# Patient Record
Sex: Male | Born: 1945 | Race: Black or African American | Hispanic: No | Smoking: Former smoker
Health system: Southern US, Community
[De-identification: ages and names within clinical notes are randomized; demographics above are authoritative.]

## PROBLEM LIST (undated history)

## (undated) DIAGNOSIS — N529 Male erectile dysfunction, unspecified: Secondary | ICD-10-CM

## (undated) DIAGNOSIS — T07XXXA Unspecified multiple injuries, initial encounter: Secondary | ICD-10-CM

## (undated) DIAGNOSIS — I428 Other cardiomyopathies: Secondary | ICD-10-CM

## (undated) DIAGNOSIS — I509 Heart failure, unspecified: Secondary | ICD-10-CM

## (undated) DIAGNOSIS — M1710 Unilateral primary osteoarthritis, unspecified knee: Secondary | ICD-10-CM

## (undated) DIAGNOSIS — M25559 Pain in unspecified hip: Secondary | ICD-10-CM

## (undated) DIAGNOSIS — E785 Hyperlipidemia, unspecified: Secondary | ICD-10-CM

## (undated) DIAGNOSIS — E119 Type 2 diabetes mellitus without complications: Secondary | ICD-10-CM

## (undated) DIAGNOSIS — I10 Essential (primary) hypertension: Secondary | ICD-10-CM

## (undated) DIAGNOSIS — I739 Peripheral vascular disease, unspecified: Secondary | ICD-10-CM

## (undated) DIAGNOSIS — F411 Generalized anxiety disorder: Secondary | ICD-10-CM

## (undated) DIAGNOSIS — I5022 Chronic systolic (congestive) heart failure: Secondary | ICD-10-CM

## (undated) HISTORY — DX: Pain in unspecified hip: M25.559

## (undated) HISTORY — DX: Hyperlipidemia, unspecified: E78.5

## (undated) HISTORY — DX: Type 2 diabetes mellitus without complications: E11.9

## (undated) HISTORY — DX: Unilateral primary osteoarthritis, unspecified knee: M17.10

## (undated) HISTORY — DX: Peripheral vascular disease, unspecified: I73.9

## (undated) HISTORY — PX: OTHER SURGICAL HISTORY: SHX169

## (undated) HISTORY — DX: Essential (primary) hypertension: I10

## (undated) HISTORY — DX: Generalized anxiety disorder: F41.1

## (undated) HISTORY — DX: Chronic systolic (congestive) heart failure: I50.22

## (undated) HISTORY — DX: Male erectile dysfunction, unspecified: N52.9

## (undated) HISTORY — DX: Other cardiomyopathies: I42.8

---

## 2003-09-03 ENCOUNTER — Emergency Department (HOSPITAL_COMMUNITY): Admission: EM | Admit: 2003-09-03 | Discharge: 2003-09-03 | Payer: Self-pay | Admitting: Emergency Medicine

## 2007-04-09 ENCOUNTER — Emergency Department (HOSPITAL_COMMUNITY): Admission: EM | Admit: 2007-04-09 | Discharge: 2007-04-09 | Payer: Self-pay | Admitting: Emergency Medicine

## 2008-10-08 ENCOUNTER — Ambulatory Visit: Payer: Self-pay | Admitting: Internal Medicine

## 2008-10-08 DIAGNOSIS — M1710 Unilateral primary osteoarthritis, unspecified knee: Secondary | ICD-10-CM

## 2008-10-08 DIAGNOSIS — I10 Essential (primary) hypertension: Secondary | ICD-10-CM

## 2008-10-08 DIAGNOSIS — R5383 Other fatigue: Secondary | ICD-10-CM

## 2008-10-08 DIAGNOSIS — E739 Lactose intolerance, unspecified: Secondary | ICD-10-CM

## 2008-10-08 DIAGNOSIS — IMO0002 Reserved for concepts with insufficient information to code with codable children: Secondary | ICD-10-CM

## 2008-10-08 DIAGNOSIS — R5381 Other malaise: Secondary | ICD-10-CM

## 2008-10-08 HISTORY — DX: Essential (primary) hypertension: I10

## 2008-10-08 HISTORY — DX: Reserved for concepts with insufficient information to code with codable children: IMO0002

## 2008-11-07 ENCOUNTER — Ambulatory Visit: Payer: Self-pay | Admitting: Internal Medicine

## 2008-11-07 DIAGNOSIS — E119 Type 2 diabetes mellitus without complications: Secondary | ICD-10-CM

## 2008-11-07 DIAGNOSIS — E785 Hyperlipidemia, unspecified: Secondary | ICD-10-CM

## 2008-11-07 DIAGNOSIS — N529 Male erectile dysfunction, unspecified: Secondary | ICD-10-CM | POA: Insufficient documentation

## 2008-11-07 HISTORY — DX: Hyperlipidemia, unspecified: E78.5

## 2008-11-07 HISTORY — DX: Male erectile dysfunction, unspecified: N52.9

## 2008-11-07 HISTORY — DX: Type 2 diabetes mellitus without complications: E11.9

## 2008-11-12 ENCOUNTER — Ambulatory Visit: Payer: Self-pay | Admitting: Gastroenterology

## 2008-11-23 ENCOUNTER — Ambulatory Visit: Payer: Self-pay | Admitting: Gastroenterology

## 2008-11-23 ENCOUNTER — Encounter: Payer: Self-pay | Admitting: Internal Medicine

## 2008-11-23 ENCOUNTER — Encounter: Payer: Self-pay | Admitting: Gastroenterology

## 2008-11-23 LAB — HM COLONOSCOPY

## 2008-11-27 ENCOUNTER — Encounter: Payer: Self-pay | Admitting: Gastroenterology

## 2009-02-04 ENCOUNTER — Ambulatory Visit: Payer: Self-pay | Admitting: Internal Medicine

## 2009-02-04 DIAGNOSIS — F411 Generalized anxiety disorder: Secondary | ICD-10-CM | POA: Insufficient documentation

## 2009-02-04 DIAGNOSIS — M79609 Pain in unspecified limb: Secondary | ICD-10-CM

## 2009-02-04 HISTORY — DX: Generalized anxiety disorder: F41.1

## 2009-05-08 ENCOUNTER — Ambulatory Visit: Payer: Self-pay | Admitting: Internal Medicine

## 2009-05-08 LAB — CONVERTED CEMR LAB
BUN: 17 mg/dL (ref 6–23)
Cholesterol: 211 mg/dL — ABNORMAL HIGH (ref 0–200)
GFR calc non Af Amer: 108.69 mL/min (ref >60)
Glucose, Bld: 114 mg/dL — ABNORMAL HIGH (ref 70–99)
HDL: 35.5 mg/dL — ABNORMAL LOW (ref >39.00)
Hgb A1c MFr Bld: 5.9 % (ref 4.6–6.5)
Potassium: 4.8 meq/L (ref 3.5–5.1)
Sodium: 139 meq/L (ref 135–145)
Total CHOL/HDL Ratio: 6
VLDL: 28.2 mg/dL (ref 0.0–40.0)

## 2009-11-06 ENCOUNTER — Ambulatory Visit: Payer: Self-pay | Admitting: Internal Medicine

## 2009-11-06 DIAGNOSIS — M25559 Pain in unspecified hip: Secondary | ICD-10-CM

## 2009-11-06 HISTORY — DX: Pain in unspecified hip: M25.559

## 2010-05-14 ENCOUNTER — Ambulatory Visit: Admit: 2010-05-14 | Payer: Self-pay | Admitting: Internal Medicine

## 2010-05-18 LAB — CONVERTED CEMR LAB
ALT: 50 units/L (ref 0–53)
Albumin: 3.9 g/dL (ref 3.5–5.2)
Alkaline Phosphatase: 81 units/L (ref 39–117)
Basophils Absolute: 0 10*3/uL (ref 0.0–0.1)
Basophils Relative: 0 % (ref 0.0–3.0)
Bilirubin, Direct: 0.1 mg/dL (ref 0.0–0.3)
CO2: 28 meq/L (ref 19–32)
Chloride: 105 meq/L (ref 96–112)
Cholesterol: 195 mg/dL (ref 0–200)
Creatinine, Ser: 0.9 mg/dL (ref 0.4–1.5)
GFR calc non Af Amer: 108.88 mL/min (ref >60)
HCT: 42.8 % (ref 39.0–52.0)
Hemoglobin, Urine: NEGATIVE
Hemoglobin: 15.1 g/dL (ref 13.0–17.0)
LDL Cholesterol: 115 mg/dL — ABNORMAL HIGH (ref 0–99)
Leukocytes, UA: NEGATIVE
Lymphs Abs: 1.7 10*3/uL (ref 0.7–4.0)
MCHC: 35.3 g/dL (ref 30.0–36.0)
Microalb Creat Ratio: 11.3 mg/g (ref 0.0–30.0)
Microalb, Ur: 1.6 mg/dL (ref 0.0–1.9)
Neutro Abs: 2.9 10*3/uL (ref 1.4–7.7)
Nitrite: NEGATIVE
PSA: 1.96 ng/mL (ref 0.10–4.00)
Platelets: 203 10*3/uL (ref 150.0–400.0)
Potassium: 4.7 meq/L (ref 3.5–5.1)
RDW: 13.2 % (ref 11.5–14.6)
TSH: 1.08 microintl units/mL (ref 0.35–5.50)
Total Bilirubin: 1 mg/dL (ref 0.3–1.2)
Total CHOL/HDL Ratio: 5
Total Protein: 7.1 g/dL (ref 6.0–8.3)
Urine Glucose: NEGATIVE mg/dL
Urobilinogen, UA: 0.2 (ref 0.0–1.0)
VLDL: 39.4 mg/dL (ref 0.0–40.0)
Vitamin B-12: 329 pg/mL (ref 211–911)

## 2010-05-22 NOTE — Assessment & Plan Note (Signed)
Summary: 3 mo rov /nws   Vital Signs:  Patient profile:   65 year old male Height:      71 inches Weight:      224 pounds BMI:     31.35 O2 Sat:      96 % on Room air Temp:     98 degrees F oral Pulse rate:   73 / minute BP sitting:   140 / 90  (left arm) Cuff size:   large  Vitals Entered ByMarland Kitchen Zella Ball Ewing (May 08, 2009 9:51 AM)  O2 Flow:  Room air  Preventive Care Screening     decllned flu shot  CC: 3 Mo ROV/RE   CC:  3 Mo ROV/RE.  History of Present Illness: overall doing well, wt is down from 235 to 224 with better diet and less ETOH - now down to 2  beers per day;  Pt denies CP, sob, doe, wheezing, orthopnea, pnd, worsening LE edema, palps, dizziness or syncope   Pt denies new neuro symptoms such as headache, facial or extremity weakness   Pt denies polydipsia, polyuria, or low sugar symptoms such as shakiness improved with eating.  Overall good compliance with meds, trying to follow low chol, DM diet, wt stable, little excercise however .  He c/o viagra costing too much and would like the rx with the 3 free cialis today to try.   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 1 By Mouth Once Daily 2)  Aspir-Low 81 Mg Tbec (Aspirin) .Marland Kitchen.. 1 By  Mouth Once Daily 3)  Glucosamine-Chondroitin-Msm 500-250-250 Mg Caps (Glucosamine-Chondroitin-Msm) .... 3 By Mouth Once Daily 4)  Viagra 100 Mg Tabs (Sildenafil Citrate) .Marland Kitchen.. 1 By Mouth Every Other Day As Needed 5)  Losartan Potassium 100 Mg Tabs (Losartan Potassium) .Marland Kitchen.. 1 By Mouth Once Daily 6)  Citalopram Hydrobromide 10 Mg Tabs (Citalopram Hydrobromide) .Marland Kitchen.. 1 By Mouth Once Daily  Current Medications (verified): 1)  Amlodipine Besylate 10 Mg Tabs (Amlodipine Besylate) .Marland Kitchen.. 1 By Mouth Once Daily 2)  Aspir-Low 81 Mg Tbec (Aspirin) .Marland Kitchen.. 1 By Mouth Once Daily 3)  Glucosamine-Chondroitin-Msm 500-250-250 Mg Caps (Glucosamine-Chondroitin-Msm) .... 3 By Mouth Once Daily 4)  Cialis 20 Mg Tabs (Tadalafil) .Marland Kitchen.. 1 By Mouth Every Other Day As Needed 5)  Losartan Potassium 100 Mg Tabs (Losartan Potassium) .Marland Kitchen.. 1 By Mouth Once Daily 6)  Citalopram Hydrobromide 10 Mg Tabs (Citalopram Hydrobromide) .Marland Kitchen.. 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 Alcohol use-yes - weekends - up to half case 10th grade education - poor reading and writing ability incarcerated for 6 mo in 2009  Risk Factors: Smoking Status: quit (10/08/2008)  Review of Systems  The patient denies anorexia, fever, weight gain, vision loss, decreased hearing, hoarseness, chest pain, syncope, dyspnea on exertion, peripheral edema, prolonged cough, headaches, hemoptysis, abdominal pain, melena, hematochezia, severe indigestion/heartburn, hematuria,  incontinence, muscle weakness, suspicious skin lesions, transient blindness, difficulty walking, depression, unusual weight change, abnormal bleeding, enlarged lymph nodes, and angioedema.         all otherwise negative per pt - 12 system review done  Physical Exam  General:  alert and overweight-appearing.   Head:  normocephalic and atraumatic.   Eyes:  vision grossly intact, pupils equal, and pupils round.   Ears:  R ear normal and L ear normal.   Nose:  no external deformity and no nasal discharge.   Mouth:  no gingival abnormalities and pharynx pink and moist.   Neck:  supple and no masses.   Lungs:  normal respiratory effort and normal breath sounds.   Heart:  normal rate and regular rhythm.   Abdomen:  soft, non-tender, and normal bowel sounds.   Msk:  no joint tenderness and no joint swelling.   Extremities:  no edema, no erythema  Neurologic:  cranial nerves II-XII intact and strength normal in all extremities.     Impression & Recommendations:  Problem # 1:  Preventive Health Care (ICD-V70.0) Overall doing well, updated the age appropriate counseling and education;  routine health screening/prevention reviewed and done as appropriate unless declined, immunizations up to date or declined, diet counseling done if overweight, urged to quit smoking if smokes , most recent labs reviewed and current ordered if appropriate, ecg reviewed or declined  Problem # 2:  DIABETES MELLITUS, TYPE II (ICD-250.00)  His updated medication list for this problem includes:    Aspir-low 81 Mg Tbec (Aspirin) .Marland Kitchen... 1 by mouth once daily    Losartan Potassium 100 Mg Tabs (Losartan potassium) .Marland Kitchen... 1 by mouth once daily  Orders: TLB-BMP (Basic Metabolic Panel-BMET) (80048-METABOL) TLB-A1C / Hgb A1C (Glycohemoglobin) (83036-A1C) TLB-Lipid Panel (80061-LIPID)  Labs Reviewed: Creat: 0.9 (10/08/2008)    Reviewed HgBA1c results: 6.5 (10/08/2008) stable overall by hx and exam, ok to continue meds/tx  as is, does not require OHA at this time, Pt to cont DM diet, excercise, wt loss efforts; to check labs today   Problem # 3:  HYPERTENSION (ICD-401.9)  His updated medication list for this problem includes:    Amlodipine Besylate 10 Mg Tabs (Amlodipine besylate) .Marland Kitchen... 1 by mouth once daily    Losartan Potassium 100 Mg Tabs (Losartan potassium) .Marland Kitchen... 1 by mouth once daily  BP today: 140/90 Prior BP: 156/86 (02/04/2009)  Labs Reviewed: K+: 4.7 (10/08/2008) Creat: : 0.9 (10/08/2008)   Chol: 195 (10/08/2008)   HDL: 40.40 (10/08/2008)   LDL: 115 (10/08/2008)   TG: 197.0 (10/08/2008) stable overall by hx and exam, ok to continue meds/tx as is   Problem # 4:  HYPERLIPIDEMIA (ICD-272.4) stable overall by hx and exam, ok to continue meds/tx as is ; Pt to continue diet efforts, ; to check labs - goal LDL less than 70  Labs Reviewed: SGOT: 45 (10/08/2008)   SGPT: 50 (10/08/2008)   HDL:40.40 (10/08/2008)  LDL:115 (10/08/2008)  Chol:195 (10/08/2008)  Trig:197.0 (10/08/2008)  Problem # 5:  ERECTILE DYSFUNCTION, ORGANIC (ICD-607.84)  His updated  medication list for this problem includes:    Cialis 20 Mg Tabs (Tadalafil) .Marland Kitchen... 1 by mouth every other day as needed treat as above, f/u any worsening signs or symptoms   Complete Medication List: 1)  Amlodipine Besylate 10 Mg Tabs (Amlodipine besylate) .Marland Kitchen.. 1 by mouth once daily 2)  Aspir-low 81 Mg Tbec (Aspirin) .Marland Kitchen.. 1 by mouth once daily 3)  Glucosamine-chondroitin-msm 500-250-250 Mg Caps (Glucosamine-chondroitin-msm) .... 3 by mouth once daily 4)  Cialis 20 Mg Tabs (Tadalafil) .Marland Kitchen.. 1 by mouth every other day as needed 5)  Losartan Potassium 100 Mg Tabs (Losartan potassium) .Marland Kitchen.. 1 by mouth once daily 6)  Citalopram Hydrobromide 10 Mg Tabs (Citalopram hydrobromide) .Marland Kitchen.. 1 by mouth once daily  Patient Instructions: 1)  Please take all new medications as prescribed 2)  Continue all previous medications as before this visit  3)  Please go to the Lab  in the basement for your blood and/or urine tests today 4)  Please schedule a follow-up appointment in 6 months. Prescriptions: CIALIS 20 MG TABS (TADALAFIL) 1 by mouth every other day as needed  #3 x 11   Entered and Authorized by:   [name] MD   Signed by:   [name] MD on 05/08/2009   Method used:   Print then Give to Patient   RxID:   [id number]

## 2010-05-22 NOTE — Assessment & Plan Note (Signed)
Summary: 6 MO ROV /NWS   Vital Signs:  Patient profile:   65 year old male Height:      73 inches Weight:      229.50 pounds BMI:     30.39 O2 Sat:      97 % on Room air Temp:     98.2 degrees F oral Pulse rate:   72 / minute BP sitting:   138 / 80  (left arm) Cuff size:   large  Vitals Entered By: Zella Ball Ewing CMA (AAMA) (November 06, 2009 1:28 PM)  O2 Flow:  Room air  CC: 6 month ROV/RE   CC:  6 month ROV/RE.  History of Present Illness: here to f/u in general, not taking the simvastatin  - just wasnt sure about it;  does c/o some right hip area stiffness and pain intermittent to sit more than 5 min such as on the commode;  no back pain, or LE pian/weak/numb, bowel or bladder changes, or fall or gait change or injury.  Pt denies CP, sob, doe, wheezing, orthopnea, pnd, worsening LE edema, palps, dizziness or syncope Pt denies new neuro symptoms such as headache, facial or extremity weakness  No fever, wt loss, night sweats, loss of appetite or other constitutional symptoms  gained 5 lbs in the past 6 mo due to less excercise.  Pt denies polydipsia, polyuria, or low sugar symptoms such as shakiness improved with eating.  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 1 By Mouth Once Daily 2)  Aspir-Low 81 Mg Tbec (Aspirin) .Marland Kitchen.. 1 By Mouth Once Daily 3)  Glucosamine-Chondroitin-Msm 500-250-250 Mg  Caps (Glucosamine-Chondroitin-Msm) .... 3 By Mouth Once Daily 4)  Cialis 20 Mg Tabs (Tadalafil) .Marland Kitchen.. 1 By Mouth Every Other Day As Needed 5)  Losartan Potassium 100 Mg Tabs (Losartan Potassium) .Marland Kitchen.. 1 By Mouth Once Daily 6)  Citalopram Hydrobromide 10 Mg Tabs (Citalopram Hydrobromide) .Marland Kitchen.. 1 By Mouth Once Daily 7)  Simvastatin 40 Mg Tabs (Simvastatin) .Marland Kitchen.. 1po Once Daily  Current Medications (verified): 1)  Amlodipine Besylate 10 Mg Tabs (Amlodipine Besylate) .Marland Kitchen.. 1 By Mouth Once Daily 2)  Aspir-Low 81 Mg Tbec (Aspirin) .Marland Kitchen.. 1 By Mouth Once Daily 3)  Glucosamine-Chondroitin-Msm 500-250-250 Mg Caps (Glucosamine-Chondroitin-Msm) .... 3 By Mouth Once Daily 4)  Cialis 20 Mg Tabs (Tadalafil) .Marland Kitchen.. 1 By Mouth Every Other Day As Needed 5)  Losartan Potassium 100 Mg Tabs (Losartan Potassium) .Marland Kitchen.. 1 By Mouth Once Daily 6)  Citalopram Hydrobromide 10 Mg Tabs (Citalopram Hydrobromide) .Marland Kitchen.. 1 By Mouth Once Daily 7)  Simvastatin 40 Mg Tabs (Simvastatin) .Marland Kitchen.. 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   Head:  normocephalic and atraumatic.   Eyes:  vision grossly intact, pupils equal, and pupils round.   Ears:  R ear normal and L ear normal.   Nose:  no external deformity and no nasal  discharge.   Mouth:  no gingival abnormalities and pharynx pink and moist.  , poor dentition Neck:  supple and no masses.   Lungs:  normal respiratory effort and normal breath sounds.   Heart:  normal rate and regular rhythm.   Abdomen:  soft, non-tender, and normal bowel sounds.   Msk:  no tender over greater trochanter or lumbar area on the right, has FROM of right hip Extremities:  no edema, no erythema  Neurologic:  vision poor today, not addressed formally today, gait normal   Impression & Recommendations:  Problem # 1:  HYPERLIPIDEMIA (ICD-272.4)  His updated medication list for this problem includes:    Simvastatin 40 Mg Tabs (Simvastatin) .Marland Kitchen... 1po once daily  Labs Reviewed: SGOT: 45 (10/08/2008)   SGPT: 50 (10/08/2008)   HDL:35.50 (05/08/2009), 40.40 (10/08/2008)  LDL:115 (10/08/2008)  Chol:211 (05/08/2009), 195 (10/08/2008)  Trig:141.0 (05/08/2009), 197.0 (10/08/2008) d/w pt - not taking med, encouraged compliance, pt states will take - for new rx today  Problem # 2:  HIP PAIN, RIGHT (ICD-719.45)  His updated medication list for this problem includes:    Aspir-low 81 Mg Tbec (Aspirin) .Marland Kitchen... 1 by mouth once daily exam benign, pt declines film - Continue all previous medications as before this visit  - tylenol as needed   Problem # 3:  DIABETES MELLITUS, TYPE II (ICD-250.00)  His updated medication list for this problem includes:    Aspir-low 81 Mg Tbec (Aspirin) .Marland Kitchen... 1 by mouth once daily    Losartan Potassium 100 Mg Tabs (Losartan potassium) .Marland Kitchen... 1 by mouth once daily  Labs Reviewed: Creat: 0.9 (05/08/2009)    Reviewed HgBA1c results: 5.9 (05/08/2009)  6.5 (10/08/2008) stable overall by hx and exam, ok to continue meds/tx as is - no med needed at this time  Problem # 4:  HYPERTENSION (ICD-401.9)  His updated medication list for this problem includes:    Amlodipine Besylate 10 Mg Tabs (Amlodipine besylate) .Marland Kitchen... 1 by mouth once daily    Losartan Potassium 100  Mg Tabs (Losartan potassium) .Marland Kitchen... 1 by mouth once daily  BP today: 138/80 Prior BP: 140/90 (05/08/2009)  Labs Reviewed: K+: 4.8 (05/08/2009) Creat: : 0.9 (05/08/2009)   Chol: 211 (05/08/2009)   HDL: 35.50 (05/08/2009)   LDL: 115 (10/08/2008)   TG: 141.0 (05/08/2009) stable overall by hx and exam, ok to continue meds/tx as is   Complete Medication List: 1)  Amlodipine Besylate 10 Mg Tabs (Amlodipine besylate) .Marland Kitchen.. 1 by mouth once daily 2)  Aspir-low 81 Mg Tbec (Aspirin) .Marland Kitchen.. 1 by mouth once daily 3)  Glucosamine-chondroitin-msm 500-250-250 Mg Caps (Glucosamine-chondroitin-msm) .... 3 by mouth once daily 4)  Cialis 20 Mg Tabs (Tadalafil) .Marland Kitchen.. 1 by mouth every other day as needed 5)  Losartan Potassium 100 Mg Tabs (Losartan potassium) .Marland Kitchen.. 1 by mouth once daily 6)  Citalopram Hydrobromide 10  Mg Tabs (Citalopram hydrobromide) .Marland Kitchen.. 1 by mouth once daily 7)  Simvastatin 40 Mg Tabs (Simvastatin) .Marland Kitchen.. 1po once daily  Patient Instructions: 1)  Please take all new medications as prescribed - the new medication for cholesterol 2)  Please make appt with your eye doctor at least once per year, and your dentist every 6 months 3)  Continue all previous medications as before this visit  4)  Please schedule a follow-up appointment in 6 months for your "yearly medicare exam", or sooner if needed Prescriptions: SIMVASTATIN 40 MG TABS (SIMVASTATIN) 1po once daily  #90 x 3   Entered and Authorized by:   [name] MD   Signed by:   [name] MD on 11/06/2009   Method used:   Faxed to ...       [facility] (retail)       [address]       [place], [place]  [address]       Ph: [phone]       Fax: [phone]   RxID:   [id number]

## 2010-06-10 ENCOUNTER — Emergency Department (HOSPITAL_COMMUNITY): Payer: Medicare Other

## 2010-06-10 ENCOUNTER — Emergency Department (HOSPITAL_COMMUNITY)
Admission: EM | Admit: 2010-06-10 | Discharge: 2010-06-10 | Disposition: A | Discharge status: Home / Self Care | Payer: Medicare Other | Attending: Emergency Medicine | Admitting: Emergency Medicine

## 2010-06-10 DIAGNOSIS — M47812 Spondylosis without myelopathy or radiculopathy, cervical region: Secondary | ICD-10-CM | POA: Insufficient documentation

## 2010-06-10 DIAGNOSIS — W1809XA Striking against other object with subsequent fall, initial encounter: Secondary | ICD-10-CM | POA: Insufficient documentation

## 2010-06-10 DIAGNOSIS — R079 Chest pain, unspecified: Secondary | ICD-10-CM | POA: Insufficient documentation

## 2010-06-10 DIAGNOSIS — R51 Headache: Secondary | ICD-10-CM | POA: Insufficient documentation

## 2010-06-10 DIAGNOSIS — M25519 Pain in unspecified shoulder: Secondary | ICD-10-CM | POA: Insufficient documentation

## 2010-06-10 DIAGNOSIS — I4949 Other premature depolarization: Secondary | ICD-10-CM | POA: Insufficient documentation

## 2010-06-10 DIAGNOSIS — Y92009 Unspecified place in unspecified non-institutional (private) residence as the place of occurrence of the external cause: Secondary | ICD-10-CM | POA: Insufficient documentation

## 2010-06-10 DIAGNOSIS — IMO0002 Reserved for concepts with insufficient information to code with codable children: Secondary | ICD-10-CM | POA: Insufficient documentation

## 2010-06-10 LAB — CBC
Hemoglobin: 14.7 g/dL (ref 13.0–17.0)
MCV: 99.6 fL (ref 78.0–100.0)
RBC: 4.5 MIL/uL (ref 4.22–5.81)
WBC: 5.7 10*3/uL (ref 4.0–10.5)

## 2010-06-10 LAB — URINALYSIS, ROUTINE W REFLEX MICROSCOPIC
Protein, ur: NEGATIVE mg/dL
Specific Gravity, Urine: 1.026 (ref 1.005–1.030)
Urobilinogen, UA: 2 mg/dL — ABNORMAL HIGH (ref 0.0–1.0)
pH: 6 (ref 5.0–8.0)

## 2010-06-10 LAB — DIFFERENTIAL
Basophils Absolute: 0 10*3/uL (ref 0.0–0.1)
Eosinophils Absolute: 0.1 10*3/uL (ref 0.0–0.7)
Eosinophils Relative: 1 % (ref 0–5)
Neutro Abs: 2.9 10*3/uL (ref 1.7–7.7)

## 2010-06-10 LAB — COMPREHENSIVE METABOLIC PANEL
ALT: 33 U/L (ref 0–53)
BUN: 16 mg/dL (ref 6–23)
CO2: 27 mEq/L (ref 19–32)
Creatinine, Ser: 1.02 mg/dL (ref 0.4–1.5)
GFR calc Af Amer: >60 mL/min (ref >60)
GFR calc non Af Amer: >60 mL/min (ref >60)
Glucose, Bld: 112 mg/dL — ABNORMAL HIGH (ref 70–99)
Sodium: 138 mEq/L (ref 135–145)
Total Bilirubin: 1.3 mg/dL — ABNORMAL HIGH (ref 0.3–1.2)

## 2010-06-10 LAB — ETHANOL: Alcohol, Ethyl (B): <5 mg/dL (ref 0–10)

## 2010-08-17 ENCOUNTER — Encounter: Payer: Self-pay | Admitting: Internal Medicine

## 2010-08-17 DIAGNOSIS — R7302 Impaired glucose tolerance (oral): Secondary | ICD-10-CM | POA: Insufficient documentation

## 2010-08-17 DIAGNOSIS — Z0000 Encounter for general adult medical examination without abnormal findings: Secondary | ICD-10-CM | POA: Insufficient documentation

## 2010-08-20 ENCOUNTER — Ambulatory Visit (INDEPENDENT_AMBULATORY_CARE_PROVIDER_SITE_OTHER): Payer: Medicare Other | Admitting: Internal Medicine

## 2010-08-20 ENCOUNTER — Encounter: Payer: Self-pay | Admitting: Internal Medicine

## 2010-08-20 ENCOUNTER — Other Ambulatory Visit (INDEPENDENT_AMBULATORY_CARE_PROVIDER_SITE_OTHER): Payer: Medicare Other | Admitting: Internal Medicine

## 2010-08-20 ENCOUNTER — Other Ambulatory Visit (INDEPENDENT_AMBULATORY_CARE_PROVIDER_SITE_OTHER): Payer: Medicare Other

## 2010-08-20 VITALS — BP 134/82 | HR 88 | Temp 98.9°F | Ht 73.0 in | Wt 225.5 lb

## 2010-08-20 DIAGNOSIS — M25559 Pain in unspecified hip: Secondary | ICD-10-CM

## 2010-08-20 DIAGNOSIS — I10 Essential (primary) hypertension: Secondary | ICD-10-CM

## 2010-08-20 DIAGNOSIS — E119 Type 2 diabetes mellitus without complications: Secondary | ICD-10-CM

## 2010-08-20 DIAGNOSIS — E785 Hyperlipidemia, unspecified: Secondary | ICD-10-CM

## 2010-08-20 DIAGNOSIS — N320 Bladder-neck obstruction: Secondary | ICD-10-CM

## 2010-08-20 DIAGNOSIS — R5383 Other fatigue: Secondary | ICD-10-CM

## 2010-08-20 DIAGNOSIS — F411 Generalized anxiety disorder: Secondary | ICD-10-CM

## 2010-08-20 DIAGNOSIS — R5381 Other malaise: Secondary | ICD-10-CM

## 2010-08-20 DIAGNOSIS — Z0000 Encounter for general adult medical examination without abnormal findings: Secondary | ICD-10-CM

## 2010-08-20 LAB — LIPID PANEL
Cholesterol: 205 mg/dL — ABNORMAL HIGH (ref 0–200)
HDL: 35.4 mg/dL — ABNORMAL LOW (ref >39.00)
Total CHOL/HDL Ratio: 6
Triglycerides: 133 mg/dL (ref 0.0–149.0)
VLDL: 26.6 mg/dL (ref 0.0–40.0)

## 2010-08-20 LAB — LDL CHOLESTEROL, DIRECT: Direct LDL: 145.5 mg/dL

## 2010-08-20 LAB — HEMOGLOBIN A1C: Hgb A1c MFr Bld: 6.3 % (ref 4.6–6.5)

## 2010-08-20 LAB — CBC WITH DIFFERENTIAL/PLATELET
Basophils Relative: 0.8 % (ref 0.0–3.0)
Eosinophils Absolute: 0.1 10*3/uL (ref 0.0–0.7)
HCT: 45.3 % (ref 39.0–52.0)
Hemoglobin: 15.7 g/dL (ref 13.0–17.0)
Lymphocytes Relative: 38.4 % (ref 12.0–46.0)
MCHC: 34.6 g/dL (ref 30.0–36.0)
MCV: 97.5 fl (ref 78.0–100.0)
Neutro Abs: 2.4 10*3/uL (ref 1.4–7.7)
RBC: 4.65 Mil/uL (ref 4.22–5.81)

## 2010-08-20 LAB — HEPATIC FUNCTION PANEL
Bilirubin, Direct: 0.2 mg/dL (ref 0.0–0.3)
Total Bilirubin: 0.9 mg/dL (ref 0.3–1.2)
Total Protein: 6.8 g/dL (ref 6.0–8.3)

## 2010-08-20 LAB — BASIC METABOLIC PANEL
Calcium: 9 mg/dL (ref 8.4–10.5)
Creatinine, Ser: 0.9 mg/dL (ref 0.4–1.5)
GFR: 108.26 mL/min (ref >60.00)

## 2010-08-20 LAB — MICROALBUMIN / CREATININE URINE RATIO: Microalb, Ur: 1 mg/dL (ref 0.0–1.9)

## 2010-08-20 MED ORDER — TRAMADOL HCL 50 MG PO TABS: 50.0000 mg | ORAL_TABLET | Freq: Four times a day (QID) | ORAL | Status: AC | PRN

## 2010-08-20 NOTE — Assessment & Plan Note (Signed)
asympt - to check a1c today,  to f/u any worsening symptoms or concerns

## 2010-08-20 NOTE — Assessment & Plan Note (Signed)
asympt - stable overall by hx and exam, most recent lab reviewed with pt, and pt to continue medical treatment as before  Lab Results  Component Value Date   HGBA1C 5.9 05/08/2009    To check a1c today, f/u 6 mo, cont diet, wt loss efforts

## 2010-08-20 NOTE — Progress Notes (Signed)
Subjective:    Patient ID: Jacob Pittman, male    DOB: October 24, 1945, 65 y.o.   MRN: [id number]  HPI  Here to f/u after being seen in the ER, right hip with chronic pain, worse in the am and afternoon, alleve not working, leg went out and just fell x 1 in Feb 2012 (also had had 2 drinks ETOH) and hit the wall on the way down, hip xray ok on the ER, abrasion to the nose now healed and no other injuries.  No fall since then . Pt denies chest pain, increased sob or doe, wheezing, orthopnea, PND, increased LE swelling, palpitations, dizziness or syncope.  Pt denies new neurological symptoms such as new headache, or facial or extremity weakness or numbness   Pt denies polydipsia, polyuria, htough does have nocturia 2-3 times at night.  Pt states overall good compliance with meds, trying to follow lower cholesterol diet, wt overall stable but little exercise however. Overall good compliance with treatment, and good medicine tolerability.  Denies worsening depressive symptoms, suicidal ideation, or panic.  Not taking the cozarr for some resona , but BP in the ER and here ok.  Does have sense of ongoing fatigue, but denies signficant hypersomnolence, only with mowing the grass and walks a lot.   Past Medical History  Diagnosis Date  . DIABETES MELLITUS, TYPE II 11/07/2008  . GLUCOSE INTOLERANCE 10/08/2008  . HYPERLIPIDEMIA 11/07/2008  . ANXIETY 02/04/2009  . HYPERTENSION 10/08/2008  . 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 OSTEOARTHRITIS, KNEE, RIGHT 10/08/2008  . HIP PAIN, RIGHT 11/06/2009  . TOE PAIN 02/04/2009  . FATIGUE 10/08/2008  . Impaired glucose tolerance 08/17/2010   Past Surgical History  Procedure Date  . S/p left broken arm with pin     1980's    reports that he has quit smoking. He does not have any smokeless tobacco history on file. He reports that he drinks alcohol. His drug history not on file. family history includes Diabetes in his mother and Hypertension in his mother. No Known  Allergies Current Outpatient Prescriptions on File Prior to Visit  Medication Sig Dispense Refill  . amLODipine (NORVASC) 10 MG tablet Take 10 mg by mouth daily.        Marland Kitchen aspirin 81 MG EC tablet Take 81 mg by mouth daily.        . citalopram (CELEXA) 10 MG tablet Take 10 mg by mouth daily.        . simvastatin (ZOCOR) 40 MG tablet Take 40 mg by mouth daily.        . Glucosamine-Chondroitin 500-250 MG CAPS Take by mouth 3 (three) times daily.        Marland Kitchen losartan (COZAAR) 100 MG tablet Take 100 mg by mouth daily.        . tadalafil (CIALIS) 20 MG tablet 1 by mouth every other day as needed           Review of Systems Review of Systems  Constitutional: Negative for diaphoresis and unexpected weight change.  HENT: Negative for drooling and tinnitus.   Eyes: Negative for photophobia and visual disturbance.  Respiratory: Negative for choking and stridor.   Gastrointestinal: Negative for vomiting and blood in stool.  Genitourinary: Negative for hematuria and decreased urine volume.  Musculoskeletal: Negative for gait problem.  Skin: Negative for color change and wound.  Neurological: Negative for tremors and numbness.  Psychiatric/Behavioral: Negative for decreased concentration. The patient is not hyperactive.       Objective:  Physical Exam BP 134/82  Pulse 88  Temp(Src) 98.9 F (37.2 C) (Oral)  Ht 6\' 1"  (1.854 m)  Wt 225 lb 8 oz (102.286 kg)  BMI 29.75 kg/m2  SpO2 97% Physical Exam  VS noted Constitutional: Pt appears well-developed and well-nourished.  HENT: Head: Normocephalic.  Right Ear: External ear normal.  Left Ear: External ear normal.  Eyes: Conjunctivae and EOM are normal. Pupils are equal, round, and reactive to light.  Neck: Normal range of motion. Neck supple.  Cardiovascular: Normal rate and regular rhythm.   Pulmonary/Chest: Effort normal and breath sounds normal.  Abd:  Soft, NT, non-distended, + BS Neurological: Pt is alert. No cranial nerve deficit.  Skin:  Skin is warm. No erythema.  Psychiatric: Pt behavior is normal. Thought content normal.  Right lateral hip with mod tender over greater trochanter       Assessment & Plan:

## 2010-08-20 NOTE — Assessment & Plan Note (Signed)
stable overall by hx and exam, most recent lab reviewed with pt, and pt to continue medical treatment as before  Lab Results  Component Value Date   LDLCALC 115* 10/08/2008   Goal ldl < 70

## 2010-08-20 NOTE — Assessment & Plan Note (Signed)
Also due for PSA 

## 2010-08-20 NOTE — Assessment & Plan Note (Signed)
stable overall by hx and exam, most recent lab reviewed with pt, and pt to continue medical treatment as before  BP Readings from Last 3 Encounters:  08/20/10 134/82  11/06/09 138/80  05/08/09 140/90

## 2010-08-20 NOTE — Assessment & Plan Note (Signed)
stable overall by hx and exam, and pt to continue medical treatment as before 

## 2010-08-20 NOTE — Assessment & Plan Note (Signed)
Exam c/w possible bursitis - for ortho referral, tramadol prn

## 2010-08-20 NOTE — Assessment & Plan Note (Signed)
Etiology unclear, Exam otherwise benign, to check labs as documented, follow with expectant management  Lab Results  Component Value Date   WBC 5.7 06/10/2010   HGB 14.7 06/10/2010   HCT 44.8 06/10/2010   PLT 198 06/10/2010   CHOL 211* 05/08/2009   TRIG 141.0 05/08/2009   HDL 35.50* 05/08/2009   LDLDIRECT 154.7 05/08/2009   ALT 33 06/10/2010   AST 31 06/10/2010   NA 138 06/10/2010   K 3.8 06/10/2010   CL 104 06/10/2010   CREATININE 1.02 06/10/2010   BUN 16 06/10/2010   CO2 27 06/10/2010   TSH 1.08 10/08/2008   PSA 1.96 10/08/2008   HGBA1C 5.9 05/08/2009   MICROALBUR 1.6 10/08/2008

## 2010-08-20 NOTE — Patient Instructions (Signed)
Take all new medications as prescribed - the new pain medication Continue all other medications as before Please go to LAB in the Basement for the blood and/or urine tests to be done today Please call the number on the Blue [employer] (the [employer] [facility]) for results of testing in 2-3 days You will be contacted regarding the referral for: orthopedic Please return in 6 months

## 2010-08-22 ENCOUNTER — Other Ambulatory Visit: Payer: Self-pay

## 2010-08-22 DIAGNOSIS — M25559 Pain in unspecified hip: Secondary | ICD-10-CM

## 2010-08-22 MED ORDER — AMLODIPINE BESYLATE 10 MG PO TABS: 10.0000 mg | ORAL_TABLET | Freq: Every day | ORAL | Status: DC

## 2010-08-22 MED ORDER — SIMVASTATIN 40 MG PO TABS: 40.0000 mg | ORAL_TABLET | Freq: Every day | ORAL | Status: DC

## 2010-08-22 MED ORDER — CITALOPRAM HYDROBROMIDE 10 MG PO TABS: 10.0000 mg | ORAL_TABLET | Freq: Every day | ORAL | Status: DC

## 2010-08-22 NOTE — Telephone Encounter (Signed)
Patient requesting refill on Tramadol to be sent to [facility].

## 2010-08-22 NOTE — Telephone Encounter (Signed)
Already done may 2

## 2011-02-23 ENCOUNTER — Ambulatory Visit: Payer: Medicare Other | Admitting: Internal Medicine

## 2011-03-25 ENCOUNTER — Ambulatory Visit (INDEPENDENT_AMBULATORY_CARE_PROVIDER_SITE_OTHER): Payer: Medicare Other | Admitting: Internal Medicine

## 2011-03-25 ENCOUNTER — Encounter: Payer: Self-pay | Admitting: Internal Medicine

## 2011-03-25 VITALS — BP 142/72 | HR 77 | Temp 98.7°F | Ht 73.0 in | Wt 227.0 lb

## 2011-03-25 DIAGNOSIS — E119 Type 2 diabetes mellitus without complications: Secondary | ICD-10-CM

## 2011-03-25 DIAGNOSIS — I10 Essential (primary) hypertension: Secondary | ICD-10-CM

## 2011-03-25 DIAGNOSIS — E785 Hyperlipidemia, unspecified: Secondary | ICD-10-CM

## 2011-03-25 MED ORDER — ATORVASTATIN CALCIUM 20 MG PO TABS: 20.0000 mg | ORAL_TABLET | Freq: Every day | ORAL | Status: DC

## 2011-03-25 NOTE — Assessment & Plan Note (Addendum)
Never started the zocor;  Ok at this point to start lipitor generic, cont DM/low chol diet, and f/u with labs next visit  Last ldl 145  - 6 mo ago, goal ldl < 70

## 2011-03-25 NOTE — Progress Notes (Signed)
  Subjective:    Patient ID: Jacob Pittman, male    DOB: 01-09-1946, 65 y.o.   MRN: [id number]  HPI  Here to f/u; overall doing ok,  Pt denies chest pain, increased sob or doe, wheezing, orthopnea, PND, increased LE swelling, palpitations, dizziness or syncope.  Pt denies new neurological symptoms such as new headache, or facial or extremity weakness or numbness   Pt denies polydipsia, polyuria, or low sugar symptoms such as weakness or confusion improved with po intake.  Pt states overall good compliance with meds, trying to follow lower cholesterol, diabetic diet, wt overall stable but little exercise however.  Never did start the zocor due to some confusion.   Pt denies fever, wt loss, night sweats, loss of appetite, or other constitutional symptoms  No other new compalitns today. Past Medical History  Diagnosis Date  . DIABETES MELLITUS, TYPE II 11/07/2008  . GLUCOSE INTOLERANCE 10/08/2008  . HYPERLIPIDEMIA 11/07/2008  . ANXIETY 02/04/2009  . HYPERTENSION 10/08/2008  . 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 OSTEOARTHRITIS, KNEE, RIGHT 10/08/2008  . HIP PAIN, RIGHT 11/06/2009  . TOE PAIN 02/04/2009  . FATIGUE 10/08/2008  . Impaired glucose tolerance 08/17/2010   Past Surgical History  Procedure Date  . S/p left broken arm with pin     1980's    reports that he has quit smoking. He does not have any smokeless tobacco history on file. He reports that he drinks alcohol. His drug history not on file. family history includes Diabetes in his mother and Hypertension in his mother. No Known Allergies Current Outpatient Prescriptions on File Prior to Visit  Medication Sig Dispense Refill  . amLODipine (NORVASC) 10 MG tablet Take 1 tablet (10 mg total) by mouth daily.  90 tablet  3  . aspirin 81 MG EC tablet Take 81 mg by mouth daily.        . citalopram (CELEXA) 10 MG tablet Take 1 tablet (10 mg total) by mouth daily.  90 tablet  3  . Glucosamine-Chondroitin 500-250 MG CAPS Take by mouth 3  (three) times daily.        . traMADol (ULTRAM) 50 MG tablet Take 1 tablet (50 mg total) by mouth every 6 (six) hours as needed for pain.  60 tablet  1   Review of Systems Review of Systems  Constitutional: Negative for diaphoresis and unexpected weight change.  HENT: Negative for drooling and tinnitus.   Eyes: Negative for photophobia and visual disturbance.  Respiratory: Negative for choking and stridor.   Gastrointestinal: Negative for vomiting and blood in stool.  Genitourinary: Negative for hematuria and decreased urine volume.     Objective:   Physical Exam BP 142/72  Pulse 77  Temp(Src) 98.7 F (37.1 C) (Oral)  Ht 6\' 1"  (1.854 m)  Wt 227 lb (102.967 kg)  BMI 29.95 kg/m2  SpO2 96% Physical Exam  VS noted Constitutional: Pt appears well-developed and well-nourished.  HENT: Head: Normocephalic.  Right Ear: External ear normal.  Left Ear: External ear normal.  Eyes: Conjunctivae and EOM are normal. Pupils are equal, round, and reactive to light.  Neck: Normal range of motion. Neck supple.  Cardiovascular: Normal rate and regular rhythm.   Pulmonary/Chest: Effort normal and breath sounds normal.  Neurological: Pt is alert. No cranial nerve deficit.  Skin: Skin is warm. No erythema.  Psychiatric: Pt behavior is normal. Thought content normal.     Assessment & Plan:

## 2011-03-25 NOTE — Assessment & Plan Note (Signed)
stable overall by hx and exam, most recent data reviewed with pt, and pt to continue medical treatment as before  Lab Results  Component Value Date   HGBA1C 6.3 08/20/2010

## 2011-03-25 NOTE — Assessment & Plan Note (Signed)
BP Readings from Last 3 Encounters:  03/25/11 142/72  08/20/10 134/82  11/06/09 138/80   Overall stable by hx and exam, most recent data reviewed with pt, and pt to continue medical treatment as before, f/u next visit

## 2011-03-25 NOTE — Patient Instructions (Signed)
Take all new medications as prescribed - the lipitor at 20 mg per day Continue all other medications as before You are otherwise up to date with prevention today Please return in 3 months, or sooner if needed

## 2011-06-24 ENCOUNTER — Ambulatory Visit: Payer: Medicare Other | Admitting: Internal Medicine

## 2011-09-09 ENCOUNTER — Ambulatory Visit: Payer: Medicare Other | Admitting: Internal Medicine

## 2011-09-18 ENCOUNTER — Ambulatory Visit: Payer: Medicare Other | Admitting: Internal Medicine

## 2011-09-18 DIAGNOSIS — Z0289 Encounter for other administrative examinations: Secondary | ICD-10-CM

## 2011-10-08 ENCOUNTER — Ambulatory Visit (INDEPENDENT_AMBULATORY_CARE_PROVIDER_SITE_OTHER): Payer: Medicare Other | Admitting: Internal Medicine

## 2011-10-08 ENCOUNTER — Encounter: Payer: Self-pay | Admitting: Internal Medicine

## 2011-10-08 VITALS — BP 142/70 | HR 76 | Temp 97.6°F | Ht 73.0 in | Wt 237.4 lb

## 2011-10-08 DIAGNOSIS — N320 Bladder-neck obstruction: Secondary | ICD-10-CM | POA: Diagnosis not present

## 2011-10-08 DIAGNOSIS — I10 Essential (primary) hypertension: Secondary | ICD-10-CM

## 2011-10-08 DIAGNOSIS — M549 Dorsalgia, unspecified: Secondary | ICD-10-CM

## 2011-10-08 DIAGNOSIS — E785 Hyperlipidemia, unspecified: Secondary | ICD-10-CM

## 2011-10-08 DIAGNOSIS — E119 Type 2 diabetes mellitus without complications: Secondary | ICD-10-CM

## 2011-10-08 MED ORDER — AMLODIPINE BESYLATE 10 MG PO TABS: 10.0000 mg | ORAL_TABLET | Freq: Every day | ORAL | Status: DC

## 2011-10-08 MED ORDER — CITALOPRAM HYDROBROMIDE 10 MG PO TABS: 10.0000 mg | ORAL_TABLET | Freq: Every day | ORAL | Status: DC

## 2011-10-08 MED ORDER — ATORVASTATIN CALCIUM 20 MG PO TABS: 20.0000 mg | ORAL_TABLET | Freq: Every day | ORAL | Status: DC

## 2011-10-08 MED ORDER — CYCLOBENZAPRINE HCL 5 MG PO TABS: 5.0000 mg | ORAL_TABLET | Freq: Three times a day (TID) | ORAL | Status: AC | PRN

## 2011-10-08 NOTE — Patient Instructions (Addendum)
Take all new medications as prescribed Continue all other medications as before All of your refills were sent to the pharmacy today Please go to LAB in the Basement for the blood and/or urine tests to be done at your convenience You will be contacted by phone if any changes need to be made immediately.  Otherwise, you will receive a letter about your results with an explanation. Please return in 6 months, or sooner if needed

## 2011-10-08 NOTE — Assessment & Plan Note (Signed)
C/w msk spasm, for flexeril prn,  to f/u any worsening symptoms or concerns 

## 2011-10-08 NOTE — Progress Notes (Signed)
Subjective:    Patient ID: Jacob Pittman, male    DOB: 1945/07/22, 66 y.o.   MRN: [id number]  HPI  Here for f/u;  Overall doing ok;  Pt denies CP, worsening SOB, DOE, wheezing, orthopnea, PND, worsening LE edema, palpitations, dizziness or syncope.  Pt denies neurological change such as new Headache, facial or extremity weakness.  Pt denies polydipsia, polyuria, or low sugar symptoms. Pt states overall good compliance with treatment and medications, good tolerability, and trying to follow lower cholesterol diet.  Pt denies worsening depressive symptoms, suicidal ideation or panic. No fever, wt loss, night sweats, loss of appetite, or other constitutional symptoms.  Pt states good ability with ADL's, low fall risk, home safety reviewed and adequate, no significant changes in hearing or vision, and occasionally active with exercise.  Has monthly nurse visits with "ok " blood pressures.  Out of amlodipnie for 1 wk.  No other acute complaints except for mild right lower back spasm for 3 days, no bowel or bladder change, fever, wt loss,  worsening LE pain/numbness/weakness, gait change or falls.  Past Medical History  Diagnosis Date  . DIABETES MELLITUS, TYPE II 11/07/2008  . GLUCOSE INTOLERANCE 10/08/2008  . HYPERLIPIDEMIA 11/07/2008  . ANXIETY 02/04/2009  . HYPERTENSION 10/08/2008  . 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 OSTEOARTHRITIS, KNEE, RIGHT 10/08/2008  . HIP PAIN, RIGHT 11/06/2009  . TOE PAIN 02/04/2009  . FATIGUE 10/08/2008  . Impaired glucose tolerance 08/17/2010   Past Surgical History  Procedure Date  . S/p left broken arm with pin     1980's    reports that he has quit smoking. He does not have any smokeless tobacco history on file. He reports that he drinks alcohol. His drug history not on file. family history includes Diabetes in his mother and Hypertension in his mother. No Known Allergies Current Outpatient Prescriptions on File Prior to Visit  Medication Sig Dispense Refill   . aspirin 81 MG EC tablet Take 81 mg by mouth daily.        . Glucosamine-Chondroitin 500-250 MG CAPS Take by mouth 3 (three) times daily.        Marland Kitchen DISCONTD: amLODipine (NORVASC) 10 MG tablet Take 1 tablet (10 mg total) by mouth daily.  90 tablet  3  . DISCONTD: atorvastatin (LIPITOR) 20 MG tablet Take 1 tablet (20 mg total) by mouth daily.  90 tablet  3  . DISCONTD: citalopram (CELEXA) 10 MG tablet Take 1 tablet (10 mg total) by mouth daily.  90 tablet  3   Review of Systems Review of Systems  Constitutional: Negative for diaphoresis and unexpected weight change.   Eyes: Negative for photophobia and visual disturbance.  Respiratory: Negative for choking and stridor.   Gastrointestinal: Negative for vomiting and blood in stool.  Genitourinary: Negative for hematuria and decreased urine volume.  Musculoskeletal: Negative for gait problem.  Skin: Negative for color change and wound.  Neurological: Negative for tremors and numbness.  Psychiatric/Behavioral: Negative for decreased concentration. The patient is not hyperactive.      Objective:   Physical Exam BP 142/70  Pulse 76  Temp 97.6 F (36.4 C) (Oral)  Ht 6\' 1"  (1.854 m)  Wt 237 lb 6 oz (107.673 kg)  BMI 31.32 kg/m2  SpO2 97% Physical Exam  VS noted Constitutional: Pt appears well-developed and well-nourished.  HENT: Head: Normocephalic.  Right Ear: External ear normal.  Left Ear: External ear normal.  Eyes: Conjunctivae and EOM are normal. Pupils are equal, round, and  reactive to light.  Neck: Normal range of motion. Neck supple.  Cardiovascular: Normal rate and regular rhythm.   Pulmonary/Chest: Effort normal and breath sounds normal.  Abd:  Soft, NT, non-distended, + BS Neurological: Pt is alert. No cranial nerve deficit. motor/gait intact Spine; nontender Skin: Skin is warm. No erythema.  Psychiatric: Pt behavior is normal. Thought content normal. 1+ nervous    Assessment & Plan:

## 2011-10-08 NOTE — Assessment & Plan Note (Signed)
stable overall by hx and exam, most recent data reviewed with pt, and pt to continue medical treatment as before BP Readings from Last 3 Encounters:  10/08/11 142/70  03/25/11 142/72  08/20/10 134/82

## 2011-10-08 NOTE — Assessment & Plan Note (Signed)
Also due for psa, UA - will order

## 2011-10-08 NOTE — Assessment & Plan Note (Signed)
stable overall by hx and exam, most recent data reviewed with pt, and pt to continue medical treatment as before Lab Results  Component Value Date   LDLCALC 115* 10/08/2008

## 2011-10-08 NOTE — Assessment & Plan Note (Signed)
stable overall by hx and exam, most recent data reviewed with pt, and pt to continue medical treatment as before  Lab Results  Component Value Date   HGBA1C 6.3 08/20/2010    

## 2011-10-14 ENCOUNTER — Encounter: Payer: Self-pay | Admitting: Internal Medicine

## 2011-10-14 ENCOUNTER — Other Ambulatory Visit (INDEPENDENT_AMBULATORY_CARE_PROVIDER_SITE_OTHER): Payer: Medicare Other

## 2011-10-14 DIAGNOSIS — I10 Essential (primary) hypertension: Secondary | ICD-10-CM

## 2011-10-14 DIAGNOSIS — N320 Bladder-neck obstruction: Secondary | ICD-10-CM | POA: Diagnosis not present

## 2011-10-14 DIAGNOSIS — E119 Type 2 diabetes mellitus without complications: Secondary | ICD-10-CM | POA: Diagnosis not present

## 2011-10-14 DIAGNOSIS — E785 Hyperlipidemia, unspecified: Secondary | ICD-10-CM | POA: Diagnosis not present

## 2011-10-14 LAB — URINALYSIS, ROUTINE W REFLEX MICROSCOPIC
Leukocytes, UA: NEGATIVE
Nitrite: NEGATIVE
Specific Gravity, Urine: >=1.03 (ref 1.000–1.030)
Urobilinogen, UA: 2 (ref 0.0–1.0)
pH: 6 (ref 5.0–8.0)

## 2011-10-14 LAB — PSA: PSA: 2.09 ng/mL (ref 0.10–4.00)

## 2011-10-14 LAB — CBC WITH DIFFERENTIAL/PLATELET
Basophils Absolute: 0 10*3/uL (ref 0.0–0.1)
Basophils Relative: 0.4 % (ref 0.0–3.0)
Eosinophils Absolute: 0.2 10*3/uL (ref 0.0–0.7)
HCT: 44.3 % (ref 39.0–52.0)
Hemoglobin: 14.7 g/dL (ref 13.0–17.0)
Lymphocytes Relative: 33.3 % (ref 12.0–46.0)
Lymphs Abs: 1.4 10*3/uL (ref 0.7–4.0)
MCHC: 33.1 g/dL (ref 30.0–36.0)
MCV: 101.1 fl — ABNORMAL HIGH (ref 78.0–100.0)
Monocytes Absolute: 0.5 10*3/uL (ref 0.1–1.0)
Neutro Abs: 2.2 10*3/uL (ref 1.4–7.7)
RBC: 4.38 Mil/uL (ref 4.22–5.81)
RDW: 13.8 % (ref 11.5–14.6)

## 2011-10-14 LAB — MICROALBUMIN / CREATININE URINE RATIO
Creatinine,U: 220.3 mg/dL
Microalb Creat Ratio: 0.7 mg/g (ref 0.0–30.0)
Microalb, Ur: 1.6 mg/dL (ref 0.0–1.9)

## 2011-10-14 LAB — HEMOGLOBIN A1C: Hgb A1c MFr Bld: 5.8 % (ref 4.6–6.5)

## 2011-10-14 LAB — HEPATIC FUNCTION PANEL
Alkaline Phosphatase: 58 U/L (ref 39–117)
Bilirubin, Direct: 0.3 mg/dL (ref 0.0–0.3)
Total Bilirubin: 1.5 mg/dL — ABNORMAL HIGH (ref 0.3–1.2)
Total Protein: 6 g/dL (ref 6.0–8.3)

## 2011-10-14 LAB — BASIC METABOLIC PANEL
CO2: 27 mEq/L (ref 19–32)
Calcium: 8.8 mg/dL (ref 8.4–10.5)
Creatinine, Ser: 0.8 mg/dL (ref 0.4–1.5)
Sodium: 140 mEq/L (ref 135–145)

## 2011-10-14 LAB — LIPID PANEL
LDL Cholesterol: 111 mg/dL — ABNORMAL HIGH (ref 0–99)
Total CHOL/HDL Ratio: 4
Triglycerides: 83 mg/dL (ref 0.0–149.0)

## 2011-12-15 ENCOUNTER — Telehealth: Payer: Self-pay

## 2011-12-15 DIAGNOSIS — R001 Bradycardia, unspecified: Secondary | ICD-10-CM

## 2011-12-15 NOTE — Telephone Encounter (Signed)
Tammy RN at [employer] called to inform checked the patients BP and P today.  BP 156/99, 155/82 and 135/84.  His pulse was 50, 43 then 37.  The RN is concerned about the pulse as she has worked with this patient for 3 years and the pulse is never this low.  She stated the patient feels fine, no symptoms, he worked third shift last night.  Please advise call back number is [phone].

## 2011-12-17 NOTE — Telephone Encounter (Signed)
[facility] informed MD put referral in at [facility] for ONO.

## 2012-04-08 ENCOUNTER — Ambulatory Visit: Payer: Medicare Other | Admitting: Internal Medicine

## 2012-04-08 DIAGNOSIS — Z0289 Encounter for other administrative examinations: Secondary | ICD-10-CM

## 2012-04-27 ENCOUNTER — Ambulatory Visit (INDEPENDENT_AMBULATORY_CARE_PROVIDER_SITE_OTHER): Payer: Medicare Other | Admitting: Internal Medicine

## 2012-04-27 ENCOUNTER — Encounter: Payer: Self-pay | Admitting: Internal Medicine

## 2012-04-27 VITALS — BP 132/82 | HR 79 | Temp 99.0°F | Ht 72.0 in | Wt 216.0 lb

## 2012-04-27 DIAGNOSIS — Z0000 Encounter for general adult medical examination without abnormal findings: Secondary | ICD-10-CM

## 2012-04-27 DIAGNOSIS — E119 Type 2 diabetes mellitus without complications: Secondary | ICD-10-CM | POA: Diagnosis not present

## 2012-04-27 DIAGNOSIS — I10 Essential (primary) hypertension: Secondary | ICD-10-CM

## 2012-04-27 NOTE — Assessment & Plan Note (Addendum)
ECG reviewed as per emr, stable overall by history and exam, recent data reviewed with pt, and pt to continue medical treatment as before,  to f/u any worsening symptoms or concerns BP Readings from Last 3 Encounters:  04/27/12 132/82  10/08/11 142/70  03/25/11 142/72

## 2012-04-27 NOTE — Patient Instructions (Addendum)
Your EKG today was OK Please return if you change your mind about the flu shot You are given the copy of your June 2013 labs Please go to LAB in the Basement for the blood and/or urine tests to be done at your convenience You will be contacted by phone if any changes need to be made immediately.  Otherwise, you will receive a letter about your results with an explanation, but please check with MyChart first. Please remember to sign up for My Chart at your earliest convenience, as this will be important to you in the future with finding out test results. Please return in 6 months, or sooner if needed

## 2012-04-27 NOTE — Progress Notes (Signed)
Subjective:    Patient ID: Jacob Pittman, male    DOB: [birth year], 67 y.o.   MRN: [id number]  HPI  Here for wellness and f/u;  Overall doing ok;  Pt denies CP, worsening SOB, DOE, wheezing, orthopnea, PND, worsening LE edema, palpitations, dizziness or syncope.  Pt denies neurological change such as new headache, facial or extremity weakness.  Pt denies polydipsia, polyuria, or low sugar symptoms. Pt states overall good compliance with treatment and medications, good tolerability, and has been trying to follow lower cholesterol diet.  Pt denies worsening depressive symptoms, suicidal ideation or panic. No fever, night sweats, wt loss, loss of appetite, or other constitutional symptoms.  Pt states good ability with ADL's, has low fall risk, home safety reviewed and adequate, no other significant changes in hearing or vision, and only occasionally active with exercise.  Has been able to lose wt from 237 to 216 in last 6 mo.  Was drinking more last visit ETOH (with elev MCV) but states much cut back since then Past Medical History  Diagnosis Date  . DIABETES MELLITUS, TYPE II 11/07/2008  . GLUCOSE INTOLERANCE 10/08/2008  . HYPERLIPIDEMIA 11/07/2008  . ANXIETY 02/04/2009  . HYPERTENSION 10/08/2008  . 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 OSTEOARTHRITIS, KNEE, RIGHT 10/08/2008  . HIP PAIN, RIGHT 11/06/2009  . TOE PAIN 02/04/2009  . FATIGUE 10/08/2008  . Impaired glucose tolerance 08/17/2010   Past Surgical History  Procedure Date  . S/p left broken arm with pin     1980's    reports that he has quit smoking. He does not have any smokeless tobacco history on file. He reports that he drinks alcohol. His drug history not on file. family history includes Diabetes in his mother and Hypertension in his mother. No Known Allergies Current Outpatient Prescriptions on File Prior to Visit  Medication Sig Dispense Refill  . amLODipine (NORVASC) 10 MG tablet Take 1 tablet (10 mg total) by mouth daily.  90  tablet  3  . aspirin 81 MG EC tablet Take 81 mg by mouth daily.        Marland Kitchen atorvastatin (LIPITOR) 20 MG tablet Take 1 tablet (20 mg total) by mouth daily.  90 tablet  3  . citalopram (CELEXA) 10 MG tablet Take 1 tablet (10 mg total) by mouth daily.  90 tablet  3  . Glucosamine-Chondroitin 500-250 MG CAPS Take by mouth 3 (three) times daily.         Review of Systems Constitutional: Negative for diaphoresis, activity change, appetite change or unexpected weight change.  HENT: Negative for hearing loss, ear pain, facial swelling, mouth sores and neck stiffness.   Eyes: Negative for pain, redness and visual disturbance.  Respiratory: Negative for shortness of breath and wheezing.   Cardiovascular: Negative for chest pain and palpitations.  Gastrointestinal: Negative for diarrhea, blood in stool, abdominal distention or other pain Genitourinary: Negative for hematuria, flank pain or change in urine volume.  Musculoskeletal: Negative for myalgias and joint swelling.  Skin: Negative for color change and wound.  Neurological: Negative for syncope and numbness. other than noted Hematological: Negative for adenopathy.  Psychiatric/Behavioral: Negative for hallucinations, self-injury, decreased concentration and agitation.      Objective:   Physical Exam BP 132/82  Pulse 79  Temp 99 F (37.2 C) (Oral)  Ht 6' (1.829 m)  Wt 216 lb (97.977 kg)  BMI 29.29 kg/m2  SpO2 97% VS noted,  Constitutional: Pt is oriented to person, place, and time. Appears well-developed and  well-nourished.  Head: Normocephalic and atraumatic.  Right Ear: External ear normal.  Left Ear: External ear normal.  Nose: Nose normal.  Mouth/Throat: Oropharynx is clear and moist.  Eyes: Conjunctivae and EOM are normal. Pupils are equal, round, and reactive to light.  Neck: Normal range of motion. Neck supple. No JVD present. No tracheal deviation present.  Cardiovascular: Normal rate, regular rhythm, normal heart sounds and  intact distal pulses.   Pulmonary/Chest: Effort normal and breath sounds normal.  Abdominal: Soft. Bowel sounds are normal. There is no tenderness. No HSM  Musculoskeletal: Normal range of motion. Exhibits no edema.  Lymphadenopathy:  Has no cervical adenopathy.  Neurological: Pt is alert and oriented to person, place, and time. Pt has normal reflexes. No cranial nerve deficit.  Skin: Skin is warm and dry. No rash noted.  Psychiatric:  Has  normal mood and affect. Behavior is normal.     Assessment & Plan:

## 2012-04-30 ENCOUNTER — Encounter: Payer: Self-pay | Admitting: Internal Medicine

## 2012-04-30 NOTE — Assessment & Plan Note (Signed)

## 2012-04-30 NOTE — Assessment & Plan Note (Signed)
stable overall by history and exam, recent data reviewed with pt, and pt to continue medical treatment as before,  to f/u any worsening symptoms or concerns BP Readings from Last 3 Encounters:  04/27/12 132/82  10/08/11 142/70  03/25/11 142/72

## 2012-09-28 ENCOUNTER — Emergency Department (HOSPITAL_BASED_OUTPATIENT_CLINIC_OR_DEPARTMENT_OTHER)
Admission: EM | Admit: 2012-09-28 | Discharge: 2012-09-28 | Disposition: A | Discharge status: Home / Self Care | Payer: Medicare Other | Attending: Emergency Medicine | Admitting: Emergency Medicine

## 2012-09-28 ENCOUNTER — Telehealth: Payer: Self-pay | Admitting: Internal Medicine

## 2012-09-28 ENCOUNTER — Encounter (HOSPITAL_BASED_OUTPATIENT_CLINIC_OR_DEPARTMENT_OTHER): Payer: Self-pay | Admitting: *Deleted

## 2012-09-28 ENCOUNTER — Emergency Department (HOSPITAL_BASED_OUTPATIENT_CLINIC_OR_DEPARTMENT_OTHER): Payer: Medicare Other

## 2012-09-28 DIAGNOSIS — E785 Hyperlipidemia, unspecified: Secondary | ICD-10-CM | POA: Diagnosis not present

## 2012-09-28 DIAGNOSIS — Z8739 Personal history of other diseases of the musculoskeletal system and connective tissue: Secondary | ICD-10-CM | POA: Insufficient documentation

## 2012-09-28 DIAGNOSIS — Z862 Personal history of diseases of the blood and blood-forming organs and certain disorders involving the immune mechanism: Secondary | ICD-10-CM | POA: Diagnosis not present

## 2012-09-28 DIAGNOSIS — E119 Type 2 diabetes mellitus without complications: Secondary | ICD-10-CM | POA: Insufficient documentation

## 2012-09-28 DIAGNOSIS — M7989 Other specified soft tissue disorders: Secondary | ICD-10-CM | POA: Insufficient documentation

## 2012-09-28 DIAGNOSIS — Z8669 Personal history of other diseases of the nervous system and sense organs: Secondary | ICD-10-CM | POA: Insufficient documentation

## 2012-09-28 DIAGNOSIS — Z79899 Other long term (current) drug therapy: Secondary | ICD-10-CM | POA: Diagnosis not present

## 2012-09-28 DIAGNOSIS — R609 Edema, unspecified: Secondary | ICD-10-CM | POA: Insufficient documentation

## 2012-09-28 DIAGNOSIS — J441 Chronic obstructive pulmonary disease with (acute) exacerbation: Secondary | ICD-10-CM | POA: Insufficient documentation

## 2012-09-28 DIAGNOSIS — Z87891 Personal history of nicotine dependence: Secondary | ICD-10-CM | POA: Diagnosis not present

## 2012-09-28 DIAGNOSIS — I5021 Acute systolic (congestive) heart failure: Secondary | ICD-10-CM | POA: Insufficient documentation

## 2012-09-28 DIAGNOSIS — Z8639 Personal history of other endocrine, nutritional and metabolic disease: Secondary | ICD-10-CM | POA: Insufficient documentation

## 2012-09-28 DIAGNOSIS — M1710 Unilateral primary osteoarthritis, unspecified knee: Secondary | ICD-10-CM | POA: Diagnosis not present

## 2012-09-28 DIAGNOSIS — Z87448 Personal history of other diseases of urinary system: Secondary | ICD-10-CM | POA: Diagnosis not present

## 2012-09-28 DIAGNOSIS — I10 Essential (primary) hypertension: Secondary | ICD-10-CM | POA: Diagnosis not present

## 2012-09-28 DIAGNOSIS — Z7982 Long term (current) use of aspirin: Secondary | ICD-10-CM | POA: Insufficient documentation

## 2012-09-28 DIAGNOSIS — J90 Pleural effusion, not elsewhere classified: Secondary | ICD-10-CM | POA: Diagnosis not present

## 2012-09-28 LAB — COMPREHENSIVE METABOLIC PANEL
ALT: 29 U/L (ref 0–53)
AST: 25 U/L (ref 0–37)
Albumin: 3.9 g/dL (ref 3.5–5.2)
Alkaline Phosphatase: 80 U/L (ref 39–117)
CO2: 27 mEq/L (ref 19–32)
Chloride: 99 mEq/L (ref 96–112)
GFR calc non Af Amer: 85 mL/min — ABNORMAL LOW (ref >90)
Potassium: 4.3 mEq/L (ref 3.5–5.1)
Sodium: 137 mEq/L (ref 135–145)
Total Bilirubin: 1.3 mg/dL — ABNORMAL HIGH (ref 0.3–1.2)

## 2012-09-28 LAB — CBC WITH DIFFERENTIAL/PLATELET
Basophils Absolute: 0 10*3/uL (ref 0.0–0.1)
Basophils Relative: 0 % (ref 0–1)
HCT: 44.9 % (ref 39.0–52.0)
Lymphocytes Relative: 30 % (ref 12–46)
MCHC: 34.3 g/dL (ref 30.0–36.0)
Monocytes Absolute: 0.6 10*3/uL (ref 0.1–1.0)
Neutro Abs: 2.6 10*3/uL (ref 1.7–7.7)
Neutrophils Relative %: 56 % (ref 43–77)
RDW: 12.8 % (ref 11.5–15.5)
WBC: 4.7 10*3/uL (ref 4.0–10.5)

## 2012-09-28 LAB — TROPONIN I: Troponin I: <0.3 ng/mL (ref <0.30)

## 2012-09-28 MED ORDER — AMLODIPINE BESYLATE 10 MG PO TABS: 10.0000 mg | ORAL_TABLET | Freq: Every day | ORAL | Status: DC

## 2012-09-28 MED ORDER — FUROSEMIDE 40 MG PO TABS: 40.0000 mg | ORAL_TABLET | Freq: Every day | ORAL | Status: DC

## 2012-09-28 MED ORDER — ALBUTEROL SULFATE HFA 108 (90 BASE) MCG/ACT IN AERS
2.0000 | INHALATION_SPRAY | Freq: Once | RESPIRATORY_TRACT | Status: AC
  Administered 2012-09-28: 2 via RESPIRATORY_TRACT
  Filled 2012-09-28: qty 6.7

## 2012-09-28 NOTE — ED Notes (Signed)
Assessed patient upon arrival to room after chest xray. He seemed to be very SOB with exertion. SAT 96% on RA. BBS clear but diminished. Patient stated he has been SOB for a month. RT will continue to monitor.

## 2012-09-28 NOTE — ED Provider Notes (Signed)
History     CSN: [id number]  Arrival date & time 09/28/12  1328   First MD Initiated Contact with Patient 09/28/12 1442      Chief Complaint  Patient presents with  . Shortness of Breath    (Consider location/radiation/quality/duration/timing/severity/associated sxs/prior treatment) Patient is a 67 y.o. male presenting with shortness of breath. The history is provided by the patient. No language interpreter was used.  Shortness of Breath Severity:  Moderate Associated symptoms: no abdominal pain, no chest pain, no cough, no fever and no wheezing   Associated symptoms comment:  Progressive shortness of breath for the past one month, markedly worse in the last 3 days. No cough, fever or chest pain. He states the SOB becomes worse with activity and he has also been sleeping in a recliner for the past 2 nights because lying flat worsens his symptoms as well. He denies any history of CHF, asthma, COPD/emphysema. He reports painless lower extremity swelling in the last several days.    Past Medical History  Diagnosis Date  . DIABETES MELLITUS, TYPE II 11/07/2008  . GLUCOSE INTOLERANCE 10/08/2008  . HYPERLIPIDEMIA 11/07/2008  . ANXIETY 02/04/2009  . HYPERTENSION 10/08/2008  . 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 OSTEOARTHRITIS, KNEE, RIGHT 10/08/2008  . HIP PAIN, RIGHT 11/06/2009  . TOE PAIN 02/04/2009  . FATIGUE 10/08/2008  . Impaired glucose tolerance 08/17/2010    Past Surgical History  Procedure Laterality Date  . S/p left broken arm with pin      1980's    Family History  Problem Relation Age of Onset  . Diabetes Mother   . Hypertension Mother     History  Substance Use Topics  . Smoking status: Former Games developer  . Smokeless tobacco: Not on file  . Alcohol Use: No      Review of Systems  Constitutional: Negative for fever and chills.  Respiratory: Positive for shortness of breath. Negative for cough and wheezing.   Cardiovascular: Positive for leg swelling.  Negative for chest pain and palpitations.  Gastrointestinal: Negative.  Negative for nausea and abdominal pain.  Genitourinary: Negative for dysuria.  Musculoskeletal: Negative.  Negative for myalgias.  Skin: Negative.  Negative for color change.  Neurological: Negative.   Psychiatric/Behavioral: Negative for confusion.    Allergies  Review of patient's allergies indicates no known allergies.  Home Medications   Current Outpatient Rx  Name  Route  Sig  Dispense  Refill  . amLODipine (NORVASC) 10 MG tablet   Oral   Take 1 tablet (10 mg total) by mouth daily.   90 tablet   3   . aspirin 81 MG EC tablet   Oral   Take 81 mg by mouth daily.           Marland Kitchen atorvastatin (LIPITOR) 20 MG tablet   Oral   Take 1 tablet (20 mg total) by mouth daily.   90 tablet   3   . Glucosamine-Chondroitin 500-250 MG CAPS   Oral   Take by mouth 3 (three) times daily.             BP 161/100  Pulse 100  Temp(Src) 97.9 F (36.6 C) (Oral)  Resp 20  Ht 6' (1.829 m)  Wt 230 lb (104.327 kg)  BMI 31.19 kg/m2  SpO2 90%  Physical Exam  Constitutional: He is oriented to person, place, and time. He appears well-developed and well-nourished. No distress.  Neck: Normal range of motion.  Cardiovascular: Normal rate and regular rhythm.  No murmur heard. Pulmonary/Chest:  Shallow breath sounds with end expiratory wheezes and bibasilar rales.   Abdominal: Soft. There is no tenderness. There is no rebound and no guarding.  Musculoskeletal: Normal range of motion. He exhibits edema.  Neurological: He is alert and oriented to person, place, and time.  Skin: Skin is warm and dry. No erythema.  Psychiatric: He has a normal mood and affect.    ED Course  Procedures (including critical care time)  Labs Reviewed  CBC WITH DIFFERENTIAL - Abnormal; Notable for the following:    MCV 102.0 (*)    MCH 35.0 (*)    All other components within normal limits  COMPREHENSIVE METABOLIC PANEL - Abnormal;  Notable for the following:    Glucose, Bld 103 (*)    Total Bilirubin 1.3 (*)    GFR calc non Af Amer 85 (*)    All other components within normal limits  PRO B NATRIURETIC PEPTIDE - Abnormal; Notable for the following:    Pro B Natriuretic peptide (BNP) 5335.0 (*)    All other components within normal limits  TROPONIN I   Results for orders placed during the hospital encounter of 09/28/12  TROPONIN I      Result Value Range   Troponin I <0.30  <0.30 ng/mL  CBC WITH DIFFERENTIAL      Result Value Range   WBC 4.7  4.0 - 10.5 K/uL   RBC 4.40  4.22 - 5.81 MIL/uL   Hemoglobin 15.4  13.0 - 17.0 g/dL   HCT 78.2  95.6 - 21.3 %   MCV 102.0 (*) 78.0 - 100.0 fL   MCH 35.0 (*) 26.0 - 34.0 pg   MCHC 34.3  30.0 - 36.0 g/dL   RDW 08.6  57.8 - 46.9 %   Platelets 219  150 - 400 K/uL   Neutrophils Relative % 56  43 - 77 %   Neutro Abs 2.6  1.7 - 7.7 K/uL   Lymphocytes Relative 30  12 - 46 %   Lymphs Abs 1.4  0.7 - 4.0 K/uL   Monocytes Relative 12  3 - 12 %   Monocytes Absolute 0.6  0.1 - 1.0 K/uL   Eosinophils Relative 2  0 - 5 %   Eosinophils Absolute 0.1  0.0 - 0.7 K/uL   Basophils Relative 0  0 - 1 %   Basophils Absolute 0.0  0.0 - 0.1 K/uL  COMPREHENSIVE METABOLIC PANEL      Result Value Range   Sodium 137  135 - 145 mEq/L   Potassium 4.3  3.5 - 5.1 mEq/L   Chloride 99  96 - 112 mEq/L   CO2 27  19 - 32 mEq/L   Glucose, Bld 103 (*) 70 - 99 mg/dL   BUN 17  6 - 23 mg/dL   Creatinine, Ser 6.29  0.50 - 1.35 mg/dL   Calcium 9.6  8.4 - 52.8 mg/dL   Total Protein 7.1  6.0 - 8.3 g/dL   Albumin 3.9  3.5 - 5.2 g/dL   AST 25  0 - 37 U/L   ALT 29  0 - 53 U/L   Alkaline Phosphatase 80  39 - 117 U/L   Total Bilirubin 1.3 (*) 0.3 - 1.2 mg/dL   GFR calc non Af Amer 85 (*) >90 mL/min   GFR calc Af Amer >90  >90 mL/min  PRO B NATRIURETIC PEPTIDE      Result Value Range   Pro B Natriuretic peptide (BNP)  5335.0 (*) 0 - 125 pg/mL    Dg Chest 2 View  09/28/2012   *RADIOLOGY REPORT*  Clinical  Data: Shortness of breath  CHEST - 2 VIEW  Comparison: 06/10/2010  Findings:  Heart size is moderately enlarged.  There is a right pleural effusion.  Right base atelectasis and/or airspace consolidation is noted.  The left lung appears clear.  IMPRESSION:  1.  Moderate cardiac enlargement and right pleural effusion. 2.  Decreased aeration to the right base.   Original Report Authenticated By: Signa Kell, M.D.     No diagnosis found.  1. CHF 2. Hypertension   MDM  He is comfortable at rest and SOB with exertion/ambulation. His O2 saturations stay above 90% (96% at rest). No pain. Labs/x-rays indicate CHF. Discussed with Dr. Jonny Ruiz who advises 40mg  Lasix daily and the office will arrange follow up appointment this week in 2 days. Patient is comfortable with going home.         Arnoldo Hooker, PA-C 09/28/12 1714

## 2012-09-28 NOTE — ED Provider Notes (Signed)
Medical screening examination/treatment/procedure(s) were performed by non-physician practitioner and as supervising physician I was immediately available for consultation/collaboration.   Gwyneth Sprout, MD 09/28/12 [phone]

## 2012-09-28 NOTE — ED Notes (Signed)
Pt c/o SOB at resting and walking x 1 week with increased bil leg swelling

## 2012-09-28 NOTE — Telephone Encounter (Signed)
Called per PA from ER; pt presented with new onset volume overload, prob CHF it seems  OK for lasix 40 qd  Harriett Sine to call pt (he is in ER now) - needs ROV Friday June 13 with me

## 2012-09-28 NOTE — ED Notes (Signed)
Patient walked lap around department. SAT dropped from 96% to 90%. He got very SOB, notable wheezing. MDI instructed and administered. RT will continue to monitor.

## 2012-09-29 ENCOUNTER — Ambulatory Visit: Payer: Medicare Other | Admitting: Internal Medicine

## 2012-09-29 NOTE — Telephone Encounter (Signed)
Appt on Friday June 13.

## 2012-09-30 ENCOUNTER — Encounter: Payer: Self-pay | Admitting: Internal Medicine

## 2012-09-30 ENCOUNTER — Ambulatory Visit (INDEPENDENT_AMBULATORY_CARE_PROVIDER_SITE_OTHER): Payer: Medicare Other | Admitting: Internal Medicine

## 2012-09-30 VITALS — BP 112/78 | HR 90 | Temp 98.4°F | Ht 73.0 in | Wt 230.1 lb

## 2012-09-30 DIAGNOSIS — E8779 Other fluid overload: Secondary | ICD-10-CM | POA: Diagnosis not present

## 2012-09-30 DIAGNOSIS — E119 Type 2 diabetes mellitus without complications: Secondary | ICD-10-CM | POA: Diagnosis not present

## 2012-09-30 DIAGNOSIS — F1021 Alcohol dependence, in remission: Secondary | ICD-10-CM | POA: Diagnosis not present

## 2012-09-30 DIAGNOSIS — I10 Essential (primary) hypertension: Secondary | ICD-10-CM | POA: Diagnosis not present

## 2012-09-30 DIAGNOSIS — Z87898 Personal history of other specified conditions: Secondary | ICD-10-CM

## 2012-09-30 DIAGNOSIS — I5043 Acute on chronic combined systolic (congestive) and diastolic (congestive) heart failure: Secondary | ICD-10-CM | POA: Insufficient documentation

## 2012-09-30 DIAGNOSIS — E8770 Fluid overload, unspecified: Secondary | ICD-10-CM

## 2012-09-30 DIAGNOSIS — E785 Hyperlipidemia, unspecified: Secondary | ICD-10-CM

## 2012-09-30 MED ORDER — LISINOPRIL 2.5 MG PO TABS: 2.5000 mg | ORAL_TABLET | Freq: Every day | ORAL | Status: DC

## 2012-09-30 MED ORDER — ATORVASTATIN CALCIUM 20 MG PO TABS: 20.0000 mg | ORAL_TABLET | Freq: Every day | ORAL | Status: DC

## 2012-09-30 MED ORDER — AMLODIPINE BESYLATE 10 MG PO TABS: 10.0000 mg | ORAL_TABLET | Freq: Every day | ORAL | Status: DC

## 2012-09-30 MED ORDER — FUROSEMIDE 40 MG PO TABS: ORAL_TABLET | ORAL | Status: DC

## 2012-09-30 NOTE — Patient Instructions (Signed)
OK to take the Lasix 40 mg at TWICE per day for 5 days, then once in the  AM only after that Please weigh yourself at home on a daily basis first thing in the AM after using the bathroom in your underwear, with a scale on a flat surface (like a scale on the bathroom tile floor); please bring these weights with you to your next appointment Please take all new medication as prescribed - the low dose of lisinopril 2.5 mg per day Please continue all other medications as before, and all refills have been done to your pharmacy You will be contacted regarding the referral for: echocardiogram, and Cardiology  Please remember to sign up for My Chart if you have not done so, as this will be important to you in the future with finding out test results, communicating by private email, and scheduling acute appointments online when needed.  As it may take " a while" to see cardiology, please plan to follow up here in 2 wks

## 2012-09-30 NOTE — Assessment & Plan Note (Signed)
stable overall by history and exam, recent data reviewed with pt, and pt to continue medical treatment as before,  to f/u any worsening symptoms or concerns Lab Results  Component Value Date   HGBA1C 5.8 10/14/2011

## 2012-09-30 NOTE — Assessment & Plan Note (Signed)
States now quit, may have significant use in the past, unclear signficance of this, somewhat tearful about this today, declines further eval or tx or counseling

## 2012-09-30 NOTE — Progress Notes (Signed)
  Subjective:    Patient ID: Jacob Pittman, male    DOB: 1945-11-28, 67 y.o.   MRN: [id number]  HPI  Here to f/u after seen in ER with volume overload, states some improved swelling and DOE with lasix 40 qd, not checking daily wts at home, Pt denies chest pain, increased sob or doe, wheezing, orthopnea, PND, increased LE swelling, palpitations, dizziness or syncope. Pt denies new neurological symptoms such as new headache, or facial or extremity weakness or numbness  Pt denies polydipsia, polyuria,   Quit ETOH approx 1 mo ago, has apparently been drinking more recently, though vague on amount.   Pt denies fever, wt loss, night sweats, loss of appetite, or other constitutional symptoms No other acute complaints Past Medical History  Diagnosis Date  . DIABETES MELLITUS, TYPE II 11/07/2008  . GLUCOSE INTOLERANCE 10/08/2008  . HYPERLIPIDEMIA 11/07/2008  . ANXIETY 02/04/2009  . HYPERTENSION 10/08/2008  . 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 OSTEOARTHRITIS, KNEE, RIGHT 10/08/2008  . HIP PAIN, RIGHT 11/06/2009  . TOE PAIN 02/04/2009  . FATIGUE 10/08/2008  . Impaired glucose tolerance 08/17/2010   Past Surgical History  Procedure Laterality Date  . S/p left broken arm with pin      1980's    reports that he has quit smoking. He does not have any smokeless tobacco history on file. He reports that he does not drink alcohol or use illicit drugs. family history includes Diabetes in his mother and Hypertension in his mother. No Known Allergies Current Outpatient Prescriptions on File Prior to Visit  Medication Sig Dispense Refill  . aspirin 81 MG EC tablet Take 81 mg by mouth daily.        . Glucosamine-Chondroitin 500-250 MG CAPS Take by mouth 3 (three) times daily.         No current facility-administered medications on file prior to visit.   Review of Systems  Constitutional: Negative for unexpected weight change, or unusual diaphoresis  HENT: Negative for tinnitus.   Eyes: Negative for  photophobia and visual disturbance.  Respiratory: Negative for choking and stridor.   Gastrointestinal: Negative for vomiting and blood in stool.  Genitourinary: Negative for hematuria and decreased urine volume.  Musculoskeletal: Negative for acute joint swelling Skin: Negative for color change and wound.  Neurological: Negative for tremors and numbness other than noted  Psychiatric/Behavioral: Negative for decreased concentration or  hyperactivity.       Objective:   Physical Exam BP 112/78  Pulse 90  Temp(Src) 98.4 F (36.9 C) (Oral)  Ht 6\' 1"  (1.854 m)  Wt 230 lb 2 oz (104.384 kg)  BMI 30.37 kg/m2  SpO2 98% VS noted, not ill or in distress Constitutional: Pt appears well-developed and well-nourished. /severe to morbid obese HENT: Head: NCAT.  Right Ear: External ear normal.  Left Ear: External ear normal.  Eyes: Conjunctivae and EOM are normal. Pupils are equal, round, and reactive to light.  Neck: Normal range of motion. Neck supple.  Cardiovascular: Normal rate and regular rhythm.   Pulmonary/Chest: Effort normal and breath sounds with few bibasilar rales.  Abd:  Soft, NT, non-distended, + BS Neurological: Pt is alert. Not confused . Motor 5/5 Skin: Bilat LE edema 2+ to above knees Psychiatric: Pt behavior is normal. Thought content normal.     Assessment & Plan:

## 2012-09-30 NOTE — Assessment & Plan Note (Addendum)
Suspect CHF, improved but still markedly overloaded at this time, for increased lasix 40 bid for 5 days, then qd after, check daily wts, , for echo, card referral Lab Results  Component Value Date   WBC 4.7 09/28/2012   HGB 15.4 09/28/2012   HCT 44.9 09/28/2012   PLT 219 09/28/2012   GLUCOSE 103* 09/28/2012   CHOL 172 10/14/2011   TRIG 83.0 10/14/2011   HDL 44.80 10/14/2011   LDLDIRECT 145.5 08/20/2010   LDLCALC 111* 10/14/2011   ALT 29 09/28/2012   AST 25 09/28/2012   NA 137 09/28/2012   K 4.3 09/28/2012   CL 99 09/28/2012   CREATININE 0.90 09/28/2012   BUN 17 09/28/2012   CO2 27 09/28/2012   TSH 1.64 10/14/2011   PSA 2.09 10/14/2011   HGBA1C 5.8 10/14/2011   MICROALBUR 1.6 10/14/2011   Note:  Total time for pt hx, exam, review of record with pt in the room, determination of diagnoses and plan for further eval and tx is > 40 min, with over 50% spent in coordination and counseling of patient

## 2012-09-30 NOTE — Assessment & Plan Note (Signed)
Also to start ACE low dose in light of prob chf and hx of DM

## 2012-10-01 NOTE — Assessment & Plan Note (Signed)
stable overall by history and exam, recent data reviewed with pt, and pt to continue medical treatment as before,  to f/u any worsening symptoms or concerns Lab Results  Component Value Date   LDLCALC 111* 10/14/2011

## 2012-10-07 ENCOUNTER — Encounter: Payer: Self-pay | Admitting: Internal Medicine

## 2012-10-07 ENCOUNTER — Ambulatory Visit (HOSPITAL_COMMUNITY): Payer: Medicare Other | Attending: Cardiology | Admitting: Radiology

## 2012-10-07 DIAGNOSIS — I10 Essential (primary) hypertension: Secondary | ICD-10-CM | POA: Diagnosis not present

## 2012-10-07 DIAGNOSIS — Z87898 Personal history of other specified conditions: Secondary | ICD-10-CM

## 2012-10-07 DIAGNOSIS — R0989 Other specified symptoms and signs involving the circulatory and respiratory systems: Secondary | ICD-10-CM | POA: Insufficient documentation

## 2012-10-07 DIAGNOSIS — E785 Hyperlipidemia, unspecified: Secondary | ICD-10-CM | POA: Diagnosis not present

## 2012-10-07 DIAGNOSIS — R0609 Other forms of dyspnea: Secondary | ICD-10-CM | POA: Insufficient documentation

## 2012-10-07 DIAGNOSIS — E669 Obesity, unspecified: Secondary | ICD-10-CM | POA: Insufficient documentation

## 2012-10-07 DIAGNOSIS — E119 Type 2 diabetes mellitus without complications: Secondary | ICD-10-CM | POA: Diagnosis not present

## 2012-10-07 DIAGNOSIS — R0602 Shortness of breath: Secondary | ICD-10-CM

## 2012-10-07 DIAGNOSIS — R609 Edema, unspecified: Secondary | ICD-10-CM | POA: Diagnosis not present

## 2012-10-07 DIAGNOSIS — E8770 Fluid overload, unspecified: Secondary | ICD-10-CM

## 2012-10-07 NOTE — Progress Notes (Signed)
Echocardiogram performed.  

## 2012-10-14 ENCOUNTER — Encounter: Payer: Self-pay | Admitting: Internal Medicine

## 2012-10-14 ENCOUNTER — Ambulatory Visit (INDEPENDENT_AMBULATORY_CARE_PROVIDER_SITE_OTHER): Payer: Medicare Other | Admitting: Internal Medicine

## 2012-10-14 VITALS — BP 130/78 | HR 62 | Temp 97.9°F | Ht 73.0 in | Wt 218.8 lb

## 2012-10-14 DIAGNOSIS — I509 Heart failure, unspecified: Secondary | ICD-10-CM

## 2012-10-14 DIAGNOSIS — I5021 Acute systolic (congestive) heart failure: Secondary | ICD-10-CM | POA: Diagnosis not present

## 2012-10-14 DIAGNOSIS — I10 Essential (primary) hypertension: Secondary | ICD-10-CM

## 2012-10-14 DIAGNOSIS — E119 Type 2 diabetes mellitus without complications: Secondary | ICD-10-CM | POA: Diagnosis not present

## 2012-10-14 MED ORDER — FUROSEMIDE 40 MG PO TABS: ORAL_TABLET | ORAL | Status: DC

## 2012-10-14 MED ORDER — ALBUTEROL SULFATE HFA 108 (90 BASE) MCG/ACT IN AERS: 2.0000 | INHALATION_SPRAY | Freq: Four times a day (QID) | RESPIRATORY_TRACT | Status: DC | PRN

## 2012-10-14 MED ORDER — AMLODIPINE BESYLATE 10 MG PO TABS: 5.0000 mg | ORAL_TABLET | Freq: Every day | ORAL | Status: DC

## 2012-10-14 MED ORDER — LISINOPRIL 20 MG PO TABS: 20.0000 mg | ORAL_TABLET | Freq: Every day | ORAL | Status: DC

## 2012-10-14 MED ORDER — PRAVASTATIN SODIUM 40 MG PO TABS: 40.0000 mg | ORAL_TABLET | Freq: Every day | ORAL | Status: DC

## 2012-10-14 NOTE — Progress Notes (Signed)
  Subjective:    Patient ID: Jacob Pittman, male    DOB: 1945-05-18, 67 y.o.   MRN: [id number]  HPI  Here with daughter to f/u at 2 wks, overall much improved volume overload symptoms with wt decreased from 230 to 218 after added 40 mg lasix, Pt denies chest pain, increased sob or doe, wheezing, orthopnea, PND, increased LE swelling, palpitations, dizziness or syncope, able to ambulate much easier, no longer limited to one to two rooms.  Pt denies new neurological symptoms such as new headache, or facial or extremity weakness or numbness   Pt denies polydipsia, polyuria.   Pt denies fever, wt loss, night sweats, loss of appetite, or other constitutional symptoms  Overall nees less expensive meds.  Daughter asks for trial proventil HFA for him which also seemed to help in the past.   Echo with EF 20-25% Past Medical History  Diagnosis Date  . DIABETES MELLITUS, TYPE II 11/07/2008  . GLUCOSE INTOLERANCE 10/08/2008  . HYPERLIPIDEMIA 11/07/2008  . ANXIETY 02/04/2009  . HYPERTENSION 10/08/2008  . 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 OSTEOARTHRITIS, KNEE, RIGHT 10/08/2008  . HIP PAIN, RIGHT 11/06/2009  . TOE PAIN 02/04/2009  . FATIGUE 10/08/2008  . Impaired glucose tolerance 08/17/2010   Past Surgical History  Procedure Laterality Date  . S/p left broken arm with pin      1980's    reports that he has quit smoking. He does not have any smokeless tobacco history on file. He reports that he does not drink alcohol or use illicit drugs. family history includes Diabetes in his mother and Hypertension in his mother. No Known Allergies Current Outpatient Prescriptions on File Prior to Visit  Medication Sig Dispense Refill  . aspirin 81 MG EC tablet Take 81 mg by mouth daily.        . Glucosamine-Chondroitin 500-250 MG CAPS Take by mouth 3 (three) times daily.         No current facility-administered medications on file prior to visit.     Review of Systems  Constitutional: Negative for  unexpected weight change, or unusual diaphoresis  HENT: Negative for tinnitus.   Eyes: Negative for photophobia and visual disturbance.  Respiratory: Negative for choking and stridor.   Gastrointestinal: Negative for vomiting and blood in stool.  Genitourinary: Negative for hematuria and decreased urine volume.  Musculoskeletal: Negative for acute joint swelling Skin: Negative for color change and wound.  Neurological: Negative for tremors and numbness other than noted  Psychiatric/Behavioral: Negative for decreased concentration or  hyperactivity.       Objective:   Physical Exam BP 130/78  Pulse 62  Temp(Src) 97.9 F (36.6 C) (Oral)  Ht 6\' 1"  (1.854 m)  Wt 218 lb 12 oz (99.224 kg)  BMI 28.87 kg/m2  SpO2 91% VS noted, not ill appearing Constitutional: Pt appears well-developed and well-nourished.  HENT: Head: NCAT.  Right Ear: External ear normal.  Left Ear: External ear normal.  Eyes: Conjunctivae and EOM are normal. Pupils are equal, round, and reactive to light.  Neck: Normal range of motion. Neck supple.  Cardiovascular: Normal rate and regular rhythm.   Pulmonary/Chest: Effort normal and breath sounds   - no rales or wheezing Neurological: Pt is alert. Not confused , motor 5/5 Skin: Skin is warm. No erythema. No LE edema Psychiatric: Pt behavior is normal. Thought content normal.     Assessment & Plan:

## 2012-10-14 NOTE — Patient Instructions (Addendum)
OK to finish and then stop the atovastatin (lipitor) cholesterol pill Then start Pravstatin (pravachol) 40 mg after that OK to stop the lisinopril 20 mg per day (for heart and blood pressure) OK to Decrease the Amlodipine 10 mg to HALF per day (which would be 5 mg) Please continue all other medications as before, including the furosemide (lasix) fluid pill at 40 mg per day Please keep your appointments with your specialists as you have planned  Please remember to sign up for My Chart if you have not done so, as this will be important to you in the future with finding out test results, communicating by private email, and scheduling acute appointments online when needed.  Please return in 1 months, or sooner if needed

## 2012-10-15 NOTE — Assessment & Plan Note (Signed)
To decrease the amlod to 5 mg from 10 to avoid hypotensin with trying to maximize ACE

## 2012-10-15 NOTE — Assessment & Plan Note (Signed)
stable overall by history and exam, recent data reviewed with pt, and pt to continue medical treatment as before,  to f/u any worsening symptoms or concerns Lab Results  Component Value Date   HGBA1C 5.8 10/14/2011    

## 2012-10-15 NOTE — Assessment & Plan Note (Addendum)
?   Etiology, suspect ETOH releated, recently quit after ETOH x 40 yrs, volume improved, no CP, to increase ACE today, echo reviewed with pt and daugther, cont all other meds, f/u next visit, consider add coreg  Note:  Total time for pt hx, exam, review of record with pt in the room, determination of diagnoses and plan for further eval and tx is > 40 min, with over 50% spent in coordination and counseling of patient

## 2012-10-26 ENCOUNTER — Ambulatory Visit: Payer: Medicare Other | Admitting: Internal Medicine

## 2012-11-04 ENCOUNTER — Encounter: Payer: Self-pay | Admitting: Internal Medicine

## 2012-11-04 ENCOUNTER — Ambulatory Visit (INDEPENDENT_AMBULATORY_CARE_PROVIDER_SITE_OTHER): Payer: Medicare Other | Admitting: Internal Medicine

## 2012-11-04 ENCOUNTER — Other Ambulatory Visit (INDEPENDENT_AMBULATORY_CARE_PROVIDER_SITE_OTHER): Payer: Medicare Other

## 2012-11-04 VITALS — BP 110/68 | HR 77 | Temp 97.7°F | Ht 73.0 in | Wt 220.1 lb

## 2012-11-04 DIAGNOSIS — I5020 Unspecified systolic (congestive) heart failure: Secondary | ICD-10-CM

## 2012-11-04 DIAGNOSIS — I10 Essential (primary) hypertension: Secondary | ICD-10-CM

## 2012-11-04 DIAGNOSIS — E119 Type 2 diabetes mellitus without complications: Secondary | ICD-10-CM

## 2012-11-04 LAB — CBC WITH DIFFERENTIAL/PLATELET
Eosinophils Relative: 2.5 % (ref 0.0–5.0)
HCT: 41.2 % (ref 39.0–52.0)
Hemoglobin: 13.9 g/dL (ref 13.0–17.0)
Lymphs Abs: 2 10*3/uL (ref 0.7–4.0)
MCV: 100.4 fl — ABNORMAL HIGH (ref 78.0–100.0)
Monocytes Absolute: 0.6 10*3/uL (ref 0.1–1.0)
Monocytes Relative: 9.4 % (ref 3.0–12.0)
Neutro Abs: 3.2 10*3/uL (ref 1.4–7.7)
RDW: 13.2 % (ref 11.5–14.6)

## 2012-11-04 LAB — HEPATIC FUNCTION PANEL
Albumin: 3.8 g/dL (ref 3.5–5.2)
Alkaline Phosphatase: 73 U/L (ref 39–117)
Total Bilirubin: 1.2 mg/dL (ref 0.3–1.2)

## 2012-11-04 LAB — LIPID PANEL
HDL: 40.2 mg/dL (ref >39.00)
LDL Cholesterol: 93 mg/dL (ref 0–99)
Total CHOL/HDL Ratio: 4
VLDL: 9.6 mg/dL (ref 0.0–40.0)

## 2012-11-04 LAB — BASIC METABOLIC PANEL
Calcium: 9.2 mg/dL (ref 8.4–10.5)
GFR: 114.88 mL/min (ref >60.00)
Glucose, Bld: 77 mg/dL (ref 70–99)
Potassium: 4.3 mEq/L (ref 3.5–5.1)
Sodium: 137 mEq/L (ref 135–145)

## 2012-11-04 LAB — HEMOGLOBIN A1C: Hgb A1c MFr Bld: 7 % — ABNORMAL HIGH (ref 4.6–6.5)

## 2012-11-04 NOTE — Assessment & Plan Note (Signed)
stable overall by history and exam after recent med changes, and pt to continue medical treatment as before,  to f/u any worsening symptoms or concerns; unable to increase ACE today due to lower BP

## 2012-11-04 NOTE — Assessment & Plan Note (Signed)
stable overall by history and exam, recent data reviewed with pt, and pt to continue medical treatment as before,  to f/u any worsening symptoms or concerns BP Readings from Last 3 Encounters:  11/04/12 110/68  10/14/12 130/78  09/30/12 112/78

## 2012-11-04 NOTE — Patient Instructions (Signed)
Please continue all other medications as before Please have the pharmacy call with any other refills you may need. Please go to the LAB in the Basement (turn left off the elevator) for the tests to be done today You will be contacted by phone if any changes need to be made immediately.  Otherwise, you will receive a letter about your results with an explanation, but please check with MyChart first.  Please keep your appointments with your specialists as you have planned  - Dr [place]  Please remember to sign up for My Chart if you have not done so, as this will be important to you in the future with finding out test results, communicating by private email, and scheduling acute appointments online when needed.

## 2012-11-04 NOTE — Assessment & Plan Note (Signed)
stable overall by history and exam, recent data reviewed with pt, and pt to continue medical treatment as before,  to f/u any worsening symptoms or concerns Lab Results  Component Value Date   HGBA1C 5.8 10/14/2011

## 2012-11-04 NOTE — Progress Notes (Addendum)
Subjective:    Patient ID: Jacob Pittman, male    DOB: [birth year], 67 y.o.   MRN: [id number]  HPI  Here to fu, wt overall stable, Pt denies chest pain, increased sob or doe, wheezing, orthopnea, PND, increased LE swelling, palpitations, dizziness or syncope. Able to walk in from parking lot without difficulty Overall good compliance with treatment, and good medicine tolerability.  Pt denies new neurological symptoms such as new headache, or facial or extremity weakness or numbness   Pt denies polydipsia, polyuria, or low sugar symptoms such as weakness or confusion improved with po intake.   trying to follow lower cholesterol, diabetic diet, wt overall stable but little exercise however.    Past Medical History  Diagnosis Date  . DIABETES MELLITUS, TYPE II 11/07/2008  . GLUCOSE INTOLERANCE 10/08/2008  . HYPERLIPIDEMIA 11/07/2008  . ANXIETY 02/04/2009  . HYPERTENSION 10/08/2008  . 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 OSTEOARTHRITIS, KNEE, RIGHT 10/08/2008  . HIP PAIN, RIGHT 11/06/2009  . TOE PAIN 02/04/2009  . FATIGUE 10/08/2008  . Impaired glucose tolerance 08/17/2010   Past Surgical History  Procedure Laterality Date  . S/p left broken arm with pin      1980's    reports that he has quit smoking. He does not have any smokeless tobacco history on file. He reports that he does not drink alcohol or use illicit drugs. family history includes Diabetes in his mother and Hypertension in his mother. No Known Allergies Current Outpatient Prescriptions on File Prior to Visit  Medication Sig Dispense Refill  . albuterol (PROVENTIL HFA;VENTOLIN HFA) 108 (90 BASE) MCG/ACT inhaler Inhale 2 puffs into the lungs every 6 (six) hours as needed for wheezing.  1 Inhaler  5  . amLODipine (NORVASC) 10 MG tablet Take 0.5 tablets (5 mg total) by mouth daily.  45 tablet  3  . aspirin 81 MG EC tablet Take 81 mg by mouth daily.        . furosemide (LASIX) 40 MG tablet 1 tab by mouth twice per day for 5 days,  then 1 tab in the AM only after that  90 tablet  3  . Glucosamine-Chondroitin 500-250 MG CAPS Take by mouth 3 (three) times daily.        Marland Kitchen lisinopril (PRINIVIL,ZESTRIL) 20 MG tablet Take 1 tablet (20 mg total) by mouth daily.  90 tablet  3  . pravastatin (PRAVACHOL) 40 MG tablet Take 1 tablet (40 mg total) by mouth daily.  90 tablet  3   No current facility-administered medications on file prior to visit.   Review of Systems  Constitutional: Negative for unexpected weight change, or unusual diaphoresis  HENT: Negative for tinnitus.   Eyes: Negative for photophobia and visual disturbance.  Respiratory: Negative for choking and stridor.   Gastrointestinal: Negative for vomiting and blood in stool.  Genitourinary: Negative for hematuria and decreased urine volume.  Musculoskeletal: Negative for acute joint swelling Skin: Negative for color change and wound.  Neurological: Negative for tremors and numbness other than noted  Psychiatric/Behavioral: Negative for decreased concentration or  hyperactivity.       Objective:   Physical Exam BP 110/68  Pulse 77  Temp(Src) 97.7 F (36.5 C) (Oral)  Ht 6\' 1"  (1.854 m)  Wt 220 lb 2 oz (99.848 kg)  BMI 29.05 kg/m2  SpO2 97% VS noted, not ill, upbeat today Constitutional: Pt appears well-developed and well-nourished.  HENT: Head: NCAT.  Right Ear: External ear normal.  Left Ear: External ear normal.  Eyes: Conjunctivae and EOM are normal. Pupils are equal, round, and reactive to light.  Neck: Normal range of motion. Neck supple.  Cardiovascular: Normal rate and regular rhythm.   Pulmonary/Chest: Effort normal and breath sounds normal.  Abd:  Soft, NT, non-distended, + BS Neurological: Pt is alert. Not confused , motor 5/5 Skin: Skin is warm. No erythema. trace to 1+ LE edema to knees Psychiatric: Pt behavior is normal. Thought content normal.     Assessment & Plan:  Quality Measures addressed:   Diabetes LDL < 100: pt declines further  medication Diabetes Hgba1c < 8%: pt declines further medication

## 2012-12-01 ENCOUNTER — Ambulatory Visit (INDEPENDENT_AMBULATORY_CARE_PROVIDER_SITE_OTHER): Payer: Medicare Other | Admitting: Cardiology

## 2012-12-01 ENCOUNTER — Encounter: Payer: Self-pay | Admitting: Cardiology

## 2012-12-01 VITALS — BP 124/68 | HR 91 | Ht 73.0 in | Wt 224.8 lb

## 2012-12-01 DIAGNOSIS — F1021 Alcohol dependence, in remission: Secondary | ICD-10-CM | POA: Diagnosis not present

## 2012-12-01 DIAGNOSIS — E785 Hyperlipidemia, unspecified: Secondary | ICD-10-CM | POA: Diagnosis not present

## 2012-12-01 DIAGNOSIS — I10 Essential (primary) hypertension: Secondary | ICD-10-CM | POA: Diagnosis not present

## 2012-12-01 DIAGNOSIS — R0602 Shortness of breath: Secondary | ICD-10-CM | POA: Diagnosis not present

## 2012-12-01 DIAGNOSIS — Z87898 Personal history of other specified conditions: Secondary | ICD-10-CM

## 2012-12-01 MED ORDER — PRAVASTATIN SODIUM 40 MG PO TABS: 40.0000 mg | ORAL_TABLET | Freq: Every day | ORAL | Status: DC

## 2012-12-01 MED ORDER — SPIRONOLACTONE 25 MG PO TABS
25.0000 mg | ORAL_TABLET | Freq: Every day | ORAL | Status: DC
Start: 2012-12-01 — End: 2015-01-07

## 2012-12-01 MED ORDER — CARVEDILOL 12.5 MG PO TABS: 12.5000 mg | ORAL_TABLET | Freq: Two times a day (BID) | ORAL | Status: DC

## 2012-12-01 NOTE — Patient Instructions (Signed)
Stop amlodipine and your inhaler.  Start carvedilol 12.5 mg twice a day and aldactone 25 mg once a day.  Continue your other medication.  Restrict your salt intake.   No alcohol.  I will see you in 2 weeks with lab work  We will schedule you for a nuclear stress test  2 Gram Low Sodium Diet A 2 gram sodium diet restricts the amount of sodium in the diet to no more than 2 g or 2000 mg daily. Limiting the amount of sodium is often used to help lower blood pressure. It is important if you have heart, liver, or kidney problems. Many foods contain sodium for flavor and sometimes as a preservative. When the amount of sodium in a diet needs to be low, it is important to know what to look for when choosing foods and drinks. The following includes some information and guidelines to help make it easier for you to adapt to a low sodium diet. QUICK TIPS  Do not add salt to food.  Avoid convenience items and fast food.  Choose unsalted snack foods.  Buy lower sodium products, often labeled as "lower sodium" or "no salt added."  Check food labels to learn how much sodium is in 1 serving.  When eating at a restaurant, ask that your food be prepared with less salt or none, if possible. READING FOOD LABELS FOR SODIUM INFORMATION The nutrition facts label is a good place to find how much sodium is in foods. Look for products with no more than 500 to 600 mg of sodium per meal and no more than 150 mg per serving. Remember that 2 g = 2000 mg. The food label may also list foods as:  Sodium-free: Less than 5 mg in a serving.  Very low sodium: 35 mg or less in a serving.  Low-sodium: 140 mg or less in a serving.  Light in sodium: 50% less sodium in a serving. For example, if a food that usually has 300 mg of sodium is changed to become light in sodium, it will have 150 mg of sodium.  Reduced sodium: 25% less sodium in a serving. For example, if a food that usually has 400 mg of sodium is changed to  reduced sodium, it will have 300 mg of sodium. CHOOSING FOODS Grains  Avoid: Salted crackers and snack items. Some cereals, including instant hot cereals. Bread stuffing and biscuit mixes. Seasoned rice or pasta mixes.  Choose: Unsalted snack items. Low-sodium cereals, oats, puffed wheat and rice, shredded wheat. English muffins and bread. Pasta. Meats  Avoid: Salted, canned, smoked, spiced, pickled meats, including fish and poultry. Bacon, ham, sausage, cold cuts, hot dogs, anchovies.  Choose: Low-sodium canned tuna and salmon. Fresh or frozen meat, poultry, and fish. Dairy  Avoid: Processed cheese and spreads. Cottage cheese. Buttermilk and condensed milk. Regular cheese.  Choose: Milk. Low-sodium cottage cheese. Yogurt. Sour cream. Low-sodium cheese. Fruits and Vegetables  Avoid: Regular canned vegetables. Regular canned tomato sauce and paste. Frozen vegetables in sauces. Olives. Rosita Fire. Relishes. Sauerkraut.  Choose: Low-sodium canned vegetables. Low-sodium tomato sauce and paste. Frozen or fresh vegetables. Fresh and frozen fruit. Condiments  Avoid: Canned and packaged gravies. Worcestershire sauce. Tartar sauce. Barbecue sauce. Soy sauce. Steak sauce. Ketchup. Onion, garlic, and table salt. Meat flavorings and tenderizers.  Choose: Fresh and dried herbs and spices. Low-sodium varieties of mustard and ketchup. Lemon juice. Tabasco sauce. Horseradish. SAMPLE 2 GRAM SODIUM MEAL PLAN Breakfast / Sodium (mg)  1 cup low-fat milk /  143 mg  2 slices whole-wheat toast / 270 mg  1 tbs heart-healthy margarine / 153 mg  1 hard-boiled egg / 139 mg  1 small orange / 0 mg Lunch / Sodium (mg)  1 cup raw carrots / 76 mg   cup hummus / 298 mg  1 cup low-fat milk / 143 mg   cup red grapes / 2 mg  1 whole-wheat pita bread / 356 mg Dinner / Sodium (mg)  1 cup whole-wheat pasta / 2 mg  1 cup low-sodium tomato sauce / 73 mg  3 oz lean ground beef / 57 mg  1 small side  salad (1 cup raw spinach leaves,  cup cucumber,  cup yellow bell pepper) with 1 tsp olive oil and 1 tsp red wine vinegar / 25 mg Snack / Sodium (mg)  1 container low-fat vanilla yogurt / 107 mg  3 graham cracker squares / 127 mg Nutrient Analysis  Calories: 2033  Protein: 77 g  Carbohydrate: 282 g  Fat: 72 g  Sodium: 1971 mg Document Released: 04/06/2005 Document Revised: 06/29/2011 Document Reviewed: 07/08/2009 [employer] Patient Information 2014 [place], [place].

## 2012-12-01 NOTE — Progress Notes (Signed)
[name] Date of Birth: 20-Jun-1945 Medical Record #[id number]  History of Present Illness: Mr. Errico is seen at the request of Dr. Jonny Ruiz for evaluation of congestive heart failure. He is a very pleasant 67 year old black male who has a history of hypertension and hyperlipidemia. He states about 3 months ago he began experiencing symptoms of increasing dyspnea and lower extremity swelling. He gained weight. He denied any orthopnea or PND. He has had no chest pain or palpitations. He was seen by Dr. Jonny Ruiz and chest x-ray demonstrated significant cardiomegaly and a right pleural effusion. BNP was elevated at 5335. Echocardiogram showed biventricular enlargement with ejection fraction of 20-25% and diffuse hypokinesis. He was started on Lasix therapy and noticed a significant improvement in his breathing and swelling. He still has swelling and his weight is still elevated. Patient denies any prior cardiac history. He has no history of myocardial infarction. He has had no prior cardiac evaluation. He does have a history of heavy alcohol abuse and drank over a pint of moonshine daily for many years. He no longer drinks alcohol.  Current Outpatient Prescriptions on File Prior to Visit  Medication Sig Dispense Refill  . aspirin 81 MG EC tablet Take 81 mg by mouth daily.        . furosemide (LASIX) 40 MG tablet 1 tab by mouth twice per day for 5 days, then 1 tab in the AM only after that  90 tablet  3  . Glucosamine-Chondroitin 500-250 MG CAPS Take by mouth 3 (three) times daily.        Marland Kitchen lisinopril (PRINIVIL,ZESTRIL) 20 MG tablet Take 1 tablet (20 mg total) by mouth daily.  90 tablet  3   No current facility-administered medications on file prior to visit.    No Known Allergies  Past Medical History  Diagnosis Date  . DIABETES MELLITUS, TYPE II 11/07/2008  . GLUCOSE INTOLERANCE 10/08/2008  . HYPERLIPIDEMIA 11/07/2008  . ANXIETY 02/04/2009  . HYPERTENSION 10/08/2008  . ERECTILE DYSFUNCTION,  ORGANIC 11/07/2008  . OSTEOARTHRITIS, KNEE, RIGHT 10/08/2008  . HIP PAIN, RIGHT 11/06/2009  . TOE PAIN 02/04/2009  . FATIGUE 10/08/2008  . Impaired glucose tolerance 08/17/2010    Past Surgical History  Procedure Laterality Date  . S/p left broken arm with pin      1980's    History  Smoking status  . Former Smoker  Smokeless tobacco  . Not on file    History  Alcohol Use No    Family History  Problem Relation Age of Onset  . Diabetes Mother   . Hypertension Mother     Review of Systems: As noted in history of present illness.  All other systems were reviewed and are negative.  Physical Exam: BP 124/68  Pulse 91  Ht 6\' 1"  (1.854 m)  Wt 224 lb 12.8 oz (101.969 kg)  BMI 29.67 kg/m2  SpO2 98% He is a very pleasant, obese black male in no acute distress. HEENT: Normal cephalic, atraumatic. Pupils equal round and reactive to light accommodation. Extraocular movements are full. Sclera are clear. Oropharynx is clear. Neck is supple without bruits, adenopathy, or thyromegaly. Jugular venous pulse is elevated at 6 cm. Lungs: Clear Cardiovascular: Regular rate and rhythm. Normal S1 and S2. No gallop, murmur, or click. Abdomen: Obese, soft, nontender. Bowel sounds are positive. No bladder splenomegaly. Extremities: No cyanosis. 1+ bilateral pretibial edema. Pedal pulses are 2+. Skin: Warm and dry Neuro: Alert and oriented x3. Cranial nerves II through XII are intact. No  focal findings.  LABORATORY DATA: ECG shows normal sinus rhythm with left anterior fascicular block. Echo:Study Conclusions  - Left ventricle: The cavity size was mildly dilated. Wall thickness was normal. Systolic function was severely reduced. The estimated ejection fraction was in the range of 20% to 25%. Diffuse hypokinesis. The study is not technically sufficient to allow evaluation of LV diastolic function. - Aortic valve: Trivial regurgitation. - Mitral valve: Calcified annulus. Mild  regurgitation. - Left atrium: The atrium was mildly to moderately dilated. - Right ventricle: The cavity size was moderately dilated. Systolic function was mildly reduced. - Right atrium: The atrium was moderately dilated. - Tricuspid valve: Moderate regurgitation. - Pulmonary arteries: Systolic pressure was mildly increased. PA peak pressure: 43mm Hg (S). - Pericardium, extracardiac: A small pericardial effusion was identified circumferential to the heart.  CHEST - 2 VIEW  Comparison: 06/10/2010  Findings:  Heart size is moderately enlarged.  There is a right pleural effusion.  Right base atelectasis and/or airspace consolidation is noted.  The left lung appears clear.  IMPRESSION:  1. Moderate cardiac enlargement and right pleural effusion. 2. Decreased aeration to the right base.   Original Report Authenticated By: Signa Kell, M.D.  Lab Results  Component Value Date   WBC 5.9 11/04/2012   HGB 13.9 11/04/2012   HCT 41.2 11/04/2012   PLT 232.0 11/04/2012   GLUCOSE 77 11/04/2012   CHOL 143 11/04/2012   TRIG 48.0 11/04/2012   HDL 40.20 11/04/2012   LDLDIRECT 145.5 08/20/2010   LDLCALC 93 11/04/2012   ALT 26 11/04/2012   AST 21 11/04/2012   NA 137 11/04/2012   K 4.3 11/04/2012   CL 103 11/04/2012   CREATININE 0.9 11/04/2012   BUN 15 11/04/2012   CO2 29 11/04/2012   TSH 1.64 10/14/2011   PSA 2.09 10/14/2011   HGBA1C 7.0* 11/04/2012   MICROALBUR 1.6 10/14/2011     Assessment / Plan: 1. Acute on chronic systolic congestive heart failure. Ejection fraction 20-25%. 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 Patient is no longer drinking alcohol. I think he is on appropriate doses of Lasix and lisinopril currently. I recommended stopping amlodipine. We will add carvedilol 12.5 mg twice a day and Aldactone 25 mg daily. I recommended sodium restriction 2 g daily. I recommended regular walking. He should avoid heavy lifting. To rule out ischemic  etiology we will schedule him for A lexiscan Myoview study. I will followup again in 2 weeks and will repeat a basic metabolic panel, BNP level, and TSH at that time. After optimization of his medical therapy we will need to repeat an echocardiogram in 3 months. If ejection fraction remains significantly impaired he may need to be considered for prophylactic ICD.  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 Hypertension  4. Glucose intolerance

## 2012-12-06 ENCOUNTER — Ambulatory Visit (HOSPITAL_COMMUNITY): Payer: Medicare Other | Attending: Cardiology | Admitting: Radiology

## 2012-12-06 VITALS — BP 117/84 | Ht 73.0 in | Wt 220.0 lb

## 2012-12-06 DIAGNOSIS — I4949 Other premature depolarization: Secondary | ICD-10-CM | POA: Diagnosis not present

## 2012-12-06 DIAGNOSIS — E739 Lactose intolerance, unspecified: Secondary | ICD-10-CM | POA: Diagnosis not present

## 2012-12-06 DIAGNOSIS — E785 Hyperlipidemia, unspecified: Secondary | ICD-10-CM

## 2012-12-06 DIAGNOSIS — R0602 Shortness of breath: Secondary | ICD-10-CM

## 2012-12-06 DIAGNOSIS — I10 Essential (primary) hypertension: Secondary | ICD-10-CM

## 2012-12-06 DIAGNOSIS — R0989 Other specified symptoms and signs involving the circulatory and respiratory systems: Secondary | ICD-10-CM | POA: Insufficient documentation

## 2012-12-06 DIAGNOSIS — R0609 Other forms of dyspnea: Secondary | ICD-10-CM | POA: Insufficient documentation

## 2012-12-06 DIAGNOSIS — Z87898 Personal history of other specified conditions: Secondary | ICD-10-CM

## 2012-12-06 DIAGNOSIS — Z87891 Personal history of nicotine dependence: Secondary | ICD-10-CM | POA: Diagnosis not present

## 2012-12-06 DIAGNOSIS — R42 Dizziness and giddiness: Secondary | ICD-10-CM | POA: Diagnosis not present

## 2012-12-06 MED ORDER — TECHNETIUM TC 99M SESTAMIBI GENERIC - CARDIOLITE
11.0000 | Freq: Once | INTRAVENOUS | Status: AC | PRN
  Administered 2012-12-06: 11 via INTRAVENOUS

## 2012-12-06 MED ORDER — REGADENOSON 0.4 MG/5ML IV SOLN
0.4000 mg | Freq: Once | INTRAVENOUS | Status: AC
  Administered 2012-12-06: 0.4 mg via INTRAVENOUS

## 2012-12-06 MED ORDER — TECHNETIUM TC 99M SESTAMIBI GENERIC - CARDIOLITE
33.0000 | Freq: Once | INTRAVENOUS | Status: AC | PRN
  Administered 2012-12-06: 33 via INTRAVENOUS

## 2012-12-06 NOTE — Progress Notes (Signed)
[facility] SITE 3 NUCLEAR MED [address] [place], [place] [address] [phone]    Cardiology Nuclear Med Study  Jacob Pittman is a 67 y.o. male     MRN : [id number]     DOB: 05-10-1945  Procedure Date: 12/06/2012  Nuclear Med Background Indication for Stress Test:  Evaluation for Ischemia History:  LAFB;CHF;6/14 Echo:20-25%,Mild MR and moderate TR Cardiac Risk Factors: History of Smoking, Hypertension, Lipids and Glucose Intolerance Symptoms:  Dizziness and DOE   Nuclear Pre-Procedure Caffeine/Decaff Intake:  None> 12 hrs NPO After: 8:00pm   Lungs:  clear O2 Sat: 98% on room air. IV 0.9% NS with Angio Cath:  20g  IV Site: R Antecubital x 1, tolerated well IV Started by:  Irean Hong, RN  Chest Size (in):  46 Cup Size: n/a  Height: 6\' 1"  (1.854 m)  Weight:  220 lb (99.791 kg)  BMI:  Body mass index is 29.03 kg/(m^2). Tech Comments:  Took Coreg this am    Nuclear Med Study 1 or 2 day study: 1 day  Stress Test Type:  Lexiscan  Reading MD: Cassell Clement, MD  Order Authorizing Provider:  Peter Swaziland, MD  Resting Radionuclide: Technetium 50m Sestamibi  Resting Radionuclide Dose: 11.0 mCi   Stress Radionuclide:  Technetium 38m Sestamibi  Stress Radionuclide Dose: 33.0 mCi           Stress Protocol Rest HR: 66 Stress HR: 74  Rest BP: 117/84 Stress BP: 121/82  Exercise Time (min): n/a METS: n/a   Predicted Max HR: 151 bpm % Max HR: 49.01 bpm Rate Pressure Product: 8954   Dose of Adenosine (mg):  n/a Dose of Lexiscan: 0.4 mg  Dose of Atropine (mg): n/a Dose of Dobutamine: n/a mcg/kg/min (at max HR)  Stress Test Technologist: Cathlyn Parsons, RN  Nuclear Technologist:  Domenic Polite, CNMT     Rest Procedure:  Myocardial perfusion imaging was performed at rest 45 minutes following the intravenous administration of Technetium 40m Sestamibi. Rest ECG: NSR with non-specific ST-T wave changes  Stress Procedure:  The patient received IV Lexiscan 0.4  mg over 15-seconds.  Technetium 64m Sestamibi injected at 30-seconds.  Quantitative spect images were obtained after a 45 minute delay. Stress ECG: No significant change from baseline ECG  QPS Raw Data Images:  Normal; no motion artifact; normal heart/lung ratio. Stress Images:  There is decreased uptake in the inferior wall. Rest Images:  There is decreased uptake in the inferior wall. Subtraction (SDS):  There is a fixed inferior defect that is most consistent with diaphragmatic attenuation. Cannot exclude small inferior wall scar. No significant reversibility. SDS 2. Transient Ischemic Dilatation (Normal <1.22):  n/a Lung/Heart Ratio (Normal <0.45):  0.60  Quantitative Gated Spect Images QGS EDV:  n/a QGS ESV:  n/a  Impression Exercise Capacity:  Lexiscan with no exercise. BP Response:  Normal blood pressure response. Clinical Symptoms:  No significant symptoms noted. ECG Impression:  No significant ST segment change suggestive of ischemia. Comparison with Prior Nuclear Study: No previous nuclear study performed  Overall Impression:  Low risk stress nuclear study. No significant ischemia. Cannot exclude small old inferior wall scar vs diaphragmatic attenuation.  No gating because of frequent PVCs.  LV Ejection Fraction: Study not gated.  LV Wall Motion:  Study not gated  [employer]

## 2012-12-09 ENCOUNTER — Telehealth: Payer: Self-pay | Admitting: Cardiology

## 2012-12-09 NOTE — Telephone Encounter (Signed)
New problem   Vikki [place]/Daughter is returning your call concerning her dad's stress test.

## 2012-12-09 NOTE — Telephone Encounter (Signed)
Returned call to patient's daughter Jacob Pittman echo results given.

## 2012-12-14 ENCOUNTER — Encounter: Payer: Self-pay | Admitting: Cardiology

## 2012-12-14 ENCOUNTER — Ambulatory Visit (INDEPENDENT_AMBULATORY_CARE_PROVIDER_SITE_OTHER): Payer: Medicare Other | Admitting: Cardiology

## 2012-12-14 VITALS — BP 118/64 | HR 45 | Ht 73.0 in | Wt 224.8 lb

## 2012-12-14 DIAGNOSIS — F1021 Alcohol dependence, in remission: Secondary | ICD-10-CM | POA: Diagnosis not present

## 2012-12-14 DIAGNOSIS — I10 Essential (primary) hypertension: Secondary | ICD-10-CM

## 2012-12-14 DIAGNOSIS — R0602 Shortness of breath: Secondary | ICD-10-CM | POA: Diagnosis not present

## 2012-12-14 DIAGNOSIS — I5022 Chronic systolic (congestive) heart failure: Secondary | ICD-10-CM

## 2012-12-14 DIAGNOSIS — I509 Heart failure, unspecified: Secondary | ICD-10-CM

## 2012-12-14 DIAGNOSIS — N529 Male erectile dysfunction, unspecified: Secondary | ICD-10-CM | POA: Diagnosis not present

## 2012-12-14 DIAGNOSIS — Z87898 Personal history of other specified conditions: Secondary | ICD-10-CM

## 2012-12-14 DIAGNOSIS — I428 Other cardiomyopathies: Secondary | ICD-10-CM | POA: Diagnosis not present

## 2012-12-14 NOTE — Patient Instructions (Addendum)
Your physician has requested that you have an echocardiogram IN 2 MONTHS . Echocardiography is a painless test that uses sound waves to create images of your heart. It provides your doctor with information about the size and shape of your heart and how well your heart's chambers and valves are working. This procedure takes approximately one hour. There are no restrictions for this procedure.  Your physician recommends that you schedule a follow-up appointment in:(AFTER ECHO)  Continue your current therapy  I will check blood work today.  I will see you in 2 months and we will repeat an echocardiogram   Your physician recommends that you return for lab work in: TODAY (TSH,BNP,BMET)

## 2012-12-14 NOTE — Progress Notes (Signed)
Jacob Pittman Date of Birth: Aug 31, 1945 Medical Record #[id number]  History of Present Illness: [name] is seen for followup of his congestive heart failure. He reports he is feeling very well. He has no shortness of breath. He denies any dizziness or lightheadedness. He has no orthopnea, PND, edema, or chest pain. He is tolerating his medications well.  Current Outpatient Prescriptions on File Prior to Visit  Medication Sig Dispense Refill  . aspirin 81 MG EC tablet Take 81 mg by mouth daily.        . carvedilol (COREG) 12.5 MG tablet Take 1 tablet (12.5 mg total) by mouth 2 (two) times daily.  180 tablet  3  . lisinopril (PRINIVIL,ZESTRIL) 20 MG tablet Take 1 tablet (20 mg total) by mouth daily.  90 tablet  3  . pravastatin (PRAVACHOL) 40 MG tablet Take 1 tablet (40 mg total) by mouth daily.  90 tablet  3  . spironolactone (ALDACTONE) 25 MG tablet Take 1 tablet (25 mg total) by mouth daily.  90 tablet  3   No current facility-administered medications on file prior to visit.    No Known Allergies  Past Medical History  Diagnosis Date  . DIABETES MELLITUS, TYPE II 11/07/2008  . GLUCOSE INTOLERANCE 10/08/2008  . HYPERLIPIDEMIA 11/07/2008  . ANXIETY 02/04/2009  . HYPERTENSION 10/08/2008  . 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 OSTEOARTHRITIS, KNEE, RIGHT 10/08/2008  . HIP PAIN, RIGHT 11/06/2009  . TOE PAIN 02/04/2009  . FATIGUE 10/08/2008  . Impaired glucose tolerance 08/17/2010  . Chronic systolic CHF (congestive heart failure)   . Nonischemic cardiomyopathy     Past Surgical History  Procedure Laterality Date  . S/p left broken arm with pin      1980's    History  Smoking status  . Former Smoker  Smokeless tobacco  . Not on file    History  Alcohol Use No    Family History  Problem Relation Age of Onset  . Diabetes Mother   . Hypertension Mother     Review of Systems: As noted in history of present illness.  All other systems were reviewed and are  negative.  Physical Exam: BP 118/64  Pulse 45  Ht 6\' 1"  (1.854 m)  Wt 224 lb 12.8 oz (101.969 kg)  BMI 29.67 kg/m2  SpO2 98% He is a very pleasant, obese black male in no acute distress. HEENT: Normal. Neck is supple without bruits, adenopathy, or thyromegaly. No JVD. Lungs: Clear Cardiovascular: Regular rate and rhythm. Normal S1 and S2. No gallop, murmur, or click. Abdomen: Obese, soft, nontender. Bowel sounds are positive. No bladder splenomegaly. Extremities: No cyanosis. Trace bilateral pretibial edema. Pedal pulses are 2+. Skin: Warm and dry Neuro: Alert and oriented x3. Cranial nerves II through XII are intact. No focal findings.  LABORATORY DATA:  Echo:Study Conclusions  - Left ventricle: The cavity size was mildly dilated. Wall thickness was normal. Systolic function was severely reduced. The estimated ejection fraction was in the range of 20% to 25%. Diffuse hypokinesis. The study is not technically sufficient to allow evaluation of LV diastolic function. - Aortic valve: Trivial regurgitation. - Mitral valve: Calcified annulus. Mild regurgitation. - Left atrium: The atrium was mildly to moderately dilated. - Right ventricle: The cavity size was moderately dilated. Systolic function was mildly reduced. - Right atrium: The atrium was moderately dilated. - Tricuspid valve: Moderate regurgitation. - Pulmonary arteries: Systolic pressure was mildly increased. PA peak pressure: 43mm Hg (S). - Pericardium, extracardiac: A small  pericardial effusion was identified circumferential to the heart.  Cardiology Nuclear Med Study  Jacob Pittman is a 67 y.o. 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  IV 0.9% NS with Angio Cath: 20g   IV Site: R Antecubital x 1, tolerated well  IV Started by: Irean Hong, RN   Chest Size (in): 46  Cup Size: n/a   Height: 6\' 1"  (1.854 m)  Weight: 220 lb (99.791 kg)   BMI: Body mass index is 29.03 kg/(m^2).  Tech Comments: Took Coreg this am   Nuclear Med Study  1 or 2 day study: 1 day  Stress Test Type: Lexiscan   Reading MD: Cassell Clement, MD  Order Authorizing Provider: Peter Swaziland, MD   Resting Radionuclide: Technetium 70m Sestamibi  Resting Radionuclide Dose: 11.0 mCi   Stress Radionuclide: Technetium 35m Sestamibi  Stress Radionuclide Dose: 33.0 mCi   Stress Protocol  Rest HR: 66  Stress HR: 74   Rest BP: 117/84  Stress BP: 121/82   Exercise Time (min): n/a  METS: n/a   Predicted Max HR: 151 bpm  % Max HR: 49.01 bpm  Rate Pressure Product: 8954  Dose of Adenosine (mg): n/a  Dose of Lexiscan: 0.4 mg   Dose of Atropine (mg): n/a  Dose of Dobutamine: n/a mcg/kg/min (at max HR)   Stress Test Technologist: Cathlyn Parsons, RN  Nuclear Technologist: Domenic Polite, CNMT   Rest Procedure: Myocardial perfusion imaging was performed at rest 45 minutes following the intravenous administration of Technetium 6m Sestamibi.  Rest ECG: NSR with non-specific ST-T wave changes  Stress Procedure: The patient received IV Lexiscan 0.4 mg over 15-seconds. Technetium 48m Sestamibi injected at 30-seconds. Quantitative spect images were obtained after a 45 minute delay.  Stress ECG: No significant change from baseline ECG  QPS  Raw Data Images: Normal; no motion artifact; normal heart/lung ratio.  Stress Images: There is decreased uptake in the inferior wall.  Rest Images: There is decreased uptake in the inferior wall.  Subtraction (SDS): There is a fixed inferior defect that is most consistent with diaphragmatic attenuation. Cannot  exclude small inferior wall scar. No significant reversibility. SDS 2.  Transient Ischemic Dilatation (Normal <1.22): n/a  Lung/Heart Ratio (Normal <0.45): 0.60  Quantitative Gated Spect Images  QGS EDV: n/a  QGS ESV: n/a  Impression  Exercise Capacity: Lexiscan with no exercise.  BP Response: Normal blood pressure response.  Clinical Symptoms: No significant symptoms noted.  ECG Impression: No significant ST segment change suggestive of ischemia.  Comparison with Prior Nuclear Study: No previous nuclear study performed  Overall Impression: Low risk stress nuclear study. No significant ischemia. Cannot exclude small old inferior wall scar vs diaphragmatic attenuation. No gating because of frequent PVCs.  LV Ejection Fraction: Study not gated. LV Wall Motion: Study not gated  [employer]      Assessment / Plan: 1. Chronic systolic congestive heart failure. Ejection fraction 20-25%. This is probably  related to chronic moonshine abuse. No significant ischemia or infarction noted on Myoview study. Patient is no longer drinking alcohol. He is on appropriate therapy with lisinopril, carvedilol, Aldactone, and Lasix.  We will continue sodium restriction 2 g daily. I recommended regular walking. I will check a basic metabolic panel, BNP level, and TSH today. I will followup again in 2 months to repeat an echocardiogram at that time. If there is no significant improvement in his LV function he would need to be considered for an ICD.  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 Hypertension-controlled  4. Glucose intolerance

## 2012-12-15 LAB — BASIC METABOLIC PANEL
BUN: 26 mg/dL — ABNORMAL HIGH (ref 6–23)
Chloride: 103 mEq/L (ref 96–112)
Potassium: 4.2 mEq/L (ref 3.5–5.1)
Sodium: 136 mEq/L (ref 135–145)

## 2012-12-15 LAB — TSH: TSH: 1.35 u[IU]/mL (ref 0.35–5.50)

## 2013-02-10 ENCOUNTER — Other Ambulatory Visit (HOSPITAL_COMMUNITY): Payer: Self-pay | Admitting: Cardiology

## 2013-02-10 ENCOUNTER — Ambulatory Visit (HOSPITAL_COMMUNITY): Payer: Medicare Other | Attending: Cardiology | Admitting: Radiology

## 2013-02-10 DIAGNOSIS — I5022 Chronic systolic (congestive) heart failure: Secondary | ICD-10-CM | POA: Insufficient documentation

## 2013-02-10 DIAGNOSIS — I509 Heart failure, unspecified: Secondary | ICD-10-CM

## 2013-02-10 NOTE — Progress Notes (Signed)
Echocardiogram performed.  

## 2013-02-17 ENCOUNTER — Ambulatory Visit (INDEPENDENT_AMBULATORY_CARE_PROVIDER_SITE_OTHER): Payer: Medicare Other | Admitting: Cardiology

## 2013-02-17 ENCOUNTER — Encounter: Payer: Self-pay | Admitting: Cardiology

## 2013-02-17 VITALS — BP 132/74 | HR 90 | Ht 72.0 in | Wt 232.0 lb

## 2013-02-17 DIAGNOSIS — I5022 Chronic systolic (congestive) heart failure: Secondary | ICD-10-CM | POA: Diagnosis not present

## 2013-02-17 DIAGNOSIS — I509 Heart failure, unspecified: Secondary | ICD-10-CM | POA: Diagnosis not present

## 2013-02-17 DIAGNOSIS — I5023 Acute on chronic systolic (congestive) heart failure: Secondary | ICD-10-CM

## 2013-02-17 DIAGNOSIS — I10 Essential (primary) hypertension: Secondary | ICD-10-CM

## 2013-02-17 DIAGNOSIS — I428 Other cardiomyopathies: Secondary | ICD-10-CM | POA: Diagnosis not present

## 2013-02-17 NOTE — Progress Notes (Signed)
Jacob Pittman Date of Birth: 1945-09-18 Medical Record #[id number]  History of Present Illness: Jacob Pittman is seen for followup of his congestive heart failure. 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 He reports he is feeling very well. He did notice last week that he had some shortness of breath and wheezing for a couple of days but this then resolved. He has had some increase in edema. He does not weigh himself at home but has gained 8 pounds by our scales. He remains abstinent from alcohol.  Current Outpatient Prescriptions on File Prior to Visit  Medication Sig Dispense Refill  . aspirin 81 MG EC tablet Take 81 mg by mouth daily.        . carvedilol (COREG) 12.5 MG tablet Take 1 tablet (12.5 mg total) by mouth 2 (two) times daily.  180 tablet  3  . furosemide (LASIX) 40 MG tablet Take 40 mg by mouth daily.       Marland Kitchen lisinopril (PRINIVIL,ZESTRIL) 20 MG tablet Take 1 tablet (20 mg total) by mouth daily.  90 tablet  3  . pravastatin (PRAVACHOL) 40 MG tablet Take 1 tablet (40 mg total) by mouth daily.  90 tablet  3  . spironolactone (ALDACTONE) 25 MG tablet Take 1 tablet (25 mg total) by mouth daily.  90 tablet  3   No current facility-administered medications on file prior to visit.    No Known Allergies  Past Medical History  Diagnosis Date  . DIABETES MELLITUS, TYPE II 11/07/2008  . GLUCOSE INTOLERANCE 10/08/2008  . HYPERLIPIDEMIA 11/07/2008  . ANXIETY 02/04/2009  . HYPERTENSION 10/08/2008  . 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 OSTEOARTHRITIS, KNEE, RIGHT 10/08/2008  . HIP PAIN, RIGHT 11/06/2009  . TOE PAIN 02/04/2009  . FATIGUE 10/08/2008  . Impaired glucose tolerance 08/17/2010  . Chronic systolic CHF (congestive heart failure)   . Nonischemic cardiomyopathy     Past Surgical History  Procedure Laterality Date  . S/p left broken arm with pin      1980's    History  Smoking status  . Former Smoker   Smokeless tobacco  . Not on file    History  Alcohol Use No    Family History  Problem Relation Age of Onset  . Diabetes Mother   . Hypertension Mother     Review of Systems: As noted in history of present illness.  All other systems were reviewed and are negative.  Physical Exam: BP 132/74  Pulse 90  Ht 6' (1.829 m)  Wt 232 lb (105.235 kg)  BMI 31.46 kg/m2 He is a very pleasant, obese black male in no acute distress. HEENT: Normal. Neck is supple without bruits, adenopathy, or thyromegaly. No JVD. Lungs: Clear Cardiovascular: Regular rate and rhythm. Normal S1 and S2. No gallop, murmur, or click. Abdomen: Obese, soft, nontender. Bowel sounds are positive. No bladder splenomegaly. Extremities: No cyanosis. 1+ bilateral pretibial edema. Pedal pulses are 2+. Skin: Warm and dry Neuro: Alert and oriented x3. Cranial nerves II through XII are intact. No focal findings.  LABORATORY DATA: Echo:Study Conclusions  - Left ventricle: The cavity size was severely dilated. Wall thickness was increased in a pattern of moderate LVH. The estimated ejection fraction was 20%. Diffuse hypokinesis. The study is not technically sufficient to allow evaluation of LV diastolic function. - Aortic valve: Trivial regurgitation. - Mitral valve: Mild regurgitation. - Left atrium: The atrium was moderately to severely dilated. - Right ventricle: The cavity size  was severely dilated. Systolic function was moderately reduced. - Right atrium: The atrium was moderately to severely dilated. - Pulmonary arteries: PA peak pressure: 32mm Hg (S). - Pericardium, extracardiac: A trivial pericardial effusion was identified posterior to the heart.    Assessment / Plan: 1. Chronic systolic congestive heart failure. Ejection fraction 20% despite optimal medical therapy. This is probably  related to chronic moonshine abuse. No significant ischemia or infarction noted on Myoview study. Patient is no longer  drinking alcohol. He is on appropriate therapy with lisinopril, carvedilol, Aldactone, and Lasix. We will continue sodium restriction 2 g daily. Given his increased weight I recommended increasing his Lasix to twice a day for the next 4 days then reducing it back to once a day. I recommended that he weigh himself daily and take an extra Lasix if he notes weight gain or increased swelling. I recommend referral to ED for consideration of ICD implant.  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 Hypertension-controlled  4. Glucose intolerance

## 2013-02-17 NOTE — Patient Instructions (Addendum)
Avoid salt.  Increase lasix to 40 mg twice a day for 4 days then once a day.  If you notice increased swelling or weight gain you may take an extra Lasix that day.  We will schedule an appointment to see our rhythm specialist to talk about a defibrillator.  I will see you in 4 months.  Keep a good lotion on your legs twice a day.

## 2013-02-23 ENCOUNTER — Telehealth: Payer: Self-pay | Admitting: *Deleted

## 2013-02-23 NOTE — Telephone Encounter (Signed)
Called Mr. Kalb to schedule EP consult per Dr. Swaziland. Mr. Alyea was offered an appointment on 03/02/13 @ 2pm or 03/08/13  12 noon. Patient  would like to wait until February 2015. I will forward message to [facility] Pugh/Jordan.

## 2013-02-23 NOTE — Telephone Encounter (Signed)
Returned call to patient advised he needs to keep appointment with Dr.Taylor 03/08/13.Patient stated he did not want to get a ICD,but he will keep appointment with Dr.Taylor 03/08/13.

## 2013-03-08 ENCOUNTER — Encounter: Payer: Self-pay | Admitting: Internal Medicine

## 2013-03-08 ENCOUNTER — Ambulatory Visit (INDEPENDENT_AMBULATORY_CARE_PROVIDER_SITE_OTHER): Payer: Medicare Other | Admitting: Internal Medicine

## 2013-03-08 VITALS — BP 130/77 | HR 67 | Ht 73.0 in | Wt 210.1 lb

## 2013-03-08 DIAGNOSIS — I509 Heart failure, unspecified: Secondary | ICD-10-CM | POA: Diagnosis not present

## 2013-03-08 DIAGNOSIS — I10 Essential (primary) hypertension: Secondary | ICD-10-CM

## 2013-03-08 DIAGNOSIS — I5022 Chronic systolic (congestive) heart failure: Secondary | ICD-10-CM

## 2013-03-08 NOTE — Progress Notes (Signed)
HPI Jacob Pittman is referred today for consideration for ICD implantation for primary prevention of malignant cardiac arrhythmias. The patient is a 67 year old man who was diagnosed with congestive heart failure 6 months ago. An echo initially demonstrated an ejection fraction of 20%. Prior to this, the patient had abused alcohol for many years. He stopped drinking alcohol in April of this year. He was initiated on the usual heart failure medications. He has been compliant. His heart failure is now class II. Repeat echocardiography performed approximately 6 months after his initial diagnosis demonstrated an ejection fraction that remained at 20%. He denies anginal symptoms and has never had syncope. His QRS duration is not prolonged. There is no family history of sudden cardiac death. No Known Allergies   Current Outpatient Prescriptions  Medication Sig Dispense Refill  . aspirin 81 MG EC tablet Take 81 mg by mouth daily.        . carvedilol (COREG) 12.5 MG tablet Take 1 tablet (12.5 mg total) by mouth 2 (two) times daily.  180 tablet  3  . furosemide (LASIX) 40 MG tablet Take 40 mg by mouth daily.       Marland Kitchen lisinopril (PRINIVIL,ZESTRIL) 20 MG tablet Take 1 tablet (20 mg total) by mouth daily.  90 tablet  3  . pravastatin (PRAVACHOL) 40 MG tablet Take 1 tablet (40 mg total) by mouth daily.  90 tablet  3  . spironolactone (ALDACTONE) 25 MG tablet Take 1 tablet (25 mg total) by mouth daily.  90 tablet  3   No current facility-administered medications for this visit.     Past Medical History  Diagnosis Date  . DIABETES MELLITUS, TYPE II 11/07/2008  . GLUCOSE INTOLERANCE 10/08/2008  . HYPERLIPIDEMIA 11/07/2008  . ANXIETY 02/04/2009  . HYPERTENSION 10/08/2008  . 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 OSTEOARTHRITIS, KNEE, RIGHT 10/08/2008  . HIP PAIN, RIGHT 11/06/2009  . TOE PAIN 02/04/2009  . FATIGUE 10/08/2008  . Impaired glucose tolerance 08/17/2010  . Chronic systolic CHF  (congestive heart failure)   . Nonischemic cardiomyopathy     ROS:   All systems reviewed and negative except as noted in the HPI.   Past Surgical History  Procedure Laterality Date  . S/p left broken arm with pin      1980's     Family History  Problem Relation Age of Onset  . Diabetes Mother   . Hypertension Mother      History   Social History  . Marital Status: Married    Spouse Name: N/A    Number of Children: 2  . Years of Education: N/A   Occupational History  . retired [employer]    Social History Main Topics  . Smoking status: Former Games developer  . Smokeless tobacco: Not on file  . Alcohol Use: No  . Drug Use: No  . Sexual Activity: No   Other Topics Concern  . Not on file   Social History Narrative   Married/separated although wife comes to appts. With him   10th grade education - poor reading and writing ability   Incarcerated for 6 months in 2009.     BP 130/77  Pulse 67  Ht 6\' 1"  (1.854 m)  Wt 210 lb 1.9 oz (95.31 kg)  BMI 27.73 kg/m2  Physical Exam:  Well appearing 67 year old man, NAD HEENT: Unremarkable Neck:  No JVD, no thyromegally Back:  No CVA tenderness Lungs:  Clear with no wheezes, rales, or rhonchi. HEART:  Regular rate rhythm, no murmurs, no rubs, no clicks, PMI is enlarged. There is a soft S4 gallop. Abd:  soft, positive bowel sounds, no organomegally, no rebound, no guarding Ext:  2 plus pulses, no edema, no cyanosis, no clubbing Skin:  No rashes no nodules Neuro:  CN II through XII intact, motor grossly intact  EKG - normal sinus rhythm  Assess/Plan:

## 2013-03-08 NOTE — Assessment & Plan Note (Signed)
His blood pressure is well controlled. He will continue his current medical therapy, and maintain a low-sodium diet.

## 2013-03-08 NOTE — Patient Instructions (Signed)
Your physician has recommended that you have a defibrillator inserted. An implantable cardioverter defibrillator (ICD) is a small device that is placed in your chest or, in rare cases, your abdomen. This device uses electrical pulses or shocks to help control life-threatening, irregular heartbeats that could lead the heart to suddenly stop beating (sudden cardiac arrest). Leads are attached to the ICD that goes into your heart. This is done in the hospital and usually requires an overnight stay. Please see the instruction sheet given to you today for more information.    Dates in Dec 12/04,12/10,12/11,12/17,12/19,12/22,12/23,12/24,12/26  Call Anselm Pancoast to schedule [phone]

## 2013-03-08 NOTE — Assessment & Plan Note (Signed)
His symptoms are currently class II, and he is been maintained on maximal medical therapy with diuretics, Aldactone , carvedilol, and lisinopril. His ejection fraction remained at 20%. I discussed the treatment options with the patient and his daughter. The risk, goals, benefits, and expectations of prophylactic ICD implantation have been discussed in detail. He is considering his options and will call us if you would like to schedule ICD implantation.

## 2013-03-29 ENCOUNTER — Telehealth: Payer: Self-pay | Admitting: Internal Medicine

## 2013-03-29 DIAGNOSIS — I5022 Chronic systolic (congestive) heart failure: Secondary | ICD-10-CM

## 2013-03-29 NOTE — Telephone Encounter (Signed)
New message     Want to have ICD procedure on 04-10-13 if possible.

## 2013-03-29 NOTE — Telephone Encounter (Signed)
Spoke with patient's wife  Have his procedure scheduled for 04/10/13  Will have his labs drawn on 04/03/13 at 9am

## 2013-03-31 ENCOUNTER — Other Ambulatory Visit: Payer: Self-pay | Admitting: *Deleted

## 2013-03-31 ENCOUNTER — Encounter: Payer: Self-pay | Admitting: *Deleted

## 2013-03-31 DIAGNOSIS — I519 Heart disease, unspecified: Secondary | ICD-10-CM

## 2013-03-31 DIAGNOSIS — I509 Heart failure, unspecified: Secondary | ICD-10-CM

## 2013-04-03 ENCOUNTER — Other Ambulatory Visit (INDEPENDENT_AMBULATORY_CARE_PROVIDER_SITE_OTHER): Payer: Medicare Other

## 2013-04-03 DIAGNOSIS — I5022 Chronic systolic (congestive) heart failure: Secondary | ICD-10-CM | POA: Diagnosis not present

## 2013-04-03 LAB — CBC WITH DIFFERENTIAL/PLATELET
Basophils Relative: 0.2 % (ref 0.0–3.0)
Eosinophils Absolute: 0.3 10*3/uL (ref 0.0–0.7)
HCT: 43.8 % (ref 39.0–52.0)
Lymphocytes Relative: 34.1 % (ref 12.0–46.0)
Lymphs Abs: 1.9 10*3/uL (ref 0.7–4.0)
MCHC: 33.5 g/dL (ref 30.0–36.0)
MCV: 97.4 fl (ref 78.0–100.0)
Monocytes Absolute: 0.6 10*3/uL (ref 0.1–1.0)
Neutro Abs: 2.8 10*3/uL (ref 1.4–7.7)
Neutrophils Relative %: 50.1 % (ref 43.0–77.0)
RBC: 4.5 Mil/uL (ref 4.22–5.81)
WBC: 5.5 10*3/uL (ref 4.5–10.5)

## 2013-04-03 LAB — BASIC METABOLIC PANEL
CO2: 26 mEq/L (ref 19–32)
Chloride: 102 mEq/L (ref 96–112)
Creatinine, Ser: 1 mg/dL (ref 0.4–1.5)
Glucose, Bld: 189 mg/dL — ABNORMAL HIGH (ref 70–99)

## 2013-04-03 LAB — PROTIME-INR: INR: 1.1 ratio — ABNORMAL HIGH (ref 0.8–1.0)

## 2013-04-06 ENCOUNTER — Encounter (HOSPITAL_COMMUNITY): Payer: Self-pay | Admitting: Pharmacy Technician

## 2013-04-09 DIAGNOSIS — I509 Heart failure, unspecified: Secondary | ICD-10-CM | POA: Diagnosis not present

## 2013-04-09 DIAGNOSIS — I5022 Chronic systolic (congestive) heart failure: Secondary | ICD-10-CM | POA: Diagnosis not present

## 2013-04-09 DIAGNOSIS — E785 Hyperlipidemia, unspecified: Secondary | ICD-10-CM | POA: Diagnosis not present

## 2013-04-09 DIAGNOSIS — I428 Other cardiomyopathies: Secondary | ICD-10-CM | POA: Diagnosis not present

## 2013-04-09 DIAGNOSIS — E119 Type 2 diabetes mellitus without complications: Secondary | ICD-10-CM | POA: Diagnosis not present

## 2013-04-09 DIAGNOSIS — Z87891 Personal history of nicotine dependence: Secondary | ICD-10-CM | POA: Diagnosis not present

## 2013-04-09 DIAGNOSIS — Z7982 Long term (current) use of aspirin: Secondary | ICD-10-CM | POA: Diagnosis not present

## 2013-04-09 DIAGNOSIS — I10 Essential (primary) hypertension: Secondary | ICD-10-CM | POA: Diagnosis not present

## 2013-04-09 MED ORDER — SODIUM CHLORIDE 0.9 % IR SOLN
80.0000 mg | Status: DC
  Filled 2013-04-09: qty 2

## 2013-04-09 MED ORDER — CEFAZOLIN SODIUM-DEXTROSE 2-3 GM-% IV SOLR
2.0000 g | INTRAVENOUS | Status: DC
  Filled 2013-04-09: qty 50

## 2013-04-10 ENCOUNTER — Encounter (HOSPITAL_COMMUNITY)
Admission: RE | Disposition: A | Discharge status: Home / Self Care | Payer: Self-pay | Source: Ambulatory Visit | Attending: Internal Medicine

## 2013-04-10 ENCOUNTER — Encounter (HOSPITAL_COMMUNITY): Payer: Self-pay | Admitting: Emergency Medicine

## 2013-04-10 ENCOUNTER — Ambulatory Visit (HOSPITAL_COMMUNITY)
Admission: RE | Admit: 2013-04-10 | Discharge: 2013-04-11 | Disposition: A | Discharge status: Home / Self Care | Payer: Medicare Other | Source: Ambulatory Visit | Attending: Internal Medicine | Admitting: Internal Medicine

## 2013-04-10 DIAGNOSIS — I509 Heart failure, unspecified: Secondary | ICD-10-CM

## 2013-04-10 DIAGNOSIS — Z7982 Long term (current) use of aspirin: Secondary | ICD-10-CM | POA: Insufficient documentation

## 2013-04-10 DIAGNOSIS — I428 Other cardiomyopathies: Secondary | ICD-10-CM | POA: Insufficient documentation

## 2013-04-10 DIAGNOSIS — E119 Type 2 diabetes mellitus without complications: Secondary | ICD-10-CM | POA: Diagnosis not present

## 2013-04-10 DIAGNOSIS — E785 Hyperlipidemia, unspecified: Secondary | ICD-10-CM | POA: Diagnosis not present

## 2013-04-10 DIAGNOSIS — I5022 Chronic systolic (congestive) heart failure: Secondary | ICD-10-CM | POA: Diagnosis not present

## 2013-04-10 DIAGNOSIS — Z87891 Personal history of nicotine dependence: Secondary | ICD-10-CM | POA: Insufficient documentation

## 2013-04-10 DIAGNOSIS — I10 Essential (primary) hypertension: Secondary | ICD-10-CM | POA: Diagnosis not present

## 2013-04-10 DIAGNOSIS — I5042 Chronic combined systolic (congestive) and diastolic (congestive) heart failure: Secondary | ICD-10-CM | POA: Diagnosis present

## 2013-04-10 DIAGNOSIS — I519 Heart disease, unspecified: Secondary | ICD-10-CM

## 2013-04-10 HISTORY — PX: CARDIAC DEFIBRILLATOR PLACEMENT: SHX171

## 2013-04-10 HISTORY — PX: IMPLANTABLE CARDIOVERTER DEFIBRILLATOR IMPLANT: SHX5473

## 2013-04-10 LAB — SURGICAL PCR SCREEN: Staphylococcus aureus: NEGATIVE

## 2013-04-10 SURGERY — IMPLANTABLE CARDIOVERTER DEFIBRILLATOR IMPLANT
Anesthesia: LOCAL

## 2013-04-10 MED ORDER — MIDAZOLAM HCL 5 MG/5ML IJ SOLN
INTRAMUSCULAR | Status: AC
  Filled 2013-04-10: qty 5

## 2013-04-10 MED ORDER — CARVEDILOL 12.5 MG PO TABS
12.5000 mg | ORAL_TABLET | Freq: Two times a day (BID) | ORAL | Status: DC
Start: 2013-04-10 — End: 2013-04-11
  Administered 2013-04-10 – 2013-04-11 (×3): 12.5 mg via ORAL
  Filled 2013-04-10 (×5): qty 1

## 2013-04-10 MED ORDER — LIDOCAINE HCL (PF) 1 % IJ SOLN
INTRAMUSCULAR | Status: AC
  Filled 2013-04-10: qty 60

## 2013-04-10 MED ORDER — SPIRONOLACTONE 25 MG PO TABS
25.0000 mg | ORAL_TABLET | Freq: Every day | ORAL | Status: DC
  Administered 2013-04-10: 25 mg via ORAL
  Filled 2013-04-10 (×2): qty 1

## 2013-04-10 MED ORDER — ONDANSETRON HCL 4 MG/2ML IJ SOLN: 4.0000 mg | Freq: Four times a day (QID) | INTRAMUSCULAR | Status: DC | PRN

## 2013-04-10 MED ORDER — CHLORHEXIDINE GLUCONATE 4 % EX LIQD
60.0000 mL | Freq: Once | CUTANEOUS | Status: DC
  Filled 2013-04-10: qty 60

## 2013-04-10 MED ORDER — MUPIROCIN 2 % EX OINT
TOPICAL_OINTMENT | CUTANEOUS | Status: AC
  Filled 2013-04-10: qty 22

## 2013-04-10 MED ORDER — FUROSEMIDE 40 MG PO TABS
40.0000 mg | ORAL_TABLET | Freq: Every day | ORAL | Status: DC
  Administered 2013-04-10: 40 mg via ORAL
  Filled 2013-04-10 (×2): qty 1

## 2013-04-10 MED ORDER — SODIUM CHLORIDE 0.9 % IV SOLN
INTRAVENOUS | Status: DC
  Administered 2013-04-10: 06:00:00 via INTRAVENOUS

## 2013-04-10 MED ORDER — FENTANYL CITRATE 0.05 MG/ML IJ SOLN
INTRAMUSCULAR | Status: AC
  Filled 2013-04-10: qty 2

## 2013-04-10 MED ORDER — ACETAMINOPHEN 325 MG PO TABS: 325.0000 mg | ORAL_TABLET | ORAL | Status: DC | PRN

## 2013-04-10 MED ORDER — SIMVASTATIN 5 MG PO TABS
5.0000 mg | ORAL_TABLET | Freq: Every day | ORAL | Status: DC
  Administered 2013-04-10: 5 mg via ORAL
  Filled 2013-04-10 (×2): qty 1

## 2013-04-10 MED ORDER — LISINOPRIL 20 MG PO TABS
20.0000 mg | ORAL_TABLET | Freq: Every day | ORAL | Status: DC
  Administered 2013-04-10: 20 mg via ORAL
  Filled 2013-04-10 (×2): qty 1

## 2013-04-10 MED ORDER — CEFAZOLIN SODIUM-DEXTROSE 2-3 GM-% IV SOLR
2.0000 g | Freq: Four times a day (QID) | INTRAVENOUS | Status: AC
  Administered 2013-04-10 (×3): 2 g via INTRAVENOUS
  Filled 2013-04-10 (×3): qty 50

## 2013-04-10 MED ORDER — MUPIROCIN 2 % EX OINT
TOPICAL_OINTMENT | Freq: Two times a day (BID) | CUTANEOUS | Status: DC
  Administered 2013-04-10: 1 via NASAL
  Filled 2013-04-10: qty 22

## 2013-04-10 NOTE — Discharge Summary (Signed)
ELECTROPHYSIOLOGY PROCEDURE DISCHARGE SUMMARY    Patient ID: Jacob Pittman,  MRN: [id number], DOB/AGE: Sep 08, 1945 67 y.o.  Admit date: 04/10/2013 Discharge date: 04/11/2013  Primary Care Physician: Oliver Barre, MD Primary Cardiologist: Peter Swaziland, MD Electrophysiologist: Lewayne Bunting, MD  Primary Discharge Diagnosis:  Non ischemic cardiomyopathy status post ICD implantation this admission  Secondary Discharge Diagnosis:  1.  Type II diabetes 2.  Hyperlipidemia 3.  Hypertension  No Known Allergies   Procedures This Admission:  1.  Implantation of a VDD Biotronik single chamber ICD on 04-10-2013 by Dr Ladona Ridgel.  The patient received a Biotronik model number Ilesto VR-T ICD with model number Linox Smart SDX right ventricular lead.  DFT's were successful at 20 J.  There were no immediate post procedure complications. 2.  CXR on 04-11-2013 demonstrated no pneumothorax status post device implantation.   Brief HPI: Jacob Pittman is a 67 y.o. male was referred to electrophysiology in the outpatient setting for consideration of ICD implantation.  Past medical history includes nonischemic cardiomyopathy, diabetes, and hypertension.  The patient has persistent LV dysfunction despite guideline directed therapy.  Risks, benefits, and alternatives to ICD implantation were reviewed with the patient who wished to proceed.   Hospital Course:  The patient was admitted and underwent implantation of a single chamber Biotronik ICD with details as outlined above.   He was monitored on telemetry overnight which demonstrated sinus rhythm with PVC's and short run of NSVT.  Left chest was without hematoma or ecchymosis.  The device was interrogated and found to be functioning normally.  CXR was obtained and demonstrated no pneumothorax status post device implantation.  Wound care, arm mobility, and restrictions were reviewed with the patient.  Dr Ladona Ridgel examined the patient and considered them stable  for discharge to home.   The patient's discharge medications include an ACE-I (Lisinopril) and beta blocker (Coreg).   Discharge Vitals: Blood pressure 111/63, pulse 63, temperature 97.9 F (36.6 C), temperature source Oral, resp. rate 18, height 6\' 1"  (1.854 m), weight 210 lb (95.255 kg), SpO2 98.00%.  Labs:   Lab Results  Component Value Date   WBC 5.5 04/03/2013   HGB 14.7 04/03/2013   HCT 43.8 04/03/2013   MCV 97.4 04/03/2013   PLT 155.0 04/03/2013    Discharge Medications:    Medication List    ASK your doctor about these medications       aspirin 81 MG EC tablet  Take 81 mg by mouth daily.     carvedilol 12.5 MG tablet  Commonly known as:  COREG  Take 1 tablet (12.5 mg total) by mouth 2 (two) times daily.     furosemide 40 MG tablet  Commonly known as:  LASIX  Take 40 mg by mouth daily.     lisinopril 20 MG tablet  Commonly known as:  PRINIVIL,ZESTRIL  Take 1 tablet (20 mg total) by mouth daily.     pravastatin 40 MG tablet  Commonly known as:  PRAVACHOL  Take 1 tablet (40 mg total) by mouth daily.     spironolactone 25 MG tablet  Commonly known as:  ALDACTONE  Take 1 tablet (25 mg total) by mouth daily.        Disposition:   Future Appointments Provider Department Dept Phone   04/19/2013 11:30 AM [facility] [place] Office [phone]   05/12/2013 2:30 PM [name], MD [facility] [place] [phone]   05/19/2013 2:15 PM Peter M Swaziland, MD  [facility] [place] Office [phone]   07/12/2013 8:45 AM Marinus Maw, MD [facility] Office [phone]       Duration of Discharge Encounter: Greater than 30 minutes including physician time.  Signed,  Leonia Reeves.D.

## 2013-04-10 NOTE — Op Note (Signed)
NAMECLAIRE, Jacob Pittman             ACCOUNT NO.:  [id number]  MEDICAL RECORD NO.:  [id number]  LOCATION:  3W35C                        FACILITY:  [facility]  PHYSICIAN:  Doylene Canning. Ladona Ridgel, MD    DATE OF BIRTH:  [birth year]  DATE OF PROCEDURE:  04/10/2013 DATE OF DISCHARGE:                              OPERATIVE REPORT   PROCEDURE PERFORMED:  Insertion of a Biotronik single chamber dual sensor ICD.  INTRODUCTION:  The patient is a 67 year old male with a longstanding nonischemic cardiomyopathy, chronic systolic heart failure, ejection fraction 20% despite maximal medical therapy.  He is now referred for insertion of an ICD for primary prevention of malignant ventricular arrhythmias.  DESCRIPTION OF PROCEDURE:  After informed consent was obtained, the patient was taken to the diagnostic EP lab in a fasting state.  After usual preparation and draping, intravenous fentanyl and midazolam was given for sedation.  A 30 mL of lidocaine was infiltrated into the left infraclavicular region.  A 7-cm incision was carried out over this region.  Electrocautery was utilized to dissect down to the fascial plane.  The left subclavian vein was punctured and a Biotronik Linox smart SDX dual sensor single defibrillator lead was advanced under fluoroscopic guidance into the right ventricle.  Mapping was carried out in the right ventricle at the final site of the RV septum, the R-waves measured 17 mV and with the lead actively fixed, the pacing impedance was 430 ohms.  The threshold was 0.4 V at 0.4 milliseconds and there was a large injury current with active fixation of the lead.  With these satisfactory parameters, the atrial bipolar was evaluated and the P- waves were measured as 2 mV.  The lead was actively fixed and the silk suture was utilized the leads to the fascial plane.  The sewing sleeve was also secured with silk suture.  At this point, electrocautery was utilized to make a subcutaneous pocket.   Antibiotic irrigation was utilized to irrigate the pocket and electrocautery was utilized to assure hemostasis.  The Biotronik Ilesto 7VR-T DX defibrillator, serial [id number] was connected to the defibrillation lead and placed back in the subcutaneous pocket.  The pocket was irrigated with antibiotic irrigation, and the incision was closed with 2-0 and 3-0 Vicryl. Benzoin and Steri-Strips were painted on the skin, and at this point, I scrubbed out of the case to surprise defibrillation and threshold testing.  After the patient was more deeply sedated with additional intravenous fentanyl and Versed under my direct supervision, ventricular fibrillation was induced with a T-wave shock.  A 20-joule shock was subsequently delivered, which terminated ventricular fibrillation and restored in sinus rhythm.  There was appropriate sensing and the shock impedance was 66 ohms.  At this point, a pressure dressing was placed over the device and the patient was returned to his room in satisfactory condition.  COMPLICATIONS:  There were no immediate procedure complications.  RESULTS:  This demonstrates successful implantation of a single-chamber defibrillator in a patient with a longstanding non-ischemic cardiomyopathy, chronic systolic heart failure, ejection fraction 20%.     Doylene Canning. Ladona Ridgel, MD     GWT/MEDQ  D:  04/10/2013  T:  04/10/2013  Job:  [id number]  cc:  Peter M. Swaziland, M.D.

## 2013-04-10 NOTE — Interval H&P Note (Signed)
History and Physical Interval Note: Since last clinic visit, there has been no change in the history, physical exam, assessment and plan.   04/10/2013 7:11 AM  [name]  has presented today for surgery, with the diagnosis of hf  The various methods of treatment have been discussed with the patient and family. After consideration of risks, benefits and other options for treatment, the patient has consented to  Procedure(s): IMPLANTABLE CARDIOVERTER DEFIBRILLATOR IMPLANT (N/A) as a surgical intervention .  The patient's history has been reviewed, patient examined, no change in status, stable for surgery.  I have reviewed the patient's chart and labs.  Questions were answered to the patient's satisfaction.     Leonia Reeves.D.

## 2013-04-10 NOTE — H&P (Signed)
  ICD Criteria  Current LVEF (within 6 months):20%  NYHA Functional Classification: Class II  Heart Failure History:  Yes, Duration of heart failure since onset is 3 to 9 months  Non-Ischemic Dilated Cardiomyopathy History:  Yes, timeframe is 3 to 9 months  Atrial Fibrillation/Atrial Flutter:  No.  Ventricular Tachycardia History:  No.  Cardiac Arrest History:  No  History of Syndromes with Risk of Sudden Death:  No.  Previous ICD:  No.  Electrophysiology Study: No.  Prior MI: No.  PPM: No.  OSA:  No  Patient Life Expectancy of >=1 year: Yes.  Anticoagulation Therapy:  Patient is NOT on anticoagulation therapy.   Beta Blocker Therapy:  Yes.   Ace Inhibitor/ARB Therapy:  Yes.

## 2013-04-10 NOTE — CV Procedure (Signed)
VDD ICD implant via the left subclavian vein without immediate complication. [id number].

## 2013-04-10 NOTE — H&P (Signed)
HPI [name] is referred today for consideration for ICD implantation for primary prevention of malignant cardiac arrhythmias. The patient is a 67 year old man who was diagnosed with congestive heart failure 6 months ago. An echo initially demonstrated an ejection fraction of 20%. Prior to this, the patient had abused alcohol for many years. He stopped drinking alcohol in April of this year. He was initiated on the usual heart failure medications. He has been compliant. His heart failure is now class II. Repeat echocardiography performed approximately 6 months after his initial diagnosis demonstrated an ejection fraction that remained at 20%. He denies anginal symptoms and has never had syncope. His QRS duration is not prolonged. There is no family history of sudden cardiac death. No Known Allergies      Current Outpatient Prescriptions   Medication  Sig  Dispense  Refill   .  aspirin 81 MG EC tablet  Take 81 mg by mouth daily.           .  carvedilol (COREG) 12.5 MG tablet  Take 1 tablet (12.5 mg total) by mouth 2 (two) times daily.   180 tablet   3   .  furosemide (LASIX) 40 MG tablet  Take 40 mg by mouth daily.          Marland Kitchen  lisinopril (PRINIVIL,ZESTRIL) 20 MG tablet  Take 1 tablet (20 mg total) by mouth daily.   90 tablet   3   .  pravastatin (PRAVACHOL) 40 MG tablet  Take 1 tablet (40 mg total) by mouth daily.   90 tablet   3   .  spironolactone (ALDACTONE) 25 MG tablet  Take 1 tablet (25 mg total) by mouth daily.   90 tablet   3       No current facility-administered medications for this visit.           Past Medical History   Diagnosis  Date   .  DIABETES MELLITUS, TYPE II  11/07/2008   .  GLUCOSE INTOLERANCE  10/08/2008   .  HYPERLIPIDEMIA  11/07/2008   .  ANXIETY  02/04/2009   .  HYPERTENSION  10/08/2008   .  ERECTILE DYSFUNCTION, ORGANIC  11/07/2008   .  OSTEOARTHRITIS, KNEE, RIGHT  10/08/2008   .  HIP PAIN, RIGHT  11/06/2009   .  TOE PAIN  02/04/2009   .  FATIGUE  10/08/2008    .  Impaired glucose tolerance  08/17/2010   .  Chronic systolic CHF (congestive heart failure)     .  Nonischemic cardiomyopathy          ROS:    All systems reviewed and negative except as noted in the HPI.      Past Surgical History   Procedure  Laterality  Date   .  S/p left broken arm with pin           1980's           Family History   Problem  Relation  Age of Onset   .  Diabetes  Mother     .  Hypertension  Mother             History       Social History   .  Marital Status:  Married       Spouse Name:  N/A       Number of Children:  2   .  Years of Education:  N/A  Occupational History   .  retired [employer]         Social History Main Topics   .  Smoking status:  Former Games developer   .  Smokeless tobacco:  Not on file   .  Alcohol Use:  No   .  Drug Use:  No   .  Sexual Activity:  No       Other Topics  Concern   .  Not on file       Social History Narrative     Married/separated although wife comes to appts. With him     10th grade education - poor reading and writing ability     Incarcerated for 6 months in 2009.          BP 130/77  Pulse 67  Ht 6\' 1"  (1.854 m)  Wt 210 lb 1.9 oz (95.31 kg)  BMI 27.73 kg/m2   Physical Exam:   Well appearing 67 year old man, NAD HEENT: Unremarkable Neck:  No JVD, no thyromegally Back:  No CVA tenderness Lungs:  Clear with no wheezes, rales, or rhonchi. HEART:  Regular rate rhythm, no murmurs, no rubs, no clicks, PMI is enlarged. There is a soft S4 gallop. Abd:  soft, positive bowel sounds, no organomegally, no rebound, no guarding Ext:  2 plus pulses, no edema, no cyanosis, no clubbing Skin:  No rashes no nodules Neuro:  CN II through XII intact, motor grossly intact   EKG - normal sinus rhythm   Assess/Plan:         Chronic systolic CHF (congestive heart failure) - Jacob Maw, MD at 03/08/2013  1:28 PM    Status: Written Related Problem: Chronic systolic CHF  (congestive heart failure)    His symptoms are currently class II, and he is been maintained on maximal medical therapy with diuretics, Aldactone , carvedilol, and lisinopril. His ejection fraction remained at 20%. I discussed the treatment options with the patient and his daughter. The risk, goals, benefits, and expectations of prophylactic ICD implantation have been discussed in detail. He is considering his options and will call us if you would like to schedule ICD implantation.         HYPERTENSION - Jacob Maw, MD at 03/08/2013  1:28 PM    Status: Written Related Problem: HYPERTENSION    His blood pressure is well controlled. He will continue his current medical therapy, and maintain a low-sodium diet.

## 2013-04-11 ENCOUNTER — Ambulatory Visit (HOSPITAL_COMMUNITY): Payer: Medicare Other

## 2013-04-11 DIAGNOSIS — I428 Other cardiomyopathies: Secondary | ICD-10-CM | POA: Diagnosis not present

## 2013-04-11 DIAGNOSIS — I509 Heart failure, unspecified: Secondary | ICD-10-CM | POA: Diagnosis not present

## 2013-04-11 DIAGNOSIS — E785 Hyperlipidemia, unspecified: Secondary | ICD-10-CM | POA: Diagnosis not present

## 2013-04-11 DIAGNOSIS — I5022 Chronic systolic (congestive) heart failure: Secondary | ICD-10-CM | POA: Diagnosis not present

## 2013-04-11 DIAGNOSIS — R918 Other nonspecific abnormal finding of lung field: Secondary | ICD-10-CM | POA: Diagnosis not present

## 2013-04-11 DIAGNOSIS — I519 Heart disease, unspecified: Secondary | ICD-10-CM | POA: Diagnosis not present

## 2013-04-11 DIAGNOSIS — E119 Type 2 diabetes mellitus without complications: Secondary | ICD-10-CM | POA: Diagnosis not present

## 2013-04-11 DIAGNOSIS — I10 Essential (primary) hypertension: Secondary | ICD-10-CM | POA: Diagnosis not present

## 2013-04-11 NOTE — Progress Notes (Signed)
Reviewed discharge instructions with patient and he stated his understanding.  Arm restrictions reviewed with patient also, stating his understanding.  Discharged home with family.  Jacob Pittman

## 2013-04-14 ENCOUNTER — Encounter (HOSPITAL_COMMUNITY): Payer: Self-pay | Admitting: *Deleted

## 2013-04-19 ENCOUNTER — Encounter: Payer: Self-pay | Admitting: Internal Medicine

## 2013-04-19 ENCOUNTER — Ambulatory Visit (INDEPENDENT_AMBULATORY_CARE_PROVIDER_SITE_OTHER): Payer: Medicare Other | Admitting: *Deleted

## 2013-04-19 DIAGNOSIS — I428 Other cardiomyopathies: Secondary | ICD-10-CM

## 2013-04-19 DIAGNOSIS — I5022 Chronic systolic (congestive) heart failure: Secondary | ICD-10-CM

## 2013-04-19 LAB — MDC_IDC_ENUM_SESS_TYPE_INCLINIC
Battery Voltage: 3.11 V
Implantable Pulse Generator Model: 383594
Implantable Pulse Generator Serial Number: 60733905
Lead Channel Impedance Value: 493 Ohm
Lead Channel Pacing Threshold Amplitude: 0.4 V
Lead Channel Sensing Intrinsic Amplitude: 0.8 mV
Lead Channel Sensing Intrinsic Amplitude: 15.1 mV
Lead Channel Setting Pacing Amplitude: 3 V

## 2013-04-19 NOTE — Progress Notes (Signed)
Pt seen in device clinic for follow up of recently implanted ICD.  No redness, swelling, or edema.  Steri-strips removed today. Removed 1 suture---slight incision opening, reapplied steri-strips.  Device interrogated by industry and found to be functioning normally.  No changes made today. See PaceArt for full details.  ROV w/ device clinic for wound re-check only 04/26/13 & ROV w/ Dr. Ladona Ridgel 07/12/13.   Jacob Pittman 04/19/2013 12:10 PM

## 2013-04-26 ENCOUNTER — Ambulatory Visit (INDEPENDENT_AMBULATORY_CARE_PROVIDER_SITE_OTHER): Payer: Medicare Other | Admitting: *Deleted

## 2013-04-26 ENCOUNTER — Encounter: Payer: Self-pay | Admitting: *Deleted

## 2013-04-26 DIAGNOSIS — I428 Other cardiomyopathies: Secondary | ICD-10-CM

## 2013-04-26 DIAGNOSIS — I5022 Chronic systolic (congestive) heart failure: Secondary | ICD-10-CM

## 2013-04-26 NOTE — Progress Notes (Signed)
Wound recheck today.  Steri-strips removed and wound appears to be well healed.  Follow up as scheduled.

## 2013-05-04 ENCOUNTER — Encounter: Payer: Self-pay | Admitting: Cardiology

## 2013-05-12 ENCOUNTER — Other Ambulatory Visit (INDEPENDENT_AMBULATORY_CARE_PROVIDER_SITE_OTHER): Payer: Medicare Other

## 2013-05-12 ENCOUNTER — Encounter: Payer: Self-pay | Admitting: Internal Medicine

## 2013-05-12 ENCOUNTER — Ambulatory Visit (INDEPENDENT_AMBULATORY_CARE_PROVIDER_SITE_OTHER): Payer: Medicare Other | Admitting: Internal Medicine

## 2013-05-12 VITALS — BP 120/70 | HR 69 | Temp 97.5°F | Ht 73.0 in | Wt 215.2 lb

## 2013-05-12 DIAGNOSIS — N320 Bladder-neck obstruction: Secondary | ICD-10-CM

## 2013-05-12 DIAGNOSIS — I10 Essential (primary) hypertension: Secondary | ICD-10-CM | POA: Diagnosis not present

## 2013-05-12 DIAGNOSIS — E119 Type 2 diabetes mellitus without complications: Secondary | ICD-10-CM

## 2013-05-12 DIAGNOSIS — Z23 Encounter for immunization: Secondary | ICD-10-CM | POA: Diagnosis not present

## 2013-05-12 DIAGNOSIS — E785 Hyperlipidemia, unspecified: Secondary | ICD-10-CM

## 2013-05-12 LAB — CBC WITH DIFFERENTIAL/PLATELET
BASOS ABS: 0 10*3/uL (ref 0.0–0.1)
Basophils Relative: 0.1 % (ref 0.0–3.0)
Eosinophils Absolute: 0.3 10*3/uL (ref 0.0–0.7)
Eosinophils Relative: 4.5 % (ref 0.0–5.0)
HEMATOCRIT: 39.1 % (ref 39.0–52.0)
Hemoglobin: 13.3 g/dL (ref 13.0–17.0)
Lymphocytes Relative: 33.6 % (ref 12.0–46.0)
Lymphs Abs: 1.9 10*3/uL (ref 0.7–4.0)
MCHC: 34 g/dL (ref 30.0–36.0)
MCV: 95.5 fl (ref 78.0–100.0)
Monocytes Absolute: 0.6 10*3/uL (ref 0.1–1.0)
Monocytes Relative: 11.6 % (ref 3.0–12.0)
Neutro Abs: 2.8 10*3/uL (ref 1.4–7.7)
Neutrophils Relative %: 50.2 % (ref 43.0–77.0)
Platelets: 173 10*3/uL (ref 150.0–400.0)
RBC: 4.1 Mil/uL — ABNORMAL LOW (ref 4.22–5.81)
RDW: 14.1 % (ref 11.5–14.6)
WBC: 5.5 10*3/uL (ref 4.5–10.5)

## 2013-05-12 LAB — URINALYSIS, ROUTINE W REFLEX MICROSCOPIC
BILIRUBIN URINE: NEGATIVE
Hgb urine dipstick: NEGATIVE
LEUKOCYTES UA: NEGATIVE
Nitrite: NEGATIVE
Specific Gravity, Urine: >=1.03 — AB (ref 1.000–1.030)
Total Protein, Urine: NEGATIVE
Urine Glucose: NEGATIVE
Urobilinogen, UA: 2 — AB (ref 0.0–1.0)
pH: 6 (ref 5.0–8.0)

## 2013-05-12 LAB — HEPATIC FUNCTION PANEL
ALK PHOS: 74 U/L (ref 39–117)
ALT: 19 U/L (ref 0–53)
AST: 18 U/L (ref 0–37)
Albumin: 3.9 g/dL (ref 3.5–5.2)
Bilirubin, Direct: 0.2 mg/dL (ref 0.0–0.3)
Total Bilirubin: 1 mg/dL (ref 0.3–1.2)
Total Protein: 6.5 g/dL (ref 6.0–8.3)

## 2013-05-12 LAB — LIPID PANEL
Cholesterol: 205 mg/dL — ABNORMAL HIGH (ref 0–200)
HDL: 45.4 mg/dL (ref >39.00)
Total CHOL/HDL Ratio: 5
Triglycerides: 92 mg/dL (ref 0.0–149.0)
VLDL: 18.4 mg/dL (ref 0.0–40.0)

## 2013-05-12 LAB — BASIC METABOLIC PANEL
BUN: 22 mg/dL (ref 6–23)
CO2: 29 mEq/L (ref 19–32)
Calcium: 9.3 mg/dL (ref 8.4–10.5)
Chloride: 106 mEq/L (ref 96–112)
Creatinine, Ser: 0.8 mg/dL (ref 0.4–1.5)
GFR: 116.28 mL/min (ref >60.00)
Glucose, Bld: 86 mg/dL (ref 70–99)
POTASSIUM: 4.7 meq/L (ref 3.5–5.1)
SODIUM: 141 meq/L (ref 135–145)

## 2013-05-12 LAB — MICROALBUMIN / CREATININE URINE RATIO
Creatinine,U: 181.2 mg/dL
MICROALB/CREAT RATIO: 0.3 mg/g (ref 0.0–30.0)
Microalb, Ur: 0.6 mg/dL (ref 0.0–1.9)

## 2013-05-12 LAB — PSA: PSA: 1.67 ng/mL (ref 0.10–4.00)

## 2013-05-12 LAB — LDL CHOLESTEROL, DIRECT: Direct LDL: 141.6 mg/dL

## 2013-05-12 LAB — HEMOGLOBIN A1C: Hgb A1c MFr Bld: 7.1 % — ABNORMAL HIGH (ref 4.6–6.5)

## 2013-05-12 LAB — TSH: TSH: 1.21 u[IU]/mL (ref 0.35–5.50)

## 2013-05-12 NOTE — Patient Instructions (Signed)
You had the new Prevnar pneumonia shot Please continue all other medications as before, and refills have been done if requested. Please have the pharmacy call with any other refills you may need.  Please go to the LAB in the Basement (turn left off the elevator) for the tests to be done today You will be contacted by phone if any changes need to be made immediately.  Otherwise, you will receive a letter about your results with an explanation, but please check with MyChart first.  Please remember to sign up for My Chart if you have not done so, as this will be important to you in the future with finding out test results, communicating by private email, and scheduling acute appointments online when needed.  Please return in 6 months, or sooner if needed

## 2013-05-12 NOTE — Progress Notes (Signed)
Pre-visit discussion using our clinic review tool. No additional management support is needed unless otherwise documented below in the visit note.  

## 2013-05-12 NOTE — Progress Notes (Signed)
Subjective:    Patient ID: Jacob Pittman, male    DOB: December 02, 1945, 68 y.o.   MRN: [id number]  HPI  Here to f/u; overall doing ok,  Pt denies chest pain, increased sob or doe, wheezing, orthopnea, PND, increased LE swelling, palpitations, dizziness or syncope.  Pt denies polydipsia, polyuria, or low sugar symptoms such as weakness or confusion improved with po intake.  Pt denies new neurological symptoms such as new headache, or facial or extremity weakness or numbness.   Pt states overall good compliance with meds, has been trying to follow lower cholesterol, diabetic diet, with wt overall stable,  but little exercise however.  Due for prevnar, no acute complaints Past Medical History  Diagnosis Date  . DIABETES MELLITUS, TYPE II 11/07/2008  . HYPERLIPIDEMIA 11/07/2008  . ANXIETY 02/04/2009  . HYPERTENSION 10/08/2008  . 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 OSTEOARTHRITIS, KNEE, RIGHT 10/08/2008  . HIP PAIN, RIGHT 11/06/2009  . Chronic systolic CHF (congestive heart failure)   . Nonischemic cardiomyopathy     s/p ICD implantation 03-2013 by Dr Ladona Ridgel   Past Surgical History  Procedure Laterality Date  . S/p left broken arm with pin      1980's  . Cardiac defibrillator placement Left 04/10/13    BTK single chamber ICD implanted by Dr Ladona Ridgel for primary prevention    reports that he has quit smoking. He has never used smokeless tobacco. He reports that he does not drink alcohol or use illicit drugs. family history includes Diabetes in his mother; Hypertension in his mother. No Known Allergies Current Outpatient Prescriptions on File Prior to Visit  Medication Sig Dispense Refill  . aspirin 81 MG EC tablet Take 81 mg by mouth daily.        . carvedilol (COREG) 12.5 MG tablet Take 1 tablet (12.5 mg total) by mouth 2 (two) times daily.  180 tablet  3  . furosemide (LASIX) 40 MG tablet Take 40 mg by mouth daily.       Marland Kitchen lisinopril (PRINIVIL,ZESTRIL) 20 MG tablet Take 1 tablet (20 mg  total) by mouth daily.  90 tablet  3  . pravastatin (PRAVACHOL) 40 MG tablet Take 1 tablet (40 mg total) by mouth daily.  90 tablet  3  . spironolactone (ALDACTONE) 25 MG tablet Take 1 tablet (25 mg total) by mouth daily.  90 tablet  3   No current facility-administered medications on file prior to visit.   Review of Systems  Constitutional: Negative for unexpected weight change, or unusual diaphoresis  HENT: Negative for tinnitus.   Eyes: Negative for photophobia and visual disturbance.  Respiratory: Negative for choking and stridor.   Gastrointestinal: Negative for vomiting and blood in stool.  Genitourinary: Negative for hematuria and decreased urine volume.  Musculoskeletal: Negative for acute joint swelling Skin: Negative for color change and wound.  Neurological: Negative for tremors and numbness other than noted  Psychiatric/Behavioral: Negative for decreased concentration or  hyperactivity.       Objective:   Physical Exam BP 120/70  Pulse 69  Temp(Src) 97.5 F (36.4 C) (Oral)  Ht 6\' 1"  (1.854 m)  Wt 215 lb 4 oz (97.637 kg)  BMI 28.41 kg/m2  SpO2 95% VS noted,  Constitutional: Pt appears well-developed and well-nourished.  HENT: Head: NCAT.  Right Ear: External ear normal.  Left Ear: External ear normal.  Eyes: Conjunctivae and EOM are normal. Pupils are equal, round, and reactive to light.  Neck: Normal range of motion. Neck supple.  Cardiovascular: Normal rate and regular rhythm.   Pulmonary/Chest: Effort normal and breath sounds normal.  Abd:  Soft, NT, non-distended, + BS Neurological: Pt is alert. Not confused  Skin: Skin is warm. No erythema.  Psychiatric: Pt behavior is normal. Thought content normal.     Assessment & Plan:

## 2013-05-13 NOTE — Assessment & Plan Note (Signed)
stable overall by history and exam, recent data reviewed with pt, and pt to continue medical treatment as before,  to f/u any worsening symptoms or concerns BP Readings from Last 3 Encounters:  05/12/13 120/70  04/11/13 111/63  04/11/13 111/63

## 2013-05-13 NOTE — Assessment & Plan Note (Signed)
stable overall by history and exam, recent data reviewed with pt, and pt to continue medical treatment as before,  to f/u any worsening symptoms or concerns Lab Results  Component Value Date   LDLCALC 93 11/04/2012    

## 2013-05-13 NOTE — Assessment & Plan Note (Signed)
stable overall by history and exam, recent data reviewed with pt, and pt to continue medical treatment as before,  to f/u any worsening symptoms or concerns Lab Results  Component Value Date   HGBA1C 7.1* 05/12/2013

## 2013-05-13 NOTE — Assessment & Plan Note (Signed)
Also for psa as he is due 

## 2013-05-16 ENCOUNTER — Encounter: Payer: Self-pay | Admitting: Internal Medicine

## 2013-05-16 ENCOUNTER — Other Ambulatory Visit: Payer: Self-pay | Admitting: Internal Medicine

## 2013-05-16 MED ORDER — PRAVASTATIN SODIUM 40 MG PO TABS: 40.0000 mg | ORAL_TABLET | Freq: Every day | ORAL | Status: DC

## 2013-05-19 ENCOUNTER — Ambulatory Visit: Payer: Medicare Other | Admitting: Cardiology

## 2013-07-10 ENCOUNTER — Encounter: Payer: Self-pay | Admitting: Cardiology

## 2013-07-10 ENCOUNTER — Ambulatory Visit (INDEPENDENT_AMBULATORY_CARE_PROVIDER_SITE_OTHER): Payer: Medicare Other | Admitting: Cardiology

## 2013-07-10 VITALS — BP 132/82 | HR 84 | Ht 73.0 in | Wt 220.0 lb

## 2013-07-10 DIAGNOSIS — I428 Other cardiomyopathies: Secondary | ICD-10-CM | POA: Diagnosis not present

## 2013-07-10 DIAGNOSIS — I5022 Chronic systolic (congestive) heart failure: Secondary | ICD-10-CM

## 2013-07-10 DIAGNOSIS — E119 Type 2 diabetes mellitus without complications: Secondary | ICD-10-CM | POA: Diagnosis not present

## 2013-07-10 NOTE — Progress Notes (Signed)
Linna DarnerJerry Ticas Date of Birth: Oct 15, 1945 Medical Record #[id number]  History of Present Illness: Mr. Bethann GooJefferson is seen for followup of his congestive heart failure. 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 EF of 20% despite optimal medical therapy. He is s/p ICD placement in December 2014. He reports he is feeling very well.  He remains abstinent from alcohol. He denies any SOB, chest pain, palpitations, or edema. Weight is down 12 lbs.  Current Outpatient Prescriptions on File Prior to Visit  Medication Sig Dispense Refill  . aspirin 81 MG EC tablet Take 81 mg by mouth daily.        . carvedilol (COREG) 12.5 MG tablet Take 1 tablet (12.5 mg total) by mouth 2 (two) times daily.  180 tablet  3  . furosemide (LASIX) 40 MG tablet Take 40 mg by mouth daily.       Marland Kitchen. lisinopril (PRINIVIL,ZESTRIL) 20 MG tablet Take 1 tablet (20 mg total) by mouth daily.  90 tablet  3  . pravastatin (PRAVACHOL) 40 MG tablet Take 1 tablet (40 mg total) by mouth daily.  90 tablet  3  . spironolactone (ALDACTONE) 25 MG tablet Take 1 tablet (25 mg total) by mouth daily.  90 tablet  3   No current facility-administered medications on file prior to visit.    No Known Allergies  Past Medical History  Diagnosis Date  . DIABETES MELLITUS, TYPE II 11/07/2008  . HYPERLIPIDEMIA 11/07/2008  . ANXIETY 02/04/2009  . HYPERTENSION 10/08/2008  . 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 OSTEOARTHRITIS, KNEE, RIGHT 10/08/2008  . HIP PAIN, RIGHT 11/06/2009  . Chronic systolic CHF (congestive heart failure)   . Nonischemic cardiomyopathy     s/p ICD implantation 03-2013 by Dr Ladona Ridgelaylor    Past Surgical History  Procedure Laterality Date  . S/p left broken arm with pin      1980's  . Cardiac defibrillator placement Left 04/10/13    BTK single chamber ICD implanted by Dr Ladona Ridgelaylor for primary prevention    History  Smoking status  . Former Smoker  Smokeless tobacco  .  Never Used    History  Alcohol Use No    Family History  Problem Relation Age of Onset  . Diabetes Mother   . Hypertension Mother     Review of Systems: As noted in history of present illness.  All other systems were reviewed and are negative.  Physical Exam: BP 132/82  Pulse 84  Ht 6\' 1"  (1.854 m)  Wt 220 lb (99.791 kg)  BMI 29.03 kg/m2 He is a very pleasant, obese black male in no acute distress. HEENT: Normal. Neck is supple without bruits, adenopathy, or thyromegaly. No JVD. Lungs: Clear Cardiovascular: Regular rate and rhythm. Normal S1 and S2. No gallop, murmur, or click. ICD site has healed well. Abdomen: Obese, soft, nontender. Bowel sounds are positive. No bladder splenomegaly. Extremities: No cyanosis. No edema. Pedal pulses are 2+. Skin: Warm and dry Neuro: Alert and oriented x3. Cranial nerves II through XII are intact. No focal findings.  LABORATORY DATA: Echo:Study Conclusions  - Left ventricle: The cavity size was severely dilated. Wall thickness was increased in a pattern of moderate LVH. The estimated ejection fraction was 20%. Diffuse hypokinesis. The study is not technically sufficient to allow evaluation of LV diastolic function. - Aortic valve: Trivial regurgitation. - Mitral valve: Mild regurgitation. - Left atrium: The atrium was moderately to severely dilated. - Right ventricle: The cavity size was  severely dilated. Systolic function was moderately reduced. - Right atrium: The atrium was moderately to severely dilated. - Pulmonary arteries: PA peak pressure: 32mm Hg (S). - Pericardium, extracardiac: A trivial pericardial effusion was identified posterior to the heart.    Assessment / Plan: 1. Chronic systolic congestive heart failure. Ejection fraction 20% despite optimal medical therapy. This is probably  related to chronic moonshine abuse. No significant ischemia or infarction noted on Myoview study. Patient is no longer drinking  alcohol. He is on appropriate therapy with lisinopril, carvedilol, Aldactone, and Lasix. Weight is down 12 lbs. We will continue sodium restriction 2 g daily. I will follow up in 6 months.  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 Hypertension-controlled  4. DM type 2.  5. S/p prophylactic ICD.

## 2013-07-10 NOTE — Patient Instructions (Signed)
Continue your current therapy  I will see you in 6 months.   

## 2013-07-12 ENCOUNTER — Ambulatory Visit (INDEPENDENT_AMBULATORY_CARE_PROVIDER_SITE_OTHER): Payer: Medicare Other | Admitting: Internal Medicine

## 2013-07-12 ENCOUNTER — Encounter: Payer: Self-pay | Admitting: Internal Medicine

## 2013-07-12 VITALS — BP 148/76 | HR 79 | Ht 73.0 in | Wt 224.0 lb

## 2013-07-12 DIAGNOSIS — I5023 Acute on chronic systolic (congestive) heart failure: Secondary | ICD-10-CM

## 2013-07-12 DIAGNOSIS — I509 Heart failure, unspecified: Secondary | ICD-10-CM | POA: Diagnosis not present

## 2013-07-12 DIAGNOSIS — Z95810 Presence of automatic (implantable) cardiac defibrillator: Secondary | ICD-10-CM | POA: Diagnosis not present

## 2013-07-12 LAB — MDC_IDC_ENUM_SESS_TYPE_INCLINIC
Brady Statistic RV Percent Paced: 0 %
Date Time Interrogation Session: 20150325093126
HIGH POWER IMPEDANCE MEASURED VALUE: 71 Ohm
Implantable Pulse Generator Model: 383594
Lead Channel Impedance Value: 478 Ohm
Lead Channel Pacing Threshold Amplitude: 0.5 V
Lead Channel Sensing Intrinsic Amplitude: 0.5 mV
Lead Channel Setting Pacing Amplitude: 2 V
Lead Channel Setting Pacing Pulse Width: 0.4 ms
MDC IDC MSMT LEADCHNL RV PACING THRESHOLD PULSEWIDTH: 0.4 ms
MDC IDC MSMT LEADCHNL RV SENSING INTR AMPL: 17.5 mV
MDC IDC PG SERIAL: 60733905
MDC IDC SET LEADCHNL RV SENSING SENSITIVITY: 0.8 mV

## 2013-07-12 NOTE — Assessment & Plan Note (Signed)
His Biotronik VDD ICD is working normally. Will recheck in several months. No atrial arrhythmias.

## 2013-07-12 NOTE — Assessment & Plan Note (Signed)
His symptoms are class 2 and well compensated. He will continue his current meds. He is maintaining a low sodium diet.

## 2013-07-12 NOTE — Progress Notes (Signed)
HPI Jacob Pittman returns today for followup. He is a pleasant 68 yo man with a h/o DM, HTN, chronic systolic heart failure, s/p ICD implant. In the interim, he has done well. He denies chest pain or sob. No syncope. No ICD shock. No peripheral edema. After implant the atrial portion of his ICD (VDD mode) was not sensing atrial activity but now is. No Known Allergies   Current Outpatient Prescriptions  Medication Sig Dispense Refill  . aspirin 81 MG EC tablet Take 81 mg by mouth daily.        . carvedilol (COREG) 12.5 MG tablet Take 1 tablet (12.5 mg total) by mouth 2 (two) times daily.  180 tablet  3  . furosemide (LASIX) 40 MG tablet Take 40 mg by mouth daily.       Marland Kitchen lisinopril (PRINIVIL,ZESTRIL) 20 MG tablet Take 1 tablet (20 mg total) by mouth daily.  90 tablet  3  . pravastatin (PRAVACHOL) 40 MG tablet Take 1 tablet (40 mg total) by mouth daily.  90 tablet  3  . spironolactone (ALDACTONE) 25 MG tablet Take 1 tablet (25 mg total) by mouth daily.  90 tablet  3   No current facility-administered medications for this visit.     Past Medical History  Diagnosis Date  . DIABETES MELLITUS, TYPE II 11/07/2008  . HYPERLIPIDEMIA 11/07/2008  . ANXIETY 02/04/2009  . HYPERTENSION 10/08/2008  . 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 OSTEOARTHRITIS, KNEE, RIGHT 10/08/2008  . HIP PAIN, RIGHT 11/06/2009  . Chronic systolic CHF (congestive heart failure)   . Nonischemic cardiomyopathy     s/p ICD implantation 03-2013 by Dr Ladona Ridgel    ROS:   All systems reviewed and negative except as noted in the HPI.   Past Surgical History  Procedure Laterality Date  . S/p left broken arm with pin      1980's  . Cardiac defibrillator placement Left 04/10/13    BTK single chamber ICD implanted by Dr Ladona Ridgel for primary prevention     Family History  Problem Relation Age of Onset  . Diabetes Mother   . Hypertension Mother      History   Social History  . Marital Status: Legally  Separated    Spouse Name: N/A    Number of Children: 2  . Years of Education: N/A   Occupational History  . retired [employer]    Social History Main Topics  . Smoking status: Former Games developer  . Smokeless tobacco: Never Used  . Alcohol Use: No  . Drug Use: No  . Sexual Activity: No   Other Topics Concern  . Not on file   Social History Narrative   Married/separated although wife comes to appts. With him   10th grade education - poor reading and writing ability   Incarcerated for 6 months in 2009.     BP 148/76  Pulse 79  Ht 6\' 1"  (1.854 m)  Wt 224 lb (101.606 kg)  BMI 29.56 kg/m2  Physical Exam:  Well appearing middle aged man, NAD HEENT: Unremarkable Neck:  No JVD, no thyromegally Back:  No CVA tenderness Lungs:  Clear with no wheezes HEART:  Regular rate rhythm, no murmurs, no rubs, no clicks Abd:  soft, positive bowel sounds, no organomegally, no rebound, no guarding Ext:  2 plus pulses, no edema, no cyanosis, no clubbing Skin:  No rashes no nodules Neuro:  CN II through XII intact, motor grossly intact   DEVICE  Normal  device function.  See PaceArt for details.   Assess/Plan:

## 2013-07-12 NOTE — Patient Instructions (Signed)
Your physician wants you to follow-up in: 9 months with Dr Taylor You will receive a reminder letter in the mail two months in advance. If you don't receive a letter, please call our office to schedule the follow-up appointment.    Remote monitoring is used to monitor your Pacemaker or ICD from home. This monitoring reduces the number of office visits required to check your device to one time per year. It allows us to keep an eye on the functioning of your device to ensure it is working properly. You are scheduled for a device check from home on 10/16/13. You may send your transmission at any time that day. If you have a wireless device, the transmission will be sent automatically. After your physician reviews your transmission, you will receive a postcard with your next transmission date.   

## 2013-07-18 ENCOUNTER — Encounter: Payer: Self-pay | Admitting: Internal Medicine

## 2013-09-22 ENCOUNTER — Encounter: Payer: Self-pay | Admitting: Internal Medicine

## 2013-09-22 ENCOUNTER — Ambulatory Visit (INDEPENDENT_AMBULATORY_CARE_PROVIDER_SITE_OTHER): Payer: Medicare Other | Admitting: Internal Medicine

## 2013-09-22 ENCOUNTER — Ambulatory Visit (INDEPENDENT_AMBULATORY_CARE_PROVIDER_SITE_OTHER)
Admission: RE | Admit: 2013-09-22 | Discharge: 2013-09-22 | Disposition: A | Discharge status: Home / Self Care | Payer: Medicare Other | Source: Ambulatory Visit | Attending: Internal Medicine | Admitting: Internal Medicine

## 2013-09-22 VITALS — BP 120/86 | HR 79 | Temp 98.0°F | Ht 73.0 in | Wt 236.1 lb

## 2013-09-22 DIAGNOSIS — R062 Wheezing: Secondary | ICD-10-CM

## 2013-09-22 DIAGNOSIS — J309 Allergic rhinitis, unspecified: Secondary | ICD-10-CM | POA: Diagnosis not present

## 2013-09-22 DIAGNOSIS — H109 Unspecified conjunctivitis: Secondary | ICD-10-CM | POA: Diagnosis not present

## 2013-09-22 DIAGNOSIS — R079 Chest pain, unspecified: Secondary | ICD-10-CM | POA: Diagnosis not present

## 2013-09-22 DIAGNOSIS — I5023 Acute on chronic systolic (congestive) heart failure: Secondary | ICD-10-CM

## 2013-09-22 DIAGNOSIS — I509 Heart failure, unspecified: Secondary | ICD-10-CM

## 2013-09-22 DIAGNOSIS — E785 Hyperlipidemia, unspecified: Secondary | ICD-10-CM

## 2013-09-22 MED ORDER — PREDNISONE 10 MG PO TABS: ORAL_TABLET | ORAL | Status: DC

## 2013-09-22 MED ORDER — FLUTICASONE PROPIONATE 50 MCG/ACT NA SUSP: 2.0000 | Freq: Every day | NASAL | Status: DC

## 2013-09-22 MED ORDER — LOVASTATIN 20 MG PO TABS: 20.0000 mg | ORAL_TABLET | Freq: Every day | ORAL | Status: DC

## 2013-09-22 MED ORDER — METHYLPREDNISOLONE ACETATE 80 MG/ML IJ SUSP
80.0000 mg | Freq: Once | INTRAMUSCULAR | Status: AC
  Administered 2013-09-22: 80 mg via INTRAMUSCULAR

## 2013-09-22 MED ORDER — CETIRIZINE HCL 10 MG PO TABS: 10.0000 mg | ORAL_TABLET | Freq: Every day | ORAL | Status: DC

## 2013-09-22 NOTE — Assessment & Plan Note (Signed)
C/w allergic, also for allegra prn, consider otc zaditor prn

## 2013-09-22 NOTE — Progress Notes (Signed)
Pre visit review using our clinic review tool, if applicable. No additional management support is needed unless otherwise documented below in the visit note. 

## 2013-09-22 NOTE — Progress Notes (Signed)
Subjective:    Patient ID: Jacob Pittman, male    DOB: Feb 07, 1946, 68 y.o.   MRN: [id number]  HPI  Here with 3 wks ongoing nasal and eye allergy symptoms with clearish congestion, itch and sneezing, without fever, pain, ST, cough, swelling or wheezing. No pain, except wheezing and mild sob began a few days ago, no prior hx of allergies and asthma.  Pt denies chest pain, increased sob or doe, wheezing, orthopnea, PND, increased LE swelling, palpitations, dizziness or syncope except for the above.   Pt denies polydipsia, polyuria,  Pravchol too expensive at 40 mg at [facility]. Past Medical History  Diagnosis Date  . DIABETES MELLITUS, TYPE II 11/07/2008  . HYPERLIPIDEMIA 11/07/2008  . ANXIETY 02/04/2009  . HYPERTENSION 10/08/2008  . 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 OSTEOARTHRITIS, KNEE, RIGHT 10/08/2008  . HIP PAIN, RIGHT 11/06/2009  . Chronic systolic CHF (congestive heart failure)   . Nonischemic cardiomyopathy     s/p ICD implantation 03-2013 by Dr Ladona Ridgel   Past Surgical History  Procedure Laterality Date  . S/p left broken arm with pin      1980's  . Cardiac defibrillator placement Left 04/10/13    BTK single chamber ICD implanted by Dr Ladona Ridgel for primary prevention    reports that he has quit smoking. He has never used smokeless tobacco. He reports that he does not drink alcohol or use illicit drugs. family history includes Diabetes in his mother; Hypertension in his mother. No Known Allergies Current Outpatient Prescriptions on File Prior to Visit  Medication Sig Dispense Refill  . aspirin 81 MG EC tablet Take 81 mg by mouth daily.        . carvedilol (COREG) 12.5 MG tablet Take 1 tablet (12.5 mg total) by mouth 2 (two) times daily.  180 tablet  3  . furosemide (LASIX) 40 MG tablet Take 40 mg by mouth daily.       Marland Kitchen lisinopril (PRINIVIL,ZESTRIL) 20 MG tablet Take 1 tablet (20 mg total) by mouth daily.  90 tablet  3  . spironolactone (ALDACTONE) 25 MG tablet Take 1 tablet (25  mg total) by mouth daily.  90 tablet  3   No current facility-administered medications on file prior to visit.    Review of Systems  Constitutional: Negative for unusual diaphoresis or other sweats  HENT: Negative for ringing in ear Eyes: Negative for double vision or worsening visual disturbance.  Respiratory: Negative for choking and stridor.   Gastrointestinal: Negative for vomiting or other signifcant bowel change Genitourinary: Negative for hematuria or decreased urine volume.  Musculoskeletal: Negative for other MSK pain or swelling Skin: Negative for color change and worsening wound.  Neurological: Negative for tremors and numbness other than noted  Psychiatric/Behavioral: Negative for decreased concentration or agitation other than above       Objective:   Physical Exam BP 120/86  Pulse 79  Temp(Src) 98 F (36.7 C) (Oral)  Ht 6\' 1"  (1.854 m)  Wt 236 lb 2 oz (107.106 kg)  BMI 31.16 kg/m2  SpO2 97% VS noted, not ill appearing Constitutional: Pt appears well-developed, well-nourished.  HENT: Head: NCAT.  Right Ear: External ear normal.  Left Ear: External ear normal.  Eyes: . Pupils are equal, round, and reactive to light. Conjunctivae with bilat clearish weepiness, puffy nontender right upper eyelid, and EOM are normal Neck: Normal range of motion. Neck supple.  Cardiovascular: Normal rate and regular rhythm.   Pulmonary/Chest: Effort normal and breath sounds mild decreased, few  wheeze bilat, ? Few bibas rales  Neurological: Pt is alert. Not confused , motor grossly intact Skin: Skin is warm. No rash Psychiatric: Pt behavior is normal. No agitation.     Assessment & Plan:

## 2013-09-22 NOTE — Patient Instructions (Signed)
You had the steroid shot today  Please take all new medication as prescribed - the prednisone, zyrtec, flonase, and lovastatin (for cholesterol)  OK to not take the pravachol as before  You are also given the advair 100/50 sample to use 1 puff twice per day until gone (about 2 wks)  Please continue all other medications as before, and refills have been done if requested.  Please have the pharmacy call with any other refills you may need.  Please continue your efforts at being more active, low cholesterol diet, and weight control.  Please keep your appointments with your specialists as you may have planned  Please go to the XRAY Department in the Basement (go straight as you get off the elevator) for the x-ray testing  You will be contacted by phone if any changes need to be made immediately.  Otherwise, you will receive a letter about your results with an explanation, but please check with MyChart first.

## 2013-09-22 NOTE — Assessment & Plan Note (Signed)
Also for cxr to help assess volume, cont same tx for now

## 2013-09-22 NOTE — Assessment & Plan Note (Signed)
Ok to change the pravachol to lovastatin 20 qd due to cost concerns (did not take the pravachol) o/w stable overall by history and exam, recent data reviewed with pt, and pt to continue medical treatment as before,  to f/u any worsening symptoms or concerns Lab Results  Component Value Date   LDLCALC 93 11/04/2012

## 2013-09-22 NOTE — Assessment & Plan Note (Signed)
New onset this season it seems, for flonase asd,  to f/u any worsening symptoms or concerns

## 2013-09-22 NOTE — Assessment & Plan Note (Addendum)
Suspect asthma like allergy related, gave advair 100/50 to use bid x 2 wks, also depomedrol IM, predpac asd

## 2013-10-09 ENCOUNTER — Encounter: Payer: Self-pay | Admitting: Podiatry

## 2013-10-09 ENCOUNTER — Ambulatory Visit (INDEPENDENT_AMBULATORY_CARE_PROVIDER_SITE_OTHER): Payer: Medicare Other | Admitting: Podiatry

## 2013-10-09 VITALS — BP 142/86 | HR 86 | Resp 12

## 2013-10-09 DIAGNOSIS — M79609 Pain in unspecified limb: Secondary | ICD-10-CM

## 2013-10-09 DIAGNOSIS — R0989 Other specified symptoms and signs involving the circulatory and respiratory systems: Secondary | ICD-10-CM

## 2013-10-09 DIAGNOSIS — L84 Corns and callosities: Secondary | ICD-10-CM

## 2013-10-09 DIAGNOSIS — M79673 Pain in unspecified foot: Secondary | ICD-10-CM

## 2013-10-09 DIAGNOSIS — B351 Tinea unguium: Secondary | ICD-10-CM | POA: Diagnosis not present

## 2013-10-09 NOTE — Progress Notes (Signed)
   Subjective:    Patient ID: Jacob Pittman, male    DOB: 24-May-1945, 68 y.o.   MRN: [id number]  HPI  PT STATED B/L TOENAILS ARE THICK AND HAVE DISCOLORATION, ESPECIALLY 2ND TOE HAVE NAIL GROWING ON THE SIDE FOR ABOUT 2 YEARS. THE TOENAILS ARE GETTING WORSE AND THICKER. THE AGGRAVATED BY WEARING SOCKS. TRIED TO SOAKING WITH CLOROX ONCE A WEEK AND IT HELPS SOME.    Review of Systems  HENT: Positive for sinus pressure.   Respiratory: Positive for cough, chest tightness, shortness of breath and wheezing.   Musculoskeletal: Positive for joint swelling.  All other systems reviewed and are negative.      Objective:   Physical Exam  Black male orientated x3  Vascular: DP pulses 2/4 bilaterally PT right 0/4 PT left 1/4  Neurological: Sensation to 10 g monofilament wire intact 5/5 right and 3/5 left Ankle reflexes equal and reactive bilaterally Vibratory sensation intact bilaterally  Dermatological: Hypertrophic, elongated, discolored toenails x10  A cutaneous horn second right toe located at the lateral proximal interphalangeal joint  Musculoskeletal: HAV deformities bilaterally Limited subtalar joint motion bilaterally       Assessment & Plan:   Assessment: Diminished pedal pulses rule out peripheral arterial disease Symptomatic onychomycoses x10 Cutaneous horn x1  Plan: Nails x10 and keratoses x1 debrided without a bleeding  Patient referred to vascular lab for a lower extremity arterial Doppler for the indication of diminished pedal pulses. Notify patient on receipt of arterial Doppler  Reappoint x3 months for debridement of skin and nails

## 2013-10-09 NOTE — Patient Instructions (Signed)
The vascular lab will contact you to schedule an appointment for lower extremity arterial Doppler. Our office will notify you of the results  Diabetes and Foot Care Diabetes may cause you to have problems because of poor blood supply (circulation) to your feet and legs. This may cause the skin on your feet to become thinner, break easier, and heal more slowly. Your skin may become dry, and the skin may peel and crack. You may also have nerve damage in your legs and feet causing decreased feeling in them. You may not notice minor injuries to your feet that could lead to infections or more serious problems. Taking care of your feet is one of the most important things you can do for yourself.  HOME CARE INSTRUCTIONS  Wear shoes at all times, even in the house. Do not go barefoot. Bare feet are easily injured.  Check your feet daily for blisters, cuts, and redness. If you cannot see the bottom of your feet, use a mirror or ask someone for help.  Wash your feet with warm water (do not use hot water) and mild soap. Then pat your feet and the areas between your toes until they are completely dry. Do not soak your feet as this can dry your skin.  Apply a moisturizing lotion or petroleum jelly (that does not contain alcohol and is unscented) to the skin on your feet and to dry, brittle toenails. Do not apply lotion between your toes.  Trim your toenails straight across. Do not dig under them or around the cuticle. File the edges of your nails with an emery board or nail file.  Do not cut corns or calluses or try to remove them with medicine.  Wear clean socks or stockings every day. Make sure they are not too tight. Do not wear knee-high stockings since they may decrease blood flow to your legs.  Wear shoes that fit properly and have enough cushioning. To break in new shoes, wear them for just a few hours a day. This prevents you from injuring your feet. Always look in your shoes before you put them on to  be sure there are no objects inside.  Do not cross your legs. This may decrease the blood flow to your feet.  If you find a minor scrape, cut, or break in the skin on your feet, keep it and the skin around it clean and dry. These areas may be cleansed with mild soap and water. Do not cleanse the area with peroxide, alcohol, or iodine.  When you remove an adhesive bandage, be sure not to damage the skin around it.  If you have a wound, look at it several times a day to make sure it is healing.  Do not use heating pads or hot water bottles. They may burn your skin. If you have lost feeling in your feet or legs, you may not know it is happening until it is too late.  Make sure your health care provider performs a complete foot exam at least annually or more often if you have foot problems. Report any cuts, sores, or bruises to your health care provider immediately. SEEK MEDICAL CARE IF:   You have an injury that is not healing.  You have cuts or breaks in the skin.  You have an ingrown nail.  You notice redness on your legs or feet.  You feel burning or tingling in your legs or feet.  You have pain or cramps in your legs and feet.  Your legs or feet are numb.  Your feet always feel cold. SEEK IMMEDIATE MEDICAL CARE IF:   There is increasing redness, swelling, or pain in or around a wound.  There is a red line that goes up your leg.  Pus is coming from a wound.  You develop a fever or as directed by your health care provider.  You notice a bad smell coming from an ulcer or wound. Document Released: 04/03/2000 Document Revised: 12/07/2012 Document Reviewed: 09/13/2012 [facility] Patient Information 2015 [place], [place]. This information is not intended to replace advice given to you by your health care provider. Make sure you discuss any questions you have with your health care provider.

## 2013-10-10 ENCOUNTER — Telehealth (HOSPITAL_COMMUNITY): Payer: Self-pay | Admitting: *Deleted

## 2013-10-10 ENCOUNTER — Encounter: Payer: Self-pay | Admitting: Podiatry

## 2013-10-11 ENCOUNTER — Telehealth (HOSPITAL_COMMUNITY): Payer: Self-pay | Admitting: *Deleted

## 2013-10-16 ENCOUNTER — Telehealth: Payer: Self-pay | Admitting: Cardiology

## 2013-10-16 ENCOUNTER — Ambulatory Visit (INDEPENDENT_AMBULATORY_CARE_PROVIDER_SITE_OTHER): Payer: Medicare Other | Admitting: *Deleted

## 2013-10-16 DIAGNOSIS — I428 Other cardiomyopathies: Secondary | ICD-10-CM | POA: Diagnosis not present

## 2013-10-16 LAB — MDC_IDC_ENUM_SESS_TYPE_REMOTE
Brady Statistic RV Percent Paced: 2 %
HighPow Impedance: 74 Ohm
Implantable Pulse Generator Model: 383594
Implantable Pulse Generator Serial Number: 60733905
Lead Channel Impedance Value: 493 Ohm
Lead Channel Pacing Threshold Amplitude: 0.5 V
Lead Channel Pacing Threshold Pulse Width: 0.4 ms
Lead Channel Sensing Intrinsic Amplitude: 0.5 mV — CL
Lead Channel Sensing Intrinsic Amplitude: 16.3 mV
Lead Channel Setting Sensing Sensitivity: 0.8 mV
MDC IDC MSMT BATTERY VOLTAGE: 3.12 V
MDC IDC SET LEADCHNL RV PACING AMPLITUDE: 2 V
MDC IDC SET LEADCHNL RV PACING PULSEWIDTH: 0.4 ms

## 2013-10-16 NOTE — Progress Notes (Signed)
Remote ICD transmission.   

## 2013-10-16 NOTE — Telephone Encounter (Signed)
Spoke with pt and reminded pt of remote transmission that is due today. Pt verbalized understanding.   

## 2013-10-17 ENCOUNTER — Ambulatory Visit (HOSPITAL_COMMUNITY)
Admission: RE | Admit: 2013-10-17 | Discharge: 2013-10-17 | Disposition: A | Discharge status: Home / Self Care | Payer: Medicare Other | Source: Ambulatory Visit | Attending: Cardiovascular Disease | Admitting: Cardiovascular Disease

## 2013-10-17 DIAGNOSIS — R0989 Other specified symptoms and signs involving the circulatory and respiratory systems: Secondary | ICD-10-CM | POA: Diagnosis not present

## 2013-10-17 NOTE — Progress Notes (Signed)
Lower Extremity Arterial Duplex Completed. °Brianna L Mazza,RVT °

## 2013-10-19 ENCOUNTER — Telehealth: Payer: Self-pay | Admitting: *Deleted

## 2013-10-19 ENCOUNTER — Telehealth (HOSPITAL_COMMUNITY): Payer: Self-pay | Admitting: *Deleted

## 2013-10-19 DIAGNOSIS — I739 Peripheral vascular disease, unspecified: Secondary | ICD-10-CM

## 2013-10-19 NOTE — Telephone Encounter (Signed)
I called and informed the patient's daughter, Vikki Ports, that his doppler study came back abnormal.  Dr. Leeanne Deed wants him to have a consultation with Dr. Nanetta Batty.  I told her it's the same location where he had the doppler done.  I told her they will call to get it scheduled.  I asked if they should call her.  She stated yes.  She asked what does it mean when you say the test was abnormal.  I told her he has peripheral vascular disease, clogged arteries.  She stated okay, I will let him know.

## 2013-10-19 NOTE — Telephone Encounter (Signed)
Message copied by Enedina Finner on Thu Oct 19, 2013  2:20 PM ------      Message from: Carrington Clamp      Created: Thu Oct 19, 2013  1:41 PM       The results of the lower extremity arterial Doppler dated 10/18/2013 are abnormal            Contact patient advise followup with Dr. Nanetta Batty ------

## 2013-10-25 ENCOUNTER — Telehealth: Payer: Self-pay | Admitting: Cardiovascular Disease

## 2013-10-25 ENCOUNTER — Encounter: Payer: Self-pay | Admitting: Cardiology

## 2013-10-26 NOTE — Telephone Encounter (Signed)
Closed encounter °

## 2013-10-31 ENCOUNTER — Encounter: Payer: Self-pay | Admitting: Cardiovascular Disease

## 2013-10-31 ENCOUNTER — Encounter: Payer: Self-pay | Admitting: Internal Medicine

## 2013-10-31 ENCOUNTER — Ambulatory Visit (INDEPENDENT_AMBULATORY_CARE_PROVIDER_SITE_OTHER): Payer: Medicare Other | Admitting: Cardiovascular Disease

## 2013-10-31 VITALS — BP 144/91 | HR 84 | Ht 73.0 in | Wt 232.0 lb

## 2013-10-31 DIAGNOSIS — I739 Peripheral vascular disease, unspecified: Secondary | ICD-10-CM

## 2013-10-31 NOTE — Patient Instructions (Signed)
Your physician recommends that you schedule a follow-up appointment as needed with Dr. Berry   

## 2013-10-31 NOTE — Assessment & Plan Note (Signed)
Patient is a 68 year old moderately overweight married [place] American male referred by Dr. Leeanne Deed, [facility] , for evaluation of peripheral arterial disease. He apparently unable to palpate pedal pulses during a routine exam. He was referred for Doppler studies which revealed two-vessel disease. The patient has no open wounds or ulcers I really denies having claudication. At this point I for a conservative approach. I told him the only professionals such Dr. Leeanne Deed should be allowed to perform any procedures on his foot.,I  Will see him back when necessary.

## 2013-10-31 NOTE — Progress Notes (Signed)
10/31/2013 Jacob Pittman   1945-09-03  [id number]  Primary Physician Oliver BarreJames John, MD Primary Cardiologist: Runell GessJonathan J. Karl Knarr MD Roseanne RenoFACP,FACC,FAHA, FSCAI   HPI:  Jacob Pittman is a 68 year old moderately overweight married African American male father of 2 children (accompanied by one of his daughters), and father of 5 grandchildren referred by Dr. Leeanne Deeduchman for peripheral arterial evaluation. His primary cardiologist is Dr. Peter SwazilandJordan. 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 His ejection fraction estimate 20% range. He had an ICD placed by Dr. Ladona Ridgelaylor back in December. He denies chest pain or shortness of breath. Is a problems include hypertension and hyperlipidemia. He denies claudication. Dr. Leeanne Deeduchman was unable to palpate pedal pulses during routine exam and referred him for arterial Doppler studies which revealed significant tibial vessel disease. The patient has no evidence of active ulcers were critical limb ischemia at this time.   Current Outpatient Prescriptions  Medication Sig Dispense Refill  . aspirin 81 MG EC tablet Take 81 mg by mouth daily.        . carvedilol (COREG) 12.5 MG tablet Take 1 tablet (12.5 mg total) by mouth 2 (two) times daily.  180 tablet  3  . cetirizine (ZYRTEC) 10 MG tablet Take 1 tablet (10 mg total) by mouth daily.  30 tablet  11  . fluticasone (FLONASE) 50 MCG/ACT nasal spray Place 2 sprays into both nostrils daily.  16 g  2  . furosemide (LASIX) 40 MG tablet Take 40 mg by mouth daily.       Marland Kitchen. lisinopril (PRINIVIL,ZESTRIL) 20 MG tablet Take 1 tablet (20 mg total) by mouth daily.  90 tablet  3  . lovastatin (MEVACOR) 20 MG tablet Take 1 tablet (20 mg total) by mouth at bedtime.  90 tablet  3  . predniSONE (DELTASONE) 10 MG tablet 3 tabs by mouth per day for 3 days,2tabs per day for 3 days,1tab per day for 3 days  18 tablet  0  . spironolactone (ALDACTONE) 25 MG tablet Take 1 tablet (25 mg total) by mouth daily.  90  tablet  3   No current facility-administered medications for this visit.    No Known Allergies  History   Social History  . Marital Status: Legally Separated    Spouse Name: N/A    Number of Children: 2  . Years of Education: N/A   Occupational History  . retired [employer]    Social History Main Topics  . Smoking status: Former Games developermoker  . Smokeless tobacco: Never Used  . Alcohol Use: No  . Drug Use: No  . Sexual Activity: No   Other Topics Concern  . Not on file   Social History Narrative   Married/separated although wife comes to appts. With him   10th grade education - poor reading and writing ability   Incarcerated for 6 months in 2009.     Review of Systems: General: negative for chills, fever, night sweats or weight changes.  Cardiovascular: negative for chest pain, dyspnea on exertion, edema, orthopnea, palpitations, paroxysmal nocturnal dyspnea or shortness of breath Dermatological: negative for rash Respiratory: negative for cough or wheezing Urologic: negative for hematuria Abdominal: negative for nausea, vomiting, diarrhea, bright red blood per rectum, melena, or hematemesis Neurologic: negative for visual changes, syncope, or dizziness All other systems reviewed and are otherwise negative except as noted above.    Blood pressure 144/91, pulse 84, height 6\' 1"  (1.854 m), weight 232 lb (105.235  kg).  General appearance: alert and no distress Neck: no adenopathy, no carotid bruit, no JVD, supple, symmetrical, trachea midline and thyroid not enlarged, symmetric, no tenderness/mass/nodules Lungs: clear to auscultation bilaterally Heart: regular rate and rhythm, S1, S2 normal, no murmur, click, rub or gallop Extremities: venous stasis changes, 2+ right dorsalis pedis, left pedal pulses  EKG not performed today  ASSESSMENT AND PLAN:   Peripheral arterial disease Patient is a 67 year old moderately overweight married [place] American male referred by Dr.  Leeanne Deed, [facility] , for evaluation of peripheral arterial disease. He apparently unable to palpate pedal pulses during a routine exam. He was referred for Doppler studies which revealed two-vessel disease. The patient has no open wounds or ulcers I really denies having claudication. At this point I for a conservative approach. I told him the only professionals such Dr. Leeanne Deed should be allowed to perform any procedures on his foot.,I  Will see him back when necessary.      Runell Gess MD FACP,FACC,FAHA, [facility] 10/31/2013 8:57 AM

## 2013-11-01 ENCOUNTER — Encounter: Payer: Self-pay | Admitting: Gastroenterology

## 2013-11-10 ENCOUNTER — Ambulatory Visit (INDEPENDENT_AMBULATORY_CARE_PROVIDER_SITE_OTHER): Payer: Medicare Other | Admitting: Internal Medicine

## 2013-11-10 ENCOUNTER — Encounter: Payer: Self-pay | Admitting: Internal Medicine

## 2013-11-10 ENCOUNTER — Other Ambulatory Visit (INDEPENDENT_AMBULATORY_CARE_PROVIDER_SITE_OTHER): Payer: Medicare Other

## 2013-11-10 VITALS — BP 138/80 | HR 61 | Temp 97.8°F | Ht 73.0 in | Wt 236.5 lb

## 2013-11-10 DIAGNOSIS — E785 Hyperlipidemia, unspecified: Secondary | ICD-10-CM

## 2013-11-10 DIAGNOSIS — E119 Type 2 diabetes mellitus without complications: Secondary | ICD-10-CM | POA: Diagnosis not present

## 2013-11-10 DIAGNOSIS — I10 Essential (primary) hypertension: Secondary | ICD-10-CM

## 2013-11-10 DIAGNOSIS — IMO0001 Reserved for inherently not codable concepts without codable children: Secondary | ICD-10-CM | POA: Diagnosis not present

## 2013-11-10 DIAGNOSIS — E1165 Type 2 diabetes mellitus with hyperglycemia: Principal | ICD-10-CM

## 2013-11-10 LAB — BASIC METABOLIC PANEL
BUN: 19 mg/dL (ref 6–23)
CALCIUM: 8.9 mg/dL (ref 8.4–10.5)
CHLORIDE: 104 meq/L (ref 96–112)
CO2: 29 mEq/L (ref 19–32)
CREATININE: 0.8 mg/dL (ref 0.4–1.5)
GFR: 126.48 mL/min (ref >60.00)
Glucose, Bld: 155 mg/dL — ABNORMAL HIGH (ref 70–99)
Potassium: 4.7 mEq/L (ref 3.5–5.1)
Sodium: 137 mEq/L (ref 135–145)

## 2013-11-10 LAB — HEPATIC FUNCTION PANEL
ALT: 26 U/L (ref 0–53)
AST: 26 U/L (ref 0–37)
Albumin: 3.9 g/dL (ref 3.5–5.2)
Alkaline Phosphatase: 67 U/L (ref 39–117)
BILIRUBIN DIRECT: 0.1 mg/dL (ref 0.0–0.3)
TOTAL PROTEIN: 6.6 g/dL (ref 6.0–8.3)
Total Bilirubin: 1.1 mg/dL (ref 0.2–1.2)

## 2013-11-10 LAB — LIPID PANEL
Cholesterol: 159 mg/dL (ref 0–200)
HDL: 41.4 mg/dL (ref >39.00)
LDL Cholesterol: 104 mg/dL — ABNORMAL HIGH (ref 0–99)
NONHDL: 117.6
TRIGLYCERIDES: 67 mg/dL (ref 0.0–149.0)
Total CHOL/HDL Ratio: 4
VLDL: 13.4 mg/dL (ref 0.0–40.0)

## 2013-11-10 LAB — HEMOGLOBIN A1C: Hgb A1c MFr Bld: 8.4 % — ABNORMAL HIGH (ref 4.6–6.5)

## 2013-11-10 NOTE — Assessment & Plan Note (Signed)
stable overall by history and exam, recent data reviewed with pt, and pt to continue medical treatment as before,  to f/u any worsening symptoms or concerns Lab Results  Component Value Date   HGBA1C 7.1* 05/12/2013    

## 2013-11-10 NOTE — Patient Instructions (Signed)

## 2013-11-10 NOTE — Assessment & Plan Note (Signed)
stable overall by history and exam, recent data reviewed with pt, and pt to continue medical treatment as before,  to f/u any worsening symptoms or concerns Lab Results  Component Value Date   LDLCALC 93 11/04/2012

## 2013-11-10 NOTE — Progress Notes (Signed)
Pre visit review using our clinic review tool, if applicable. No additional management support is needed unless otherwise documented below in the visit note. 

## 2013-11-10 NOTE — Progress Notes (Signed)
Subjective:    Patient ID: Jacob Pittman, male    DOB: 1945-05-20, 68 y.o.   MRN: [id number]  HPI  Here to f/u; overall doing ok,  Pt denies chest pain, increased sob or doe, wheezing, orthopnea, PND, increased LE swelling, palpitations, dizziness or syncope.  Pt denies polydipsia, polyuria, or low sugar symptoms such as weakness or confusion improved with po intake.  Pt denies new neurological symptoms such as new headache, or facial or extremity weakness or numbness.   Pt states overall good compliance with meds, has been trying to follow lower cholesterol, diabetic diet, with wt overall stable,  but little exercise however.  No current complaints. June cxr neg for acute.  Oct 2014 echo with EF 20%.  Tolerating new statin from last visit jan 2015.  No current leg claudiation symptoms Past Medical History  Diagnosis Date  . DIABETES MELLITUS, TYPE II 11/07/2008  . HYPERLIPIDEMIA 11/07/2008  . ANXIETY 02/04/2009  . HYPERTENSION 10/08/2008  . 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 OSTEOARTHRITIS, KNEE, RIGHT 10/08/2008  . HIP PAIN, RIGHT 11/06/2009  . Chronic systolic CHF (congestive heart failure)   . Nonischemic cardiomyopathy     s/p ICD implantation 03-2013 by Dr Ladona Ridgel  . Peripheral arterial disease    Past Surgical History  Procedure Laterality Date  . S/p left broken arm with pin      1980's  . Cardiac defibrillator placement Left 04/10/13    BTK single chamber ICD implanted by Dr Ladona Ridgel for primary prevention    reports that he has quit smoking. He has never used smokeless tobacco. He reports that he does not drink alcohol or use illicit drugs. family history includes Diabetes in his mother; Hypertension in his mother. No Known Allergies Current Outpatient Prescriptions on File Prior to Visit  Medication Sig Dispense Refill  . aspirin 81 MG EC tablet Take 81 mg by mouth daily.        . carvedilol (COREG) 12.5 MG tablet Take 1 tablet (12.5 mg total) by mouth 2 (two) times  daily.  180 tablet  3  . cetirizine (ZYRTEC) 10 MG tablet Take 1 tablet (10 mg total) by mouth daily.  30 tablet  11  . fluticasone (FLONASE) 50 MCG/ACT nasal spray Place 2 sprays into both nostrils daily.  16 g  2  . furosemide (LASIX) 40 MG tablet Take 40 mg by mouth daily.       Marland Kitchen lisinopril (PRINIVIL,ZESTRIL) 20 MG tablet Take 1 tablet (20 mg total) by mouth daily.  90 tablet  3  . lovastatin (MEVACOR) 20 MG tablet Take 1 tablet (20 mg total) by mouth at bedtime.  90 tablet  3  . spironolactone (ALDACTONE) 25 MG tablet Take 1 tablet (25 mg total) by mouth daily.  90 tablet  3   No current facility-administered medications on file prior to visit.   Review of Systems  Constitutional: Negative for unusual diaphoresis or other sweats  HENT: Negative for ringing in ear Eyes: Negative for double vision or worsening visual disturbance.  Respiratory: Negative for choking and stridor.   Gastrointestinal: Negative for vomiting or other signifcant bowel change Genitourinary: Negative for hematuria or decreased urine volume.  Musculoskeletal: Negative for other MSK pain or swelling Skin: Negative for color change and worsening wound.  Neurological: Negative for tremors and numbness other than noted  Psychiatric/Behavioral: Negative for decreased concentration or agitation other than above       Objective:   Physical Exam BP 138/80  Pulse  61  Temp(Src) 97.8 F (36.6 C) (Oral)  Ht 6\' 1"  (1.854 m)  Wt 236 lb 8 oz (107.276 kg)  BMI 31.21 kg/m2  SpO2 96% VS noted,  Constitutional: Pt appears well-developed, well-nourished.  HENT: Head: NCAT.  Right Ear: External ear normal.  Left Ear: External ear normal.  Eyes: . Pupils are equal, round, and reactive to light. Conjunctivae and EOM are normal Neck: Normal range of motion. Neck supple.  Cardiovascular: Normal rate and regular rhythm.   Pulmonary/Chest: Effort normal and breath sounds normal.  Abd:  Soft, NT, ND, + BS Neurological: Pt is  alert. Not confused , motor grossly intact Skin: Skin is warm. No rash Psychiatric: Pt behavior is normal. No agitation.     Assessment & Plan:

## 2013-11-10 NOTE — Assessment & Plan Note (Signed)
stable overall by history and exam, recent data reviewed with pt, and pt to continue medical treatment as before,  to f/u any worsening symptoms or concerns BP Readings from Last 3 Encounters:  11/10/13 138/80  10/31/13 144/91  10/09/13 142/86

## 2013-11-14 ENCOUNTER — Other Ambulatory Visit: Payer: Self-pay | Admitting: Internal Medicine

## 2013-11-14 ENCOUNTER — Encounter: Payer: Self-pay | Admitting: Internal Medicine

## 2013-11-14 MED ORDER — METFORMIN HCL ER 500 MG PO TB24: ORAL_TABLET | ORAL | Status: DC

## 2013-12-29 ENCOUNTER — Other Ambulatory Visit: Payer: Self-pay | Admitting: Cardiology

## 2014-01-15 ENCOUNTER — Ambulatory Visit (INDEPENDENT_AMBULATORY_CARE_PROVIDER_SITE_OTHER): Payer: Medicare Other | Admitting: *Deleted

## 2014-01-15 ENCOUNTER — Encounter: Payer: Self-pay | Admitting: Podiatry

## 2014-01-15 ENCOUNTER — Ambulatory Visit (INDEPENDENT_AMBULATORY_CARE_PROVIDER_SITE_OTHER): Payer: Medicare Other | Admitting: Podiatry

## 2014-01-15 VITALS — BP 174/85 | HR 76 | Resp 18

## 2014-01-15 DIAGNOSIS — E1159 Type 2 diabetes mellitus with other circulatory complications: Secondary | ICD-10-CM | POA: Diagnosis not present

## 2014-01-15 DIAGNOSIS — B351 Tinea unguium: Secondary | ICD-10-CM

## 2014-01-15 DIAGNOSIS — M79609 Pain in unspecified limb: Secondary | ICD-10-CM | POA: Diagnosis not present

## 2014-01-15 DIAGNOSIS — L84 Corns and callosities: Secondary | ICD-10-CM | POA: Diagnosis not present

## 2014-01-15 DIAGNOSIS — I428 Other cardiomyopathies: Secondary | ICD-10-CM

## 2014-01-15 DIAGNOSIS — M79676 Pain in unspecified toe(s): Secondary | ICD-10-CM

## 2014-01-15 LAB — MDC_IDC_ENUM_SESS_TYPE_REMOTE
Battery Voltage: 3.12 V
Brady Statistic RV Percent Paced: 2 %
HighPow Impedance: 72 Ohm
Implantable Pulse Generator Model: 383594
Implantable Pulse Generator Serial Number: 60733905
Lead Channel Impedance Value: 435 Ohm
Lead Channel Pacing Threshold Amplitude: 0.5 V
Lead Channel Pacing Threshold Pulse Width: 0.4 ms
Lead Channel Sensing Intrinsic Amplitude: 1.3 mV
Lead Channel Sensing Intrinsic Amplitude: 14.5 mV
Lead Channel Setting Pacing Amplitude: 2 V
Lead Channel Setting Pacing Pulse Width: 0.4 ms
Lead Channel Setting Sensing Sensitivity: 0.8 mV

## 2014-01-15 NOTE — Progress Notes (Signed)
Remote ICD transmission.   

## 2014-01-16 NOTE — Progress Notes (Signed)
Patient ID: Jacob Pittman, male   DOB: 03-24-1946, 68 y.o.   MRN: [id number]  Subjective: This patient presents complaining of a painful keratoses on the second right toe and painful toenails  Objective: The toenails are elongated, hypertrophic, discolored 6-10 Large keratoses medial border second right toe  Assessment: Symptomatic onychomycoses 6-10 Keratoses x1 Peripheral arterial disease evaluated by Dr. Nanetta Batty 10/31/2013  Plan: Nails x10 debrided without a bleeding Keratoses debrided  Reappoint x90 days

## 2014-01-18 ENCOUNTER — Encounter: Payer: Self-pay | Admitting: Gastroenterology

## 2014-01-22 ENCOUNTER — Encounter: Payer: Self-pay | Admitting: Cardiology

## 2014-01-25 ENCOUNTER — Encounter: Payer: Self-pay | Admitting: Internal Medicine

## 2014-02-16 ENCOUNTER — Telehealth: Payer: Self-pay | Admitting: Internal Medicine

## 2014-02-16 NOTE — Telephone Encounter (Signed)
Walk In pt Form " [facility]" gave to [facility]  10.30.15/km

## 2014-03-21 ENCOUNTER — Encounter: Payer: Self-pay | Admitting: Internal Medicine

## 2014-03-21 ENCOUNTER — Ambulatory Visit (INDEPENDENT_AMBULATORY_CARE_PROVIDER_SITE_OTHER): Payer: Medicare Other | Admitting: Internal Medicine

## 2014-03-21 VITALS — BP 132/80 | HR 90 | Temp 98.9°F | Ht 73.0 in | Wt 242.1 lb

## 2014-03-21 DIAGNOSIS — Z0189 Encounter for other specified special examinations: Secondary | ICD-10-CM

## 2014-03-21 DIAGNOSIS — E119 Type 2 diabetes mellitus without complications: Secondary | ICD-10-CM | POA: Diagnosis not present

## 2014-03-21 DIAGNOSIS — I10 Essential (primary) hypertension: Secondary | ICD-10-CM | POA: Diagnosis not present

## 2014-03-21 DIAGNOSIS — I5023 Acute on chronic systolic (congestive) heart failure: Secondary | ICD-10-CM | POA: Diagnosis not present

## 2014-03-21 DIAGNOSIS — Z0000 Encounter for general adult medical examination without abnormal findings: Secondary | ICD-10-CM

## 2014-03-21 MED ORDER — FUROSEMIDE 40 MG PO TABS: 40.0000 mg | ORAL_TABLET | Freq: Two times a day (BID) | ORAL | Status: DC

## 2014-03-21 NOTE — Assessment & Plan Note (Signed)
mildm exam c/w volume overload, likely related to not taking his prescribed lasix 40 qd; for today to re-start lasix 40 bid, cont all other meds, f/u 1 wk for exam and lab f/u, will need to help arrange f/u with Dr Aurora Mask who I believe is his primary cardiologist

## 2014-03-21 NOTE — Assessment & Plan Note (Signed)
stable overall by history and exam, recent data reviewed with pt, and pt to continue medical treatment as before,  to f/u any worsening symptoms or concerns BP Readings from Last 3 Encounters:  03/21/14 132/80  01/15/14 174/85  11/10/13 138/80

## 2014-03-21 NOTE — Patient Instructions (Addendum)
Ok to re-start the lasix at 40 mg twice per day  Please check your weight every day in the AM, and bring them with you to your next appointment  Please continue all other medications as before, and refills have been done if requested.  Please have the pharmacy call with any other refills you may need.  You will be contacted regarding the referral for: Cardiology - Dr Swaziland, and the colonoscopy for Feb 2016  Please keep your appointments with your specialists as you may have planned  Please return in 1 week, or sooner if needed

## 2014-03-21 NOTE — Assessment & Plan Note (Signed)
stable overall by history and exam, recent data reviewed with pt, and pt to continue medical treatment as before,  to f/u any worsening symptoms or concerns Lab Results  Component Value Date   HGBA1C 8.4* 11/10/2013    

## 2014-03-21 NOTE — Progress Notes (Signed)
Pre visit review using our clinic review tool, if applicable. No additional management support is needed unless otherwise documented below in the visit note. 

## 2014-03-21 NOTE — Progress Notes (Signed)
Subjective:    Patient ID: Linna DarnerJerry Wageman, male    DOB: November 03, 1945, 68 y.o.   MRN: [id number]  HPI  Here to f/u, unfortunately has been out of lasix for approx 1 mo, did not for some reason seem to understand the import of not taking the med, now with 10 lb wt increase in addition to 3-5 days gradaully increased sob/doe/orthopnea and LE edema.  Pt denies chest pain, wheezing, PND,palpitations, dizziness or syncope. Pt denies new neurological symptoms such as new headache, or facial or extremity weakness or numbness.  Pt denies polydipsia, polyuria,   Appears to have missed his 6 mo f/u appt with Dr SwazilandJordan in sept 2015  Also due for f/u colonoscopy, received letter in July, would like to have done in feb 2016 if possible. Past Medical History  Diagnosis Date  . DIABETES MELLITUS, TYPE II 11/07/2008  . HYPERLIPIDEMIA 11/07/2008  . ANXIETY 02/04/2009  . HYPERTENSION 10/08/2008  . 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 OSTEOARTHRITIS, KNEE, RIGHT 10/08/2008  . HIP PAIN, RIGHT 11/06/2009  . Chronic systolic CHF (congestive heart failure)   . Nonischemic cardiomyopathy     s/p ICD implantation 03-2013 by Dr Ladona Ridgelaylor  . Peripheral arterial disease    Past Surgical History  Procedure Laterality Date  . S/p left broken arm with pin      1980's  . Cardiac defibrillator placement Left 04/10/13    BTK single chamber ICD implanted by Dr Ladona Ridgelaylor for primary prevention    reports that he has quit smoking. He has never used smokeless tobacco. He reports that he does not drink alcohol or use illicit drugs. family history includes Diabetes in his mother; Hypertension in his mother. No Known Allergies Current Outpatient Prescriptions on File Prior to Visit  Medication Sig Dispense Refill  . aspirin 81 MG EC tablet Take 81 mg by mouth daily.      . carvedilol (COREG) 12.5 MG tablet TAKE 1 TABLET TWICE DAILY 180 tablet 0  . cetirizine (ZYRTEC) 10 MG tablet Take 1 tablet (10 mg total) by mouth daily. 30  tablet 11  . fluticasone (FLONASE) 50 MCG/ACT nasal spray Place 2 sprays into both nostrils daily. 16 g 2  . lisinopril (PRINIVIL,ZESTRIL) 20 MG tablet Take 1 tablet (20 mg total) by mouth daily. 90 tablet 3  . lovastatin (MEVACOR) 20 MG tablet Take 1 tablet (20 mg total) by mouth at bedtime. 90 tablet 3  . metFORMIN (GLUCOPHAGE XR) 500 MG 24 hr tablet 2 tabs by mouth in the AM 180 tablet 3  . spironolactone (ALDACTONE) 25 MG tablet Take 1 tablet (25 mg total) by mouth daily. 90 tablet 3   No current facility-administered medications on file prior to visit.   Review of Systems  Constitutional: Negative for unusual diaphoresis or other sweats  HENT: Negative for ringing in ear Eyes: Negative for double vision or worsening visual disturbance.  Respiratory: Negative for choking and stridor.   Gastrointestinal: Negative for vomiting or other signifcant bowel change Genitourinary: Negative for hematuria or decreased urine volume.  Musculoskeletal: Negative for other MSK pain or swelling Skin: Negative for color change and worsening wound.  Neurological: Negative for tremors and numbness other than noted  Psychiatric/Behavioral: Negative for decreased concentration or agitation other than above       Objective:   Physical Exam BP 132/80 mmHg  Pulse 90  Temp(Src) 98.9 F (37.2 C) (Oral)  Ht 6\' 1"  (1.854 m)  Wt 242 lb 2 oz (109.827 kg)  BMI 31.95 kg/m2  SpO2 95% VS noted,  Constitutional: Pt appears well-developed, well-nourished.  HENT: Head: NCAT.  Right Ear: External ear normal.  Left Ear: External ear normal.  Eyes: . Pupils are equal, round, and reactive to light. Conjunctivae and EOM are normal Neck: Normal range of motion. Neck supple.  Cardiovascular: Normal rate and regular rhythm.   Pulmonary/Chest: Effort normal and breath sounds decreased with bibasilar rales.  Abd:  Soft, NT, ND, + BS Neurological: Pt is alert. Not confused , motor grossly intact Skin: Skin is warm. No  rash but trace to 1+ edema to knees bilat left > right Psychiatric: Pt behavior is normal. No agitation.   Wt Readings from Last 3 Encounters:  03/21/14 242 lb 2 oz (109.827 kg)  11/10/13 236 lb 8 oz (107.276 kg)  10/31/13 232 lb (105.235 kg)         Assessment & Plan:

## 2014-03-22 ENCOUNTER — Encounter: Payer: Self-pay | Admitting: Gastroenterology

## 2014-03-28 ENCOUNTER — Other Ambulatory Visit (INDEPENDENT_AMBULATORY_CARE_PROVIDER_SITE_OTHER): Payer: Medicare Other

## 2014-03-28 ENCOUNTER — Encounter: Payer: Self-pay | Admitting: Internal Medicine

## 2014-03-28 ENCOUNTER — Ambulatory Visit (INDEPENDENT_AMBULATORY_CARE_PROVIDER_SITE_OTHER): Payer: Medicare Other | Admitting: Internal Medicine

## 2014-03-28 VITALS — BP 102/68 | HR 71 | Temp 97.9°F | Ht 73.0 in | Wt 228.0 lb

## 2014-03-28 DIAGNOSIS — I10 Essential (primary) hypertension: Secondary | ICD-10-CM

## 2014-03-28 DIAGNOSIS — I5023 Acute on chronic systolic (congestive) heart failure: Secondary | ICD-10-CM

## 2014-03-28 DIAGNOSIS — E0959 Drug or chemical induced diabetes mellitus with other circulatory complications: Secondary | ICD-10-CM | POA: Diagnosis not present

## 2014-03-28 LAB — BASIC METABOLIC PANEL
BUN: 25 mg/dL — AB (ref 6–23)
CO2: 27 mEq/L (ref 19–32)
CREATININE: 0.9 mg/dL (ref 0.4–1.5)
Calcium: 8.9 mg/dL (ref 8.4–10.5)
Chloride: 101 mEq/L (ref 96–112)
GFR: 103.14 mL/min (ref >60.00)
Glucose, Bld: 109 mg/dL — ABNORMAL HIGH (ref 70–99)
Potassium: 4.3 mEq/L (ref 3.5–5.1)
Sodium: 136 mEq/L (ref 135–145)

## 2014-03-28 MED ORDER — FUROSEMIDE 40 MG PO TABS: 40.0000 mg | ORAL_TABLET | Freq: Every day | ORAL | Status: DC

## 2014-03-28 NOTE — Assessment & Plan Note (Signed)
stable overall by history and exam, recent data reviewed with pt, and pt to continue medical treatment as before,  to f/u any worsening symptoms or concerns Lab Results  Component Value Date   HGBA1C 8.4* 11/10/2013   For DM shoe rx, f/u with podiatry as well dec 28 as planned

## 2014-03-28 NOTE — Progress Notes (Signed)
Subjective:    Patient ID: Jacob Pittman, male    DOB: 1946-04-09, 68 y.o.   MRN: [id number]  HPI  Here to f/u recent volume overload - acute on chronic sys chf after being out of his daily lasix 40 for approx 1 mo; has taken 40 bid for the past wk and overall doing ok,  Wt down 8 lbs in 1 wk, and nor further dyspnea or leg swelling. Pt denies chest pain, increased sob or doe, wheezing, orthopnea, PND, increased LE swelling, palpitations, dizziness or syncope. No leg cramps or other new symtpom.  Pt denies polydipsia, polyuria, or low sugar symptoms such as weakness or confusion improved with po intake.  Pt denies new neurological symptoms such as new headache, or facial or extremity weakness or numbness.   Pt states overall good compliance with meds usually   Wt Readings from Last 3 Encounters:  03/28/14 228 lb (103.42 kg)  03/21/14 242 lb 2 oz (109.827 kg)  11/10/13 236 lb 8 oz (107.276 kg)   Past Medical History  Diagnosis Date  . DIABETES MELLITUS, TYPE II 11/07/2008  . HYPERLIPIDEMIA 11/07/2008  . ANXIETY 02/04/2009  . HYPERTENSION 10/08/2008  . 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 OSTEOARTHRITIS, KNEE, RIGHT 10/08/2008  . HIP PAIN, RIGHT 11/06/2009  . Chronic systolic CHF (congestive heart failure)   . Nonischemic cardiomyopathy     s/p ICD implantation 03-2013 by Dr Ladona Ridgel  . Peripheral arterial disease    Past Surgical History  Procedure Laterality Date  . S/p left broken arm with pin      1980's  . Cardiac defibrillator placement Left 04/10/13    BTK single chamber ICD implanted by Dr Ladona Ridgel for primary prevention    reports that he has quit smoking. He has never used smokeless tobacco. He reports that he does not drink alcohol or use illicit drugs. family history includes Diabetes in his mother; Hypertension in his mother. No Known Allergies  Current Outpatient Prescriptions on File Prior to Visit  Medication Sig Dispense Refill  . aspirin 81 MG EC tablet Take 81  mg by mouth daily.      . carvedilol (COREG) 12.5 MG tablet TAKE 1 TABLET TWICE DAILY 180 tablet 0  . cetirizine (ZYRTEC) 10 MG tablet Take 1 tablet (10 mg total) by mouth daily. 30 tablet 11  . fluticasone (FLONASE) 50 MCG/ACT nasal spray Place 2 sprays into both nostrils daily. 16 g 2  . lisinopril (PRINIVIL,ZESTRIL) 20 MG tablet Take 1 tablet (20 mg total) by mouth daily. 90 tablet 3  . lovastatin (MEVACOR) 20 MG tablet Take 1 tablet (20 mg total) by mouth at bedtime. 90 tablet 3  . metFORMIN (GLUCOPHAGE XR) 500 MG 24 hr tablet 2 tabs by mouth in the AM 180 tablet 3  . spironolactone (ALDACTONE) 25 MG tablet Take 1 tablet (25 mg total) by mouth daily. 90 tablet 3   No current facility-administered medications on file prior to visit.    Review of Systems  Constitutional: Negative for unusual diaphoresis or other sweats  HENT: Negative for ringing in ear Eyes: Negative for double vision or worsening visual disturbance.  Respiratory: Negative for choking and stridor.   Gastrointestinal: Negative for vomiting or other signifcant bowel change Genitourinary: Negative for hematuria or decreased urine volume.  Musculoskeletal: Negative for other MSK pain or swelling Skin: Negative for color change and worsening wound.  Neurological: Negative for tremors and numbness other than noted  Psychiatric/Behavioral: Negative for decreased concentration or agitation  other than above       Objective   Physical Exam BP 102/68 mmHg  Pulse 71  Temp(Src) 97.9 F (36.6 C) (Oral)  Ht 6\' 1"  (1.854 m)  Wt 228 lb (103.42 kg)  BMI 30.09 kg/m2  SpO2 97% VS noted,  Constitutional: Pt appears well-developed, well-nourished.  HENT: Head: NCAT.  Right Ear: External ear normal.  Left Ear: External ear normal.  Eyes: . Pupils are equal, round, and reactive to light. Conjunctivae and EOM are normal Neck: Normal range of motion. Neck supple.  Cardiovascular: Normal rate and regular rhythm.   Pulmonary/Chest:  Effort normal and breath sounds normal.  - no rales or wheezing Neurological: Pt is alert. Not confused , motor grossly intact Skin: Skin is warm. No rash, no LE edema Psychiatric: Pt behavior is normal. No agitation.      Assessment & Plan:

## 2014-03-28 NOTE — Assessment & Plan Note (Signed)
Improved overall, to reduce lasix to 40 qd as was his usual prior dose, for bmet today, also f/u with cardiology

## 2014-03-28 NOTE — Patient Instructions (Addendum)
OK to decrease the lasix (furosemide) to 40 mg in the AM only  Please continue all other medications as before, and refills have been done if requested.  Please have the pharmacy call with any other refills you may need.  Please keep your appointments with your specialists as you may have planned  Please go to the LAB in the Basement (turn left off the elevator) for the tests to be done today  You should be called soon regarding the referral to Dr Jordan/cardiology  You will be contacted by phone if any changes need to be made immediately.  Otherwise, you will receive a letter about your results with an explanation, but please check with MyChart first.  You are also given the prescription order for the Diabetic shoes, so take this with you to see Dr Leeanne Deed  Please return on Jna 27 as planned

## 2014-03-28 NOTE — Assessment & Plan Note (Signed)
stable overall by history and exam, recent data reviewed with pt, and pt to continue medical treatment as before,  to f/u any worsening symptoms or concerns / BP Readings from Last 3 Encounters:  03/28/14 102/68  03/21/14 132/80  01/15/14 174/85

## 2014-03-28 NOTE — Progress Notes (Signed)
Pre visit review using our clinic review tool, if applicable. No additional management support is needed unless otherwise documented below in the visit note. 

## 2014-03-29 ENCOUNTER — Encounter (HOSPITAL_COMMUNITY): Payer: Self-pay | Admitting: Internal Medicine

## 2014-04-16 ENCOUNTER — Ambulatory Visit: Payer: Medicare Other | Admitting: Podiatry

## 2014-04-17 ENCOUNTER — Telehealth: Payer: Self-pay | Admitting: *Deleted

## 2014-04-17 ENCOUNTER — Encounter (HOSPITAL_BASED_OUTPATIENT_CLINIC_OR_DEPARTMENT_OTHER): Payer: Self-pay | Admitting: *Deleted

## 2014-04-17 ENCOUNTER — Emergency Department (HOSPITAL_COMMUNITY)
Admission: EM | Admit: 2014-04-17 | Discharge: 2014-04-17 | Discharge status: Against Medical Advice | Payer: Medicare Other | Source: Home / Self Care

## 2014-04-17 ENCOUNTER — Emergency Department (HOSPITAL_COMMUNITY): Payer: Medicare Other

## 2014-04-17 ENCOUNTER — Encounter (HOSPITAL_COMMUNITY): Payer: Self-pay | Admitting: Emergency Medicine

## 2014-04-17 ENCOUNTER — Emergency Department (HOSPITAL_BASED_OUTPATIENT_CLINIC_OR_DEPARTMENT_OTHER)
Admission: EM | Admit: 2014-04-17 | Discharge: 2014-04-18 | Disposition: A | Discharge status: Home / Self Care | Payer: Medicare Other | Attending: Emergency Medicine | Admitting: Emergency Medicine

## 2014-04-17 DIAGNOSIS — Z7982 Long term (current) use of aspirin: Secondary | ICD-10-CM | POA: Insufficient documentation

## 2014-04-17 DIAGNOSIS — I5022 Chronic systolic (congestive) heart failure: Secondary | ICD-10-CM | POA: Insufficient documentation

## 2014-04-17 DIAGNOSIS — Z7951 Long term (current) use of inhaled steroids: Secondary | ICD-10-CM | POA: Diagnosis not present

## 2014-04-17 DIAGNOSIS — Z95810 Presence of automatic (implantable) cardiac defibrillator: Secondary | ICD-10-CM | POA: Diagnosis not present

## 2014-04-17 DIAGNOSIS — Z8659 Personal history of other mental and behavioral disorders: Secondary | ICD-10-CM | POA: Insufficient documentation

## 2014-04-17 DIAGNOSIS — I10 Essential (primary) hypertension: Secondary | ICD-10-CM | POA: Insufficient documentation

## 2014-04-17 DIAGNOSIS — J159 Unspecified bacterial pneumonia: Secondary | ICD-10-CM | POA: Diagnosis not present

## 2014-04-17 DIAGNOSIS — Z79899 Other long term (current) drug therapy: Secondary | ICD-10-CM | POA: Insufficient documentation

## 2014-04-17 DIAGNOSIS — J189 Pneumonia, unspecified organism: Secondary | ICD-10-CM | POA: Diagnosis not present

## 2014-04-17 DIAGNOSIS — E785 Hyperlipidemia, unspecified: Secondary | ICD-10-CM | POA: Insufficient documentation

## 2014-04-17 DIAGNOSIS — Z8739 Personal history of other diseases of the musculoskeletal system and connective tissue: Secondary | ICD-10-CM | POA: Diagnosis not present

## 2014-04-17 DIAGNOSIS — E119 Type 2 diabetes mellitus without complications: Secondary | ICD-10-CM | POA: Insufficient documentation

## 2014-04-17 DIAGNOSIS — Z87891 Personal history of nicotine dependence: Secondary | ICD-10-CM | POA: Diagnosis not present

## 2014-04-17 DIAGNOSIS — R05 Cough: Secondary | ICD-10-CM | POA: Diagnosis not present

## 2014-04-17 DIAGNOSIS — R0981 Nasal congestion: Secondary | ICD-10-CM | POA: Diagnosis present

## 2014-04-17 DIAGNOSIS — R0602 Shortness of breath: Secondary | ICD-10-CM | POA: Insufficient documentation

## 2014-04-17 DIAGNOSIS — R509 Fever, unspecified: Secondary | ICD-10-CM | POA: Insufficient documentation

## 2014-04-17 DIAGNOSIS — R918 Other nonspecific abnormal finding of lung field: Secondary | ICD-10-CM | POA: Diagnosis not present

## 2014-04-17 DIAGNOSIS — R609 Edema, unspecified: Secondary | ICD-10-CM | POA: Insufficient documentation

## 2014-04-17 HISTORY — DX: Heart failure, unspecified: I50.9

## 2014-04-17 LAB — COMPREHENSIVE METABOLIC PANEL
ALK PHOS: 64 U/L (ref 39–117)
ALT: 26 U/L (ref 0–53)
AST: 55 U/L — AB (ref 0–37)
Albumin: 3.7 g/dL (ref 3.5–5.2)
Anion gap: 9 (ref 5–15)
BUN: 30 mg/dL — ABNORMAL HIGH (ref 6–23)
CALCIUM: 8.4 mg/dL (ref 8.4–10.5)
CO2: 24 mmol/L (ref 19–32)
Chloride: 100 mEq/L (ref 96–112)
Creatinine, Ser: 0.92 mg/dL (ref 0.50–1.35)
GFR, EST NON AFRICAN AMERICAN: 83 mL/min — AB (ref >90)
GLUCOSE: 128 mg/dL — AB (ref 70–99)
POTASSIUM: 3.6 mmol/L (ref 3.5–5.1)
SODIUM: 133 mmol/L — AB (ref 135–145)
Total Bilirubin: 2.7 mg/dL — ABNORMAL HIGH (ref 0.3–1.2)
Total Protein: 6.8 g/dL (ref 6.0–8.3)

## 2014-04-17 LAB — CBC
HCT: 38.6 % — ABNORMAL LOW (ref 39.0–52.0)
HEMOGLOBIN: 12.5 g/dL — AB (ref 13.0–17.0)
MCH: 32.2 pg (ref 26.0–34.0)
MCHC: 32.4 g/dL (ref 30.0–36.0)
MCV: 99.5 fL (ref 78.0–100.0)
PLATELETS: 142 10*3/uL — AB (ref 150–400)
RBC: 3.88 MIL/uL — AB (ref 4.22–5.81)
RDW: 12.9 % (ref 11.5–15.5)
WBC: 9.3 10*3/uL (ref 4.0–10.5)

## 2014-04-17 MED ORDER — ALBUTEROL SULFATE (2.5 MG/3ML) 0.083% IN NEBU
2.5000 mg | INHALATION_SOLUTION | Freq: Once | RESPIRATORY_TRACT | Status: AC
  Administered 2014-04-18: 2.5 mg via RESPIRATORY_TRACT
  Filled 2014-04-17: qty 3

## 2014-04-17 MED ORDER — ALBUTEROL SULFATE (2.5 MG/3ML) 0.083% IN NEBU: 5.0000 mg | INHALATION_SOLUTION | Freq: Once | RESPIRATORY_TRACT | Status: DC

## 2014-04-17 MED ORDER — IPRATROPIUM BROMIDE 0.02 % IN SOLN: 0.5000 mg | Freq: Once | RESPIRATORY_TRACT | Status: DC

## 2014-04-17 NOTE — ED Provider Notes (Addendum)
CSN: [id number]     Arrival date & time 04/17/14  2334 History  This chart was scribed for Gwyneth Sprout, MD by Roxy Cedar, ED Scribe. This patient was seen in room MH01/MH01 and the patient's care was started at 11:50 PM.   Chief Complaint  Patient presents with  . URI   Patient is a 68 y.o. male presenting with URI. The history is provided by the patient. No language interpreter was used.  URI Presenting symptoms: congestion and cough   Severity:  Moderate Onset quality:  Gradual Duration:  4 days Timing:  Constant Progression:  Worsening Chronicity:  New Relieved by:  Nothing Worsened by:  Nothing tried Ineffective treatments:  Nebulizer treatments  HPI Comments: Rae Pivirotto is a 68 y.o. male with a PMHx of Diabetes, Hyperlipidemia, Hypertension, Osteoarthritis, Hip Pain and CHF, who presents to the Emergency Department complaining of moderate cough, wheezing, decreased appetite, fever, and hiccups that began 4 days ago. He denies associated chest pain, SOB or emesis. Patient was seen earlier today at [facility] but wait was so long that he left and came here. Patient states he has second hand smoke exposure. Per daughter, patient tried using his grandson's albuterol treatment with no relief at home.  Past Medical History  Diagnosis Date  . DIABETES MELLITUS, TYPE II 11/07/2008  . HYPERLIPIDEMIA 11/07/2008  . ANXIETY 02/04/2009  . HYPERTENSION 10/08/2008  . 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 OSTEOARTHRITIS, KNEE, RIGHT 10/08/2008  . HIP PAIN, RIGHT 11/06/2009  . Chronic systolic CHF (congestive heart failure)   . Nonischemic cardiomyopathy     s/p ICD implantation 03-2013 by Dr Ladona Ridgel  . Peripheral arterial disease   . CHF (congestive heart failure)    Past Surgical History  Procedure Laterality Date  . S/p left broken arm with pin      1980's  . Cardiac defibrillator placement Left 04/10/13    BTK single chamber ICD implanted by Dr Ladona Ridgel for primary  prevention  . Implantable cardioverter defibrillator implant N/A 04/10/2013    Procedure: IMPLANTABLE CARDIOVERTER DEFIBRILLATOR IMPLANT;  Surgeon: Marinus Maw, MD;  Location: [facility] CATH LAB;  Service: Cardiovascular;  Laterality: N/A;   Family History  Problem Relation Age of Onset  . Diabetes Mother   . Hypertension Mother    History  Substance Use Topics  . Smoking status: Former Games developer  . Smokeless tobacco: Never Used  . Alcohol Use: No   Review of Systems  HENT: Positive for congestion.   Respiratory: Positive for cough and shortness of breath.   All other systems reviewed and are negative.  Allergies  Review of patient's allergies indicates no known allergies.  Home Medications   Prior to Admission medications   Medication Sig Start Date End Date Taking? Authorizing Provider  aspirin 81 MG EC tablet Take 81 mg by mouth daily.      Historical Provider, MD  carvedilol (COREG) 12.5 MG tablet TAKE 1 TABLET TWICE DAILY 12/29/13   Peter M [place], MD  cetirizine (ZYRTEC) 10 MG tablet Take 1 tablet (10 mg total) by mouth daily. 09/22/13   [name], MD  fluticasone (FLONASE) 50 MCG/ACT nasal spray Place 2 sprays into both nostrils daily. 09/22/13   [name], MD  furosemide (LASIX) 40 MG tablet Take 1 tablet (40 mg total) by mouth daily. 03/28/14   [name], MD  lisinopril (PRINIVIL,ZESTRIL) 20 MG tablet Take 1 tablet (20 mg total) by mouth daily. 10/14/12   [name],  MD  lovastatin (MEVACOR) 20 MG tablet Take 1 tablet (20 mg total) by mouth at bedtime. 09/22/13   Corwin LevinsJames W John, MD  metFORMIN (GLUCOPHAGE XR) 500 MG 24 hr tablet 2 tabs by mouth in the AM 11/14/13   Corwin LevinsJames W John, MD  spironolactone (ALDACTONE) 25 MG tablet Take 1 tablet (25 mg total) by mouth daily. 12/01/12   Peter M [place]az[place]Jordan, MD   Triage Vitals: BP 141/74 mmHg  Pulse 87  Temp(Src) 99.1 F (37.3 C)  Resp 18  Wt 228 lb (103.42 kg)  SpO2 97%  Physical Exam  Constitutional: He is oriented to person, place, and  time. He appears well-developed and well-nourished. No distress.  HENT:  Head: Normocephalic and atraumatic.  Dry mucous membranes.  Eyes: Conjunctivae and EOM are normal.  Neck: Neck supple. No tracheal deviation present.  Cardiovascular: Normal rate.   Pulmonary/Chest: Effort normal. No respiratory distress. He has wheezes. He has rhonchi.  Diffuse expiratory wheezing. Rhonchi in the right lower lobe.  Abdominal: There is no tenderness.  Musculoskeletal: Normal range of motion.  Trace bilateral edema in lower extremities.  Neurological: He is alert and oriented to person, place, and time.  Skin: Skin is warm and dry.  Psychiatric: He has a normal mood and affect. His behavior is normal.  Nursing note and vitals reviewed.  ED Course  Procedures (including critical care time)  DIAGNOSTIC STUDIES: Oxygen Saturation is 97% on RA, normal by my interpretation.    COORDINATION OF CARE: 11:54 PM- Discussed plans to give patient albuterol breathing treatment. Pt advised of plan for treatment and pt agrees.  Labs Review Labs Reviewed - No data to display  Imaging Review Dg Chest 2 View  04/17/2014   CLINICAL DATA:  Cough and congestion.  Fever  EXAM: CHEST  2 VIEW  COMPARISON:  09/22/2013  FINDINGS: Left chest wall ICD is noted with lead in the right ventricle. The heart size is enlarged. There is asymmetric airspace opacity within the right lung base worrisome for pneumonia. No effusion or edema identified.  IMPRESSION: 1. Right lower lobe airspace opacity is concerning for pneumonia.   Electronically Signed   By: Signa Kellaylor  Stroud M.D.   On: 04/17/2014 19:56    EKG Interpretation None     MDM   Final diagnoses:  CAP (community acquired pneumonia)    Patient with cough, congestion, fever, wheezing and decreased appetite for the last 4 days.  Patient with wheezing on exam but denies any shortness of breath. He went to [facility] today but because he waited in the waiting room so  long lasting came here. Labs done there were a CBC, CMP which were without acute findings. His chest x-ray showed a right lower lobe pneumonia.  Wheezing somewhat improved with albuterol and Atrovent. Patient is febrile here to 100.3. He was given Rocephin and azithromycin.  Discussed with pt and family admission vs trial at home with oral abx.  Pt and family choosing to go home and returning if symptoms worsening.  I personally performed the services described in this documentation, which was scribed in my presence. The recorded information has been reviewed and is accurate.   Gwyneth SproutWhitney Dezeray Puccio, MD 04/18/14 [id number]  Gwyneth SproutWhitney Esiquio Boesen, MD 04/18/14 [phone]

## 2014-04-17 NOTE — ED Notes (Addendum)
Pt c/o URI symptoms x 4 days  Seen at [facility] xray and labs done, left [facility] d/t wait

## 2014-04-17 NOTE — ED Notes (Addendum)
Pt c/o SOB and wheezing x 2 days. Pt also has cough, pt has cough as well. Pt has CHF.Pt has been losing appetite and getting increasing weak. Pt has also been breaking into sweats.

## 2014-04-17 NOTE — ED Notes (Signed)
Pt called x2 to exam room, no answer from lobby. Will try again.

## 2014-04-17 NOTE — Telephone Encounter (Signed)
[facility] - Client TELEPHONE ADVICE RECORD [facility] Medical Call Center Patient Name: Jacob Pittman Gender: Male DOB: 1945-12-16 Age: 68 Y 7 M 10 D Return Phone Number: [phone] (Primary) Address: [address] City/State/Zip: [place] [address] Client [facility] - Client Client Site [facility] Day Physician Oliver Barre Contact Type Call Call Type Triage / Clinical Caller Name Vikki Ports Relationship To Patient Daughter Return Phone Number [phone] (Primary) Chief Complaint WHEEZING Initial Comment Caller states father has cold symptoms. wheezing PreDisposition Call Doctor Nurse Assessment Nurse: Gasper Sells, RN, Marylu Lund Date/Time Lamount Cohen Time): 04/16/2014 5:24:44 PM Confirm and document reason for call. If symptomatic, describe symptoms. ---Caller states father has cold symptoms. Is wheezing. Has CHF. Sitting up in chair all night. Has the patient traveled out of the country within the last 30 days? ---Not Applicable Does the patient require triage? ---Yes Related visit to physician within the last 2 weeks? ---Yes fluid on him. increased his diuretic, Does the PT have any chronic conditions? (i.e. diabetes, asthma, etc.) ---Yes List chronic conditions. ---CHF, HTN, borderline diabetic on the pills Guidelines Guideline Title Affirmed Question Affirmed Notes Nurse Date/Time (Eastern Time) Cough - Acute NonProductive SEVERE coughing spells (e.g., whooping sound after coughing, vomiting after coughing) Quentin Cornwall 04/16/2014 5:27:35 PM Disp. Time Lamount Cohen Time) Disposition Final User 04/16/2014 4:42:53 PM Send to Urgent Domingo Madeira 04/16/2014 4:43:57 PM Send to Urgent Queue Lorin Mercy 04/16/2014 4:52:05 PM Attempt made - message left Quentin Cornwall 04/16/2014 5:03:16 PM Attempt made - message left Quentin Cornwall 04/16/2014 5:32:00 PM See Physician within 24 Hours Yes  Gasper Sells, RN, Marylu Lund PLEASE NOTE: All timestamps contained within this report are represented as [place] Standard Time. CONFIDENTIALTY NOTICE: This fax transmission is intended only for the addressee. It contains information that is legally privileged, confidential or otherwise protected from use or disclosure. If you are not the intended recipient, you are strictly prohibited from reviewing, disclosing, copying using or disseminating any of this information or taking any action in reliance on or regarding this information. If you have received this fax in error, please notify us immediately by telephone so that we can arrange for its return to [place]. Phone: [phone], Toll-Free: [phone], Fax: [phone] Page: 2 of 2 Call Id: [id number] Caller Understands: Yes Disagree/Comply: Comply Care Advice Given Per Guideline SEE PHYSICIAN WITHIN 24 HOURS: COUGHING SPELLS: * Drink warm fluids. Inhale warm mist. (Reason: both relax the airway and loosen up the phlegm) * Suck on cough drops or hard candy to coat the irritated throat. HUMIDIFIER: If the air is dry, use a humidifier in the bedroom. (Reason: dry air makes coughs worse) VOMITING WITH COUGHING: Eat smaller amounts with meal and snack to reduce the chances of repeated vomiting. (Reason: vomiting is more likely with a full stomach) CALL BACK IF: * Difficulty breathing occurs * You become worse. CARE ADVICE given per Cough - Acute Non-Productive (Adult) guideline. After Care Instructions Given Call Event Type User Date / Time Description

## 2014-04-17 NOTE — ED Notes (Signed)
Pt c/o cough, congestion x 4 days,, had labs, xray and ekg done at [facility] but left [facility] due to wait

## 2014-04-18 MED ORDER — ACETAMINOPHEN 325 MG PO TABS
650.0000 mg | ORAL_TABLET | Freq: Once | ORAL | Status: AC
  Administered 2014-04-18: 650 mg via ORAL
  Filled 2014-04-18: qty 2

## 2014-04-18 MED ORDER — CEPHALEXIN 500 MG PO CAPS: 500.0000 mg | ORAL_CAPSULE | Freq: Four times a day (QID) | ORAL | Status: DC

## 2014-04-18 MED ORDER — ALBUTEROL SULFATE HFA 108 (90 BASE) MCG/ACT IN AERS
2.0000 | INHALATION_SPRAY | RESPIRATORY_TRACT | Status: DC | PRN
  Administered 2014-04-18: 2 via RESPIRATORY_TRACT
  Filled 2014-04-18: qty 6.7

## 2014-04-18 MED ORDER — AZITHROMYCIN 250 MG PO TABS: 250.0000 mg | ORAL_TABLET | Freq: Every day | ORAL | Status: DC

## 2014-04-18 MED ORDER — LIDOCAINE HCL (PF) 1 % IJ SOLN
2.0000 mL | Freq: Once | INTRAMUSCULAR | Status: AC
  Administered 2014-04-18: 2 mL via INTRADERMAL
  Filled 2014-04-18: qty 5

## 2014-04-18 MED ORDER — GI COCKTAIL ~~LOC~~
30.0000 mL | Freq: Once | ORAL | Status: AC
  Administered 2014-04-18: 30 mL via ORAL
  Filled 2014-04-18: qty 30

## 2014-04-18 MED ORDER — AZITHROMYCIN 250 MG PO TABS
500.0000 mg | ORAL_TABLET | Freq: Once | ORAL | Status: AC
  Administered 2014-04-18: 500 mg via ORAL
  Filled 2014-04-18: qty 2

## 2014-04-18 MED ORDER — IPRATROPIUM-ALBUTEROL 0.5-2.5 (3) MG/3ML IN SOLN
3.0000 mL | Freq: Once | RESPIRATORY_TRACT | Status: AC
  Administered 2014-04-18: 3 mL via RESPIRATORY_TRACT
  Filled 2014-04-18: qty 3

## 2014-04-18 MED ORDER — CEFTRIAXONE SODIUM 1 G IJ SOLR
1.0000 g | Freq: Once | INTRAMUSCULAR | Status: AC
  Administered 2014-04-18: 1 g via INTRAMUSCULAR
  Filled 2014-04-18: qty 10

## 2014-04-26 ENCOUNTER — Encounter: Payer: Self-pay | Admitting: Internal Medicine

## 2014-04-30 ENCOUNTER — Ambulatory Visit (INDEPENDENT_AMBULATORY_CARE_PROVIDER_SITE_OTHER): Payer: Medicare Other | Admitting: Podiatry

## 2014-04-30 ENCOUNTER — Encounter: Payer: Self-pay | Admitting: Podiatry

## 2014-04-30 VITALS — BP 197/96 | HR 68 | Resp 16 | Ht 73.0 in | Wt 230.0 lb

## 2014-04-30 DIAGNOSIS — M79676 Pain in unspecified toe(s): Secondary | ICD-10-CM | POA: Diagnosis not present

## 2014-04-30 DIAGNOSIS — B351 Tinea unguium: Secondary | ICD-10-CM

## 2014-04-30 DIAGNOSIS — E1151 Type 2 diabetes mellitus with diabetic peripheral angiopathy without gangrene: Secondary | ICD-10-CM

## 2014-04-30 NOTE — Progress Notes (Signed)
   Subjective:    Patient ID: Jacob Pittman, male    DOB: May 13, 1945, 69 y.o.   MRN: [id number]  HPI Comments: Pt wishes to have his toenails trimmed and to be evaluated for diabetic foot exam and diabetic shoes.   He denies any history of foot ulceration He describes a history of claudication He presents with a note from his primary care physician Dr. Oliver Barre of [facility], prescribing diabetic shoes on a prescription blank  This patient was last in our office on 01/16/2014 and at that time mycotic toenails and keratoses were debrided. As a history of peripheral arterial disease evaluated by Dr. Nanetta Batty on 10/31/2013. The pedal pulses were diminished on the visit of 10/09/2013 and I referred him to Dr. Allyson Sabal for evaluation   Review of Systems  Respiratory: Positive for shortness of breath.        Pt treated for pneumonia 2 weeks ago.  All other systems reviewed and are negative.      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 Oliver Barre for diabetic shoes and return patient for measuring of diabetic shoes upon certification  Reappoint 3 months for debridement of symptomatic mycotic toenails

## 2014-04-30 NOTE — Patient Instructions (Signed)
Diabetes and Foot Care Diabetes may cause you to have problems because of poor blood supply (circulation) to your feet and legs. This may cause the skin on your feet to become thinner, break easier, and heal more slowly. Your skin may become dry, and the skin may peel and crack. You may also have nerve damage in your legs and feet causing decreased feeling in them. You may not notice minor injuries to your feet that could lead to infections or more serious problems. Taking care of your feet is one of the most important things you can do for yourself.  HOME CARE INSTRUCTIONS  Wear shoes at all times, even in the house. Do not go barefoot. Bare feet are easily injured.  Check your feet daily for blisters, cuts, and redness. If you cannot see the bottom of your feet, use a mirror or ask someone for help.  Wash your feet with warm water (do not use hot water) and mild soap. Then pat your feet and the areas between your toes until they are completely dry. Do not soak your feet as this can dry your skin.  Apply a moisturizing lotion or petroleum jelly (that does not contain alcohol and is unscented) to the skin on your feet and to dry, brittle toenails. Do not apply lotion between your toes.  Trim your toenails straight across. Do not dig under them or around the cuticle. File the edges of your nails with an emery board or nail file.  Do not cut corns or calluses or try to remove them with medicine.  Wear clean socks or stockings every day. Make sure they are not too tight. Do not wear knee-high stockings since they may decrease blood flow to your legs.  Wear shoes that fit properly and have enough cushioning. To break in new shoes, wear them for just a few hours a day. This prevents you from injuring your feet. Always look in your shoes before you put them on to be sure there are no objects inside.  Do not cross your legs. This may decrease the blood flow to your feet.  If you find a minor scrape,  cut, or break in the skin on your feet, keep it and the skin around it clean and dry. These areas may be cleansed with mild soap and water. Do not cleanse the area with peroxide, alcohol, or iodine.  When you remove an adhesive bandage, be sure not to damage the skin around it.  If you have a wound, look at it several times a day to make sure it is healing.  Do not use heating pads or hot water bottles. They may burn your skin. If you have lost feeling in your feet or legs, you may not know it is happening until it is too late.  Make sure your health care provider performs a complete foot exam at least annually or more often if you have foot problems. Report any cuts, sores, or bruises to your health care provider immediately. SEEK MEDICAL CARE IF:   You have an injury that is not healing.  You have cuts or breaks in the skin.  You have an ingrown nail.  You notice redness on your legs or feet.  You feel burning or tingling in your legs or feet.  You have pain or cramps in your legs and feet.  Your legs or feet are numb.  Your feet always feel cold. SEEK IMMEDIATE MEDICAL CARE IF:   There is increasing redness,   swelling, or pain in or around a wound.  There is a red line that goes up your leg.  Pus is coming from a wound.  You develop a fever or as directed by your health care provider.  You notice a bad smell coming from an ulcer or wound. Document Released: 04/03/2000 Document Revised: 12/07/2012 Document Reviewed: 09/13/2012 ExitCare Patient Information 2015 [employer]. This information is not intended to replace advice given to you by your health care provider. Make sure you discuss any questions you have with your health care provider.  

## 2014-05-01 ENCOUNTER — Encounter: Payer: Self-pay | Admitting: *Deleted

## 2014-05-01 ENCOUNTER — Encounter: Payer: Self-pay | Admitting: Podiatry

## 2014-05-15 ENCOUNTER — Encounter: Payer: Self-pay | Admitting: Internal Medicine

## 2014-05-16 ENCOUNTER — Ambulatory Visit: Payer: Medicare Other | Admitting: Internal Medicine

## 2014-05-18 ENCOUNTER — Ambulatory Visit (INDEPENDENT_AMBULATORY_CARE_PROVIDER_SITE_OTHER): Payer: Medicare Other | Admitting: Cardiology

## 2014-05-18 ENCOUNTER — Encounter: Payer: Self-pay | Admitting: Cardiology

## 2014-05-18 VITALS — BP 132/84 | HR 77 | Ht 73.0 in | Wt 219.5 lb

## 2014-05-18 DIAGNOSIS — E0951 Drug or chemical induced diabetes mellitus with diabetic peripheral angiopathy without gangrene: Secondary | ICD-10-CM | POA: Diagnosis not present

## 2014-05-18 DIAGNOSIS — Z95810 Presence of automatic (implantable) cardiac defibrillator: Secondary | ICD-10-CM | POA: Diagnosis not present

## 2014-05-18 DIAGNOSIS — I10 Essential (primary) hypertension: Secondary | ICD-10-CM

## 2014-05-18 DIAGNOSIS — I5022 Chronic systolic (congestive) heart failure: Secondary | ICD-10-CM

## 2014-05-18 DIAGNOSIS — I739 Peripheral vascular disease, unspecified: Secondary | ICD-10-CM

## 2014-05-18 NOTE — Patient Instructions (Signed)
Continue your current therapy  Stay away from salt   We will schedule you for an Echocardiogram  I will see you in 6 months.

## 2014-05-18 NOTE — Progress Notes (Signed)
[name] Date of Birth: 08-06-1945 Medical Record #[id number]  History of Present Illness: Mr. Vastine is seen for followup of his congestive heart failure. 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 EF of 20% despite optimal medical therapy. He is s/p ICD placement in December 2014. He also has a history of PAD. He was seen by Dr. Allyson Sabal but wasn't having symptoms so conservative therapy was recommended. He was treated in the ED 04/17/14 for RLL PNA. He reports he is feeling very well.  He remains abstinent from alcohol. He denies any SOB, chest pain, palpitations, or edema. Weight is down 11 lbs. No swelling.  Current Outpatient Prescriptions on File Prior to Visit  Medication Sig Dispense Refill  . aspirin 81 MG EC tablet Take 81 mg by mouth daily.      . carvedilol (COREG) 12.5 MG tablet TAKE 1 TABLET TWICE DAILY 180 tablet 0  . cephALEXin (KEFLEX) 500 MG capsule Take 1 capsule (500 mg total) by mouth 4 (four) times daily. 40 capsule 0  . cetirizine (ZYRTEC) 10 MG tablet Take 1 tablet (10 mg total) by mouth daily. 30 tablet 11  . fluticasone (FLONASE) 50 MCG/ACT nasal spray Place 2 sprays into both nostrils daily. 16 g 2  . furosemide (LASIX) 40 MG tablet Take 1 tablet (40 mg total) by mouth daily. 90 tablet 3  . lisinopril (PRINIVIL,ZESTRIL) 20 MG tablet Take 1 tablet (20 mg total) by mouth daily. 90 tablet 3  . lovastatin (MEVACOR) 20 MG tablet Take 1 tablet (20 mg total) by mouth at bedtime. 90 tablet 3  . metFORMIN (GLUCOPHAGE XR) 500 MG 24 hr tablet 2 tabs by mouth in the AM 180 tablet 3  . spironolactone (ALDACTONE) 25 MG tablet Take 1 tablet (25 mg total) by mouth daily. 90 tablet 3   No current facility-administered medications on file prior to visit.    No Known Allergies  Past Medical History  Diagnosis Date  . DIABETES MELLITUS, TYPE II 11/07/2008  . HYPERLIPIDEMIA 11/07/2008  . ANXIETY 02/04/2009  .  HYPERTENSION 10/08/2008  . 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 OSTEOARTHRITIS, KNEE, RIGHT 10/08/2008  . HIP PAIN, RIGHT 11/06/2009  . Chronic systolic CHF (congestive heart failure)   . Nonischemic cardiomyopathy     s/p ICD implantation 03-2013 by Dr Ladona Ridgel  . Peripheral arterial disease   . CHF (congestive heart failure)     Past Surgical History  Procedure Laterality Date  . S/p left broken arm with pin      1980's  . Cardiac defibrillator placement Left 04/10/13    BTK single chamber ICD implanted by Dr Ladona Ridgel for primary prevention  . Implantable cardioverter defibrillator implant N/A 04/10/2013    Procedure: IMPLANTABLE CARDIOVERTER DEFIBRILLATOR IMPLANT;  Surgeon: Marinus Maw, MD;  Location: [facility] CATH LAB;  Service: Cardiovascular;  Laterality: N/A;    History  Smoking status  . Former Smoker  Smokeless tobacco  . Never Used    History  Alcohol Use No    Family History  Problem Relation Age of Onset  . Diabetes Mother   . Hypertension Mother     Review of Systems: As noted in history of present illness.  All other systems were reviewed and are negative.  Physical Exam: BP 132/84 mmHg  Pulse 77  Ht  (1.854 m)  Wt 219 lb 8 oz (99.565 kg)  BMI 28.97 kg/m2 He is a very pleasant, obese black male in no  acute distress. HEENT: Normal. Neck is supple without bruits, adenopathy, or thyromegaly. No JVD. Lungs: Clear Cardiovascular: Regular rate and rhythm. Normal S1 and S2. No gallop, murmur, or click. ICD site has healed well. Abdomen: Obese, soft, nontender. Bowel sounds are positive. No bladder splenomegaly. Extremities: No cyanosis. No edema. Pedal pulses are 2+. Skin: Warm and dry Neuro: Alert and oriented x3. Cranial nerves II through XII are intact. No focal findings.  LABORATORY DATA: Lab Results  Component Value Date   WBC 9.3 04/17/2014   HGB 12.5* 04/17/2014   HCT 38.6* 04/17/2014   PLT 142* 04/17/2014   GLUCOSE 128* 04/17/2014   CHOL  159 11/10/2013   TRIG 67.0 11/10/2013   HDL 41.40 11/10/2013   LDLDIRECT 141.6 05/12/2013   LDLCALC 104* 11/10/2013   ALT 26 04/17/2014   AST 55* 04/17/2014   NA 133* 04/17/2014   K 3.6 04/17/2014   CL 100 04/17/2014   CREATININE 0.92 04/17/2014   BUN 30* 04/17/2014   CO2 24 04/17/2014   TSH 1.21 05/12/2013   PSA 1.67 05/12/2013   INR 1.1* 04/03/2013   HGBA1C 8.4* 11/10/2013   MICROALBUR 0.6 05/12/2013   Ecg from 04/17/14 reviewed and showed a new RBBB. LAFB.    Assessment / Plan: 1. Chronic systolic congestive heart failure. Ejection fraction 20% despite optimal medical therapy. This is probably  related to chronic moonshine abuse. No significant ischemia or infarction noted on Myoview study. Patient is no longer drinking alcohol. He is on appropriate therapy with lisinopril, carvedilol, Aldactone, and Lasix. Weight is down 11 lbs. We will continue sodium restriction 2 g daily. I have recommended repeat Echo to compare with Oct. 2014.  I will follow up in 6 months.  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 Hypertension-controlled  4. DM type 2.  5. S/p prophylactic ICD. Needs follow up in device clinic  6. PAD- remains asymptomatic. Has regular follow up with podiatry.

## 2014-05-22 ENCOUNTER — Ambulatory Visit: Payer: Medicare Other | Admitting: Internal Medicine

## 2014-05-23 ENCOUNTER — Encounter: Payer: Self-pay | Admitting: Internal Medicine

## 2014-05-23 ENCOUNTER — Ambulatory Visit (INDEPENDENT_AMBULATORY_CARE_PROVIDER_SITE_OTHER): Payer: Medicare Other | Admitting: Internal Medicine

## 2014-05-23 ENCOUNTER — Other Ambulatory Visit (INDEPENDENT_AMBULATORY_CARE_PROVIDER_SITE_OTHER): Payer: Medicare Other

## 2014-05-23 ENCOUNTER — Other Ambulatory Visit: Payer: Self-pay | Admitting: Internal Medicine

## 2014-05-23 VITALS — BP 110/72 | HR 65 | Temp 98.3°F | Ht 73.0 in | Wt 227.0 lb

## 2014-05-23 DIAGNOSIS — E0951 Drug or chemical induced diabetes mellitus with diabetic peripheral angiopathy without gangrene: Secondary | ICD-10-CM

## 2014-05-23 DIAGNOSIS — I10 Essential (primary) hypertension: Secondary | ICD-10-CM

## 2014-05-23 DIAGNOSIS — N320 Bladder-neck obstruction: Secondary | ICD-10-CM

## 2014-05-23 DIAGNOSIS — E785 Hyperlipidemia, unspecified: Secondary | ICD-10-CM | POA: Diagnosis not present

## 2014-05-23 LAB — HEPATIC FUNCTION PANEL
ALK PHOS: 84 U/L (ref 39–117)
ALT: 24 U/L (ref 0–53)
AST: 20 U/L (ref 0–37)
Albumin: 3.8 g/dL (ref 3.5–5.2)
BILIRUBIN TOTAL: 0.9 mg/dL (ref 0.2–1.2)
Bilirubin, Direct: 0.4 mg/dL — ABNORMAL HIGH (ref 0.0–0.3)
TOTAL PROTEIN: 6.3 g/dL (ref 6.0–8.3)

## 2014-05-23 LAB — TSH: TSH: 2.13 u[IU]/mL (ref 0.35–4.50)

## 2014-05-23 LAB — URINALYSIS, ROUTINE W REFLEX MICROSCOPIC
Nitrite: POSITIVE — AB
Specific Gravity, Urine: >=1.03 — AB (ref 1.000–1.030)
TOTAL PROTEIN, URINE-UPE24: 30 — AB
Urine Glucose: NEGATIVE
pH: 6 (ref 5.0–8.0)

## 2014-05-23 LAB — CBC WITH DIFFERENTIAL/PLATELET
Basophils Absolute: 0 10*3/uL (ref 0.0–0.1)
Basophils Relative: 0.9 % (ref 0.0–3.0)
Eosinophils Absolute: 0.2 10*3/uL (ref 0.0–0.7)
Eosinophils Relative: 2.9 % (ref 0.0–5.0)
HCT: 40.1 % (ref 39.0–52.0)
HEMOGLOBIN: 13.6 g/dL (ref 13.0–17.0)
LYMPHS ABS: 1.6 10*3/uL (ref 0.7–4.0)
LYMPHS PCT: 29.7 % (ref 12.0–46.0)
MCHC: 34 g/dL (ref 30.0–36.0)
MCV: 96.3 fl (ref 78.0–100.0)
MONO ABS: 0.6 10*3/uL (ref 0.1–1.0)
MONOS PCT: 11.7 % (ref 3.0–12.0)
Neutro Abs: 2.9 10*3/uL (ref 1.4–7.7)
Neutrophils Relative %: 54.8 % (ref 43.0–77.0)
Platelets: 222 10*3/uL (ref 150.0–400.0)
RBC: 4.16 Mil/uL — ABNORMAL LOW (ref 4.22–5.81)
RDW: 14.4 % (ref 11.5–15.5)
WBC: 5.4 10*3/uL (ref 4.0–10.5)

## 2014-05-23 LAB — LIPID PANEL
Cholesterol: 151 mg/dL (ref 0–200)
HDL: 28.8 mg/dL — AB (ref >39.00)
LDL Cholesterol: 95 mg/dL (ref 0–99)
NONHDL: 122.2
Total CHOL/HDL Ratio: 5
Triglycerides: 135 mg/dL (ref 0.0–149.0)
VLDL: 27 mg/dL (ref 0.0–40.0)

## 2014-05-23 LAB — BASIC METABOLIC PANEL
BUN: 22 mg/dL (ref 6–23)
CO2: 29 mEq/L (ref 19–32)
CREATININE: 0.9 mg/dL (ref 0.40–1.50)
Calcium: 9.1 mg/dL (ref 8.4–10.5)
Chloride: 107 mEq/L (ref 96–112)
GFR: 107.07 mL/min (ref >60.00)
GLUCOSE: 102 mg/dL — AB (ref 70–99)
POTASSIUM: 4.7 meq/L (ref 3.5–5.1)
SODIUM: 140 meq/L (ref 135–145)

## 2014-05-23 LAB — MICROALBUMIN / CREATININE URINE RATIO
Creatinine,U: 263.3 mg/dL
MICROALB/CREAT RATIO: 10.9 mg/g (ref 0.0–30.0)
Microalb, Ur: 28.6 mg/dL — ABNORMAL HIGH (ref 0.0–1.9)

## 2014-05-23 LAB — HEMOGLOBIN A1C: HEMOGLOBIN A1C: 7.1 % — AB (ref 4.6–6.5)

## 2014-05-23 LAB — PSA: PSA: 14.7 ng/mL — ABNORMAL HIGH (ref 0.10–4.00)

## 2014-05-23 MED ORDER — CIPROFLOXACIN HCL 500 MG PO TABS: 500.0000 mg | ORAL_TABLET | Freq: Two times a day (BID) | ORAL | Status: DC

## 2014-05-23 NOTE — Progress Notes (Signed)
Subjective:    Patient ID: Jacob Pittman, male    DOB: December 10, 1945, 69 y.o.   MRN: [id number]  HPI    Here for yearly f/u;  Overall doing ok;  Pt denies CP, worsening SOB, DOE, wheezing, orthopnea, PND, worsening LE edema, palpitations, dizziness or syncope.  Pt denies neurological change such as new headache, facial or extremity weakness.  Pt denies polydipsia, polyuria, or low sugar symptoms. Pt states overall good compliance with treatment and medications, good tolerability, and has been trying to follow lower cholesterol diet.  Pt denies worsening depressive symptoms, suicidal ideation or panic. No fever, night sweats, wt loss, loss of appetite, or other constitutional symptoms.  Pt states good ability with ADL's, has low fall risk, home safety reviewed and adequate, no other significant changes in hearing or vision, and only occasionally active with exercise. No current compalints, has f/u with cardiology later this wk,  Has seen podiatry, will get DM shoes soon Past Medical History  Diagnosis Date  . DIABETES MELLITUS, TYPE II 11/07/2008  . HYPERLIPIDEMIA 11/07/2008  . ANXIETY 02/04/2009  . HYPERTENSION 10/08/2008  . 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 OSTEOARTHRITIS, KNEE, RIGHT 10/08/2008  . HIP PAIN, RIGHT 11/06/2009  . Chronic systolic CHF (congestive heart failure)   . Nonischemic cardiomyopathy     s/p ICD implantation 03-2013 by Dr Ladona Ridgel  . Peripheral arterial disease   . CHF (congestive heart failure)    Past Surgical History  Procedure Laterality Date  . S/p left broken arm with pin      1980's  . Cardiac defibrillator placement Left 04/10/13    BTK single chamber ICD implanted by Dr Ladona Ridgel for primary prevention  . Implantable cardioverter defibrillator implant N/A 04/10/2013    Procedure: IMPLANTABLE CARDIOVERTER DEFIBRILLATOR IMPLANT;  Surgeon: Marinus Maw, MD;  Location: [facility] CATH LAB;  Service: Cardiovascular;  Laterality: N/A;    reports that he has quit  smoking. He has never used smokeless tobacco. He reports that he does not drink alcohol or use illicit drugs. family history includes Diabetes in his mother; Hypertension in his mother. No Known Allergies Current Outpatient Prescriptions on File Prior to Visit  Medication Sig Dispense Refill  . aspirin 81 MG EC tablet Take 81 mg by mouth daily.      . carvedilol (COREG) 12.5 MG tablet TAKE 1 TABLET TWICE DAILY 180 tablet 0  . cephALEXin (KEFLEX) 500 MG capsule Take 1 capsule (500 mg total) by mouth 4 (four) times daily. 40 capsule 0  . cetirizine (ZYRTEC) 10 MG tablet Take 1 tablet (10 mg total) by mouth daily. 30 tablet 11  . fluticasone (FLONASE) 50 MCG/ACT nasal spray Place 2 sprays into both nostrils daily. 16 g 2  . furosemide (LASIX) 40 MG tablet Take 1 tablet (40 mg total) by mouth daily. 90 tablet 3  . lisinopril (PRINIVIL,ZESTRIL) 20 MG tablet Take 1 tablet (20 mg total) by mouth daily. 90 tablet 3  . lovastatin (MEVACOR) 20 MG tablet Take 1 tablet (20 mg total) by mouth at bedtime. 90 tablet 3  . metFORMIN (GLUCOPHAGE XR) 500 MG 24 hr tablet 2 tabs by mouth in the AM 180 tablet 3  . spironolactone (ALDACTONE) 25 MG tablet Take 1 tablet (25 mg total) by mouth daily. 90 tablet 3   No current facility-administered medications on file prior to visit.   Review of Systems Constitutional: Negative for increased diaphoresis, other activity, appetite or other siginficant weight change  HENT: Negative for worsening hearing  loss, ear pain, facial swelling, mouth sores and neck stiffness.   Eyes: Negative for other worsening pain, redness or visual disturbance.  Respiratory: Negative for shortness of breath and wheezing.   Cardiovascular: Negative for chest pain and palpitations.  Gastrointestinal: Negative for diarrhea, blood in stool, abdominal distention or other pain Genitourinary: Negative for hematuria, flank pain or change in urine volume.  Musculoskeletal: Negative for myalgias or  other joint complaints.  Skin: Negative for color change and wound.  Neurological: Negative for syncope and numbness. other than noted Hematological: Negative for adenopathy. or other swelling Psychiatric/Behavioral: Negative for hallucinations, self-injury, decreased concentration or other worsening agitation.      Objective:   Physical Exam BP 110/72 mmHg  Pulse 65  Temp(Src) 98.3 F (36.8 C) (Oral)  Ht  (1.854 m)  Wt 227 lb (102.967 kg)  BMI 29.96 kg/m2  SpO2 98% VS noted,  Constitutional: Pt is oriented to person, place, and time. Appears well-developed and well-nourished.  Head: Normocephalic and atraumatic.  Right Ear: External ear normal.  Left Ear: External ear normal.  Nose: Nose normal.  Mouth/Throat: Oropharynx is clear and moist.  Eyes: Conjunctivae and EOM are normal. Pupils are equal, round, and reactive to light.  Neck: Normal range of motion. Neck supple. No JVD present. No tracheal deviation present.  Cardiovascular: Normal rate, regular rhythm, normal heart sounds and intact distal pulses.   Pulmonary/Chest: Effort normal and breath sounds without rales or wheezing  Abdominal: Soft. Bowel sounds are normal. NT. No HSM  Musculoskeletal: Normal range of motion. Exhibits trace edema LE's to mid leg only  Lymphadenopathy:  Has no cervical adenopathy.  Neurological: Pt is alert and oriented to person, place, and time. Pt has normal reflexes. No cranial nerve deficit. Motor grossly intact Skin: Skin is warm and dry. No rash noted.  Psychiatric:  Has normal mood and affect. Behavior is normal.     Assessment & Plan:

## 2014-05-23 NOTE — Progress Notes (Signed)
Pre visit review using our clinic review tool, if applicable. No additional management support is needed unless otherwise documented below in the visit note. 

## 2014-05-23 NOTE — Patient Instructions (Signed)

## 2014-05-23 NOTE — Assessment & Plan Note (Signed)
stable overall by history and exam, recent data reviewed with pt, and pt to continue medical treatment as before,  to f/u any worsening symptoms or concerns Lab Results  Component Value Date   HGBA1C 8.4* 11/10/2013

## 2014-05-23 NOTE — Assessment & Plan Note (Signed)
stable overall by history and exam, recent data reviewed with pt, and pt to continue medical treatment as before,  to f/u any worsening symptoms or concerns BP Readings from Last 3 Encounters:  05/23/14 110/72  05/18/14 132/84  04/30/14 197/96

## 2014-05-23 NOTE — Assessment & Plan Note (Signed)
stable overall by history and exam, recent data reviewed with pt, and pt to continue medical treatment as before,  to f/u any worsening symptoms or concerns Lab Results  Component Value Date   LDLCALC 104* 11/10/2013

## 2014-05-25 ENCOUNTER — Ambulatory Visit (HOSPITAL_COMMUNITY)
Admission: RE | Admit: 2014-05-25 | Discharge: 2014-05-25 | Disposition: A | Discharge status: Home / Self Care | Payer: Medicare Other | Source: Ambulatory Visit | Attending: Cardiology | Admitting: Cardiology

## 2014-05-25 DIAGNOSIS — E785 Hyperlipidemia, unspecified: Secondary | ICD-10-CM | POA: Insufficient documentation

## 2014-05-25 DIAGNOSIS — I739 Peripheral vascular disease, unspecified: Secondary | ICD-10-CM | POA: Insufficient documentation

## 2014-05-25 DIAGNOSIS — E0951 Drug or chemical induced diabetes mellitus with diabetic peripheral angiopathy without gangrene: Secondary | ICD-10-CM | POA: Diagnosis not present

## 2014-05-25 DIAGNOSIS — I429 Cardiomyopathy, unspecified: Secondary | ICD-10-CM | POA: Diagnosis not present

## 2014-05-25 DIAGNOSIS — I340 Nonrheumatic mitral (valve) insufficiency: Secondary | ICD-10-CM | POA: Diagnosis not present

## 2014-05-25 DIAGNOSIS — I272 Other secondary pulmonary hypertension: Secondary | ICD-10-CM | POA: Diagnosis not present

## 2014-05-25 DIAGNOSIS — Z95810 Presence of automatic (implantable) cardiac defibrillator: Secondary | ICD-10-CM | POA: Diagnosis not present

## 2014-05-25 DIAGNOSIS — I5022 Chronic systolic (congestive) heart failure: Secondary | ICD-10-CM | POA: Insufficient documentation

## 2014-05-25 DIAGNOSIS — I10 Essential (primary) hypertension: Secondary | ICD-10-CM | POA: Diagnosis not present

## 2014-05-25 NOTE — Progress Notes (Signed)
2D Echocardiogram Complete.  05/25/2014   Jacob Pittman, RDCS  Preliminary Technician Findings:   The Ejection Fraction is severely decreased (5-10%), reduced from echocardiogram on 02-10-13.   Jacob Pittman appears stable and does not complain of any chest pain or shortness of breath. These findings were reviewed with Dr. Rollene Rotunda prior to the patient leaving.

## 2014-05-28 ENCOUNTER — Telehealth: Payer: Self-pay | Admitting: *Deleted

## 2014-05-28 NOTE — Telephone Encounter (Signed)
Patient no show PV today, called his #'s and left message, then seen the information to contact his daughter. Called his daughter Jacob Pittman and left message. Patient needs to have office visit with Dr.Jacobs before colonoscopy due to his recent heart echo result on 05/25/14, EF=15%. Appointments for PV and colonoscopy cancelled and no show letter mailed to his address.

## 2014-05-30 ENCOUNTER — Telehealth: Payer: Self-pay | Admitting: Cardiology

## 2014-05-30 NOTE — Telephone Encounter (Signed)
Pt would like his echo results,(that what test he think it was),returning call from yesterday.

## 2014-05-30 NOTE — Telephone Encounter (Signed)
Call returned, results discussed.

## 2014-06-01 ENCOUNTER — Telehealth: Payer: Self-pay

## 2014-06-01 NOTE — Telephone Encounter (Signed)
Received message from EP scheduler Melissa she has left several messages for daughter Vikki Ports to call and schedule appointment with Dr.Taylor.Vikki Ports has never returned phone call to schedule appointment with Dr.Taylor.

## 2014-06-11 ENCOUNTER — Encounter: Payer: Medicare Other | Admitting: Gastroenterology

## 2014-07-04 ENCOUNTER — Encounter: Payer: Medicare Other | Admitting: Internal Medicine

## 2014-07-13 ENCOUNTER — Telehealth: Payer: Self-pay | Admitting: Internal Medicine

## 2014-07-13 NOTE — Telephone Encounter (Signed)
Returned call to patient he stated he has been sob for the past 2 days.Swelling in both feet and lower legs.Stated he ate ham yesterday.Stated today sob and swelling better.Advised no salt.Spoke to Dr.Jordan he advised he can take a extra 40 mg lasix if needed.

## 2014-07-13 NOTE — Telephone Encounter (Signed)
Patient c/o mild SOB on exertion. Informed patient that Dr. Ladona Ridgel is not in the office today.  Dr. Swaziland is patient's primary cardiologist. Instructed patient to call [facility] and ask for triage as his doctor is DOD today. Called Dr. Elvis Coil nurse at [facility] and made her aware of situation. She is to speak with Dr. Swaziland and contact the patient.

## 2014-07-13 NOTE — Telephone Encounter (Signed)
New Message  Pt daughter calling w/ pt.   Pt c/o Shortness Of Breath: STAT if SOB developed within the last 24 hours or pt is noticeably SOB on the phone  1. Are you currently SOB (can you hear that pt is SOB on the phone)? yes  2. How long have you been experiencing SOB? Couple of days   3. Are you SOB when sitting or when up moving around? Up moving around  4. Are you currently experiencing any other symptoms? Little swelling, taking water pills

## 2014-07-19 ENCOUNTER — Ambulatory Visit (INDEPENDENT_AMBULATORY_CARE_PROVIDER_SITE_OTHER): Payer: Medicare Other | Admitting: Internal Medicine

## 2014-07-19 ENCOUNTER — Encounter: Payer: Self-pay | Admitting: Internal Medicine

## 2014-07-19 VITALS — BP 132/80 | HR 69 | Temp 98.0°F | Resp 18 | Ht 73.0 in | Wt 229.0 lb

## 2014-07-19 DIAGNOSIS — I10 Essential (primary) hypertension: Secondary | ICD-10-CM | POA: Diagnosis not present

## 2014-07-19 DIAGNOSIS — J309 Allergic rhinitis, unspecified: Secondary | ICD-10-CM

## 2014-07-19 DIAGNOSIS — R062 Wheezing: Secondary | ICD-10-CM | POA: Diagnosis not present

## 2014-07-19 DIAGNOSIS — E0951 Drug or chemical induced diabetes mellitus with diabetic peripheral angiopathy without gangrene: Secondary | ICD-10-CM

## 2014-07-19 MED ORDER — ALBUTEROL SULFATE HFA 108 (90 BASE) MCG/ACT IN AERS
2.0000 | INHALATION_SPRAY | Freq: Four times a day (QID) | RESPIRATORY_TRACT | Status: AC | PRN
Start: 2014-07-19 — End: ?

## 2014-07-19 MED ORDER — PREDNISONE 10 MG PO TABS: ORAL_TABLET | ORAL | Status: DC

## 2014-07-19 MED ORDER — METHYLPREDNISOLONE ACETATE 80 MG/ML IJ SUSP
120.0000 mg | Freq: Once | INTRAMUSCULAR | Status: AC
Start: 2014-07-19 — End: 2014-07-19
  Administered 2014-07-19: 120 mg via INTRAMUSCULAR

## 2014-07-19 NOTE — Assessment & Plan Note (Signed)
New onset for him, Mild to mod, for depomedrol IM, predpac asd, zyrtec and otc nasacort aq,  to f/u any worsening symptoms or concerns

## 2014-07-19 NOTE — Progress Notes (Signed)
Pre visit review using our clinic review tool, if applicable. No additional management support is needed unless otherwise documented below in the visit note. 

## 2014-07-19 NOTE — Patient Instructions (Signed)
You had the steroid shot today  Please take all new medication as prescribed - the prednisone  Please take all new medication as prescribed  - the inhaler  Please continue all other medications as before, and refills have been done if requested.  Please have the pharmacy call with any other refills you may need.  Please keep your appointments with your specialists as you may have planned

## 2014-07-19 NOTE — Assessment & Plan Note (Signed)
stable overall by history and exam, recent data reviewed with pt, and pt to continue medical treatment as before,  to f/u any worsening symptoms or concerns BP Readings from Last 3 Encounters:  07/19/14 132/80  05/23/14 110/72  05/18/14 132/84

## 2014-07-19 NOTE — Assessment & Plan Note (Signed)
Likely asthma exacerbation related to allergy flare - for tx as above, and proair hfa prn,  to f/u any worsening symptoms or concerns

## 2014-07-19 NOTE — Progress Notes (Signed)
Subjective:    Patient ID: Jacob Pittman, male    DOB: 03-08-1946, 69 y.o.   MRN: [id number]  HPI  Here with 3 days onset gradually worsening head congestion without fever or ST but with clearish congestion, itch and sneezing, but with today rather marked facial puffiness and swelling to sinus and periorbital, as well as onset non prod cough with wheezing and mild sob/doe today.  Pt denies chest pain, increased  orthopnea, PND, increased LE swelling, palpitations, dizziness or syncope  Past Medical History  Diagnosis Date  . DIABETES MELLITUS, TYPE II 11/07/2008  . HYPERLIPIDEMIA 11/07/2008  . ANXIETY 02/04/2009  . HYPERTENSION 10/08/2008  . 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 OSTEOARTHRITIS, KNEE, RIGHT 10/08/2008  . HIP PAIN, RIGHT 11/06/2009  . Chronic systolic CHF (congestive heart failure)   . Nonischemic cardiomyopathy     s/p ICD implantation 03-2013 by Dr Ladona Ridgel  . Peripheral arterial disease   . CHF (congestive heart failure)    Past Surgical History  Procedure Laterality Date  . S/p left broken arm with pin      1980's  . Cardiac defibrillator placement Left 04/10/13    BTK single chamber ICD implanted by Dr Ladona Ridgel for primary prevention  . Implantable cardioverter defibrillator implant N/A 04/10/2013    Procedure: IMPLANTABLE CARDIOVERTER DEFIBRILLATOR IMPLANT;  Surgeon: Marinus Maw, MD;  Location: [facility] CATH LAB;  Service: Cardiovascular;  Laterality: N/A;    reports that he has quit smoking. He has never used smokeless tobacco. He reports that he does not drink alcohol or use illicit drugs. family history includes Diabetes in his mother; Hypertension in his mother. No Known Allergies Current Outpatient Prescriptions on File Prior to Visit  Medication Sig Dispense Refill  . aspirin 81 MG EC tablet Take 81 mg by mouth daily.      . carvedilol (COREG) 12.5 MG tablet TAKE 1 TABLET TWICE DAILY 180 tablet 0  . cephALEXin (KEFLEX) 500 MG capsule Take 1 capsule (500 mg  total) by mouth 4 (four) times daily. 40 capsule 0  . cetirizine (ZYRTEC) 10 MG tablet Take 1 tablet (10 mg total) by mouth daily. 30 tablet 11  . ciprofloxacin (CIPRO) 500 MG tablet Take 1 tablet (500 mg total) by mouth 2 (two) times daily. 20 tablet 0  . fluticasone (FLONASE) 50 MCG/ACT nasal spray Place 2 sprays into both nostrils daily. 16 g 2  . furosemide (LASIX) 40 MG tablet Take 1 tablet (40 mg total) by mouth daily. 90 tablet 3  . lisinopril (PRINIVIL,ZESTRIL) 20 MG tablet Take 1 tablet (20 mg total) by mouth daily. 90 tablet 3  . lovastatin (MEVACOR) 20 MG tablet Take 1 tablet (20 mg total) by mouth at bedtime. 90 tablet 3  . metFORMIN (GLUCOPHAGE XR) 500 MG 24 hr tablet 2 tabs by mouth in the AM 180 tablet 3  . spironolactone (ALDACTONE) 25 MG tablet Take 1 tablet (25 mg total) by mouth daily. 90 tablet 3   No current facility-administered medications on file prior to visit.   Review of Systems  Constitutional: Negative for unusual diaphoresis or night sweats HENT: Negative for ringing in ear or discharge Eyes: Negative for double vision or worsening visual disturbance.  Respiratory: Negative for choking and stridor.   Gastrointestinal: Negative for vomiting or other signifcant bowel change Genitourinary: Negative for hematuria or change in urine volume.  Musculoskeletal: Negative for other MSK pain or swelling Skin: Negative for color change and worsening wound.  Neurological: Negative for tremors  and numbness other than noted  Psychiatric/Behavioral: Negative for decreased concentration or agitation other than above       Objective:   Physical Exam BP 132/80 mmHg  Pulse 69  Temp(Src) 98 F (36.7 C) (Oral)  Resp 18  Ht  (1.854 m)  Wt 229 lb 0.6 oz (103.892 kg)  BMI 30.22 kg/m2  SpO2 92% VS noted, not ill appearing Constitutional: Pt appears in no significant distress HENT: Head: NCAT.  Right Ear: External ear normal.  Left Ear: External ear normal.  Eyes: .  Pupils are equal, round, and reactive to light. Conjunctivae and EOM are normal Bilat tm's with mild erythema.  Max sinus areas non tender but marked facial puffiness/swelling.  Pharynx with mild erythema, no exudate Neck: Normal range of motion. Neck supple.  Cardiovascular: Normal rate and regular rhythm.   Pulmonary/Chest: Effort normal and breath sounds decreased without rales but + mild bilat wheezing.  Neurological: Pt is alert. Not confused , motor grossly intact Skin: Skin is warm. No rash, no LE edema Psychiatric: Pt behavior is normal. No agitation.     Assessment & Plan:

## 2014-07-19 NOTE — Assessment & Plan Note (Signed)
stable overall by history and exam, recent data reviewed with pt, and pt to continue medical treatment as before,  to f/u any worsening symptoms or concerns Lab Results  Component Value Date   HGBA1C 7.1* 05/23/2014   To call for onset polys or cbg > 200 on steroid tx

## 2014-08-06 ENCOUNTER — Ambulatory Visit: Payer: Medicare Other | Admitting: Podiatry

## 2014-08-08 ENCOUNTER — Telehealth: Payer: Self-pay | Admitting: Internal Medicine

## 2014-08-08 ENCOUNTER — Other Ambulatory Visit: Payer: Self-pay

## 2014-08-08 MED ORDER — CARVEDILOL 12.5 MG PO TABS: 12.5000 mg | ORAL_TABLET | Freq: Two times a day (BID) | ORAL | Status: DC

## 2014-08-08 NOTE — Telephone Encounter (Signed)
Pharmacy called in carvedilol (COREG) 12.5 MG tablet [[id number] and is waiting for approval. Pharmacy [facility] on [address]

## 2014-08-08 NOTE — Telephone Encounter (Signed)
Ok for #60 only -   Pt needs F/u with Dr Swaziland for further refills

## 2014-08-08 NOTE — Telephone Encounter (Signed)
Patient last received this medication refill last year in September. In the comments section, it says that he needs to see Dr. Swaziland for further refills. He saw Dr. Swaziland back in January.

## 2014-08-17 ENCOUNTER — Encounter: Payer: Medicare Other | Admitting: Internal Medicine

## 2014-08-21 ENCOUNTER — Encounter: Payer: Self-pay | Admitting: Internal Medicine

## 2014-08-27 ENCOUNTER — Encounter: Payer: Self-pay | Admitting: Podiatry

## 2014-08-27 ENCOUNTER — Ambulatory Visit (INDEPENDENT_AMBULATORY_CARE_PROVIDER_SITE_OTHER): Payer: Medicare Other | Admitting: Podiatry

## 2014-08-27 DIAGNOSIS — B351 Tinea unguium: Secondary | ICD-10-CM | POA: Diagnosis not present

## 2014-08-27 DIAGNOSIS — M79676 Pain in unspecified toe(s): Secondary | ICD-10-CM

## 2014-08-28 NOTE — Progress Notes (Signed)
Patient ID: Tyller Sartini, male   DOB: [birth year], 69 y.o.   MRN: [id number]  Subjective: This patient presents today complaining of painful toenails and requests toenail debridement. He was last evaluated on 04/30/2014 and certification for diabetic shoes was obtained from Dr. Jonny Ruiz. The patient never responded to our phone calls to return for measuring for diabetic shoes  Objective: The toenails are extremely elongated, hypertrophic, discolored and tender to direct palpation HAV deformities bilaterally  Assessment: History of decreased posterior tibial pulses bilaterally History of peripheral arterial disease that has been evaluated by Dr. Nanetta Batty Type II diabetic  Plan: Debridement of toenails 10 without any bleeding  Resubmit certification for diabetic shoes for the indication of: Decreased posterior tibial and DP pulses bilaterally is Peripheral arterial disease bilaterally with vascular lab confirmation HAV deformities  Reappoint 3 months and notify patient upon receipt of certification for diabetic shoes

## 2014-09-01 ENCOUNTER — Other Ambulatory Visit: Payer: Self-pay | Admitting: Internal Medicine

## 2014-09-19 ENCOUNTER — Ambulatory Visit (INDEPENDENT_AMBULATORY_CARE_PROVIDER_SITE_OTHER): Payer: Medicare Other | Admitting: Internal Medicine

## 2014-09-19 ENCOUNTER — Encounter: Payer: Self-pay | Admitting: Internal Medicine

## 2014-09-19 VITALS — BP 120/80 | HR 70 | Ht 73.0 in | Wt 229.0 lb

## 2014-09-19 DIAGNOSIS — I5022 Chronic systolic (congestive) heart failure: Secondary | ICD-10-CM

## 2014-09-19 DIAGNOSIS — Z95810 Presence of automatic (implantable) cardiac defibrillator: Secondary | ICD-10-CM | POA: Diagnosis not present

## 2014-09-19 DIAGNOSIS — R0602 Shortness of breath: Secondary | ICD-10-CM | POA: Diagnosis not present

## 2014-09-19 DIAGNOSIS — E785 Hyperlipidemia, unspecified: Secondary | ICD-10-CM

## 2014-09-19 DIAGNOSIS — R609 Edema, unspecified: Secondary | ICD-10-CM | POA: Diagnosis not present

## 2014-09-19 DIAGNOSIS — I428 Other cardiomyopathies: Secondary | ICD-10-CM

## 2014-09-19 DIAGNOSIS — I10 Essential (primary) hypertension: Secondary | ICD-10-CM | POA: Diagnosis not present

## 2014-09-19 DIAGNOSIS — I429 Cardiomyopathy, unspecified: Secondary | ICD-10-CM

## 2014-09-19 LAB — CUP PACEART INCLINIC DEVICE CHECK
Battery Voltage: 3.12 V
Date Time Interrogation Session: 20160601112225
HighPow Impedance: 61 Ohm
Lead Channel Pacing Threshold Pulse Width: 0.4 ms
Lead Channel Sensing Intrinsic Amplitude: 17.1 mV
Lead Channel Setting Pacing Amplitude: 2 V
MDC IDC MSMT LEADCHNL RA SENSING INTR AMPL: 0.4 mV
MDC IDC MSMT LEADCHNL RV IMPEDANCE VALUE: 449 Ohm
MDC IDC MSMT LEADCHNL RV PACING THRESHOLD AMPLITUDE: 0.5 V
MDC IDC PG SERIAL: 60733905
MDC IDC SET LEADCHNL RV PACING PULSEWIDTH: 0.4 ms
MDC IDC SET LEADCHNL RV SENSING SENSITIVITY: 0.8 mV
MDC IDC STAT BRADY RV PERCENT PACED: 5 %

## 2014-09-19 MED ORDER — FUROSEMIDE 40 MG PO TABS: ORAL_TABLET | ORAL | Status: DC

## 2014-09-19 NOTE — Assessment & Plan Note (Signed)
He notes increased dyspnea. He admits to some sodium indiscretion. I have asked the patient to increase his dose of lasix to 60 mg daily and he will return in 2 weeks to check electrolytes. He is encouraged to reduce his salt intake.

## 2014-09-19 NOTE — Assessment & Plan Note (Signed)
His blood pressure is controlled. He will continue his current meds except for the additional lasix.

## 2014-09-19 NOTE — Assessment & Plan Note (Signed)
He is encouraged to maintain a low fat diet.  

## 2014-09-19 NOTE — Progress Notes (Signed)
HPI Jacob Pittman returns today for followup. He is a pleasant 69 yo man with a h/o DM, HTN, chronic systolic heart failure, s/p ICD implant. In the interim, he has done well except for some increase in dyspnea and peripheral edema. He denies chest pain. No syncope. No ICD shock. No peripheral edema. After implant the atrial portion of his ICD (VDD mode) was not sensing atrial activity but now is although the p waves are low. No Known Allergies   Current Outpatient Prescriptions  Medication Sig Dispense Refill  . albuterol (PROVENTIL HFA;VENTOLIN HFA) 108 (90 BASE) MCG/ACT inhaler Inhale 2 puffs into the lungs every 6 (six) hours as needed for wheezing or shortness of breath. 1 Inhaler 5  . aspirin 81 MG EC tablet Take 81 mg by mouth daily.      . carvedilol (COREG) 12.5 MG tablet TAKE 1 TABLET (12.5 MG TOTAL) BY MOUTH 2 (TWO) TIMES DAILY. 60 tablet 11  . cephALEXin (KEFLEX) 500 MG capsule Take 1 capsule (500 mg total) by mouth 4 (four) times daily. 40 capsule 0  . cetirizine (ZYRTEC) 10 MG tablet Take 1 tablet (10 mg total) by mouth daily. 30 tablet 11  . fluticasone (FLONASE) 50 MCG/ACT nasal spray Place 2 sprays into both nostrils daily. 16 g 2  . furosemide (LASIX) 40 MG tablet Take 1 tablet (40 mg total) by mouth daily. 90 tablet 3  . lisinopril (PRINIVIL,ZESTRIL) 20 MG tablet Take 1 tablet (20 mg total) by mouth daily. 90 tablet 3  . lovastatin (MEVACOR) 20 MG tablet Take 1 tablet (20 mg total) by mouth at bedtime. 90 tablet 3  . metFORMIN (GLUCOPHAGE XR) 500 MG 24 hr tablet 2 tabs by mouth in the AM 180 tablet 3  . spironolactone (ALDACTONE) 25 MG tablet Take 1 tablet (25 mg total) by mouth daily. 90 tablet 3   No current facility-administered medications for this visit.     Past Medical History  Diagnosis Date  . DIABETES MELLITUS, TYPE II 11/07/2008  . HYPERLIPIDEMIA 11/07/2008  . ANXIETY 02/04/2009  . HYPERTENSION 10/08/2008  . 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  OSTEOARTHRITIS, KNEE, RIGHT 10/08/2008  . HIP PAIN, RIGHT 11/06/2009  . Chronic systolic CHF (congestive heart failure)   . Nonischemic cardiomyopathy     s/p ICD implantation 03-2013 by Dr Ladona Ridgel  . Peripheral arterial disease   . CHF (congestive heart failure)     ROS:   All systems reviewed and negative except as noted in the HPI.   Past Surgical History  Procedure Laterality Date  . S/p left broken arm with pin      1980's  . Cardiac defibrillator placement Left 04/10/13    BTK single chamber ICD implanted by Dr Ladona Ridgel for primary prevention  . Implantable cardioverter defibrillator implant N/A 04/10/2013    Procedure: IMPLANTABLE CARDIOVERTER DEFIBRILLATOR IMPLANT;  Surgeon: Marinus Maw, MD;  Location: [facility] CATH LAB;  Service: Cardiovascular;  Laterality: N/A;     Family History  Problem Relation Age of Onset  . Diabetes Mother   . Hypertension Mother      History   Social History  . Marital Status: Legally Separated    Spouse Name: N/A  . Number of Children: 2  . Years of Education: N/A   Occupational History  . retired [employer]    Social History Main Topics  . Smoking status: Former Games developer  . Smokeless tobacco: Never Used  . Alcohol Use: No  .  Drug Use: No  . Sexual Activity: No   Other Topics Concern  . Not on file   Social History Narrative   Married/separated although wife comes to appts. With him   10th grade education - poor reading and writing ability   Incarcerated for 6 months in 2009.     BP 120/80 mmHg  Pulse 70  Ht  (1.854 m)  Wt 229 lb (103.874 kg)  BMI 30.22 kg/m2  Physical Exam:  Well appearing middle aged man, NAD HEENT: Unremarkable Neck:  No JVD, no thyromegally Back:  No CVA tenderness Lungs:  Clear with no wheezes HEART:  Regular rate rhythm, no murmurs, no rubs, no clicks Abd:  soft, positive bowel sounds, no organomegally, no rebound, no guarding Ext:  2 plus pulses, no edema, no cyanosis, no clubbing Skin:   No rashes no nodules Neuro:  CN II through XII intact, motor grossly intact   DEVICE  Normal device function.  See PaceArt for details.   Assess/Plan:

## 2014-09-19 NOTE — Patient Instructions (Addendum)
Medication Instructions:  Your physician has recommended you make the following change in your medication:  1) INCREASE Lasix to 60 mg daily   Labwork: Your physician recommends that you return for lab work in: 2 weeks (BNP) - Appointment on 10/03/2014   Testing/Procedures: NONE  Follow-Up: Your physician wants you to follow-up in: 12 months with Dr. Ladona Ridgel. You will receive a reminder letter in the mail two months in advance. If you don't receive a letter, please call our office to schedule the follow-up appointment.  Remote monitoring is used to monitor your Pacemaker or ICD from home. This monitoring reduces the number of office visits required to check your device to one time per year. It allows [place] to keep an eye on the functioning of your device to ensure it is working properly. You are scheduled for a device check from home on 12/19/2014. You may send your transmission at any time that day. If you have a wireless device, the transmission will be sent automatically. After your physician reviews your transmission, you will receive a postcard with your next transmission date.    Any Other Special Instructions Will Be Listed Below (If Applicable).

## 2014-09-19 NOTE — Assessment & Plan Note (Signed)
His biotronik ICD is working normally. Will recheck in several months.

## 2014-09-24 ENCOUNTER — Encounter: Payer: Self-pay | Admitting: Internal Medicine

## 2014-10-01 ENCOUNTER — Telehealth: Payer: Self-pay | Admitting: Physician Assistant

## 2014-10-01 ENCOUNTER — Telehealth: Payer: Self-pay | Admitting: Internal Medicine

## 2014-10-01 NOTE — Telephone Encounter (Signed)
Pt c/o Shortness Of Breath: STAT if SOB developed within the last 24 hours or pt is noticeably SOB on the phone  1. Are you currently SOB (can you hear that pt is SOB on the phone)? Yes  2. How long have you been experiencing SOB? For the last few days  3. Are you SOB when sitting or when up moving around? Sitting and laying down  4. Are you currently experiencing any other symptoms? Tired a lot  Pt's daughter calling.

## 2014-10-01 NOTE — Telephone Encounter (Signed)
Left message to call back on both lines.

## 2014-10-01 NOTE — Telephone Encounter (Signed)
Called daughter back. Patient had SOB, swelling BLE and weakness on last office visit.  Patient was started on Lasix 60 mg PO daily. Patient's swelling in legs have improved. Patient only gets SOB when laying down at night. Patient is coming in for a BMET on Wednesday. Will forward to Dr. Ladona Ridgel for further instructions.

## 2014-10-01 NOTE — Telephone Encounter (Signed)
Received page that patient's wife was requesting to speak to [facility] as she just missed a call from her. I called over to the office in Triage and passed on the request to [facility]. Ronie Spies PA-C

## 2014-10-03 ENCOUNTER — Telehealth: Payer: Self-pay | Admitting: *Deleted

## 2014-10-03 ENCOUNTER — Other Ambulatory Visit (INDEPENDENT_AMBULATORY_CARE_PROVIDER_SITE_OTHER): Payer: Medicare Other | Admitting: *Deleted

## 2014-10-03 ENCOUNTER — Telehealth: Payer: Self-pay | Admitting: Cardiology

## 2014-10-03 DIAGNOSIS — R609 Edema, unspecified: Secondary | ICD-10-CM | POA: Diagnosis not present

## 2014-10-03 DIAGNOSIS — R0602 Shortness of breath: Secondary | ICD-10-CM | POA: Diagnosis not present

## 2014-10-03 DIAGNOSIS — Z95810 Presence of automatic (implantable) cardiac defibrillator: Secondary | ICD-10-CM

## 2014-10-03 DIAGNOSIS — I10 Essential (primary) hypertension: Secondary | ICD-10-CM | POA: Diagnosis not present

## 2014-10-03 LAB — BASIC METABOLIC PANEL
BUN: 24 mg/dL — ABNORMAL HIGH (ref 6–23)
CHLORIDE: 99 meq/L (ref 96–112)
CO2: 28 meq/L (ref 19–32)
Calcium: 9.1 mg/dL (ref 8.4–10.5)
Creatinine, Ser: 1.18 mg/dL (ref 0.40–1.50)
GFR: 78.24 mL/min (ref >60.00)
Glucose, Bld: 227 mg/dL — ABNORMAL HIGH (ref 70–99)
Potassium: 4.3 mEq/L (ref 3.5–5.1)
SODIUM: 134 meq/L — AB (ref 135–145)

## 2014-10-03 LAB — BRAIN NATRIURETIC PEPTIDE: Pro B Natriuretic peptide (BNP): 3036 pg/mL — ABNORMAL HIGH (ref 0.0–100.0)

## 2014-10-03 NOTE — Telephone Encounter (Signed)
Vikki Ports (daughter) is calling because Mr. Ciak is having some shortness of breath , tired, no energy , coughing swelling in face , not sleeping well at night because he  has the shortness of breath. He went to get lab work done and was told to increase his lasik per Dr. Ladona Ridgel . Please call .Marland Kitchen   Thanks

## 2014-10-03 NOTE — Telephone Encounter (Signed)
Patient walked in clinic with daughter present. He had appointment for blood work today.  BNP and BMET were collected.  Pt requested to talk to nurse regarding swelling and SOB. On 6/1 visit Dr. Ladona Ridgel (seen for ICD) increased lasix to 60 mg daily.  Pt has no peripheral edema, he states that resolved with the increased lasix; however his face remains swollen. (he denies starting any new meds and reports the facial swelling comes and goes with the peripheral edema)  His SOB has not improved with higher dose of lasix.  He sleeps on pillows or sitting up in chair.   His lungs are clear to auscultation.    Discussed with Dr. Ladona Ridgel, who rec increase lasix to 40 mg twice daily and follow soon with Dr. [place] his primary cardiologist.

## 2014-10-03 NOTE — Addendum Note (Signed)
Addended by: Tonita Phoenix on: 10/03/2014 10:05 AM   Modules accepted: Orders

## 2014-10-03 NOTE — Addendum Note (Signed)
Addended by: BOWDEN, ROBIN K on: 10/03/2014 10:37 AM   Modules accepted: Orders  

## 2014-10-03 NOTE — Addendum Note (Signed)
Addended by: Tonita Phoenix on: 10/03/2014 10:37 AM   Modules accepted: Orders

## 2014-10-03 NOTE — Addendum Note (Signed)
Addended by: Tonita Phoenix on: 10/03/2014 10:06 AM   Modules accepted: Orders

## 2014-10-03 NOTE — Telephone Encounter (Signed)
Spoke with pt dtr, this SOB has been going on for about one week now. The dtr reports this is the same type of symptoms he had with previous pneumonia. He is not coughing anything up. He has not taken the extra lasix today. Advised patient to take the extra 40 mg of lasix now. Will follow up with patient tomorrow regarding appt with dr Sw[place]iland.

## 2014-10-04 ENCOUNTER — Telehealth: Payer: Self-pay | Admitting: Internal Medicine

## 2014-10-04 NOTE — Telephone Encounter (Signed)
Spoke to patient's daughter Vikki Ports she stated father is feeling better today.Sob better.Appointment scheduled with Dr.Jordan 11/01/14 at 4:30 pm.Advised to call sooner if needed.

## 2014-10-04 NOTE — Telephone Encounter (Signed)
See previous 10/04/14 note.Appointment scheduled with Dr.Jordan 11/01/14 at 4:30 pm.

## 2014-10-04 NOTE — Telephone Encounter (Signed)
New problem   Pt's daughter calling to get her dad's lab results. Please advise

## 2014-10-04 NOTE — Telephone Encounter (Signed)
Returned call to patient's daughter she stated father had lab work done 10/03/14 ordered by [employer].Advised Dr.Taylor has not reviewed lab will send message to Dr.Taylor's nurse.

## 2014-10-05 NOTE — Telephone Encounter (Signed)
Lendon Ka, RN at 10/03/2014 11:02 AM     Status: Signed       Expand All Collapse All   Patient walked in clinic with daughter present. He had appointment for blood work today.  BNP and BMET were collected.  Pt requested to talk to nurse regarding swelling and SOB. On 6/1 visit Dr. Ladona Ridgel (seen for ICD) increased lasix to 60 mg daily.  Pt has no peripheral edema, he states that resolved with the increased lasix; however his face remains swollen. (he denies starting any new meds and reports the facial swelling comes and goes with the peripheral edema)  His SOB has not improved with higher dose of lasix. He sleeps on pillows or sitting up in chair.  His lungs are clear to auscultation.   Discussed with Dr. Ladona Ridgel, who rec increase lasix to 40 mg twice daily and follow soon with Dr. [place]iland his primary cardiologist.       An apt is scheduled for 7/14 with Dr Swaziland

## 2014-10-05 NOTE — Telephone Encounter (Signed)
As patient is feeling better will continue with plan and may need to move Dr Elvis Coil appointment up if needed.  His labs are in Dr Elvis Coil box but Dr Ladona Ridgel did see and advised to continue current plan.  If he worsens then will need to move Dr Sw[place]iland appointment up.  Daughter aware

## 2014-10-14 ENCOUNTER — Other Ambulatory Visit: Payer: Self-pay | Admitting: Internal Medicine

## 2014-10-17 ENCOUNTER — Ambulatory Visit: Payer: Medicare Other | Admitting: *Deleted

## 2014-10-17 DIAGNOSIS — M79673 Pain in unspecified foot: Secondary | ICD-10-CM

## 2014-10-17 NOTE — Progress Notes (Signed)
Patient ID: Jacob Pittman, male   DOB: 1946-02-13, 69 y.o.   MRN: [id number] Patient is scanned and measured for diabetic shoes

## 2014-11-01 ENCOUNTER — Ambulatory Visit (INDEPENDENT_AMBULATORY_CARE_PROVIDER_SITE_OTHER): Payer: Medicare Other | Admitting: Cardiology

## 2014-11-01 ENCOUNTER — Encounter: Payer: Self-pay | Admitting: Cardiology

## 2014-11-01 VITALS — BP 120/72 | HR 72 | Ht 73.0 in | Wt 217.9 lb

## 2014-11-01 DIAGNOSIS — I428 Other cardiomyopathies: Secondary | ICD-10-CM

## 2014-11-01 DIAGNOSIS — E785 Hyperlipidemia, unspecified: Secondary | ICD-10-CM

## 2014-11-01 DIAGNOSIS — I10 Essential (primary) hypertension: Secondary | ICD-10-CM

## 2014-11-01 DIAGNOSIS — I429 Cardiomyopathy, unspecified: Secondary | ICD-10-CM

## 2014-11-01 DIAGNOSIS — I5022 Chronic systolic (congestive) heart failure: Secondary | ICD-10-CM | POA: Diagnosis not present

## 2014-11-01 DIAGNOSIS — R609 Edema, unspecified: Secondary | ICD-10-CM

## 2014-11-01 MED ORDER — FUROSEMIDE 40 MG PO TABS: 40.0000 mg | ORAL_TABLET | Freq: Two times a day (BID) | ORAL | Status: DC

## 2014-11-01 NOTE — Progress Notes (Signed)
Jacob Pittman Date of Birth: [birth year] Medical Record #[id number]  History of Present Illness: [name] is seen for followup of his congestive heart failure. 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 EF of 15% despite optimal medical therapy. He is s/p ICD placement in December 2014. He also has a history of PAD. He was seen by Dr. Allyson Sabal but wasn't having symptoms so conservative therapy was recommended. When seen by Dr. Ladona Ridgel one month ago he was noted to be volume overloaded. His BNP was elevated. Lasix was increased to 40 mg bid. Since then he has noted a marked improvement. He has lost 12 lbs. Edema has resolved. No SOB. He is watching his salt intake.   He remains abstinent from alcohol.   Current Outpatient Prescriptions on File Prior to Visit  Medication Sig Dispense Refill  . albuterol (PROVENTIL HFA;VENTOLIN HFA) 108 (90 BASE) MCG/ACT inhaler Inhale 2 puffs into the lungs every 6 (six) hours as needed for wheezing or shortness of breath. 1 Inhaler 5  . aspirin 81 MG EC tablet Take 81 mg by mouth daily.      . carvedilol (COREG) 12.5 MG tablet TAKE 1 TABLET (12.5 MG TOTAL) BY MOUTH 2 (TWO) TIMES DAILY. 60 tablet 11  . carvedilol (COREG) 12.5 MG tablet TAKE 1 TABLET (12.5 MG TOTAL) BY MOUTH 2 (TWO) TIMES DAILY. 60 tablet 0  . cephALEXin (KEFLEX) 500 MG capsule Take 1 capsule (500 mg total) by mouth 4 (four) times daily. 40 capsule 0  . cetirizine (ZYRTEC) 10 MG tablet Take 1 tablet (10 mg total) by mouth daily. 30 tablet 11  . fluticasone (FLONASE) 50 MCG/ACT nasal spray Place 2 sprays into both nostrils daily. 16 g 2  . lisinopril (PRINIVIL,ZESTRIL) 20 MG tablet Take 1 tablet (20 mg total) by mouth daily. 90 tablet 3  . lovastatin (MEVACOR) 20 MG tablet Take 1 tablet (20 mg total) by mouth at bedtime. 90 tablet 3  . metFORMIN (GLUCOPHAGE XR) 500 MG 24 hr tablet 2 tabs by mouth in the AM 180 tablet 3  . spironolactone  (ALDACTONE) 25 MG tablet Take 1 tablet (25 mg total) by mouth daily. 90 tablet 3   No current facility-administered medications on file prior to visit.    No Known Allergies  Past Medical History  Diagnosis Date  . DIABETES MELLITUS, TYPE II 11/07/2008  . HYPERLIPIDEMIA 11/07/2008  . ANXIETY 02/04/2009  . HYPERTENSION 10/08/2008  . 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 OSTEOARTHRITIS, KNEE, RIGHT 10/08/2008  . HIP PAIN, RIGHT 11/06/2009  . Chronic systolic CHF (congestive heart failure)   . Nonischemic cardiomyopathy     s/p ICD implantation 03-2013 by Dr Ladona Ridgel  . Peripheral arterial disease   . CHF (congestive heart failure)     Past Surgical History  Procedure Laterality Date  . S/p left broken arm with pin      1980's  . Cardiac defibrillator placement Left 04/10/13    BTK single chamber ICD implanted by Dr Ladona Ridgel for primary prevention  . Implantable cardioverter defibrillator implant N/A 04/10/2013    Procedure: IMPLANTABLE CARDIOVERTER DEFIBRILLATOR IMPLANT;  Surgeon: Marinus Maw, MD;  Location: [facility] CATH LAB;  Service: Cardiovascular;  Laterality: N/A;    History  Smoking status  . Former Smoker  Smokeless tobacco  . Never Used    History  Alcohol Use No    Family History  Problem Relation Age of Onset  . Diabetes Mother   . Hypertension  Mother     Review of Systems: As noted in history of present illness.  All other systems were reviewed and are negative.  Physical Exam: BP 120/72 mmHg  Pulse 72  Ht 6\' 1"  (1.854 m)  Wt 98.839 kg (217 lb 14.4 oz)  BMI 28.75 kg/m2 He is a very pleasant, obese black male in no acute distress. HEENT: Normal. Neck is supple without bruits, adenopathy, or thyromegaly. No JVD. Lungs: Clear Cardiovascular: Regular rate and rhythm. Normal S1 and S2. No gallop, murmur, or click. ICD site has healed well. Abdomen: Obese, soft, nontender. Bowel sounds are positive. No bladder splenomegaly. Extremities: No cyanosis. No  edema. Pedal pulses are 2+. Skin: Warm and dry Neuro: Alert and oriented x3. Cranial nerves II through XII are intact. No focal findings.  LABORATORY DATA: Lab Results  Component Value Date   WBC 5.4 05/23/2014   HGB 13.6 05/23/2014   HCT 40.1 05/23/2014   PLT 222.0 05/23/2014   GLUCOSE 227* 10/03/2014   CHOL 151 05/23/2014   TRIG 135.0 05/23/2014   HDL 28.80* 05/23/2014   LDLDIRECT 141.6 05/12/2013   LDLCALC 95 05/23/2014   ALT 24 05/23/2014   AST 20 05/23/2014   NA 134* 10/03/2014   K 4.3 10/03/2014   CL 99 10/03/2014   CREATININE 1.18 10/03/2014   BUN 24* 10/03/2014   CO2 28 10/03/2014   TSH 2.13 05/23/2014   PSA 14.70* 05/23/2014   INR 1.1* 04/03/2013   HGBA1C 7.1* 05/23/2014   MICROALBUR 28.6* 05/23/2014    Echo 05/25/14:Study Conclusions  - Left ventricle: The cavity size was mildly dilated. Wall thickness was normal. The estimated ejection fraction was 15%. Diffuse hypokinesis. Features are consistent with a pseudonormal left ventricular filling pattern, with concomitant abnormal relaxation and increased filling pressure (grade 2 diastolic dysfunction). - Aortic valve: There was no stenosis. There was trivial regurgitation. - Mitral valve: Mildly calcified annulus. There was mild regurgitation. - Left atrium: The atrium was severely dilated. - Right ventricle: The cavity size was normal. Pacer wire or catheter noted in right ventricle. Systolic function was moderately to severely reduced. - Right atrium: The atrium was moderately dilated. - Tricuspid valve: Peak RV-RA gradient (S): 39 mm Hg. - Pulmonary arteries: PA peak pressure: 54 mm Hg (S). - Systemic veins: IVC measured 3.0 cm with < 50% respirophasic variation, suggesting RA pressure 15 mmHg. - Pericardium, extracardiac: A trivial pericardial effusion was identified.  Impressions:  - Mildly dilated LV with severe global hypokinesis, EF 15%. Moderate diastolic dysfunction.  Normal RV size with moderate to severe systolic dysfunction. Mild MR. Moderate pulmonary hypertension. Dilated IVC suggestive of elevated RV filling pressure.  Assessment / Plan: 1. Chronic systolic congestive heart failure. Ejection fraction 15% despite optimal medical therapy. This is probably  related to chronic moonshine abuse. No significant ischemia or infarction noted on Myoview study. Patient is no longer drinking alcohol. He is on appropriate therapy with lisinopril, carvedilol, Aldactone, and Lasix. Weight is down 12 lbs with recent increase in lasix dose. Will continue lasix 40 mg bid. We will continue sodium restriction 2 g daily. Will repeat BMET and BNP level. I will follow up in 3 months.  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 Hypertension-controlled  4. DM type 2.  5. S/p prophylactic ICD. Follow up in device clinic  6. PAD- remains asymptomatic. Has regular follow up with podiatry.

## 2014-11-01 NOTE — Patient Instructions (Signed)
Continue your current therapy  Stay away from salt.  We will check blood work  Monitor your weight and let us know if you have more swelling or weight gain.  I will see you in 3 months.

## 2014-11-05 DIAGNOSIS — I10 Essential (primary) hypertension: Secondary | ICD-10-CM | POA: Diagnosis not present

## 2014-11-05 DIAGNOSIS — E785 Hyperlipidemia, unspecified: Secondary | ICD-10-CM | POA: Diagnosis not present

## 2014-11-05 DIAGNOSIS — R609 Edema, unspecified: Secondary | ICD-10-CM | POA: Diagnosis not present

## 2014-11-05 DIAGNOSIS — I428 Other cardiomyopathies: Secondary | ICD-10-CM | POA: Diagnosis not present

## 2014-11-05 DIAGNOSIS — I5022 Chronic systolic (congestive) heart failure: Secondary | ICD-10-CM | POA: Diagnosis not present

## 2014-11-05 DIAGNOSIS — I429 Cardiomyopathy, unspecified: Secondary | ICD-10-CM | POA: Diagnosis not present

## 2014-11-06 LAB — BASIC METABOLIC PANEL
BUN: 16 mg/dL (ref 6–23)
CO2: 31 mEq/L (ref 19–32)
CREATININE: 0.81 mg/dL (ref 0.50–1.35)
Calcium: 9.2 mg/dL (ref 8.4–10.5)
Chloride: 101 mEq/L (ref 96–112)
GLUCOSE: 152 mg/dL — AB (ref 70–99)
Potassium: 3.7 mEq/L (ref 3.5–5.3)
SODIUM: 142 meq/L (ref 135–145)

## 2014-11-06 LAB — BRAIN NATRIURETIC PEPTIDE: BRAIN NATRIURETIC PEPTIDE: 1582.1 pg/mL — AB (ref 0.0–100.0)

## 2014-11-14 ENCOUNTER — Ambulatory Visit (INDEPENDENT_AMBULATORY_CARE_PROVIDER_SITE_OTHER): Payer: Medicare Other | Admitting: Podiatry

## 2014-11-14 ENCOUNTER — Encounter: Payer: Self-pay | Admitting: Podiatry

## 2014-11-14 VITALS — BP 138/51 | HR 52 | Resp 15

## 2014-11-14 DIAGNOSIS — M2011 Hallux valgus (acquired), right foot: Secondary | ICD-10-CM

## 2014-11-14 DIAGNOSIS — E1151 Type 2 diabetes mellitus with diabetic peripheral angiopathy without gangrene: Secondary | ICD-10-CM

## 2014-11-14 DIAGNOSIS — E1159 Type 2 diabetes mellitus with other circulatory complications: Secondary | ICD-10-CM | POA: Diagnosis not present

## 2014-11-14 DIAGNOSIS — E1040 Type 1 diabetes mellitus with diabetic neuropathy, unspecified: Secondary | ICD-10-CM | POA: Diagnosis not present

## 2014-11-14 DIAGNOSIS — M2012 Hallux valgus (acquired), left foot: Secondary | ICD-10-CM | POA: Diagnosis not present

## 2014-11-14 DIAGNOSIS — R0989 Other specified symptoms and signs involving the circulatory and respiratory systems: Secondary | ICD-10-CM

## 2014-11-14 NOTE — Progress Notes (Signed)
Patient ID: Jacob Pittman, male   DOB: Apr 22, 1945, 69 y.o.   MRN: [id number] Patient presents for diabetic shoe pick up, shoes are tried on for good fit.  Patient received 1 Pair Hushpuppies Gil Velco black in Mens 11.5 medium and 3 pairs custom molded diabetic inserts.  Verbal and written break in and wear instructions given.  Patient will follow up for scheduled routine care.

## 2014-11-14 NOTE — Patient Instructions (Signed)

## 2014-11-21 ENCOUNTER — Ambulatory Visit: Payer: Medicare Other | Admitting: Internal Medicine

## 2014-11-21 DIAGNOSIS — Z0289 Encounter for other administrative examinations: Secondary | ICD-10-CM

## 2014-11-27 ENCOUNTER — Encounter: Payer: Self-pay | Admitting: Internal Medicine

## 2014-12-03 ENCOUNTER — Ambulatory Visit: Payer: Medicare Other | Admitting: Podiatry

## 2014-12-11 ENCOUNTER — Ambulatory Visit (INDEPENDENT_AMBULATORY_CARE_PROVIDER_SITE_OTHER): Payer: Medicare Other | Admitting: Podiatry

## 2014-12-11 ENCOUNTER — Encounter: Payer: Self-pay | Admitting: Podiatry

## 2014-12-11 DIAGNOSIS — B351 Tinea unguium: Secondary | ICD-10-CM

## 2014-12-11 DIAGNOSIS — M79676 Pain in unspecified toe(s): Secondary | ICD-10-CM | POA: Diagnosis not present

## 2014-12-11 NOTE — Patient Instructions (Signed)
Diabetes and Foot Care Diabetes may cause you to have problems because of poor blood supply (circulation) to your feet and legs. This may cause the skin on your feet to become thinner, break easier, and heal more slowly. Your skin may become dry, and the skin may peel and crack. You may also have nerve damage in your legs and feet causing decreased feeling in them. You may not notice minor injuries to your feet that could lead to infections or more serious problems. Taking care of your feet is one of the most important things you can do for yourself.  HOME CARE INSTRUCTIONS  Wear shoes at all times, even in the house. Do not go barefoot. Bare feet are easily injured.  Check your feet daily for blisters, cuts, and redness. If you cannot see the bottom of your feet, use a mirror or ask someone for help.  Wash your feet with warm water (do not use hot water) and mild soap. Then pat your feet and the areas between your toes until they are completely dry. Do not soak your feet as this can dry your skin.  Apply a moisturizing lotion or petroleum jelly (that does not contain alcohol and is unscented) to the skin on your feet and to dry, brittle toenails. Do not apply lotion between your toes.  Trim your toenails straight across. Do not dig under them or around the cuticle. File the edges of your nails with an emery board or nail file.  Do not cut corns or calluses or try to remove them with medicine.  Wear clean socks or stockings every day. Make sure they are not too tight. Do not wear knee-high stockings since they may decrease blood flow to your legs.  Wear shoes that fit properly and have enough cushioning. To break in new shoes, wear them for just a few hours a day. This prevents you from injuring your feet. Always look in your shoes before you put them on to be sure there are no objects inside.  Do not cross your legs. This may decrease the blood flow to your feet.  If you find a minor scrape,  cut, or break in the skin on your feet, keep it and the skin around it clean and dry. These areas may be cleansed with mild soap and water. Do not cleanse the area with peroxide, alcohol, or iodine.  When you remove an adhesive bandage, be sure not to damage the skin around it.  If you have a wound, look at it several times a day to make sure it is healing.  Do not use heating pads or hot water bottles. They may burn your skin. If you have lost feeling in your feet or legs, you may not know it is happening until it is too late.  Make sure your health care provider performs a complete foot exam at least annually or more often if you have foot problems. Report any cuts, sores, or bruises to your health care provider immediately. SEEK MEDICAL CARE IF:   You have an injury that is not healing.  You have cuts or breaks in the skin.  You have an ingrown nail.  You notice redness on your legs or feet.  You feel burning or tingling in your legs or feet.  You have pain or cramps in your legs and feet.  Your legs or feet are numb.  Your feet always feel cold. SEEK IMMEDIATE MEDICAL CARE IF:   There is increasing redness,   swelling, or pain in or around a wound.  There is a red line that goes up your leg.  Pus is coming from a wound.  You develop a fever or as directed by your health care provider.  You notice a bad smell coming from an ulcer or wound. Document Released: 04/03/2000 Document Revised: 12/07/2012 Document Reviewed: 09/13/2012 ExitCare Patient Information 2015 [employer]. This information is not intended to replace advice given to you by your health care provider. Make sure you discuss any questions you have with your health care provider.  

## 2014-12-11 NOTE — Progress Notes (Signed)
Patient ID: Jacob Pittman, male   DOB: [birth year], 69 y.o.   MRN: [id number]  Subjective: This patient presents for a scheduled visit complaining of painful toenails and requesting nail debridement  Objective: The toenails are brittle, elongated, discolored, hypertrophic and tender to direct palpation 6-10  Assessment: Symptomatic onychomycoses 6-10 History of peripheral arterial disease that's been evaluated by Dr. Allyson Sabal Type II diabetic  Plan: Debridement toenails 10 mechanically and electrically without any bleeding  Reappoint 3 months

## 2014-12-12 ENCOUNTER — Telehealth: Payer: Self-pay | Admitting: Cardiology

## 2014-12-12 NOTE — Telephone Encounter (Signed)
Can increase lasix to 80 mg in am and 40 mg in pm for 3-4 days then 40 mg bid. Needs to restrict sodium intake. If symptoms do not improve let us know.  Jamyron Redd [place] MD, [facility]

## 2014-12-12 NOTE — Telephone Encounter (Signed)
Please call,hand is swollen and short of breath. Please call to advise,pt is raking his fluid medicine.

## 2014-12-12 NOTE — Telephone Encounter (Signed)
Instructed patient to increase his lasix to 80 mg in the morning (two 40 mg tablets) and one 40 mg tablet for 3-4 days  After three to four days return to 40 mg twice a day  If symptoms do not improve to call us back

## 2014-12-12 NOTE — Addendum Note (Signed)
Addended by: Lake Bells on: 12/12/2014 02:25 PM   Modules accepted: Medications

## 2014-12-12 NOTE — Telephone Encounter (Signed)
2 days of feeling short of breath  Able to talk in full sentence without sounding short of breath  Weight last OV 10/31/13 217 lb Weight at home yesterday 12/11/14 229  Blood pressure 133/82 8/22  C/o left hand around fingers being swollen and full.  No redness or warmth

## 2014-12-19 ENCOUNTER — Ambulatory Visit (INDEPENDENT_AMBULATORY_CARE_PROVIDER_SITE_OTHER): Payer: Medicare Other | Admitting: *Deleted

## 2014-12-19 DIAGNOSIS — I429 Cardiomyopathy, unspecified: Secondary | ICD-10-CM | POA: Diagnosis not present

## 2014-12-19 DIAGNOSIS — I428 Other cardiomyopathies: Secondary | ICD-10-CM

## 2014-12-19 NOTE — Progress Notes (Signed)
Remote ICD transmission.   

## 2014-12-25 ENCOUNTER — Encounter: Payer: Self-pay | Admitting: Cardiology

## 2014-12-25 ENCOUNTER — Ambulatory Visit (INDEPENDENT_AMBULATORY_CARE_PROVIDER_SITE_OTHER): Payer: Medicare Other | Admitting: Cardiology

## 2014-12-25 VITALS — BP 128/86 | HR 76 | Ht 73.0 in | Wt 234.7 lb

## 2014-12-25 DIAGNOSIS — I429 Cardiomyopathy, unspecified: Secondary | ICD-10-CM

## 2014-12-25 DIAGNOSIS — E785 Hyperlipidemia, unspecified: Secondary | ICD-10-CM | POA: Diagnosis not present

## 2014-12-25 DIAGNOSIS — Z95810 Presence of automatic (implantable) cardiac defibrillator: Secondary | ICD-10-CM

## 2014-12-25 DIAGNOSIS — I5023 Acute on chronic systolic (congestive) heart failure: Secondary | ICD-10-CM | POA: Diagnosis not present

## 2014-12-25 DIAGNOSIS — I428 Other cardiomyopathies: Secondary | ICD-10-CM

## 2014-12-25 DIAGNOSIS — I10 Essential (primary) hypertension: Secondary | ICD-10-CM | POA: Diagnosis not present

## 2014-12-25 MED ORDER — METOLAZONE 2.5 MG PO TABS: 2.5000 mg | ORAL_TABLET | Freq: Every day | ORAL | Status: DC

## 2014-12-25 NOTE — Progress Notes (Signed)
Jacob Pittman Date of Birth: 1945/07/09 Medical Record #[id number]  History of Present Illness: Jacob Pittman is seen for followup of his congestive heart failure. 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 EF of 15% despite optimal medical therapy. He is s/p ICD placement in December 2014. He also has a history of PAD. He was seen by Dr. Allyson Sabal but wasn't having symptoms so conservative therapy was recommended. When seen by Dr. Ladona Ridgel in June and  he was noted to be volume overloaded. His BNP was elevated. Lasix was increased to 40 mg bid. Initially he had a great response with 12 lb weight loss and improvement in his symptoms. Since then he notes worsening edema, weight gain, and SOB. Denies any change in diet. Lasix increased to 80 mg in am and 40 mg in pm without improvement. Notes blistering and weeping of his legs. Weight is up 17 lbs.   Current Outpatient Prescriptions on File Prior to Visit  Medication Sig Dispense Refill  . albuterol (PROVENTIL HFA;VENTOLIN HFA) 108 (90 BASE) MCG/ACT inhaler Inhale 2 puffs into the lungs every 6 (six) hours as needed for wheezing or shortness of breath. 1 Inhaler 5  . aspirin 81 MG EC tablet Take 81 mg by mouth daily.      . carvedilol (COREG) 12.5 MG tablet TAKE 1 TABLET (12.5 MG TOTAL) BY MOUTH 2 (TWO) TIMES DAILY. 60 tablet 11  . cetirizine (ZYRTEC) 10 MG tablet Take 1 tablet (10 mg total) by mouth daily. 30 tablet 11  . fluticasone (FLONASE) 50 MCG/ACT nasal spray Place 2 sprays into both nostrils daily. 16 g 2  . furosemide (LASIX) 40 MG tablet Take 1 tablet (40 mg total) by mouth 2 (two) times daily. (Patient taking differently: Take 40 mg by mouth 2 (two) times daily. ) 180 tablet 3  . lisinopril (PRINIVIL,ZESTRIL) 20 MG tablet Take 1 tablet (20 mg total) by mouth daily. 90 tablet 3  . lovastatin (MEVACOR) 20 MG tablet Take 1 tablet (20 mg total) by mouth at bedtime. 90 tablet 3  . metFORMIN  (GLUCOPHAGE XR) 500 MG 24 hr tablet 2 tabs by mouth in the AM 180 tablet 3  . spironolactone (ALDACTONE) 25 MG tablet Take 1 tablet (25 mg total) by mouth daily. 90 tablet 3   No current facility-administered medications on file prior to visit.    No Known Allergies  Past Medical History  Diagnosis Date  . DIABETES MELLITUS, TYPE II 11/07/2008  . HYPERLIPIDEMIA 11/07/2008  . ANXIETY 02/04/2009  . HYPERTENSION 10/08/2008  . 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 OSTEOARTHRITIS, KNEE, RIGHT 10/08/2008  . HIP PAIN, RIGHT 11/06/2009  . Chronic systolic CHF (congestive heart failure)   . Nonischemic cardiomyopathy     s/p ICD implantation 03-2013 by Dr Ladona Ridgel  . Peripheral arterial disease   . CHF (congestive heart failure)     Past Surgical History  Procedure Laterality Date  . S/p left broken arm with pin      1980's  . Cardiac defibrillator placement Left 04/10/13    BTK single chamber ICD implanted by Dr Ladona Ridgel for primary prevention  . Implantable cardioverter defibrillator implant N/A 04/10/2013    Procedure: IMPLANTABLE CARDIOVERTER DEFIBRILLATOR IMPLANT;  Surgeon: Marinus Maw, MD;  Location: [facility] CATH LAB;  Service: Cardiovascular;  Laterality: N/A;    History  Smoking status  . Former Smoker  Smokeless tobacco  . Never Used    History  Alcohol Use No  Family History  Problem Relation Age of Onset  . Diabetes Mother   . Hypertension Mother     Review of Systems: As noted in history of present illness.  All other systems were reviewed and are negative.  Physical Exam: BP 128/86 mmHg  Pulse 76  Ht 6\' 1"  (1.854 m)  Wt 106.459 kg (234 lb 11.2 oz)  BMI 30.97 kg/m2 He is a very pleasant, obese black male in no acute distress. HEENT: Normal. Neck is supple without bruits, adenopathy, or thyromegaly. No JVD. Lungs: bibasilar rales Cardiovascular: Regular rate and rhythm. Normal S1 and S2. No gallop, murmur, or click. ICD site has healed well. Abdomen:  Obese, soft, nontender. Bowel sounds are positive. No bladder splenomegaly. Extremities: No cyanosis. 2+ edema with diffuse weeping. Pedal pulses are 2+. Skin breakdown left shin from abrasion. Skin: Warm and dry Neuro: Alert and oriented x3. Cranial nerves II through XII are intact. No focal findings.  LABORATORY DATA: Lab Results  Component Value Date   WBC 5.4 05/23/2014   HGB 13.6 05/23/2014   HCT 40.1 05/23/2014   PLT 222.0 05/23/2014   GLUCOSE 152* 11/05/2014   CHOL 151 05/23/2014   TRIG 135.0 05/23/2014   HDL 28.80* 05/23/2014   LDLDIRECT 141.6 05/12/2013   LDLCALC 95 05/23/2014   ALT 24 05/23/2014   AST 20 05/23/2014   NA 142 11/05/2014   K 3.7 11/05/2014   CL 101 11/05/2014   CREATININE 0.81 11/05/2014   BUN 16 11/05/2014   CO2 31 11/05/2014   TSH 2.13 05/23/2014   PSA 14.70* 05/23/2014   INR 1.1* 04/03/2013   HGBA1C 7.1* 05/23/2014   MICROALBUR 28.6* 05/23/2014    Echo 05/25/14:Study Conclusions  - Left ventricle: The cavity size was mildly dilated. Wall thickness was normal. The estimated ejection fraction was 15%. Diffuse hypokinesis. Features are consistent with a pseudonormal left ventricular filling pattern, with concomitant abnormal relaxation and increased filling pressure (grade 2 diastolic dysfunction). - Aortic valve: There was no stenosis. There was trivial regurgitation. - Mitral valve: Mildly calcified annulus. There was mild regurgitation. - Left atrium: The atrium was severely dilated. - Right ventricle: The cavity size was normal. Pacer wire or catheter noted in right ventricle. Systolic function was moderately to severely reduced. - Right atrium: The atrium was moderately dilated. - Tricuspid valve: Peak RV-RA gradient (S): 39 mm Hg. - Pulmonary arteries: PA peak pressure: 54 mm Hg (S). - Systemic veins: IVC measured 3.0 cm with < 50% respirophasic variation, suggesting RA pressure 15 mmHg. - Pericardium, extracardiac:  A trivial pericardial effusion was identified.  Impressions:  - Mildly dilated LV with severe global hypokinesis, EF 15%. Moderate diastolic dysfunction. Normal RV size with moderate to severe systolic dysfunction. Mild MR. Moderate pulmonary hypertension. Dilated IVC suggestive of elevated RV filling pressure.  Assessment / Plan: 1. Acute on Chronic systolic congestive heart failure. Ejection fraction 15% despite optimal medical therapy. This is probably  related to chronic moonshine abuse. No significant ischemia or infarction noted on Myoview study. Patient is no longer drinking alcohol. He is on appropriate therapy with lisinopril, carvedilol, Aldactone, and Lasix. Now weight is back up 17 lbs. Will continue lasix 40 mg bid. We will continue sodium restriction 2 g daily. Will add Metolazone 2.5 mg daily for 3 days then every other day. Repeat BMET and BNP in one week. Arrange follow up in 2 weeks. May need to consider referral to CHF clinic if he is resistant.   2. Alcohol abuse-now in  recovery  3. Hypertension-controlled  4. DM type 2.  5. S/p prophylactic ICD. Follow up in device clinic  6. PAD- remains asymptomatic. Has regular follow up with podiatry.

## 2014-12-25 NOTE — Patient Instructions (Signed)
Continue your current therapy  We are going to add metolazone 2.5 mg daily for 3 days then only every other day.  We will check blood work in one week and arrange follow up in 2 weeks.

## 2014-12-28 LAB — CUP PACEART REMOTE DEVICE CHECK
HIGH POWER IMPEDANCE MEASURED VALUE: 53 Ohm
Lead Channel Impedance Value: 450 Ohm
Lead Channel Pacing Threshold Amplitude: 0.6 V
Lead Channel Sensing Intrinsic Amplitude: 1.1 mV
Lead Channel Sensing Intrinsic Amplitude: 15.3 mV
Lead Channel Setting Sensing Sensitivity: 0.8 mV
MDC IDC MSMT LEADCHNL RV PACING THRESHOLD PULSEWIDTH: 0.4 ms
MDC IDC SESS DTM: 20160909075453
MDC IDC SET LEADCHNL RV PACING AMPLITUDE: 2 V
MDC IDC SET LEADCHNL RV PACING PULSEWIDTH: 0.4 ms
MDC IDC STAT BRADY RV PERCENT PACED: 14 %
Pulse Gen Model: 383594
Pulse Gen Serial Number: 60733905

## 2015-01-01 ENCOUNTER — Ambulatory Visit: Payer: Medicare Other | Admitting: Internal Medicine

## 2015-01-01 ENCOUNTER — Other Ambulatory Visit: Payer: Self-pay | Admitting: Internal Medicine

## 2015-01-01 DIAGNOSIS — E785 Hyperlipidemia, unspecified: Secondary | ICD-10-CM | POA: Diagnosis not present

## 2015-01-01 DIAGNOSIS — I429 Cardiomyopathy, unspecified: Secondary | ICD-10-CM | POA: Diagnosis not present

## 2015-01-01 DIAGNOSIS — I10 Essential (primary) hypertension: Secondary | ICD-10-CM | POA: Diagnosis not present

## 2015-01-01 DIAGNOSIS — Z95810 Presence of automatic (implantable) cardiac defibrillator: Secondary | ICD-10-CM | POA: Diagnosis not present

## 2015-01-01 DIAGNOSIS — I5023 Acute on chronic systolic (congestive) heart failure: Secondary | ICD-10-CM | POA: Diagnosis not present

## 2015-01-01 DIAGNOSIS — I509 Heart failure, unspecified: Secondary | ICD-10-CM | POA: Diagnosis not present

## 2015-01-01 DIAGNOSIS — I428 Other cardiomyopathies: Secondary | ICD-10-CM | POA: Diagnosis not present

## 2015-01-02 ENCOUNTER — Encounter: Payer: Self-pay | Admitting: Cardiology

## 2015-01-02 LAB — BRAIN NATRIURETIC PEPTIDE: Brain Natriuretic Peptide: 3094.2 pg/mL — ABNORMAL HIGH (ref 0.0–100.0)

## 2015-01-02 LAB — BASIC METABOLIC PANEL
BUN: 28 mg/dL — ABNORMAL HIGH (ref 7–25)
CHLORIDE: 97 mmol/L — AB (ref 98–110)
CO2: 27 mmol/L (ref 20–31)
Calcium: 9.2 mg/dL (ref 8.6–10.3)
Creat: 1.25 mg/dL — ABNORMAL HIGH (ref 0.70–1.18)
Glucose, Bld: 191 mg/dL — ABNORMAL HIGH (ref 65–99)
POTASSIUM: 4.6 mmol/L (ref 3.5–5.3)
SODIUM: 137 mmol/L (ref 135–146)

## 2015-01-03 ENCOUNTER — Emergency Department (HOSPITAL_BASED_OUTPATIENT_CLINIC_OR_DEPARTMENT_OTHER): Payer: Medicare Other

## 2015-01-03 ENCOUNTER — Emergency Department (HOSPITAL_BASED_OUTPATIENT_CLINIC_OR_DEPARTMENT_OTHER)
Admission: EM | Admit: 2015-01-03 | Discharge: 2015-01-03 | Disposition: A | Discharge status: Home / Self Care | Payer: Medicare Other | Attending: Emergency Medicine | Admitting: Emergency Medicine

## 2015-01-03 ENCOUNTER — Ambulatory Visit (INDEPENDENT_AMBULATORY_CARE_PROVIDER_SITE_OTHER): Payer: Medicare Other | Admitting: Internal Medicine

## 2015-01-03 ENCOUNTER — Encounter: Payer: Self-pay | Admitting: Internal Medicine

## 2015-01-03 ENCOUNTER — Encounter (HOSPITAL_BASED_OUTPATIENT_CLINIC_OR_DEPARTMENT_OTHER): Payer: Self-pay | Admitting: Emergency Medicine

## 2015-01-03 ENCOUNTER — Other Ambulatory Visit: Payer: Self-pay

## 2015-01-03 VITALS — BP 118/82 | HR 95 | Temp 97.7°F | Wt 229.5 lb

## 2015-01-03 DIAGNOSIS — Z87438 Personal history of other diseases of male genital organs: Secondary | ICD-10-CM | POA: Insufficient documentation

## 2015-01-03 DIAGNOSIS — Z87891 Personal history of nicotine dependence: Secondary | ICD-10-CM | POA: Diagnosis not present

## 2015-01-03 DIAGNOSIS — R609 Edema, unspecified: Secondary | ICD-10-CM

## 2015-01-03 DIAGNOSIS — Z7951 Long term (current) use of inhaled steroids: Secondary | ICD-10-CM | POA: Diagnosis not present

## 2015-01-03 DIAGNOSIS — I5022 Chronic systolic (congestive) heart failure: Secondary | ICD-10-CM | POA: Diagnosis not present

## 2015-01-03 DIAGNOSIS — E119 Type 2 diabetes mellitus without complications: Secondary | ICD-10-CM | POA: Diagnosis not present

## 2015-01-03 DIAGNOSIS — M7989 Other specified soft tissue disorders: Secondary | ICD-10-CM | POA: Diagnosis not present

## 2015-01-03 DIAGNOSIS — R002 Palpitations: Secondary | ICD-10-CM | POA: Insufficient documentation

## 2015-01-03 DIAGNOSIS — Z8659 Personal history of other mental and behavioral disorders: Secondary | ICD-10-CM | POA: Insufficient documentation

## 2015-01-03 DIAGNOSIS — I509 Heart failure, unspecified: Secondary | ICD-10-CM

## 2015-01-03 DIAGNOSIS — Z79899 Other long term (current) drug therapy: Secondary | ICD-10-CM | POA: Diagnosis not present

## 2015-01-03 DIAGNOSIS — Z7982 Long term (current) use of aspirin: Secondary | ICD-10-CM | POA: Insufficient documentation

## 2015-01-03 DIAGNOSIS — R0600 Dyspnea, unspecified: Secondary | ICD-10-CM | POA: Diagnosis not present

## 2015-01-03 DIAGNOSIS — E785 Hyperlipidemia, unspecified: Secondary | ICD-10-CM | POA: Diagnosis not present

## 2015-01-03 DIAGNOSIS — R0602 Shortness of breath: Secondary | ICD-10-CM | POA: Insufficient documentation

## 2015-01-03 DIAGNOSIS — M1990 Unspecified osteoarthritis, unspecified site: Secondary | ICD-10-CM | POA: Diagnosis not present

## 2015-01-03 DIAGNOSIS — I10 Essential (primary) hypertension: Secondary | ICD-10-CM | POA: Insufficient documentation

## 2015-01-03 DIAGNOSIS — R062 Wheezing: Secondary | ICD-10-CM | POA: Insufficient documentation

## 2015-01-03 DIAGNOSIS — R05 Cough: Secondary | ICD-10-CM | POA: Insufficient documentation

## 2015-01-03 DIAGNOSIS — I499 Cardiac arrhythmia, unspecified: Secondary | ICD-10-CM

## 2015-01-03 LAB — CBC WITH DIFFERENTIAL/PLATELET
BASOS ABS: 0 10*3/uL (ref 0.0–0.1)
Basophils Relative: 0 %
EOS ABS: 0.1 10*3/uL (ref 0.0–0.7)
Eosinophils Relative: 3 %
HCT: 45.5 % (ref 39.0–52.0)
Hemoglobin: 15.3 g/dL (ref 13.0–17.0)
LYMPHS ABS: 1 10*3/uL (ref 0.7–4.0)
Lymphocytes Relative: 26 %
MCH: 33.3 pg (ref 26.0–34.0)
MCHC: 33.6 g/dL (ref 30.0–36.0)
MCV: 98.9 fL (ref 78.0–100.0)
MONO ABS: 0.6 10*3/uL (ref 0.1–1.0)
Monocytes Relative: 16 %
NEUTROS ABS: 2.2 10*3/uL (ref 1.7–7.7)
Neutrophils Relative %: 55 %
PLATELETS: 170 10*3/uL (ref 150–400)
RBC: 4.6 MIL/uL (ref 4.22–5.81)
RDW: 15.2 % (ref 11.5–15.5)
WBC: 3.9 10*3/uL — ABNORMAL LOW (ref 4.0–10.5)

## 2015-01-03 LAB — BASIC METABOLIC PANEL WITH GFR
Anion gap: 11 (ref 5–15)
BUN: 36 mg/dL — ABNORMAL HIGH (ref 6–20)
CO2: 27 mmol/L (ref 22–32)
Calcium: 9.2 mg/dL (ref 8.9–10.3)
Chloride: 98 mmol/L — ABNORMAL LOW (ref 101–111)
Creatinine, Ser: 1.21 mg/dL (ref 0.61–1.24)
GFR calc Af Amer: >60 mL/min
GFR calc non Af Amer: 58 mL/min — ABNORMAL LOW
Glucose, Bld: 138 mg/dL — ABNORMAL HIGH (ref 65–99)
Potassium: 4.5 mmol/L (ref 3.5–5.1)
Sodium: 136 mmol/L (ref 135–145)

## 2015-01-03 LAB — TROPONIN I: Troponin I: 0.03 ng/mL

## 2015-01-03 LAB — BRAIN NATRIURETIC PEPTIDE: B Natriuretic Peptide: 2895.4 pg/mL — ABNORMAL HIGH (ref 0.0–100.0)

## 2015-01-03 MED ORDER — IPRATROPIUM-ALBUTEROL 0.5-2.5 (3) MG/3ML IN SOLN
3.0000 mL | Freq: Four times a day (QID) | RESPIRATORY_TRACT | Status: DC
  Administered 2015-01-03: 3 mL via RESPIRATORY_TRACT
  Filled 2015-01-03: qty 3

## 2015-01-03 MED ORDER — ONDANSETRON HCL 4 MG/2ML IJ SOLN
4.0000 mg | Freq: Once | INTRAMUSCULAR | Status: AC
  Administered 2015-01-03: 4 mg via INTRAVENOUS
  Filled 2015-01-03: qty 2

## 2015-01-03 NOTE — Progress Notes (Signed)
Pre visit review using our clinic review tool, if applicable. No additional management support is needed unless otherwise documented below in the visit note. 

## 2015-01-03 NOTE — ED Notes (Signed)
Patient transported to Ultrasound 

## 2015-01-03 NOTE — Progress Notes (Signed)
Subjective:    Patient ID: Jacob Pittman, male    DOB: 04-08-1946, 69 y.o.   MRN: [id number]  HPI  Here with wife, did not want to go to ER due to the wait, c/o 1 wk onset LUE swelling, sob, and found to have variable rate irregular rhythm.  Pt denies chest pain, wheezing, orthopnea, PND, increased LE swelling, palpitations, dizziness or syncope. No fever. Pt denies new neurological symptoms such as new headache, or facial or extremity weakness or numbness   Pt denies polydipsia, polyuria. C/o insomnia for several yrs Past Medical History  Diagnosis Date  . DIABETES MELLITUS, TYPE II 11/07/2008  . HYPERLIPIDEMIA 11/07/2008  . ANXIETY 02/04/2009  . HYPERTENSION 10/08/2008  . 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 OSTEOARTHRITIS, KNEE, RIGHT 10/08/2008  . HIP PAIN, RIGHT 11/06/2009  . Chronic systolic CHF (congestive heart failure)   . Nonischemic cardiomyopathy     s/p ICD implantation 03-2013 by Dr Ladona Ridgel  . Peripheral arterial disease   . CHF (congestive heart failure)    Past Surgical History  Procedure Laterality Date  . S/p left broken arm with pin      1980's  . Cardiac defibrillator placement Left 04/10/13    BTK single chamber ICD implanted by Dr Ladona Ridgel for primary prevention  . Implantable cardioverter defibrillator implant N/A 04/10/2013    Procedure: IMPLANTABLE CARDIOVERTER DEFIBRILLATOR IMPLANT;  Surgeon: Marinus Maw, MD;  Location: [facility] CATH LAB;  Service: Cardiovascular;  Laterality: N/A;    reports that he has quit smoking. He has never used smokeless tobacco. He reports that he does not drink alcohol or use illicit drugs. family history includes Diabetes in his mother; Hypertension in his mother. No Known Allergies Current Outpatient Prescriptions on File Prior to Visit  Medication Sig Dispense Refill  . albuterol (PROVENTIL HFA;VENTOLIN HFA) 108 (90 BASE) MCG/ACT inhaler Inhale 2 puffs into the lungs every 6 (six) hours as needed for wheezing or shortness of  breath. 1 Inhaler 5  . aspirin 81 MG EC tablet Take 81 mg by mouth daily.      . carvedilol (COREG) 12.5 MG tablet TAKE 1 TABLET (12.5 MG TOTAL) BY MOUTH 2 (TWO) TIMES DAILY. 60 tablet 11  . cetirizine (ZYRTEC) 10 MG tablet Take 1 tablet (10 mg total) by mouth daily. 30 tablet 11  . fluticasone (FLONASE) 50 MCG/ACT nasal spray Place 2 sprays into both nostrils daily. 16 g 2  . lisinopril (PRINIVIL,ZESTRIL) 20 MG tablet Take 1 tablet (20 mg total) by mouth daily. 90 tablet 3  . lovastatin (MEVACOR) 20 MG tablet Take 1 tablet (20 mg total) by mouth at bedtime. 90 tablet 3  . metFORMIN (GLUCOPHAGE-XR) 500 MG 24 hr tablet TAKE 2 TABLETS BY MOUTH DAILY EVERY MORNING 180 tablet 1  . metolazone (ZAROXOLYN) 2.5 MG tablet Take 1 tablet (2.5 mg total) by mouth daily. 30 tablet 6  . spironolactone (ALDACTONE) 25 MG tablet Take 1 tablet (25 mg total) by mouth daily. 90 tablet 3   No current facility-administered medications on file prior to visit.   Review of Systems  Constitutional: Negative for unusual diaphoresis or night sweats HENT: Negative for ringing in ear or discharge Eyes: Negative for double vision or worsening visual disturbance.  Respiratory: Negative for choking and stridor.   Gastrointestinal: Negative for vomiting or other signifcant bowel change Genitourinary: Negative for hematuria or change in urine volume.  Musculoskeletal: Negative for other MSK pain or swelling Skin: Negative for color change and worsening wound.  Neurological: Negative for tremors and numbness other than noted  Psychiatric/Behavioral: Negative for decreased concentration or agitation other than above       Objective:   Physical Exam BP 118/82 mmHg  Pulse 95  Temp(Src) 97.7 F (36.5 C)  Wt 229 lb 8 oz (104.101 kg)  SpO2 91% VS noted,  Constitutional: Pt appears in no significant distress HENT: Head: NCAT.  Right Ear: External ear normal.  Left Ear: External ear normal.  Eyes: . Pupils are equal,  round, and reactive to light. Conjunctivae and EOM are normal Neck: Normal range of motion. Neck supple.  Cardiovascular: Normal rate and irregular rhythm.   Pulmonary/Chest: Effort normal and breath sounds decreased without rales or wheezing.  Neurological: Pt is alert. Not confused , motor grossly intact Skin: Skin is warm. No rash, no LE edema LUE with diffuse 1+ swelling Psychiatric: Pt behavior is normal. No agitation.      Assessment & Plan:

## 2015-01-03 NOTE — ED Notes (Signed)
Pt presents from PCP with lefts arm swelling x2 days, irregular heart beat and occasional SOB. Pt is alert and oriented.

## 2015-01-03 NOTE — ED Provider Notes (Signed)
CSN: [id number]     Arrival date & time 01/03/15  1818 History   This chart was scribed for Zadie Rhine, MD by Gwenyth Ober, ED Scribe. This patient was seen in room MH12/MH12 and the patient's care was started at 6:53 PM.    Chief Complaint  Patient presents with  . Arm Swelling  . Irregular Heart Beat   The history is provided by the patient. No language interpreter was used.    HPI Comments: Jacob Pittman is a 69 y.o. male, with a cardiac defibrillator and a history of DM, hyperlipidemia, HTN and CHF, who presents to the Emergency Department complaining of intermittent, moderate SOB that started 3 days ago. He states intermittent heart palpitations, left arm swelling, coughing, wheezing and leg swelling as associated symptoms. His SOB becomes worse with mild exertion. Pt was seen by his PCP earlier today who referred him to the ED for further evaluation. He denies recent injuries or falls. Pt denies a history of DVT. He also denies CP, fever, vomiting, abdominal pain, syncope and abdominal swelling as associated symptoms. No ICD shocks reported  Past Medical History  Diagnosis Date  . DIABETES MELLITUS, TYPE II 11/07/2008  . HYPERLIPIDEMIA 11/07/2008  . ANXIETY 02/04/2009  . HYPERTENSION 10/08/2008  . 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 OSTEOARTHRITIS, KNEE, RIGHT 10/08/2008  . HIP PAIN, RIGHT 11/06/2009  . Chronic systolic CHF (congestive heart failure)   . Nonischemic cardiomyopathy     s/p ICD implantation 03-2013 by Dr Ladona Ridgel  . Peripheral arterial disease   . CHF (congestive heart failure)    Past Surgical History  Procedure Laterality Date  . S/p left broken arm with pin      1980's  . Cardiac defibrillator placement Left 04/10/13    BTK single chamber ICD implanted by Dr Ladona Ridgel for primary prevention  . Implantable cardioverter defibrillator implant N/A 04/10/2013    Procedure: IMPLANTABLE CARDIOVERTER DEFIBRILLATOR IMPLANT;  Surgeon: Marinus Maw, MD;   Location: [facility] CATH LAB;  Service: Cardiovascular;  Laterality: N/A;   Family History  Problem Relation Age of Onset  . Diabetes Mother   . Hypertension Mother    Social History  Substance Use Topics  . Smoking status: Former Games developer  . Smokeless tobacco: Never Used  . Alcohol Use: No    Review of Systems  Constitutional: Negative for fever.  Respiratory: Positive for cough, shortness of breath and wheezing.   Cardiovascular: Positive for palpitations and leg swelling. Negative for chest pain.  Gastrointestinal: Negative for vomiting.  Musculoskeletal: Positive for joint swelling.  Neurological: Negative for syncope.  All other systems reviewed and are negative.  Allergies  Review of patient's allergies indicates no known allergies.  Home Medications   Prior to Admission medications   Medication Sig Start Date End Date Taking? Authorizing Provider  albuterol (PROVENTIL HFA;VENTOLIN HFA) 108 (90 BASE) MCG/ACT inhaler Inhale 2 puffs into the lungs every 6 (six) hours as needed for wheezing or shortness of breath. 07/19/14   [name], MD  aspirin 81 MG EC tablet Take 81 mg by mouth daily.      Historical Provider, MD  carvedilol (COREG) 12.5 MG tablet TAKE 1 TABLET (12.5 MG TOTAL) BY MOUTH 2 (TWO) TIMES DAILY. 09/03/14   [name], MD  cetirizine (ZYRTEC) 10 MG tablet Take 1 tablet (10 mg total) by mouth daily. 09/22/13   [name], MD  fluticasone (FLONASE) 50 MCG/ACT nasal spray Place 2 sprays into both nostrils daily. 09/22/13  [name], MD  furosemide (LASIX) 40 MG tablet Take 2 tablets ( 80 mg ) twice a day 01/03/15   Peter M [place], MD  lisinopril (PRINIVIL,ZESTRIL) 20 MG tablet Take 1 tablet (20 mg total) by mouth daily. 10/14/12   [name], MD  lovastatin (MEVACOR) 20 MG tablet Take 1 tablet (20 mg total) by mouth at bedtime. 09/22/13   [name], MD  metFORMIN (GLUCOPHAGE-XR) 500 MG 24 hr tablet TAKE 2 TABLETS BY MOUTH DAILY EVERY MORNING 01/01/15   [name],  MD  metolazone (ZAROXOLYN) 2.5 MG tablet Take 1 tablet (2.5 mg total) by mouth daily. 12/25/14   Peter M [place], MD  spironolactone (ALDACTONE) 25 MG tablet Take 1 tablet (25 mg total) by mouth daily. 12/01/12   Peter M [place], MD   BP 142/71 mmHg  Pulse 83  Temp(Src) 98.4 F (36.9 C) (Oral)  Resp 22  Ht 6\' 1"  (1.854 m)  Wt 229 lb (103.874 kg)  BMI 30.22 kg/m2  SpO2 100% Physical Exam  Nursing note and vitals reviewed.  CONSTITUTIONAL: Well developed/well nourished HEAD: Normocephalic/atraumatic EYES: EOMI/PERRL ENMT: Mucous membranes moist NECK: supple no meningeal signs SPINE/BACK:entire spine nontender CV: S1/S2 noted, no murmurs/rubs/gallops noted LUNGS: Decreased breath sounds bilaterally ABDOMEN: soft, nontender, no rebound or guarding, bowel sounds noted throughout abdomen GU:no cva tenderness NEURO: Pt is awake/alert/appropriate, moves all extremitiesx4.  No facial droop.   EXTREMITIES: pulses normal/equal, full ROM; pitting edema to left upper extremity; no LE edema noted, no signs of trauma; no bruising, no tenderness to extremities Distal cap refill less than 3 seconds in both hands SKIN: warm, color normal PSYCH: no abnormalities of mood noted, alert and oriented to situation   ED Course  Procedures  Medications  ipratropium-albuterol (DUONEB) 0.5-2.5 (3) MG/3ML nebulizer solution 3 mL (3 mLs Nebulization Given 01/03/15 2047)  ondansetron (ZOFRAN) injection 4 mg (4 mg Intravenous Given 01/03/15 2136)    DIAGNOSTIC STUDIES: Oxygen Saturation is 100% on RA, normal by my interpretation.    COORDINATION OF CARE: 6:59 PM Discussed treatment plan with pt which includes a chest x-ray, lab work, an [place] of his left arm and albuterol treatment. Pt agreed to plan.  Pt here for multiple complaints He reported dyspnea on exertion, but after albuterol neb reports he is at his baseline He denies CP He is not in distress He denies SOB.  No hypoxia noted For his left UE edema,  no signs of DVT.  No signs of cellulitis and no signs of trauma.  He has no pain in his arm He does not appear to be in decompensated CHF He would like to go home and reports he has cardiology f/u next week This seems reasonable as he is not in distress I doubt PE/ACS No signs of pneumonia at this time  Labs Review Labs Reviewed  BASIC METABOLIC PANEL - Abnormal; Notable for the following:    Chloride 98 (*)    Glucose, Bld 138 (*)    BUN 36 (*)    GFR calc non Af Amer 58 (*)    All other components within normal limits  CBC WITH DIFFERENTIAL/PLATELET - Abnormal; Notable for the following:    WBC 3.9 (*)    All other components within normal limits  BRAIN NATRIURETIC PEPTIDE - Abnormal; Notable for the following:    B Natriuretic Peptide 2895.4 (*)    All other components within normal limits  TROPONIN I    Imaging Review Dg Chest  2 View  01/03/2015   CLINICAL DATA:  69 year old male with left arm swelling and shortness of breath  EXAM: CHEST  2 VIEW  COMPARISON:  Radiograph dated 04/17/2014  FINDINGS: Two views of the chest demonstrate clear lungs. No focal consolidation, pleural effusion, or pneumothorax. Stable cardiomegaly. Left pectoral AICD device. The osseous structures are grossly unremarkable.  IMPRESSION: No acute cardiopulmonary process.  No focal consolidation.   Electronically Signed   By: Elgie Collard M.D.   On: 01/03/2015 19:45   US Venous Img Upper Uni Left  01/03/2015   CLINICAL DATA:  Left arm swelling 1 week.  EXAM: Left UPPER EXTREMITY VENOUS DOPPLER ULTRASOUND  TECHNIQUE: Gray-scale sonography with graded compression, as well as color Doppler and duplex ultrasound were performed to evaluate the upper extremity deep venous system from the level of the subclavian vein and including the jugular, axillary, basilic, radial, ulnar and upper cephalic vein. Spectral Doppler was utilized to evaluate flow at rest and with distal augmentation maneuvers.  Exam somewhat  difficult due to left upper chest defibrillator.  COMPARISON:  None.  FINDINGS: Contralateral Subclavian Vein: Respiratory phasicity is normal and symmetric with the symptomatic side. No evidence of thrombus. Normal compressibility.  Internal Jugular Vein: No evidence of thrombus. Normal compressibility, respiratory phasicity and response to augmentation.  Subclavian Vein: No evidence of thrombus. Normal compressibility, respiratory phasicity and response to augmentation.  Axillary Vein: No evidence of thrombus. Normal compressibility, respiratory phasicity and response to augmentation.  Cephalic Vein: No evidence of thrombus. Normal compressibility, respiratory phasicity and response to augmentation.  Basilic Vein: No evidence of thrombus. Normal compressibility, respiratory phasicity and response to augmentation.  Brachial Veins: No evidence of thrombus. Normal compressibility, respiratory phasicity and response to augmentation.  Radial Veins: No evidence of thrombus. Normal compressibility, respiratory phasicity and response to augmentation.  Ulnar Veins: Difficult to visualize, but are unremarkable.  Venous Reflux:  None visualized.  Other Findings:  None visualized.  IMPRESSION: No evidence of deep venous thrombosis.   Electronically Signed   By: Elberta Fortis M.D.   On: 01/03/2015 20:59   I have personally reviewed and evaluated these images and lab results as part of my medical decision-making.   EKG Interpretation   Date/Time:  Thursday January 03 2015 18:28:47 EDT Ventricular Rate:  83 PR Interval:  144 QRS Duration: 142 QT Interval:  442 QTC Calculation: 519 R Axis:   -95 Text Interpretation:  Sinus rhythm with sinus arrhythmia with frequent  ventricular-paced complexes and with occasional Premature ventricular  complexes Right bundle branch block Anteroseptal infarct , age  undetermined Abnormal ECG No significant change since last tracing  Confirmed by Bebe Shaggy  MD, Nicholson Starace ([id number]) on  01/03/2015 6:42:44 PM      MDM   Final diagnoses:  Dyspnea  Peripheral edema  Chronic congestive heart failure, unspecified congestive heart failure type    Nursing notes including past medical history and social history reviewed and considered in documentation xrays/imaging reviewed by myself and considered during evaluation Labs/vital reviewed myself and considered during evaluation Previous records reviewed and considered    I personally performed the services described in this documentation, which was scribed in my presence. The recorded information has been reviewed and is accurate.      Zadie Rhine, MD 01/03/15 [id number]

## 2015-01-03 NOTE — Patient Instructions (Signed)
Please go now to ER for evaluation for possible blood clot to left arm, shortness of breath and irregular heart beating  Please continue all other medications as before, and refills have been done if requested.  Please have the pharmacy call with any other refills you may need.  Please keep your appointments with your specialists as you may have planned

## 2015-01-05 NOTE — Assessment & Plan Note (Signed)
BP Readings from Last 3 Encounters:  01/03/15 130/83  01/03/15 118/82  12/25/14 128/86   stable overall by history and exam, recent data reviewed with pt, and pt to continue medical treatment as before,  to f/u any worsening symptoms or concerns

## 2015-01-05 NOTE — Assessment & Plan Note (Signed)
Declines ecg, cant r/o new afib , VSS  O/w, needs to present to ER for further evaluation

## 2015-01-05 NOTE — Assessment & Plan Note (Signed)
Etiology unclear, cant r/o chf or PE, needs to present to ER

## 2015-01-05 NOTE — Assessment & Plan Note (Addendum)
Cant r/o dvt , should present to ER for further evaluation  Note:  Total time for pt hx, exam, review of record with pt in the room, determination of diagnoses and plan for further eval and tx is > 40 min, with over 50% spent in coordination and counseling of patient

## 2015-01-07 ENCOUNTER — Ambulatory Visit (INDEPENDENT_AMBULATORY_CARE_PROVIDER_SITE_OTHER): Payer: Medicare Other | Admitting: Cardiology

## 2015-01-07 ENCOUNTER — Encounter: Payer: Self-pay | Admitting: Cardiology

## 2015-01-07 VITALS — BP 116/72 | HR 70 | Ht 73.0 in | Wt 211.0 lb

## 2015-01-07 DIAGNOSIS — L98499 Non-pressure chronic ulcer of skin of other sites with unspecified severity: Secondary | ICD-10-CM

## 2015-01-07 DIAGNOSIS — Z5189 Encounter for other specified aftercare: Secondary | ICD-10-CM

## 2015-01-07 DIAGNOSIS — I739 Peripheral vascular disease, unspecified: Secondary | ICD-10-CM | POA: Diagnosis not present

## 2015-01-07 DIAGNOSIS — E11622 Type 2 diabetes mellitus with other skin ulcer: Secondary | ICD-10-CM

## 2015-01-07 DIAGNOSIS — I429 Cardiomyopathy, unspecified: Secondary | ICD-10-CM

## 2015-01-07 DIAGNOSIS — I428 Other cardiomyopathies: Secondary | ICD-10-CM

## 2015-01-07 DIAGNOSIS — Z79899 Other long term (current) drug therapy: Secondary | ICD-10-CM | POA: Diagnosis not present

## 2015-01-07 DIAGNOSIS — Z95810 Presence of automatic (implantable) cardiac defibrillator: Secondary | ICD-10-CM

## 2015-01-07 DIAGNOSIS — I5023 Acute on chronic systolic (congestive) heart failure: Secondary | ICD-10-CM

## 2015-01-07 MED ORDER — FUROSEMIDE 40 MG PO TABS: ORAL_TABLET | ORAL | Status: DC

## 2015-01-07 MED ORDER — CARVEDILOL 12.5 MG PO TABS: ORAL_TABLET | ORAL | Status: DC

## 2015-01-07 MED ORDER — CEPHALEXIN 500 MG PO CAPS: 500.0000 mg | ORAL_CAPSULE | Freq: Three times a day (TID) | ORAL | Status: DC

## 2015-01-07 MED ORDER — METOLAZONE 2.5 MG PO TABS: 2.5000 mg | ORAL_TABLET | ORAL | Status: DC

## 2015-01-07 MED ORDER — SPIRONOLACTONE 25 MG PO TABS: 12.5000 mg | ORAL_TABLET | Freq: Every day | ORAL | Status: DC

## 2015-01-07 MED ORDER — LISINOPRIL 10 MG PO TABS: 10.0000 mg | ORAL_TABLET | Freq: Every day | ORAL | Status: DC

## 2015-01-07 NOTE — Progress Notes (Signed)
Cardiology Office Note   Date:  01/07/2015   ID:  Jacob Pittman, DOB April 13, 1946, MRN [id number]  PCP:  Oliver Barre, MD  Cardiologist:  Dr. [place]    Chief Complaint  Patient presents with  . Congestive Heart Failure    c/o no chest pain or dizziness  . Shortness of Breath    a lot  . Edema    feet and left arm      History of Present Illness: Jacob Pittman is a 69 y.o. male who presents for CHF.  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 EF of 15% despite optimal medical therapy. He is s/p ICD placement in December 2014. He also has a history of PAD. He was seen by Dr. Allyson Sabal but wasn't having symptoms so conservative therapy was recommended. When seen by Dr. Ladona Ridgel in June and he was noted to be volume overloaded. His BNP was elevated. Lasix was increased to 40 mg bid. Initially he had a great response with 12 lb weight loss and improvement in his symptoms. Since then he noted worsening edema, weight gain, and SOB on last visit with Dr. [place]. Denies any change in diet. Lasix increased to 80 mg in am and 40 mg in pm without improvement. Notes blistering and weeping of his legs.  On last visit his wt was up 17 lbs. He was seen  By Dr. Demetrius Charity. [place] 12/25/14 and lasix continued at 40 mg BID, metolazone added at 2.5 daily for 3 days then every other day.  PLAN if resistant to refer to HF clinic.   Recent Labs with BNP at 2895, troponin neg.   Echo 05/25/14:Study Conclusions  - Left ventricle: The cavity size was mildly dilated. Wall thickness was normal. The estimated ejection fraction was 15%. Diffuse hypokinesis. Features are consistent with a pseudonormal left ventricular filling pattern, with concomitant abnormal relaxation and increased filling pressure (grade 2 diastolic dysfunction). - Aortic valve: There was no stenosis. There was trivial regurgitation. - Mitral valve: Mildly calcified annulus. There was  mild regurgitation. - Left atrium: The atrium was severely dilated. - Right ventricle: The cavity size was normal. Pacer wire or catheter noted in right ventricle. Systolic function was moderately to severely reduced. - Right atrium: The atrium was moderately dilated. - Tricuspid valve: Peak RV-RA gradient (S): 39 mm Hg. - Pulmonary arteries: PA peak pressure: 54 mm Hg (S). - Systemic veins: IVC measured 3.0 cm with < 50% respirophasic variation, suggesting RA pressure 15 mmHg. - Pericardium, extracardiac: A trivial pericardial effusion was identified.  Impressions:  - Mildly dilated LV with severe global hypokinesis, EF 15%. Moderate diastolic dysfunction. Normal RV size with moderate to severe systolic dysfunction. Mild MR. Moderate pulmonary hypertension. Dilated IVC suggestive of elevated RV filling pressure.  TODAY: wt is down 18 lbs.  But on review of his meds he is not on lisinopril or aldactone. The daughter is not sure how long he has been off these meds.  But if it has been awhile that may explain some of the edema.  He is still on metolazone daily.    Also he was in the ER 01/03/15 for lt arm swelling, no pain but sent by his PCP.  EKG SR with RBBB and PVCs but no acute changes, labs were stable though BNP elevated. Venous doppler of LUE was without DVT.   Since that time the edema has resolved.   His lower ext wounds are draining yellowish fluid, he had  them dressed in ER and has not changed the dressings.  No smell.   Past Medical History  Diagnosis Date  . DIABETES MELLITUS, TYPE II 11/07/2008  . HYPERLIPIDEMIA 11/07/2008  . ANXIETY 02/04/2009  . HYPERTENSION 10/08/2008  . 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 OSTEOARTHRITIS, KNEE, RIGHT 10/08/2008  . HIP PAIN, RIGHT 11/06/2009  . Chronic systolic CHF (congestive heart failure)   . Nonischemic cardiomyopathy     s/p ICD implantation 03-2013 by Dr Ladona Ridgel  . Peripheral arterial disease   . CHF  (congestive heart failure)     Past Surgical History  Procedure Laterality Date  . S/p left broken arm with pin      1980's  . Cardiac defibrillator placement Left 04/10/13    BTK single chamber ICD implanted by Dr Ladona Ridgel for primary prevention  . Implantable cardioverter defibrillator implant N/A 04/10/2013    Procedure: IMPLANTABLE CARDIOVERTER DEFIBRILLATOR IMPLANT;  Surgeon: Marinus Maw, MD;  Location: [facility] CATH LAB;  Service: Cardiovascular;  Laterality: N/A;     Current Outpatient Prescriptions  Medication Sig Dispense Refill  . albuterol (PROVENTIL HFA;VENTOLIN HFA) 108 (90 BASE) MCG/ACT inhaler Inhale 2 puffs into the lungs every 6 (six) hours as needed for wheezing or shortness of breath. 1 Inhaler 5  . aspirin 81 MG EC tablet Take 81 mg by mouth daily.      . carvedilol (COREG) 12.5 MG tablet TAKE 1 TABLET (12.5 MG TOTAL) BY MOUTH 2 (TWO) TIMES DAILY. 60 tablet 11  . cetirizine (ZYRTEC) 10 MG tablet Take 1 tablet (10 mg total) by mouth daily. 30 tablet 11  . fluticasone (FLONASE) 50 MCG/ACT nasal spray Place 2 sprays into both nostrils daily. 16 g 2  . furosemide (LASIX) 40 MG tablet Take 2 tablets ( 80 mg ) twice a day 100 tablet 3  . lovastatin (MEVACOR) 20 MG tablet Take 1 tablet (20 mg total) by mouth at bedtime. 90 tablet 3  . metFORMIN (GLUCOPHAGE-XR) 500 MG 24 hr tablet TAKE 2 TABLETS BY MOUTH DAILY EVERY MORNING 180 tablet 1  . metolazone (ZAROXOLYN) 2.5 MG tablet Take 1 tablet (2.5 mg total) by mouth every other day. 30 tablet 6  . spironolactone (ALDACTONE) 25 MG tablet Take 0.5 tablets (12.5 mg total) by mouth daily. 30 tablet 3  . cephALEXin (KEFLEX) 500 MG capsule Take 1 capsule (500 mg total) by mouth 3 (three) times daily. 21 capsule 0  . lisinopril (PRINIVIL,ZESTRIL) 10 MG tablet Take 1 tablet (10 mg total) by mouth daily. 30 tablet 6   No current facility-administered medications for this visit.    Allergies:   Review of patient's allergies indicates no  known allergies.    Social History:  The patient  reports that he has quit smoking. He has never used smokeless tobacco. He reports that he does not drink alcohol or use illicit drugs.   Family History:  The patient's family history includes Diabetes in his mother; Hypertension in his mother.    ROS:  General:no colds or fevers, decrease of weight by 18 lbs.  This is his most dry wt in some time. Skin:no rashes + ulcers of both shins, redressed  HEENT:no blurred vision, no congestion CV:see HPI PUL:see HPI GI:no diarrhea constipation or melena, no indigestion GU:no hematuria, no dysuria MS:no joint pain, mild claudication with walking, last dopplers + PAD. Neuro:no syncope, no lightheadedness Endo:+ diabetes, no thyroid disease  Wt Readings from Last 3 Encounters:  01/07/15 211 lb (95.709 kg)  01/03/15 229  lb (103.874 kg)  01/03/15 229 lb 8 oz (104.101 kg)     PHYSICAL EXAM: VS:  BP 116/72 mmHg  Pulse 70  Ht  (1.854 m)  Wt 211 lb (95.709 kg)  BMI 27.84 kg/m2 , BMI Body mass index is 27.84 kg/(m^2). General:Pleasant affect, NAD Skin:Warm and dry, brisk capillary refill HEENT:normocephalic, sclera clear, mucus membranes moist Neck:supple, no JVD sitting , no bruits  Heart:S1S2 RRR without murmur, gallup, rub or click Lungs:clear without rales, rhonchi, or wheezes ZOX:WRUE, non tender, + BS, do not palpate liver spleen or masses Ext:1-2+ lower ext edema just below the knees down,  2+ radial pulses Neuro:alert and oriented X 3, MAE, follows commands, + facial symmetry    EKG:  EKG is NOT ordered today. Reviewed one in ER on the 15th Sept.    Recent Labs: 05/23/2014: ALT 24; TSH 2.13 10/03/2014: Pro B Natriuretic peptide (BNP) 3036.0* 01/03/2015: B Natriuretic Peptide 2895.4*; BUN 36*; Creatinine, Ser 1.21; Hemoglobin 15.3; Platelets 170; Potassium 4.5; Sodium 136    Lipid Panel    Component Value Date/Time   CHOL 151 05/23/2014 1440   TRIG 135.0 05/23/2014 1440     HDL 28.80* 05/23/2014 1440   CHOLHDL 5 05/23/2014 1440   VLDL 27.0 05/23/2014 1440   LDLCALC 95 05/23/2014 1440   LDLDIRECT 141.6 05/12/2013 1611       Other studies Reviewed: Additional studies/ records that were reviewed today include: lower ext art dopplers. Upper ext dopplers, labs, OV notes.  ASSESSMENT AND PLAN:  1. Acute on Chronic systolic congestive heart failure. Ejection fraction 15% despite optimal medical therapy. This may be related to chronic moonshine abuse. No significant ischemia or infarction noted on Myoview study. Patient is no longer drinking alcohol. He is on appropriate therapy with lisinopril, carvedilol, Aldactone, and Lasix. Now weight is back up 17 lbs. Lasix 40 mg bid. We will continue sodium restriction 2 g daily. Metolazone 2.5 mg daily for 3 days then every other day. But pt continued the metolazone daily. Today his daughter tells me he is having trouble affording the meds and is not on lisinopril or aldactone.  With BP soft and continued edema we will add back lisinopril at 10 mg daily, aldactone at 12.5 mg daily.  Decrease metolazone to every other day. Will see back in 2 weeks if no improvement then May need to consider referral to CHF clinic if he is resistant.   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 Hypertension-controlled  4. DM type 2. Per IM  5. S/p prophylactic ICD. Follow up in device clinic  6. PAD- has bilateral leg wounds- will recheck dopplers have home health check wounds for dressing changes.  Has regular follow up with podiatry.  Add keflex 500 TID for 7 days yellow serous drainage.     Current medicines are reviewed with the patient today.  The patient Has no concerns regarding medicines.  The following changes have been made:  See above Labs/ tests ordered today include:see above  Disposition:   FU:  see above  Signed, Leone Brand, NP  01/07/2015 1:49 PM    [facility] [address] [place], [place],  [place]  [address]/ [address] [place], [place] Phone: [phone]; Fax: [phone]  [phone]

## 2015-01-07 NOTE — Patient Instructions (Signed)
Your physician has recommended you make the following change in your medication: your prescriptions were printed out and given to you today. Please take as directed on the bottles.  You have been referred to [facility]  To manage your wound care and social work needs.  Your physician recommends that you return for lab work on Monday 01/14/15  Your physician recommends that you schedule a follow-up appointment in: 2 weeks with an extender.

## 2015-01-10 ENCOUNTER — Encounter: Payer: Self-pay | Admitting: Internal Medicine

## 2015-01-11 ENCOUNTER — Ambulatory Visit (HOSPITAL_COMMUNITY)
Admission: RE | Admit: 2015-01-11 | Discharge: 2015-01-11 | Disposition: A | Discharge status: Home / Self Care | Payer: Medicare Other | Source: Ambulatory Visit | Attending: Cardiology | Admitting: Cardiology

## 2015-01-11 ENCOUNTER — Other Ambulatory Visit: Payer: Self-pay | Admitting: Internal Medicine

## 2015-01-11 ENCOUNTER — Other Ambulatory Visit: Payer: Self-pay | Admitting: Cardiology

## 2015-01-11 DIAGNOSIS — E119 Type 2 diabetes mellitus without complications: Secondary | ICD-10-CM | POA: Insufficient documentation

## 2015-01-11 DIAGNOSIS — Z87891 Personal history of nicotine dependence: Secondary | ICD-10-CM | POA: Diagnosis not present

## 2015-01-11 DIAGNOSIS — I739 Peripheral vascular disease, unspecified: Secondary | ICD-10-CM

## 2015-01-11 DIAGNOSIS — E785 Hyperlipidemia, unspecified: Secondary | ICD-10-CM | POA: Insufficient documentation

## 2015-01-11 DIAGNOSIS — I10 Essential (primary) hypertension: Secondary | ICD-10-CM | POA: Diagnosis not present

## 2015-01-21 ENCOUNTER — Other Ambulatory Visit: Payer: Self-pay | Admitting: *Deleted

## 2015-01-21 ENCOUNTER — Ambulatory Visit (INDEPENDENT_AMBULATORY_CARE_PROVIDER_SITE_OTHER): Payer: Medicare Other | Admitting: Physician Assistant

## 2015-01-21 ENCOUNTER — Encounter: Payer: Self-pay | Admitting: Physician Assistant

## 2015-01-21 VITALS — BP 110/68 | HR 62 | Ht 72.0 in | Wt 210.8 lb

## 2015-01-21 DIAGNOSIS — I83029 Varicose veins of left lower extremity with ulcer of unspecified site: Secondary | ICD-10-CM

## 2015-01-21 DIAGNOSIS — I5043 Acute on chronic combined systolic (congestive) and diastolic (congestive) heart failure: Secondary | ICD-10-CM | POA: Diagnosis not present

## 2015-01-21 DIAGNOSIS — I739 Peripheral vascular disease, unspecified: Secondary | ICD-10-CM

## 2015-01-21 DIAGNOSIS — L97909 Non-pressure chronic ulcer of unspecified part of unspecified lower leg with unspecified severity: Secondary | ICD-10-CM

## 2015-01-21 DIAGNOSIS — L97929 Non-pressure chronic ulcer of unspecified part of left lower leg with unspecified severity: Secondary | ICD-10-CM

## 2015-01-21 DIAGNOSIS — I428 Other cardiomyopathies: Secondary | ICD-10-CM

## 2015-01-21 DIAGNOSIS — I83009 Varicose veins of unspecified lower extremity with ulcer of unspecified site: Secondary | ICD-10-CM

## 2015-01-21 DIAGNOSIS — Z95810 Presence of automatic (implantable) cardiac defibrillator: Secondary | ICD-10-CM

## 2015-01-21 DIAGNOSIS — I429 Cardiomyopathy, unspecified: Secondary | ICD-10-CM

## 2015-01-21 DIAGNOSIS — I10 Essential (primary) hypertension: Secondary | ICD-10-CM

## 2015-01-21 DIAGNOSIS — I5041 Acute combined systolic (congestive) and diastolic (congestive) heart failure: Secondary | ICD-10-CM | POA: Diagnosis not present

## 2015-01-21 DIAGNOSIS — S80829A Blister (nonthermal), unspecified lower leg, initial encounter: Secondary | ICD-10-CM

## 2015-01-21 DIAGNOSIS — Z5189 Encounter for other specified aftercare: Secondary | ICD-10-CM

## 2015-01-21 NOTE — Patient Instructions (Addendum)
One the days you take metolozone take it 30 minutes prior to taking the furosemide.  Your physician recommends that you weigh, daily, at the same time every day, and in the same amount of clothing. Please record your daily weights on the handout provided and bring it to your next appointment. If you gain 3 lbs in 24 hours or 5 lbs in one week call the office.  A referral has been placed to the Heart Failure clinic.  A referral has been placed to the Wound Center.   Your physician recommends that you schedule a follow-up appointment in: ONE MONTH WITH Jacob Finlay, PA

## 2015-01-21 NOTE — Progress Notes (Signed)
Patient ID: Jacob Pittman, male   DOB: 12/29/1945, 69 y.o.   MRN: [id number]    Date:  01/21/2015   ID:  Jacob Pittman, DOB [birth year], MRN [id number]  PCP:  Jacob Barre, MD  Primary Cardiologist:  [place]  Chief Complaint  Patient presents with  . Follow-up    LE doppler results.  No complaints of chest pain, SOB, edema, claudication or dizziness.     History of Present Illness: Jacob Pittman is a 69 y.o. 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 EF of 15% despite optimal medical therapy. He is s/p ICD placement in December 2014. He also has a history of PAD. He was seen by Dr. Allyson Sabal but wasn't having symptoms so conservative therapy was recommended. When seen by Dr. Ladona Ridgel in June and he was noted to be volume overloaded. His BNP was elevated. Lasix was increased to 40 mg bid. Initially he had a great response with 12 lb weight loss and improvement in his symptoms. Since then he noted worsening edema, weight gain, and SOB on last visit with Dr. [place]. Denies any change in diet. Lasix increased to 80 mg in am and 40 mg in pm without improvement. Notes blistering and weeping of his legs. On last visit his wt was up 17 lbs. He was seen By Dr. Demetrius Charity. [place] 12/25/14 and lasix continued at 40 mg BID, metolazone added at 2.5 daily for 3 days then every other day. PLAN if resistant to refer to HF clinic.   Patient is here for his 2 week evaluation for heart failure.   The patient feels that he is breathing better and walking better. He sleeps on one pillow is not waking up feeling short of breath.  The patient currently denies nausea, vomiting, fever, chest pain, orthopnea, dizziness, PND, cough, congestion, abdominal pain, hematochezia, melena,  claudication.  Wt Readings from Last 3 Encounters:  01/21/15 95.618 kg (210 lb 12.8 oz)  01/07/15 95.709 kg (211 lb)  01/03/15 103.874 kg (229 lb)     Past Medical History  Diagnosis Date  .  DIABETES MELLITUS, TYPE II 11/07/2008  . HYPERLIPIDEMIA 11/07/2008  . ANXIETY 02/04/2009  . HYPERTENSION 10/08/2008  . 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 OSTEOARTHRITIS, KNEE, RIGHT 10/08/2008  . HIP PAIN, RIGHT 11/06/2009  . Chronic systolic CHF (congestive heart failure) (HCC)   . Nonischemic cardiomyopathy [facility])     s/p ICD implantation 03-2013 by Dr Ladona Ridgel  . Peripheral arterial disease (HCC)   . CHF (congestive heart failure) (HCC)     Current Outpatient Prescriptions  Medication Sig Dispense Refill  . albuterol (PROVENTIL HFA;VENTOLIN HFA) 108 (90 BASE) MCG/ACT inhaler Inhale 2 puffs into the lungs every 6 (six) hours as needed for wheezing or shortness of breath. 1 Inhaler 5  . aspirin 81 MG EC tablet Take 81 mg by mouth daily.      . carvedilol (COREG) 12.5 MG tablet TAKE 1 TABLET (12.5 MG TOTAL) BY MOUTH 2 (TWO) TIMES DAILY. 60 tablet 11  . cetirizine (ZYRTEC) 10 MG tablet Take 1 tablet (10 mg total) by mouth daily. 30 tablet 11  . fluticasone (FLONASE) 50 MCG/ACT nasal spray Place 2 sprays into both nostrils daily. 16 g 2  . furosemide (LASIX) 40 MG tablet Take 2 tablets ( 80 mg ) twice a day 100 tablet 3  . lisinopril (PRINIVIL,ZESTRIL) 10 MG tablet Take 1 tablet (10 mg total) by mouth daily. 30 tablet 6  . lovastatin (MEVACOR) 20  MG tablet Take 1 tablet (20 mg total) by mouth at bedtime. 90 tablet 3  . metFORMIN (GLUCOPHAGE-XR) 500 MG 24 hr tablet TAKE 2 TABLETS BY MOUTH DAILY EVERY MORNING 180 tablet 1  . metolazone (ZAROXOLYN) 2.5 MG tablet Take 1 tablet (2.5 mg total) by mouth every other day. 30 tablet 6  . spironolactone (ALDACTONE) 25 MG tablet Take 0.5 tablets (12.5 mg total) by mouth daily. 30 tablet 3   No current facility-administered medications for this visit.    Allergies:   No Known Allergies  Social History:  The patient  reports that he has quit smoking. He has never used smokeless tobacco. He reports that he does not drink alcohol or use illicit drugs.    Family history:   Family History  Problem Relation Age of Onset  . Diabetes Mother   . Hypertension Mother     ROS:  Please see the history of present illness.  All other systems reviewed and negative.   PHYSICAL EXAM: VS:  BP 110/68 mmHg  Pulse 62  Ht 6' (1.829 m)  Wt 95.618 kg (210 lb 12.8 oz)  BMI 28.58 kg/m2 Well nourished, well developed, in no acute distress HEENT: Pupils are equal round react to light accommodation extraocular movements are intact.  Neck: elevated JVDNo cervical lymphadenopathy. Cardiac: Regular rate and rhythm without murmurs rubs or gallops. Lungs:  clear to auscultation bilaterally, no wheezing, rhonchi or rales Abd: soft, nontender, positive bowel sounds all quadrants, no hepatosplenomegaly Ext:1+ lower extremity edema.  2+ radial and dorsalis pedis pulses. Skin: warm and dry.  He has two ~1.0-1.5 cm pink lesions on the medial aspect of his left shin.  The wounds are moist.  No odor or purulent discharge.  The right LE has a few wounds which are healed over and dry.  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 Neuro:  Grossly normal     ASSESSMENT AND PLAN:  Problem List Items Addressed This Visit    Venous stasis ulcer of left lower extremity (HCC)   Peripheral arterial disease (HCC)   Nonischemic cardiomyopathy (HCC)   ICD (implantable cardioverter-defibrillator), single, in situ   Essential hypertension   Acute on chronic combined systolic and diastolic CHF (congestive heart failure) (HCC)    Other Visit Diagnoses    Acute combined systolic and diastolic heart failure (HCC)    -  Primary    Relevant Orders    AMB referral to CHF clinic    Venous stasis ulcer, unspecified laterality (HCC)        Relevant Orders    AMB referral to wound care center       Acute on chronic combined systolic and diastolic CHF (congestive heart failure) (HCC)  The patient's weight is stable since 9/19.  He does appear to be mildly volume overloaded  with an elevated JVD. Take some of his lower extremity edema is related to venous insufficiency. He reports his symptoms have improved considerably. He has no orthopnea or PND. He has been taking the metolazone every other day with his other medications but not taking it 30 minutes before the Lasix.  Continue continue Aldactone, furosemide, metolazone. He is also on ACE inhibitor and blood pressures well-controlled.  Given his severely decreased ejection fraction and grade 2 diastolic dysfunction will refer him to advanced heart failure.  Venous stasis ulcers bilateral   the right leg is healingand dry. Left leg has no signs of infection as pink granulating base and moist. We rewrapped the leg syndrome  will refer him to wound clinic.   Peripheral artery disease   the patient had lower extremity arterial Dopplers on September 23 which do not show any elevated velocities. He has completed his round of Keflex   Essential hypertension: Blood pressure well-controlled no changes in medications

## 2015-01-24 ENCOUNTER — Ambulatory Visit: Payer: Medicare Other | Admitting: Cardiology

## 2015-02-06 ENCOUNTER — Encounter (HOSPITAL_COMMUNITY): Payer: Medicare Other | Admitting: Internal Medicine

## 2015-02-13 ENCOUNTER — Encounter (HOSPITAL_BASED_OUTPATIENT_CLINIC_OR_DEPARTMENT_OTHER): Payer: Medicare Other | Attending: Surgery

## 2015-03-04 ENCOUNTER — Ambulatory Visit (INDEPENDENT_AMBULATORY_CARE_PROVIDER_SITE_OTHER): Payer: Medicare Other | Admitting: Physician Assistant

## 2015-03-04 ENCOUNTER — Encounter: Payer: Self-pay | Admitting: Physician Assistant

## 2015-03-04 VITALS — BP 118/60 | HR 54 | Ht 73.0 in | Wt 206.1 lb

## 2015-03-04 DIAGNOSIS — Z79899 Other long term (current) drug therapy: Secondary | ICD-10-CM | POA: Diagnosis not present

## 2015-03-04 DIAGNOSIS — I5042 Chronic combined systolic (congestive) and diastolic (congestive) heart failure: Secondary | ICD-10-CM

## 2015-03-04 DIAGNOSIS — I428 Other cardiomyopathies: Secondary | ICD-10-CM

## 2015-03-04 DIAGNOSIS — Z95810 Presence of automatic (implantable) cardiac defibrillator: Secondary | ICD-10-CM

## 2015-03-04 DIAGNOSIS — N529 Male erectile dysfunction, unspecified: Secondary | ICD-10-CM | POA: Diagnosis not present

## 2015-03-04 DIAGNOSIS — I10 Essential (primary) hypertension: Secondary | ICD-10-CM

## 2015-03-04 DIAGNOSIS — L97929 Non-pressure chronic ulcer of unspecified part of left lower leg with unspecified severity: Secondary | ICD-10-CM

## 2015-03-04 DIAGNOSIS — E785 Hyperlipidemia, unspecified: Secondary | ICD-10-CM

## 2015-03-04 DIAGNOSIS — I429 Cardiomyopathy, unspecified: Secondary | ICD-10-CM

## 2015-03-04 DIAGNOSIS — I739 Peripheral vascular disease, unspecified: Secondary | ICD-10-CM

## 2015-03-04 DIAGNOSIS — I83029 Varicose veins of left lower extremity with ulcer of unspecified site: Secondary | ICD-10-CM

## 2015-03-04 LAB — BASIC METABOLIC PANEL
BUN: 22 mg/dL (ref 7–25)
CHLORIDE: 101 mmol/L (ref 98–110)
CO2: 27 mmol/L (ref 20–31)
Calcium: 9.5 mg/dL (ref 8.6–10.3)
Creat: 0.91 mg/dL (ref 0.70–1.18)
Glucose, Bld: 88 mg/dL (ref 65–99)
POTASSIUM: 4.9 mmol/L (ref 3.5–5.3)
SODIUM: 135 mmol/L (ref 135–146)

## 2015-03-04 MED ORDER — LOVASTATIN 20 MG PO TABS: 20.0000 mg | ORAL_TABLET | Freq: Every day | ORAL | Status: DC

## 2015-03-04 MED ORDER — TADALAFIL 2.5 MG PO TABS: 2.5000 mg | ORAL_TABLET | Freq: Every day | ORAL | Status: DC | PRN

## 2015-03-04 NOTE — Patient Instructions (Addendum)
Your physician wants you to follow-up in: 3 Months with Dr [place] You will receive a reminder letter in the mail two months in advance. If you don't receive a letter, please call our office to schedule the follow-up appointment.  Your physician has recommended you make the following change in your medication: Take Lovastatin 20 mg daily  Your physician recommends that you return for lab work in: [facility] today

## 2015-03-04 NOTE — Progress Notes (Signed)
Patient ID: Jacob Pittman, male   DOB: 03/05/1946, 69 y.o.   MRN: [id number]    Date:  03/04/2015   ID:  Jacob Pittman, DOB 1945/12/29, MRN [id number]  PCP:  Oliver Barre, MD  Primary Cardiologist:  [place]  Chief complaint:  Follow-up acute on chronic congestive heart failure   History of Present Illness: Jacob Pittman is a 69 y.o. 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 EF of 15% despite optimal medical therapy. He is s/p ICD placement in December 2014. He also has a history of PAD. He was seen by Dr. Allyson Sabal but wasn't having symptoms so conservative therapy was recommended. When seen by Dr. Ladona Ridgel in June and he was noted to be volume overloaded. His BNP was elevated. Lasix was increased to 40 mg bid. Initially he had a great response with 12 lb weight loss and improvement in his symptoms. Since then he noted worsening edema, weight gain, and SOB on last visit with Dr. [place]. Denies any change in diet. Lasix increased to 80 mg in am and 40 mg in pm without improvement. He had blistering and weeping of his legs. September 15 patient's weight was 225 pounds, 01/21/2015 he was 210.   [place] 12/25/14 and lasix continued at 40 mg BID, metolazone added at 2.5 daily for 3 days then every other day.   Patient was then seen on September 19 by Nada Boozer is having a hard time affording medications and was not taking several of them. She added back lisinopril at 10 mg daily, Aldactone 12.5 mg daily and decreased his metolazone to every other day.   He then saw him 2 weeks after that for follow-up and his weight had been stable with no complaints. He is now here for her 6 week visit.  His weight is now 206 pounds, which is 4 pounds less than previously. 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  He denies shortness of breath, orthopnea, PND, lower extremity edema. He says his wounds on his  lower extremities have healed up well.  He also denies nausea, vomiting, fever, chest pain, dizziness, cough, congestion, abdominal pain, hematochezia, melena, claudication.    Wt Readings from Last 3 Encounters:  03/04/15 206 lb 1.6 oz (93.486 kg)  01/21/15 210 lb 12.8 oz (95.618 kg)  01/07/15 211 lb (95.709 kg)     Past Medical History  Diagnosis Date  . DIABETES MELLITUS, TYPE II 11/07/2008  . HYPERLIPIDEMIA 11/07/2008  . ANXIETY 02/04/2009  . HYPERTENSION 10/08/2008  . 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 OSTEOARTHRITIS, KNEE, RIGHT 10/08/2008  . HIP PAIN, RIGHT 11/06/2009  . Chronic systolic CHF (congestive heart failure) (HCC)   . Nonischemic cardiomyopathy [facility])     s/p ICD implantation 03-2013 by Dr Ladona Ridgel  . Peripheral arterial disease (HCC)   . CHF (congestive heart failure) (HCC)     Current Outpatient Prescriptions  Medication Sig Dispense Refill  . albuterol (PROVENTIL HFA;VENTOLIN HFA) 108 (90 BASE) MCG/ACT inhaler Inhale 2 puffs into the lungs every 6 (six) hours as needed for wheezing or shortness of breath. 1 Inhaler 5  . aspirin 81 MG EC tablet Take 81 mg by mouth daily.      . carvedilol (COREG) 12.5 MG tablet TAKE 1 TABLET (12.5 MG TOTAL) BY MOUTH 2 (TWO) TIMES DAILY. 60 tablet 11  . cetirizine (ZYRTEC) 10 MG tablet Take 1 tablet (10 mg total) by mouth  daily. 30 tablet 11  . fluticasone (FLONASE) 50 MCG/ACT nasal spray Place 2 sprays into both nostrils daily. 16 g 2  . furosemide (LASIX) 40 MG tablet Take 2 tablets ( 80 mg ) twice a day 100 tablet 3  . lisinopril (PRINIVIL,ZESTRIL) 10 MG tablet Take 1 tablet (10 mg total) by mouth daily. 30 tablet 6  . metFORMIN (GLUCOPHAGE-XR) 500 MG 24 hr tablet TAKE 2 TABLETS BY MOUTH DAILY EVERY MORNING 180 tablet 1  . metolazone (ZAROXOLYN) 2.5 MG tablet Take 1 tablet (2.5 mg total) by mouth every other day. 30 tablet 6  . spironolactone (ALDACTONE) 25 MG tablet Take 0.5 tablets (12.5 mg total) by mouth daily. 30 tablet 3    . lovastatin (MEVACOR) 20 MG tablet Take 1 tablet (20 mg total) by mouth at bedtime. 90 tablet 3  . Tadalafil 2.5 MG TABS Take 1 tablet (2.5 mg total) by mouth daily as needed. 20 each 0   No current facility-administered medications for this visit.    Allergies:   No Known Allergies  Social History:  The patient  reports that he has quit smoking. He has never used smokeless tobacco. He reports that he does not drink alcohol or use illicit drugs.   Family history:   Family History  Problem Relation Age of Onset  . Diabetes Mother   . Hypertension Mother     ROS:  Please see the history of present illness.  All other systems reviewed and negative.   PHYSICAL EXAM: VS:  BP 118/60 mmHg  Pulse 54  Ht 6\' 1"  (1.854 m)  Wt 206 lb 1.6 oz (93.486 kg)  BMI 27.20 kg/m2 Well nourished, well developed, in no acute distress HEENT: Pupils are equal round react to light accommodation extraocular movements are intact.  Neck: no JVDNo cervical lymphadenopathy. Cardiac: Regular rate and rhythm without murmurs rubs or gallops. Lungs:  clear to auscultation bilaterally, no wheezing, rhonchi or rales Abd: soft, nontender, positive bowel sounds all quadrants, no hepatosplenomegaly Ext: Trace lower extremity edema.  2+ radial and dorsalis pedis pulses. Skin: warm and dry.  Wounds on the lower extremities have healed up. Neuro:  Grossly normal    ASSESSMENT AND PLAN:  Problem List Items Addressed This Visit    Venous stasis ulcer of left lower extremity (HCC)   Peripheral arterial disease (HCC)   Relevant Medications   Tadalafil 2.5 MG TABS   lovastatin (MEVACOR) 20 MG tablet   Nonischemic cardiomyopathy (HCC)   Relevant Medications   Tadalafil 2.5 MG TABS   lovastatin (MEVACOR) 20 MG tablet   ICD (implantable cardioverter-defibrillator), single, in situ   Hyperlipidemia   Relevant Medications   Tadalafil 2.5 MG TABS   lovastatin (MEVACOR) 20 MG tablet   Essential hypertension    Relevant Medications   Tadalafil 2.5 MG TABS   lovastatin (MEVACOR) 20 MG tablet   ERECTILE DYSFUNCTION, ORGANIC   Chronic combined systolic and diastolic heart failure, NYHA class 3 (HCC)   Relevant Medications   Tadalafil 2.5 MG TABS   lovastatin (MEVACOR) 20 MG tablet    Other Visit Diagnoses    Medication management    -  Primary    Relevant Orders    Basic Metabolic Panel (BMET)      Acute on chronic systolic congestive heart failure Patient appears euvolemic. He has a trace lower extremity edema. Discussed all his medications including metolazone. He says he is taking it 30 minutes before the Lasix. He is also on Coreg, lisinopril  and spironolactone.  He is weighing himself daily and watching his diet. We'll check a basic metabolic panel today  Follow-up in 3 months with Dr. 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 Follows up in device clinic  Peripheral arterial disease Patient was started on Keflex 500 mg 3 times daily 2 months ago for bilateral leg wounds. He is followed by podiatry.   The ulcers are well-healed.  Erectile dysfunction  I have added 5 mg Cialis when necessary.  Hyperlipidemia Statin drug was previously too expensive. He is now getting from [place]. We have resent prescription.

## 2015-03-13 ENCOUNTER — Ambulatory Visit: Payer: Medicare Other | Admitting: Podiatry

## 2015-03-20 ENCOUNTER — Encounter (HOSPITAL_BASED_OUTPATIENT_CLINIC_OR_DEPARTMENT_OTHER): Payer: Medicare Other

## 2015-03-21 ENCOUNTER — Ambulatory Visit (INDEPENDENT_AMBULATORY_CARE_PROVIDER_SITE_OTHER): Payer: Medicare Other | Admitting: *Deleted

## 2015-03-21 DIAGNOSIS — I428 Other cardiomyopathies: Secondary | ICD-10-CM

## 2015-03-21 DIAGNOSIS — I429 Cardiomyopathy, unspecified: Secondary | ICD-10-CM

## 2015-03-22 NOTE — Progress Notes (Signed)
Remote ICD transmission.   

## 2015-04-03 ENCOUNTER — Ambulatory Visit (INDEPENDENT_AMBULATORY_CARE_PROVIDER_SITE_OTHER): Payer: Medicare Other | Admitting: Podiatry

## 2015-04-03 ENCOUNTER — Encounter: Payer: Self-pay | Admitting: Podiatry

## 2015-04-03 DIAGNOSIS — B351 Tinea unguium: Secondary | ICD-10-CM

## 2015-04-03 DIAGNOSIS — M79676 Pain in unspecified toe(s): Secondary | ICD-10-CM | POA: Diagnosis not present

## 2015-04-03 LAB — CUP PACEART REMOTE DEVICE CHECK
HighPow Impedance: 77 Ohm
Implantable Lead Implant Date: 20141222
Implantable Lead Location: 753860
Lead Channel Impedance Value: 450 Ohm
Lead Channel Sensing Intrinsic Amplitude: 16.5 mV
MDC IDC LEAD MODEL: 365501
MDC IDC LEAD SERIAL: 10570878
MDC IDC MSMT LEADCHNL RA SENSING INTR AMPL: 3.1 mV
MDC IDC MSMT LEADCHNL RV PACING THRESHOLD AMPLITUDE: 0.6 V
MDC IDC MSMT LEADCHNL RV PACING THRESHOLD PULSEWIDTH: 0.4 ms
MDC IDC SESS DTM: 20161214110337
MDC IDC STAT BRADY RV PERCENT PACED: 12 %
Pulse Gen Model: 383594
Pulse Gen Serial Number: 60733905

## 2015-04-03 NOTE — Patient Instructions (Signed)
Diabetes and Foot Care Diabetes may cause you to have problems because of poor blood supply (circulation) to your feet and legs. This may cause the skin on your feet to become thinner, break easier, and heal more slowly. Your skin may become dry, and the skin may peel and crack. You may also have nerve damage in your legs and feet causing decreased feeling in them. You may not notice minor injuries to your feet that could lead to infections or more serious problems. Taking care of your feet is one of the most important things you can do for yourself.  HOME CARE INSTRUCTIONS  Wear shoes at all times, even in the house. Do not go barefoot. Bare feet are easily injured.  Check your feet daily for blisters, cuts, and redness. If you cannot see the bottom of your feet, use a mirror or ask someone for help.  Wash your feet with warm water (do not use hot water) and mild soap. Then pat your feet and the areas between your toes until they are completely dry. Do not soak your feet as this can dry your skin.  Apply a moisturizing lotion or petroleum jelly (that does not contain alcohol and is unscented) to the skin on your feet and to dry, brittle toenails. Do not apply lotion between your toes.  Trim your toenails straight across. Do not dig under them or around the cuticle. File the edges of your nails with an emery board or nail file.  Do not cut corns or calluses or try to remove them with medicine.  Wear clean socks or stockings every day. Make sure they are not too tight. Do not wear knee-high stockings since they may decrease blood flow to your legs.  Wear shoes that fit properly and have enough cushioning. To break in new shoes, wear them for just a few hours a day. This prevents you from injuring your feet. Always look in your shoes before you put them on to be sure there are no objects inside.  Do not cross your legs. This may decrease the blood flow to your feet.  If you find a minor scrape,  cut, or break in the skin on your feet, keep it and the skin around it clean and dry. These areas may be cleansed with mild soap and water. Do not cleanse the area with peroxide, alcohol, or iodine.  When you remove an adhesive bandage, be sure not to damage the skin around it.  If you have a wound, look at it several times a day to make sure it is healing.  Do not use heating pads or hot water bottles. They may burn your skin. If you have lost feeling in your feet or legs, you may not know it is happening until it is too late.  Make sure your health care provider performs a complete foot exam at least annually or more often if you have foot problems. Report any cuts, sores, or bruises to your health care provider immediately. SEEK MEDICAL CARE IF:   You have an injury that is not healing.  You have cuts or breaks in the skin.  You have an ingrown nail.  You notice redness on your legs or feet.  You feel burning or tingling in your legs or feet.  You have pain or cramps in your legs and feet.  Your legs or feet are numb.  Your feet always feel cold. SEEK IMMEDIATE MEDICAL CARE IF:   There is increasing redness,   swelling, or pain in or around a wound.  There is a red line that goes up your leg.  Pus is coming from a wound.  You develop a fever or as directed by your health care provider.  You notice a bad smell coming from an ulcer or wound.   This information is not intended to replace advice given to you by your health care provider. Make sure you discuss any questions you have with your health care provider.   Document Released: 04/03/2000 Document Revised: 12/07/2012 Document Reviewed: 09/13/2012 Elsevier Interactive Patient Education 2016 [employer]  

## 2015-04-04 NOTE — Progress Notes (Signed)
Patient ID: Jacob Pittman, male   DOB: 08-25-1945, 69 y.o.   MRN: [id number]  Subjective: Today this patient again presents for scheduled visit complaining of thick and elongated toenails complaining of discomfort with shoe wearing and walking and requests toenail debridement  Objective: No open skin lesions bilaterally The toenails are hypertrophic, elongated, brittle and tender to direct palpation 6-10  Assessment: Symptomatic onychomycoses 6-10 Type II diabetic with peripheral arterial disease and has been evaluated by Dr. Allyson Sabal  Plan: Debridement toenails 6-10 mechanically and electronically without any bleeding  Reappoint 3 months

## 2015-04-05 ENCOUNTER — Encounter: Payer: Self-pay | Admitting: Cardiology

## 2015-05-24 ENCOUNTER — Other Ambulatory Visit: Payer: Self-pay

## 2015-05-24 MED ORDER — SPIRONOLACTONE 25 MG PO TABS: 12.5000 mg | ORAL_TABLET | Freq: Every day | ORAL | Status: DC

## 2015-05-24 NOTE — Telephone Encounter (Signed)
Spironolactone refilled.

## 2015-05-24 NOTE — Telephone Encounter (Signed)
Rx request sent from [facility] for Spironlactone

## 2015-06-10 ENCOUNTER — Ambulatory Visit: Payer: Medicare Other | Admitting: Cardiology

## 2015-06-12 ENCOUNTER — Encounter: Payer: Self-pay | Admitting: *Deleted

## 2015-06-20 ENCOUNTER — Ambulatory Visit (INDEPENDENT_AMBULATORY_CARE_PROVIDER_SITE_OTHER): Payer: Medicare Other | Admitting: *Deleted

## 2015-06-20 DIAGNOSIS — I429 Cardiomyopathy, unspecified: Secondary | ICD-10-CM | POA: Diagnosis not present

## 2015-06-20 DIAGNOSIS — I428 Other cardiomyopathies: Secondary | ICD-10-CM

## 2015-06-20 NOTE — Progress Notes (Signed)
Remote ICD transmission.   

## 2015-07-03 ENCOUNTER — Ambulatory Visit: Payer: Medicare Other | Admitting: Podiatry

## 2015-07-12 ENCOUNTER — Encounter: Payer: Self-pay | Admitting: Cardiology

## 2015-07-12 LAB — CUP PACEART REMOTE DEVICE CHECK
HighPow Impedance: 74 Ohm
Implantable Lead Implant Date: 20141222
Lead Channel Pacing Threshold Amplitude: 0.6 V
Lead Channel Pacing Threshold Pulse Width: 0.4 ms
Lead Channel Setting Pacing Amplitude: 2 V
MDC IDC LEAD LOCATION: 753860
MDC IDC LEAD MODEL: 365501
MDC IDC LEAD SERIAL: 10570878
MDC IDC MSMT BATTERY VOLTAGE: 3.12 V
MDC IDC MSMT LEADCHNL RV IMPEDANCE VALUE: 450 Ohm
MDC IDC MSMT LEADCHNL RV SENSING INTR AMPL: 12.1 mV
MDC IDC PG SERIAL: 60733905
MDC IDC SESS DTM: 20170324141536
MDC IDC SET LEADCHNL RV PACING PULSEWIDTH: 0.4 ms
MDC IDC SET LEADCHNL RV SENSING SENSITIVITY: 0.8 mV
MDC IDC STAT BRADY RV PERCENT PACED: 9 %
Pulse Gen Model: 383594

## 2015-07-15 ENCOUNTER — Encounter: Payer: Self-pay | Admitting: Internal Medicine

## 2015-07-15 ENCOUNTER — Ambulatory Visit (INDEPENDENT_AMBULATORY_CARE_PROVIDER_SITE_OTHER): Payer: Medicare Other | Admitting: Internal Medicine

## 2015-07-15 ENCOUNTER — Other Ambulatory Visit: Payer: Self-pay | Admitting: Internal Medicine

## 2015-07-15 ENCOUNTER — Other Ambulatory Visit (INDEPENDENT_AMBULATORY_CARE_PROVIDER_SITE_OTHER): Payer: Medicare Other

## 2015-07-15 VITALS — BP 116/60 | HR 100 | Temp 98.8°F | Resp 20 | Wt 207.0 lb

## 2015-07-15 DIAGNOSIS — E785 Hyperlipidemia, unspecified: Secondary | ICD-10-CM | POA: Diagnosis not present

## 2015-07-15 DIAGNOSIS — E11622 Type 2 diabetes mellitus with other skin ulcer: Secondary | ICD-10-CM

## 2015-07-15 DIAGNOSIS — I5042 Chronic combined systolic (congestive) and diastolic (congestive) heart failure: Secondary | ICD-10-CM

## 2015-07-15 DIAGNOSIS — I10 Essential (primary) hypertension: Secondary | ICD-10-CM

## 2015-07-15 DIAGNOSIS — N320 Bladder-neck obstruction: Secondary | ICD-10-CM

## 2015-07-15 DIAGNOSIS — Z1159 Encounter for screening for other viral diseases: Secondary | ICD-10-CM

## 2015-07-15 DIAGNOSIS — Z86010 Personal history of colonic polyps: Secondary | ICD-10-CM

## 2015-07-15 LAB — CBC WITH DIFFERENTIAL/PLATELET
Basophils Absolute: 0 10*3/uL (ref 0.0–0.1)
Basophils Relative: 0.2 % (ref 0.0–3.0)
Eosinophils Absolute: 0.3 10*3/uL (ref 0.0–0.7)
Eosinophils Relative: 6 % — ABNORMAL HIGH (ref 0.0–5.0)
HCT: 38.3 % — ABNORMAL LOW (ref 39.0–52.0)
HEMOGLOBIN: 13.1 g/dL (ref 13.0–17.0)
LYMPHS ABS: 1.7 10*3/uL (ref 0.7–4.0)
LYMPHS PCT: 29.9 % (ref 12.0–46.0)
MCHC: 34.3 g/dL (ref 30.0–36.0)
MCV: 100 fl (ref 78.0–100.0)
Monocytes Absolute: 0.7 10*3/uL (ref 0.1–1.0)
Monocytes Relative: 12.7 % — ABNORMAL HIGH (ref 3.0–12.0)
Neutro Abs: 2.9 10*3/uL (ref 1.4–7.7)
Neutrophils Relative %: 51.2 % (ref 43.0–77.0)
Platelets: 198 10*3/uL (ref 150.0–400.0)
RBC: 3.83 Mil/uL — AB (ref 4.22–5.81)
RDW: 12.7 % (ref 11.5–15.5)
WBC: 5.8 10*3/uL (ref 4.0–10.5)

## 2015-07-15 LAB — HEPATIC FUNCTION PANEL
ALT: 13 U/L (ref 0–53)
AST: 15 U/L (ref 0–37)
Albumin: 4.3 g/dL (ref 3.5–5.2)
Alkaline Phosphatase: 60 U/L (ref 39–117)
BILIRUBIN DIRECT: 0.2 mg/dL (ref 0.0–0.3)
TOTAL PROTEIN: 7.3 g/dL (ref 6.0–8.3)
Total Bilirubin: 0.8 mg/dL (ref 0.2–1.2)

## 2015-07-15 LAB — URINALYSIS, ROUTINE W REFLEX MICROSCOPIC
BILIRUBIN URINE: NEGATIVE
HGB URINE DIPSTICK: NEGATIVE
KETONES UR: NEGATIVE
LEUKOCYTES UA: NEGATIVE
NITRITE: NEGATIVE
RBC / HPF: NONE SEEN (ref 0–?)
Specific Gravity, Urine: 1.02 (ref 1.000–1.030)
Total Protein, Urine: NEGATIVE
URINE GLUCOSE: NEGATIVE
Urobilinogen, UA: 1 (ref 0.0–1.0)
pH: 6 (ref 5.0–8.0)

## 2015-07-15 LAB — MICROALBUMIN / CREATININE URINE RATIO
CREATININE, U: 150.1 mg/dL
MICROALB UR: 1 mg/dL (ref 0.0–1.9)
Microalb Creat Ratio: 0.7 mg/g (ref 0.0–30.0)

## 2015-07-15 LAB — BASIC METABOLIC PANEL
BUN: 26 mg/dL — AB (ref 6–23)
CHLORIDE: 98 meq/L (ref 96–112)
CO2: 28 meq/L (ref 19–32)
CREATININE: 1.12 mg/dL (ref 0.40–1.50)
Calcium: 9.7 mg/dL (ref 8.4–10.5)
GFR: 82.92 mL/min (ref >60.00)
Glucose, Bld: 100 mg/dL — ABNORMAL HIGH (ref 70–99)
Potassium: 5 mEq/L (ref 3.5–5.1)
Sodium: 133 mEq/L — ABNORMAL LOW (ref 135–145)

## 2015-07-15 LAB — LIPID PANEL
Cholesterol: 200 mg/dL (ref 0–200)
HDL: 47.2 mg/dL (ref >39.00)
LDL Cholesterol: 130 mg/dL — ABNORMAL HIGH (ref 0–99)
NONHDL: 152.81
TRIGLYCERIDES: 113 mg/dL (ref 0.0–149.0)
Total CHOL/HDL Ratio: 4
VLDL: 22.6 mg/dL (ref 0.0–40.0)

## 2015-07-15 LAB — TSH: TSH: 1.27 u[IU]/mL (ref 0.35–4.50)

## 2015-07-15 LAB — HEMOGLOBIN A1C: HEMOGLOBIN A1C: 5.8 % (ref 4.6–6.5)

## 2015-07-15 LAB — HEPATITIS C ANTIBODY: HCV Ab: NEGATIVE

## 2015-07-15 LAB — PSA: PSA: 2.03 ng/mL (ref 0.10–4.00)

## 2015-07-15 MED ORDER — LOVASTATIN 40 MG PO TABS: 40.0000 mg | ORAL_TABLET | Freq: Every day | ORAL | Status: DC

## 2015-07-15 NOTE — Progress Notes (Signed)
Pre visit review using our clinic review tool, if applicable. No additional management support is needed unless otherwise documented below in the visit note. 

## 2015-07-15 NOTE — Assessment & Plan Note (Signed)
Also for psa as he is due, asympt,  to f/u any worsening symptoms or concerns 

## 2015-07-15 NOTE — Assessment & Plan Note (Signed)
stable overall by history and exam, recent data reviewed with pt, and pt to continue medical treatment as before,  to f/u any worsening symptoms or concerns BP Readings from Last 3 Encounters:  07/15/15 116/60  03/04/15 118/60  01/21/15 110/68

## 2015-07-15 NOTE — Assessment & Plan Note (Signed)
Due for f/u colonoscopy - for referral 

## 2015-07-15 NOTE — Patient Instructions (Addendum)
Please continue all other medications as before, and refills have been done if requested.  Please have the pharmacy call with any other refills you may need.  Please continue your efforts at being more active, low cholesterol diet, and weight control.  You are otherwise up to date with prevention measures today.  Please keep your appointments with your specialists as you may have planned  You will be contacted regarding the referral for: Dr Jordan/cardiology, colonoscopy  Please go to the LAB in the Basement (turn left off the elevator) for the tests to be done today  You will be contacted by phone if any changes need to be made immediately.  Otherwise, you will receive a letter about your results with an explanation, but please check with MyChart first.  Please remember to sign up for MyChart if you have not done so, as this will be important to you in the future with finding out test results, communicating by private email, and scheduling acute appointments online when needed.  Please return in 6 months, or sooner if needed

## 2015-07-15 NOTE — Assessment & Plan Note (Signed)
stable overall by history and exam, recent data reviewed with pt, and pt to continue medical treatment as before,  to f/u any worsening symptoms or concerns Lab Results  Component Value Date   LDLCALC 95 05/23/2014

## 2015-07-15 NOTE — Assessment & Plan Note (Signed)
Volume stable, cont same tx, f/u with card as planned

## 2015-07-15 NOTE — Assessment & Plan Note (Signed)
stable overall by history and exam, recent data reviewed with pt, and pt to continue medical treatment as before,  to f/u any worsening symptoms or concerns Lab Results  Component Value Date   HGBA1C 7.1* 05/23/2014

## 2015-07-15 NOTE — Progress Notes (Signed)
Subjective:    Patient ID: Jacob Pittman, male    DOB: 19-Sep-1945, 70 y.o.   MRN: [id number]  HPI  Here for yearly f/u; overall doing ok,  Pt denies chest pain, increasing sob or doe, wheezing, orthopnea, PND, increased LE swelling, palpitations, dizziness or syncope.  Pt denies new neurological symptoms such as new headache, or facial or extremity weakness or numbness.  Pt denies polydipsia, polyuria, or low sugar episode.   Pt denies new neurological symptoms such as new headache, or facial or extremity weakness or numbness.   Pt states overall good compliance with meds, mostly trying to follow appropriate diet, with wt overall stable,  but little exercise however. Due for cardiology f/u.  Due for colonoscopy and lab /fu. Wt Readings from Last 3 Encounters:  07/15/15 207 lb (93.895 kg)  03/04/15 206 lb 1.6 oz (93.486 kg)  01/21/15 210 lb 12.8 oz (95.618 kg)   Past Medical History  Diagnosis Date  . DIABETES MELLITUS, TYPE II 11/07/2008  . HYPERLIPIDEMIA 11/07/2008  . ANXIETY 02/04/2009  . HYPERTENSION 10/08/2008  . 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 OSTEOARTHRITIS, KNEE, RIGHT 10/08/2008  . HIP PAIN, RIGHT 11/06/2009  . Chronic systolic CHF (congestive heart failure) (HCC)   . Nonischemic cardiomyopathy [facility])     s/p ICD implantation 03-2013 by Dr Ladona Ridgel  . Peripheral arterial disease (HCC)   . CHF (congestive heart failure) [facility])    Past Surgical History  Procedure Laterality Date  . S/p left broken arm with pin      1980's  . Cardiac defibrillator placement Left 04/10/13    BTK single chamber ICD implanted by Dr Ladona Ridgel for primary prevention  . Implantable cardioverter defibrillator implant N/A 04/10/2013    Procedure: IMPLANTABLE CARDIOVERTER DEFIBRILLATOR IMPLANT;  Surgeon: Marinus Maw, MD;  Location: [facility] CATH LAB;  Service: Cardiovascular;  Laterality: N/A;    reports that he has quit smoking. He has never used smokeless tobacco. He reports that he does not drink  alcohol or use illicit drugs. family history includes Diabetes in his mother; Hypertension in his mother. No Known Allergies Current Outpatient Prescriptions on File Prior to Visit  Medication Sig Dispense Refill  . albuterol (PROVENTIL HFA;VENTOLIN HFA) 108 (90 BASE) MCG/ACT inhaler Inhale 2 puffs into the lungs every 6 (six) hours as needed for wheezing or shortness of breath. 1 Inhaler 5  . aspirin 81 MG EC tablet Take 81 mg by mouth daily.      . carvedilol (COREG) 12.5 MG tablet TAKE 1 TABLET (12.5 MG TOTAL) BY MOUTH 2 (TWO) TIMES DAILY. 60 tablet 11  . cetirizine (ZYRTEC) 10 MG tablet Take 1 tablet (10 mg total) by mouth daily. 30 tablet 11  . fluticasone (FLONASE) 50 MCG/ACT nasal spray Place 2 sprays into both nostrils daily. 16 g 2  . furosemide (LASIX) 40 MG tablet Take 2 tablets ( 80 mg ) twice a day 100 tablet 3  . lovastatin (MEVACOR) 20 MG tablet Take 1 tablet (20 mg total) by mouth at bedtime. 90 tablet 3  . metFORMIN (GLUCOPHAGE-XR) 500 MG 24 hr tablet TAKE 2 TABLETS BY MOUTH DAILY EVERY MORNING 180 tablet 1  . metolazone (ZAROXOLYN) 2.5 MG tablet Take 1 tablet (2.5 mg total) by mouth every other day. 30 tablet 6  . spironolactone (ALDACTONE) 25 MG tablet Take 0.5 tablets (12.5 mg total) by mouth daily. 30 tablet 6  . Tadalafil 2.5 MG TABS Take 1 tablet (2.5 mg total) by mouth daily as needed.  20 each 0   No current facility-administered medications on file prior to visit.   Review of Systems Constitutional: Negative for increased diaphoresis, other activity, appetite or siginficant weight change other than noted HENT: Negative for worsening hearing loss, ear pain, facial swelling, mouth sores and neck stiffness.   Eyes: Negative for other worsening pain, redness or visual disturbance.  Respiratory: Negative for shortness of breath and wheezing  Cardiovascular: Negative for chest pain and palpitations.  Gastrointestinal: Negative for diarrhea, blood in stool, abdominal  distention or other pain Genitourinary: Negative for hematuria, flank pain or change in urine volume.  Musculoskeletal: Negative for myalgias or other joint complaints.  Skin: Negative for color change and wound or drainage.  Neurological: Negative for syncope and numbness. other than noted Hematological: Negative for adenopathy. or other swelling Psychiatric/Behavioral: Negative for hallucinations, SI, self-injury, decreased concentration or other worsening agitation.      Objective:   Physical Exam BP 116/60 mmHg  Pulse 100  Temp(Src) 98.8 F (37.1 C) (Oral)  Resp 20  Wt 207 lb (93.895 kg)  SpO2 90% VS noted, not ill apperaing Constitutional: Pt is oriented to person, place, and time. Appears well-developed and well-nourished, in no significant distress Head: Normocephalic and atraumatic.  Right Ear: External ear normal.  Left Ear: External ear normal.  Nose: Nose normal.  Mouth/Throat: Oropharynx is clear and moist.  Eyes: Conjunctivae and EOM are normal. Pupils are equal, round, and reactive to light.  Neck: Normal range of motion. Neck supple. No JVD present. No tracheal deviation present or significant neck LA or mass Cardiovascular: Normal rate, regular rhythm, normal heart sounds and intact distal pulses.   Pulmonary/Chest: Effort normal and breath sounds without rales or wheezing  Abdominal: Soft. Bowel sounds are normal. NT. No HSM  Musculoskeletal: Normal range of motion. Exhibits no edema.  Lymphadenopathy:  Has no cervical adenopathy.  Neurological: Pt is alert and oriented to person, place, and time. Pt has normal reflexes. No cranial nerve deficit. Motor grossly intact Skin: Skin is warm and dry. No rash noted.  Psychiatric:  Has normal mood and affect. Behavior is normal.     Assessment & Plan:

## 2015-07-16 ENCOUNTER — Telehealth: Payer: Self-pay | Admitting: Internal Medicine

## 2015-07-16 NOTE — Telephone Encounter (Signed)
Inform pt of the lab result that was done on 07/15/15. Pt understood of lovastatin 40 mg daily. Pt also stated that his daughter will be calling back Jacob Pittman), pt stated ok to speak to her. FYI

## 2015-07-17 ENCOUNTER — Encounter: Payer: Self-pay | Admitting: Gastroenterology

## 2015-07-23 ENCOUNTER — Encounter: Payer: Self-pay | Admitting: Physician Assistant

## 2015-07-23 ENCOUNTER — Ambulatory Visit (INDEPENDENT_AMBULATORY_CARE_PROVIDER_SITE_OTHER): Payer: Medicare Other | Admitting: Physician Assistant

## 2015-07-23 VITALS — BP 110/62 | HR 68 | Ht 73.0 in | Wt 203.0 lb

## 2015-07-23 DIAGNOSIS — E785 Hyperlipidemia, unspecified: Secondary | ICD-10-CM

## 2015-07-23 DIAGNOSIS — I428 Other cardiomyopathies: Secondary | ICD-10-CM

## 2015-07-23 DIAGNOSIS — I5042 Chronic combined systolic (congestive) and diastolic (congestive) heart failure: Secondary | ICD-10-CM

## 2015-07-23 DIAGNOSIS — I739 Peripheral vascular disease, unspecified: Secondary | ICD-10-CM

## 2015-07-23 DIAGNOSIS — Z95810 Presence of automatic (implantable) cardiac defibrillator: Secondary | ICD-10-CM | POA: Diagnosis not present

## 2015-07-23 DIAGNOSIS — I429 Cardiomyopathy, unspecified: Secondary | ICD-10-CM

## 2015-07-23 DIAGNOSIS — I10 Essential (primary) hypertension: Secondary | ICD-10-CM | POA: Diagnosis not present

## 2015-07-23 DIAGNOSIS — L259 Unspecified contact dermatitis, unspecified cause: Secondary | ICD-10-CM

## 2015-07-23 NOTE — Progress Notes (Signed)
Patient ID: Jacob Pittman, male   DOB: 07-Mar-1946, 70 y.o.   MRN: [id number]    Date:  07/23/2015   ID:  Jacob Pittman, DOB 04/08/1946, MRN [id number]  PCP:  Oliver Barre, MD  Primary Cardiologist:  [place]  Chief Complaint  Patient presents with  . Follow-up    asymptomatic     History of Present Illness: Jacob Pittman is a 70 y.o. 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 EF of 15% despite optimal medical therapy. He is s/p ICD placement in December 2014. He also has a history of PAD. He was seen by Dr. Allyson Sabal but wasn't having symptoms so conservative therapy was recommended.   Jacob Pittman presents for six-month evaluation. He reports doing very well and looks good.  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  He continues to monitor his weight and it is stable. Dr. Jonny Ruiz recently put him on Lovastatin since his LDL was 130.  He does have a pruritic rash on the dorsal aspect of the left hand both hands and anterior left wrist.  He has had this for about a week.   He currently denies nausea, vomiting, fever, chest pain, shortness of breath, orthopnea, dizziness, PND, cough, congestion, abdominal pain, hematochezia, melena, lower extremity edema, claudication.  Wt Readings from Last 3 Encounters:  07/23/15 203 lb (92.08 kg)  07/15/15 207 lb (93.895 kg)  03/04/15 206 lb 1.6 oz (93.486 kg)     Past Medical History  Diagnosis Date  . DIABETES MELLITUS, TYPE II 11/07/2008  . HYPERLIPIDEMIA 11/07/2008  . ANXIETY 02/04/2009  . HYPERTENSION 10/08/2008  . 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 OSTEOARTHRITIS, KNEE, RIGHT 10/08/2008  . HIP PAIN, RIGHT 11/06/2009  . Chronic systolic CHF (congestive heart failure) (HCC)   . Nonischemic cardiomyopathy [facility])     s/p ICD implantation 03-2013 by Dr Ladona Ridgel  . Peripheral arterial disease (HCC)   . CHF (congestive heart failure) (HCC)      Current Outpatient Prescriptions  Medication Sig Dispense Refill  . albuterol (PROVENTIL HFA;VENTOLIN HFA) 108 (90 BASE) MCG/ACT inhaler Inhale 2 puffs into the lungs every 6 (six) hours as needed for wheezing or shortness of breath. 1 Inhaler 5  . aspirin 81 MG EC tablet Take 81 mg by mouth daily.      . carvedilol (COREG) 12.5 MG tablet TAKE 1 TABLET (12.5 MG TOTAL) BY MOUTH 2 (TWO) TIMES DAILY. 60 tablet 11  . cetirizine (ZYRTEC) 10 MG tablet Take 1 tablet (10 mg total) by mouth daily. 30 tablet 11  . fluticasone (FLONASE) 50 MCG/ACT nasal spray Place 2 sprays into both nostrils daily. 16 g 2  . furosemide (LASIX) 40 MG tablet Take 2 tablets ( 80 mg ) twice a day 100 tablet 3  . lovastatin (MEVACOR) 40 MG tablet Take 1 tablet (40 mg total) by mouth at bedtime. 90 tablet 3  . metFORMIN (GLUCOPHAGE-XR) 500 MG 24 hr tablet TAKE 2 TABLETS BY MOUTH DAILY EVERY MORNING 180 tablet 1  . metolazone (ZAROXOLYN) 2.5 MG tablet Take 1 tablet (2.5 mg total) by mouth every other day. 30 tablet 6  . spironolactone (ALDACTONE) 25 MG tablet Take 0.5 tablets (12.5 mg total) by mouth daily. 30 tablet 6  . Tadalafil 2.5 MG TABS Take 1 tablet (2.5 mg total) by mouth daily as needed. 20 each 0   No current facility-administered medications for this visit.    Allergies:   No  Known Allergies  Social History:  The patient  reports that he has quit smoking. He has never used smokeless tobacco. He reports that he does not drink alcohol or use illicit drugs.   Family history:   Family History  Problem Relation Age of Onset  . Diabetes Mother   . Hypertension Mother     ROS:  Please see the history of present illness.  All other systems reviewed and negative.   PHYSICAL EXAM: VS:  BP 110/62 mmHg  Pulse 68  Ht  (1.854 m)  Wt 203 lb (92.08 kg)  BMI 26.79 kg/m2 Well nourished, well developed, in no acute distress HEENT: Pupils are equal round react to light accommodation extraocular movements are  intact.  Neck: no JVDNo cervical lymphadenopathy. Cardiac: Regular rate and rhythm without murmurs rubs or gallops. Lungs:  clear to auscultation bilaterally, no wheezing, rhonchi or rales Abd: soft, nontender, positive bowel sounds all quadrants, no hepatosplenomegaly Ext: no lower extremity edema.  2+ radial and dorsalis pedis pulses. Skin: warm and dry.  Pruritic palpable rash on the dorsum of his hands and left wrist..  There are discrete 2mm pink lesions.   Neuro:  Grossly normal    ASSESSMENT AND PLAN:  Problem List Items Addressed This Visit    Peripheral arterial disease (HCC)   Nonischemic cardiomyopathy (HCC)   ICD (implantable cardioverter-defibrillator), single, in situ   Hyperlipidemia - Primary   Essential hypertension   Contact dermatitis   Chronic combined systolic and diastolic heart failure, NYHA class 3 (HCC)     Jacob Pittman presents for six-month evaluation. He reports doing very well and looks great. He appears euvolemic.  Blood pressure is well-controlled. His taken medications as prescribed which include Coreg 12 Mg twice a day, Lasix 80 mg twice a day, metolazone 2.5 every other day, spironolactone 12.5 mg daily. He was recently started on lovastatin for LDL of 130. His kidney function was also checked and good.  He appears to have a some chronic contact dermatitis on his hands and left wrist forearm. He does not recall getting into any, poison ivy or being bitten by anything. Says is very itchy. I recommended he get some calamine lotion and apply to the areas. If not improved in a week and see his primary doctor. Follow-up in 6 months.

## 2015-07-23 NOTE — Patient Instructions (Signed)
Medication Instructions:  Your physician recommends that you continue on your current medications as directed. Please refer to the Current Medication list given to you today.   Labwork: None ordered  Testing/Procedures: None ordered  Follow-Up: Your physician wants you to follow-up in: 6 months with Dr.Jordan  You will receive a reminder letter in the mail two months in advance. If you don't receive a letter, please call our office to schedule the follow-up appointment.   Any Other Special Instructions Will Be Listed Below (If Applicable). You can purchase Calamine lotion over the counter and apply to the rash on your hand a couple of times per day.    If you need a refill on your cardiac medications before your next appointment, please call your pharmacy.  Marland Kitchen

## 2015-08-09 ENCOUNTER — Other Ambulatory Visit: Payer: Self-pay | Admitting: Internal Medicine

## 2015-09-06 ENCOUNTER — Ambulatory Visit (INDEPENDENT_AMBULATORY_CARE_PROVIDER_SITE_OTHER): Payer: Medicare Other | Admitting: Gastroenterology

## 2015-09-06 ENCOUNTER — Encounter: Payer: Self-pay | Admitting: Gastroenterology

## 2015-09-06 VITALS — BP 130/80 | HR 72 | Ht 73.0 in | Wt 194.4 lb

## 2015-09-06 DIAGNOSIS — Z860100 Personal history of colon polyps, unspecified: Secondary | ICD-10-CM

## 2015-09-06 NOTE — Progress Notes (Signed)
Review of pertinent gastrointestinal problems: 1. Precancerous colon polyps, colonoscopy Dr. Sheryn Bison 2010 for screening. 3 small polyps were removed, 2 of them were hyperplastic and one was adenomatous. He was recommended to have repeat surveillance colonoscopy at five-year interval. 2. CHF, NYHA class III, ejection fraction 15%    HPI: This is a    very pleasant 70 year old man    who was referred to me by [name], MD  to evaluate  polyp surveillance, complicated heart history .    Chief complaint is precancerous colon polyp 2010, complicated cardiac history  Tends to have constipated bowels.  Has never tried fiber supplements.  Once with constipation he saw blood with wiping only.  He has an ejection fraction of about 15% and an implanted defibrillator. His cardiologist to have determined he has NYHA class III heart failure  No FH of colon cancer Review of systems: Pertinent positive and negative review of systems were noted in the above HPI section. Complete review of systems was performed and was otherwise normal.   Past Medical History  Diagnosis Date  . DIABETES MELLITUS, TYPE II 11/07/2008  . HYPERLIPIDEMIA 11/07/2008  . ANXIETY 02/04/2009  . HYPERTENSION 10/08/2008  . 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 OSTEOARTHRITIS, KNEE, RIGHT 10/08/2008  . HIP PAIN, RIGHT 11/06/2009  . Chronic systolic CHF (congestive heart failure) (HCC)   . Nonischemic cardiomyopathy [facility])     s/p ICD implantation 03-2013 by Dr Ladona Ridgel  . Peripheral arterial disease (HCC)   . CHF (congestive heart failure) [facility])     Past Surgical History  Procedure Laterality Date  . S/p left broken arm with pin      1980's  . Cardiac defibrillator placement Left 04/10/13    BTK single chamber ICD implanted by Dr Ladona Ridgel for primary prevention  . Implantable cardioverter defibrillator implant N/A 04/10/2013    Procedure: IMPLANTABLE CARDIOVERTER DEFIBRILLATOR IMPLANT;  Surgeon: Marinus Maw,  MD;  Location: [facility] CATH LAB;  Service: Cardiovascular;  Laterality: N/A;    Current Outpatient Prescriptions  Medication Sig Dispense Refill  . albuterol (PROVENTIL HFA;VENTOLIN HFA) 108 (90 BASE) MCG/ACT inhaler Inhale 2 puffs into the lungs every 6 (six) hours as needed for wheezing or shortness of breath. 1 Inhaler 5  . aspirin 81 MG EC tablet Take 81 mg by mouth daily.      . carvedilol (COREG) 12.5 MG tablet TAKE 1 TABLET (12.5 MG TOTAL) BY MOUTH 2 (TWO) TIMES DAILY. 60 tablet 11  . cetirizine (ZYRTEC) 10 MG tablet Take 1 tablet (10 mg total) by mouth daily. 30 tablet 11  . fluticasone (FLONASE) 50 MCG/ACT nasal spray Place 2 sprays into both nostrils daily. 16 g 2  . furosemide (LASIX) 40 MG tablet Take 2 tablets ( 80 mg ) twice a day 100 tablet 3  . lovastatin (MEVACOR) 40 MG tablet Take 1 tablet (40 mg total) by mouth at bedtime. 90 tablet 3  . metFORMIN (GLUCOPHAGE-XR) 500 MG 24 hr tablet TAKE TWO TABLETS BY MOUTH ONCE DAILY IN THE MORNING 180 tablet 0  . metolazone (ZAROXOLYN) 2.5 MG tablet Take 1 tablet (2.5 mg total) by mouth every other day. 30 tablet 6  . spironolactone (ALDACTONE) 25 MG tablet Take 0.5 tablets (12.5 mg total) by mouth daily. 30 tablet 6  . Tadalafil 2.5 MG TABS Take 1 tablet (2.5 mg total) by mouth daily as needed. 20 each 0   No current facility-administered medications for this visit.    Allergies as of  09/06/2015  . (No Known Allergies)    Family History  Problem Relation Age of Onset  . Diabetes Mother   . Hypertension Mother     Social History   Social History  . Marital Status: Legally Separated    Spouse Name: N/A  . Number of Children: 2  . Years of Education: N/A   Occupational History  . retired [employer]    Social History Main Topics  . Smoking status: Former Games developer  . Smokeless tobacco: Never Used  . Alcohol Use: No  . Drug Use: No  . Sexual Activity: No   Other Topics Concern  . Not on file   Social History Narrative    Married/separated although wife comes to appts. With him   10th grade education - poor reading and writing ability   Incarcerated for 6 months in 2009.     Physical Exam: BP 130/80 mmHg  Pulse 72  Ht  (1.854 m)  Wt 194 lb 6.4 oz (88.179 kg)  BMI 25.65 kg/m2 Constitutional: generally well-appearing Psychiatric: alert and oriented x3 Eyes: extraocular movements intact Mouth: oral pharynx moist, no lesions Neck: supple no lymphadenopathy Cardiovascular: heart regular rate and rhythm Lungs: clear to auscultation bilaterally Abdomen: soft, nontender, nondistended, no obvious ascites, no peritoneal signs, normal bowel sounds Extremities: no lower extremity edema bilaterally Skin: no lesions on visible extremities   Assessment and plan: 70 y.o. male with  personal history of precancerous colon polyp 2010  He has an ejection fraction of 15%, implanted defibrillator, NYHA class III congestive heart failure. I think it is best if we employ noninvasive testing first for polyp surveillance, colon cancer screening. I recommended Colo guard stool testing. If this is negative then I don't think he needs colon cancer screening in the future given his age and his comorbidities. If it is positive and he understands we should probably proceed with colonoscopy at his soonest convenience.   Rob Bunting, MD [facility] 09/06/2015, 9:52 AM  Cc: [name], MD

## 2015-09-06 NOTE — Patient Instructions (Addendum)
cologuard stool test.  If this is positive then will recommend colonoscopy. Please start taking citrucel (orange flavored) powder fiber supplement.  This may cause some bloating at first but that usually goes away. Begin with a small spoonful and work your way up to a large, heaping spoonful daily over a week.

## 2015-09-25 ENCOUNTER — Encounter: Payer: Medicare Other | Admitting: Gastroenterology

## 2015-10-16 ENCOUNTER — Encounter: Payer: Self-pay | Admitting: Podiatry

## 2015-10-16 ENCOUNTER — Ambulatory Visit (INDEPENDENT_AMBULATORY_CARE_PROVIDER_SITE_OTHER): Payer: Medicare Other | Admitting: Podiatry

## 2015-10-16 VITALS — BP 142/88 | HR 69 | Resp 12

## 2015-10-16 DIAGNOSIS — L89891 Pressure ulcer of other site, stage 1: Secondary | ICD-10-CM | POA: Diagnosis not present

## 2015-10-16 DIAGNOSIS — L97521 Non-pressure chronic ulcer of other part of left foot limited to breakdown of skin: Secondary | ICD-10-CM

## 2015-10-16 DIAGNOSIS — B351 Tinea unguium: Secondary | ICD-10-CM

## 2015-10-16 DIAGNOSIS — E1151 Type 2 diabetes mellitus with diabetic peripheral angiopathy without gangrene: Secondary | ICD-10-CM | POA: Diagnosis not present

## 2015-10-16 DIAGNOSIS — M79676 Pain in unspecified toe(s): Secondary | ICD-10-CM

## 2015-10-16 MED ORDER — SILVER SULFADIAZINE 1 % EX CREA: 1.0000 "application " | TOPICAL_CREAM | Freq: Every day | CUTANEOUS | Status: DC

## 2015-10-16 NOTE — Patient Instructions (Signed)
Apply a small amount of Silvadene cream to skin ulcer on your second left toe daily and cover with gauze Wear the surgical shoe on your left foot Limits standing and walking If you develop any sudden pain, swelling, redness, fever present to emergency department  Diabetes and Foot Care Diabetes may cause you to have problems because of poor blood supply (circulation) to your feet and legs. This may cause the skin on your feet to become thinner, break easier, and heal more slowly. Your skin may become dry, and the skin may peel and crack. You may also have nerve damage in your legs and feet causing decreased feeling in them. You may not notice minor injuries to your feet that could lead to infections or more serious problems. Taking care of your feet is one of the most important things you can do for yourself.  HOME CARE INSTRUCTIONS  Wear shoes at all times, even in the house. Do not go barefoot. Bare feet are easily injured.  Check your feet daily for blisters, cuts, and redness. If you cannot see the bottom of your feet, use a mirror or ask someone for help.  Wash your feet with warm water (do not use hot water) and mild soap. Then pat your feet and the areas between your toes until they are completely dry. Do not soak your feet as this can dry your skin.  Apply a moisturizing lotion or petroleum jelly (that does not contain alcohol and is unscented) to the skin on your feet and to dry, brittle toenails. Do not apply lotion between your toes.  Trim your toenails straight across. Do not dig under them or around the cuticle. File the edges of your nails with an emery board or nail file.  Do not cut corns or calluses or try to remove them with medicine.  Wear clean socks or stockings every day. Make sure they are not too tight. Do not wear knee-high stockings since they may decrease blood flow to your legs.  Wear shoes that fit properly and have enough cushioning. To break in new shoes, wear  them for just a few hours a day. This prevents you from injuring your feet. Always look in your shoes before you put them on to be sure there are no objects inside.  Do not cross your legs. This may decrease the blood flow to your feet.  If you find a minor scrape, cut, or break in the skin on your feet, keep it and the skin around it clean and dry. These areas may be cleansed with mild soap and water. Do not cleanse the area with peroxide, alcohol, or iodine.  When you remove an adhesive bandage, be sure not to damage the skin around it.  If you have a wound, look at it several times a day to make sure it is healing.  Do not use heating pads or hot water bottles. They may burn your skin. If you have lost feeling in your feet or legs, you may not know it is happening until it is too late.  Make sure your health care provider performs a complete foot exam at least annually or more often if you have foot problems. Report any cuts, sores, or bruises to your health care provider immediately. SEEK MEDICAL CARE IF:   You have an injury that is not healing.  You have cuts or breaks in the skin.  You have an ingrown nail.  You notice redness on your legs or feet.  You feel burning or tingling in your legs or feet.  You have pain or cramps in your legs and feet.  Your legs or feet are numb.  Your feet always feel cold. SEEK IMMEDIATE MEDICAL CARE IF:   There is increasing redness, swelling, or pain in or around a wound.  There is a red line that goes up your leg.  Pus is coming from a wound.  You develop a fever or as directed by your health care provider.  You notice a bad smell coming from an ulcer or wound.   This information is not intended to replace advice given to you by your health care provider. Make sure you discuss any questions you have with your health care provider.   Document Released: 04/03/2000 Document Revised: 12/07/2012 Document Reviewed: 09/13/2012 Elsevier  Interactive Patient Education [employer].

## 2015-10-16 NOTE — Progress Notes (Signed)
   Subjective:    Patient ID: Jacob Pittman, male    DOB: 1945-09-23, 70 y.o.   MRN: [id number]  HPI This patient presents today complaining of thickening elongated toenails walking wearing shoes and request toenail debridement. He also notices that in the past 4 weeks she's had a soreness second left toe that has had no self treatment or professional treatment .  Patient is a type II diabetic with a history of peripheral arterial disease that has been evaluated by Dr. Allyson Sabal   Review of Systems  Cardiovascular: Positive for leg swelling.  Skin: Positive for color change.       Objective:   Physical Exam  Orientated 3  Vascular: There is no calf pain or calf tenderness bilaterally DP pulses 2/4 bilaterally PT pulses 0/4 bilaterally Capillary reflex delayed bilaterally  Neurological: Sensation to 10 g monofilament wire intact 5/5 right 4/5 left Vibratory sensation reactive bilaterally Ankle reflex equal and reactive bilaterally  Dermatological 10 mm ulcer dorsal second left toe superficial with a green slime later. The ulcer is superficial and demonstrates no fatty or bony tissue. No erythema edema or active drainage surrounding the ulcer  Keratoses dorsal second right toe that remains closed after debridement The toenails are extremely elongated, brittle, discolored, deformed 6-10  Musculoskeletal: HAV deformity, bilaterally      Assessment & Plan:   Assessment: Type II diabetic with peripheral arterial disease Noninfected skin ulcer second left toe Symptomatic onychomycoses 6-10  Plan: I informed the patient that he had an ulcer on the second left toe and because he has compromised circulation this healing this wound could be difficult. The ulcer on the second left toe was debrided and dressed with Silvadene cream. Patient will continue apply Silvadene cream and a gauze dressing daily. Surgical shoe was dispensed to wear in the left foot Reappoint 7 days 4  follow-up and informed patient that if he developed any sudden fever, swelling, redness in the left foot to present to the emergency department  The toenails 6-10 were debrided mechanically and electrically without any bleeding  Patient was advised that if he develops any sudden pain, swelling, redness present to emergency department otherwise reappoint 7 days

## 2015-10-23 ENCOUNTER — Ambulatory Visit (INDEPENDENT_AMBULATORY_CARE_PROVIDER_SITE_OTHER): Payer: Medicare Other | Admitting: Podiatry

## 2015-10-23 ENCOUNTER — Encounter: Payer: Self-pay | Admitting: Podiatry

## 2015-10-23 VITALS — BP 105/79 | HR 64 | Temp 96.8°F | Resp 12

## 2015-10-23 DIAGNOSIS — L97521 Non-pressure chronic ulcer of other part of left foot limited to breakdown of skin: Secondary | ICD-10-CM

## 2015-10-23 DIAGNOSIS — L89891 Pressure ulcer of other site, stage 1: Secondary | ICD-10-CM

## 2015-10-23 MED ORDER — CEPHALEXIN 500 MG PO CAPS: 500.0000 mg | ORAL_CAPSULE | Freq: Three times a day (TID) | ORAL | Status: DC

## 2015-10-23 NOTE — Patient Instructions (Signed)
Apply Silvadene cream daily to the second left toe cover with gauze Wear surgical shoe on left foot Taking antibiotics by mouth 1 capsule 3 times a day 7 days If you develop any sudden pain, fever, swelling, drainage present to the emergency room  Diabetes and Foot Care Diabetes may cause you to have problems because of poor blood supply (circulation) to your feet and legs. This may cause the skin on your feet to become thinner, break easier, and heal more slowly. Your skin may become dry, and the skin may peel and crack. You may also have nerve damage in your legs and feet causing decreased feeling in them. You may not notice minor injuries to your feet that could lead to infections or more serious problems. Taking care of your feet is one of the most important things you can do for yourself.  HOME CARE INSTRUCTIONS  Wear shoes at all times, even in the house. Do not go barefoot. Bare feet are easily injured.  Check your feet daily for blisters, cuts, and redness. If you cannot see the bottom of your feet, use a mirror or ask someone for help.  Wash your feet with warm water (do not use hot water) and mild soap. Then pat your feet and the areas between your toes until they are completely dry. Do not soak your feet as this can dry your skin.  Apply a moisturizing lotion or petroleum jelly (that does not contain alcohol and is unscented) to the skin on your feet and to dry, brittle toenails. Do not apply lotion between your toes.  Trim your toenails straight across. Do not dig under them or around the cuticle. File the edges of your nails with an emery board or nail file.  Do not cut corns or calluses or try to remove them with medicine.  Wear clean socks or stockings every day. Make sure they are not too tight. Do not wear knee-high stockings since they may decrease blood flow to your legs.  Wear shoes that fit properly and have enough cushioning. To break in new shoes, wear them for just a  few hours a day. This prevents you from injuring your feet. Always look in your shoes before you put them on to be sure there are no objects inside.  Do not cross your legs. This may decrease the blood flow to your feet.  If you find a minor scrape, cut, or break in the skin on your feet, keep it and the skin around it clean and dry. These areas may be cleansed with mild soap and water. Do not cleanse the area with peroxide, alcohol, or iodine.  When you remove an adhesive bandage, be sure not to damage the skin around it.  If you have a wound, look at it several times a day to make sure it is healing.  Do not use heating pads or hot water bottles. They may burn your skin. If you have lost feeling in your feet or legs, you may not know it is happening until it is too late.  Make sure your health care provider performs a complete foot exam at least annually or more often if you have foot problems. Report any cuts, sores, or bruises to your health care provider immediately. SEEK MEDICAL CARE IF:   You have an injury that is not healing.  You have cuts or breaks in the skin.  You have an ingrown nail.  You notice redness on your legs or feet.  You feel burning or tingling in your legs or feet.  You have pain or cramps in your legs and feet.  Your legs or feet are numb.  Your feet always feel cold. SEEK IMMEDIATE MEDICAL CARE IF:   There is increasing redness, swelling, or pain in or around a wound.  There is a red line that goes up your leg.  Pus is coming from a wound.  You develop a fever or as directed by your health care provider.  You notice a bad smell coming from an ulcer or wound.   This information is not intended to replace advice given to you by your health care provider. Make sure you discuss any questions you have with your health care provider.   Document Released: 04/03/2000 Document Revised: 12/07/2012 Document Reviewed: 09/13/2012 Elsevier Interactive  Patient Education [employer].

## 2015-10-24 NOTE — Progress Notes (Signed)
Patient ID: Jacob Pittman, male   DOB: 1945/04/22, 70 y.o.   MRN: [id number]  Subjective: This patient presents today for follow-up visit of 10/16/2015 for a superficial 10 mm skin ulcer on the dorsal second left toe. Patient is currently applying Silvadene cream and a gauze dressing and wearing the surgical shoe. Patient is a known type II diabetic with peripheral arterial disease  Objective:  Orientated 3  Vascular: There is no calf pain or calf tenderness bilaterally DP pulses 2/4 bilaterally PT pulses 0/4 bilaterally Capillary reflex delayed bilaterally  Neurological: Sensation to 10 g monofilament wire intact 5/5 right 4/5 left Vibratory sensation reactive bilaterally Ankle reflex equal and reactive bilaterally  Dermatological 3 mm superficial ulcer dorsal second left toe   with crusting and a granular base: with  erythema , edema surrounding the ulcer. There is no active drainage or warmth in the second left toe  Musculoskeletal: HAV deformities bilaterally  Assessment: Type II diabetic with peripheral arterial disease Ulceration cellulitis second toe left foot  Plan: Debrided ulcer second left toe and apply Silvadene dressing Maintain Silvadene dressing daily Rx cephalexin 500 mg by mouth 3 times a day 7 days, 1 refill Wear surgical shoe on the left foot Patient instructed that if he notice any sudden pain, swelling, redness, fever presents to the emergency department  Reappoint 7 days

## 2015-10-30 ENCOUNTER — Encounter: Payer: Self-pay | Admitting: Podiatry

## 2015-10-30 ENCOUNTER — Ambulatory Visit (INDEPENDENT_AMBULATORY_CARE_PROVIDER_SITE_OTHER): Payer: Medicare Other | Admitting: Podiatry

## 2015-10-30 VITALS — BP 105/60 | HR 72 | Resp 12

## 2015-10-30 DIAGNOSIS — L97521 Non-pressure chronic ulcer of other part of left foot limited to breakdown of skin: Secondary | ICD-10-CM

## 2015-10-30 DIAGNOSIS — L89891 Pressure ulcer of other site, stage 1: Secondary | ICD-10-CM

## 2015-10-30 NOTE — Patient Instructions (Signed)
Complete all previous prescribed antibiotics Apply Silvadene cream to skin ulcer daily and cover with gauze Wear the surgical shoe on the left foot If you develop any sudden pain, swelling, redness, fever present to the emergency department  Diabetes and Foot Care Diabetes may cause you to have problems because of poor blood supply (circulation) to your feet and legs. This may cause the skin on your feet to become thinner, break easier, and heal more slowly. Your skin may become dry, and the skin may peel and crack. You may also have nerve damage in your legs and feet causing decreased feeling in them. You may not notice minor injuries to your feet that could lead to infections or more serious problems. Taking care of your feet is one of the most important things you can do for yourself.  HOME CARE INSTRUCTIONS  Wear shoes at all times, even in the house. Do not go barefoot. Bare feet are easily injured.  Check your feet daily for blisters, cuts, and redness. If you cannot see the bottom of your feet, use a mirror or ask someone for help.  Wash your feet with warm water (do not use hot water) and mild soap. Then pat your feet and the areas between your toes until they are completely dry. Do not soak your feet as this can dry your skin.  Apply a moisturizing lotion or petroleum jelly (that does not contain alcohol and is unscented) to the skin on your feet and to dry, brittle toenails. Do not apply lotion between your toes.  Trim your toenails straight across. Do not dig under them or around the cuticle. File the edges of your nails with an emery board or nail file.  Do not cut corns or calluses or try to remove them with medicine.  Wear clean socks or stockings every day. Make sure they are not too tight. Do not wear knee-high stockings since they may decrease blood flow to your legs.  Wear shoes that fit properly and have enough cushioning. To break in new shoes, wear them for just a few  hours a day. This prevents you from injuring your feet. Always look in your shoes before you put them on to be sure there are no objects inside.  Do not cross your legs. This may decrease the blood flow to your feet.  If you find a minor scrape, cut, or break in the skin on your feet, keep it and the skin around it clean and dry. These areas may be cleansed with mild soap and water. Do not cleanse the area with peroxide, alcohol, or iodine.  When you remove an adhesive bandage, be sure not to damage the skin around it.  If you have a wound, look at it several times a day to make sure it is healing.  Do not use heating pads or hot water bottles. They may burn your skin. If you have lost feeling in your feet or legs, you may not know it is happening until it is too late.  Make sure your health care provider performs a complete foot exam at least annually or more often if you have foot problems. Report any cuts, sores, or bruises to your health care provider immediately. SEEK MEDICAL CARE IF:   You have an injury that is not healing.  You have cuts or breaks in the skin.  You have an ingrown nail.  You notice redness on your legs or feet.  You feel burning or tingling in  your legs or feet.  You have pain or cramps in your legs and feet.  Your legs or feet are numb.  Your feet always feel cold. SEEK IMMEDIATE MEDICAL CARE IF:   There is increasing redness, swelling, or pain in or around a wound.  There is a red line that goes up your leg.  Pus is coming from a wound.  You develop a fever or as directed by your health care provider.  You notice a bad smell coming from an ulcer or wound.   This information is not intended to replace advice given to you by your health care provider. Make sure you discuss any questions you have with your health care provider.   Document Released: 04/03/2000 Document Revised: 12/07/2012 Document Reviewed: 09/13/2012 Elsevier Interactive Patient  Education [employer]

## 2015-10-30 NOTE — Progress Notes (Signed)
Patient ID: Jacob Pittman, male   DOB: [birth year], 70 y.o.   MRN: [id number]   Subjective: This patient presents today for follow-up visit of 10/16/2015 for a superficial with the initial size 10 mm skin ulcer on the dorsal second left toe. Patient is currently applying Silvadene cream and a gauze dressing and wearing the surgical shoe. On the visit of 10/23/2015 cephalexin 500 mg by mouth 3 times a day 7 days was prescribed and patient currently using the medication has several doses left without any complaint from medication Patient is a known type II diabetic with peripheral arterial disease Patient is also requesting diabetic shoes  Objective:  Orientated 3  Vascular: There is no calf pain or calf tenderness bilaterally DP pulses 2/4 bilaterally PT pulses 0/4 bilaterally Capillary reflex delayed bilaterally  Neurological: Sensation to 10 g monofilament wire intact 5/5 right 4/5 left Vibratory sensation reactive bilaterally Ankle reflex equal and reactive bilaterally  Dermatological 3 mm superficial ulcer second left toewith slight scaling about the ulcer site. The base of the ulcers granular and superficial and does not probe deep. There is no erythema, edema, warmth in the second left toe   Musculoskeletal: HAV deformities bilaterally  Assessment: Type II diabetic with peripheral arterial disease Ulceration  second toe left foot Resolved cellulitis second left toe  Plan: Debrided ulcer second left toe and apply Silvadene dressing Maintain Silvadene dressing daily Complete the prescription for cephalexin 500 mg by mouth 3 times a day x7 and previous visit and do not refill Wear surgical shoe on the left foot Patient instructed that if he notice any sudden pain, swelling, redness, fever presents to the emergency department  Obtain certification for diabetic shoes for the indication of: Diabetic with circulatory complications Hallux valgus bilaterally Absent  posterior tibial pulses bilaterally Increase capillary refill time  Reappoint 14 days

## 2015-11-08 ENCOUNTER — Telehealth: Payer: Self-pay

## 2015-11-08 NOTE — Telephone Encounter (Signed)
Pt's daughter returned call. She states she will remind her dad to complete the cologuard. She thinks he must have just forgotten.

## 2015-11-08 NOTE — Telephone Encounter (Signed)
Left message to return call 

## 2015-11-08 NOTE — Telephone Encounter (Signed)
Ok, can you please contact him and ask if he is interested in this test which we talked about at his last visit

## 2015-11-08 NOTE — Telephone Encounter (Signed)
Cologuard order from 09-06-2015 has been cancelled by [employer] for inactivity.

## 2015-11-20 ENCOUNTER — Ambulatory Visit: Payer: Medicare Other | Admitting: Podiatry

## 2015-11-26 ENCOUNTER — Ambulatory Visit: Payer: Medicare Other | Admitting: Podiatry

## 2015-12-18 ENCOUNTER — Ambulatory Visit: Payer: Medicare Other | Admitting: Podiatry

## 2016-01-01 ENCOUNTER — Ambulatory Visit (INDEPENDENT_AMBULATORY_CARE_PROVIDER_SITE_OTHER): Payer: Medicare Other | Admitting: Podiatry

## 2016-01-01 ENCOUNTER — Encounter: Payer: Self-pay | Admitting: Podiatry

## 2016-01-01 VITALS — BP 136/80 | HR 71 | Temp 96.7°F | Resp 14

## 2016-01-01 DIAGNOSIS — M79676 Pain in unspecified toe(s): Secondary | ICD-10-CM

## 2016-01-01 DIAGNOSIS — L84 Corns and callosities: Secondary | ICD-10-CM

## 2016-01-01 DIAGNOSIS — E1151 Type 2 diabetes mellitus with diabetic peripheral angiopathy without gangrene: Secondary | ICD-10-CM

## 2016-01-01 DIAGNOSIS — B351 Tinea unguium: Secondary | ICD-10-CM

## 2016-01-02 NOTE — Patient Instructions (Signed)
Diabetes and Foot Care Diabetes may cause you to have problems because of poor blood supply (circulation) to your feet and legs. This may cause the skin on your feet to become thinner, break easier, and heal more slowly. Your skin may become dry, and the skin may peel and crack. You may also have nerve damage in your legs and feet causing decreased feeling in them. You may not notice minor injuries to your feet that could lead to infections or more serious problems. Taking care of your feet is one of the most important things you can do for yourself.  HOME CARE INSTRUCTIONS  Wear shoes at all times, even in the house. Do not go barefoot. Bare feet are easily injured.  Check your feet daily for blisters, cuts, and redness. If you cannot see the bottom of your feet, use a mirror or ask someone for help.  Wash your feet with warm water (do not use hot water) and mild soap. Then pat your feet and the areas between your toes until they are completely dry. Do not soak your feet as this can dry your skin.  Apply a moisturizing lotion or petroleum jelly (that does not contain alcohol and is unscented) to the skin on your feet and to dry, brittle toenails. Do not apply lotion between your toes.  Trim your toenails straight across. Do not dig under them or around the cuticle. File the edges of your nails with an emery board or nail file.  Do not cut corns or calluses or try to remove them with medicine.  Wear clean socks or stockings every day. Make sure they are not too tight. Do not wear knee-high stockings since they may decrease blood flow to your legs.  Wear shoes that fit properly and have enough cushioning. To break in new shoes, wear them for just a few hours a day. This prevents you from injuring your feet. Always look in your shoes before you put them on to be sure there are no objects inside.  Do not cross your legs. This may decrease the blood flow to your feet.  If you find a minor scrape,  cut, or break in the skin on your feet, keep it and the skin around it clean and dry. These areas may be cleansed with mild soap and water. Do not cleanse the area with peroxide, alcohol, or iodine.  When you remove an adhesive bandage, be sure not to damage the skin around it.  If you have a wound, look at it several times a day to make sure it is healing.  Do not use heating pads or hot water bottles. They may burn your skin. If you have lost feeling in your feet or legs, you may not know it is happening until it is too late.  Make sure your health care provider performs a complete foot exam at least annually or more often if you have foot problems. Report any cuts, sores, or bruises to your health care provider immediately. SEEK MEDICAL CARE IF:   You have an injury that is not healing.  You have cuts or breaks in the skin.  You have an ingrown nail.  You notice redness on your legs or feet.  You feel burning or tingling in your legs or feet.  You have pain or cramps in your legs and feet.  Your legs or feet are numb.  Your feet always feel cold. SEEK IMMEDIATE MEDICAL CARE IF:   There is increasing redness,   swelling, or pain in or around a wound.  There is a red line that goes up your leg.  Pus is coming from a wound.  You develop a fever or as directed by your health care provider.  You notice a bad smell coming from an ulcer or wound.   This information is not intended to replace advice given to you by your health care provider. Make sure you discuss any questions you have with your health care provider.   Document Released: 04/03/2000 Document Revised: 12/07/2012 Document Reviewed: 09/13/2012 Elsevier Interactive Patient Education 2016 [employer]  

## 2016-01-02 NOTE — Progress Notes (Signed)
Patient ID: Jacob Pittman, male   DOB: 1945-04-25, 70 y.o.   MRN: [id number]   Subjective: This patient presents today complaining of elongated and thickened toenails which are uncomfortable and walking wearing shoes and request toenail debridement. This patient was last evaluated on the visit of 10/30/2015 for an ulceration and cellulitis on the second left toe. At that time local wound care and antibiotics were prescribed. Patient was instructed return 1 week from that visit, however, does not present until today. He states that the wound has healed and he had no other treatment after the visit of 10/30/2015.  Patient is diabetic with a history of peripheral arterial disease  Objective: Orientated 3 DP 2/4 bilaterally PT pulses 0/4 bilaterally Capillary reflex delayed bilaterally  Neurological: Sensation to 10 g monofilament or intact 5/5 right 4/5 left Vibratory sensation reactive bilaterally Ankle reflex equal and reactive bilaterally  Dermatological: Large corns medial proximal interphalangeal joint second toes bilaterally that remain closed after debridement The toenails elongated, brittle, deformed, discolored and tract palpation 6-10  Musculoskeletal: HAV bilaterally  Assessment: Diabetic with peripheral arterial disease Protective sensation intact bilaterally Healed ulceration second left toe Corns 2 Symptomatic iconic toenails 6-10  Plan: Debridement toenails 6-10 mechanically and legs without any bleeding Debrided keratoses 2 without any bleeding  Reappoint times seem

## 2016-01-10 ENCOUNTER — Other Ambulatory Visit: Payer: Self-pay | Admitting: Cardiology

## 2016-01-15 ENCOUNTER — Ambulatory Visit: Payer: Medicare Other | Admitting: Internal Medicine

## 2016-01-15 ENCOUNTER — Telehealth: Payer: Self-pay | Admitting: Emergency Medicine

## 2016-01-15 NOTE — Telephone Encounter (Signed)
Pt missed his 6 mo fu appt on 9/27. Would you like me to call him and reschedule?

## 2016-01-15 NOTE — Telephone Encounter (Signed)
Ok to reschedule

## 2016-01-16 NOTE — Telephone Encounter (Signed)
Called patient and let him know he missed his appt. Told him to call back to reschedule.

## 2016-02-06 ENCOUNTER — Ambulatory Visit: Payer: Medicare Other | Admitting: Internal Medicine

## 2016-02-07 ENCOUNTER — Encounter: Payer: Self-pay | Admitting: Internal Medicine

## 2016-02-07 ENCOUNTER — Ambulatory Visit (INDEPENDENT_AMBULATORY_CARE_PROVIDER_SITE_OTHER): Payer: Medicare Other | Admitting: Internal Medicine

## 2016-02-07 VITALS — BP 138/80 | HR 88 | Temp 98.8°F | Resp 20 | Wt 210.0 lb

## 2016-02-07 DIAGNOSIS — E785 Hyperlipidemia, unspecified: Secondary | ICD-10-CM

## 2016-02-07 DIAGNOSIS — E11622 Type 2 diabetes mellitus with other skin ulcer: Secondary | ICD-10-CM | POA: Diagnosis not present

## 2016-02-07 DIAGNOSIS — R972 Elevated prostate specific antigen [PSA]: Secondary | ICD-10-CM | POA: Insufficient documentation

## 2016-02-07 DIAGNOSIS — I10 Essential (primary) hypertension: Secondary | ICD-10-CM | POA: Diagnosis not present

## 2016-02-07 NOTE — Assessment & Plan Note (Signed)
D/w pt, asympt but suspicious for malignancy, declines f/u lab today, promises to return tomorrow,  to f/u any worsening symptoms or concerns

## 2016-02-07 NOTE — Assessment & Plan Note (Signed)
stable overall by history and exam, recent data reviewed with pt, and pt to continue medical treatment as before,  to f/u any worsening symptoms or concerns Lab Results  Component Value Date   HGBA1C 5.8 07/15/2015

## 2016-02-07 NOTE — Assessment & Plan Note (Signed)
stable overall by history and exam, recent data reviewed with pt, declines further med tx such as change of statin , goal ldl < 70, and pt to continue medical treatment as before,  to f/u any worsening symptoms or concerns Lab Results  Component Value Date   LDLCALC 130 (H) 07/15/2015

## 2016-02-07 NOTE — Progress Notes (Signed)
Pre visit review using our clinic review tool, if applicable. No additional management support is needed unless otherwise documented below in the visit note. 

## 2016-02-07 NOTE — Patient Instructions (Signed)
Please continue all other medications as before, and refills have been done if requested.  Please have the pharmacy call with any other refills you may need.  Please continue your efforts at being more active, low cholesterol diet, and weight control.  You are otherwise up to date with prevention measures today.  Please keep your appointments with your specialists as you may have planned  Please go to the LAB in the Basement (turn left off the elevator) for the tests to be done as you can  You will be contacted by phone if any changes need to be made immediately.  Otherwise, you will receive a letter about your results with an explanation, but please check with MyChart first.  Please remember to sign up for MyChart if you have not done so, as this will be important to you in the future with finding out test results, communicating by private email, and scheduling acute appointments online when needed.  Please return in 6 months, or sooner if needed

## 2016-02-07 NOTE — Progress Notes (Signed)
Subjective:    Patient ID: Jacob DarnerJerry Stelzner, male    DOB: 1945/08/29, 70 y.o.   MRN: [id number]  HPI  Here to f/u; overall doing ok,  Pt denies chest pain, increasing sob or doe, wheezing, orthopnea, PND, increased LE swelling, palpitations, dizziness or syncope.  Pt denies new neurological symptoms such as new headache, or facial or extremity weakness or numbness.  Pt denies polydipsia, polyuria, or low sugar episode.   Pt denies new neurological symptoms such as new headache, or facial or extremity weakness or numbness.   Pt states overall good compliance with meds, mostly trying to follow appropriate diet, with wt overall stable,  but little exercise however. Has not changed lifestyle.  Denies urinary symptoms such as dysuria, frequency, urgency, flank pain, hematuria or n/v, fever, chills. Has elev PSA last visit. Had declined urology eval last visit.  Declines lab tests today since "I had 2 beers."  States will return tomorrow for labs Past Medical History:  Diagnosis Date  . ANXIETY 02/04/2009  . CHF (congestive heart failure) (HCC)   . Chronic systolic CHF (congestive heart failure) (HCC)   . DIABETES MELLITUS, TYPE II 11/07/2008  . 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 HIP PAIN, RIGHT 11/06/2009  . HYPERLIPIDEMIA 11/07/2008  . HYPERTENSION 10/08/2008  . Nonischemic cardiomyopathy [facility](HCC)    s/p ICD implantation 03-2013 by Dr Ladona Ridgelaylor  . OSTEOARTHRITIS, KNEE, RIGHT 10/08/2008  . Peripheral arterial disease [facility](HCC)    Past Surgical History:  Procedure Laterality Date  . CARDIAC DEFIBRILLATOR PLACEMENT Left 04/10/13   BTK single chamber ICD implanted by Dr Ladona Ridgelaylor for primary prevention  . IMPLANTABLE CARDIOVERTER DEFIBRILLATOR IMPLANT N/A 04/10/2013   Procedure: IMPLANTABLE CARDIOVERTER DEFIBRILLATOR IMPLANT;  Surgeon: Marinus MawGregg W Taylor, MD;  [facility] LAB;  Service: Cardiovascular;  Laterality: N/A;  . s/p left broken arm with pin     1980's    reports that he has quit smoking. He  has never used smokeless tobacco. He reports that he does not drink alcohol or use drugs. family history includes Diabetes in his mother; Hypertension in his mother. No Known Allergies Current Outpatient Prescriptions on File Prior to Visit  Medication Sig Dispense Refill  . albuterol (PROVENTIL HFA;VENTOLIN HFA) 108 (90 BASE) MCG/ACT inhaler Inhale 2 puffs into the lungs every 6 (six) hours as needed for wheezing or shortness of breath. 1 Inhaler 5  . aspirin 81 MG EC tablet Take 81 mg by mouth daily.      . carvedilol (COREG) 12.5 MG tablet TAKE 1 TABLET (12.5 MG TOTAL) BY MOUTH 2 (TWO) TIMES DAILY. 60 tablet 11  . cetirizine (ZYRTEC) 10 MG tablet Take 1 tablet (10 mg total) by mouth daily. 30 tablet 11  . fluticasone (FLONASE) 50 MCG/ACT nasal spray Place 2 sprays into both nostrils daily. 16 g 2  . furosemide (LASIX) 40 MG tablet Take 2 tablets ( 80 mg ) twice a day 100 tablet 3  . lovastatin (MEVACOR) 40 MG tablet Take 1 tablet (40 mg total) by mouth at bedtime. 90 tablet 3  . metFORMIN (GLUCOPHAGE-XR) 500 MG 24 hr tablet TAKE TWO TABLETS BY MOUTH ONCE DAILY IN THE MORNING 180 tablet 0  . metolazone (ZAROXOLYN) 2.5 MG tablet TAKE ONE TABLET BY MOUTH EVERY OTHER DAY 15 tablet 11  . silver sulfADIAZINE (SILVADENE) 1 % cream Apply 1 application topically daily. 50 g 0  . spironolactone (ALDACTONE) 25 MG tablet Take 0.5 tablets (12.5 mg total) by mouth daily. 30 tablet 6  .  Tadalafil 2.5 MG TABS Take 1 tablet (2.5 mg total) by mouth daily as needed. 20 each 0   No current facility-administered medications on file prior to visit.     Review of Systems  Constitutional: Negative for unusual diaphoresis or night sweats HENT: Negative for ear swelling or discharge Eyes: Negative for worsening visual haziness  Respiratory: Negative for choking and stridor.   Gastrointestinal: Negative for distension or worsening eructation Genitourinary: Negative for retention or change in urine volume. No slow  flow Musculoskeletal: Negative for other MSK pain or swelling Skin: Negative for color change and worsening wound Neurological: Negative for tremors and numbness other than noted  Psychiatric/Behavioral: Negative for decreased concentration or agitation other than above       Objective:   Physical Exam BP 138/80   Pulse 88   Temp 98.8 F (37.1 C) (Oral)   Resp 20   Wt 210 lb (95.3 kg)   SpO2 97%   BMI 27.71 kg/m  VS noted,  Constitutional: Pt appears in no apparent distress HENT: Head: NCAT.  Right Ear: External ear normal.  Left Ear: External ear normal.  Eyes: . Pupils are equal, round, and reactive to light. Conjunctivae and EOM are normal Neck: Normal range of motion. Neck supple.  Cardiovascular: Normal rate and regular rhythm.   Pulmonary/Chest: Effort normal and breath sounds without rales or wheezing.  Abd:  Soft, NT, ND, + BS Neurological: Pt is alert. Not confused , motor grossly intact Skin: Skin is warm. No rash, no LE edema Psychiatric: Pt behavior is normal. No agitation. mild nervous    Assessment & Plan:

## 2016-02-07 NOTE — Assessment & Plan Note (Signed)
stable overall by history and exam, recent data reviewed with pt, and pt to continue medical treatment as before,  to f/u any worsening symptoms or concerns BP Readings from Last 3 Encounters:  02/07/16 138/80  01/01/16 136/80  10/30/15 105/60

## 2016-03-03 ENCOUNTER — Other Ambulatory Visit: Payer: Self-pay | Admitting: Cardiology

## 2016-03-03 NOTE — Telephone Encounter (Signed)
REFILL 

## 2016-03-05 ENCOUNTER — Other Ambulatory Visit: Payer: Self-pay | Admitting: Cardiology

## 2016-03-05 ENCOUNTER — Other Ambulatory Visit: Payer: Self-pay

## 2016-03-18 ENCOUNTER — Other Ambulatory Visit: Payer: Self-pay | Admitting: Internal Medicine

## 2016-04-08 ENCOUNTER — Ambulatory Visit (INDEPENDENT_AMBULATORY_CARE_PROVIDER_SITE_OTHER): Payer: Medicare Other | Admitting: Podiatry

## 2016-04-08 ENCOUNTER — Encounter: Payer: Self-pay | Admitting: Podiatry

## 2016-04-08 VITALS — BP 119/69 | HR 91 | Resp 18

## 2016-04-08 DIAGNOSIS — E1151 Type 2 diabetes mellitus with diabetic peripheral angiopathy without gangrene: Secondary | ICD-10-CM

## 2016-04-08 DIAGNOSIS — M79676 Pain in unspecified toe(s): Secondary | ICD-10-CM

## 2016-04-08 DIAGNOSIS — B351 Tinea unguium: Secondary | ICD-10-CM | POA: Diagnosis not present

## 2016-04-08 DIAGNOSIS — L84 Corns and callosities: Secondary | ICD-10-CM

## 2016-04-08 DIAGNOSIS — I739 Peripheral vascular disease, unspecified: Secondary | ICD-10-CM | POA: Diagnosis not present

## 2016-04-08 DIAGNOSIS — Q828 Other specified congenital malformations of skin: Secondary | ICD-10-CM | POA: Diagnosis not present

## 2016-04-08 NOTE — Patient Instructions (Signed)

## 2016-04-09 NOTE — Progress Notes (Signed)
Patient ID: Jacob Pittman, male   DOB: June 23, 1945, 70 y.o.   MRN: [id number]   Subjective: This patient presents today for scheduled visit complaining that his toenails are thickened and elongated and are comfortable walking wearing shoes and requests toenail debridement. Patient is history of diabetic wound that is healed in the past Patient is diabetic with history of peripheral arterial disease     Objective: Orientated 3 DP 2/4 bilaterally PT pulses 0/4 bilaterally Capillary reflex delayed bilaterally  Neurological: Sensation to 10 g monofilament or intact 5/5 right 4/5 left Vibratory sensation reactive bilaterally Ankle reflex equal and reactive bilaterally  Dermatological: No open skin lesions bilaterally Dry, atrophic skin bilaterally Large corns medial proximal interphalangeal joint second toes bilaterally that remain closed after debridement The toenails elongated, brittle, deformed, discolored and tract palpation 6-10  Musculoskeletal: HAV bilaterally  Assessment: Diabetic with peripheral arterial disease Protective sensation intact bilaterally Healed ulceration second left toe Corns 2 Symptomatic mycotic toenails 6-10  Plan: Debridement toenails 6-10 mechanically and legs without any bleeding Debrided keratoses 2 without any bleeding  Patient measured and depression obtain for diabetic shoes pending  Reappoint 3 months and notify patient upon receipt of diabetic shoes and custom insoles

## 2016-04-27 ENCOUNTER — Other Ambulatory Visit: Payer: Self-pay | Admitting: Cardiology

## 2016-04-27 NOTE — Telephone Encounter (Signed)
Rx(s) sent to pharmacy electronically.  

## 2016-06-05 ENCOUNTER — Encounter: Payer: Self-pay | Admitting: Cardiology

## 2016-06-05 ENCOUNTER — Other Ambulatory Visit: Payer: Self-pay | Admitting: Cardiology

## 2016-06-08 ENCOUNTER — Other Ambulatory Visit: Payer: Self-pay

## 2016-06-08 MED ORDER — SPIRONOLACTONE 25 MG PO TABS: 12.5000 mg | ORAL_TABLET | Freq: Every day | ORAL | 0 refills | Status: DC

## 2016-06-13 NOTE — Progress Notes (Deleted)
Jacob Pittman Date of Birth: 02-May-1945 Medical Record #[id number]  History of Present Illness: [name] is seen for followup of his congestive heart failure. 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 EF of 15% despite optimal medical therapy. He is s/p ICD placement in December 2014. He also has a history of PAD. He was seen by Dr. Allyson Sabal but wasn't having symptoms so conservative therapy was recommended. Last device check in March 2017. Last seen in our office April 2017.   Current Outpatient Prescriptions on File Prior to Visit  Medication Sig Dispense Refill  . albuterol (PROVENTIL HFA;VENTOLIN HFA) 108 (90 BASE) MCG/ACT inhaler Inhale 2 puffs into the lungs every 6 (six) hours as needed for wheezing or shortness of breath. 1 Inhaler 5  . aspirin 81 MG EC tablet Take 81 mg by mouth daily.      . carvedilol (COREG) 12.5 MG tablet TAKE ONE TABLET BY MOUTH TWICE DAILY 60 tablet 2  . cetirizine (ZYRTEC) 10 MG tablet Take 1 tablet (10 mg total) by mouth daily. 30 tablet 11  . fluticasone (FLONASE) 50 MCG/ACT nasal spray Place 2 sprays into both nostrils daily. 16 g 2  . furosemide (LASIX) 40 MG tablet Take 2 tablets ( 80 mg ) twice a day 100 tablet 3  . lovastatin (MEVACOR) 40 MG tablet Take 1 tablet (40 mg total) by mouth at bedtime. 90 tablet 3  . metFORMIN (GLUCOPHAGE-XR) 500 MG 24 hr tablet TAKE TWO TABLETS BY MOUTH ONCE DAILY IN THE MORNING 180 tablet 0  . metolazone (ZAROXOLYN) 2.5 MG tablet TAKE ONE TABLET BY MOUTH EVERY OTHER DAY 15 tablet 11  . silver sulfADIAZINE (SILVADENE) 1 % cream Apply 1 application topically daily. 50 g 0  . spironolactone (ALDACTONE) 25 MG tablet Take 0.5 tablets (12.5 mg total) by mouth daily. 30 tablet 0  . Tadalafil 2.5 MG TABS Take 1 tablet (2.5 mg total) by mouth daily as needed. 20 each 0   No current facility-administered medications on file prior to visit.     No Known Allergies  Past  Medical History:  Diagnosis Date  . ANXIETY 02/04/2009  . CHF (congestive heart failure) (HCC)   . Chronic systolic CHF (congestive heart failure) (HCC)   . DIABETES MELLITUS, TYPE II 11/07/2008  . 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 HIP PAIN, RIGHT 11/06/2009  . HYPERLIPIDEMIA 11/07/2008  . HYPERTENSION 10/08/2008  . Nonischemic cardiomyopathy [facility])    s/p ICD implantation 03-2013 by Dr Ladona Ridgel  . OSTEOARTHRITIS, KNEE, RIGHT 10/08/2008  . Peripheral arterial disease [facility])     Past Surgical History:  Procedure Laterality Date  . CARDIAC DEFIBRILLATOR PLACEMENT Left 04/10/13   BTK single chamber ICD implanted by Dr Ladona Ridgel for primary prevention  . IMPLANTABLE CARDIOVERTER DEFIBRILLATOR IMPLANT N/A 04/10/2013   Procedure: IMPLANTABLE CARDIOVERTER DEFIBRILLATOR IMPLANT;  Surgeon: Marinus Maw, MD;  Location: [facility] C[facility] LAB;  Service: Cardiovascular;  Laterality: N/A;  . s/p left broken arm with pin     1980's    History  Smoking Status  . Former Smoker  Smokeless Tobacco  . Never Used    History  Alcohol Use No    Family History  Problem Relation Age of Onset  . Diabetes Mother   . Hypertension Mother     Review of Systems: As noted in history of present illness.  All other systems were reviewed and are negative.  Physical Exam: There were no vitals taken for this visit.  He is a very pleasant, obese black male in no acute distress. HEENT: Normal. Neck is supple without bruits, adenopathy, or thyromegaly. No JVD. Lungs: bibasilar rales Cardiovascular: Regular rate and rhythm. Normal S1 and S2. No gallop, murmur, or click. ICD site has healed well. Abdomen: Obese, soft, nontender. Bowel sounds are positive. No bladder splenomegaly. Extremities: No cyanosis. 2+ edema with diffuse weeping. Pedal pulses are 2+. Skin breakdown left shin from abrasion. Skin: Warm and dry Neuro: Alert and oriented x3. Cranial nerves II through XII are intact. No focal  findings.  LABORATORY DATA: Lab Results  Component Value Date   WBC 5.8 07/15/2015   HGB 13.1 07/15/2015   HCT 38.3 (L) 07/15/2015   PLT 198.0 07/15/2015   GLUCOSE 100 (H) 07/15/2015   CHOL 200 07/15/2015   TRIG 113.0 07/15/2015   HDL 47.20 07/15/2015   LDLDIRECT 141.6 05/12/2013   LDLCALC 130 (H) 07/15/2015   ALT 13 07/15/2015   AST 15 07/15/2015   NA 133 (L) 07/15/2015   K 5.0 07/15/2015   CL 98 07/15/2015   CREATININE 1.12 07/15/2015   BUN 26 (H) 07/15/2015   CO2 28 07/15/2015   TSH 1.27 07/15/2015   PSA 2.03 07/15/2015   INR 1.1 (H) 04/03/2013   HGBA1C 5.8 07/15/2015   MICROALBUR 1.0 07/15/2015    Echo 05/25/14:Study Conclusions  - Left ventricle: The cavity size was mildly dilated. Wall thickness was normal. The estimated ejection fraction was 15%. Diffuse hypokinesis. Features are consistent with a pseudonormal left ventricular filling pattern, with concomitant abnormal relaxation and increased filling pressure (grade 2 diastolic dysfunction). - Aortic valve: There was no stenosis. There was trivial regurgitation. - Mitral valve: Mildly calcified annulus. There was mild regurgitation. - Left atrium: The atrium was severely dilated. - Right ventricle: The cavity size was normal. Pacer wire or catheter noted in right ventricle. Systolic function was moderately to severely reduced. - Right atrium: The atrium was moderately dilated. - Tricuspid valve: Peak RV-RA gradient (S): 39 mm Hg. - Pulmonary arteries: PA peak pressure: 54 mm Hg (S). - Systemic veins: IVC measured 3.0 cm with < 50% respirophasic variation, suggesting RA pressure 15 mmHg. - Pericardium, extracardiac: A trivial pericardial effusion was identified.  Impressions:  - Mildly dilated LV with severe global hypokinesis, EF 15%. Moderate diastolic dysfunction. Normal RV size with moderate to severe systolic dysfunction. Mild MR. Moderate pulmonary hypertension. Dilated  IVC suggestive of elevated RV filling pressure.  Assessment / Plan: 1. Acute on Chronic systolic congestive heart failure. Ejection fraction 15% despite optimal medical therapy. This is probably  related to chronic moonshine abuse. No significant ischemia or infarction noted on Myoview study. Patient is no longer drinking alcohol. He is on appropriate therapy with lisinopril, carvedilol, Aldactone, and Lasix. Now weight is back up 17 lbs. Will continue lasix 40 mg bid. We will continue sodium restriction 2 g daily. Will add Metolazone 2.5 mg daily for 3 days then every other day. Repeat BMET and BNP in one week. Arrange follow up in 2 weeks. May need to consider referral to CHF clinic if he is resistant.   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 Hypertension-controlled  4. DM type 2.  5. S/p prophylactic ICD. Follow up in device clinic  6. PAD- remains asymptomatic. Has regular follow up with podiatry.

## 2016-06-15 ENCOUNTER — Ambulatory Visit: Payer: Medicare Other | Admitting: Cardiology

## 2016-06-21 NOTE — Progress Notes (Signed)
Jacob Pittman Date of Birth: Feb 11, 1946 Medical Record #[id number]  History of Present Illness: Jacob Pittman is seen for followup of his congestive heart failure. 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 EF of 15% despite optimal medical therapy. Jacob Pittman is s/p ICD placement in December 2014. Jacob Pittman also has a history of PAD. Jacob Pittman was seen by Dr. Allyson Sabal but wasn't having symptoms so conservative therapy was recommended. When seen by Dr. Ladona Ridgel in June 2016 and  Jacob Pittman was noted to be volume overloaded. His BNP was elevated. Lasix was increased to 40 mg bid. Jacob Pittman has been seen in our office several times after for adjustment in medications. Last seen in April 2017. Last pacemaker check in March 2017 was satisfactory.   On follow up today Jacob Pittman states Jacob Pittman is doing very well. Jacob Pittman is only taking lasix once a day- 40 mg instead of 80 mg bid. Reports compliance with other medication. Denies any increase edema, orthopnea, dyspnea, palpitations, or chest pain. Reports drinking a pint of bonded whiskey daily. States Jacob Pittman chases it with tea. When Jacob Pittman used to drink moonshine Jacob Pittman chased it with beer so Jacob Pittman thinks Jacob Pittman is doing much better.   Current Outpatient Prescriptions on File Prior to Visit  Medication Sig Dispense Refill  . albuterol (PROVENTIL HFA;VENTOLIN HFA) 108 (90 BASE) MCG/ACT inhaler Inhale 2 puffs into the lungs every 6 (six) hours as needed for wheezing or shortness of breath. 1 Inhaler 5  . aspirin 81 MG EC tablet Take 81 mg by mouth daily.      . carvedilol (COREG) 12.5 MG tablet TAKE ONE TABLET BY MOUTH TWICE DAILY 60 tablet 2  . cetirizine (ZYRTEC) 10 MG tablet Take 1 tablet (10 mg total) by mouth daily. 30 tablet 11  . fluticasone (FLONASE) 50 MCG/ACT nasal spray Place 2 sprays into both nostrils daily. 16 g 2  . furosemide (LASIX) 40 MG tablet Take 2 tablets ( 80 mg ) twice a day 100 tablet 3  . lovastatin (MEVACOR) 40 MG tablet Take 1 tablet (40 mg total)  by mouth at bedtime. 90 tablet 3  . metFORMIN (GLUCOPHAGE-XR) 500 MG 24 hr tablet TAKE TWO TABLETS BY MOUTH ONCE DAILY IN THE MORNING 180 tablet 0  . metolazone (ZAROXOLYN) 2.5 MG tablet TAKE ONE TABLET BY MOUTH EVERY OTHER DAY 15 tablet 11  . silver sulfADIAZINE (SILVADENE) 1 % cream Apply 1 application topically daily. 50 g 0  . spironolactone (ALDACTONE) 25 MG tablet Take 0.5 tablets (12.5 mg total) by mouth daily. 30 tablet 0  . Tadalafil 2.5 MG TABS Take 1 tablet (2.5 mg total) by mouth daily as needed. 20 each 0   No current facility-administered medications on file prior to visit.     No Known Allergies  Past Medical History:  Diagnosis Date  . ANXIETY 02/04/2009  . CHF (congestive heart failure) (HCC)   . Chronic systolic CHF (congestive heart failure) (HCC)   . DIABETES MELLITUS, TYPE II 11/07/2008  . 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 HIP PAIN, RIGHT 11/06/2009  . HYPERLIPIDEMIA 11/07/2008  . HYPERTENSION 10/08/2008  . Nonischemic cardiomyopathy [facility])    s/p ICD implantation 03-2013 by Dr Ladona Ridgel  . OSTEOARTHRITIS, KNEE, RIGHT 10/08/2008  . Peripheral arterial disease [facility])     Past Surgical History:  Procedure Laterality Date  . CARDIAC DEFIBRILLATOR PLACEMENT Left 04/10/13   BTK single chamber ICD implanted by Dr Ladona Ridgel for primary prevention  . IMPLANTABLE CARDIOVERTER DEFIBRILLATOR IMPLANT N/A  04/10/2013   Procedure: IMPLANTABLE CARDIOVERTER DEFIBRILLATOR IMPLANT;  Surgeon: Marinus Maw, MD;  Location: [facility] CATH LAB;  Service: Cardiovascular;  Laterality: N/A;  . s/p left broken arm with pin     1980's    History  Smoking Status  . Former Smoker  Smokeless Tobacco  . Never Used    History  Alcohol Use No    Family History  Problem Relation Age of Onset  . Diabetes Mother   . Hypertension Mother     Review of Systems: As noted in history of present illness.  All other systems were reviewed and are negative.  Physical Exam: BP (!) 144/78    Pulse 84   Ht 6\' 1"  (1.854 m)   Wt 208 lb 6.4 oz (94.5 kg)   BMI 27.50 kg/m  Jacob Pittman is a very pleasant, obese black male in no acute distress. HEENT: Normal. Neck is supple without bruits, adenopathy, or thyromegaly. No JVD. Lungs: bibasilar rales Cardiovascular: Regular rate and rhythm. Normal S1 and S2. No gallop, murmur, or click. ICD site has healed well. Abdomen: Obese, soft, nontender. Bowel sounds are positive. No bladder splenomegaly. Extremities: No cyanosis. No edema with diffuse weeping. Pedal pulses are 2+. Skin breakdown left shin from abrasion. Skin: Warm and dry Neuro: Alert and oriented x3. Cranial nerves II through XII are intact. No focal findings.  LABORATORY DATA: Lab Results  Component Value Date   WBC 5.8 07/15/2015   HGB 13.1 07/15/2015   HCT 38.3 (L) 07/15/2015   PLT 198.0 07/15/2015   GLUCOSE 100 (H) 07/15/2015   CHOL 200 07/15/2015   TRIG 113.0 07/15/2015   HDL 47.20 07/15/2015   LDLDIRECT 141.6 05/12/2013   LDLCALC 130 (H) 07/15/2015   ALT 13 07/15/2015   AST 15 07/15/2015   NA 133 (L) 07/15/2015   K 5.0 07/15/2015   CL 98 07/15/2015   CREATININE 1.12 07/15/2015   BUN 26 (H) 07/15/2015   CO2 28 07/15/2015   TSH 1.27 07/15/2015   PSA 2.03 07/15/2015   INR 1.1 (H) 04/03/2013   HGBA1C 5.8 07/15/2015   MICROALBUR 1.0 07/15/2015   Ecg today shows NSR rate 84. RBBB. No acute change. I have personally reviewed and interpreted this study.  Echo 05/25/14:Study Conclusions  - Left ventricle: The cavity size was mildly dilated. Wall thickness was normal. The estimated ejection fraction was 15%. Diffuse hypokinesis. Features are consistent with a pseudonormal left ventricular filling pattern, with concomitant abnormal relaxation and increased filling pressure (grade 2 diastolic dysfunction). - Aortic valve: There was no stenosis. There was trivial regurgitation. - Mitral valve: Mildly calcified annulus. There was mild regurgitation. - Left  atrium: The atrium was severely dilated. - Right ventricle: The cavity size was normal. Pacer wire or catheter noted in right ventricle. Systolic function was moderately to severely reduced. - Right atrium: The atrium was moderately dilated. - Tricuspid valve: Peak RV-RA gradient (S): 39 mm Hg. - Pulmonary arteries: PA peak pressure: 54 mm Hg (S). - Systemic veins: IVC measured 3.0 cm with < 50% respirophasic variation, suggesting RA pressure 15 mmHg. - Pericardium, extracardiac: A trivial pericardial effusion was identified.  Impressions:  - Mildly dilated LV with severe global hypokinesis, EF 15%. Moderate diastolic dysfunction. Normal RV size with moderate to severe systolic dysfunction. Mild MR. Moderate pulmonary hypertension. Dilated IVC suggestive of elevated RV filling pressure.  Assessment / Plan: 1.  Chronic systolic congestive heart failure. Ejection fraction 15% despite optimal medical therapy. This is probably  related  to chronic moonshine/Etoh abuse. No significant ischemia or infarction noted on Myoview study. Patient is still drinking Etoh.  Jacob Pittman is on appropriate therapy with lisinopril, carvedilol, Aldactone, and Lasix. Will continue lasix 40 mg daily. We will continue sodium restriction 2 g daily. On  Metolazone 2.5 mg  every other day. Would consider increasing his aldactone but no recent renal labs. Seen by Dr. Jonny Ruiz in October and labs ordered but patient never went to have done. States Jacob Pittman will go have labs with Dr. Jonny Ruiz. Stressed importance of complete cessation of Etoh.   2. Alcohol abuse-still drinking  3. Hypertension-controlled  4. DM type 2.  5. S/p prophylactic ICD. Follow up in device clinic  6. PAD- remains asymptomatic.

## 2016-06-24 ENCOUNTER — Encounter: Payer: Self-pay | Admitting: Cardiology

## 2016-06-24 ENCOUNTER — Ambulatory Visit (INDEPENDENT_AMBULATORY_CARE_PROVIDER_SITE_OTHER): Payer: Self-pay | Admitting: Cardiology

## 2016-06-24 VITALS — BP 144/78 | HR 84 | Ht 73.0 in | Wt 208.4 lb

## 2016-06-24 DIAGNOSIS — I5042 Chronic combined systolic (congestive) and diastolic (congestive) heart failure: Secondary | ICD-10-CM

## 2016-06-24 DIAGNOSIS — E785 Hyperlipidemia, unspecified: Secondary | ICD-10-CM

## 2016-06-24 DIAGNOSIS — I428 Other cardiomyopathies: Secondary | ICD-10-CM

## 2016-06-24 DIAGNOSIS — Z95810 Presence of automatic (implantable) cardiac defibrillator: Secondary | ICD-10-CM

## 2016-06-24 MED ORDER — FUROSEMIDE 40 MG PO TABS: 40.0000 mg | ORAL_TABLET | Freq: Every day | ORAL | 3 refills | Status: DC

## 2016-06-24 NOTE — Patient Instructions (Signed)
Continue your current therapy  Go ahead and get your lab work with Dr. Jonny Ruiz  Stop drinking alcohol-- it is poison to your heart  I will see  You in 6 months.

## 2016-06-26 ENCOUNTER — Encounter: Payer: Self-pay | Admitting: Cardiology

## 2016-07-08 ENCOUNTER — Other Ambulatory Visit: Payer: Self-pay

## 2016-07-08 ENCOUNTER — Ambulatory Visit: Payer: Medicare Other | Admitting: Podiatry

## 2016-07-08 DIAGNOSIS — I5042 Chronic combined systolic (congestive) and diastolic (congestive) heart failure: Secondary | ICD-10-CM

## 2016-07-08 MED ORDER — FUROSEMIDE 40 MG PO TABS: 40.0000 mg | ORAL_TABLET | Freq: Every day | ORAL | 6 refills | Status: DC

## 2016-08-05 ENCOUNTER — Other Ambulatory Visit: Payer: Self-pay | Admitting: Internal Medicine

## 2016-08-05 ENCOUNTER — Ambulatory Visit: Payer: Medicare Other | Admitting: Podiatry

## 2016-08-06 ENCOUNTER — Emergency Department (HOSPITAL_COMMUNITY)
Admission: EM | Admit: 2016-08-06 | Discharge: 2016-08-06 | Disposition: A | Discharge status: Home / Self Care | Payer: Commercial Managed Care - HMO | Attending: Emergency Medicine | Admitting: Emergency Medicine

## 2016-08-06 ENCOUNTER — Encounter (HOSPITAL_COMMUNITY): Payer: Self-pay | Admitting: Emergency Medicine

## 2016-08-06 DIAGNOSIS — Z7984 Long term (current) use of oral hypoglycemic drugs: Secondary | ICD-10-CM | POA: Insufficient documentation

## 2016-08-06 DIAGNOSIS — Z7982 Long term (current) use of aspirin: Secondary | ICD-10-CM | POA: Insufficient documentation

## 2016-08-06 DIAGNOSIS — K5641 Fecal impaction: Secondary | ICD-10-CM

## 2016-08-06 DIAGNOSIS — Z87891 Personal history of nicotine dependence: Secondary | ICD-10-CM | POA: Insufficient documentation

## 2016-08-06 DIAGNOSIS — Z79899 Other long term (current) drug therapy: Secondary | ICD-10-CM | POA: Insufficient documentation

## 2016-08-06 DIAGNOSIS — I110 Hypertensive heart disease with heart failure: Secondary | ICD-10-CM | POA: Insufficient documentation

## 2016-08-06 DIAGNOSIS — E119 Type 2 diabetes mellitus without complications: Secondary | ICD-10-CM | POA: Insufficient documentation

## 2016-08-06 DIAGNOSIS — K5900 Constipation, unspecified: Secondary | ICD-10-CM | POA: Diagnosis present

## 2016-08-06 DIAGNOSIS — K5904 Chronic idiopathic constipation: Secondary | ICD-10-CM | POA: Insufficient documentation

## 2016-08-06 DIAGNOSIS — I5042 Chronic combined systolic (congestive) and diastolic (congestive) heart failure: Secondary | ICD-10-CM | POA: Diagnosis not present

## 2016-08-06 NOTE — Discharge Instructions (Signed)
TAKE 8 CAPFULS OF MIRALAX IN A 32 OUNCE GATORADE AND DRINK THE WHOLE BEVERAGE FOLLOWED.   Use a fleets enema.  Return for sudden worsening abdominal pain, uncontrolled vomiting.

## 2016-08-06 NOTE — ED Provider Notes (Signed)
[facility]-EMERGENCY DEPT Provider Note   CSN: [id number] Arrival date & time: 08/06/16  7915     History   Chief Complaint Chief Complaint  Patient presents with  . Constipation    HPI Jacob Pittman is a 71 y.o. male.  71 yo M with a cc of constipation.  Going on for past couple of days.  Denies abdominal pain denies vomiting. Has been trying to poop but unable to get anything out. He feels like it stuck down at his rectum.   The history is provided by the patient.  Constipation   This is a new problem. The current episode started less than 1 hour ago. The stool is described as malodorous. Pertinent negatives include no abdominal pain. There is no fiber in the patient's diet. He does not exercise regularly. There has not been adequate water intake. He has tried nothing for the symptoms. The treatment provided no relief. Risk factors include a recent illness.    Past Medical History:  Diagnosis Date  . ANXIETY 02/04/2009  . CHF (congestive heart failure) (HCC)   . Chronic systolic CHF (congestive heart failure) (HCC)   . DIABETES MELLITUS, TYPE II 11/07/2008  . 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 HIP PAIN, RIGHT 11/06/2009  . HYPERLIPIDEMIA 11/07/2008  . HYPERTENSION 10/08/2008  . Nonischemic cardiomyopathy [facility])    s/p ICD implantation 03-2013 by Dr Ladona Ridgel  . OSTEOARTHRITIS, KNEE, RIGHT 10/08/2008  . Peripheral arterial disease [facility])     Patient Active Problem List   Diagnosis Date Noted  . Elevated PSA 02/07/2016  . Contact dermatitis 07/23/2015  . History of colonic polyps 07/15/2015  . Venous stasis ulcer of left lower extremity (HCC) 01/21/2015  . Left arm swelling 01/03/2015  . Dyspnea 01/03/2015  . Irregular heart beats 01/03/2015  . Wheezing 07/19/2014  . Peripheral arterial disease (HCC) 10/31/2013  . Conjunctivitis 09/22/2013  . Allergic rhinitis 09/22/2013  . ICD (implantable cardioverter-defibrillator), single, in situ 07/12/2013  . Chronic combined  systolic and diastolic heart failure, NYHA class 3 (HCC) 04/10/2013  . Nonischemic cardiomyopathy (HCC)   . Acute on chronic combined systolic and diastolic CHF (congestive heart failure) (HCC) 09/30/2012  . History of alcohol use 09/30/2012  . Back pain 10/08/2011  . 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 Preventative health care 08/17/2010  . HIP PAIN, RIGHT 11/06/2009  . ANXIETY 02/04/2009  . Diabetes (HCC) 11/07/2008  . Hyperlipidemia 11/07/2008  . 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 Essential hypertension 10/08/2008  . OSTEOARTHRITIS, KNEE, RIGHT 10/08/2008  . FATIGUE 10/08/2008    Past Surgical History:  Procedure Laterality Date  . CARDIAC DEFIBRILLATOR PLACEMENT Left 04/10/13   BTK single chamber ICD implanted by Dr Ladona Ridgel for primary prevention  . IMPLANTABLE CARDIOVERTER DEFIBRILLATOR IMPLANT N/A 04/10/2013   Procedure: IMPLANTABLE CARDIOVERTER DEFIBRILLATOR IMPLANT;  Surgeon: Marinus Maw, MD;  Location: [facility] CATH LAB;  Service: Cardiovascular;  Laterality: N/A;  . s/p left broken arm with pin     1980's       Home Medications    Prior to Admission medications   Medication Sig Start Date End Date Taking? Authorizing Provider  albuterol (PROVENTIL HFA;VENTOLIN HFA) 108 (90 BASE) MCG/ACT inhaler Inhale 2 puffs into the lungs every 6 (six) hours as needed for wheezing or shortness of breath. 07/19/14   [name], MD  aspirin 81 MG EC tablet Take 81 mg by mouth daily.      Historical Provider, MD  carvedilol (COREG) 12.5 MG tablet TAKE ONE TABLET  BY MOUTH TWICE DAILY 04/27/16   Peter M [place], MD  cetirizine (ZYRTEC) 10 MG tablet Take 1 tablet (10 mg total) by mouth daily. 09/22/13   [name], MD  fluticasone (FLONASE) 50 MCG/ACT nasal spray Place 2 sprays into both nostrils daily. 09/22/13   [name], MD  furosemide (LASIX) 40 MG tablet Take 1 tablet (40 mg total) by mouth daily. 07/08/16 10/06/16  Peter M [place], MD  lovastatin (MEVACOR) 40 MG tablet Take 1  tablet (40 mg total) by mouth at bedtime. 07/15/15   [name], MD  metFORMIN (GLUCOPHAGE-XR) 500 MG 24 hr tablet TAKE TWO TABLETS BY MOUTH ONCE DAILY IN THE MORNING 03/18/16   [name], MD  metolazone (ZAROXOLYN) 2.5 MG tablet TAKE ONE TABLET BY MOUTH EVERY OTHER DAY 01/10/16   Peter M [place], MD  silver sulfADIAZINE (SILVADENE) 1 % cream Apply 1 application topically daily. 10/16/15   Carrington Clamp, DPM  spironolactone (ALDACTONE) 25 MG tablet Take 0.5 tablets (12.5 mg total) by mouth daily. 06/08/16   Peter M [place], MD  Tadalafil 2.5 MG TABS Take 1 tablet (2.5 mg total) by mouth daily as needed. 03/04/15   Dwana Melena, PA-C    Family History Family History  Problem Relation Age of Onset  . Diabetes Mother   . Hypertension Mother     Social History Social History  Substance Use Topics  . Smoking status: Former Games developer  . Smokeless tobacco: Never Used  . Alcohol use No     Allergies   Patient has no known allergies.   Review of Systems Review of Systems  Constitutional: Negative for chills and fever.  HENT: Negative for congestion and facial swelling.   Eyes: Negative for discharge and visual disturbance.  Respiratory: Negative for shortness of breath.   Cardiovascular: Negative for chest pain and palpitations.  Gastrointestinal: Positive for constipation. Negative for abdominal pain, diarrhea and vomiting.  Musculoskeletal: Negative for arthralgias and myalgias.  Skin: Negative for color change and rash.  Neurological: Negative for tremors, syncope and headaches.  Psychiatric/Behavioral: Negative for confusion and dysphoric mood.     Physical Exam Updated Vital Signs BP 136/73 (BP Location: Left Arm)   Pulse 81   Temp 98.2 F (36.8 C) (Oral)   Resp 15   SpO2 97%   Physical Exam  Constitutional: He is oriented to person, place, and time. He appears well-developed and well-nourished.  HENT:  Head: Normocephalic and atraumatic.  Eyes: EOM are normal.  Pupils are equal, round, and reactive to light.  Neck: Normal range of motion. Neck supple. No JVD present.  Cardiovascular: Normal rate and regular rhythm.  Exam reveals no gallop and no friction rub.   No murmur heard. Pulmonary/Chest: No respiratory distress. He has no wheezes.  Abdominal: He exhibits no distension and no mass. There is no tenderness. There is no rebound and no guarding.  Genitourinary:  Genitourinary Comments: Hard stool in vault  Musculoskeletal: Normal range of motion.  Neurological: He is alert and oriented to person, place, and time.  Skin: No rash noted. No pallor.  Psychiatric: He has a normal mood and affect. His behavior is normal.  Nursing note and vitals reviewed.    ED Treatments / Results  Labs (all labs ordered are listed, but only abnormal results are displayed) Labs Reviewed - No data to display  EKG  EKG Interpretation None       Radiology No results found.  Procedures Fecal disimpaction Date/Time:  08/06/2016 9:22 AM Performed by: Melene Plan Authorized by: Melene Plan  Consent: Verbal consent obtained. Risks and benefits: risks, benefits and alternatives were discussed Consent given by: patient Patient identity confirmed: verbally with patient Local anesthesia used: no  Anesthesia: Local anesthesia used: no  Sedation: Patient sedated: no Patient tolerance: Patient tolerated the procedure well with no immediate complications    (including critical care time)  Medications Ordered in ED Medications - No data to display   Initial Impression / Assessment and Plan / ED Course  I have reviewed the triage vital signs and the nursing notes.  Pertinent labs & imaging results that were available during my care of the patient were reviewed by me and considered in my medical decision making (see chart for details).     71 yo M With a chief complaint of constipation. Clinically is impacted. Was able to remove some stool from the  vault. Patient with tenderness on procedure requesting to go home and do a MiraLAX cleanout with enema. Discharge home.  9:23 AM:  I have discussed the diagnosis/risks/treatment options with the patient and believe the pt to be eligible for discharge home to follow-up with PCP. We also discussed returning to the ED immediately if new or worsening sx occur. We discussed the sx which are most concerning (e.g., sudden worsening pain, fever, inability to tolerate by mouth) that necessitate immediate return. Medications administered to the patient during their visit and any new prescriptions provided to the patient are listed below.  Medications given during this visit Medications - No data to display   The patient appears reasonably screen and/or stabilized for discharge and I doubt any other medical condition or other [facility] requiring further screening, evaluation, or treatment in the ED at this time prior to discharge.    Final Clinical Impressions(s) / ED Diagnoses   Final diagnoses:  Chronic idiopathic constipation  Fecal impaction in rectum [facility])    New Prescriptions New Prescriptions   No medications on file     Melene Plan, DO 08/06/16 1610

## 2016-08-06 NOTE — ED Triage Notes (Signed)
Pt sts constipation with no BM x 4 days

## 2016-08-07 ENCOUNTER — Other Ambulatory Visit: Payer: Self-pay | Admitting: Internal Medicine

## 2016-08-07 ENCOUNTER — Ambulatory Visit: Payer: Medicare Other | Admitting: Internal Medicine

## 2016-09-09 ENCOUNTER — Other Ambulatory Visit: Payer: Commercial Managed Care - HMO

## 2016-09-09 ENCOUNTER — Encounter: Payer: Self-pay | Admitting: Podiatry

## 2016-09-09 ENCOUNTER — Ambulatory Visit (INDEPENDENT_AMBULATORY_CARE_PROVIDER_SITE_OTHER): Payer: Medicare PPO | Admitting: Podiatry

## 2016-09-09 DIAGNOSIS — I739 Peripheral vascular disease, unspecified: Secondary | ICD-10-CM

## 2016-09-09 DIAGNOSIS — M79676 Pain in unspecified toe(s): Secondary | ICD-10-CM | POA: Diagnosis not present

## 2016-09-09 DIAGNOSIS — E1151 Type 2 diabetes mellitus with diabetic peripheral angiopathy without gangrene: Secondary | ICD-10-CM | POA: Diagnosis not present

## 2016-09-09 DIAGNOSIS — B351 Tinea unguium: Secondary | ICD-10-CM | POA: Diagnosis not present

## 2016-09-09 DIAGNOSIS — L84 Corns and callosities: Secondary | ICD-10-CM | POA: Diagnosis not present

## 2016-09-09 NOTE — Patient Instructions (Signed)

## 2016-09-09 NOTE — Progress Notes (Signed)
Patient ID: Jacob Pittman, male   DOB: [birth year], 71 y.o.   MRN: [id number]    Subjective: This patient presents today for scheduled visit complaining that his toenails are thickened and elongated and are comfortable walking wearing shoes and requests toenail debridement. Patient is history of diabetic wound that is healed in the past Patient is diabetic with history of peripheral arterial disease     Objective: Orientated 3 DP 2/4 bilaterally PT pulses 0/4 bilaterally Capillary reflex delayed bilaterally  Neurological: Sensation to 10 g monofilament or intact 5/5 right 4/5 left Vibratory sensation reactive bilaterally Ankle reflex equal and reactive bilaterally  Dermatological: No open skin lesions bilaterally Dry, atrophic skin bilaterally Absent hair growth bilaterally Large corns medial proximal interphalangeal joint second toe right that remain closed after debridement The toenails elongated, brittle, deformed, discolored and tract palpation 6-10  Musculoskeletal: HAV bilaterally  Assessment: Diabetic with peripheral arterial disease Protective sensation intact bilaterally Healed ulceration second left toe Corns 1 Symptomatic mycotic toenails 6-10  Plan: Debridement toenails 6-10 mechanically and legs without any bleeding Debrided pre-ulcerative corn second right toe without any bleeding Dispensed gel capwear over second right toe  Reappoint 3 months

## 2016-09-10 ENCOUNTER — Ambulatory Visit (INDEPENDENT_AMBULATORY_CARE_PROVIDER_SITE_OTHER): Payer: Medicare PPO | Admitting: Internal Medicine

## 2016-09-10 ENCOUNTER — Encounter: Payer: Self-pay | Admitting: Internal Medicine

## 2016-09-10 ENCOUNTER — Other Ambulatory Visit: Payer: Self-pay | Admitting: Internal Medicine

## 2016-09-10 ENCOUNTER — Telehealth: Payer: Self-pay

## 2016-09-10 ENCOUNTER — Other Ambulatory Visit (INDEPENDENT_AMBULATORY_CARE_PROVIDER_SITE_OTHER): Payer: Medicare PPO

## 2016-09-10 VITALS — BP 122/70 | HR 71 | Ht 73.0 in | Wt 200.0 lb

## 2016-09-10 DIAGNOSIS — Z86010 Personal history of colonic polyps: Secondary | ICD-10-CM

## 2016-09-10 DIAGNOSIS — I10 Essential (primary) hypertension: Secondary | ICD-10-CM

## 2016-09-10 DIAGNOSIS — E11622 Type 2 diabetes mellitus with other skin ulcer: Secondary | ICD-10-CM

## 2016-09-10 DIAGNOSIS — Z0000 Encounter for general adult medical examination without abnormal findings: Secondary | ICD-10-CM | POA: Diagnosis not present

## 2016-09-10 LAB — CBC WITH DIFFERENTIAL/PLATELET
BASOS PCT: 1.2 % (ref 0.0–3.0)
Basophils Absolute: 0.1 10*3/uL (ref 0.0–0.1)
EOS ABS: 0.2 10*3/uL (ref 0.0–0.7)
Eosinophils Relative: 3.2 % (ref 0.0–5.0)
HEMATOCRIT: 41.9 % (ref 39.0–52.0)
Hemoglobin: 14.2 g/dL (ref 13.0–17.0)
LYMPHS ABS: 2 10*3/uL (ref 0.7–4.0)
Lymphocytes Relative: 36.5 % (ref 12.0–46.0)
MCHC: 34 g/dL (ref 30.0–36.0)
MCV: 100.5 fl — ABNORMAL HIGH (ref 78.0–100.0)
MONO ABS: 0.8 10*3/uL (ref 0.1–1.0)
Monocytes Relative: 14.2 % — ABNORMAL HIGH (ref 3.0–12.0)
NEUTROS ABS: 2.4 10*3/uL (ref 1.4–7.7)
NEUTROS PCT: 44.9 % (ref 43.0–77.0)
PLATELETS: 259 10*3/uL (ref 150.0–400.0)
RBC: 4.17 Mil/uL — ABNORMAL LOW (ref 4.22–5.81)
RDW: 13.2 % (ref 11.5–15.5)
WBC: 5.4 10*3/uL (ref 4.0–10.5)

## 2016-09-10 LAB — URINALYSIS, ROUTINE W REFLEX MICROSCOPIC
Hgb urine dipstick: NEGATIVE
Ketones, ur: NEGATIVE
Leukocytes, UA: NEGATIVE
Nitrite: NEGATIVE
RBC / HPF: NONE SEEN (ref 0–?)
SPECIFIC GRAVITY, URINE: 1.025 (ref 1.000–1.030)
URINE GLUCOSE: NEGATIVE
Urobilinogen, UA: >=8 — AB (ref 0.0–1.0)
pH: 6.5 (ref 5.0–8.0)

## 2016-09-10 LAB — LIPID PANEL
CHOL/HDL RATIO: 5
Cholesterol: 195 mg/dL (ref 0–200)
HDL: 42 mg/dL (ref >39.00)
LDL Cholesterol: 135 mg/dL — ABNORMAL HIGH (ref 0–99)
NONHDL: 153.31
TRIGLYCERIDES: 93 mg/dL (ref 0.0–149.0)
VLDL: 18.6 mg/dL (ref 0.0–40.0)

## 2016-09-10 LAB — HEPATIC FUNCTION PANEL
ALT: 16 U/L (ref 0–53)
AST: 17 U/L (ref 0–37)
Albumin: 4.4 g/dL (ref 3.5–5.2)
Alkaline Phosphatase: 69 U/L (ref 39–117)
BILIRUBIN DIRECT: 0.4 mg/dL — AB (ref 0.0–0.3)
BILIRUBIN TOTAL: 1.2 mg/dL (ref 0.2–1.2)
Total Protein: 6.9 g/dL (ref 6.0–8.3)

## 2016-09-10 LAB — MICROALBUMIN / CREATININE URINE RATIO
CREATININE, U: 238.9 mg/dL
MICROALB/CREAT RATIO: 2.7 mg/g (ref 0.0–30.0)
Microalb, Ur: 6.5 mg/dL — ABNORMAL HIGH (ref 0.0–1.9)

## 2016-09-10 LAB — BASIC METABOLIC PANEL
BUN: 25 mg/dL — ABNORMAL HIGH (ref 6–23)
CHLORIDE: 95 meq/L — AB (ref 96–112)
CO2: 32 mEq/L (ref 19–32)
Calcium: 10 mg/dL (ref 8.4–10.5)
Creatinine, Ser: 1.07 mg/dL (ref 0.40–1.50)
GFR: 87.12 mL/min (ref >60.00)
Glucose, Bld: 113 mg/dL — ABNORMAL HIGH (ref 70–99)
POTASSIUM: 4.8 meq/L (ref 3.5–5.1)
Sodium: 135 mEq/L (ref 135–145)

## 2016-09-10 LAB — PSA: PSA: 3.59 ng/mL (ref 0.10–4.00)

## 2016-09-10 LAB — HEMOGLOBIN A1C: HEMOGLOBIN A1C: 6.9 % — AB (ref 4.6–6.5)

## 2016-09-10 LAB — TSH: TSH: 1.92 u[IU]/mL (ref 0.35–4.50)

## 2016-09-10 MED ORDER — GLUCOSE BLOOD VI STRP: ORAL_STRIP | 12 refills | Status: DC

## 2016-09-10 MED ORDER — ONETOUCH ULTRA SYSTEM W/DEVICE KIT: PACK | 0 refills | Status: DC

## 2016-09-10 MED ORDER — LANCETS MISC: 12 refills | Status: DC

## 2016-09-10 MED ORDER — ROSUVASTATIN CALCIUM 20 MG PO TABS: 20.0000 mg | ORAL_TABLET | Freq: Every day | ORAL | 3 refills | Status: DC

## 2016-09-10 NOTE — Telephone Encounter (Signed)
Pt was informed of results and expressed understanding.  
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
## 2016-09-10 NOTE — Assessment & Plan Note (Signed)
Will refer for f/u colonoscopy 

## 2016-09-10 NOTE — Assessment & Plan Note (Signed)
stable overall by history and exam, recent data reviewed with pt, and pt to continue medical treatment as before,  to f/u any worsening symptoms or concerns Lab Results  Component Value Date   HGBA1C 5.8 07/15/2015   New rx for glucometer and supplies sent

## 2016-09-10 NOTE — Patient Instructions (Addendum)
Please see your eye doctor every year for your eye exam due to the diabetes.  You will be contacted regarding the referral for: colonoscopy  Please continue all other medications as before, and refills have been done if requested.  Please have the pharmacy call with any other refills you may need.  Please continue your efforts at being more active, low cholesterol diet, and weight control.  You are otherwise up to date with prevention measures today.  Please keep your appointments with your specialists as you may have planned  Please go to the LAB in the Basement (turn left off the elevator) for the tests to be done today  You will be contacted by phone if any changes need to be made immediately.  Otherwise, you will receive a letter about your results with an explanation, but please check with MyChart first.  Please remember to sign up for MyChart if you have not done so, as this will be important to you in the future with finding out test results, communicating by private email, and scheduling acute appointments online when needed.  Please return in 6 months, or sooner if needed, with Lab testing done 3-5 days before

## 2016-09-10 NOTE — Assessment & Plan Note (Signed)

## 2016-09-10 NOTE — Assessment & Plan Note (Signed)
stable overall by history and exam, recent data reviewed with pt, and pt to continue medical treatment as before,  to f/u any worsening symptoms or concerns   BP Readings from Last 3 Encounters:  09/10/16 122/70  08/06/16 136/73  06/24/16 (!) 144/78

## 2016-09-10 NOTE — Telephone Encounter (Signed)
-----   Message from [name], MD sent at 09/10/2016  1:58 PM EDT ----- Letter sent, cont same tx except  The test results show that your current treatment is OK, except the LDL cholesterol is still too high, since the goal is to be less than 70.  We should change your Lovastatin medication to a more effective one called Crestor 20 mg per day (generic)  I will send a new prescription, and you should hear about this from the office as well.     Mc Hollen to please inform pt, I will do rx

## 2016-10-15 ENCOUNTER — Encounter: Payer: Self-pay | Admitting: Internal Medicine

## 2016-10-15 ENCOUNTER — Encounter (INDEPENDENT_AMBULATORY_CARE_PROVIDER_SITE_OTHER): Payer: Self-pay

## 2016-10-15 ENCOUNTER — Ambulatory Visit (INDEPENDENT_AMBULATORY_CARE_PROVIDER_SITE_OTHER): Payer: Medicare PPO | Admitting: Internal Medicine

## 2016-10-15 VITALS — BP 124/70 | HR 86 | Ht 73.0 in | Wt 198.8 lb

## 2016-10-15 DIAGNOSIS — I5022 Chronic systolic (congestive) heart failure: Secondary | ICD-10-CM

## 2016-10-15 DIAGNOSIS — I10 Essential (primary) hypertension: Secondary | ICD-10-CM

## 2016-10-15 DIAGNOSIS — Z95810 Presence of automatic (implantable) cardiac defibrillator: Secondary | ICD-10-CM | POA: Diagnosis not present

## 2016-10-15 MED ORDER — SILDENAFIL CITRATE 20 MG PO TABS: 20.0000 mg | ORAL_TABLET | ORAL | 1 refills | Status: DC
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
## 2016-10-15 NOTE — Patient Instructions (Addendum)
Medication Instructions:  Your physician has recommended you make the following change in your medication:  Sildenafil 20 mg take 2-4 tablets as needed   Labwork: None Ordered   Testing/Procedures: None Ordered   Follow-Up: Your physician wants you to follow-up in: 1 year with Dr. Ladona Ridgel. You will receive a reminder letter in the mail two months in advance. If you don't receive a letter, please call our office to schedule the follow-up appointment.  Remote monitoring is used to monitor your ICD from home. This monitoring reduces the number of office visits required to check your device to one time per year. It allows [place] to keep an eye on the functioning of your device to ensure it is working properly. You are scheduled for a device check from home on 01/14/17. You may send your transmission at any time that day. If you have a wireless device, the transmission will be sent automatically. After your physician reviews your transmission, you will receive a postcard with your next transmission date.    Any Other Special Instructions Will Be Listed Below (If Applicable).     If you need a refill on your cardiac medications before your next appointment, please call your pharmacy.

## 2016-11-19 ENCOUNTER — Encounter: Payer: Self-pay | Admitting: Internal Medicine

## 2016-11-26 ENCOUNTER — Inpatient Hospital Stay (HOSPITAL_COMMUNITY)
Admission: EM | Admit: 2016-11-26 | Discharge: 2017-01-13 | DRG: 853 | Disposition: A | Discharge status: Other Acute Inpatient Hospital | Payer: Medicare PPO | Attending: Internal Medicine | Admitting: Internal Medicine

## 2016-11-26 ENCOUNTER — Encounter (HOSPITAL_COMMUNITY): Payer: Self-pay | Admitting: *Deleted

## 2016-11-26 ENCOUNTER — Emergency Department (HOSPITAL_COMMUNITY): Payer: Medicare PPO

## 2016-11-26 DIAGNOSIS — T8143XA Infection following a procedure, organ and space surgical site, initial encounter: Secondary | ICD-10-CM

## 2016-11-26 DIAGNOSIS — Z452 Encounter for adjustment and management of vascular access device: Secondary | ICD-10-CM

## 2016-11-26 DIAGNOSIS — I480 Paroxysmal atrial fibrillation: Secondary | ICD-10-CM | POA: Diagnosis present

## 2016-11-26 DIAGNOSIS — L89619 Pressure ulcer of right heel, unspecified stage: Secondary | ICD-10-CM | POA: Diagnosis not present

## 2016-11-26 DIAGNOSIS — E11649 Type 2 diabetes mellitus with hypoglycemia without coma: Secondary | ICD-10-CM | POA: Diagnosis not present

## 2016-11-26 DIAGNOSIS — I5082 Biventricular heart failure: Secondary | ICD-10-CM | POA: Diagnosis present

## 2016-11-26 DIAGNOSIS — E871 Hypo-osmolality and hyponatremia: Secondary | ICD-10-CM | POA: Diagnosis not present

## 2016-11-26 DIAGNOSIS — E669 Obesity, unspecified: Secondary | ICD-10-CM | POA: Diagnosis present

## 2016-11-26 DIAGNOSIS — I428 Other cardiomyopathies: Secondary | ICD-10-CM

## 2016-11-26 DIAGNOSIS — Z7189 Other specified counseling: Secondary | ICD-10-CM

## 2016-11-26 DIAGNOSIS — M1711 Unilateral primary osteoarthritis, right knee: Secondary | ICD-10-CM | POA: Diagnosis present

## 2016-11-26 DIAGNOSIS — E43 Unspecified severe protein-calorie malnutrition: Secondary | ICD-10-CM | POA: Diagnosis present

## 2016-11-26 DIAGNOSIS — G9340 Encephalopathy, unspecified: Secondary | ICD-10-CM

## 2016-11-26 DIAGNOSIS — K650 Generalized (acute) peritonitis: Secondary | ICD-10-CM | POA: Diagnosis present

## 2016-11-26 DIAGNOSIS — E878 Other disorders of electrolyte and fluid balance, not elsewhere classified: Secondary | ICD-10-CM | POA: Diagnosis present

## 2016-11-26 DIAGNOSIS — R740 Nonspecific elevation of levels of transaminase and lactic acid dehydrogenase [LDH]: Secondary | ICD-10-CM

## 2016-11-26 DIAGNOSIS — N179 Acute kidney failure, unspecified: Secondary | ICD-10-CM | POA: Diagnosis present

## 2016-11-26 DIAGNOSIS — I749 Embolism and thrombosis of unspecified artery: Secondary | ICD-10-CM

## 2016-11-26 DIAGNOSIS — K760 Fatty (change of) liver, not elsewhere classified: Secondary | ICD-10-CM | POA: Diagnosis present

## 2016-11-26 DIAGNOSIS — T17490A Other foreign object in trachea causing asphyxiation, initial encounter: Secondary | ICD-10-CM | POA: Diagnosis not present

## 2016-11-26 DIAGNOSIS — K632 Fistula of intestine: Secondary | ICD-10-CM | POA: Diagnosis not present

## 2016-11-26 DIAGNOSIS — B9620 Unspecified Escherichia coli [E. coli] as the cause of diseases classified elsewhere: Secondary | ICD-10-CM | POA: Diagnosis not present

## 2016-11-26 DIAGNOSIS — I4510 Unspecified right bundle-branch block: Secondary | ICD-10-CM | POA: Diagnosis present

## 2016-11-26 DIAGNOSIS — A419 Sepsis, unspecified organism: Secondary | ICD-10-CM | POA: Diagnosis not present

## 2016-11-26 DIAGNOSIS — I471 Supraventricular tachycardia: Secondary | ICD-10-CM | POA: Diagnosis not present

## 2016-11-26 DIAGNOSIS — I6340 Cerebral infarction due to embolism of unspecified cerebral artery: Secondary | ICD-10-CM | POA: Diagnosis not present

## 2016-11-26 DIAGNOSIS — K9189 Other postprocedural complications and disorders of digestive system: Secondary | ICD-10-CM | POA: Diagnosis not present

## 2016-11-26 DIAGNOSIS — T07XXXA Unspecified multiple injuries, initial encounter: Secondary | ICD-10-CM

## 2016-11-26 DIAGNOSIS — F22 Delusional disorders: Secondary | ICD-10-CM

## 2016-11-26 DIAGNOSIS — Z833 Family history of diabetes mellitus: Secondary | ICD-10-CM

## 2016-11-26 DIAGNOSIS — K668 Other specified disorders of peritoneum: Secondary | ICD-10-CM | POA: Diagnosis present

## 2016-11-26 DIAGNOSIS — D696 Thrombocytopenia, unspecified: Secondary | ICD-10-CM | POA: Diagnosis present

## 2016-11-26 DIAGNOSIS — T82868A Thrombosis of vascular prosthetic devices, implants and grafts, initial encounter: Secondary | ICD-10-CM | POA: Diagnosis not present

## 2016-11-26 DIAGNOSIS — Z515 Encounter for palliative care: Secondary | ICD-10-CM

## 2016-11-26 DIAGNOSIS — E876 Hypokalemia: Secondary | ICD-10-CM | POA: Diagnosis present

## 2016-11-26 DIAGNOSIS — K429 Umbilical hernia without obstruction or gangrene: Secondary | ICD-10-CM | POA: Diagnosis present

## 2016-11-26 DIAGNOSIS — E119 Type 2 diabetes mellitus without complications: Secondary | ICD-10-CM

## 2016-11-26 DIAGNOSIS — Z6833 Body mass index (BMI) 33.0-33.9, adult: Secondary | ICD-10-CM

## 2016-11-26 DIAGNOSIS — J9600 Acute respiratory failure, unspecified whether with hypoxia or hypercapnia: Secondary | ICD-10-CM

## 2016-11-26 DIAGNOSIS — Z87891 Personal history of nicotine dependence: Secondary | ICD-10-CM

## 2016-11-26 DIAGNOSIS — E875 Hyperkalemia: Secondary | ICD-10-CM | POA: Diagnosis not present

## 2016-11-26 DIAGNOSIS — I472 Ventricular tachycardia: Secondary | ICD-10-CM | POA: Diagnosis not present

## 2016-11-26 DIAGNOSIS — I998 Other disorder of circulatory system: Secondary | ICD-10-CM | POA: Diagnosis present

## 2016-11-26 DIAGNOSIS — K651 Peritoneal abscess: Secondary | ICD-10-CM

## 2016-11-26 DIAGNOSIS — N390 Urinary tract infection, site not specified: Secondary | ICD-10-CM | POA: Diagnosis not present

## 2016-11-26 DIAGNOSIS — Z9889 Other specified postprocedural states: Secondary | ICD-10-CM

## 2016-11-26 DIAGNOSIS — R7881 Bacteremia: Secondary | ICD-10-CM

## 2016-11-26 DIAGNOSIS — T85598A Other mechanical complication of other gastrointestinal prosthetic devices, implants and grafts, initial encounter: Secondary | ICD-10-CM

## 2016-11-26 DIAGNOSIS — I2510 Atherosclerotic heart disease of native coronary artery without angina pectoris: Secondary | ICD-10-CM | POA: Diagnosis present

## 2016-11-26 DIAGNOSIS — E785 Hyperlipidemia, unspecified: Secondary | ICD-10-CM | POA: Diagnosis present

## 2016-11-26 DIAGNOSIS — J811 Chronic pulmonary edema: Secondary | ICD-10-CM

## 2016-11-26 DIAGNOSIS — I639 Cerebral infarction, unspecified: Secondary | ICD-10-CM

## 2016-11-26 DIAGNOSIS — G4733 Obstructive sleep apnea (adult) (pediatric): Secondary | ICD-10-CM | POA: Diagnosis present

## 2016-11-26 DIAGNOSIS — Z66 Do not resuscitate: Secondary | ICD-10-CM | POA: Diagnosis not present

## 2016-11-26 DIAGNOSIS — R6521 Severe sepsis with septic shock: Secondary | ICD-10-CM | POA: Diagnosis present

## 2016-11-26 DIAGNOSIS — I272 Pulmonary hypertension, unspecified: Secondary | ICD-10-CM | POA: Diagnosis present

## 2016-11-26 DIAGNOSIS — B957 Other staphylococcus as the cause of diseases classified elsewhere: Secondary | ICD-10-CM

## 2016-11-26 DIAGNOSIS — I110 Hypertensive heart disease with heart failure: Secondary | ICD-10-CM | POA: Diagnosis present

## 2016-11-26 DIAGNOSIS — Q438 Other specified congenital malformations of intestine: Secondary | ICD-10-CM

## 2016-11-26 DIAGNOSIS — E874 Mixed disorder of acid-base balance: Secondary | ICD-10-CM | POA: Diagnosis present

## 2016-11-26 DIAGNOSIS — R4189 Other symptoms and signs involving cognitive functions and awareness: Secondary | ICD-10-CM | POA: Diagnosis not present

## 2016-11-26 DIAGNOSIS — R1310 Dysphagia, unspecified: Secondary | ICD-10-CM | POA: Diagnosis not present

## 2016-11-26 DIAGNOSIS — Y848 Other medical procedures as the cause of abnormal reaction of the patient, or of later complication, without mention of misadventure at the time of the procedure: Secondary | ICD-10-CM | POA: Diagnosis not present

## 2016-11-26 DIAGNOSIS — E870 Hyperosmolality and hypernatremia: Secondary | ICD-10-CM | POA: Diagnosis present

## 2016-11-26 DIAGNOSIS — Z7982 Long term (current) use of aspirin: Secondary | ICD-10-CM

## 2016-11-26 DIAGNOSIS — K567 Ileus, unspecified: Secondary | ICD-10-CM | POA: Diagnosis not present

## 2016-11-26 DIAGNOSIS — R0902 Hypoxemia: Secondary | ICD-10-CM

## 2016-11-26 DIAGNOSIS — K631 Perforation of intestine (nontraumatic): Secondary | ICD-10-CM

## 2016-11-26 DIAGNOSIS — D638 Anemia in other chronic diseases classified elsewhere: Secondary | ICD-10-CM | POA: Diagnosis not present

## 2016-11-26 DIAGNOSIS — B9562 Methicillin resistant Staphylococcus aureus infection as the cause of diseases classified elsewhere: Secondary | ICD-10-CM | POA: Diagnosis not present

## 2016-11-26 DIAGNOSIS — I5043 Acute on chronic combined systolic (congestive) and diastolic (congestive) heart failure: Secondary | ICD-10-CM | POA: Diagnosis present

## 2016-11-26 DIAGNOSIS — R197 Diarrhea, unspecified: Secondary | ICD-10-CM | POA: Diagnosis present

## 2016-11-26 DIAGNOSIS — D6489 Other specified anemias: Secondary | ICD-10-CM | POA: Diagnosis not present

## 2016-11-26 DIAGNOSIS — I959 Hypotension, unspecified: Secondary | ICD-10-CM

## 2016-11-26 DIAGNOSIS — L89159 Pressure ulcer of sacral region, unspecified stage: Secondary | ICD-10-CM | POA: Diagnosis present

## 2016-11-26 DIAGNOSIS — Y92230 Patient room in hospital as the place of occurrence of the external cause: Secondary | ICD-10-CM | POA: Diagnosis not present

## 2016-11-26 DIAGNOSIS — Z9289 Personal history of other medical treatment: Secondary | ICD-10-CM

## 2016-11-26 DIAGNOSIS — L89620 Pressure ulcer of left heel, unstageable: Secondary | ICD-10-CM | POA: Diagnosis not present

## 2016-11-26 DIAGNOSIS — G6281 Critical illness polyneuropathy: Secondary | ICD-10-CM | POA: Diagnosis not present

## 2016-11-26 DIAGNOSIS — Z8249 Family history of ischemic heart disease and other diseases of the circulatory system: Secondary | ICD-10-CM

## 2016-11-26 DIAGNOSIS — Z95810 Presence of automatic (implantable) cardiac defibrillator: Secondary | ICD-10-CM

## 2016-11-26 DIAGNOSIS — J9811 Atelectasis: Secondary | ICD-10-CM

## 2016-11-26 DIAGNOSIS — Z7984 Long term (current) use of oral hypoglycemic drugs: Secondary | ICD-10-CM

## 2016-11-26 DIAGNOSIS — J9601 Acute respiratory failure with hypoxia: Secondary | ICD-10-CM

## 2016-11-26 DIAGNOSIS — T8149XA Infection following a procedure, other surgical site, initial encounter: Secondary | ICD-10-CM

## 2016-11-26 DIAGNOSIS — Z9911 Dependence on respirator [ventilator] status: Secondary | ICD-10-CM

## 2016-11-26 DIAGNOSIS — T8132XA Disruption of internal operation (surgical) wound, not elsewhere classified, initial encounter: Secondary | ICD-10-CM | POA: Diagnosis not present

## 2016-11-26 DIAGNOSIS — K279 Peptic ulcer, site unspecified, unspecified as acute or chronic, without hemorrhage or perforation: Secondary | ICD-10-CM

## 2016-11-26 DIAGNOSIS — E860 Dehydration: Secondary | ICD-10-CM | POA: Diagnosis present

## 2016-11-26 DIAGNOSIS — E1151 Type 2 diabetes mellitus with diabetic peripheral angiopathy without gangrene: Secondary | ICD-10-CM | POA: Diagnosis present

## 2016-11-26 DIAGNOSIS — E872 Acidosis: Secondary | ICD-10-CM | POA: Diagnosis present

## 2016-11-26 DIAGNOSIS — E118 Type 2 diabetes mellitus with unspecified complications: Secondary | ICD-10-CM

## 2016-11-26 DIAGNOSIS — I878 Other specified disorders of veins: Secondary | ICD-10-CM

## 2016-11-26 DIAGNOSIS — K255 Chronic or unspecified gastric ulcer with perforation: Secondary | ICD-10-CM | POA: Diagnosis present

## 2016-11-26 DIAGNOSIS — K265 Chronic or unspecified duodenal ulcer with perforation: Secondary | ICD-10-CM | POA: Diagnosis present

## 2016-11-26 DIAGNOSIS — I255 Ischemic cardiomyopathy: Secondary | ICD-10-CM | POA: Diagnosis present

## 2016-11-26 DIAGNOSIS — I493 Ventricular premature depolarization: Secondary | ICD-10-CM | POA: Diagnosis present

## 2016-11-26 DIAGNOSIS — R0603 Acute respiratory distress: Secondary | ICD-10-CM

## 2016-11-26 DIAGNOSIS — I38 Endocarditis, valve unspecified: Secondary | ICD-10-CM | POA: Diagnosis present

## 2016-11-26 DIAGNOSIS — J90 Pleural effusion, not elsewhere classified: Secondary | ICD-10-CM

## 2016-11-26 DIAGNOSIS — I509 Heart failure, unspecified: Secondary | ICD-10-CM

## 2016-11-26 DIAGNOSIS — R7401 Elevation of levels of liver transaminase levels: Secondary | ICD-10-CM

## 2016-11-26 DIAGNOSIS — B9681 Helicobacter pylori [H. pylori] as the cause of diseases classified elsewhere: Secondary | ICD-10-CM | POA: Diagnosis present

## 2016-11-26 DIAGNOSIS — R109 Unspecified abdominal pain: Secondary | ICD-10-CM

## 2016-11-26 DIAGNOSIS — R509 Fever, unspecified: Secondary | ICD-10-CM

## 2016-11-26 DIAGNOSIS — R0682 Tachypnea, not elsewhere classified: Secondary | ICD-10-CM

## 2016-11-26 LAB — COMPREHENSIVE METABOLIC PANEL
ALK PHOS: 86 U/L (ref 38–126)
ALT: 20 U/L (ref 17–63)
ANION GAP: 19 — AB (ref 5–15)
AST: 36 U/L (ref 15–41)
Albumin: 4.2 g/dL (ref 3.5–5.0)
BILIRUBIN TOTAL: 3 mg/dL — AB (ref 0.3–1.2)
BUN: 43 mg/dL — ABNORMAL HIGH (ref 6–20)
CALCIUM: 9.2 mg/dL (ref 8.9–10.3)
CO2: 23 mmol/L (ref 22–32)
CREATININE: 1.99 mg/dL — AB (ref 0.61–1.24)
Chloride: 92 mmol/L — ABNORMAL LOW (ref 101–111)
GFR, EST AFRICAN AMERICAN: 37 mL/min — AB (ref >60)
GFR, EST NON AFRICAN AMERICAN: 32 mL/min — AB (ref >60)
Glucose, Bld: 258 mg/dL — ABNORMAL HIGH (ref 65–99)
Potassium: 3.1 mmol/L — ABNORMAL LOW (ref 3.5–5.1)
Sodium: 134 mmol/L — ABNORMAL LOW (ref 135–145)
TOTAL PROTEIN: 7.9 g/dL (ref 6.5–8.1)

## 2016-11-26 LAB — CBG MONITORING, ED: Glucose-Capillary: 199 mg/dL — ABNORMAL HIGH (ref 65–99)

## 2016-11-26 LAB — CBC
HCT: 47.9 % (ref 39.0–52.0)
HEMOGLOBIN: 17.4 g/dL — AB (ref 13.0–17.0)
MCH: 35.7 pg — AB (ref 26.0–34.0)
MCHC: 36.3 g/dL — AB (ref 30.0–36.0)
MCV: 98.4 fL (ref 78.0–100.0)
PLATELETS: 240 10*3/uL (ref 150–400)
RBC: 4.87 MIL/uL (ref 4.22–5.81)
RDW: 12.9 % (ref 11.5–15.5)
WBC: 7.6 10*3/uL (ref 4.0–10.5)

## 2016-11-26 LAB — PROTIME-INR
INR: 1.23
Prothrombin Time: 15.6 seconds — ABNORMAL HIGH (ref 11.4–15.2)

## 2016-11-26 LAB — I-STAT TROPONIN, ED: TROPONIN I, POC: 0.08 ng/mL (ref 0.00–0.08)

## 2016-11-26 LAB — LIPASE, BLOOD: Lipase: 109 U/L — ABNORMAL HIGH (ref 11–51)

## 2016-11-26 LAB — POC OCCULT BLOOD, ED: FECAL OCCULT BLD: POSITIVE — AB

## 2016-11-26 LAB — I-STAT CG4 LACTIC ACID, ED: LACTIC ACID, VENOUS: 9.49 mmol/L — AB (ref 0.5–1.9)

## 2016-11-26 MED ORDER — IOPAMIDOL (ISOVUE-300) INJECTION 61%
30.0000 mL | Freq: Once | INTRAVENOUS | Status: AC | PRN
  Administered 2016-11-26: 30 mL via ORAL

## 2016-11-26 MED ORDER — MORPHINE SULFATE (PF) 4 MG/ML IV SOLN: 4.0000 mg | Freq: Once | INTRAVENOUS | Status: DC

## 2016-11-26 MED ORDER — SODIUM CHLORIDE 0.9 % IV BOLUS (SEPSIS)
1000.0000 mL | Freq: Once | INTRAVENOUS | Status: AC
  Administered 2016-11-26: 1000 mL via INTRAVENOUS

## 2016-11-26 MED ORDER — ONDANSETRON HCL 4 MG/2ML IJ SOLN
4.0000 mg | Freq: Once | INTRAMUSCULAR | Status: DC
  Filled 2016-11-26: qty 2

## 2016-11-26 MED ORDER — IOPAMIDOL (ISOVUE-300) INJECTION 61%
INTRAVENOUS | Status: AC
  Administered 2016-11-26: 30 mL via ORAL
  Filled 2016-11-26: qty 30

## 2016-11-26 MED ORDER — SODIUM CHLORIDE 0.9 % IV BOLUS (SEPSIS)
1000.0000 mL | Freq: Once | INTRAVENOUS | Status: AC
  Administered 2016-11-27: 1000 mL via INTRAVENOUS

## 2016-11-26 MED ORDER — POTASSIUM CHLORIDE 10 MEQ/100ML IV SOLN
10.0000 meq | INTRAVENOUS | Status: AC
  Administered 2016-11-26 – 2016-11-27 (×3): 10 meq via INTRAVENOUS
  Filled 2016-11-26 (×3): qty 100

## 2016-11-26 NOTE — ED Provider Notes (Signed)
[facility] DEPT Provider Note   CSN: [id number] Arrival date & time: 11/26/16  1501     History   Chief Complaint Chief Complaint  Patient presents with  . Abdominal Pain    HPI Jacob Pittman is a 71 y.o. male past medical history significant for non-insulin-dependent diabetes mellitus, CHF, hyperlipidemia, hypertension, PAD, who presents to the emergency department today for lower abdominal pain 3 days. The patient notes that 3 days ago he started having a mild, intermittent, achy right lower quadrant abdominal pain. No radiation. Pain has not changed in location. Over the last day this is increased in severity and is now constant. The patient states that yesterday he started having nausea and vomiting. This is nonbilious, nonbloody vomiting. He states that this has happened greater than 5 times. He has not been able to hold down by mouth fluids since yesterday. The patient does note that he has had no bowel movement in the last 2 weeks. He has a distended abdomen. Notes umbilical hernia that is reducible. He notes that when lying down and "hiccupping" this causes sob. No sob at rest, doe, chest pain, diaphoresis cough, leg swelling. The patient has not had any abdominal surgeries. No sick contacts or persons with similar symptoms. Patient denies fever, chills, diarrhea, hematochezia, melena. No testicular pain, penile pain, pale discharge, urgency, hematuria, dysuria, retention. He states he is not taking his medications since yesterday as he has not been able to hold down any fluid.  HPI  Past Medical History:  Diagnosis Date  . ANXIETY 02/04/2009  . CHF (congestive heart failure) ([place])   . Chronic systolic CHF (congestive heart failure) ([place])   . DIABETES MELLITUS, TYPE II 11/07/2008  . 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 HIP PAIN, RIGHT 11/06/2009  . HYPERLIPIDEMIA 11/07/2008  . HYPERTENSION 10/08/2008  . Nonischemic cardiomyopathy [facility])    s/p ICD implantation 03-2013 by  Dr Lovena Le  . OSTEOARTHRITIS, KNEE, RIGHT 10/08/2008  . Peripheral arterial disease [facility])     Patient Active Problem List   Diagnosis Date Noted  . Elevated PSA 02/07/2016  . Contact dermatitis 07/23/2015  . History of colonic polyps 07/15/2015  . Venous stasis ulcer of left lower extremity ([place]) 01/21/2015  . Left arm swelling 01/03/2015  . Dyspnea 01/03/2015  . Irregular heart beats 01/03/2015  . Wheezing 07/19/2014  . Peripheral arterial disease ([place]) 10/31/2013  . Conjunctivitis 09/22/2013  . Allergic rhinitis 09/22/2013  . ICD (implantable cardioverter-defibrillator), single, in situ 07/12/2013  . Chronic combined systolic and diastolic heart failure, NYHA class 3 ([place]) 04/10/2013  . Nonischemic cardiomyopathy ([place])   . Acute on chronic combined systolic and diastolic CHF (congestive heart failure) ([place]) 09/30/2012  . History of alcohol use 09/30/2012  . Back pain 10/08/2011  . 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 Preventative health care 08/17/2010  . HIP PAIN, RIGHT 11/06/2009  . ANXIETY 02/04/2009  . Diabetes ([place]) 11/07/2008  . Hyperlipidemia 11/07/2008  . 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 Essential hypertension 10/08/2008  . OSTEOARTHRITIS, KNEE, RIGHT 10/08/2008  . FATIGUE 10/08/2008    Past Surgical History:  Procedure Laterality Date  . CARDIAC DEFIBRILLATOR PLACEMENT Left 04/10/13   BTK single chamber ICD implanted by Dr Lovena Le for primary prevention  . IMPLANTABLE CARDIOVERTER DEFIBRILLATOR IMPLANT N/A 04/10/2013   Procedure: IMPLANTABLE CARDIOVERTER DEFIBRILLATOR IMPLANT;  Surgeon: Evans Lance, MD;  Location[facility] C[facility] LAB;  Service: Cardiovascular;  Laterality: N/A;  . s/p left broken arm with pin     1980's  Home Medications    Prior to Admission medications   Medication Sig Start Date End Date Taking? Authorizing Provider  albuterol (PROVENTIL HFA;VENTOLIN HFA) 108 (90 BASE) MCG/ACT inhaler Inhale 2 puffs into the lungs every 6  (six) hours as needed for wheezing or shortness of breath. 07/19/14   Biagio Borg, MD  aspirin 81 MG EC tablet Take 81 mg by mouth daily.      [provider]  Blood Glucose Monitoring Suppl (ONE TOUCH ULTRA SYSTEM KIT) w/Device KIT Use as directed daily 09/10/16   Biagio Borg, MD  carvedilol (COREG) 12.5 MG tablet TAKE ONE TABLET BY MOUTH TWICE DAILY 04/27/16   Martinique, Peter M, MD  cetirizine (ZYRTEC) 10 MG tablet Take 1 tablet (10 mg total) by mouth daily. 09/22/13   Biagio Borg, MD  fluticasone (FLONASE) 50 MCG/ACT nasal spray Place 2 sprays into both nostrils daily. 09/22/13   Biagio Borg, MD  furosemide (LASIX) 40 MG tablet Take 1 tablet (40 mg total) by mouth daily. 07/08/16 10/15/16  [place], Peter M, MD  glucose blood test strip Use as instructed 09/10/16   Biagio Borg, MD  Lancets MISC Use as directed once daily 09/10/16   Biagio Borg, MD  metFORMIN (GLUCOPHAGE-XR) 500 MG 24 hr tablet TAKE TWO TABLETS BY MOUTH ONCE DAILY IN THE MORNING 03/18/16   Biagio Borg, MD  metolazone (ZAROXOLYN) 2.5 MG tablet TAKE ONE TABLET BY MOUTH EVERY OTHER DAY 01/10/16   [place]ique, Peter M, MD  rosuvastatin (CRESTOR) 20 MG tablet Take 1 tablet (20 mg total) by mouth daily. 09/10/16   Biagio Borg, MD  sildenafil (REVATIO) 20 MG tablet Take 1 tablet (20 mg total) by mouth as directed. Take 2-4 tablets by mouth as needed 10/15/16   Evans Lance, MD  silver sulfADIAZINE (SILVADENE) 1 % cream Apply 1 application topically daily. 10/16/15   Tuchman, Leslye Peer, DPM  spironolactone (ALDACTONE) 25 MG tablet Take 0.5 tablets (12.5 mg total) by mouth daily. 06/08/16   [place], Peter M, MD  Tadalafil 2.5 MG TABS Take 1 tablet (2.5 mg total) by mouth daily as needed. 03/04/15   Brett Canales, PA-C    Family History Family History  Problem Relation Age of Onset  . Diabetes Mother   . Hypertension Mother     Social History Social History  Substance Use Topics  . Smoking status: Former Research scientist (life sciences)  . Smokeless  tobacco: Never Used  . Alcohol use Yes     Comment: 1 pint daily     Allergies   Patient has no known allergies.   Review of Systems Review of Systems  All other systems reviewed and are negative.    Physical Exam Updated Vital Signs BP 90/62 (BP Location: Left Arm)   Pulse 96   Temp (!) 97.5 F (36.4 C) (Oral)   Resp 20   SpO2 (!) 88%   Physical Exam  Constitutional: He appears well-developed and well-nourished.  HENT:  Head: Normocephalic and atraumatic.  Right Ear: External ear normal.  Left Ear: External ear normal.  Nose: Nose normal.  Mouth/Throat: Oropharynx is clear and moist.  Eyes: Pupils are equal, round, and reactive to light. EOM are normal. Right eye exhibits no discharge. Left eye exhibits no discharge. Right conjunctiva is not injected. Left conjunctiva is not injected. Scleral icterus (mild) is present. Right eye exhibits normal extraocular motion. Left eye exhibits normal extraocular motion.  Neck: Normal range of motion. Neck supple. No  spinous process tenderness present. No neck rigidity. Normal range of motion present.  No nuchal rigidity  Cardiovascular: Normal rate, regular rhythm and intact distal pulses.   No murmur heard. Pulses:      Radial pulses are 2+ on the right side, and 2+ on the left side.       Femoral pulses are 2+ on the right side, and 2+ on the left side.      Dorsalis pedis pulses are 2+ on the right side, and 2+ on the left side.       Posterior tibial pulses are 2+ on the right side, and 2+ on the left side.  No lower extremity swelling or edema. Calves symmetric in size bilaterally.  Pulmonary/Chest: Effort normal and breath sounds normal. He exhibits no tenderness.  Abdominal: Soft. Bowel sounds are normal. He exhibits distension. He exhibits no pulsatile midline mass. There is tenderness in the right lower quadrant, suprapubic area and left lower quadrant. There is no rebound, no guarding and no CVA tenderness.  Umbilical  hernia. Bowel present.   Musculoskeletal: He exhibits no edema.  Lymphadenopathy:    He has no cervical adenopathy.  Neurological: He is alert.  Skin: Skin is warm and dry. No rash noted. He is not diaphoretic.  Psychiatric: He has a normal mood and affect.  Nursing note and vitals reviewed.    ED Treatments / Results  Labs (all labs ordered are listed, but only abnormal results are displayed) Labs Reviewed  LIPASE, BLOOD - Abnormal; Notable for the following:       Result Value   Lipase 109 (*)    All other components within normal limits  COMPREHENSIVE METABOLIC PANEL - Abnormal; Notable for the following:    Sodium 134 (*)    Potassium 3.1 (*)    Chloride 92 (*)    Glucose, Bld 258 (*)    BUN 43 (*)    Creatinine, Ser 1.99 (*)    Total Bilirubin 3.0 (*)    GFR calc non Af Amer 32 (*)    GFR calc Af Amer 37 (*)    Anion gap 19 (*)    All other components within normal limits  CBC - Abnormal; Notable for the following:    Hemoglobin 17.4 (*)    MCH 35.7 (*)    MCHC 36.3 (*)    All other components within normal limits  URINALYSIS, ROUTINE W REFLEX MICROSCOPIC  I-STAT CG4 LACTIC ACID, ED    EKG  EKG Interpretation None       Radiology No results found.  Procedures Procedures (including critical care time)  Medications Ordered in ED Medications  sodium chloride 0.9 % bolus 1,000 mL (not administered)     Initial Impression / Assessment and Plan / ED Course  I have reviewed the triage vital signs and the nursing notes.  Pertinent labs & imaging results that were available during my care of the patient were reviewed by me and considered in my medical decision making (see chart for details).  Clinical Course as of Nov 27 2330  Thu Nov 26, 2016  2328 Lactic Acid, Venous: (!!) 9.49 [HM]  2328 WNL WBC: 7.6 [HM]  2329 Troponin i, poc: 0.08 [HM]  2329 IV replacement ordered Potassium: (!) 3.1 [HM]  2329 Elevated Total Bilirubin: (!) 3.0 [HM]  2329 DRE  with brown stool in the rectal vault, fecal occult positive  [HM]    Clinical Course User Index [HM] Muthersbaugh, Jarrett Soho, PA-C  71 year old male presenting today with lower abdominal pain. Patient states this is localized to the right lower quadrant. On presentation the patient is with temperature of 97.5, heart rate 96, blood pressure 90/62, respiration rate 20 and SPO2 88. I-STAT lactic acid ordered. Labs ordered in triage. Exam with tenderness of the lower abdomen and distension. CT ordered with oral contrast as patient has AKI and GFR of 37.   Labs with K 3.1. Replaced IV until PO tolerated. Anion gap elevated at 19. Will get lactic acid to determine cause. BUN and Cr elevated, with AKI. CBC without leukocytosis.   Repeat vitals show patient show patient is now tachypnea, tachycardic, hypotensive. Upon visualation of the patient the patient is short of breath with increased work of breathing, diaphoretic. 2 IV started and IVF bolus given. EKG, Tn, port CXR, port KUB ordered. Bedside US performed by Dr. Leonette Monarch with poor visualization of the aorta. CTA of chest, abdomen and pelvis ordered to r/o MI. DRE with occult blood. DRE with hard stool in rectal vault. No blood or melena noted visually. I-Stat lactic acid 9.49.   Pending CT patient case signed out [facility], PA-C who has followed the case after repeat vitals changed as above. BP improving at time of sign out at 93/71.   Final Clinical Impressions(s) / ED Diagnoses   Final diagnoses:  None    New Prescriptions New Prescriptions   No medications on file     Lorelle Gibbs 11/26/16 2348

## 2016-11-26 NOTE — ED Notes (Addendum)
Pt has a lactic of 9.49 EDP [place] made aware.

## 2016-11-26 NOTE — ED Provider Notes (Signed)
Care assumed from [facility], PA-C.  Jacob Pittman is a 71 y.o. male presents with several days of abd pain with nausea and vomiting today. On initial provider exam, pt abd was distended and tender, but not rigid.  During his stay in the ED he became hypotensive, tachypneic and hypoxic.  Pt is taking ASA, but is not anticoagulated.  He has a hx of pacemaker insertion, but no abdominal surgeries.  No recent travel or sick contacts. Patient reports no bowel movement in the last 2 weeks.   Physical Exam  BP (!) 83/39 (BP Location: Left Arm)   Pulse (!) 105   Temp (!) 97.5 F (36.4 C) (Oral)   Resp 18   SpO2 (!) 88%   Physical Exam  Constitutional: He appears well-developed and well-nourished. He appears distressed.  HENT:  Head: Normocephalic.  Eyes: Conjunctivae are normal. No scleral icterus.  Neck: Normal range of motion.  Cardiovascular: Normal rate and intact distal pulses.   Pulses:      Radial pulses are 1+ on the right side, and 1+ on the left side.  Thready radial pulses  Pulmonary/Chest: Tachypnea noted. He is in respiratory distress. He has decreased breath sounds.  Hypoxic to 88% on room air Lungs without wheezes, rhonchi or rales  Abdominal: He exhibits distension. There is generalized tenderness. There is rigidity. A hernia is present.  Significant abd distension  Umbilical hernia is soft, but not completely reducible  Musculoskeletal: Normal range of motion. He exhibits no edema.  Neurological: He is alert.  Skin: He is diaphoretic.  Cool and clammy   Nursing note and vitals reviewed.   ED Course  Procedure Name: Intubation Date/Time: 11/27/2016 2:50 AM Performed by: Abigail Butts Pre-anesthesia Checklist: Patient identified, Emergency Drugs available, Patient being monitored, Timeout performed and Suction available Oxygen Delivery Method: Non-rebreather mask Preoxygenation: Pre-oxygenation with 100% oxygen Induction Type: Rapid  sequence Ventilation: Mask ventilation without difficulty Laryngoscope Size: Mac and 3 Grade View: Grade III Tube size: 7.5 mm Number of attempts: 1 Airway Equipment and Method: Video-laryngoscopy Placement Confirmation: ETT inserted through vocal cords under direct vision,  CO2 detector and Breath sounds checked- equal and bilateral Secured at: 23 cm Tube secured with: ETT holder Dental Injury: Teeth and Oropharynx as per pre-operative assessment          EKG Interpretation  Date/Time:  Thursday November 26 2016 22:57:20 EDT Ventricular Rate:  75 PR Interval:    QRS Duration: 171 QT Interval:  517 QTC Calculation: 578 R Axis:   -88 Text Interpretation:  Atrial fibrillation Ventricular premature complex RBBB and LAFB Artifact in lead(s) I III aVL V5 No significant change since last tracing Confirmed by Addison Lank [phone]) on 11/26/2016 11:13:59 PM        CRITICAL CARE Performed by: Abigail Butts Total critical care time: 60 minutes Critical care time was exclusive of separately billable procedures and treating other patients. Critical care was necessary to treat or prevent imminent or life-threatening deterioration. Critical care was time spent personally by me on the following activities: development of treatment plan with patient and/or surrogate as well as nursing, discussions with consultants, evaluation of patient's response to treatment, examination of patient, obtaining history from patient or surrogate, ordering and performing treatments and interventions, ordering and review of laboratory studies, ordering and review of radiographic studies, pulse oximetry and re-evaluation of patient's condition.  EMERGENCY DEPARTMENT ULTRASOUND  Study: Limited Retroperitoneal Ultrasound of the Abdominal Aorta.  INDICATIONS:Abnormal vital signs, Abdominal pain and Age>55 Multiple  views of the abdominal aorta were obtained in real-time from the diaphragmatic hiatus to the  aortic bifurcation in transverse planes with a multi-frequency probe.  PERFORMED BY: Other - Dr. Leonette Monarch IMAGES ARCHIVED?: Yes LIMITATIONS:  Body habitus and Bowel gas INTERPRETATION:  unable to visualize the aorta     Clinical Course as of Nov 28 254  Thu Nov 26, 2016  2328 Lactic Acid, Venous: (!!) 9.49 [HM]  2328 WNL WBC: 7.6 [HM]  2329 Troponin i, poc: 0.08 [HM]  2329 IV replacement ordered Potassium: (!) 3.1 [HM]  2329 Elevated Total Bilirubin: (!) 3.0 [HM]  2329 DRE with brown stool in the rectal vault, fecal occult positive  [HM]  2346 Discussed with Dr. Kathlene Cote who reports possible free air in the abd  [HM]  2347 Repeat abd exam with increasing pain, distension and rigidity   [HM]  Fri Nov 27, 2016  0058 CT with evidence of bowel perforation.  Will consult surgery.    [HM]    Clinical Course User Index [HM] Xian Alves, Gwenlyn Perking       Results for orders placed or performed during the hospital encounter of 11/26/16  Lipase, blood  Result Value Ref Range   Lipase 109 (H) 11 - 51 U/L  Comprehensive metabolic panel  Result Value Ref Range   Sodium 134 (L) 135 - 145 mmol/L   Potassium 3.1 (L) 3.5 - 5.1 mmol/L   Chloride 92 (L) 101 - 111 mmol/L   CO2 23 22 - 32 mmol/L   Glucose, Bld 258 (H) 65 - 99 mg/dL   BUN 43 (H) 6 - 20 mg/dL   Creatinine, Ser 1.99 (H) 0.61 - 1.24 mg/dL   Calcium 9.2 8.9 - 10.3 mg/dL   Total Protein 7.9 6.5 - 8.1 g/dL   Albumin 4.2 3.5 - 5.0 g/dL   AST 36 15 - 41 U/L   ALT 20 17 - 63 U/L   Alkaline Phosphatase 86 38 - 126 U/L   Total Bilirubin 3.0 (H) 0.3 - 1.2 mg/dL   GFR calc non Af Amer 32 (L) >60 mL/min   GFR calc Af Amer 37 (L) >60 mL/min   Anion gap 19 (H) 5 - 15  CBC  Result Value Ref Range   WBC 7.6 4.0 - 10.5 K/uL   RBC 4.87 4.22 - 5.81 MIL/uL   Hemoglobin 17.4 (H) 13.0 - 17.0 g/dL   HCT 47.9 39.0 - 52.0 %   MCV 98.4 78.0 - 100.0 fL   MCH 35.7 (H) 26.0 - 34.0 pg   MCHC 36.3 (H) 30.0 - 36.0 g/dL   RDW 12.9 11.5 - 15.5  %   Platelets 240 150 - 400 K/uL  Protime-INR  Result Value Ref Range   Prothrombin Time 15.6 (H) 11.4 - 15.2 seconds   INR 1.23   Lactic acid, plasma  Result Value Ref Range   Lactic Acid, Venous 7.5 (HH) 0.5 - 1.9 mmol/L  I-Stat CG4 Lactic Acid, ED  Result Value Ref Range   Lactic Acid, Venous 9.49 (HH) 0.5 - 1.9 mmol/L   Comment NOTIFIED PHYSICIAN   CBG monitoring, ED  Result Value Ref Range   Glucose-Capillary 199 (H) 65 - 99 mg/dL  I-stat troponin, ED  Result Value Ref Range   Troponin i, poc 0.08 0.00 - 0.08 ng/mL   Comment 3          POC occult blood, ED Provider will collect  Result Value Ref Range   Fecal Occult Bld POSITIVE (A)  NEGATIVE  Type and screen  Result Value Ref Range   ABO/RH(D) O POS    Antibody Screen NEG    Sample Expiration 11/29/2016   ABO/Rh  Result Value Ref Range   ABO/RH(D) O POS    Dg Chest Portable 1 View  Result Date: 11/27/2016 CLINICAL DATA:  Respiratory failure.  Post intubation. EXAM: PORTABLE CHEST 1 VIEW COMPARISON:  11/26/2016 FINDINGS: Low lung volumes with stable cardiomegaly. Aortic atherosclerosis. ICD device projects over the left axilla with lead projecting over the right ventricle. New endotracheal tube is noted with tip 4 cm above the carina. Mild perihilar vascular congestion is seen with slight increase in airspace confluence in the right upper lobe. No acute nor suspicious osseous abnormalities. Cervical and thoracic spondylosis. IMPRESSION: 1. Low lung volumes with stable cardiomegaly and aortic atherosclerosis. Mild central vascular congestion. 2. Endotracheal tube tip is 4 cm above the carina in satisfactory position. Electronically Signed   By: Ashley Royalty M.D.   On: 11/27/2016 02:43   Dg Chest Port 1 View  Result Date: 11/26/2016 CLINICAL DATA:  Difficulty breathing. EXAM: PORTABLE CHEST 1 VIEW COMPARISON:  01/03/2015 FINDINGS: Stable mild cardiomegaly. Stable appearance of single lead ICD. Lungs show low volumes with  bibasilar atelectasis, right greater than left. No overt edema or pleural fluid. IMPRESSION: Low bilateral lung volumes with bibasilar atelectasis. Electronically Signed   By: Aletta Edouard M.D.   On: 11/26/2016 23:41   Dg Abd Portable 2v  Result Date: 11/26/2016 CLINICAL DATA:  Abdominal pain, distention, nausea and vomiting. EXAM: PORTABLE ABDOMEN - 2 VIEW COMPARISON:  None. FINDINGS: There is a large amount of fecal material throughout the colon. There is a collection of amorphous gas in the midline mid upper abdomen on the supine film. On the decubitus film there is some air outlining the lateral margin of the liver. There is associated air-fluid level. While this may be an a distended loop of bowel, free intraperitoneal air cannot be excluded and there may be some free fluid in the abdomen is well. CT of the abdomen and pelvis would be needed to exclude perforated viscus. No abnormal calcifications are seen. Diffuse degenerative disease of the lumbar spine present. IMPRESSION: Large amount of fecal material in the colon. Amorphous gas in the midline mid to upper abdomen on the supine film. On the decubitus film, there is an elongated air-fluid level with visualization of the lateral margin of the liver. Free intraperitoneal air cannot be excluded as well as free fluid in the peritoneal cavity. Recommend correlation with CT of the abdomen and pelvis. These results were called by telephone at the time of interpretation on 11/26/2016 at 11:48 pm to [facility], PA-C, who verbally acknowledged these results. Electronically Signed   By: Aletta Edouard M.D.   On: 11/26/2016 23:49      MDM Patient presents emergency department with abdominal pain nausea and vomiting. At shift change, patient was signed out to me.  At that time he was hypotensive, tachypneic and hypoxic. Sepsis protocol initiated. Initial lactic acid 9.49.  Patient's abdomen was distended and rigid. Concern for bowel perforation  versus mesenteric ischemia versus AAA. Unable to visualize patient's aorta on bedside ultrasound. Patient takes aspirin but is not anticoagulated. Normal INR.  Patient given 30 mLs per kilogram of fluid, vancomycin and Zosyn.  Patient with a history of CHF and nonischemic cardiomyopathy. Record review shows last echo was 2016 with an EF of 15% and global LV hypokinesis. Patient continues to be hypoxic  despite supplemental oxygen. Reassessment shows wheezing and rales in the lungs. Patient intubated for secure airway and for improved oxygenation. Hypotension persists despite adequate fluid or septation. Levophed drip initiated. Patient is critically ill. CT scan appears to have bowel perforation. Surgery was consulted. Patient was taken emergently to the operating room.  The patient was discussed with and seen by Dr. Leonides Schanz and Dr. Leonette Monarch who agree with the treatment plan.    Bowel perforation ([place])  Abdominal pain - Plan: DG Abd Portable 2V, DG Abd Portable 2V  Hypotension - Plan: DG Abd Portable 2V, DG Abd Portable 2V  Sepsis, due to unspecified organism ([place])  Acute respiratory failure with hypoxia ([place])      Tameem Pullara, Jarrett Soho, PA-C 11/27/16 0300

## 2016-11-26 NOTE — ED Triage Notes (Signed)
Pt's family reports pt is c/o lower abd pain with hiccups since yesterday.  Reports LBM x 2 weeks ago, reports passing flatus and belching constantly.  He reports n/v as well.  Pt's abd appears distended and tight, causing diff breathing.

## 2016-11-27 ENCOUNTER — Emergency Department (HOSPITAL_COMMUNITY): Payer: Medicare PPO

## 2016-11-27 ENCOUNTER — Inpatient Hospital Stay (HOSPITAL_COMMUNITY): Payer: Medicare PPO

## 2016-11-27 ENCOUNTER — Encounter (HOSPITAL_COMMUNITY)
Admission: EM | Disposition: A | Discharge status: Nursing Home (Includes ICF) | Payer: Self-pay | Source: Home / Self Care | Attending: Pulmonary Disease

## 2016-11-27 ENCOUNTER — Encounter (HOSPITAL_COMMUNITY): Payer: Self-pay

## 2016-11-27 ENCOUNTER — Emergency Department (HOSPITAL_COMMUNITY): Payer: Medicare PPO | Admitting: Anesthesiology

## 2016-11-27 DIAGNOSIS — J9600 Acute respiratory failure, unspecified whether with hypoxia or hypercapnia: Secondary | ICD-10-CM | POA: Diagnosis not present

## 2016-11-27 DIAGNOSIS — I739 Peripheral vascular disease, unspecified: Secondary | ICD-10-CM | POA: Diagnosis not present

## 2016-11-27 DIAGNOSIS — K650 Generalized (acute) peritonitis: Secondary | ICD-10-CM | POA: Diagnosis not present

## 2016-11-27 DIAGNOSIS — M7989 Other specified soft tissue disorders: Secondary | ICD-10-CM | POA: Diagnosis not present

## 2016-11-27 DIAGNOSIS — N179 Acute kidney failure, unspecified: Secondary | ICD-10-CM | POA: Diagnosis not present

## 2016-11-27 DIAGNOSIS — Z515 Encounter for palliative care: Secondary | ICD-10-CM | POA: Diagnosis not present

## 2016-11-27 DIAGNOSIS — R579 Shock, unspecified: Secondary | ICD-10-CM | POA: Diagnosis not present

## 2016-11-27 DIAGNOSIS — I998 Other disorder of circulatory system: Secondary | ICD-10-CM | POA: Diagnosis not present

## 2016-11-27 DIAGNOSIS — K255 Chronic or unspecified gastric ulcer with perforation: Secondary | ICD-10-CM | POA: Diagnosis not present

## 2016-11-27 DIAGNOSIS — Q438 Other specified congenital malformations of intestine: Secondary | ICD-10-CM | POA: Diagnosis not present

## 2016-11-27 DIAGNOSIS — E872 Acidosis: Secondary | ICD-10-CM | POA: Diagnosis not present

## 2016-11-27 DIAGNOSIS — R740 Nonspecific elevation of levels of transaminase and lactic acid dehydrogenase [LDH]: Secondary | ICD-10-CM | POA: Diagnosis not present

## 2016-11-27 DIAGNOSIS — T07XXXA Unspecified multiple injuries, initial encounter: Secondary | ICD-10-CM | POA: Diagnosis not present

## 2016-11-27 DIAGNOSIS — Y92230 Patient room in hospital as the place of occurrence of the external cause: Secondary | ICD-10-CM | POA: Diagnosis not present

## 2016-11-27 DIAGNOSIS — Z9889 Other specified postprocedural states: Secondary | ICD-10-CM | POA: Diagnosis not present

## 2016-11-27 DIAGNOSIS — B9681 Helicobacter pylori [H. pylori] as the cause of diseases classified elsewhere: Secondary | ICD-10-CM | POA: Diagnosis not present

## 2016-11-27 DIAGNOSIS — I38 Endocarditis, valve unspecified: Secondary | ICD-10-CM | POA: Diagnosis not present

## 2016-11-27 DIAGNOSIS — T82868A Thrombosis of vascular prosthetic devices, implants and grafts, initial encounter: Secondary | ICD-10-CM | POA: Diagnosis not present

## 2016-11-27 DIAGNOSIS — Z7189 Other specified counseling: Secondary | ICD-10-CM | POA: Diagnosis not present

## 2016-11-27 DIAGNOSIS — I5022 Chronic systolic (congestive) heart failure: Secondary | ICD-10-CM | POA: Diagnosis not present

## 2016-11-27 DIAGNOSIS — E870 Hyperosmolality and hypernatremia: Secondary | ICD-10-CM | POA: Diagnosis not present

## 2016-11-27 DIAGNOSIS — K651 Peritoneal abscess: Secondary | ICD-10-CM | POA: Diagnosis not present

## 2016-11-27 DIAGNOSIS — E43 Unspecified severe protein-calorie malnutrition: Secondary | ICD-10-CM | POA: Diagnosis not present

## 2016-11-27 DIAGNOSIS — I471 Supraventricular tachycardia: Secondary | ICD-10-CM | POA: Diagnosis not present

## 2016-11-27 DIAGNOSIS — I361 Nonrheumatic tricuspid (valve) insufficiency: Secondary | ICD-10-CM | POA: Diagnosis not present

## 2016-11-27 DIAGNOSIS — R6521 Severe sepsis with septic shock: Secondary | ICD-10-CM | POA: Diagnosis not present

## 2016-11-27 DIAGNOSIS — I351 Nonrheumatic aortic (valve) insufficiency: Secondary | ICD-10-CM | POA: Diagnosis not present

## 2016-11-27 DIAGNOSIS — I5021 Acute systolic (congestive) heart failure: Secondary | ICD-10-CM | POA: Diagnosis not present

## 2016-11-27 DIAGNOSIS — I481 Persistent atrial fibrillation: Secondary | ICD-10-CM | POA: Diagnosis not present

## 2016-11-27 DIAGNOSIS — R7881 Bacteremia: Secondary | ICD-10-CM | POA: Diagnosis not present

## 2016-11-27 DIAGNOSIS — I6340 Cerebral infarction due to embolism of unspecified cerebral artery: Secondary | ICD-10-CM | POA: Diagnosis not present

## 2016-11-27 DIAGNOSIS — T814XXA Infection following a procedure, initial encounter: Secondary | ICD-10-CM | POA: Diagnosis not present

## 2016-11-27 DIAGNOSIS — K265 Chronic or unspecified duodenal ulcer with perforation: Secondary | ICD-10-CM | POA: Diagnosis present

## 2016-11-27 DIAGNOSIS — I110 Hypertensive heart disease with heart failure: Secondary | ICD-10-CM | POA: Diagnosis not present

## 2016-11-27 DIAGNOSIS — I428 Other cardiomyopathies: Secondary | ICD-10-CM | POA: Diagnosis not present

## 2016-11-27 DIAGNOSIS — J810 Acute pulmonary edema: Secondary | ICD-10-CM | POA: Diagnosis not present

## 2016-11-27 DIAGNOSIS — N390 Urinary tract infection, site not specified: Secondary | ICD-10-CM | POA: Diagnosis not present

## 2016-11-27 DIAGNOSIS — K668 Other specified disorders of peritoneum: Secondary | ICD-10-CM | POA: Diagnosis not present

## 2016-11-27 DIAGNOSIS — J9811 Atelectasis: Secondary | ICD-10-CM | POA: Diagnosis not present

## 2016-11-27 DIAGNOSIS — K760 Fatty (change of) liver, not elsewhere classified: Secondary | ICD-10-CM | POA: Diagnosis not present

## 2016-11-27 DIAGNOSIS — E871 Hypo-osmolality and hyponatremia: Secondary | ICD-10-CM | POA: Diagnosis not present

## 2016-11-27 DIAGNOSIS — I63441 Cerebral infarction due to embolism of right cerebellar artery: Secondary | ICD-10-CM | POA: Diagnosis not present

## 2016-11-27 DIAGNOSIS — I480 Paroxysmal atrial fibrillation: Secondary | ICD-10-CM | POA: Diagnosis not present

## 2016-11-27 DIAGNOSIS — Y848 Other medical procedures as the cause of abnormal reaction of the patient, or of later complication, without mention of misadventure at the time of the procedure: Secondary | ICD-10-CM | POA: Diagnosis not present

## 2016-11-27 DIAGNOSIS — L89620 Pressure ulcer of left heel, unstageable: Secondary | ICD-10-CM | POA: Diagnosis not present

## 2016-11-27 DIAGNOSIS — A419 Sepsis, unspecified organism: Secondary | ICD-10-CM | POA: Diagnosis not present

## 2016-11-27 DIAGNOSIS — T814XXD Infection following a procedure, subsequent encounter: Secondary | ICD-10-CM | POA: Diagnosis not present

## 2016-11-27 DIAGNOSIS — K279 Peptic ulcer, site unspecified, unspecified as acute or chronic, without hemorrhage or perforation: Secondary | ICD-10-CM | POA: Diagnosis not present

## 2016-11-27 DIAGNOSIS — E118 Type 2 diabetes mellitus with unspecified complications: Secondary | ICD-10-CM | POA: Diagnosis not present

## 2016-11-27 DIAGNOSIS — J9601 Acute respiratory failure with hypoxia: Secondary | ICD-10-CM | POA: Diagnosis not present

## 2016-11-27 DIAGNOSIS — K567 Ileus, unspecified: Secondary | ICD-10-CM | POA: Diagnosis not present

## 2016-11-27 DIAGNOSIS — I878 Other specified disorders of veins: Secondary | ICD-10-CM | POA: Diagnosis not present

## 2016-11-27 DIAGNOSIS — I5041 Acute combined systolic (congestive) and diastolic (congestive) heart failure: Secondary | ICD-10-CM | POA: Diagnosis not present

## 2016-11-27 DIAGNOSIS — I340 Nonrheumatic mitral (valve) insufficiency: Secondary | ICD-10-CM | POA: Diagnosis not present

## 2016-11-27 DIAGNOSIS — I5043 Acute on chronic combined systolic (congestive) and diastolic (congestive) heart failure: Secondary | ICD-10-CM | POA: Diagnosis not present

## 2016-11-27 DIAGNOSIS — I255 Ischemic cardiomyopathy: Secondary | ICD-10-CM | POA: Diagnosis not present

## 2016-11-27 DIAGNOSIS — E874 Mixed disorder of acid-base balance: Secondary | ICD-10-CM | POA: Diagnosis not present

## 2016-11-27 DIAGNOSIS — K9189 Other postprocedural complications and disorders of digestive system: Secondary | ICD-10-CM | POA: Diagnosis not present

## 2016-11-27 DIAGNOSIS — I472 Ventricular tachycardia: Secondary | ICD-10-CM | POA: Diagnosis not present

## 2016-11-27 DIAGNOSIS — G9340 Encephalopathy, unspecified: Secondary | ICD-10-CM | POA: Diagnosis not present

## 2016-11-27 DIAGNOSIS — K631 Perforation of intestine (nontraumatic): Secondary | ICD-10-CM | POA: Diagnosis present

## 2016-11-27 DIAGNOSIS — T17490A Other foreign object in trachea causing asphyxiation, initial encounter: Secondary | ICD-10-CM | POA: Diagnosis not present

## 2016-11-27 DIAGNOSIS — I749 Embolism and thrombosis of unspecified artery: Secondary | ICD-10-CM | POA: Diagnosis not present

## 2016-11-27 DIAGNOSIS — I9589 Other hypotension: Secondary | ICD-10-CM | POA: Diagnosis not present

## 2016-11-27 DIAGNOSIS — R509 Fever, unspecified: Secondary | ICD-10-CM | POA: Diagnosis not present

## 2016-11-27 DIAGNOSIS — R600 Localized edema: Secondary | ICD-10-CM | POA: Diagnosis not present

## 2016-11-27 HISTORY — PX: LAPAROTOMY: SHX154

## 2016-11-27 LAB — POCT I-STAT 7, (LYTES, BLD GAS, ICA,H+H)
ACID-BASE DEFICIT: 8 mmol/L — AB (ref 0.0–2.0)
BICARBONATE: 18.5 mmol/L — AB (ref 20.0–28.0)
CALCIUM ION: 1.28 mmol/L (ref 1.15–1.40)
HCT: 39 % (ref 39.0–52.0)
Hemoglobin: 13.3 g/dL (ref 13.0–17.0)
O2 Saturation: 99 %
PH ART: 7.288 — AB (ref 7.350–7.450)
PO2 ART: 153 mmHg — AB (ref 83.0–108.0)
Patient temperature: 97
Potassium: 3 mmol/L — ABNORMAL LOW (ref 3.5–5.1)
SODIUM: 137 mmol/L (ref 135–145)
TCO2: 20 mmol/L (ref 0–100)
pCO2 arterial: 38.4 mmHg (ref 32.0–48.0)

## 2016-11-27 LAB — BLOOD GAS, ARTERIAL
ACID-BASE DEFICIT: 7.4 mmol/L — AB (ref 0.0–2.0)
Bicarbonate: 19.9 mmol/L — ABNORMAL LOW (ref 20.0–28.0)
DRAWN BY: 11249
FIO2: 50
MECHVT: 550 mL
O2 SAT: 92.1 %
PATIENT TEMPERATURE: 97.6
PCO2 ART: 47.6 mmHg (ref 32.0–48.0)
PEEP/CPAP: 5 cmH2O
PH ART: 7.24 — AB (ref 7.350–7.450)
RATE: 16 resp/min
pO2, Arterial: 78 mmHg — ABNORMAL LOW (ref 83.0–108.0)

## 2016-11-27 LAB — GLUCOSE, CAPILLARY
GLUCOSE-CAPILLARY: 146 mg/dL — AB (ref 65–99)
GLUCOSE-CAPILLARY: 87 mg/dL (ref 65–99)
Glucose-Capillary: 77 mg/dL (ref 65–99)
Glucose-Capillary: 83 mg/dL (ref 65–99)
Glucose-Capillary: 92 mg/dL (ref 65–99)
Glucose-Capillary: 117 mg/dL — ABNORMAL HIGH (ref 65–99)
Glucose-Capillary: 61 mg/dL — ABNORMAL LOW (ref 65–99)

## 2016-11-27 LAB — MAGNESIUM
Magnesium: 1.3 mg/dL — ABNORMAL LOW (ref 1.7–2.4)
Magnesium: 2.6 mg/dL — ABNORMAL HIGH (ref 1.7–2.4)

## 2016-11-27 LAB — CBC WITH DIFFERENTIAL/PLATELET
Basophils Absolute: 0 10*3/uL (ref 0.0–0.1)
Basophils Relative: 0 %
EOS ABS: 0 10*3/uL (ref 0.0–0.7)
EOS PCT: 0 %
HCT: 37.7 % — ABNORMAL LOW (ref 39.0–52.0)
HEMOGLOBIN: 13.2 g/dL (ref 13.0–17.0)
LYMPHS ABS: 0.2 10*3/uL — AB (ref 0.7–4.0)
Lymphocytes Relative: 10 %
MCH: 33.8 pg (ref 26.0–34.0)
MCHC: 35 g/dL (ref 30.0–36.0)
MCV: 96.7 fL (ref 78.0–100.0)
MONO ABS: 0.1 10*3/uL (ref 0.1–1.0)
Monocytes Relative: 4 %
NEUTROS PCT: 86 %
Neutro Abs: 2 10*3/uL (ref 1.7–7.7)
PLATELETS: 161 10*3/uL (ref 150–400)
RBC: 3.9 MIL/uL — ABNORMAL LOW (ref 4.22–5.81)
RDW: 13.2 % (ref 11.5–15.5)
WBC: 2.4 10*3/uL — ABNORMAL LOW (ref 4.0–10.5)

## 2016-11-27 LAB — MRSA PCR SCREENING: MRSA by PCR: NEGATIVE

## 2016-11-27 LAB — TROPONIN I
TROPONIN I: 0.04 ng/mL — AB (ref <0.03)
TROPONIN I: 0.11 ng/mL — AB (ref <0.03)
TROPONIN I: 0.14 ng/mL — AB (ref <0.03)
Troponin I: 0.04 ng/mL (ref <0.03)

## 2016-11-27 LAB — COMPREHENSIVE METABOLIC PANEL
ALBUMIN: 2.8 g/dL — AB (ref 3.5–5.0)
ALBUMIN: 3 g/dL — AB (ref 3.5–5.0)
ALK PHOS: 38 U/L (ref 38–126)
ALT: 27 U/L (ref 17–63)
ALT: 37 U/L (ref 17–63)
ANION GAP: 13 (ref 5–15)
ANION GAP: 15 (ref 5–15)
AST: 62 U/L — ABNORMAL HIGH (ref 15–41)
AST: 80 U/L — AB (ref 15–41)
Alkaline Phosphatase: 36 U/L — ABNORMAL LOW (ref 38–126)
BILIRUBIN TOTAL: 2 mg/dL — AB (ref 0.3–1.2)
BILIRUBIN TOTAL: 2.6 mg/dL — AB (ref 0.3–1.2)
BUN: 42 mg/dL — ABNORMAL HIGH (ref 6–20)
BUN: 43 mg/dL — AB (ref 6–20)
CHLORIDE: 104 mmol/L (ref 101–111)
CO2: 19 mmol/L — ABNORMAL LOW (ref 22–32)
CO2: 19 mmol/L — ABNORMAL LOW (ref 22–32)
Calcium: 7.6 mg/dL — ABNORMAL LOW (ref 8.9–10.3)
Calcium: 7.9 mg/dL — ABNORMAL LOW (ref 8.9–10.3)
Chloride: 104 mmol/L (ref 101–111)
Creatinine, Ser: 1.95 mg/dL — ABNORMAL HIGH (ref 0.61–1.24)
Creatinine, Ser: 2.05 mg/dL — ABNORMAL HIGH (ref 0.61–1.24)
GFR calc Af Amer: 35 mL/min — ABNORMAL LOW (ref >60)
GFR calc non Af Amer: 32 mL/min — ABNORMAL LOW (ref >60)
GFR, EST AFRICAN AMERICAN: 38 mL/min — AB (ref >60)
GFR, EST NON AFRICAN AMERICAN: 30 mL/min — AB (ref >60)
GLUCOSE: 95 mg/dL (ref 65–99)
GLUCOSE: 104 mg/dL — AB (ref 65–99)
POTASSIUM: 2.5 mmol/L — AB (ref 3.5–5.1)
POTASSIUM: 2.7 mmol/L — AB (ref 3.5–5.1)
SODIUM: 136 mmol/L (ref 135–145)
Sodium: 138 mmol/L (ref 135–145)
TOTAL PROTEIN: 4.9 g/dL — AB (ref 6.5–8.1)
Total Protein: 5.2 g/dL — ABNORMAL LOW (ref 6.5–8.1)

## 2016-11-27 LAB — CBC
HEMATOCRIT: 41.5 % (ref 39.0–52.0)
Hemoglobin: 14.4 g/dL (ref 13.0–17.0)
MCH: 34 pg (ref 26.0–34.0)
MCHC: 34.7 g/dL (ref 30.0–36.0)
MCV: 97.9 fL (ref 78.0–100.0)
Platelets: 184 10*3/uL (ref 150–400)
RBC: 4.24 MIL/uL (ref 4.22–5.81)
RDW: 13.1 % (ref 11.5–15.5)
WBC: 3.7 10*3/uL — ABNORMAL LOW (ref 4.0–10.5)

## 2016-11-27 LAB — HEMOGLOBIN A1C
Hgb A1c MFr Bld: 5.8 % — ABNORMAL HIGH (ref 4.8–5.6)
Mean Plasma Glucose: 119.76 mg/dL

## 2016-11-27 LAB — PROCALCITONIN: Procalcitonin: 60.44 ng/mL

## 2016-11-27 LAB — PHOSPHORUS: Phosphorus: 4 mg/dL (ref 2.5–4.6)

## 2016-11-27 LAB — BASIC METABOLIC PANEL
Anion gap: 15 (ref 5–15)
BUN: 46 mg/dL — AB (ref 6–20)
CO2: 18 mmol/L — ABNORMAL LOW (ref 22–32)
Calcium: 7.5 mg/dL — ABNORMAL LOW (ref 8.9–10.3)
Chloride: 105 mmol/L (ref 101–111)
Creatinine, Ser: 2.46 mg/dL — ABNORMAL HIGH (ref 0.61–1.24)
GFR calc Af Amer: 28 mL/min — ABNORMAL LOW (ref >60)
GFR, EST NON AFRICAN AMERICAN: 24 mL/min — AB (ref >60)
GLUCOSE: 147 mg/dL — AB (ref 65–99)
POTASSIUM: 4.2 mmol/L (ref 3.5–5.1)
Sodium: 138 mmol/L (ref 135–145)

## 2016-11-27 LAB — LACTIC ACID, PLASMA
LACTIC ACID, VENOUS: 3 mmol/L — AB (ref 0.5–1.9)
Lactic Acid, Venous: 3.8 mmol/L (ref 0.5–1.9)
Lactic Acid, Venous: 7.5 mmol/L (ref 0.5–1.9)

## 2016-11-27 LAB — ABO/RH: ABO/RH(D): O POS

## 2016-11-27 LAB — LIPASE, BLOOD: Lipase: 59 U/L — ABNORMAL HIGH (ref 11–51)

## 2016-11-27 LAB — CORTISOL: CORTISOL PLASMA: 83.2 ug/dL

## 2016-11-27 LAB — PREALBUMIN: Prealbumin: 9.3 mg/dL — ABNORMAL LOW (ref 18–38)

## 2016-11-27 SURGERY — LAPAROTOMY, EXPLORATORY
Anesthesia: General | Site: Abdomen

## 2016-11-27 MED ORDER — LACTATED RINGERS IV SOLN: INTRAVENOUS | Status: DC

## 2016-11-27 MED ORDER — INSULIN ASPART 100 UNIT/ML ~~LOC~~ SOLN
0.0000 [IU] | SUBCUTANEOUS | Status: DC
  Administered 2016-11-28 – 2016-11-29 (×2): 3 [IU] via SUBCUTANEOUS
  Administered 2016-11-30: 4 [IU] via SUBCUTANEOUS
  Administered 2016-11-30: 3 [IU] via SUBCUTANEOUS
  Administered 2016-11-30: 4 [IU] via SUBCUTANEOUS
  Administered 2016-12-01 (×3): 3 [IU] via SUBCUTANEOUS
  Administered 2016-12-01 – 2016-12-02 (×2): 4 [IU] via SUBCUTANEOUS
  Administered 2016-12-02: 3 [IU] via SUBCUTANEOUS
  Administered 2016-12-02: 7 [IU] via SUBCUTANEOUS
  Administered 2016-12-02: 11 [IU] via SUBCUTANEOUS
  Administered 2016-12-02 (×2): 7 [IU] via SUBCUTANEOUS
  Administered 2016-12-03: 11 [IU] via SUBCUTANEOUS
  Administered 2016-12-03: 4 [IU] via SUBCUTANEOUS
  Administered 2016-12-03: 7 [IU] via SUBCUTANEOUS
  Administered 2016-12-03 (×2): 11 [IU] via SUBCUTANEOUS
  Administered 2016-12-04: 4 [IU] via SUBCUTANEOUS
  Administered 2016-12-04: 3 [IU] via SUBCUTANEOUS
  Administered 2016-12-04 – 2016-12-05 (×4): 4 [IU] via SUBCUTANEOUS
  Administered 2016-12-05 (×3): 7 [IU] via SUBCUTANEOUS
  Administered 2016-12-05: 4 [IU] via SUBCUTANEOUS
  Administered 2016-12-06 (×2): 3 [IU] via SUBCUTANEOUS
  Administered 2016-12-06 – 2016-12-07 (×9): 4 [IU] via SUBCUTANEOUS
  Administered 2016-12-08 – 2016-12-09 (×4): 3 [IU] via SUBCUTANEOUS
  Administered 2016-12-09: 4 [IU] via SUBCUTANEOUS
  Administered 2016-12-09 – 2016-12-11 (×4): 3 [IU] via SUBCUTANEOUS
  Administered 2016-12-11: 4 [IU] via SUBCUTANEOUS
  Administered 2016-12-11 – 2016-12-12 (×2): 3 [IU] via SUBCUTANEOUS
  Administered 2016-12-12: 4 [IU] via SUBCUTANEOUS
  Administered 2016-12-12 (×2): 3 [IU] via SUBCUTANEOUS
  Administered 2016-12-12: 4 [IU] via SUBCUTANEOUS
  Administered 2016-12-12: 11 [IU] via SUBCUTANEOUS
  Administered 2016-12-13 (×2): 4 [IU] via SUBCUTANEOUS
  Administered 2016-12-13 (×3): 3 [IU] via SUBCUTANEOUS
  Administered 2016-12-14 (×2): 4 [IU] via SUBCUTANEOUS
  Administered 2016-12-14: 3 [IU] via SUBCUTANEOUS
  Administered 2016-12-15 (×2): 4 [IU] via SUBCUTANEOUS
  Administered 2016-12-15: 3 [IU] via SUBCUTANEOUS
  Administered 2016-12-15 – 2016-12-16 (×3): 4 [IU] via SUBCUTANEOUS
  Administered 2016-12-16 (×2): 3 [IU] via SUBCUTANEOUS
  Administered 2016-12-16 (×2): 4 [IU] via SUBCUTANEOUS
  Administered 2016-12-16: 3 [IU] via SUBCUTANEOUS
  Administered 2016-12-17: 4 [IU] via SUBCUTANEOUS
  Administered 2016-12-17: 3 [IU] via SUBCUTANEOUS
  Administered 2016-12-17: 4 [IU] via SUBCUTANEOUS
  Administered 2016-12-17 – 2016-12-18 (×5): 3 [IU] via SUBCUTANEOUS
  Administered 2016-12-18: 4 [IU] via SUBCUTANEOUS
  Administered 2016-12-18 (×2): 3 [IU] via SUBCUTANEOUS
  Administered 2016-12-19: 4 [IU] via SUBCUTANEOUS
  Administered 2016-12-19: 3 [IU] via SUBCUTANEOUS
  Administered 2016-12-19: 7 [IU] via SUBCUTANEOUS
  Administered 2016-12-19: 3 [IU] via SUBCUTANEOUS
  Administered 2016-12-19 – 2016-12-20 (×6): 4 [IU] via SUBCUTANEOUS
  Administered 2016-12-21: 3 [IU] via SUBCUTANEOUS
  Administered 2016-12-21 (×2): 4 [IU] via SUBCUTANEOUS
  Administered 2016-12-22 (×2): 3 [IU] via SUBCUTANEOUS
  Administered 2016-12-23 (×2): 4 [IU] via SUBCUTANEOUS
  Administered 2016-12-23 – 2016-12-26 (×2): 3 [IU] via SUBCUTANEOUS

## 2016-11-27 MED ORDER — POTASSIUM CHLORIDE IN NACL 20-0.45 MEQ/L-% IV SOLN
INTRAVENOUS | Status: DC
  Filled 2016-11-27: qty 1000

## 2016-11-27 MED ORDER — MORPHINE SULFATE (PF) 2 MG/ML IV SOLN
1.0000 mg | INTRAVENOUS | Status: DC | PRN
  Administered 2016-11-28: 2 mg via INTRAVENOUS
  Filled 2016-11-27: qty 1

## 2016-11-27 MED ORDER — SODIUM CHLORIDE 0.9% FLUSH
10.0000 mL | Freq: Two times a day (BID) | INTRAVENOUS | Status: DC
  Administered 2016-11-27 – 2016-11-29 (×4): 10 mL
  Administered 2016-11-30: 20 mL
  Administered 2016-12-01 – 2016-12-24 (×37): 10 mL

## 2016-11-27 MED ORDER — DEXTROSE 50 % IV SOLN
INTRAVENOUS | Status: AC
  Administered 2016-11-27: 25 mL via INTRAVENOUS
  Filled 2016-11-27: qty 50

## 2016-11-27 MED ORDER — FENTANYL CITRATE (PF) 100 MCG/2ML IJ SOLN: 50.0000 ug | Freq: Once | INTRAMUSCULAR | Status: DC

## 2016-11-27 MED ORDER — SODIUM BICARBONATE 8.4 % IV SOLN
INTRAVENOUS | Status: DC
  Administered 2016-11-27 – 2016-11-28 (×3): via INTRAVENOUS
  Filled 2016-11-27 (×3): qty 150

## 2016-11-27 MED ORDER — ONDANSETRON HCL 4 MG/2ML IJ SOLN: 4.0000 mg | Freq: Four times a day (QID) | INTRAMUSCULAR | Status: DC | PRN

## 2016-11-27 MED ORDER — IOPAMIDOL (ISOVUE-370) INJECTION 76%
100.0000 mL | Freq: Once | INTRAVENOUS | Status: AC | PRN
Start: 2016-11-27 — End: 2016-11-27
  Administered 2016-11-27: 80 mL via INTRAVENOUS

## 2016-11-27 MED ORDER — NOREPINEPHRINE BITARTRATE 1 MG/ML IV SOLN
0.0000 ug/min | Freq: Once | INTRAVENOUS | Status: AC
  Administered 2016-11-27: 5 ug/min via INTRAVENOUS
  Filled 2016-11-27: qty 4

## 2016-11-27 MED ORDER — ROCURONIUM BROMIDE 10 MG/ML (PF) SYRINGE
PREFILLED_SYRINGE | INTRAVENOUS | Status: DC | PRN
  Administered 2016-11-27: 30 mg via INTRAVENOUS
  Administered 2016-11-27: 40 mg via INTRAVENOUS
  Administered 2016-11-27: 30 mg via INTRAVENOUS

## 2016-11-27 MED ORDER — ROCURONIUM BROMIDE 50 MG/5ML IV SOLN
100.0000 mg | Freq: Once | INTRAVENOUS | Status: AC
  Administered 2016-11-27: 100 mg via INTRAVENOUS
  Filled 2016-11-27: qty 10

## 2016-11-27 MED ORDER — PANTOPRAZOLE SODIUM 40 MG IV SOLR: 40.0000 mg | Freq: Two times a day (BID) | INTRAVENOUS | Status: DC

## 2016-11-27 MED ORDER — ETOMIDATE 2 MG/ML IV SOLN
30.0000 mg | Freq: Once | INTRAVENOUS | Status: AC
  Administered 2016-11-27: 30 mg via INTRAVENOUS

## 2016-11-27 MED ORDER — FENTANYL CITRATE (PF) 100 MCG/2ML IJ SOLN
INTRAMUSCULAR | Status: DC | PRN
  Administered 2016-11-27: 50 ug via INTRAVENOUS

## 2016-11-27 MED ORDER — SODIUM CHLORIDE 0.9 % IR SOLN
Status: DC | PRN
  Administered 2016-11-27: 21000 mL

## 2016-11-27 MED ORDER — SODIUM CHLORIDE 0.9 % IV SOLN: 250.0000 mL | INTRAVENOUS | Status: DC | PRN

## 2016-11-27 MED ORDER — SODIUM BICARBONATE 8.4 % IV SOLN
INTRAVENOUS | Status: DC | PRN
Start: 2016-11-27 — End: 2016-11-27
  Administered 2016-11-27: 100 meq via INTRAVENOUS

## 2016-11-27 MED ORDER — FLUCONAZOLE IN SODIUM CHLORIDE 200-0.9 MG/100ML-% IV SOLN
200.0000 mg | INTRAVENOUS | Status: DC
  Administered 2016-11-28 – 2016-12-02 (×5): 200 mg via INTRAVENOUS
  Filled 2016-11-27 (×5): qty 100

## 2016-11-27 MED ORDER — SODIUM CHLORIDE 0.9 % IV SOLN
INTRAVENOUS | Status: DC
  Administered 2016-11-27: 06:00:00 via INTRAVENOUS

## 2016-11-27 MED ORDER — FENTANYL BOLUS VIA INFUSION
25.0000 ug | INTRAVENOUS | Status: DC | PRN
  Filled 2016-11-27: qty 25

## 2016-11-27 MED ORDER — HEPARIN SODIUM (PORCINE) 5000 UNIT/ML IJ SOLN: 5000.0000 [IU] | Freq: Three times a day (TID) | INTRAMUSCULAR | Status: DC

## 2016-11-27 MED ORDER — MAGNESIUM SULFATE 4 GM/100ML IV SOLN
4.0000 g | Freq: Once | INTRAVENOUS | Status: AC
  Administered 2016-11-27: 4 g via INTRAVENOUS
  Filled 2016-11-27: qty 100

## 2016-11-27 MED ORDER — FENTANYL CITRATE (PF) 250 MCG/5ML IJ SOLN
INTRAMUSCULAR | Status: AC
  Filled 2016-11-27: qty 5

## 2016-11-27 MED ORDER — FLUCONAZOLE IN SODIUM CHLORIDE 400-0.9 MG/200ML-% IV SOLN
400.0000 mg | Freq: Once | INTRAVENOUS | Status: AC
  Administered 2016-11-27: 400 mg via INTRAVENOUS
  Filled 2016-11-27: qty 200

## 2016-11-27 MED ORDER — PROPOFOL 1000 MG/100ML IV EMUL
5.0000 ug/kg/min | Freq: Once | INTRAVENOUS | Status: DC
  Administered 2016-11-27: 10 ug/kg/min via INTRAVENOUS

## 2016-11-27 MED ORDER — CHLORHEXIDINE GLUCONATE CLOTH 2 % EX PADS
6.0000 | MEDICATED_PAD | Freq: Every day | CUTANEOUS | Status: DC
  Administered 2016-11-28 – 2016-12-24 (×18): 6 via TOPICAL

## 2016-11-27 MED ORDER — PHENYLEPHRINE 40 MCG/ML (10ML) SYRINGE FOR IV PUSH (FOR BLOOD PRESSURE SUPPORT)
PREFILLED_SYRINGE | INTRAVENOUS | Status: AC
  Administered 2016-11-27: 400 ug via INTRAVENOUS
  Filled 2016-11-27: qty 10

## 2016-11-27 MED ORDER — POTASSIUM CHLORIDE 10 MEQ/50ML IV SOLN
10.0000 meq | INTRAVENOUS | Status: AC
  Administered 2016-11-27 (×6): 10 meq via INTRAVENOUS
  Filled 2016-11-27 (×9): qty 50

## 2016-11-27 MED ORDER — PIPERACILLIN-TAZOBACTAM 3.375 G IVPB 30 MIN
3.3750 g | Freq: Once | INTRAVENOUS | Status: AC
  Administered 2016-11-27: 3.375 g via INTRAVENOUS
  Filled 2016-11-27: qty 50

## 2016-11-27 MED ORDER — PHENYLEPHRINE 40 MCG/ML (10ML) SYRINGE FOR IV PUSH (FOR BLOOD PRESSURE SUPPORT)
400.0000 ug | PREFILLED_SYRINGE | Freq: Once | INTRAVENOUS | Status: AC
  Administered 2016-11-27: 400 ug via INTRAVENOUS

## 2016-11-27 MED ORDER — PIPERACILLIN-TAZOBACTAM 3.375 G IVPB 30 MIN: 3.3750 g | Freq: Once | INTRAVENOUS | Status: DC

## 2016-11-27 MED ORDER — SODIUM BICARBONATE 8.4 % IV SOLN
INTRAVENOUS | Status: AC
  Filled 2016-11-27: qty 100

## 2016-11-27 MED ORDER — VANCOMYCIN HCL IN DEXTROSE 1-5 GM/200ML-% IV SOLN
1000.0000 mg | Freq: Once | INTRAVENOUS | Status: AC
  Administered 2016-11-27: 1000 mg via INTRAVENOUS
  Filled 2016-11-27: qty 200

## 2016-11-27 MED ORDER — ENOXAPARIN SODIUM 30 MG/0.3ML ~~LOC~~ SOLN: 30.0000 mg | SUBCUTANEOUS | Status: DC

## 2016-11-27 MED ORDER — SODIUM CHLORIDE 0.9% FLUSH: 10.0000 mL | INTRAVENOUS | Status: DC | PRN

## 2016-11-27 MED ORDER — LACTATED RINGERS IV SOLN
INTRAVENOUS | Status: DC | PRN
  Administered 2016-11-27: 03:00:00 via INTRAVENOUS

## 2016-11-27 MED ORDER — PANTOPRAZOLE SODIUM 40 MG IV SOLR: 40.0000 mg | Freq: Every day | INTRAVENOUS | Status: DC

## 2016-11-27 MED ORDER — CALCIUM CHLORIDE 10 % IV SOLN
INTRAVENOUS | Status: DC | PRN
  Administered 2016-11-27 (×2): 200 mg via INTRAVENOUS
  Administered 2016-11-27: 300 mg via INTRAVENOUS

## 2016-11-27 MED ORDER — EPINEPHRINE PF 1 MG/ML IJ SOLN
0.5000 ug/min | INTRAVENOUS | Status: DC
  Administered 2016-11-27: 1 ug/min via INTRAVENOUS
  Filled 2016-11-27 (×2): qty 4

## 2016-11-27 MED ORDER — CHLORHEXIDINE GLUCONATE CLOTH 2 % EX PADS
6.0000 | MEDICATED_PAD | Freq: Every day | CUTANEOUS | Status: DC
  Administered 2016-11-27: 6 via TOPICAL

## 2016-11-27 MED ORDER — ASPIRIN 81 MG PO CHEW
81.0000 mg | CHEWABLE_TABLET | Freq: Every day | ORAL | Status: DC
  Administered 2016-11-28 – 2017-01-02 (×33): 81 mg
  Filled 2016-11-27 (×35): qty 1

## 2016-11-27 MED ORDER — ONDANSETRON 4 MG PO TBDP
4.0000 mg | ORAL_TABLET | Freq: Four times a day (QID) | ORAL | Status: DC | PRN
  Filled 2016-11-27: qty 1

## 2016-11-27 MED ORDER — PIPERACILLIN-TAZOBACTAM 3.375 G IVPB
3.3750 g | Freq: Three times a day (TID) | INTRAVENOUS | Status: DC
  Administered 2016-11-27 – 2016-12-04 (×21): 3.375 g via INTRAVENOUS
  Filled 2016-11-27 (×24): qty 50

## 2016-11-27 MED ORDER — ALBUMIN HUMAN 5 % IV SOLN
INTRAVENOUS | Status: DC | PRN
  Administered 2016-11-27 (×2): via INTRAVENOUS

## 2016-11-27 MED ORDER — POTASSIUM CHLORIDE 20 MEQ/15ML (10%) PO SOLN: 40.0000 meq | Freq: Once | ORAL | Status: DC

## 2016-11-27 MED ORDER — ORAL CARE MOUTH RINSE
15.0000 mL | Freq: Four times a day (QID) | OROMUCOSAL | Status: DC
  Administered 2016-11-27 – 2016-12-08 (×40): 15 mL via OROMUCOSAL

## 2016-11-27 MED ORDER — PROPOFOL 1000 MG/100ML IV EMUL: 5.0000 ug/kg/min | Freq: Once | INTRAVENOUS | Status: DC

## 2016-11-27 MED ORDER — IOPAMIDOL (ISOVUE-370) INJECTION 76%
INTRAVENOUS | Status: AC
  Administered 2016-11-27: 80 mL via INTRAVENOUS
  Filled 2016-11-27: qty 100

## 2016-11-27 MED ORDER — DEXTROSE 50 % IV SOLN
25.0000 mL | Freq: Once | INTRAVENOUS | Status: AC
  Administered 2016-11-27: 25 mL via INTRAVENOUS

## 2016-11-27 MED ORDER — ASPIRIN 81 MG PO CHEW: 81.0000 mg | CHEWABLE_TABLET | Freq: Every day | ORAL | Status: DC

## 2016-11-27 MED ORDER — NOREPINEPHRINE BITARTRATE 1 MG/ML IV SOLN
0.0000 ug/min | INTRAVENOUS | Status: DC
  Administered 2016-11-27: 17 ug/min via INTRAVENOUS
  Administered 2016-11-27: 14 ug/min via INTRAVENOUS
  Administered 2016-11-27: 17 ug/min via INTRAVENOUS
  Administered 2016-11-28: 15 ug/min via INTRAVENOUS
  Administered 2016-11-28: 19 ug/min via INTRAVENOUS
  Filled 2016-11-27 (×8): qty 4

## 2016-11-27 MED ORDER — MIDAZOLAM HCL 2 MG/2ML IJ SOLN: 1.0000 mg | INTRAMUSCULAR | Status: DC | PRN

## 2016-11-27 MED ORDER — SODIUM CHLORIDE 0.9 % IV SOLN
25.0000 ug/h | INTRAVENOUS | Status: DC
  Filled 2016-11-27: qty 50

## 2016-11-27 MED ORDER — NOREPINEPHRINE BITARTRATE 1 MG/ML IV SOLN
INTRAVENOUS | Status: DC | PRN
  Administered 2016-11-27: 18.8 ug/min via INTRAVENOUS

## 2016-11-27 MED ORDER — PANTOPRAZOLE SODIUM 40 MG IV SOLR
40.0000 mg | Freq: Two times a day (BID) | INTRAVENOUS | Status: DC
  Administered 2016-11-27 – 2016-12-12 (×30): 40 mg via INTRAVENOUS
  Filled 2016-11-27 (×31): qty 40

## 2016-11-27 MED ORDER — LACTATED RINGERS IV SOLN
INTRAVENOUS | Status: DC | PRN
  Administered 2016-11-27: 04:00:00 via INTRAVENOUS

## 2016-11-27 MED ORDER — PROPOFOL 1000 MG/100ML IV EMUL
INTRAVENOUS | Status: AC
  Administered 2016-11-27: 10 ug/kg/min via INTRAVENOUS
  Filled 2016-11-27: qty 100

## 2016-11-27 MED ORDER — CHLORHEXIDINE GLUCONATE 0.12% ORAL RINSE (MEDLINE KIT)
15.0000 mL | Freq: Two times a day (BID) | OROMUCOSAL | Status: DC
  Administered 2016-11-27 – 2016-12-07 (×21): 15 mL via OROMUCOSAL

## 2016-11-27 MED ORDER — ARTIFICIAL TEARS OPHTHALMIC OINT
TOPICAL_OINTMENT | OPHTHALMIC | Status: DC | PRN
  Administered 2016-11-27: 10:00:00 via OPHTHALMIC
  Filled 2016-11-27: qty 3.5

## 2016-11-27 SURGICAL SUPPLY — 44 items
APPLICATOR COTTON TIP 6IN STRL (MISCELLANEOUS) IMPLANT
BLADE EXTENDED COATED 6.5IN (ELECTRODE) ×3 IMPLANT
BLADE HEX COATED 2.75 (ELECTRODE) ×3 IMPLANT
COVER MAYO STAND STRL (DRAPES) ×3 IMPLANT
COVER SURGICAL LIGHT HANDLE (MISCELLANEOUS) ×3 IMPLANT
DRAIN CHANNEL 19F RND (DRAIN) ×3 IMPLANT
DRAPE LAPAROSCOPIC ABDOMINAL (DRAPES) ×3 IMPLANT
DRAPE WARM FLUID 44X44 (DRAPE) ×3 IMPLANT
ELECT REM PT RETURN 15FT ADLT (MISCELLANEOUS) ×3 IMPLANT
EVACUATOR SILICONE 100CC (DRAIN) ×3 IMPLANT
GAUZE SPONGE 4X4 12PLY STRL (GAUZE/BANDAGES/DRESSINGS) ×3 IMPLANT
GLOVE BIOGEL PI IND STRL 7.0 (GLOVE) ×1 IMPLANT
GLOVE BIOGEL PI INDICATOR 7.0 (GLOVE) ×2
GLOVE SURG SIGNA 7.5 PF LTX (GLOVE) ×3 IMPLANT
GOWN SPEC L4 XLG W/TWL (GOWN DISPOSABLE) ×3 IMPLANT
GOWN STRL REUS W/ TWL XL LVL3 (GOWN DISPOSABLE) ×3 IMPLANT
GOWN STRL REUS W/TWL LRG LVL3 (GOWN DISPOSABLE) ×3 IMPLANT
GOWN STRL REUS W/TWL XL LVL3 (GOWN DISPOSABLE) ×6
HANDLE SUCTION POOLE (INSTRUMENTS) ×1 IMPLANT
KIT BASIN OR (CUSTOM PROCEDURE TRAY) ×3 IMPLANT
NS IRRIG 1000ML POUR BTL (IV SOLUTION) ×6 IMPLANT
PACK GENERAL/GYN (CUSTOM PROCEDURE TRAY) ×3 IMPLANT
PAD ABD 8X10 STRL (GAUZE/BANDAGES/DRESSINGS) ×3 IMPLANT
SPONGE LAP 18X18 X RAY DECT (DISPOSABLE) ×6 IMPLANT
STAPLER VISISTAT 35W (STAPLE) IMPLANT
SUCTION POOLE HANDLE (INSTRUMENTS) ×3
SUT ETHILON 2 0 PS N (SUTURE) ×3 IMPLANT
SUT NOVA T20/GS 25 (SUTURE) ×6 IMPLANT
SUT PDS AB 1 CTX 36 (SUTURE) ×6 IMPLANT
SUT SILK 2 0 (SUTURE) ×2
SUT SILK 2 0 SH CR/8 (SUTURE) ×3 IMPLANT
SUT SILK 2 0SH CR/8 30 (SUTURE) ×3 IMPLANT
SUT SILK 2-0 18XBRD TIE 12 (SUTURE) ×1 IMPLANT
SUT SILK 3 0 (SUTURE) ×2
SUT SILK 3 0 SH CR/8 (SUTURE) ×3 IMPLANT
SUT SILK 3-0 18XBRD TIE 12 (SUTURE) ×1 IMPLANT
SUT VIC AB 2-0 SH 18 (SUTURE) ×3 IMPLANT
SUT VICRYL 2 0 18  UND BR (SUTURE)
SUT VICRYL 2 0 18 UND BR (SUTURE) IMPLANT
TAPE CLOTH SURG 4X10 WHT LF (GAUZE/BANDAGES/DRESSINGS) ×3 IMPLANT
TOWEL OR 17X26 10 PK STRL BLUE (TOWEL DISPOSABLE) ×6 IMPLANT
TRAY FOLEY W/METER SILVER 16FR (SET/KITS/TRAYS/PACK) IMPLANT
WATER STERILE IRR 1500ML POUR (IV SOLUTION) ×3 IMPLANT
YANKAUER SUCT BULB TIP NO VENT (SUCTIONS) IMPLANT

## 2016-11-27 NOTE — Anesthesia Preprocedure Evaluation (Addendum)
Anesthesia Evaluation  Patient identified by MRN, date of birth, ID band Patient unresponsive  Preop documentation limited or incomplete due to emergent nature of procedure.  Airway Mallampati: Intubated      Comment: Intubated from ED Dental   Pulmonary shortness of breath, former smoker,     + decreased breath sounds      Cardiovascular hypertension, + Peripheral Vascular Disease and +CHF   Rhythm:Irregular Rate:Tachycardia     Neuro/Psych    GI/Hepatic GI history noted. CG   Endo/Other  diabetes  Renal/GU      Musculoskeletal   Abdominal   Peds  Hematology   Anesthesia Other Findings   Reproductive/Obstetrics                            Anesthesia Physical Anesthesia Plan  ASA: IV and emergent  Anesthesia Plan: General   Post-op Pain Management:    Induction: Intravenous  PONV Risk Score and Plan: 2 and Ondansetron, Dexamethasone and Treatment may vary due to age or medical condition  Airway Management Planned: Oral ETT  Additional Equipment:   Intra-op Plan:   Post-operative Plan: Post-operative intubation/ventilation  Informed Consent:   Plan Discussed with: CRNA, Anesthesiologist and Surgeon  Anesthesia Plan Comments:         Anesthesia Quick Evaluation

## 2016-11-27 NOTE — Progress Notes (Signed)
Day of Surgery    CC:  Abdominal pain, hiccups, no BM for 2 weeks  Subjective: Sedated on the vent.  Not responsive.  Pressors are being weaned.  No sedation for a couple hours and his is not even blinking.  About 50 ml of urine over the last couple hours.    Objective: Vital signs in last 24 hours: Temp:  [97.2 F (36.2 C)-97.5 F (36.4 C)] 97.2 F (36.2 C) (08/10 0756) Pulse Rate:  [32-108] 91 (08/10 0715) Resp:  [12-43] 22 (08/10 0715) BP: (74-145)/(39-96) 92/66 (08/10 0630) SpO2:  [88 %-100 %] 94 % (08/10 0837) FiO2 (%):  [50 %-100 %] 50 % (08/10 0837) Weight:  [90.7 kg (200 lb)] 90.7 kg (200 lb) (08/10 0218)   6267 IV 200 urine Drain 50 Blood loss < 100 Temp in 97 range, BP borderline low, on Vent, mile tachycardia PH 7.24 this AM on VENT K+2.7  Creatinine up to 2.05 Bilirubin up to 2.6 Lipase 59 last PM Trop 0.04 Lactate 3.8 CXR with ? Edema and pleural effusions Intake/Output from previous day: 08/09 0701 - 08/10 0700 In: 6267 [I.V.:2417; IV Piggyback:3850] Out: 350 [Urine:200; Drains:50; Blood:100] Intake/Output this shift: No intake/output data recorded.  General appearance:  sedated and non responsive Resp: Full vent support GI: large abdomen, distended, Dressing is dry, no BS.  Drainage from the JP is clear serous fluid Extremities: no edema  Lab Results:   Recent Labs  11/27/16 0530 11/27/16 0740  WBC 2.4* 3.7*  HGB 13.2 14.4  HCT 37.7* 41.5  PLT 161 184    BMET  Recent Labs  11/27/16 0530 11/27/16 0740  NA 136 138  K 2.5* 2.7*  CL 104 104  CO2 19* 19*  GLUCOSE 95 104*  BUN 42* 43*  CREATININE 1.95* 2.05*  CALCIUM 7.6* 7.9*   PT/INR  Recent Labs  11/26/16 2315  LABPROT 15.6*  INR 1.23     Recent Labs Lab 11/26/16 1553 11/27/16 0530 11/27/16 0740  AST 36 62* 80*  ALT 20 27 37  ALKPHOS 86 36* 38  BILITOT 3.0* 2.0* 2.6*  PROT 7.9 4.9* 5.2*  ALBUMIN 4.2 2.8* 3.0*     Lipase     Component Value Date/Time   LIPASE  59 (H) 11/27/2016 0530     Medications: . [START ON 11/28/2016] enoxaparin (LOVENOX) injection  30 mg Subcutaneous Q24H  . fentaNYL (SUBLIMAZE) injection  50 mcg Intravenous Once  . insulin aspart  0-20 Units Subcutaneous Q4H  . pantoprazole (PROTONIX) IV  40 mg Intravenous Q12H   . sodium chloride    . fentaNYL infusion INTRAVENOUS Stopped (11/27/16 0752)  . [START ON 11/28/2016] fluconazole (DIFLUCAN) IV    . norepinephrine (LEVOPHED) Adult infusion 6 mcg/min (11/27/16 0841)  . piperacillin-tazobactam (ZOSYN)  IV 3.375 g (11/27/16 0808)  . potassium chloride 10 mEq (11/27/16 0824)  .  sodium bicarbonate  infusion 1000 mL 100 mL/hr at 11/27/16 1610   Anti-infectives    Start     Dose/Rate Route Frequency Ordered Stop   11/28/16 0800  fluconazole (DIFLUCAN) IVPB 200 mg     200 mg 100 mL/hr over 60 Minutes Intravenous Every 24 hours 11/27/16 0624     11/27/16 0630  fluconazole (DIFLUCAN) IVPB 400 mg     400 mg 100 mL/hr over 120 Minutes Intravenous  Once 11/27/16 0622 11/27/16 0841   11/27/16 0600  piperacillin-tazobactam (ZOSYN) IVPB 3.375 g     3.375 g 12.5 mL/hr over 240 Minutes Intravenous Every  8 hours 11/27/16 0534     11/27/16 0515  piperacillin-tazobactam (ZOSYN) IVPB 3.375 g  Status:  Discontinued     3.375 g 100 mL/hr over 30 Minutes Intravenous  Once 11/27/16 0510 11/27/16 0518   11/27/16 0030  piperacillin-tazobactam (ZOSYN) IVPB 3.375 g     3.375 g 100 mL/hr over 30 Minutes Intravenous  Once 11/27/16 0016 11/27/16 0755   11/27/16 0030  vancomycin (VANCOCIN) IVPB 1000 mg/200 mL premix     1,000 mg 200 mL/hr over 60 Minutes Intravenous  Once 11/27/16 0016 11/27/16 0756      Assessment/Plan Perforated pyloric channel ulcer, 2,400 of gastric contents in abdominal cavity S/p EXPLORATORY LAPAROTOMY, Oversew pyloric channel ulcer, graham omental patch repair perforated ulcer, 11/26/16, Dr. Ovidio Kin Hx of CM with EF of 15%, Mild AR, Mild MR AICD Pulmonary vascular  congestion/bilateral small pleural effusions/? edema Hypertension Type II diabetes PVD Osteoarthritis right knee ? Hx of ETOH use FEN:  IV fluids/ bicarbonate drip/NPO ID:  Vancomycin x 1 dose.  Zosyn 11/27/16 =>>day 2  Diflucan 11/27/16 =>> day 2 DVT:  Lovenox to start 11/28/16 0800/SCD   Plan:  Over the next few days resuscitation by CCM with be the primary Rx.  There is some question of ETOH use, so we will need to watch that after he gets extubated.  From our standpoint we can start dressing changes this PM.  Once he is extubated and he gets some GI function back he will most likely need an UGI to be sure the perforation is sealed.  Will check H Pylor, and prealbumin.  Malnutrition may also be and issue.      LOS: 0 days    Zivah Mayr 11/27/2016 [phone]

## 2016-11-27 NOTE — Progress Notes (Signed)
Initial Nutrition Assessment  DOCUMENTATION CODES:   Obesity unspecified  INTERVENTION:  - RD will continue to monitor for nutrition-related plan and provide interventions as warranted.  NUTRITION DIAGNOSIS:   Inadequate oral intake related to inability to eat as evidenced by NPO status.  GOAL:   Provide needs based on ASPEN/SCCM guidelines  MONITOR:   Vent status, Weight trends, Labs, Skin, I & O's  REASON FOR ASSESSMENT:   Ventilator, Low Braden  ASSESSMENT:   71 y.o.M with CHF (EF 15%), HTN, DM, PAD, and OA, who presented [facility] 8[facility]9 with worsening lower abd pain over 3 days, belching, vomiting x 3 days, and constipation (no BM x 2 weeks). In the ER he had a lactate of 9.4 and was given 4L IVF per sepsis protocol. He then became hypoxic with rhonchi and increased work of breathing. CT revealed pneumoperitoneum with moderate amount of free fluid due to a bowel perforation.   BMI indicates obesity. Spoke with daughter, who was at bedside. She reports that pt usually has a very good appetite and is able to eat favorite foods (gave the examples of ham hock and pinto beans) without issue. She states that 2 days PTA (although H&P, as outlined above, states 3 days) pt began experiencing vomiting with all PO intakes, including water. Daughter is unsure if pt felt nauseated prior to episodes of emesis as vomiting happened nearly instantaneously and pt was having ongoing hiccupping.   Pt was intubated at ~0130 in the ED. He subsequently underwent ex lap with oversewing of pyloric channel ulcer, repair of perforated ulcer. NGT placed to suction. NGT in place at this time with no drainage present in canister; no documented output from overnight.  Physical assessment shows no muscle or fat wasting, abdomen distended and very firm and rigid, mild generalized edema. Per chart review, many weight fluctuations over the past nearly 2 years; will monitor weight trends closely during  hospitalization.  Patient is currently intubated on ventilator support MV: 13.6 L/min Temp (24hrs), Avg:97.5 F (36.4 C), Min:97.2 F (36.2 C), Max:98.2 F (36.8 C) Propofol: none BP: 76/44 and MAP: 36  Medications reviewed; sliding scale Novolog, 4 g IV Mg sulfate x1 run today, 40 mg IV Protonix BID, 10 mEq IV KCl x3 runs yesterday Labs reviewed; CBG: 83 mg/dL today, K: 2.7 mmol/L, BUN: 43 mg/dL, creatinine: 1.77 mg/dL, Ca: 7.9 mg/dL, AST elevated and trending up, GFR: 35 mL/min.  IVF: D5-150 mEq sodium bicarb @ 100 mL/hr (408 kcal from dextrose). Drip: Levo 2 14 mcg/min.   Diet Order:  Diet NPO time specified  Skin:  Wound (see comment) (Abdominal incision from 8/10)  Last BM:  PTA/unknown  Height:   Ht Readings from Last 1 Encounters:  11/27/16 5\' 8"  (1.727 m)    Weight:   Wt Readings from Last 1 Encounters:  11/27/16 212 lb 8.4 oz (96.4 kg)    Ideal Body Weight:  70 kg  BMI:  Body mass index is 32.31 kg/m.  Estimated Nutritional Needs:   Kcal:  1000-1270 (11-14 kcal/kg)  Protein:  140 grams (2 grams/kg IBW)  Fluid:  >/= 1.5 L/day  EDUCATION NEEDS:   No education needs identified at this time    Trenton Gammon, MS, RD, LDN, CNSC Inpatient Clinical Dietitian Pager # [phone] After hours/weekend pager # [phone]

## 2016-11-27 NOTE — Transfer of Care (Signed)
Immediate Anesthesia Transfer of Care Note  Patient: Jacob Pittman  Procedure(s) Performed: Procedure(s): EXPLORATORY LAPAROTOMY abdominal exploration oversew pyloric channel ulcer gram patch repair perforated ulcer (N/A)  Patient Location: ICU  Anesthesia Type:General  Level of Consciousness: unresponsive  Airway & Oxygen Therapy: Patient placed on Ventilator (see vital sign flow sheet for setting)  Post-op Assessment: Report given to RN and Post -op Vital signs reviewed and stable  Post vital signs: Reviewed and stable  Last Vitals:  Vitals:   11/27/16 0211 11/27/16 0221  BP: 123/73   Pulse:    Resp: 14   Temp:    SpO2: 93% 92%    Last Pain:  Vitals:   11/27/16 0137  TempSrc:   PainSc: 0-No pain         Complications: No apparent anesthesia complications

## 2016-11-27 NOTE — Progress Notes (Signed)
eLink Physician-Brief Progress Note Patient Name: Jacob Pittman DOB: 12-01-1945 MRN: [id number]   Date of Service  11/27/2016  HPI/Events of Note  Troponin = 0.04 --> 0.04 -->  0.11. EKG from this AM - AFIB with HR = 75. PVC, RBBB and LAFB. Finding c/w NSTEMI. On a Norepinephrine IV infusion, therefore, can't use B-Blocker. Recent surgery for gastric ulcer perforation, therefore, reluctant to use Heparin IV infusion.  eICU Interventions  Will order: 1. ASA 81 mg per tube now and Q day.  2. Cardiac Echo in AM. 3. Continue to cycle Troponin. 4 . If Troponin continues to rise, will need Heparin IV infusion and Cardiology consultation.       Intervention Category Intermediate Interventions: Diagnostic test evaluation  Sommer,Steven Dennard Nip 11/27/2016, 7:57 PM

## 2016-11-27 NOTE — Anesthesia Procedure Notes (Signed)
Central Venous Catheter Insertion Performed by: Holbert Caples Start/End8/01/2017 2:55 AM, 11/27/2016 3:05 AM Patient location: OR. Preanesthetic checklist: patient identified, IV checked, site marked, risks and benefits discussed, surgical consent, monitors and equipment checked, pre-op evaluation and timeout performed Position: Trendelenburg Hand hygiene performed , maximum sterile barriers used  and Seldinger technique used Double lumen Procedure performed using ultrasound guided technique. Ultrasound Notes:anatomy identified and needle tip was noted to be adjacent to the nerve/plexus identified Attempts: 1 Following insertion, line sutured. Post procedure assessment: free fluid flow  Patient tolerated the procedure well with no immediate complications.

## 2016-11-27 NOTE — H&P (Signed)
PULMONARY / CRITICAL CARE MEDICINE   Name: Jacob Pittman MRN: [id number] DOB: 1945/09/12    ADMISSION DATE:  11/26/2016 CONSULTATION DATE:  11/26/16  REFERRING MD:  Lucia Gaskins  CHIEF COMPLAINT:  Abd pain  HISTORY OF PRESENT ILLNESS:   Pt is encephelopathic; therefore, this HPI is obtained from chart review. Jacob Pittman is 71 y.o.M with CHF (EF 15%), HTN, DM, PAD, and OA, who presented to [facility] 8/9 with worsening lower abd pain over 3 days, belching, vomiting x 3 days, and constipation (no BM x 2 weeks). In the ER he had a lactate of 9.4 and was given 4L IVF per sepsis protocol. He then became hypoxic with rhonchi and increased work of breathing. CT revealed pneumoperitoneum with moderate amount of free fluid due to a bowel perforation.   He was taken to the OR where he was found to have a perforated pyloric channel ulcer along with 2,440m of gastric contents within the abdominal cavity.  This was repaired and he was then transferred to the ICU where PCCM was asked to take over his care.  In ED, he received 4L IVF along with vanc + zosyn.  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  PAST SURGICAL HISTORY: He  has a past surgical history that includes s/p left broken arm with pin; Cardiac defibrillator placement (Left, 04/10/13); and implantable cardioverter defibrillator implant (N/A, 04/10/2013).  No Known Allergies  No current facility-administered medications on file prior to encounter.    Current Outpatient Prescriptions on File Prior to Encounter  Medication Sig  . aspirin 81 MG EC  tablet Take 81 mg by mouth daily.    . carvedilol (COREG) 12.5 MG tablet TAKE ONE TABLET BY MOUTH TWICE DAILY  . fluticasone (FLONASE) 50 MCG/ACT nasal spray Place 2 sprays into both nostrils daily.  . metFORMIN (GLUCOPHAGE-XR) 500 MG 24 hr tablet TAKE TWO TABLETS BY MOUTH ONCE DAILY IN THE MORNING  . metolazone (ZAROXOLYN) 2.5 MG tablet TAKE ONE TABLET BY MOUTH EVERY OTHER DAY  . rosuvastatin (CRESTOR) 20 MG tablet Take 1 tablet (20 mg total) by mouth daily.  .Marland Kitchenalbuterol (PROVENTIL HFA;VENTOLIN HFA) 108 (90 BASE) MCG/ACT inhaler Inhale 2 puffs into the lungs every 6 (six) hours as needed for wheezing or shortness of breath.  . Blood Glucose Monitoring Suppl (ONE TOUCH ULTRA SYSTEM KIT) w/Device KIT Use as directed daily  . cetirizine (ZYRTEC) 10 MG tablet Take 1 tablet (10 mg total) by mouth daily. (Patient not taking: Reported on 11/26/2016)  . furosemide (LASIX) 40 MG tablet Take 1 tablet (40 mg total) by mouth daily. (Patient not taking: Reported on 11/26/2016)  . glucose blood test strip Use as instructed  . Lancets MISC Use as directed once daily  . sildenafil (REVATIO) 20 MG tablet Take 1 tablet (20 mg total) by mouth as directed. Take 2-4 tablets by mouth as needed (Patient taking differently: Take 40 mg by mouth daily as needed (ed). )  . silver sulfADIAZINE (SILVADENE) 1 % cream Apply 1 application topically daily. (Patient not taking: Reported on 11/26/2016)  . spironolactone (ALDACTONE) 25 MG tablet Take 0.5 tablets (12.5 mg total) by mouth daily. (Patient not taking: Reported on 11/26/2016)  . Tadalafil 2.5 MG TABS  Take 1 tablet (2.5 mg total) by mouth daily as needed. (Patient taking differently: Take 2.5 mg by mouth daily as needed (erectile dysfunction). )   FAMILY HISTORY:  His indicated that his mother is deceased. He indicated that his father is deceased. He indicated that his sister is alive. He indicated that his brother is alive. He indicated that his maternal grandmother is deceased.  He indicated that his maternal grandfather is deceased. He indicated that his paternal grandmother is deceased. He indicated that his paternal grandfather is deceased.   SOCIAL HISTORY: He  reports that he has quit smoking. He has never used smokeless tobacco. He reports that he drinks alcohol. He reports that he does not use drugs.  REVIEW OF SYSTEMS:   Review of Systems  Unable to perform ROS: Critical illness   VITAL SIGNS: BP 123/73   Pulse 84   Temp (!) 97.5 F (36.4 C) (Oral)   Resp 14   Ht _0  (1.727 m)   Wt 90.7 kg (200 lb)   SpO2 92%   BMI 30.41 kg/m   HEMODYNAMICS:  Levophed @ 10, Epi @ 2  VENTILATOR SETTINGS: Vent Mode: PRVC FiO2 (%):  [100 %] 100 % Set Rate:  [14 bmp] 14 bmp Vt Set:  [550 mL] 550 mL PEEP:  [5 cmH20] 5 cmH20 Plateau Pressure:  [35 cmH20] 35 cmH20  INTAKE / OUTPUT: No intake/output data recorded.  PHYSICAL EXAMINATION: General: Elderly male, Intubated and Sedated, Critically ill Neuro: Deeply sedated, no response to sternal rub HEENT: PERRL, OP clear, MM moist, 7.5 ETT in place; NG tube in place  Cardiovascular: Tachycardic with a regular rhythm, no m/r/g Lungs: CTA b/l Abdomen: Distended, large midline surgical incision intact with dressings c/d/i; Absent bowel sounds Musculoskeletal: no LE edema; Right hand noticeably cooler than left hand but radial pulse is good on doppler.  Skin: no rashes  LABS:  BMET  Recent Labs Lab 11/26/16 1553  NA 134*  K 3.1*  CL 92*  CO2 23  BUN 43*  CREATININE 1.99*  GLUCOSE 258*    Electrolytes  Recent Labs Lab 11/26/16 1553  CALCIUM 9.2    CBC  Recent Labs Lab 11/26/16 1553  WBC 7.6  HGB 17.4*  HCT 47.9  PLT 240    Coag's  Recent Labs Lab 11/26/16 2315  INR 1.23    Sepsis Markers  Recent Labs Lab 11/26/16 2306 11/26/16 2342  LATICACIDVEN 9.49* 7.5*    ABG No results for input(s): PHART, PCO2ART, PO2ART in the last 168 hours.  Liver Enzymes  Recent  Labs Lab 11/26/16 1553  AST 36  ALT 20  ALKPHOS 86  BILITOT 3.0*  ALBUMIN 4.2    Cardiac Enzymes No results for input(s): TROPONINI, PROBNP in the last 168 hours.  Glucose  Recent Labs Lab 11/26/16 2248  GLUCAP 199*    Imaging Dg Chest Portable 1 View  Result Date: 11/27/2016 CLINICAL DATA:  Respiratory failure.  Post intubation. EXAM: PORTABLE CHEST 1 VIEW COMPARISON:  11/26/2016 FINDINGS: Low lung volumes with stable cardiomegaly. Aortic atherosclerosis. ICD device projects over the left axilla with lead projecting over the right ventricle. New endotracheal tube is noted with tip 4 cm above the carina. Mild perihilar vascular congestion is seen with slight increase in airspace confluence in the right upper lobe. No acute nor suspicious osseous abnormalities. Cervical and thoracic spondylosis. IMPRESSION: 1. Low lung volumes with stable cardiomegaly and aortic atherosclerosis. Mild central vascular congestion. 2. Endotracheal tube tip is 4 cm  above the carina in satisfactory position. Electronically Signed   By: Tollie Eth M.D.   On: 11/27/2016 02:43   Dg Chest Port 1 View  Result Date: 11/26/2016 CLINICAL DATA:  Difficulty breathing. EXAM: PORTABLE CHEST 1 VIEW COMPARISON:  01/03/2015 FINDINGS: Stable mild cardiomegaly. Stable appearance of single lead ICD. Lungs show low volumes with bibasilar atelectasis, right greater than left. No overt edema or pleural fluid. IMPRESSION: Low bilateral lung volumes with bibasilar atelectasis. Electronically Signed   By: Irish Lack M.D.   On: 11/26/2016 23:41   Dg Abd Portable 1 View  Result Date: 11/27/2016 CLINICAL DATA:  Orogastric tube placement EXAM: PORTABLE ABDOMEN - 1 VIEW COMPARISON:  11/26/2016 FINDINGS: The tip and side port of a gastric tube are seen coiled in the left upper quadrant of the abdomen in the expected location of the stomach. Moderate degree of fecal retention is seen within large bowel. Central gas collection is  again noted on the supine view. No radio-opaque calculi. IMPRESSION: Gastric tube with side port noted in the expected location of the stomach. Centrally located gas collection is again seen possibly representing free air as before. Electronically Signed   By: Tollie Eth M.D.   On: 11/27/2016 03:04   Dg Abd Portable 2v  Result Date: 11/26/2016 CLINICAL DATA:  Abdominal pain, distention, nausea and vomiting. EXAM: PORTABLE ABDOMEN - 2 VIEW COMPARISON:  None. FINDINGS: There is a large amount of fecal material throughout the colon. There is a collection of amorphous gas in the midline mid upper abdomen on the supine film. On the decubitus film there is some air outlining the lateral margin of the liver. There is associated air-fluid level. While this may be an a distended loop of bowel, free intraperitoneal air cannot be excluded and there may be some free fluid in the abdomen is well. CT of the abdomen and pelvis would be needed to exclude perforated viscus. No abnormal calcifications are seen. Diffuse degenerative disease of the lumbar spine present. IMPRESSION: Large amount of fecal material in the colon. Amorphous gas in the midline mid to upper abdomen on the supine film. On the decubitus film, there is an elongated air-fluid level with visualization of the lateral margin of the liver. Free intraperitoneal air cannot be excluded as well as free fluid in the peritoneal cavity. Recommend correlation with CT of the abdomen and pelvis. These results were called by telephone at the time of interpretation on 11/26/2016 at 11:48 pm to [facility], PA-C, who verbally acknowledged these results. Electronically Signed   By: Irish Lack M.D.   On: 11/26/2016 23:49   STUDIES:  AXR 8/10 > air fluid level noted, free air can not be excluded. CTA chest / abd / pelv 8/10 > pneumoperitoneum.  CULTURES: Blood 8/10 >  Wound 8/10 >   ANTIBIOTICS: Vanc 8/10 x 1. Zosyn 8/10 >   SIGNIFICANT EVENTS: 8/10 >  admit.  LINES/TUBES: ETT 8/10 >  R IJ (double lumen) CVL 8/10 >  R brachial a.line 8/10 >  Foley catheter RUE 20G PIV, LUE 2-20G PIV's  DISCUSSION: 71 y.o. M admitted 8/9 with pneumoperitoneum due to perforated pyloric ulcer.  He was taken to the OR for ex-lap and repair then returned to ICU on vent.  ASSESSMENT / PLAN:  PULMONARY 1. Acute hypoxic Respiratory failure; CHF exacerbation - CXR on my review shows pulmonary edema that was not present on initial CXR; this is likely due to the IVF's he received as part of sepsis  protocol - Continue mechanical ventilation - Check ABG now; weaned FIO2 from 100% to 50% - continue GI and DVT prophylaxis - bronchial hygiene - Repeat CXR in AM - Unable to diurese until off vasopressors  CARDIOVASCULAR 1. Hx PAD, sCHF with NICM (s/p ICD placement 2014, echo from Feb 2016 with EF 15%, G2DD), HTN, HLD. - Trend troponin. - Continue preadmission ASA, rosuvastatin. - Hold preadmission carvedilol, furosemide, metolazone, spironolactone.  RENAL 1. Hyponatremia: - change IVF's from 1/2NS to NS gtt (I believe the 1/2NS was ordered but not infusing yet) - continue to monitor closely  2. Hypokalemia: - K 3.1; repleted with 30MEQ KCL IV already - repeat BMP now  3. AKI;  - creatinine 1.99 on admission, up from baseline of 1.07 - continue IVF's - check UA - Foley and monitor UOP closely  GASTROINTESTINAL 1. Perforated pyloric channel ulcer - s/p repair 8/10 (Dr. Lucia Gaskins). - start PPI 28m IV BID - NPO - NG tube to low-intermittent suction - Post-op care per surgery  HEMATOLOGIC 1. VTE prophylaxis. - SCD's / Heparin. - CBC in AM.  INFECTIOUS 1. Septic shock due to Peritonitis - due to perforated pyloric ulcer with 2400cc gastric contents in abdominal cavity - s/p 4L IVF bolus; continue NS @ 125cc/hr - Continue levophed as needed for goal MAP > 65; Wean Epi down as tolerated - Assess CVP now - Trend lactate and procalcitonin; check  cortisol - continue Vanc and Zosyn; add Fluconazole - Follow cultures.  ENDOCRINE 1. DM: - NPO; SSI - Hold preadmission metformin.  NEUROLOGIC 1. Acute encephalopathy - due to sedation. Sedation: Fentanyl gtt / Midazolam PRN. RASS goal: 0 to -1. Daily WUA.   60 minutes critical care time  KVernie Murders MD Pulmonary and CRosaPager: [phone] 11/27/2016, 4:53 AM

## 2016-11-27 NOTE — Progress Notes (Signed)
71 year old man with ischemic cardiomyopathy, diabetic hypertensive in poor health, admitted 8/9 with gastric ulcer perforation and peritonitis. Underwent emergent laparotomy with omental patch repair He was in [place] shock requiring epinephrine and Levophed drip, today he appears also to be in renal failure with decreasing urine output and increase and creatinine from baseline 1.0 to 2. Shock appears to be improved and he is transitioned off epinephrine drip Labs show hypokalemia and hypomagnesemia and ABG shows metabolic acidosis with mild leukopenia Chest x-ray shows ET tube in good position, small effusions and low lung volumes  Impression/plan Septic shock- continue to taper Levophed as blood pressure improves AKI - continue hydration, bicarbonate drip and dextrose for severe metabolic acidosis, increased respiratory rate to 25 , not I good candidate for dialysis, remains to be seen Peritonitis-broad-spectrum antibiotics and Diflucan, discontinue vancomycin Acute respiratory failure-ventilator settings reviewed and adjusted, hold off on spontaneous breathing trials until shock improved  Daughter updated at bedside in detail Additional critical care time 30 minutes  Oretha Milch. MD

## 2016-11-27 NOTE — Progress Notes (Signed)
CRITICAL VALUE ALERT  Critical Value:  Troponin 0.11  Date & Time Notied:  11/27/2016 1839  Provider Notified: E-Link  Orders Received/Actions taken: None

## 2016-11-27 NOTE — ED Provider Notes (Addendum)
Medical screening examination/treatment/procedure(s) were conducted as a shared visit with non-physician practitioner(s) and myself.  I personally evaluated the patient during the encounter. Briefly, the patient is a 71 y.o. male here with several days of abd pain and N/V. Workup consistent with perforated bowel secondary to likely colonic ischemia from constipation.  Patient became hypotensive but responded to IV fluids. Code sepsis initiated and patient was treated empirically with antibiotics.  Surgery consultation for surgical intervention and further management.    EKG Interpretation  Date/Time:  Thursday November 26 2016 22:57:20 EDT Ventricular Rate:  75 PR Interval:    QRS Duration: 171 QT Interval:  517 QTC Calculation: 578 R Axis:   -88 Text Interpretation:  Atrial fibrillation Ventricular premature complex RBBB and LAFB Artifact in lead(s) I III aVL V5 No significant change since last tracing Confirmed by Drema Pry [phone]) on 11/26/2016 11:13:59 PM        CRITICAL CARE Performed by: Amadeo Garnet Norberta Stobaugh Total critical care time: 35 minutes Critical care time was exclusive of separately billable procedures and treating other patients. Critical care was necessary to treat or prevent imminent or life-threatening deterioration. Critical care was time spent personally by me on the following activities: development of treatment plan with patient and/or surrogate as well as nursing, discussions with consultants, evaluation of patient's response to treatment, examination of patient, obtaining history from patient or surrogate, ordering and performing treatments and interventions, ordering and review of laboratory studies, ordering and review of radiographic studies, pulse oximetry and re-evaluation of patient's condition.  Patient care turned over to Dr Ward at 0130. Patient case and results discussed in detail; please see their note for further ED managment.       Nira Conn, MD 11/27/16 [phone]

## 2016-11-27 NOTE — Op Note (Signed)
11/26/2016 - 11/27/2016  4:35 AM  PATIENT:  Jacob Pittman, 71 y.o., male, MRN: [id number]  PREOP DIAGNOSIS:  pnemo peritoneum  POSTOP DIAGNOSIS:   Perforated pyloric channel ulcer, 2,400 of gastric contents in abdominal cavity  PROCEDURE:   Procedure(s): EXPLORATORY LAPAROTOMY, Oversew pyloric channel ulcer, graham omental patch repair perforated ulcer  SURGEON:   Ovidio Kin, M.D.  ASSISTANT:   None  ANESTHESIA:   general  Anesthesiologist: Dorris Singh, MD CRNA: Paris Lore, CRNA  General  EBL:  100  ml  BLOOD ADMINISTERED: 500 cc of albumin  DRAINS: 19 F Blake  LOCAL MEDICATIONS USED:   none  SPECIMEN:   none  COUNTS CORRECT:  YES  INDICATIONS FOR PROCEDURE:  Jacob Pittman is a 71 y.o. (DOB: 1945/08/20) AA male whose primary care physician is [name], MD.     He presented with a pneumoperitoneum, hypotension, and was intubated in the ER.   The indications and risks of the surgery were explained to his daughters, Vikki Ports and Joni Reining.  The risks include, but are not limited to, infection, bleeding, pulmonary failure, kidney failure, heart failure, and death.  PROCEDURE:   The patient was taken to Room #1 at [facility] OR.  He was intubated on arrival.  Dr. Chilton Si placed a right IJ cath and an A line.  His abdomen was shaved and prepped with chloroprep and sterilely draped.    A time out was held and the surgical check list run.   I made an upper midline abdominal incision.   I aspirated 2,400 cc of greenish, mildly foul smelling intra-abdominal fluid (this is gastric contents).   I did an exploration.  His liver was okay.  His stomach was full of fluid.  He had a 1.0 cm ulcer/perforation in his pyloric channel at his pylorus.  I think this is the source of the fluid/pneumoperitoneum/sepsis.   I ran his small bowel, which was okay.  He has small umbilical hernia with omentum in the umbilical hernia.  I reduced the omentum to do the exploration.   He has a very  redundant sigmoid colon.  His colon is full of stool and he has a lot of stool in his distal colon towards his rectum, but the bowel otherwise looked okay.   I used three  2-0 silk to close the perforated ulcer.  I placed an omental patch over the ulcer.  I irrigated the abdomen with 7,000 cc of saline until the irrigant was clear.   I placed a 19 F drain in the RUQ and placed the tip near the ulcer repair.   I closed the abdomen with running #1 Novafil suture.  I placed some interrupted #1 PDS sutures.  I left the wound open and packed it with saline gauze.  The sponge and needle count were correct at the end of the case.   The patient remains intubated, will go to the ICU.     I spoke with Dr. Marisue Ivan Deterding at the end of the case for Critical Care assistance and management.  Ovidio Kin, MD, [facility] Surgery Pager: [phone] Office phone:  [phone]

## 2016-11-27 NOTE — ED Notes (Signed)
Patient was intubated by Dahlia Client PA ETT 7 1/2, 23 at the lip, and 21 at the teeth.

## 2016-11-27 NOTE — ED Provider Notes (Signed)
1:30 AM  Assumed care from Dr. Eudelia Bunch.  Pt is a 71 y.o. male with history of diabetes, hypertension, CHF with an EF of 15% on echocardiogram in 2016 status post ICD placement 2 presented to the emergency department with abdominal pain. He had constipation and no bowel movement for 2 weeks. Patient CT scan consistent with bowel perforation. Patient receiving vancomycin and Zosyn and has received 4 L IV fluid bolus for concerns for sepsis. Lactate is extremely elevated but improving. On reevaluation, patient beginning to feel short of breath, speaking short sentences, wheezing, bibasilar Rales. Decision was made to intubate patient given worsening respiratory status in the setting of CHF and because he would need intubation prior to surgery. Dr. Ezzard Standing with general surgery consulted to see patient emergently. Patient confirms he is a full code. His girlfriend is at bedside. Daughter has been contacted and is on her way.   Intubation performed by Dahlia Client Muthersbaugh PA without incident. Chest x-ray confirmed placement. Please see her note for further details. Patient sedated using propofol. Patient is on Levophed and did receive a push dose of Neo-Synephrine after intubation for mild hypotension.   Dr. Ezzard Standing at bedside discussing surgery with patient's girlfriend. Patient will be taken to the operating room emergently.    EKG Interpretation  Date/Time:  Thursday November 26 2016 22:57:20 EDT Ventricular Rate:  75 PR Interval:    QRS Duration: 171 QT Interval:  517 QTC Calculation: 578 R Axis:   -88 Text Interpretation:  Atrial fibrillation Ventricular premature complex RBBB and LAFB Artifact in lead(s) I III aVL V5 No significant change since last tracing Confirmed by Drema Pry [phone]) on 11/26/2016 11:13:59 PM        CRITICAL CARE Performed by: Raelyn Number   Total critical care time: 30 minutes  Critical care time was exclusive of separately billable procedures and treating other  patients.  Critical care was necessary to treat or prevent imminent or life-threatening deterioration.  Critical care was time spent personally by me on the following activities: development of treatment plan with patient and/or surrogate as well as nursing, discussions with consultants, evaluation of patient's response to treatment, examination of patient, obtaining history from patient or surrogate, ordering and performing treatments and interventions, ordering and review of laboratory studies, ordering and review of radiographic studies, pulse oximetry and re-evaluation of patient's condition.    Reneta Niehaus, Layla Maw, DO 11/27/16 [phone]

## 2016-11-27 NOTE — Consult Note (Addendum)
Re:   Jacob Pittman DOB:   October 06, 1945 MRN:   [id number]  Chief Complaint Abdominal pain  ASSESEMENT AND PLAN: 1.  Pneumoperitoneum  Will need to go to OR emergently, discussed with girlfriend and daughters (by phone).  Risk of death explained.  Possible need for colostomy, bowel resection.  Also risk of failing organs in the face of sepsis.  He'll be in the ICU post op.   2.  Lactic acidosis  3.  Acute respiratory failure  Patient intubated in ER  4. History of CHF  5. Non ischemic cardiomyopathy  AICD implanted - 03/2013 -  Sees Dr. Beckie Salts. 6.  DM  Glucose - 258 - 11/26/2016 7.  Hemoconcentrated   Hgb - 17.4 - 11/26/2016 8. ARF   Creat - 1.99 - 11/26/2016 9.  Small umbilical hernia  Chief Complaint  Patient presents with  . Abdominal Pain   PHYSICIAN REQUESTING CONSULTATION:  Dr. Cyril Mourning Ward, [facility]  HISTORY OF PRESENT ILLNESS: Jacob Pittman is a 71 y.o. (DOB: 06-09-1945)  AA male whose primary care physician is Biagio Borg, MD. He is intubated. His girlfriend, Wallace Going, is at the bedside. The history is taken from the chart and from Lavella Lemons - though her knowledge is limited.  The patient presented to the [facility] ER at 15:00 on 11/26/2016 with lower abdominal pain, belching, and no BM x 2 weeks. He had increasing breathing difficulty and was intubated around 1:30 AM. CT has been done, but there are problems with the computer system, so no radiologist can read the films.  From my review, he has pneumoperitoneum with a moderate amount of free fluid.  This argues for a perforated ulcer.    Past Medical History:  Diagnosis Date  . ANXIETY 02/04/2009  . CHF (congestive heart failure) ([place])   . Chronic systolic CHF (congestive heart failure) ([place])   . DIABETES MELLITUS, TYPE II 11/07/2008  . 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 HIP PAIN, RIGHT 11/06/2009  . HYPERLIPIDEMIA 11/07/2008  . HYPERTENSION 10/08/2008  . Nonischemic cardiomyopathy [facility])    s/p ICD  implantation 03-2013 by Dr Lovena Le  . OSTEOARTHRITIS, KNEE, RIGHT 10/08/2008  . Peripheral arterial disease [facility])       Past Surgical History:  Procedure Laterality Date  . CARDIAC DEFIBRILLATOR PLACEMENT Left 04/10/13   BTK single chamber ICD implanted by Dr Lovena Le for primary prevention  . IMPLANTABLE CARDIOVERTER DEFIBRILLATOR IMPLANT N/A 04/10/2013   Procedure: IMPLANTABLE CARDIOVERTER DEFIBRILLATOR IMPLANT;  Surgeon: Evans Lance, MD;  Location: [facility] C[facility] LAB;  Service: Cardiovascular;  Laterality: N/A;  . s/p left broken arm with pin     1980's      Current Facility-Administered Medications  Medication Dose Route Frequency Provider Last Rate Last Dose  . morphine 4 MG/ML injection 4 mg  4 mg Intravenous Once Jillyn Ledger, PA-C   Stopped at 11/26/16 2345  . norepinephrine (LEVOPHED) 4 mg in dextrose 5 % 250 mL (0.016 mg/mL) infusion  0-40 mcg/min Intravenous Once Muthersbaugh, Jarrett Soho, PA-C      . ondansetron (ZOFRAN) injection 4 mg  4 mg Intravenous Once Maczis, Michael M, PA-C      . piperacillin-tazobactam (ZOSYN) IVPB 3.375 g  3.375 g Intravenous Once Muthersbaugh, Hannah, PA-C      . potassium chloride 10 mEq in 100 mL IVPB  10 mEq Intravenous Q1 Hr x 3 Maczis, Barth Kirks, PA-C   Stopped at 11/27/16 0018  . sodium chloride 0.9 % bolus 1,000 mL  1,000  mL Intravenous Once Muthersbaugh, Jarrett Soho, PA-C      . vancomycin (VANCOCIN) IVPB 1000 mg/200 mL premix  1,000 mg Intravenous Once Muthersbaugh, Jarrett Soho, PA-C       Current Outpatient Prescriptions  Medication Sig Dispense Refill  . aspirin 81 MG EC tablet Take 81 mg by mouth daily.      . carvedilol (COREG) 12.5 MG tablet TAKE ONE TABLET BY MOUTH TWICE DAILY 60 tablet 2  . fluticasone (FLONASE) 50 MCG/ACT nasal spray Place 2 sprays into both nostrils daily. 16 g 2  . furosemide (LASIX) 40 MG tablet Take 40 mg by mouth daily.    . metFORMIN (GLUCOPHAGE-XR) 500 MG 24 hr tablet TAKE TWO TABLETS BY MOUTH ONCE DAILY IN THE MORNING  180 tablet 0  . metolazone (ZAROXOLYN) 2.5 MG tablet TAKE ONE TABLET BY MOUTH EVERY OTHER DAY 15 tablet 11  . rosuvastatin (CRESTOR) 20 MG tablet Take 1 tablet (20 mg total) by mouth daily. 90 tablet 3  . albuterol (PROVENTIL HFA;VENTOLIN HFA) 108 (90 BASE) MCG/ACT inhaler Inhale 2 puffs into the lungs every 6 (six) hours as needed for wheezing or shortness of breath. 1 Inhaler 5  . Blood Glucose Monitoring Suppl (ONE TOUCH ULTRA SYSTEM KIT) w/Device KIT Use as directed daily 1 each 0  . cetirizine (ZYRTEC) 10 MG tablet Take 1 tablet (10 mg total) by mouth daily. (Patient not taking: Reported on 11/26/2016) 30 tablet 11  . furosemide (LASIX) 40 MG tablet Take 1 tablet (40 mg total) by mouth daily. (Patient not taking: Reported on 11/26/2016) 30 tablet 6  . glucose blood test strip Use as instructed 100 each 12  . Lancets MISC Use as directed once daily 100 each 12  . sildenafil (REVATIO) 20 MG tablet Take 1 tablet (20 mg total) by mouth as directed. Take 2-4 tablets by mouth as needed (Patient taking differently: Take 40 mg by mouth daily as needed (ed). ) 40 tablet 1  . silver sulfADIAZINE (SILVADENE) 1 % cream Apply 1 application topically daily. (Patient not taking: Reported on 11/26/2016) 50 g 0  . spironolactone (ALDACTONE) 25 MG tablet Take 0.5 tablets (12.5 mg total) by mouth daily. (Patient not taking: Reported on 11/26/2016) 30 tablet 0  . Tadalafil 2.5 MG TABS Take 1 tablet (2.5 mg total) by mouth daily as needed. (Patient taking differently: Take 2.5 mg by mouth daily as needed (erectile dysfunction). ) 20 each 0     No Known Allergies  REVIEW OF SYSTEMS: Skin:  No history of rash.  No history of abnormal moles. Infection:  No history of hepatitis or HIV.  No history of MRSA. Neurologic:  No history of stroke.  No history of seizure.  No history of headaches. Cardiac:  History of CHF.  Non ischemic cardiomyopathy.  AICD implanted - 03/2013 - followed by Dr. Beckie Salts. Pulmonary:  Does not  smoke cigarettes.  No asthma or bronchitis.  No OSA/CPAP.  Endocrine:  DM Gastrointestinal:  Limited history because he is intubated.  History of EtOH abuse according to [place]. Urologic:  No history of kidney stones.  No history of bladder infections. Musculoskeletal:  No history of joint or back disease. Hematologic:  No bleeding disorder.  No history of anemia.  Not anticoagulated. Psycho-social:  The patient is oriented.   The patient has no obvious psychologic or social impairment to understanding our conversation and plan.  SOCIAL and FAMILY HISTORY: Married - wife Raquel Sarna - separated, but not divorced.  Girlfriend - Wallace Going -  at bedside 2 daughters:  Kyran Connaughton - [phone]   [name] [place]ord - [phone]  I spoke to both by phone.  PHYSICAL EXAM: BP (!) 95/58   Pulse 87   Temp (!) 97.5 F (36.4 C) (Oral)   Resp (!) 24   SpO2 (!) 88%   General: Older AA M who is sedated and intubated. Skin:  Inspection and palpation - no mass or rash. Eyes:  Conjunctiva and lids unremarkable.            Pupils are equal Ears, Nose, Mouth, and Throat:  He has an endotracheal tube in place. Neck: Supple. No mass, trachea midline.  No thyroid mass. Lymph Nodes:  No supraclavicular, cervical, or inguinal nodes. Lungs: Symmetric breath sounds. Heart:  Palpation of the heart is normal.            Auscultation: irregular rate.  No murmur  Abdomen: Distended. Quiet. Small umbilical hernia. Rectal: Not done. Musculoskeletal:  Paralyzed for intubation. Neurologic:  Paralyzed for intubation. Psychiatric: Intubated, cannot get history.  DATA REVIEWED, COUNSELING AND COORDINATION OF CARE: Epic notes reviewed. Counseling and coordination of care exceeded more than 50% of the time spent with patient. Total time spent with patient and charting: 60 minutes.  Alphonsa Overall, MD,  [facility] Surgery, [place]  [place], [place]     [place] Phone:  [phone] FAX:  [phone]

## 2016-11-27 NOTE — Progress Notes (Addendum)
Pharmacy Antibiotic Note  Jacob Pittman is a 71 y.o. male admitted on 11/26/2016 with intra-abdominal infection.  Pharmacy has been consulted for zosyn and diflucan dosing.  Plan: Zosyn 3.375g IV q8h (4 hour infusion).  Diflucan 400 mg x1 then 200 mg IV q24h  Height: 5\' 8"  (172.7 cm) Weight: 200 lb (90.7 kg) IBW/kg (Calculated) : 68.4  Temp (24hrs), Avg:97.5 F (36.4 C), Min:97.5 F (36.4 C), Max:97.5 F (36.4 C)   Recent Labs Lab 11/26/16 1553 11/26/16 2306 11/26/16 2342  WBC 7.6  --   --   CREATININE 1.99*  --   --   LATICACIDVEN  --  9.49* 7.5*    Estimated Creatinine Clearance: 36.1 mL/min (A) (by C-G formula based on SCr of 1.99 mg/dL (H)).    No Known Allergies  Antimicrobials this admission: 8/10 zosyn >>  8/10 diflucan >> 8/10 vancomycin >> x1 Dose adjustments this admission:   Microbiology results:  BCx:   UCx:    Sputum:    MRSA PCR:   Thank you for allowing pharmacy to be a part of this patient's care.  Lorenza Evangelist 11/27/2016 5:19 AM

## 2016-11-27 NOTE — Care Management Note (Addendum)
Case Management Note  Patient Details  Name: Vardan Treichler MRN: [id number] Date of Birth: 02/05/1946  Subjective/Objective:      Perforated pyloric channel ulcer with repair-s/p or had hypoxia and rhonchi   Remains on the vent with full support           Action/Plan: Date:  November 27, 2016 Chart reviewed for concurrent status and case management needs. Will continue to follow patient progress. Discharge Planning: following for needs Expected discharge date: [id number] Marcelle Smiling, BSN, [place], [place]   [phone]  Expected Discharge Date:                  Expected Discharge Plan:  Home/Self Care  In-House Referral:     Discharge planning Services  CM Consult  Post Acute Care Choice:    Choice offered to:     DME Arranged:    DME Agency:     HH Arranged:    HH Agency:     Status of Service:  In process, will continue to follow  If discussed at Long Length of Stay Meetings, dates discussed:    Additional Comments:  Golda Acre, RN 11/27/2016, 8:33 AM

## 2016-11-27 NOTE — Progress Notes (Signed)
CRITICAL VALUE ALERT  Critical Value:  Lactic Acid of 3.0, K 2.5, Troponin 0.04  Date & Time Notied:  LA received at 0624, K/Troponin received at 0644 11/27/16  Provider Notified: Notified MD Jeraldine Loots   Orders Received/Actions taken: Orders will be placed, reported to Kevan Ny RN

## 2016-11-27 NOTE — ED Notes (Signed)
Patient taken to OR by anesthesia with ED transport monitor.

## 2016-11-27 NOTE — Anesthesia Procedure Notes (Signed)
Arterial Line Insertion Start/End8/01/2017 3:05 AM, 11/20/2016 3:20 AM  Preanesthetic checklist: patient identified, IV checked, site marked, risks and benefits discussed, surgical consent, monitors and equipment checked, pre-op evaluation and timeout performed Right, brachial was placed Hand hygiene performed  and Seldinger technique used  Attempts: 1 Procedure performed without using ultrasound guided technique. Following insertion, dressing applied. Patient tolerated the procedure well with no immediate complications.

## 2016-11-27 NOTE — Progress Notes (Signed)
ABG shows combination respiratory and metabolic acidosis. Increased RR from 16 to 20. Change NS gtt to D5Bicarb gtt. (chosing D5 over Free water as Blood glucose 77 now). Repeat ABG later today.   His CVP is 19, so overall would like to decrease IVF's receiving. Still in shock on levophed. NS was at 125cc/hr; will order Bicarb gtt for 100cc/hr instead.

## 2016-11-28 ENCOUNTER — Other Ambulatory Visit (HOSPITAL_COMMUNITY): Payer: Medicare PPO

## 2016-11-28 DIAGNOSIS — R6521 Severe sepsis with septic shock: Secondary | ICD-10-CM

## 2016-11-28 DIAGNOSIS — A419 Sepsis, unspecified organism: Principal | ICD-10-CM

## 2016-11-28 DIAGNOSIS — N179 Acute kidney failure, unspecified: Secondary | ICD-10-CM

## 2016-11-28 LAB — BLOOD GAS, ARTERIAL
ACID-BASE DEFICIT: 2.9 mmol/L — AB (ref 0.0–2.0)
Bicarbonate: 19.3 mmol/L — ABNORMAL LOW (ref 20.0–28.0)
DRAWN BY: 418751
FIO2: 40
MECHVT: 550 mL
O2 SAT: 97.9 %
PCO2 ART: 27.9 mmHg — AB (ref 32.0–48.0)
PEEP: 5 cmH2O
PH ART: 7.454 — AB (ref 7.350–7.450)
Patient temperature: 98.6
RATE: 25 resp/min
pO2, Arterial: 110 mmHg — ABNORMAL HIGH (ref 83.0–108.0)

## 2016-11-28 LAB — GLUCOSE, CAPILLARY
GLUCOSE-CAPILLARY: 137 mg/dL — AB (ref 65–99)
GLUCOSE-CAPILLARY: 54 mg/dL — AB (ref 65–99)
GLUCOSE-CAPILLARY: 106 mg/dL — AB (ref 65–99)
GLUCOSE-CAPILLARY: 110 mg/dL — AB (ref 65–99)
GLUCOSE-CAPILLARY: 32 mg/dL — AB (ref 65–99)
GLUCOSE-CAPILLARY: 106 mg/dL — AB (ref 65–99)
Glucose-Capillary: 112 mg/dL — ABNORMAL HIGH (ref 65–99)
Glucose-Capillary: 129 mg/dL — ABNORMAL HIGH (ref 65–99)
Glucose-Capillary: 82 mg/dL (ref 65–99)
Glucose-Capillary: 86 mg/dL (ref 65–99)
Glucose-Capillary: 95 mg/dL (ref 65–99)

## 2016-11-28 LAB — CBC
HEMATOCRIT: 39 % (ref 39.0–52.0)
Hemoglobin: 13.6 g/dL (ref 13.0–17.0)
MCH: 34.3 pg — ABNORMAL HIGH (ref 26.0–34.0)
MCHC: 34.9 g/dL (ref 30.0–36.0)
MCV: 98.2 fL (ref 78.0–100.0)
Platelets: 124 10*3/uL — ABNORMAL LOW (ref 150–400)
RBC: 3.97 MIL/uL — ABNORMAL LOW (ref 4.22–5.81)
RDW: 13.5 % (ref 11.5–15.5)
WBC: 9.8 10*3/uL (ref 4.0–10.5)

## 2016-11-28 LAB — PREALBUMIN: Prealbumin: 6.5 mg/dL — ABNORMAL LOW (ref 18–38)

## 2016-11-28 LAB — TROPONIN I
Troponin I: 0.17 ng/mL (ref <0.03)
Troponin I: 0.17 ng/mL (ref <0.03)

## 2016-11-28 LAB — BASIC METABOLIC PANEL
ANION GAP: 18 — AB (ref 5–15)
BUN: 52 mg/dL — ABNORMAL HIGH (ref 6–20)
CO2: 21 mmol/L — AB (ref 22–32)
Calcium: 7 mg/dL — ABNORMAL LOW (ref 8.9–10.3)
Chloride: 96 mmol/L — ABNORMAL LOW (ref 101–111)
Creatinine, Ser: 2.81 mg/dL — ABNORMAL HIGH (ref 0.61–1.24)
GFR calc Af Amer: 24 mL/min — ABNORMAL LOW (ref >60)
GFR calc non Af Amer: 21 mL/min — ABNORMAL LOW (ref >60)
GLUCOSE: 137 mg/dL — AB (ref 65–99)
POTASSIUM: 4 mmol/L (ref 3.5–5.1)
Sodium: 135 mmol/L (ref 135–145)

## 2016-11-28 LAB — PHOSPHORUS: PHOSPHORUS: 3.5 mg/dL (ref 2.5–4.6)

## 2016-11-28 LAB — MAGNESIUM: MAGNESIUM: 2.3 mg/dL (ref 1.7–2.4)

## 2016-11-28 LAB — CARBOXYHEMOGLOBIN - COOX: CARBOXYHEMOGLOBIN: 0.7 % (ref 0.5–1.5)

## 2016-11-28 MED ORDER — DEXTROSE 10 % IV SOLN
INTRAVENOUS | Status: AC
  Administered 2016-11-28 – 2016-11-30 (×3): via INTRAVENOUS
  Filled 2016-11-28 (×6): qty 1000

## 2016-11-28 MED ORDER — ACETAMINOPHEN 650 MG RE SUPP
650.0000 mg | Freq: Once | RECTAL | Status: AC
  Administered 2016-11-28: 650 mg via RECTAL
  Filled 2016-11-28: qty 1

## 2016-11-28 MED ORDER — SODIUM CHLORIDE 0.9 % IV SOLN
INTRAVENOUS | Status: DC
  Administered 2016-11-28 – 2016-12-02 (×2): via INTRAVENOUS
  Administered 2016-12-03: 500 mL via INTRAVENOUS
  Administered 2016-12-23 – 2016-12-27 (×2): via INTRAVENOUS

## 2016-11-28 MED ORDER — DEXTROSE 50 % IV SOLN
INTRAVENOUS | Status: AC
  Administered 2016-11-28: 50 mL
  Filled 2016-11-28: qty 50

## 2016-11-28 MED ORDER — ACETAMINOPHEN 160 MG/5ML PO SOLN
650.0000 mg | Freq: Once | ORAL | Status: AC
  Administered 2016-11-28: 650 mg

## 2016-11-28 MED ORDER — HEPARIN SODIUM (PORCINE) 5000 UNIT/ML IJ SOLN
5000.0000 [IU] | Freq: Three times a day (TID) | INTRAMUSCULAR | Status: DC
  Administered 2016-11-28 – 2016-11-30 (×6): 5000 [IU] via SUBCUTANEOUS
  Filled 2016-11-28 (×6): qty 1

## 2016-11-28 MED ORDER — FENTANYL CITRATE (PF) 100 MCG/2ML IJ SOLN
25.0000 ug | INTRAMUSCULAR | Status: DC | PRN
  Administered 2016-11-29 – 2016-12-01 (×8): 50 ug via INTRAVENOUS
  Filled 2016-11-28 (×7): qty 2

## 2016-11-28 MED ORDER — VASOPRESSIN 20 UNIT/ML IV SOLN
0.0300 [IU]/min | INTRAVENOUS | Status: DC
  Administered 2016-11-28: 0.03 [IU]/min via INTRAVENOUS
  Filled 2016-11-28: qty 2

## 2016-11-28 MED ORDER — HYDROCORTISONE NA SUCCINATE PF 100 MG IJ SOLR
50.0000 mg | Freq: Four times a day (QID) | INTRAMUSCULAR | Status: DC
  Administered 2016-11-28 – 2016-11-29 (×5): 50 mg via INTRAVENOUS
  Filled 2016-11-28 (×5): qty 2

## 2016-11-28 MED ORDER — ACETAMINOPHEN 160 MG/5ML PO SOLN
650.0000 mg | Freq: Once | ORAL | Status: DC
  Filled 2016-11-28: qty 20.3

## 2016-11-28 NOTE — Progress Notes (Signed)
General Surgery [facility]  Assessment & Plan: Perforated pyloric channel ulcer, 2,400 of gastric contents in abdominal cavity S/p EXPLORATORY LAPAROTOMY, Overse[place] pyloric channel ulcer,graham omentalpatch repair perforated ulcer, 11/26/16, Dr. 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 edema Hypertension Type II diabetes PVD Osteoarthritis right knee ? Hx of ETOH use FEN:  IV fluids/ bicarbonate drip/NPO ID:  Vancomycin x 1 dose.  Zosyn 11/27/16 =>>day 2  Diflucan 11/27/16 =>> day 2 DVT:  Lovenox to start 11/28/16 0800/SCD  Febrile this AM to 105 degrees.  Discussed with Dr. Vassie Loll.  Tylenol given PR.  Receiving stress dose steroids.  OK to give heparin from surgical standpoint if necessary.        Velora Heckler, MD, [facility]       Office: [phone]    Chief Complaint: Perforated gastric ulcer  Subjective: Patient in ICU, on vent.  Nursing at bedside.  Objective: Vital signs in last 24 hours: Temp:  [97.5 F (36.4 C)-105.5 F (40.8 C)] 104.2 F (40.1 C) (08/11 0912) Pulse Rate:  [80-105] 86 (08/11 0330) Resp:  [20-37] 32 (08/11 0900) BP: (91-126)/(52-78) 118/57 (08/11 0900) SpO2:  [92 %-100 %] 92 % (08/11 0900) Arterial Line BP: (100-115)/(47-59) 110/56 (08/11 0500) FiO2 (%):  [40 %-50 %] 40 % (08/11 0330) Weight:  [97.1 kg (214 lb 1.1 oz)] 97.1 kg (214 lb 1.1 oz) (08/11 0401)    Intake/Output from previous day: 08/10 0701 - 08/11 0700 In: 4301.3 [I.V.:3521.3; IV Piggyback:550] Out: 505 [Urine:385; Emesis/NG output:50; Drains:70] Intake/Output this shift: No intake/output data recorded.  Physical Exam: HEENT - sclerae clear, mucous membranes moist Neck - soft, right IJ line in place Chest - coarse bilaterally Cor - irreg, tachy Abdomen - moderate distension; quiet; dressings dry and intact; JP drain with thin yellow  fluid  Lab Results:   Recent Labs  11/27/16 0740 11/28/16 0200  WBC 3.7* 9.8  HGB 14.4 13.6  HCT 41.5 39.0  PLT 184 124*   BMET  Recent Labs  11/27/16 1401 11/28/16 0200  NA 138 135  K 4.2 4.0  CL 105 96*  CO2 18* 21*  GLUCOSE 147* 137*  BUN 46* 52*  CREATININE 2.46* 2.81*  CALCIUM 7.5* 7.0*   PT/INR  Recent Labs  11/26/16 2315  LABPROT 15.6*  INR 1.23   Comprehensive Metabolic Panel:    Component Value Date/Time   NA 135 11/28/2016 0200   NA 138 11/27/2016 1401   K 4.0 11/28/2016 0200   K 4.2 11/27/2016 1401   CL 96 (L) 11/28/2016 0200   CL 105 11/27/2016 1401   CO2 21 (L) 11/28/2016 0200   CO2 18 (L) 11/27/2016 1401   BUN 52 (H) 11/28/2016 0200   BUN 46 (H) 11/27/2016 1401   CREATININE 2.81 (H) 11/28/2016 0200   CREATININE 2.46 (H) 11/27/2016 1401   CREATININE 0.91 03/04/2015 1618   CREATININE 1.25 (H) 01/01/2015 1012   GLUCOSE 137 (H) 11/28/2016 0200   GLUCOSE 147 (H) 11/27/2016 1401   CALCIUM 7.0 (L) 11/28/2016 0200   CALCIUM 7.5 (L) 11/27/2016 1401   AST 80 (H) 11/27/2016 0740   AST 62 (H) 11/27/2016 0530   ALT 37 11/27/2016 0740   ALT 27 11/27/2016 0530   ALKPHOS 38 11/27/2016 0740   ALKPHOS 36 (L) 11/27/2016 0530   BILITOT 2.6 (H) 11/27/2016 0740   BILITOT  2.0 (H) 11/27/2016 0530   PROT 5.2 (L) 11/27/2016 0740   PROT 4.9 (L) 11/27/2016 0530   ALBUMIN 3.0 (L) 11/27/2016 0740   ALBUMIN 2.8 (L) 11/27/2016 0530    Studies/Results: Dg Chest Port 1 View  Result Date: 11/27/2016 CLINICAL DATA:  Central line placement.  Initial encounter. EXAM: PORTABLE CHEST 1 VIEW COMPARISON:  Chest radiograph performed earlier today at 1:38 a.m. FINDINGS: The patient's endotracheal tube is seen ending 2-3 cm above the carina. A right IJ line is noted ending about the mid SVC. And enteric tube is noted extending below the diaphragm. The lungs are mildly hypoexpanded. Vascular congestion is noted. Small bilateral pleural effusions are suggested. Increased  interstitial markings may reflect mild interstitial edema. There is no evidence of pneumothorax. The cardiomediastinal silhouette is mildly enlarged. An AICD is noted overlying the left chest wall, with a single lead ending overlying the right ventricle. No acute osseous abnormalities are seen. IMPRESSION: 1. Right IJ line noted ending about the mid SVC. 2. Endotracheal tube seen ending 2-3 cm above the carina. 3. Lungs mildly hypoexpanded. Vascular congestion and mild cardiomegaly. Small bilateral pleural effusions suggested. Increased interstitial markings raise concern for mild interstitial edema. Electronically Signed   By: Roanna Raider M.D.   On: 11/27/2016 05:59   Dg Chest Portable 1 View  Result Date: 11/27/2016 CLINICAL DATA:  Respiratory failure.  Post intubation. EXAM: PORTABLE CHEST 1 VIEW COMPARISON:  11/26/2016 FINDINGS: Low lung volumes with stable cardiomegaly. Aortic atherosclerosis. ICD device projects over the left axilla with lead projecting over the right ventricle. New endotracheal tube is noted with tip 4 cm above the carina. Mild perihilar vascular congestion is seen with slight increase in airspace confluence in the right upper lobe. No acute nor suspicious osseous abnormalities. Cervical and thoracic spondylosis. IMPRESSION: 1. Low lung volumes with stable cardiomegaly and aortic atherosclerosis. Mild central vascular congestion. 2. Endotracheal tube tip is 4 cm above the carina in satisfactory position. Electronically Signed   By: Tollie Eth M.D.   On: 11/27/2016 02:43   Dg Chest Port 1 View  Result Date: 11/26/2016 CLINICAL DATA:  Difficulty breathing. EXAM: PORTABLE CHEST 1 VIEW COMPARISON:  01/03/2015 FINDINGS: Stable mild cardiomegaly. Stable appearance of single lead ICD. Lungs show low volumes with bibasilar atelectasis, right greater than left. No overt edema or pleural fluid. IMPRESSION: Low bilateral lung volumes with bibasilar atelectasis. Electronically Signed   By:  Irish Lack M.D.   On: 11/26/2016 23:41   Dg Abd Portable 1 View  Result Date: 11/27/2016 CLINICAL DATA:  Orogastric tube placement EXAM: PORTABLE ABDOMEN - 1 VIEW COMPARISON:  11/26/2016 FINDINGS: The tip and side port of a gastric tube are seen coiled in the left upper quadrant of the abdomen in the expected location of the stomach. Moderate degree of fecal retention is seen within large bowel. Central gas collection is again noted on the supine view. No radio-opaque calculi. IMPRESSION: Gastric tube with side port noted in the expected location of the stomach. Centrally located gas collection is again seen possibly representing free air as before. Electronically Signed   By: Tollie Eth M.D.   On: 11/27/2016 03:04   Dg Abd Portable 2v  Result Date: 11/26/2016 CLINICAL DATA:  Abdominal pain, distention, nausea and vomiting. EXAM: PORTABLE ABDOMEN - 2 VIEW COMPARISON:  None. FINDINGS: There is a large amount of fecal material throughout the colon. There is a collection of amorphous gas in the midline mid upper abdomen on the supine film.  On the decubitus film there is some air outlining the lateral margin of the liver. There is associated air-fluid level. While this may be an a distended loop of bowel, free intraperitoneal air cannot be excluded and there may be some free fluid in the abdomen is well. CT of the abdomen and pelvis would be needed to exclude perforated viscus. No abnormal calcifications are seen. Diffuse degenerative disease of the lumbar spine present. IMPRESSION: Large amount of fecal material in the colon. Amorphous gas in the midline mid to upper abdomen on the supine film. On the decubitus film, there is an elongated air-fluid level with visualization of the lateral margin of the liver. Free intraperitoneal air cannot be excluded as well as free fluid in the peritoneal cavity. Recommend correlation with CT of the abdomen and pelvis. These results were called by telephone at the time  of interpretation on 11/26/2016 at 11:48 pm to [facility], PA-C, who verbally acknowledged these results. Electronically Signed   By: Irish Lack M.D.   On: 11/26/2016 23:49   Ct Angio Chest/abd/pel For Dissection W And/or Wo Contrast  Result Date: 11/27/2016 CLINICAL DATA:  Acute onset of right lower quadrant abdominal pain, nausea and vomiting. Generalized abdominal distention. Elevated lipase and lactic acid. Initial encounter. EXAM: CT ANGIOGRAPHY CHEST, ABDOMEN AND PELVIS TECHNIQUE: Multidetector CT imaging through the chest, abdomen and pelvis was performed using the standard protocol during bolus administration of intravenous contrast. Multiplanar reconstructed images and MIPs were obtained and reviewed to evaluate the vascular anatomy. CONTRAST:  80 mL of Isovue 370 IV contrast COMPARISON:  Chest radiograph performed 11/26/2016 FINDINGS: CTA CHEST FINDINGS Cardiovascular: There is no evidence of aortic dissection. There is no evidence of aneurysmal dilatation. Scattered calcification is noted along the aortic arch. There is no evidence of central pulmonary embolus. The heart is enlarged. Scattered coronary artery calcifications are seen. An AICD is noted at the left chest wall, with a single lead ending at the right ventricle. Mediastinum/Nodes: No mediastinal lymphadenopathy is seen. No pericardial effusion is identified. The visualized portions of the thyroid gland are unremarkable. No axillary lymphadenopathy is appreciated. Lungs/Pleura: Trace right-sided pleural fluid is noted. Patchy bibasilar airspace opacities likely reflect atelectasis. No pneumothorax is seen. No masses are identified. Musculoskeletal: No acute osseous abnormalities are identified. The visualized musculature is unremarkable in appearance. Review of the MIP images confirms the above findings. CTA ABDOMEN AND PELVIS FINDINGS VASCULAR Aorta: There is no evidence of aortic dissection. There is no evidence of aneurysmal  dilatation. Scattered calcification is seen along the abdominal aorta and its branches. Celiac: The celiac trunk appears intact. SMA: The superior mesenteric artery remains patent, with scattered calcification about the origin of the superior mesenteric artery. Renals: Mild calcification is noted about the proximal renal arteries bilaterally. The renal arteries remain patent. Two left-sided renal arteries are seen. IMA: The inferior mesenteric artery appears grossly patent. Inflow: Scattered calcification is seen along the common and internal iliac arteries bilaterally, and along the common femoral arteries and their branches. Veins: Visualized venous structures are grossly unremarkable. Review of the MIP images confirms the above findings. NON-VASCULAR Hepatobiliary: The liver is unremarkable in appearance. The gallbladder is unremarkable. The common bile duct remains normal in caliber. Pancreas: The pancreas is within normal limits. Spleen: The spleen is unremarkable in appearance. Adrenals/Urinary Tract: The adrenal glands are unremarkable in appearance. Nonspecific perinephric stranding noted bilaterally. There is no evidence of hydronephrosis. No renal or ureteral stones are identified. Bilateral renal cysts are seen.  Stomach/Bowel: Bowel perforation is noted, with a large amount of free air and free fluid noted throughout the abdomen and pelvis. A tiny focus of air along the pylorus and proximal duodenum raises question for the site of perforation. This was better characterized on subsequent surgery. Scattered diverticulosis is noted along the ascending colon, without evidence of diverticulitis. The colon is otherwise grossly unremarkable, though it is surrounded by free fluid and air. There is vague mild soft tissue inflammation about the proximal ileum at the left mid abdomen. Lymphatic: No retroperitoneal or pelvic sidewall lymphadenopathy is seen. Reproductive: The bladder is decompressed. Vague soft  tissue inflammation about the bladder may be reactive in nature, or could reflect mild cystitis. A Foley catheter is noted in expected position. The prostate is borderline enlarged, measuring 4.9 cm in transverse dimension. Other: Small to moderate bilateral inguinal hernias are noted, containing only fat. Musculoskeletal: No acute osseous abnormalities are identified. The visualized musculature is unremarkable in appearance. Review of the MIP images confirms the above findings. IMPRESSION: 1. Bowel perforation, with a large amount of free air and free fluid noted throughout the abdomen and pelvis. Tiny focus of air along the pylorus and proximal duodenum raises question for the site of perforation. This was better characterized on subsequent surgery. 2. Vague mild soft tissue inflammation about the proximal ileum at the left mid abdomen. 3. No evidence of aortic dissection. No evidence of aneurysmal dilatation. Scattered aortic atherosclerosis. 4. No evidence of central pulmonary embolus. 5. Vague soft tissue inflammation about the bladder may be reactive in nature, or could reflect mild cystitis. 6. Cardiomegaly.  Scattered coronary artery calcification noted. 7. Trace right-sided pleural fluid. Patchy bibasilar airspace opacities likely reflect atelectasis. 8. Scattered diverticulosis along the ascending colon, without evidence of diverticulitis. 9. Borderline enlarged prostate. 10. Small to moderate bilateral inguinal hernias, containing only fat. Critical Value/emergent results were discussed in person at the time of interpretation on 11/27/2016 at 2:35 am with the OR nursing staff prior to planned surgery, who verbally acknowledged these results. Electronically Signed   By: Roanna Raider M.D.   On: 11/27/2016 05:35      Ashleymarie Granderson M 11/28/2016  Patient ID: Jacob Pittman, male   DOB: 11-15-1945, 71 y.o.   MRN: [id number]

## 2016-11-28 NOTE — Progress Notes (Signed)
eLink Physician-Brief Progress Note Patient Name: Jacob Pittman DOB: 02-27-1946 MRN: [id number]   Date of Service  11/28/2016  HPI/Events of Note  Hypoblycemia - Blood glucose = 32. Not on Lantus. Only on Novolog SSI which he hasn't received.   eICU Interventions  Will order: 1. Increase D10W IV infusion to 100 mL/hour. 2. Decrease 0.9 NaCl to 20 mL/hour.      Intervention Category Major Interventions: Other: Intermediate Interventions: Other:  Jacob Pittman 11/28/2016, 4:04 PM

## 2016-11-28 NOTE — Progress Notes (Signed)
PULMONARY / CRITICAL CARE MEDICINE   Name: Jacob Pittman MRN: [id number] DOB: [birth year]    ADMISSION DATE:  11/26/2016 CONSULTATION DATE:  8/10  REFERRING MD:  Ezzard Standing    HISTORY OF PRESENT ILLNESS:   71 year old man with ischemic cardiomyopathy, diabetic hypertensive in poor health, admitted 8/9 with gastric ulcer perforation and peritonitis. Underwent emergent laparotomy with omental patch repair He was in [place] shock requiring epinephrine and Levophed drip  STUDIES:  AXR 8/10 > air fluid level noted, free air can not be excluded. CTA chest / abd / pelv 8/10 > pneumoperitoneum.  CULTURES: Blood 8/10 >  Wound 8/10 >   ANTIBIOTICS: Vanc 8/10 x 1. Zosyn 8/10 >   SIGNIFICANT EVENTS: 8/10 > admit.  LINES/TUBES: ETT 8/10 >  R IJ (double lumen) CVL 8/10 >  R brachial a.line 8/10 > out Foley catheter   SUBJECTIVE:  Remains critically ill, high grade febrile 105 Intubated , no sedation Poor UO  VITAL SIGNS: BP (!) 102/57   Pulse 86   Temp (!) 105.5 F (40.8 C) (Rectal)   Resp (!) 35   Ht 5\' 8"  (1.727 m)   Wt 214 lb 1.1 oz (97.1 kg)   SpO2 100%   BMI 32.55 kg/m   HEMODYNAMICS: CVP:  [14 mmHg] 14 mmHg  VENTILATOR SETTINGS: Vent Mode: PRVC FiO2 (%):  [40 %-50 %] 40 % Set Rate:  [25 bmp] 25 bmp Vt Set:  [550 mL] 550 mL PEEP:  [5 cmH20] 5 cmH20 Plateau Pressure:  [25 cmH20-27 cmH20] 27 cmH20  INTAKE / OUTPUT: I/O last 3 completed shifts: In: 10568.3 [I.V.:5938.3; Other:230; IV Piggyback:4400] Out: 855 [Urine:585; Emesis/NG output:50; Drains:120; Blood:100]  PHYSICAL EXAMINATION: General:  Elderly, well built Neuro:  Unresponsive, off sedation, does not localise HEENT:  No JVD, no pallor, icterus Cardiovascular:  s1s2 regular Lungs:  Decreased BS BL Abdomen:  Distended, non tender, JP drain Musculoskeletal:  No edema Skin:  No breakdown or bruising Absent pulse both DP/PT  LABS:  BMET  Recent Labs Lab 11/27/16 0740 11/27/16 1401  11/28/16 0200  NA 138 138 135  K 2.7* 4.2 4.0  CL 104 105 96*  CO2 19* 18* 21*  BUN 43* 46* 52*  CREATININE 2.05* 2.46* 2.81*  GLUCOSE 104* 147* 137*    Electrolytes  Recent Labs Lab 11/27/16 0530 11/27/16 0740 11/27/16 1401 11/28/16 0200  CALCIUM 7.6* 7.9* 7.5* 7.0*  MG 1.3*  --  2.6* 2.3  PHOS 4.0  --   --  3.5    CBC  Recent Labs Lab 11/27/16 0530 11/27/16 0740 11/28/16 0200  WBC 2.4* 3.7* 9.8  HGB 13.2 14.4 13.6  HCT 37.7* 41.5 39.0  PLT 161 184 124*    Coag's  Recent Labs Lab 11/26/16 2315  INR 1.23    Sepsis Markers  Recent Labs Lab 11/26/16 2342 11/27/16 0520 11/27/16 0530 11/27/16 0740  LATICACIDVEN 7.5* 3.0*  --  3.8*  PROCALCITON  --   --  60.44  --     ABG  Recent Labs Lab 11/27/16 0558 11/27/16 1010 11/28/16 0315  PHART 7.240* 7.230* 7.454*  PCO2ART 47.6 44.7 27.9*  PO2ART 78.0* 123* 110*    Liver Enzymes  Recent Labs Lab 11/26/16 1553 11/27/16 0530 11/27/16 0740  AST 36 62* 80*  ALT 20 27 37  ALKPHOS 86 36* 38  BILITOT 3.0* 2.0* 2.6*  ALBUMIN 4.2 2.8* 3.0*    Cardiac Enzymes  Recent Labs Lab 11/27/16 1708 11/27/16 2042 11/28/16 0139  TROPONINI 0.11*  0.14* 0.17*    Glucose  Recent Labs Lab 11/27/16 1538 11/27/16 1945 11/27/16 2312 11/27/16 2356 11/28/16 0334 11/28/16 0736  GLUCAP 117* 87 61* 146* 137* 54*    Imaging No results found.   DISCUSSION: 71 y.o. M admitted 8/9 with pneumoperitoneum due to perforated pyloric ulcer, p-lap with multiorgan failure, renal function worse  ASSESSMENT / PLAN:  PULMONARY 1. Acute hypoxic Respiratory failure  Vent setting reviewed & adjusted RR increased Hold off SBts until awake  CARDIOVASCULAR 1. Hx PAD, sCHF with NICM (s/p ICD placement 2014, echo from Feb 2016 with EF 15%, G2DD), HTN, HLD. NSTEMI -  Continue preadmission ASA, rosuvastatin. - Hold preadmission carvedilol, furosemide, metolazone, spironolactone. -IV heparin if/ when OK with  surgery -Send co-ox x 1 & consider dobutamine if low  RENAL 1. Hyponatremia: - monitor  2. Hypokalemia: -  repleted   3. AKI;  baseline of 1.07 , Acute metabolic acidosis - dc bicarb gtt - NS @ 50/h - Foley and monitor UOP closely  GASTROINTESTINAL  Perforated pyloric channel ulcer - s/p repair 8/10 (Dr. Ezzard Standing). - PPI 40mg  IV BID - NPO - NG tube to low-intermittent suction - Post-op care per surgery  HEMATOLOGIC 1. VTE prophylaxis. - SCD's   - dc lovenox (since cr rising ) & use SQ Heparin instead - CBC in AM.  INFECTIOUS 1. Septic shock due to Peritonitis - due to perforated pyloric ulcer with 2400cc gastric contents in abdominal cavity  - Continue levophed as needed for goal MAP > 65; Off Epi , add vaso -Add stress dose steroids - continue Zosyn and Fluconazole - Follow cultures.  ENDOCRINE 1. DM: now hypoglycemic - NPO; SSI - ADd D10 @ 50/h - Hold preadmission metformin.  NEUROLOGIC 1. Acute encephalopathy - Off sedation. Use fent prn only, no drips RASS goal: 0  If does not wake up, consider head CT in 24h    My cc time x 4m  Cyril Mourning MD. FCCP. [place] [facility] Pager [phone] If no response call [phone]   11/28/2016, 8:27 AM

## 2016-11-28 NOTE — Progress Notes (Signed)
0745 rectal temp was 105.5.  Put Ice packs under armpits, crouch, neck, and legs.  Informed Dr. Abbey Chatters and 650 tylenol suppository ordered and administered.  Erick Blinks, RN

## 2016-11-28 NOTE — Progress Notes (Signed)
At 1542 pt had cbg of 32.  This RN administered 1 amp of D50 and informed Dr. Arsenio Loader at [facility].  D10 was increased from 76ml/hr to 181ml/hr.  Rechecked cbg and was 110.  Erick Blinks, RN

## 2016-11-29 ENCOUNTER — Inpatient Hospital Stay (HOSPITAL_COMMUNITY): Payer: Medicare PPO

## 2016-11-29 DIAGNOSIS — K265 Chronic or unspecified duodenal ulcer with perforation: Secondary | ICD-10-CM

## 2016-11-29 DIAGNOSIS — I351 Nonrheumatic aortic (valve) insufficiency: Secondary | ICD-10-CM

## 2016-11-29 DIAGNOSIS — I340 Nonrheumatic mitral (valve) insufficiency: Secondary | ICD-10-CM

## 2016-11-29 DIAGNOSIS — J9600 Acute respiratory failure, unspecified whether with hypoxia or hypercapnia: Secondary | ICD-10-CM

## 2016-11-29 LAB — BASIC METABOLIC PANEL
Anion gap: 22 — ABNORMAL HIGH (ref 5–15)
BUN: 62 mg/dL — AB (ref 6–20)
CHLORIDE: 93 mmol/L — AB (ref 101–111)
CO2: 17 mmol/L — ABNORMAL LOW (ref 22–32)
CREATININE: 2.74 mg/dL — AB (ref 0.61–1.24)
Calcium: 6.5 mg/dL — ABNORMAL LOW (ref 8.9–10.3)
GFR, EST AFRICAN AMERICAN: 25 mL/min — AB (ref >60)
GFR, EST NON AFRICAN AMERICAN: 21 mL/min — AB (ref >60)
Glucose, Bld: 176 mg/dL — ABNORMAL HIGH (ref 65–99)
POTASSIUM: 3.6 mmol/L (ref 3.5–5.1)
SODIUM: 132 mmol/L — AB (ref 135–145)

## 2016-11-29 LAB — URINE CULTURE: Culture: >=100000 — AB

## 2016-11-29 LAB — BLOOD GAS, ARTERIAL
ACID-BASE DEFICIT: 1.3 mmol/L (ref 0.0–2.0)
Bicarbonate: 21.1 mmol/L (ref 20.0–28.0)
Drawn by: 225631
FIO2: 30
LHR: 25 {breaths}/min
O2 Saturation: 98.1 %
PEEP: 5 cmH2O
PH ART: 7.452 — AB (ref 7.350–7.450)
Patient temperature: 100.2
VT: 0.55 mL
pCO2 arterial: 31 mmHg — ABNORMAL LOW (ref 32.0–48.0)
pO2, Arterial: 117 mmHg — ABNORMAL HIGH (ref 83.0–108.0)

## 2016-11-29 LAB — GLUCOSE, CAPILLARY
GLUCOSE-CAPILLARY: 117 mg/dL — AB (ref 65–99)
GLUCOSE-CAPILLARY: 168 mg/dL — AB (ref 65–99)
GLUCOSE-CAPILLARY: 222 mg/dL — AB (ref 65–99)
Glucose-Capillary: 136 mg/dL — ABNORMAL HIGH (ref 65–99)
Glucose-Capillary: 271 mg/dL — ABNORMAL HIGH (ref 65–99)
Glucose-Capillary: 41 mg/dL — CL (ref 65–99)
Glucose-Capillary: 190 mg/dL — ABNORMAL HIGH (ref 65–99)
Glucose-Capillary: 128 mg/dL — ABNORMAL HIGH (ref 65–99)
Glucose-Capillary: 126 mg/dL — ABNORMAL HIGH (ref 65–99)
Glucose-Capillary: 53 mg/dL — ABNORMAL LOW (ref 65–99)

## 2016-11-29 LAB — HEPATIC FUNCTION PANEL
ALBUMIN: 2.2 g/dL — AB (ref 3.5–5.0)
ALK PHOS: 55 U/L (ref 38–126)
ALT: 35 U/L (ref 17–63)
AST: 55 U/L — ABNORMAL HIGH (ref 15–41)
BILIRUBIN INDIRECT: 0.7 mg/dL (ref 0.3–0.9)
Bilirubin, Direct: 1.2 mg/dL — ABNORMAL HIGH (ref 0.1–0.5)
TOTAL PROTEIN: 5 g/dL — AB (ref 6.5–8.1)
Total Bilirubin: 1.9 mg/dL — ABNORMAL HIGH (ref 0.3–1.2)

## 2016-11-29 LAB — CBC
HCT: 34.5 % — ABNORMAL LOW (ref 39.0–52.0)
Hemoglobin: 12 g/dL — ABNORMAL LOW (ref 13.0–17.0)
MCH: 34.3 pg — AB (ref 26.0–34.0)
MCHC: 34.8 g/dL (ref 30.0–36.0)
MCV: 98.6 fL (ref 78.0–100.0)
PLATELETS: 81 10*3/uL — AB (ref 150–400)
RBC: 3.5 MIL/uL — ABNORMAL LOW (ref 4.22–5.81)
RDW: 14 % (ref 11.5–15.5)
WBC: 15.6 10*3/uL — ABNORMAL HIGH (ref 4.0–10.5)

## 2016-11-29 LAB — ECHOCARDIOGRAM COMPLETE
HEIGHTINCHES: 68 in
Weight: 3477.98 oz

## 2016-11-29 LAB — MAGNESIUM: MAGNESIUM: 2.3 mg/dL (ref 1.7–2.4)

## 2016-11-29 LAB — PHOSPHORUS: Phosphorus: 3.3 mg/dL (ref 2.5–4.6)

## 2016-11-29 LAB — AEROBIC CULTURE  (SUPERFICIAL SPECIMEN)

## 2016-11-29 LAB — AEROBIC CULTURE W GRAM STAIN (SUPERFICIAL SPECIMEN): Culture: NORMAL

## 2016-11-29 MED ORDER — ACETAMINOPHEN 650 MG RE SUPP
650.0000 mg | RECTAL | Status: DC | PRN
  Administered 2016-11-29 (×2): 650 mg via RECTAL
  Filled 2016-11-29 (×2): qty 1

## 2016-11-29 MED ORDER — ACETAMINOPHEN 325 MG PO TABS: 325.0000 mg | ORAL_TABLET | Freq: Four times a day (QID) | ORAL | Status: DC | PRN

## 2016-11-29 MED ORDER — POTASSIUM CHLORIDE 10 MEQ/50ML IV SOLN
10.0000 meq | INTRAVENOUS | Status: AC
  Administered 2016-11-29 (×4): 10 meq via INTRAVENOUS
  Filled 2016-11-29 (×4): qty 50

## 2016-11-29 MED ORDER — DEXTROSE 50 % IV SOLN
INTRAVENOUS | Status: AC
  Administered 2016-11-29: 25 mL via INTRAVENOUS
  Filled 2016-11-29: qty 50

## 2016-11-29 MED ORDER — DEXTROSE 50 % IV SOLN
INTRAVENOUS | Status: AC
  Administered 2016-11-29: 50 mL
  Filled 2016-11-29: qty 50

## 2016-11-29 MED ORDER — DEXTROSE 50 % IV SOLN
25.0000 mL | Freq: Once | INTRAVENOUS | Status: AC
  Administered 2016-11-29: 25 mL via INTRAVENOUS

## 2016-11-29 MED ORDER — HYDROCORTISONE NA SUCCINATE PF 100 MG IJ SOLR
50.0000 mg | Freq: Two times a day (BID) | INTRAMUSCULAR | Status: DC
  Administered 2016-11-29 – 2016-12-01 (×4): 50 mg via INTRAVENOUS
  Filled 2016-11-29 (×4): qty 2

## 2016-11-29 NOTE — Progress Notes (Signed)
General Surgery [facility]  Assessment & Plan: POD#3 - status post Jacob Pittman patch repair of perforated pyloric channel ulcer  Temp curve improved  Abx - Vanco, Zosyn, Diflucan  Pressors off this AM  Vent per CCM  Keep NPO and no TF's until UGI study this week        Velora Heckler, MD, [facility]       Office: [phone]    Chief Complaint: Perforated pyloric channel ulcer  Subjective: Patient more responsive this AM, less agitated.  Objective: Vital signs in last 24 hours: Temp:  [98.4 F (36.9 C)-104.2 F (40.1 C)] 100.6 F (38.1 C) (08/12 0600) Pulse Rate:  [77] 77 (08/11 1230) Resp:  [21-37] 34 (08/12 0600) BP: (91-166)/(50-118) 113/86 (08/12 0600) SpO2:  [89 %-100 %] 100 % (08/12 0600) FiO2 (%):  [30 %-40 %] 30 % (08/12 0410) Weight:  [98.6 kg (217 lb 6 oz)] 98.6 kg (217 lb 6 oz) (08/12 0400)    Intake/Output from previous day: 08/11 0701 - 08/12 0700 In: 2997 [I.V.:2947; IV Piggyback:50] Out: 1445 [Urine:1110; Emesis/NG output:280; Drains:55] Intake/Output this shift: No intake/output data recorded.  Physical Exam: HEENT - sclerae clear, mucous membranes moist Neck - soft Chest - clear bilaterally Cor - RRR Abdomen - distended, quiet; dressings intact; JP with yellow serous output; NG bilious  Lab Results:   Recent Labs  11/27/16 0740 11/28/16 0200  WBC 3.7* 9.8  HGB 14.4 13.6  HCT 41.5 39.0  PLT 184 124*   BMET  Recent Labs  11/27/16 1401 11/28/16 0200  NA 138 135  K 4.2 4.0  CL 105 96*  CO2 18* 21*  GLUCOSE 147* 137*  BUN 46* 52*  CREATININE 2.46* 2.81*  CALCIUM 7.5* 7.0*   PT/INR  Recent Labs  11/26/16 2315  LABPROT 15.6*  INR 1.23   Comprehensive Metabolic Panel:    Component Value Date/Time   NA 135 11/28/2016 0200   NA 138 11/27/2016 1401   K 4.0 11/28/2016 0200   K 4.2 11/27/2016 1401   CL 96 (L) 11/28/2016 0200   CL 105 11/27/2016 1401   CO2 21 (L)  11/28/2016 0200   CO2 18 (L) 11/27/2016 1401   BUN 52 (H) 11/28/2016 0200   BUN 46 (H) 11/27/2016 1401   CREATININE 2.81 (H) 11/28/2016 0200   CREATININE 2.46 (H) 11/27/2016 1401   CREATININE 0.91 03/04/2015 1618   CREATININE 1.25 (H) 01/01/2015 1012   GLUCOSE 137 (H) 11/28/2016 0200   GLUCOSE 147 (H) 11/27/2016 1401   CALCIUM 7.0 (L) 11/28/2016 0200   CALCIUM 7.5 (L) 11/27/2016 1401   AST 80 (H) 11/27/2016 0740   AST 62 (H) 11/27/2016 0530   ALT 37 11/27/2016 0740   ALT 27 11/27/2016 0530   ALKPHOS 38 11/27/2016 0740   ALKPHOS 36 (L) 11/27/2016 0530   BILITOT 2.6 (H) 11/27/2016 0740   BILITOT 2.0 (H) 11/27/2016 0530   PROT 5.2 (L) 11/27/2016 0740   PROT 4.9 (L) 11/27/2016 0530   ALBUMIN 3.0 (L) 11/27/2016 0740   ALBUMIN 2.8 (L) 11/27/2016 0530    Studies/Results: Dg Chest Port 1 View  Result Date: 11/29/2016 CLINICAL DATA:  Acute respiratory failure EXAM: PORTABLE CHEST 1 VIEW COMPARISON:  11/27/2016 FINDINGS: Cardiac shadow remains enlarged. Defibrillator is again seen. Endotracheal tube, nasogastric catheter and right jugular central line are again seen and stable. The overall inspiratory effort is poor. No focal confluent infiltrate is  seen. No acute bony abnormality is noted. Improvement in the vascular congestion interstitial edema is noted. IMPRESSION: Poor inspiratory effort with overall decrease in vascular congestion. Tubes and lines as described. Electronically Signed   By: Alcide Clever M.D.   On: 11/29/2016 07:04      [name] M 11/29/2016  Patient ID: Jacob Pittman, male   DOB: Jun 24, 1945, 71 y.o.   MRN: [id number]

## 2016-11-29 NOTE — Progress Notes (Signed)
eLink Physician-Brief Progress Note Patient Name: Jacob Pittman DOB: 1945/11/07 MRN: [id number]   Date of Service  11/29/2016  HPI/Events of Note  Alcoholic with poor liver function  eICU Interventions  Tylenol discontinued     Intervention Category Major Interventions: Other:  YACOUB,WESAM 11/29/2016, 6:11 AM

## 2016-11-29 NOTE — Progress Notes (Signed)
  Echocardiogram 2D Echocardiogram has been performed.  Taia Bramlett L Androw 11/29/2016, 9:47 AM

## 2016-11-29 NOTE — Progress Notes (Signed)
CRITICAL VALUE ALERT  Critical Value:  Potassium 2.7 and Glucose 659  Date & Time Notied:  11/29/2016  0930  Provider Notified: Dr. Vassie Loll  Orders Received/Actions taken: reduce D10 and added iv potassium runs

## 2016-11-29 NOTE — Progress Notes (Signed)
PULMONARY / CRITICAL CARE MEDICINE   Name: Jacob Pittman MRN: [id number] DOB: 1945-06-26    ADMISSION DATE:  11/26/2016 CONSULTATION DATE:  8/10  REFERRING MD:  Ezzard Standing    HISTORY OF PRESENT ILLNESS:   71 year old man with ischemic cardiomyopathy, diabetic hypertensive in poor health, admitted 8/9 with gastric ulcer perforation and peritonitis. Underwent emergent laparotomy with omental patch repair He was in Florid shock requiring epinephrine and Levophed drip  STUDIES:  AXR 8/10 > air fluid level noted, free air can not be excluded. CTA chest / abd / pelv 8/10 > pneumoperitoneum.  CULTURES: BC 8/9 >> ng Blood 8/10 > ng Wound 8/10 >  Urine 8/9 >> e coli   ANTIBIOTICS: Vanc 8/10 x 1. Zosyn 8/10 >   SIGNIFICANT EVENTS: 8/10 > admit. 8/11 high gr fever 105  LINES/TUBES: ETT 8/10 >  R IJ (double lumen) CVL 8/10 >  R brachial a.line 8/10 > out Foley catheter   SUBJECTIVE:  Remains critically ill,  Intubated Off pressors defervesced Sugars fluctuating On no sedation  UO picked up  VITAL SIGNS: BP 115/61   Pulse 77   Temp 100 F (37.8 C)   Resp (!) 26   Ht 5\' 8"  (1.727 m)   Wt 217 lb 6 oz (98.6 kg)   SpO2 97%   BMI 33.05 kg/m   HEMODYNAMICS:    VENTILATOR SETTINGS: Vent Mode: PRVC FiO2 (%):  [30 %-40 %] 30 % Set Rate:  [25 bmp] 25 bmp Vt Set:  [550 mL] 550 mL PEEP:  [5 cmH20] 5 cmH20 Plateau Pressure:  [19 cmH20-25 cmH20] 19 cmH20  INTAKE / OUTPUT: I/O last 3 completed shifts: In: 4992.1 [I.V.:4842.1; IV Piggyback:150] Out: 2670 [Urine:1360; Emesis/NG output:1230; Drains:80]  PHYSICAL EXAMINATION: General:  Elderly, well built Neuro:  Follows commands, mouths words, non focal HEENT:  No JVD, no pallor, icterus Cardiovascular:  s1s2 regular Lungs:  Decreased BS BL Abdomen:  Distended, non tender, JP drain Musculoskeletal:  No edema Skin:  No breakdown or bruising Absent pulse both DP/PT  LABS:  BMET  Recent Labs Lab  11/27/16 1401 11/28/16 0200 11/29/16 0620  NA 138 135 130*  K 4.2 4.0 2.7*  CL 105 96* 91*  CO2 18* 21* 23  BUN 46* 52* 56*  CREATININE 2.46* 2.81* 2.32*  GLUCOSE 147* 137* 659*    Electrolytes  Recent Labs Lab 11/27/16 0530  11/27/16 1401 11/28/16 0200 11/29/16 0620  CALCIUM 7.6*  < > 7.5* 7.0* 6.6*  MG 1.3*  --  2.6* 2.3 2.3  PHOS 4.0  --   --  3.5 3.3  < > = values in this interval not displayed.  CBC  Recent Labs Lab 11/27/16 0740 11/28/16 0200 11/29/16 0620  WBC 3.7* 9.8 15.6*  HGB 14.4 13.6 12.0*  HCT 41.5 39.0 34.5*  PLT 184 124* 81*    Coag's  Recent Labs Lab 11/26/16 2315  INR 1.23    Sepsis Markers  Recent Labs Lab 11/26/16 2342 11/27/16 0520 11/27/16 0530 11/27/16 0740  LATICACIDVEN 7.5* 3.0*  --  3.8*  PROCALCITON  --   --  60.44  --     ABG  Recent Labs Lab 11/27/16 1010 11/28/16 0315 11/29/16 0510  PHART 7.230* 7.454* 7.452*  PCO2ART 44.7 27.9* 31.0*  PO2ART 123* 110* 117*    Liver Enzymes  Recent Labs Lab 11/26/16 1553 11/27/16 0530 11/27/16 0740  AST 36 62* 80*  ALT 20 27 37  ALKPHOS 86 36* 38  BILITOT 3.0* 2.0*  2.6*  ALBUMIN 4.2 2.8* 3.0*    Cardiac Enzymes  Recent Labs Lab 11/27/16 2042 11/28/16 0139 11/28/16 1206  TROPONINI 0.14* 0.17* 0.17*    Glucose  Recent Labs Lab 11/28/16 1938 11/29/16 0003 11/29/16 0418 11/29/16 0736 11/29/16 0802 11/29/16 0858  GLUCAP 106* 95 136* 41* 271* 222*    Imaging Dg Chest Port 1 View  Result Date: 11/29/2016 CLINICAL DATA:  Acute respiratory failure EXAM: PORTABLE CHEST 1 VIEW COMPARISON:  11/27/2016 FINDINGS: Cardiac shadow remains enlarged. Defibrillator is again seen. Endotracheal tube, nasogastric catheter and right jugular central line are again seen and stable. The overall inspiratory effort is poor. No focal confluent infiltrate is seen. No acute bony abnormality is noted. Improvement in the vascular congestion interstitial edema is noted.  IMPRESSION: Poor inspiratory effort with overall decrease in vascular congestion. Tubes and lines as described. Electronically Signed   By: Alcide Clever M.D.   On: 11/29/2016 07:04     DISCUSSION: 71 y.o. M admitted 8/9 with pneumoperitoneum due to perforated pyloric ulcer, p-lap with multiorgan failure, renal function improving & came off pressors  - more optimistic for favorable outcome here  ASSESSMENT / PLAN:  PULMONARY 1. Acute hypoxic Respiratory failure  Vent setting reviewed & adjusted RR increased to 25 Start SBts   CARDIOVASCULAR 1. Hx PAD, sCHF with NICM (s/p ICD placement 2014, echo from Feb 2016 with EF 15%, G2DD), HTN, HLD. NSTEMI -peak Tp 0.17 -  Continue preadmission ASA, rosuvastatin. - Hold preadmission carvedilol, furosemide, metolazone, spironolactone. -doubt need for IV heparin although OK with surgery   RENAL 1. Hyponatremia: - improved  2. Hypokalemia: -  repleted x 4 runs  3. AKI;  baseline of 1.07 , Acute metabolic acidosis -  NS @ 50/h ,aim for equal balance - Foley and monitor UOP closely  GASTROINTESTINAL  Perforated pyloric channel ulcer - s/p repair 8/10 (Dr. Ezzard Standing). - PPI 40mg  IV BID - NPO - NG tube to low-intermittent suction - Post-op care per surgery ,hold off TFs until UGI per surgery   Elevated LFts - Limit tylenol , use cooling blanket for fever, recheck LFTs  HEMATOLOGIC 1. VTE prophylaxis. - SCD's   - SQ Heparin instead of lovenox due to renal fn - CBC in AM.  INFECTIOUS 1. Septic shock due to Peritonitis - due to perforated pyloric ulcer with 2400cc gastric contents in abdominal cavity  - off pressors -taper stress dose steroids - continue Zosyn and Fluconazole - Follow cultures.  ENDOCRINE 1. DM:  Hypoglycemic 8/11, fluctuating - NPO; SSI - ct D10 @ 50-75/h & titrate to keep CBgs> 100 - Hold preadmission metformin.  NEUROLOGIC 1. Acute encephalopathy - Off sedation. Use fent prn only, no  drips RASS goal: 0      My cc time x 24m  Cyril Mourning MD. FCCP. [facility] Pager [phone] If no response call [phone]   11/29/2016, 10:07 AM

## 2016-11-30 ENCOUNTER — Encounter (HOSPITAL_COMMUNITY): Payer: Self-pay | Admitting: Certified Registered Nurse Anesthetist

## 2016-11-30 ENCOUNTER — Encounter (HOSPITAL_COMMUNITY)
Admission: EM | Disposition: A | Discharge status: Nursing Home (Includes ICF) | Payer: Self-pay | Source: Home / Self Care | Attending: Pulmonary Disease

## 2016-11-30 ENCOUNTER — Inpatient Hospital Stay (HOSPITAL_COMMUNITY): Payer: Medicare PPO | Admitting: Certified Registered Nurse Anesthetist

## 2016-11-30 ENCOUNTER — Inpatient Hospital Stay (HOSPITAL_COMMUNITY): Admit: 2016-11-30 | Payer: Medicare PPO

## 2016-11-30 ENCOUNTER — Inpatient Hospital Stay (HOSPITAL_COMMUNITY): Payer: Medicare PPO

## 2016-11-30 DIAGNOSIS — I998 Other disorder of circulatory system: Secondary | ICD-10-CM

## 2016-11-30 DIAGNOSIS — I739 Peripheral vascular disease, unspecified: Secondary | ICD-10-CM

## 2016-11-30 DIAGNOSIS — I5022 Chronic systolic (congestive) heart failure: Secondary | ICD-10-CM

## 2016-11-30 HISTORY — PX: EMBOLECTOMY: SHX44

## 2016-11-30 LAB — TYPE AND SCREEN
ABO/RH(D): O POS
Antibody Screen: NEGATIVE
UNIT DIVISION: 0
Unit division: 0
Unit division: 0
Unit division: 0

## 2016-11-30 LAB — BASIC METABOLIC PANEL
Anion gap: 16 — ABNORMAL HIGH (ref 5–15)
Anion gap: 18 — ABNORMAL HIGH (ref 5–15)
BUN: 56 mg/dL — AB (ref 6–20)
BUN: 74 mg/dL — ABNORMAL HIGH (ref 6–20)
CALCIUM: 6.6 mg/dL — AB (ref 8.9–10.3)
CHLORIDE: 91 mmol/L — AB (ref 101–111)
CHLORIDE: 92 mmol/L — AB (ref 101–111)
CO2: 23 mmol/L (ref 22–32)
CO2: 22 mmol/L (ref 22–32)
CREATININE: 2.32 mg/dL — AB (ref 0.61–1.24)
CREATININE: 2.73 mg/dL — AB (ref 0.61–1.24)
Calcium: 6.6 mg/dL — ABNORMAL LOW (ref 8.9–10.3)
GFR calc Af Amer: 30 mL/min — ABNORMAL LOW (ref >60)
GFR calc non Af Amer: 22 mL/min — ABNORMAL LOW (ref >60)
GFR, EST AFRICAN AMERICAN: 25 mL/min — AB (ref >60)
GFR, EST NON AFRICAN AMERICAN: 26 mL/min — AB (ref >60)
GLUCOSE: 169 mg/dL — AB (ref 65–99)
Glucose, Bld: 659 mg/dL (ref 65–99)
Potassium: 2.7 mmol/L — CL (ref 3.5–5.1)
Potassium: 3.1 mmol/L — ABNORMAL LOW (ref 3.5–5.1)
SODIUM: 130 mmol/L — AB (ref 135–145)
Sodium: 132 mmol/L — ABNORMAL LOW (ref 135–145)

## 2016-11-30 LAB — BLOOD GAS, ARTERIAL
ACID-BASE DEFICIT: 9.1 mmol/L — AB (ref 0.0–2.0)
Bicarbonate: 18 mmol/L — ABNORMAL LOW (ref 20.0–28.0)
DRAWN BY: 331471
FIO2: 50
LHR: 20 {breaths}/min
MECHVT: 550 mL
O2 SAT: 97.3 %
PATIENT TEMPERATURE: 98.6
PCO2 ART: 44.7 mmHg (ref 32.0–48.0)
PEEP/CPAP: 5 cmH2O
PH ART: 7.23 — AB (ref 7.350–7.450)
PO2 ART: 123 mmHg — AB (ref 83.0–108.0)

## 2016-11-30 LAB — BPAM RBC
BLOOD PRODUCT EXPIRATION DATE: 201809072359
BLOOD PRODUCT EXPIRATION DATE: 201809122359
Blood Product Expiration Date: 201809062359
Blood Product Expiration Date: 201809122359
ISSUE DATE / TIME: 201808100340
ISSUE DATE / TIME: 201808100340
ISSUE DATE / TIME: 201808100340
ISSUE DATE / TIME: 201808102050
UNIT TYPE AND RH: 5100
UNIT TYPE AND RH: 5100
UNIT TYPE AND RH: 5100
Unit Type and Rh: 5100

## 2016-11-30 LAB — COMPREHENSIVE METABOLIC PANEL
ALBUMIN: 1.1 g/dL — AB (ref 3.5–5.0)
ALT: 41 U/L (ref 17–63)
AST: 74 U/L — AB (ref 15–41)
Alkaline Phosphatase: 29 U/L — ABNORMAL LOW (ref 38–126)
Anion gap: 16 — ABNORMAL HIGH (ref 5–15)
BUN: 77 mg/dL — AB (ref 6–20)
CHLORIDE: 98 mmol/L — AB (ref 101–111)
CO2: 18 mmol/L — AB (ref 22–32)
CREATININE: 2.98 mg/dL — AB (ref 0.61–1.24)
Calcium: 5.7 mg/dL — CL (ref 8.9–10.3)
GFR calc Af Amer: 22 mL/min — ABNORMAL LOW (ref >60)
GFR, EST NON AFRICAN AMERICAN: 19 mL/min — AB (ref >60)
GLUCOSE: 109 mg/dL — AB (ref 65–99)
POTASSIUM: 3.9 mmol/L (ref 3.5–5.1)
SODIUM: 132 mmol/L — AB (ref 135–145)
Total Bilirubin: 1.5 mg/dL — ABNORMAL HIGH (ref 0.3–1.2)
Total Protein: <3 g/dL — ABNORMAL LOW (ref 6.5–8.1)

## 2016-11-30 LAB — CBC
HEMATOCRIT: 33.8 % — AB (ref 39.0–52.0)
HEMOGLOBIN: 12 g/dL — AB (ref 13.0–17.0)
MCH: 34.2 pg — ABNORMAL HIGH (ref 26.0–34.0)
MCHC: 35.5 g/dL (ref 30.0–36.0)
MCV: 96.3 fL (ref 78.0–100.0)
Platelets: 80 10*3/uL — ABNORMAL LOW (ref 150–400)
RBC: 3.51 MIL/uL — ABNORMAL LOW (ref 4.22–5.81)
RDW: 14.1 % (ref 11.5–15.5)
WBC: 15.9 10*3/uL — ABNORMAL HIGH (ref 4.0–10.5)

## 2016-11-30 LAB — PROTIME-INR
INR: 1.35
Prothrombin Time: 16.8 seconds — ABNORMAL HIGH (ref 11.4–15.2)

## 2016-11-30 LAB — GLUCOSE, CAPILLARY
GLUCOSE-CAPILLARY: 85 mg/dL (ref 65–99)
GLUCOSE-CAPILLARY: 139 mg/dL — AB (ref 65–99)
GLUCOSE-CAPILLARY: 126 mg/dL — AB (ref 65–99)
GLUCOSE-CAPILLARY: 80 mg/dL (ref 65–99)
Glucose-Capillary: 152 mg/dL — ABNORMAL HIGH (ref 65–99)

## 2016-11-30 LAB — HEPARIN LEVEL (UNFRACTIONATED): Heparin Unfractionated: 0.23 IU/mL — ABNORMAL LOW (ref 0.30–0.70)

## 2016-11-30 LAB — APTT: aPTT: 36 seconds (ref 24–36)

## 2016-11-30 LAB — H. PYLORI ANTIBODY, IGG: H Pylori IgG: 5 Index Value — ABNORMAL HIGH (ref 0.00–0.79)

## 2016-11-30 LAB — PHOSPHORUS: PHOSPHORUS: 5.7 mg/dL — AB (ref 2.5–4.6)

## 2016-11-30 LAB — MAGNESIUM: Magnesium: 2.5 mg/dL — ABNORMAL HIGH (ref 1.7–2.4)

## 2016-11-30 SURGERY — EMBOLECTOMY
Anesthesia: General | Site: Arm Upper | Laterality: Right

## 2016-11-30 MED ORDER — PHENYLEPHRINE HCL 10 MG/ML IJ SOLN
INTRAVENOUS | Status: DC | PRN
  Administered 2016-11-30: 50 ug/min via INTRAVENOUS

## 2016-11-30 MED ORDER — VASOPRESSIN 20 UNIT/ML IV SOLN
INTRAVENOUS | Status: AC
Start: 2016-11-30 — End: 2016-11-30
  Filled 2016-11-30: qty 1

## 2016-11-30 MED ORDER — POTASSIUM CHLORIDE 10 MEQ/50ML IV SOLN
10.0000 meq | INTRAVENOUS | Status: AC
  Administered 2016-11-30 (×5): 10 meq via INTRAVENOUS
  Filled 2016-11-30 (×15): qty 50

## 2016-11-30 MED ORDER — ROCURONIUM BROMIDE 100 MG/10ML IV SOLN
INTRAVENOUS | Status: DC | PRN
  Administered 2016-11-30: 20 mg via INTRAVENOUS
  Administered 2016-11-30: 15 mg via INTRAVENOUS
  Administered 2016-11-30: 50 mg via INTRAVENOUS

## 2016-11-30 MED ORDER — POTASSIUM CHLORIDE 20 MEQ/15ML (10%) PO SOLN
20.0000 meq | Freq: Once | ORAL | Status: AC
  Administered 2016-12-01: 20 meq
  Filled 2016-11-30: qty 15

## 2016-11-30 MED ORDER — FENTANYL CITRATE (PF) 100 MCG/2ML IJ SOLN: 25.0000 ug | INTRAMUSCULAR | Status: DC | PRN

## 2016-11-30 MED ORDER — HEPARIN (PORCINE) IN NACL 100-0.45 UNIT/ML-% IJ SOLN
1300.0000 [IU]/h | INTRAMUSCULAR | Status: DC
  Administered 2016-11-30: 1300 [IU]/h via INTRAVENOUS
  Filled 2016-11-30: qty 250

## 2016-11-30 MED ORDER — NOREPINEPHRINE BITARTRATE 1 MG/ML IV SOLN
0.0000 ug/min | INTRAVENOUS | Status: DC
  Administered 2016-12-02: 2 ug/min via INTRAVENOUS
  Administered 2016-12-02: 14 ug/min via INTRAVENOUS
  Administered 2016-12-03: 16 ug/min via INTRAVENOUS
  Administered 2016-12-03: 22 ug/min via INTRAVENOUS
  Filled 2016-11-30 (×4): qty 4

## 2016-11-30 MED ORDER — PHENYLEPHRINE HCL 10 MG/ML IJ SOLN
INTRAMUSCULAR | Status: DC | PRN
  Administered 2016-11-30 (×2): 80 ug via INTRAVENOUS

## 2016-11-30 MED ORDER — POTASSIUM CHLORIDE 10 MEQ/100ML IV SOLN
10.0000 meq | Freq: Once | INTRAVENOUS | Status: DC
  Administered 2016-11-30: 10 meq via INTRAVENOUS
  Filled 2016-11-30: qty 100

## 2016-11-30 MED ORDER — FENTANYL CITRATE (PF) 100 MCG/2ML IJ SOLN
INTRAMUSCULAR | Status: DC | PRN
  Administered 2016-11-30: 50 ug via INTRAVENOUS

## 2016-11-30 MED ORDER — NOREPINEPHRINE BITARTRATE 1 MG/ML IV SOLN
INTRAVENOUS | Status: DC | PRN
  Administered 2016-11-30: 2 ug/min via INTRAVENOUS

## 2016-11-30 MED ORDER — FAT EMULSION 20 % IV EMUL
240.0000 mL | INTRAVENOUS | Status: DC
  Filled 2016-11-30: qty 250

## 2016-11-30 MED ORDER — PROPOFOL 10 MG/ML IV BOLUS
INTRAVENOUS | Status: AC
  Filled 2016-11-30: qty 20

## 2016-11-30 MED ORDER — NOREPINEPHRINE BITARTRATE 1 MG/ML IV SOLN
0.0000 ug/min | INTRAVENOUS | Status: AC
  Administered 2016-11-30: 15 ug/min via INTRAVENOUS
  Filled 2016-11-30 (×2): qty 4

## 2016-11-30 MED ORDER — FENTANYL CITRATE (PF) 250 MCG/5ML IJ SOLN
INTRAMUSCULAR | Status: AC
  Filled 2016-11-30: qty 5

## 2016-11-30 MED ORDER — HEPARIN (PORCINE) IN NACL 100-0.45 UNIT/ML-% IJ SOLN
1300.0000 [IU]/h | INTRAMUSCULAR | Status: DC
  Administered 2016-11-30 (×2): 1300 [IU]/h via INTRAVENOUS
  Filled 2016-11-30: qty 250

## 2016-11-30 MED ORDER — LACTATED RINGERS IV SOLN
INTRAVENOUS | Status: DC | PRN
  Administered 2016-11-30: 15:00:00 via INTRAVENOUS

## 2016-11-30 MED ORDER — MIDAZOLAM HCL 5 MG/5ML IJ SOLN
INTRAMUSCULAR | Status: DC | PRN
  Administered 2016-11-30 (×2): 1 mg via INTRAVENOUS

## 2016-11-30 MED ORDER — M.V.I. ADULT IV INJ
INJECTION | INTRAVENOUS | Status: AC
  Administered 2016-11-30: 20:00:00 via INTRAVENOUS
  Filled 2016-11-30 (×3): qty 960

## 2016-11-30 MED ORDER — 0.9 % SODIUM CHLORIDE (POUR BTL) OPTIME
TOPICAL | Status: DC | PRN
  Administered 2016-11-30: 1000 mL

## 2016-11-30 MED ORDER — HEMOSTATIC AGENTS (NO CHARGE) OPTIME
TOPICAL | Status: DC | PRN
  Administered 2016-11-30: 1 via TOPICAL

## 2016-11-30 MED ORDER — HEPARIN SODIUM (PORCINE) 1000 UNIT/ML IJ SOLN
INTRAMUSCULAR | Status: DC | PRN
  Administered 2016-11-30 (×2): 5000 [IU] via INTRAVENOUS

## 2016-11-30 MED ORDER — EPHEDRINE SULFATE 50 MG/ML IJ SOLN
INTRAMUSCULAR | Status: DC | PRN
  Administered 2016-11-30 (×2): 10 mg via INTRAVENOUS

## 2016-11-30 MED ORDER — SODIUM CHLORIDE 0.9 % IV SOLN
INTRAVENOUS | Status: DC | PRN
  Administered 2016-11-30: 16:00:00

## 2016-11-30 MED ORDER — MIDAZOLAM HCL 2 MG/2ML IJ SOLN
INTRAMUSCULAR | Status: AC
  Filled 2016-11-30: qty 2

## 2016-11-30 MED ORDER — ONDANSETRON HCL 4 MG/2ML IJ SOLN
INTRAMUSCULAR | Status: AC
  Filled 2016-11-30: qty 2

## 2016-11-30 MED ORDER — PHENYLEPHRINE 40 MCG/ML (10ML) SYRINGE FOR IV PUSH (FOR BLOOD PRESSURE SUPPORT)
PREFILLED_SYRINGE | INTRAVENOUS | Status: AC
  Filled 2016-11-30: qty 10

## 2016-11-30 MED ORDER — FUROSEMIDE 10 MG/ML IJ SOLN
40.0000 mg | Freq: Once | INTRAMUSCULAR | Status: AC
  Administered 2016-11-30: 20 mg via INTRAVENOUS
  Filled 2016-11-30: qty 4

## 2016-11-30 MED ORDER — POTASSIUM CHLORIDE 10 MEQ/50ML IV SOLN: 10.0000 meq | Freq: Once | INTRAVENOUS | Status: DC

## 2016-11-30 SURGICAL SUPPLY — 51 items
BANDAGE ESMARK 6X9 LF (GAUZE/BANDAGES/DRESSINGS) IMPLANT
BNDG ESMARK 6X9 LF (GAUZE/BANDAGES/DRESSINGS)
CANISTER SUCT 3000ML PPV (MISCELLANEOUS) ×2 IMPLANT
CATH EMB 3FR 80CM (CATHETERS) ×2 IMPLANT
CATH EMB 4FR 80CM (CATHETERS) ×2 IMPLANT
CATH EMB 5FR 80CM (CATHETERS) IMPLANT
CLIP VESOCCLUDE MED 24/CT (CLIP) ×2 IMPLANT
CLIP VESOCCLUDE SM WIDE 24/CT (CLIP) ×2 IMPLANT
CUFF TOURNIQUET SINGLE 24IN (TOURNIQUET CUFF) IMPLANT
CUFF TOURNIQUET SINGLE 34IN LL (TOURNIQUET CUFF) IMPLANT
CUFF TOURNIQUET SINGLE 44IN (TOURNIQUET CUFF) IMPLANT
DECANTER SPIKE VIAL GLASS SM (MISCELLANEOUS) IMPLANT
DERMABOND ADVANCED (GAUZE/BANDAGES/DRESSINGS) ×1
DERMABOND ADVANCED .7 DNX12 (GAUZE/BANDAGES/DRESSINGS) ×1 IMPLANT
DRAIN SNY 10X20 3/4 PERF (WOUND CARE) IMPLANT
DRAPE X-RAY CASS 24X20 (DRAPES) IMPLANT
DRSG COVADERM 4X8 (GAUZE/BANDAGES/DRESSINGS) ×2 IMPLANT
ELECT REM PT RETURN 9FT ADLT (ELECTROSURGICAL) ×2
ELECTRODE REM PT RTRN 9FT ADLT (ELECTROSURGICAL) ×1 IMPLANT
EVACUATOR SILICONE 100CC (DRAIN) IMPLANT
GLOVE BIO SURGEON STRL SZ7.5 (GLOVE) ×2 IMPLANT
GOWN STRL REUS W/ TWL LRG LVL3 (GOWN DISPOSABLE) ×4 IMPLANT
GOWN STRL REUS W/TWL LRG LVL3 (GOWN DISPOSABLE) ×4
HEMOSTAT SPONGE AVITENE ULTRA (HEMOSTASIS) ×2 IMPLANT
KIT BASIN OR (CUSTOM PROCEDURE TRAY) ×2 IMPLANT
KIT ROOM TURNOVER OR (KITS) ×2 IMPLANT
LOOP VESSEL MINI RED (MISCELLANEOUS) ×2 IMPLANT
NS IRRIG 1000ML POUR BTL (IV SOLUTION) ×4 IMPLANT
PACK CV ACCESS (CUSTOM PROCEDURE TRAY) ×2 IMPLANT
PACK PERIPHERAL VASCULAR (CUSTOM PROCEDURE TRAY) IMPLANT
PAD ARMBOARD 7.5X6 YLW CONV (MISCELLANEOUS) ×4 IMPLANT
SET COLLECT BLD 21X3/4 12 (NEEDLE) IMPLANT
SPONGE SURGIFOAM ABS GEL 100 (HEMOSTASIS) IMPLANT
STAPLER VISISTAT 35W (STAPLE) IMPLANT
STOPCOCK 4 WAY LG BORE MALE ST (IV SETS) IMPLANT
SUT PROLENE 5 0 C 1 24 (SUTURE) ×4 IMPLANT
SUT PROLENE 6 0 CC (SUTURE) ×2 IMPLANT
SUT PROLENE 6 0 CC 1 (SUTURE) ×2 IMPLANT
SUT PROLENE 7 0 BV 1 (SUTURE) ×6 IMPLANT
SUT SILK 3 0 (SUTURE) ×1
SUT SILK 3-0 18XBRD TIE 12 (SUTURE) ×1 IMPLANT
SUT VIC AB 2-0 CTX 36 (SUTURE) ×2 IMPLANT
SUT VIC AB 3-0 MH 27 (SUTURE) ×2 IMPLANT
SUT VIC AB 3-0 SH 27 (SUTURE) ×1
SUT VIC AB 3-0 SH 27X BRD (SUTURE) ×1 IMPLANT
SUT VIC AB 4-0 PS2 27 (SUTURE) ×2 IMPLANT
SYR 3ML LL SCALE MARK (SYRINGE) ×2 IMPLANT
TRAY FOLEY W/METER SILVER 16FR (SET/KITS/TRAYS/PACK) IMPLANT
TUBING EXTENTION W/L.L. (IV SETS) IMPLANT
UNDERPAD 30X30 (UNDERPADS AND DIAPERS) ×2 IMPLANT
WATER STERILE IRR 1000ML POUR (IV SOLUTION) ×2 IMPLANT

## 2016-11-30 NOTE — Progress Notes (Signed)
eLink Physician-Brief Progress Note Patient Name: Jacob Pittman DOB: Dec 04, 1945 MRN: [id number]   Date of Service  11/30/2016  HPI/Events of Note  Frequent PVC's.   eICU Interventions  Will order: 1. Mg++ and BMP STAT.      Intervention Category Major Interventions: Arrhythmia - evaluation and management  Jalei Shibley Eugene 11/30/2016, 10:59 PM

## 2016-11-30 NOTE — Consult Note (Signed)
Referring Physician: Marni Griffon NP  Patient name: Jacob Pittman MRN: [id number] DOB: April 28, 1945 Sex: male  REASON FOR CONSULT: ischemic right hand  HPI: Jacob Pittman is a 71 y.o. male who previously had right brachial aline.  He was noted to have a dusky cool right hand this morning at around 830 am.  He was transferred here from [place] on heparin.  He was admitted 8/10 with perforated ulcer which was repaired.  Pt was septic post op and has had delirium.  He is now off pressors.  He is still ventilator dependent and unable to follow commands or answer questions.  Other medical problems include renal dysfunction creatinine 2.7.  Past Medical History:  Diagnosis Date  . ANXIETY 02/04/2009  . CHF (congestive heart failure) ([place])   . Chronic systolic CHF (congestive heart failure) ([place])   . DIABETES MELLITUS, TYPE II 11/07/2008  . 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 HIP PAIN, RIGHT 11/06/2009  . HYPERLIPIDEMIA 11/07/2008  . HYPERTENSION 10/08/2008  . Nonischemic cardiomyopathy [facility])    s/p ICD implantation 03-2013 by Dr Lovena Le  . OSTEOARTHRITIS, KNEE, RIGHT 10/08/2008  . Peripheral arterial disease [facility])    Past Surgical History:  Procedure Laterality Date  . CARDIAC DEFIBRILLATOR PLACEMENT Left 04/10/13   BTK single chamber ICD implanted by Dr Lovena Le for primary prevention  . IMPLANTABLE CARDIOVERTER DEFIBRILLATOR IMPLANT N/A 04/10/2013   Procedure: IMPLANTABLE CARDIOVERTER DEFIBRILLATOR IMPLANT;  Surgeon: Evans Lance, MD;  Location: [facility] CATH LAB;  Service: Cardiovascular;  Laterality: N/A;  . LAPAROTOMY N/A 11/27/2016   Procedure: EXPLORATORY LAPAROTOMY abdominal exploration oversew pyloric channel ulcer gram patch repair perforated ulcer;  Surgeon: Alphonsa Overall, MD;  Location: [facility] ORS;  Service: General;  Laterality: N/A;  . s/p left broken arm with pin     1980's    Family History  Problem Relation Age of Onset  . Diabetes Mother   . Hypertension Mother      SOCIAL HISTORY: Social History   Social History  . Marital status: Legally Separated    Spouse name: N/A  . Number of children: 2  . Years of education: N/A   Occupational History  . retired [employer]    Social History Main Topics  . Smoking status: Former Research scientist (life sciences)  . Smokeless tobacco: Never Used  . Alcohol use Yes     Comment: 1 pint daily  . Drug use: No  . Sexual activity: No   Other Topics Concern  . Not on file   Social History Narrative   Married/separated although wife comes to appts. With him   10th grade education - poor reading and writing ability   Incarcerated for 6 months in 2009.    No Known Allergies  Current Facility-Administered Medications  Medication Dose Route Frequency Provider Last Rate Last Dose  . 0.9 %  sodium chloride infusion  250 mL Intravenous PRN Desai, Rahul P, PA-C      . 0.9 %  sodium chloride infusion   Intravenous Continuous Erick Colace, NP 10 mL/hr at 11/30/16 1053    . acetaminophen (TYLENOL) suppository 650 mg  650 mg Rectal Q4H PRN Sampson Goon, MD   650 mg at 11/29/16 2007  . artificial tears (LACRILUBE) ophthalmic ointment   Both Eyes Q3H PRN Hammonds, Sharyn Blitz, MD      . aspirin chewable tablet 81 mg  81 mg Per Tube Daily Anders Simmonds, MD   81 mg at 11/30/16 1039  . chlorhexidine gluconate (  MEDLINE KIT) (PERIDEX) 0.12 % solution 15 mL  15 mL Mouth Rinse BID Alphonsa Overall, MD   15 mL at 11/30/16 0754  . Chlorhexidine Gluconate Cloth 2 % PADS 6 each  6 each Topical Daily Alphonsa Overall, MD   6 each at 11/30/16 1000  . dextrose 10 % infusion   Intravenous Continuous Reginia Naas, RPH 50 mL/hr at 11/30/16 1610    . fentaNYL (SUBLIMAZE) injection 25-50 mcg  25-50 mcg Intravenous Q2H PRN Rigoberto Noel, MD   50 mcg at 11/29/16 2058  . fluconazole (DIFLUCAN) IVPB 200 mg  200 mg Intravenous Q24H Dorrene German, [place]   Stopped at 11/30/16 9604  . heparin ADULT infusion 100 units/mL (25000 units/256m sodium  chloride 0.45%)  1,300 Units/hr Intravenous Continuous WEmiliano Dyer RPH 13 mL/hr at 11/30/16 1055 1,300 Units/hr at 11/30/16 1055  . hydrocortisone sodium succinate (SOLU-CORTEF) 100 MG injection 50 mg  50 mg Intravenous Q12H ARigoberto Noel MD   50 mg at 11/30/16 0751  . insulin aspart (novoLOG) injection 0-20 Units  0-20 Units Subcutaneous Q4H NAlphonsa Overall MD   4 Units at 11/30/16 1200  . MEDLINE mouth rinse  15 mL Mouth Rinse QID NAlphonsa Overall MD   15 mL at 11/30/16 1101  . ondansetron (ZOFRAN-ODT) disintegrating tablet 4 mg  4 mg Oral Q6H PRN NAlphonsa Overall MD       Or  . ondansetron ([facility] injection 4 mg  4 mg Intravenous Q6H PRN NAlphonsa Overall MD      . pantoprazole (PROTONIX) injection 40 mg  40 mg Intravenous Q12H NAlphonsa Overall MD   40 mg at 11/30/16 1022  . piperacillin-tazobactam (ZOSYN) IVPB 3.375 g  3.375 g Intravenous Q8H GDorrene German [place]North [place]  Stopped at 11/30/16 1052  . potassium chloride 10 mEq in 50 mL *CENTRAL LINE* IVPB  10 mEq Intravenous Q1 Hr x 6 BErick Colace NP 50 mL/hr at 11/30/16 1240 10 mEq at 11/30/16 1240  . sodium chloride flush (NS) 0.9 % injection 10-40 mL  10-40 mL Intracatheter Q12H NAlphonsa Overall MD   20 mL at 11/30/16 1029  . sodium chloride flush (NS) 0.9 % injection 10-40 mL  10-40 mL Intracatheter PRN NAlphonsa Overall MD      . TPN ([facility] Adult without lytes   Intravenous Continuous TPN WEmiliano Dyer RPH        ROS:   Unable to obtain due to ventilator  Physical Examination  Vitals:   11/30/16 1100 11/30/16 1131 11/30/16 1200 11/30/16 1332  BP: 106/62 106/62 116/78 (!) 111/58  Pulse:  87  78  Resp: (!) 27 (!) 26 (!) 30 (!) 31  Temp: 99.3 F (37.4 C)  99.5 F (37.5 C) 99.9 F (37.7 C)  TempSrc:      SpO2: 100% 97% 99% 100%  Weight:      Height:        Body mass index is 33.25 kg/m.  General:  Does not follow commands or open eyes HEENT: Normal Neck: No bruit or JVD Pulmonary: Clear to auscultation  bilaterally Cardiac: Regular Rate and Rhythm without murmur Abdomen: distended, midline incision with right side JP,  Skin: No rash, right hand dusky from wrist down, cool to touch compared to left Extremity Pulses:  absent radial, brachial pulse bilaterally, triphasic left ulnar biphasic radial and brachial but edematous, biphasic brachial and radial absent ulnar doppler right difficult to palpate femoral, dorsalis pedis, posterior tibial pulses bilaterally, has biphasic DP  right monophasic PT left Musculoskeletal: No deformity diffuse total body edema  Neurologic: Upper and lower extremity does not move on stimulation  DATA:  CBC    Component Value Date/Time   WBC 15.9 (H) 11/30/2016 0625   RBC 3.51 (L) 11/30/2016 0625   HGB 12.0 (L) 11/30/2016 0625   HCT 33.8 (L) 11/30/2016 0625   PLT 80 (L) 11/30/2016 0625   MCV 96.3 11/30/2016 0625   MCH 34.2 (H) 11/30/2016 0625   MCHC 35.5 11/30/2016 0625   RDW 14.1 11/30/2016 0625   LYMPHSABS 0.2 (L) 11/27/2016 0530   MONOABS 0.1 11/27/2016 0530   EOSABS 0.0 11/27/2016 0530   BASOSABS 0.0 11/27/2016 0530    BMET    Component Value Date/Time   NA 132 (L) 11/30/2016 0625   K 3.1 (L) 11/30/2016 0625   CL 92 (L) 11/30/2016 0625   CO2 22 11/30/2016 0625   GLUCOSE 169 (H) 11/30/2016 0625   BUN 74 (H) 11/30/2016 0625   CREATININE 2.73 (H) 11/30/2016 0625   CREATININE 0.91 03/04/2015 1618   CALCIUM 6.6 (L) 11/30/2016 0625   GFRNONAA 22 (L) 11/30/2016 0625   GFRAA 25 (L) 11/30/2016 2637     ASSESSMENT:  Acute ischemia with threatened hand right arm in critically ill patient   PLAN:  To OR to explore brachial artery.  Plan procedure risks benefits discussed with daughter who consents for procedure.   Ruta Hinds, MD [facility] Office: [phone] Pager: [phone]

## 2016-11-30 NOTE — Progress Notes (Signed)
eLink Physician-Brief Progress Note Patient Name: Jacob Pittman DOB: 04/17/1946 MRN: [id number]   Date of Service  11/30/2016  HPI/Events of Note  Hypotension - Request to renew Norepinephrine IV infusion order.  eICU Interventions  Will review Norepinephrine IV infusion. Titrate to MAP >= 65.      Intervention Category Major Interventions: Hypotension - evaluation and management  Sommer,Steven Eugene 11/30/2016, 9:45 PM

## 2016-11-30 NOTE — Progress Notes (Signed)
PHARMACY - ADULT TOTAL PARENTERAL NUTRITION CONSULT NOTE   Pharmacy Consult for TNA Indication: Prolonged ileus  Patient Measurements: Height: 5\' 8"  (172.7 cm) Weight: 218 lb 11.1 oz (99.2 kg) IBW/kg (Calculated) : 68.4 TPN AdjBW (KG): 75.4 Body mass index is 33.25 kg/m. Usual Weight:   Insulin Requirements: 4 units in past 24hr  Current Nutrition: NPO  IVF: D10W at 67ml/hr + NS at 68ml/hr  Central access: Double lumen RIJ TPN start date: plan 8/13  ASSESSMENT                                                                                                          HPI: 71 year old man with ischemic cardiomyopathy, DM, HTN admitted 8/9 with gastric ulcer perforation and peritonitis. Underwent emergent laparotomy with omental patch repair. Post op course complicated by multiorgan failure requiring intubation and pressors. Now with significant ileus per CCS, continues with NG suction and NPO.  Significant events:  8/10: exp lap with Cheree Ditto patch repair of perforated pyloric channel ulcer  Today:    Glucose: Hx diabetes, on metformin PTA. Several episodes of hypoglycemia since surgery requiring D10 infusions. Most recent CBG 139, D10 rate decreased to 87ml/hr currently.   Electrolytes:   Na low 132   K low 3.1 (6 runs KCl ordered per MD)  Mg high 2.5  Phos high 5.7  Calcium low, corrects to 8.04   Renal: AKI (baseline SCr 1.07) - SCr 2.73  LFTs: AST slightly elevated but < 2x ULN, others WNL  TGs: check tomorrow  Prealbumin: 6.5 (8/11)  NUTRITIONAL GOALS                                                                                             RD recs (8/12): 1000-1270 kcal, 140 grams protein/day  Will not be able to meet 100% protein needs without exceeding Kcal goal with currently available Clinimix formulations.   Clinimix 5/15 at a goal rate of 24ml/hr daly + 20% fat emulsion MWF at 20 ml/hr over 12hr to provide: 100g/day protein, 1620Kcal/day  average.  PLAN  At 1800 today:  Start Clinimix (no electrolytes due to elevated Mg/Phos) 5/15 at 23ml/hr.  No lipids x 7 days while ICU status per ASPEN guidelines  Plan to advance as tolerated to the goal rate.  TPN to contain standard multivitamins and trace elements.  Reduce IVF to 10 ml/hr.  Continue SSI q4h .   TPN lab panels on Mondays & Thursdays.  F/u daily.   Loralee Pacas, PharmD, BCPS Pager: [phone] 11/30/2016,10:00 AM

## 2016-11-30 NOTE — Progress Notes (Signed)
Pt transferred from [facility] on ventilator VIA carelink. Pt placed on MC Vent upon arrival with no complications, VS within normal limits. No distress noted. RT will continue to monitor

## 2016-11-30 NOTE — Progress Notes (Signed)
PULMONARY / CRITICAL CARE MEDICINE   Name: Jacob Pittman MRN: [id number] DOB: 1945/06/18    ADMISSION DATE:  11/26/2016 CONSULTATION DATE:  8/10  REFERRING MD:  Ezzard Standing    HISTORY OF PRESENT ILLNESS:   71 year old man with ischemic cardiomyopathy, diabetic hypertensive in poor health, admitted 8/9 with gastric ulcer perforation and peritonitis. Underwent emergent laparotomy with omental patch repair He was in [place] shock requiring epinephrine and Levophed drip  STUDIES:  AXR 8/10 > air fluid level noted, free air can not be excluded. CTA chest / abd / pelv 8/10 > pneumoperitoneum.  CULTURES: BC 8/9 >> ng Blood 8/10 > ng Wound 8/10 >  Urine 8/9 >> e coli   ANTIBIOTICS: Vanc 8/10 x 1. Zosyn 8/10 >   SIGNIFICANT EVENTS: 8/10 > admit. 8/11 high gr fever 105  LINES/TUBES: ETT 8/10 >  R IJ (double lumen) CVL 8/10 >  R brachial a.line 8/10 > out Foley catheter   SUBJECTIVE:  Sedated on vent   VITAL SIGNS: BP 113/68   Pulse 77   Temp 99.7 F (37.6 C)   Resp 20   Ht 5\' 8"  (1.727 m)   Wt 218 lb 11.1 oz (99.2 kg)   SpO2 98%   BMI 33.25 kg/m   HEMODYNAMICS:    VENTILATOR SETTINGS: Vent Mode: PRVC FiO2 (%):  [30 %] 30 % Set Rate:  [25 bmp] 25 bmp Vt Set:  [550 mL] 550 mL PEEP:  [5 cmH20] 5 cmH20 Pressure Support:  [13 cmH20] 13 cmH20 Plateau Pressure:  [15 cmH20-28 cmH20] 15 cmH20  INTAKE / OUTPUT:  Intake/Output Summary (Last 24 hours) at 11/30/16 0929 Last data filed at 11/30/16 0500  Gross per 24 hour  Intake             2120 ml  Output             1190 ml  Net              930 ml     PHYSICAL EXAMINATION: General appearance:  71 Year old  Male, sedated on vent  Eyes: anicteric sclerae, moist conjunctivae; PERRL, EOMI bilaterally. Mouth:  membranes and no mucosal ulcerations;  Neck: Trachea midline; neck supple, no JVD, right IJ CVL in good position  Lungs/chest: scattered rhonchi, with normal respiratory effort and no intercostal  retractions CV: RBBB, w/ intermittent AV. Right arm is cold to touch. Pulse is doppler only.  Abdomen: distended, abd dressing is intact, pain to palp  Extremities:generalized edema or extremity Skin: Normal temperature, turgor and texture; see CV section  Psych: sedated on vent  LABS:  BMET  Recent Labs Lab 11/29/16 0620 11/29/16 1835 11/30/16 0625  NA 130* 132* 132*  K 2.7* 3.6 3.1*  CL 91* 93* 92*  CO2 23 17* 22  BUN 56* 62* 74*  CREATININE 2.32* 2.74* 2.73*  GLUCOSE 659* 176* 169*    Electrolytes  Recent Labs Lab 11/28/16 0200 11/29/16 0620 11/29/16 1835 11/30/16 0625  CALCIUM 7.0* 6.6* 6.5* 6.6*  MG 2.3 2.3  --  2.5*  PHOS 3.5 3.3  --  5.7*    CBC  Recent Labs Lab 11/28/16 0200 11/29/16 0620 11/30/16 0625  WBC 9.8 15.6* 15.9*  HGB 13.6 12.0* 12.0*  HCT 39.0 34.5* 33.8*  PLT 124* 81* 80*    Coag's  Recent Labs Lab 11/26/16 2315  INR 1.23    Sepsis Markers  Recent Labs Lab 11/26/16 2342 11/27/16 0520 11/27/16 0530 11/27/16 0740  LATICACIDVEN 7.5* 3.0*  --  3.8*  PROCALCITON  --   --  60.44  --     ABG  Recent Labs Lab 11/27/16 1010 11/28/16 0315 11/29/16 0510  PHART 7.230* 7.454* 7.452*  PCO2ART 44.7 27.9* 31.0*  PO2ART 123* 110* 117*    Liver Enzymes  Recent Labs Lab 11/27/16 0530 11/27/16 0740 11/29/16 1030  AST 62* 80* 55*  ALT 27 37 35  ALKPHOS 36* 38 55  BILITOT 2.0* 2.6* 1.9*  ALBUMIN 2.8* 3.0* 2.2*    Cardiac Enzymes  Recent Labs Lab 11/27/16 2042 11/28/16 0139 11/28/16 1206  TROPONINI 0.14* 0.17* 0.17*    Glucose  Recent Labs Lab 11/29/16 1545 11/29/16 1929 11/29/16 2115 11/29/16 2338 11/30/16 0342 11/30/16 0743  GLUCAP 126* 53* 117* 168* 85 139*    Imaging Dg Chest Port 1 View  Result Date: 11/30/2016 CLINICAL DATA:  Acute respiratory failure EXAM: PORTABLE CHEST 1 VIEW COMPARISON:  Chest radiograph from one day prior. FINDINGS: Endotracheal tube tip is 4.5 cm above the carina. Single  lead left subclavian ICD and right internal jugular central venous catheter are stable in configuration. Enteric tube enters stomach with the tip not seen on this image. Stable cardiomediastinal silhouette with mild cardiomegaly. No pneumothorax. Stable small right pleural effusion. Stable mild pulmonary edema and mild hazy bibasilar lung opacities. IMPRESSION: 1. Well-positioned support structures . 2. Stable mild congestive heart failure. 3. Stable small right pleural effusion. 4. Stable mild hazy bibasilar lung opacities, favor atelectasis. Electronically Signed   By: Delbert Phenix M.D.   On: 11/30/2016 07:06     DISCUSSION: 71 y.o. M admitted 8/9 with pneumoperitoneum due to perforated pyloric ulcer, p-lap with multiorgan failure, renal function improving & came off pressors  -  -Failed weaning.  -lasix today -start heparin for af and cold limb -cont abx and diflucan -hope wean when volume is off  ASSESSMENT / PLAN:  PULMONARY Acute hypoxic Respiratory failure pcxr personally reviewed has basilar atx. Some increased vascular congestion. Tubes/lines in good position Failed SBT attempt PLAN Daily assessment for SBTs Lasix as BP/BUN/cr allow Repeat CXR in am  RAS goal 0   CARDIOVASCULAR Hx PAD, sCHF with NICM (s/p ICD placement 2014, echo from Feb 2016 with EF 15%, G2DD), HTN, HLD. NSTEMI -peak Tp 0.17 Cool right upper extremity s/p arterial line in brachial artery  Intermittent AFib   PLAN Cont asa and rosuvastatin  Start heparin gtt Holding coreg,  metolazone and spironolactone Will get arterial doppler. Will d/w vascular   RENAL AKI;  baseline of 1.07, now seems to hit a plateau around 2.7 Hypokalemia  Mild hyponatremia  PLAN Replace K Repeat chem in am   GASTROINTESTINAL Perforated pyloric channel ulcer - s/p repair 8/10 (Dr. Ezzard Standing). Post-op ileus  Severe protein calorie malnutrition  Elevated LFTs Plan Start TNA Cont PPI BID NGT to LIWS Post-op care per  surg   HEME Anemia of critical illness Thrombocytopenia  Plan Cont [place] heparin Trend cbc Transfuse per protocol    INFECTIOUS DISEASE Peritonitis Ecoli UTI (sensitive to zosyn) - due to perforated pyloric ulcer with 2400cc gastric contents in abdominal cavity - shock resolved.  - wbc curve is flat. No fever  Plan Taper steroids Cont zosyn started 8/9 Cont fluconazole started 8/11   ENDOCRINE DM:  Hypoglycemic 8/11, fluctuating Glycemic control OK Plan NPO D10 titrate 50-75 ml/hr to keep glucose > 100 Holding orals    NEUROLOGIC Acute encephalopathy - Off sedation. Plan PAD protocol; fent for pain  RAS goal 0 to -1  My cct 40 min   Simonne Martinet ACNP-BC [facility] Pulmonary/Critical Care Pager # [phone] OR # [phone] if no answer

## 2016-11-30 NOTE — Progress Notes (Signed)
Nutrition Follow-up  DOCUMENTATION CODES:   Obesity unspecified  INTERVENTION:  - TPN per Pharmacy. - RD will continue to monitor nutrition-related plan and provide interventions as warranted.   NUTRITION DIAGNOSIS:   Inadequate oral intake related to inability to eat as evidenced by NPO status. -ongoing  GOAL:   Provide needs based on ASPEN/SCCM guidelines -unmet  MONITOR:   Vent status, Weight trends, Labs, Skin, I & O's, Other (Comment) (TPN initiation and TPN regimen)  REASON FOR ASSESSMENT:   Consult New TPN/TNA  ASSESSMENT:   71 y.o.M with CHF (EF 15%), HTN, DM, PAD, and OA, who presented to [facility] 8/9 with worsening lower abd pain over 3 days, belching, vomiting x 3 days, and constipation (no BM x 2 weeks). In the ER he had a lactate of 9.4 and was given 4L IVF per sepsis protocol. He then became hypoxic with rhonchi and increased work of breathing. CT revealed pneumoperitoneum with moderate amount of free fluid due to a bowel perforation.   8/13 Consult received for new TPN. Double lumen R IJ in place. Per Pharmacy note from earlier this AM, plan for Clinimix 5/15 @ 40 mL/hr without ILE x7 days per ICU protocol per ASPEN guidelines. This regimen will provide 48 grams of protein and 682 kcal. Planned goal for TPN at this time, per Pharmacy note, is Clinimix 5/15 @ 83 mL/hr which will provide 100 grams of protein (71% estimated protein need) and 1416 kcal. When ILE added: 20% ILE @ 20 mL/hr x12 hours to provide an additional 480 kcal/day.   Pt remains intubated with NGT in place with ~80cc dark brown/red-ish output at this time. Per doc flow sheet, he had 425cc output over night and 200cc output from midnight 8/11-start of night shift 8/12. Weight continues to trend up since admission and is now +8.5 kg from admission of 90.7 kg (which was used to estimate nutrition needs).  Medical team reports pt with R arterial occlusion with need for emergent vascular surgery. Plan to  transfer [facility] and order to transfer within the next 1 hour or less. RD at [facility] to re-assess estimated needs following surgery and adjust as needed.   Patient is currently intubated on ventilator support MV: 13.7 L/min Temp (24hrs), Avg:101.1 F (38.4 C), Min:99.7 F (37.6 C), Max:102 F (38.9 C) Propofol: none BP: 90/73 and MAP: 78  Medications reviewed; 40 mg IV Lasix x1 dose today, 50 mg Solu-cortef BID, sliding scale Novolog, 40 mg IV Protonix BID, 10 mEq IV KCl x6 runs today Labs reviewed; CBG: 139 mg/dL today, Na: 161 mmol/L, K: 3.1 mmol/L, Cl: 92 mmol/L, BUN: 74 mg/dL, creatinine: 0.96 mg/dL, Ca: 6.6 mg/dL, Phos: 5.7 mg/dL, Mg: 2.5 mg/dL, GFR: 25 mL/min.   IVF: D10 @ 50 mL/hr (408 kcal from dextrose). Drips: none   8/10 - Spoke with daughter, who was at bedside.  - Pt usually has a very good appetite and is able to eat favorite foods (gave the examples of ham hock and pinto beans) without issue.  - She states that 2 days PTA (although H&P, as outlined above, states 3 days) pt began experiencing vomiting with all PO intakes, including water.  - Daughter is unsure if pt felt nauseated prior to episodes of emesis as vomiting happened nearly instantaneously and pt was having ongoing hiccupping.  - Intubated at ~0130 in the ED.  - Subsequently underwent ex lap with oversewing of pyloric channel ulcer, repair of perforated ulcer.  - NGT placed to suction.  -  NGT in place at this time with no drainage present in canister; no documented output from overnight. - Physical assessment shows no muscle or fat wasting, abdomen distended and very firm and rigid, mild generalized edema.  - Per chart review, many weight fluctuations over the past nearly 2 years; will monitor weight trends closely during hospitalization.  Patient is currently intubated on ventilator support MV: 13.6 L/min Temp (24hrs), Avg:97.5 F (36.4 C), Min:97.2 F (36.2 C), Max:98.2 F (36.8 C) Propofol: none BP:  76/44 and MAP: 36 IVF: D5-150 mEq sodium bicarb @ 100 mL/hr (408 kcal from dextrose). Drip: Levo 2 14 mcg/min.   Diet Order:  Diet NPO time specified  Skin:  Wound (see comment) (Abdominal incision from 8/10)  Last BM:  8/13  Height:   Ht Readings from Last 1 Encounters:  11/27/16 5\' 8"  (1.727 m)    Weight:   Wt Readings from Last 1 Encounters:  11/30/16 218 lb 11.1 oz (99.2 kg)    Ideal Body Weight:  70 kg  BMI:  Body mass index is 33.25 kg/m.  Estimated Nutritional Needs:   Kcal:  1000-1270 (11-14 kcal/kg)  Protein:  140 grams (2 grams/kg IBW)  Fluid:  >/= 1.5 L/day  EDUCATION NEEDS:   No education needs identified at this time    Trenton Gammon, MS, RD, LDN, CNSC Inpatient Clinical Dietitian Pager # [phone] After hours/weekend pager # [phone]

## 2016-11-30 NOTE — Progress Notes (Signed)
eLink Physician-Brief Progress Note Patient Name: Jacob Pittman DOB: 1945/04/23 MRN: [id number]   Date of Service  11/30/2016  HPI/Events of Note  Limited IV access - request for a midline catheter.   eICU Interventions  Will order a midline catheter.      Intervention Category Intermediate Interventions: Other:  Jacob Pittman 11/30/2016, 8:01 PM

## 2016-11-30 NOTE — Anesthesia Preprocedure Evaluation (Addendum)
Anesthesia Evaluation  Patient identified by MRN, date of birth, ID band Patient awake    Reviewed: Allergy & Precautions, H&P , NPO status , Patient's Chart, lab work & pertinent test results, reviewed documented beta blocker date and time   Airway Mallampati: Intubated       Dental no notable dental hx. (+) Teeth Intact, Dental Advisory Given   Pulmonary neg pulmonary ROS, former smoker,    Pulmonary exam normal breath sounds clear to auscultation       Cardiovascular hypertension, Pt. on medications and Pt. on home beta blockers + Peripheral Vascular Disease and +CHF   Rhythm:Regular Rate:Normal     Neuro/Psych Anxiety negative neurological ROS  negative psych ROS   GI/Hepatic negative GI ROS, Neg liver ROS,   Endo/Other  diabetes, Type 2, Oral Hypoglycemic Agents  Renal/GU negative Renal ROS  negative genitourinary   Musculoskeletal  (+) Arthritis , Osteoarthritis,    Abdominal   Peds  Hematology negative hematology ROS (+)   Anesthesia Other Findings   Reproductive/Obstetrics negative OB ROS                            Anesthesia Physical Anesthesia Plan  ASA: IV and emergent  Anesthesia Plan: General   Post-op Pain Management:    Induction: Intravenous  PONV Risk Score and Plan: 2 and Treatment may vary due to age or medical condition  Airway Management Planned: Oral ETT  Additional Equipment: Arterial line  Intra-op Plan:   Post-operative Plan: Post-operative intubation/ventilation  Informed Consent: I have reviewed the patients History and Physical, chart, labs and discussed the procedure including the risks, benefits and alternatives for the proposed anesthesia with the patient or authorized representative who has indicated his/her understanding and acceptance.   Dental advisory given  Plan Discussed with: CRNA  Anesthesia Plan Comments:        Anesthesia  Quick Evaluation

## 2016-11-30 NOTE — Progress Notes (Signed)
ANTICOAGULATION CONSULT NOTE - Initial Consult  Pharmacy Consult for Heparin Indication: atrial fibrillation, ischemic limb  No Known Allergies  Patient Measurements: Height: '5\' 8"'$  (172.7 cm) Weight: 218 lb 11.1 oz (99.2 kg) IBW/kg (Calculated) : 68.4 Heparin Dosing Weight: 89.6 kg  Vital Signs: Temp: 98.4 F (36.9 C) (08/13 2100) Temp Source: Oral (08/13 2100) BP: 89/69 (08/13 1941) Pulse Rate: 68 (08/13 1941)  Labs:  Recent Labs  11/28/16 0139  11/28/16 0200 11/28/16 1206 11/29/16 0620 11/29/16 1835 11/30/16 0625 11/30/16 1036 11/30/16 2124  HGB  --   < > 13.6  --  12.0*  --  12.0*  --   --   HCT  --   --  39.0  --  34.5*  --  33.8*  --   --   PLT  --   --  124*  --  81*  --  80*  --   --   APTT  --   --   --   --   --   --   --  36  --   LABPROT  --   --   --   --   --   --   --  16.8*  --   INR  --   --   --   --   --   --   --  1.35  --   HEPARINUNFRC  --   --   --   --   --   --   --   --  0.23*  CREATININE  --   < > 2.81*  --  2.32* 2.74* 2.73*  --  2.98*  TROPONINI 0.17*  --   --  0.17*  --   --   --   --   --   < > = values in this interval not displayed.  Estimated Creatinine Clearance: 25.2 mL/min (A) (by C-G formula based on SCr of 2.98 mg/dL (H)).   Medical History: Past Medical History:  Diagnosis Date  . ANXIETY 02/04/2009  . CHF (congestive heart failure) ([place])   . Chronic systolic CHF (congestive heart failure) ([place])   . DIABETES MELLITUS, TYPE II 11/07/2008  . 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 HIP PAIN, RIGHT 11/06/2009  . HYPERLIPIDEMIA 11/07/2008  . HYPERTENSION 10/08/2008  . Nonischemic cardiomyopathy [facility])    s/p ICD implantation 03-2013 by Dr Lovena Le  . OSTEOARTHRITIS, KNEE, RIGHT 10/08/2008  . Peripheral arterial disease (HCC)     Medications:  Scheduled:  . aspirin  81 mg Per Tube Daily  . chlorhexidine gluconate (MEDLINE KIT)  15 mL Mouth Rinse BID  . Chlorhexidine Gluconate Cloth  6 each Topical Daily  . hydrocortisone  sod succinate (SOLU-CORTEF) inj  50 mg Intravenous Q12H  . insulin aspart  0-20 Units Subcutaneous Q4H  . mouth rinse  15 mL Mouth Rinse QID  . pantoprazole (PROTONIX) IV  40 mg Intravenous Q12H  . sodium chloride flush  10-40 mL Intracatheter Q12H   Infusions:  . sodium chloride 250 mL (11/30/16 1800)  . sodium chloride 10 mL/hr at 11/30/16 1500  . fluconazole (DIFLUCAN) IV Stopped (11/30/16 0851)  . heparin 1,300 Units/hr (11/30/16 2130)  . norepinephrine (LEVOPHED) Adult infusion    . piperacillin-tazobactam (ZOSYN)  IV Stopped (11/30/16 1052)  . potassium chloride    . TPN (CLINIMIX) Adult without lytes 40 mL/hr at 11/30/16 1955   PRN: sodium chloride, acetaminophen, artificial tears, fentaNYL (SUBLIMAZE) injection, fentaNYL (SUBLIMAZE) injection, ondansetron **  OR** ondansetron (ZOFRAN) IV, sodium chloride flush  Assessment: 71 yo male admitted 8/9 with gastric ulcer perforation and peritonitis. Underwent emergent laparotomy with omental patch repair. Post op course complicated by multiorgan failure requiring intubation and pressors. On 8/13, Pharmacy consulted to dose IV heparin for afib and presumed ischemic right upper extremity. CCM aware of thrombocytopenia.  Pt transferred [facility] to [facility] for brachial embolectomy this afternoon, heparin level drawn at 2124 was 0.23, called RN, heparin has been running after procedure, pt is been bleeding from incision site & IV site, VVS told RN to hold heparin gtt tonight.   Goal of Therapy:  Heparin level 0.3-0.7 units/ml Monitor platelets by anticoagulation protocol: Yes   Plan:  Hold heparin per VVS We will f/u plans in AM regarding anticoagualtion Monitor Pltc, hgb closely  Maryanna Shape, PharmD, BCPS  Clinical Pharmacist  Pager: [phone]   11/30/2016,10:32 PM

## 2016-11-30 NOTE — Consult Note (Signed)
Spoke with Anders Simmonds NP at [facility].  Pt with cool mottled extremity after brachial aline.  Discussed that this is a vascular emergency and patient needs to be transferred to [facility] within the hour to preserve arm function.  Fabienne Bruns, MD [facility] Office: [phone] Pager: [phone]

## 2016-11-30 NOTE — Progress Notes (Signed)
Inpatient Diabetes Program Recommendations  AACE/ADA: New Consensus Statement on Inpatient Glycemic Control (2015)  Target Ranges:  Prepandial:   less than 140 mg/dL      Peak postprandial:   less than 180 mg/dL (1-2 hours)      Critically ill patients:  140 - 180 mg/dL   Lab Results  Component Value Date   GLUCAP 139 (H) 11/30/2016   HGBA1C 5.8 (H) 11/27/2016    Review of Glycemic Control  Hypoglycemia x 2 HgbA1C - 5.8%  Inpatient Diabetes Program Recommendations:    Decrease Novolog to 0-15 units Q4H.  Will follow.  Thank you. Ailene Ards, RD, LDN, CDE Inpatient Diabetes Coordinator [phone]

## 2016-11-30 NOTE — Progress Notes (Signed)
No vent check completed at 1600 due to pt being in OR

## 2016-11-30 NOTE — Progress Notes (Signed)
ANTICOAGULATION CONSULT NOTE - Initial Consult  Pharmacy Consult for Heparin Indication: atrial fibrillation, ischemic limb  No Known Allergies  Patient Measurements: Height: _0  (172.7 cm) Weight: 218 lb 11.1 oz (99.2 kg) IBW/kg (Calculated) : 68.4 Heparin Dosing Weight: 89.6 kg  Vital Signs: Temp: 99.7 F (37.6 C) (08/13 0700) BP: 94/55 (08/13 0843) Pulse Rate: 81 (08/13 0843)  Labs:  Recent Labs  11/27/16 2042 11/28/16 0139  11/28/16 0200 11/28/16 1206 11/29/16 0620 11/29/16 1835 11/30/16 0625  HGB  --   --   < > 13.6  --  12.0*  --  12.0*  HCT  --   --   --  39.0  --  34.5*  --  33.8*  PLT  --   --   --  124*  --  81*  --  80*  CREATININE  --   --   --  2.81*  --  2.32* 2.74* 2.73*  TROPONINI 0.14* 0.17*  --   --  0.17*  --   --   --   < > = values in this interval not displayed.  Estimated Creatinine Clearance: 27.5 mL/min (A) (by C-G formula based on SCr of 2.73 mg/dL (H)).   Medical History: Past Medical History:  Diagnosis Date  . ANXIETY 02/04/2009  . CHF (congestive heart failure) ([place])   . Chronic systolic CHF (congestive heart failure) ([place])   . DIABETES MELLITUS, TYPE II 11/07/2008  . 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 HIP PAIN, RIGHT 11/06/2009  . HYPERLIPIDEMIA 11/07/2008  . HYPERTENSION 10/08/2008  . Nonischemic cardiomyopathy [facility])    s/p ICD implantation 03-2013 by Dr Lovena Le  . OSTEOARTHRITIS, KNEE, RIGHT 10/08/2008  . Peripheral arterial disease (HCC)     Medications:  Scheduled:  . aspirin  81 mg Per Tube Daily  . chlorhexidine gluconate (MEDLINE KIT)  15 mL Mouth Rinse BID  . Chlorhexidine Gluconate Cloth  6 each Topical Daily  . furosemide  40 mg Intravenous Once  . hydrocortisone sod succinate (SOLU-CORTEF) inj  50 mg Intravenous Q12H  . insulin aspart  0-20 Units Subcutaneous Q4H  . mouth rinse  15 mL Mouth Rinse QID  . pantoprazole (PROTONIX) IV  40 mg Intravenous Q12H  . sodium chloride flush  10-40 mL Intracatheter  Q12H   Infusions:  . sodium chloride    . sodium chloride 50 mL/hr at 11/30/16 0300  . dextrose 75 mL/hr at 11/30/16 0658  . fluconazole (DIFLUCAN) IV 200 mg (11/30/16 0751)  . piperacillin-tazobactam (ZOSYN)  IV 3.375 g (11/30/16 6754)  . potassium chloride     PRN: sodium chloride, acetaminophen, artificial tears, fentaNYL (SUBLIMAZE) injection, ondansetron **OR** ondansetron (ZOFRAN) IV, sodium chloride flush  Assessment: 71 yo male admitted 8/9 with gastric ulcer perforation and peritonitis. Underwent emergent laparotomy with omental patch repair. Post op course complicated by multiorgan failure requiring intubation and pressors. On 8/13, Pharmacy consulted to dose IV heparin for afib and presumed ischemic right upper extremity. CCM aware of thrombocytopenia.  Significant events: 8/10: exp lap with Phillip Heal patch repair of perforated pyloric channel ulcer 8/13: upper extremity doppler ordered  Goal of Therapy:  Heparin level 0.3-0.7 units/ml Monitor platelets by anticoagulation protocol: Yes   Plan:   No heparin bolus due to thrombocytopenia  Heparin infusion 1300 units/hr  Heparin level in 8 hours  Daily heparin level and CBC  Monitor closely for signs/symptoms of bleeding  Peggyann Juba, PharmD, BCPS Pager: [phone] 11/30/2016,9:42 AM

## 2016-11-30 NOTE — Care Management Note (Signed)
Case Management Note  Patient Details  Name: Kyre Olshansky MRN: [id number] Date of Birth: 1945/07/16  Subjective/Objective:                  3 days post op remains on the vent.  Action/Plan: Date:  November 30, 2016 Chart reviewed for concurrent status and case management needs. Will continue to follow patient progress. Discharge Planning: following for needs Expected discharge date: [id number] Marcelle Smiling, BSN, [place], [place]   [phone]  Expected Discharge Date:   (UNKNOWN)               Expected Discharge Plan:  Home/Self Care  In-House Referral:     Discharge planning Services  CM Consult  Post Acute Care Choice:    Choice offered to:     DME Arranged:    DME Agency:     HH Arranged:    HH Agency:     Status of Service:  In process, will continue to follow  If discussed at Long Length of Stay Meetings, dates discussed:    Additional Comments:  Golda Acre, RN 11/30/2016, 8:57 AM

## 2016-11-30 NOTE — Anesthesia Postprocedure Evaluation (Signed)
Anesthesia Post Note  Patient: Izahia Molder  Procedure(s) Performed: Procedure(s) (LRB): BRACHIAL EMBOLECTOMY WITH VEIN PATCH ANGIOPLASTY (Right)     Patient location during evaluation: SICU Anesthesia Type: General Level of consciousness: sedated Pain management: pain level controlled Vital Signs Assessment: post-procedure vital signs reviewed and stable Respiratory status: patient remains intubated per anesthesia plan Cardiovascular status: stable Anesthetic complications: no    Last Vitals:  Vitals:   11/30/16 1332 11/30/16 1500  BP: (!) 111/58 118/82  Pulse: 78 76  Resp: (!) 31 (!) 33  Temp: 37.7 C   SpO2: 100% 98%    Last Pain:  Vitals:   11/29/16 2100  TempSrc:   PainSc: 7                  Rhea Kaelin,W. EDMOND

## 2016-11-30 NOTE — Progress Notes (Signed)
Patient ID: Jacob Pittman, male   DOB: 07-02-1945, 71 y.o.   MRN: [id number] 3 Days Post-Op   Subjective: On ventilator. Opens eyes to voice but will not follow commands or answer questions.  Objective: Vital signs in last 24 hours: Temp:  [99.7 F (37.6 C)-102 F (38.9 C)] 99.7 F (37.6 C) (08/13 0700) Resp:  [0-39] 20 (08/13 0700) BP: (96-157)/(57-114) 113/68 (08/13 0700) SpO2:  [65 %-100 %] 98 % (08/13 0700) FiO2 (%):  [30 %] 30 % (08/13 0226) Weight:  [99.2 kg (218 lb 11.1 oz)] 99.2 kg (218 lb 11.1 oz) (08/13 0345) Last BM Date: 11/30/16  Intake/Output from previous day: 08/12 0701 - 08/13 0700 In: 2120 [I.V.:2020; IV Piggyback:100] Out: 1340 [Urine:775; Emesis/NG output:525; Drains:40] Intake/Output this shift: No intake/output data recorded.  General appearance: On ventilator GI: Very distended and tympanitic. Mild diffuse tenderness. Extremities: 1+ edema, left foot and right arm somewhat cool Pulses: 2+ and symmetric None palpable Neurologic: Opens eyes to voice and responds to pain. Withdraws extremities. Incision/Wound: Midline wound with slight eschar at base. No unusual drainage.  Lab Results:   Recent Labs  11/29/16 0620 11/30/16 0625  WBC 15.6* 15.9*  HGB 12.0* 12.0*  HCT 34.5* 33.8*  PLT 81* 80*   BMET  Recent Labs  11/29/16 1835 11/30/16 0625  NA 132* 132*  K 3.6 3.1*  CL 93* 92*  CO2 17* 22  GLUCOSE 176* 169*  BUN 62* 74*  CREATININE 2.74* 2.73*  CALCIUM 6.5* 6.6*     Studies/Results: Dg Chest Port 1 View  Result Date: 11/30/2016 CLINICAL DATA:  Acute respiratory failure EXAM: PORTABLE CHEST 1 VIEW COMPARISON:  Chest radiograph from one day prior. FINDINGS: Endotracheal tube tip is 4.5 cm above the carina. Single lead left subclavian ICD and right internal jugular central venous catheter are stable in configuration. Enteric tube enters stomach with the tip not seen on this image. Stable cardiomediastinal silhouette with mild  cardiomegaly. No pneumothorax. Stable small right pleural effusion. Stable mild pulmonary edema and mild hazy bibasilar lung opacities. IMPRESSION: 1. Well-positioned support structures . 2. Stable mild congestive heart failure. 3. Stable small right pleural effusion. 4. Stable mild hazy bibasilar lung opacities, favor atelectasis. Electronically Signed   By: Delbert Phenix M.D.   On: 11/30/2016 07:06   Dg Chest Port 1 View  Result Date: 11/29/2016 CLINICAL DATA:  Acute respiratory failure EXAM: PORTABLE CHEST 1 VIEW COMPARISON:  11/27/2016 FINDINGS: Cardiac shadow remains enlarged. Defibrillator is again seen. Endotracheal tube, nasogastric catheter and right jugular central line are again seen and stable. The overall inspiratory effort is poor. No focal confluent infiltrate is seen. No acute bony abnormality is noted. Improvement in the vascular congestion interstitial edema is noted. IMPRESSION: Poor inspiratory effort with overall decrease in vascular congestion. Tubes and lines as described. Electronically Signed   By: Alcide Clever M.D.   On: 11/29/2016 07:04    Anti-infectives: Anti-infectives    Start     Dose/Rate Route Frequency Ordered Stop   11/28/16 0800  fluconazole (DIFLUCAN) IVPB 200 mg     200 mg 100 mL/hr over 60 Minutes Intravenous Every 24 hours 11/27/16 0624     11/27/16 0630  fluconazole (DIFLUCAN) IVPB 400 mg     400 mg 100 mL/hr over 120 Minutes Intravenous  Once 11/27/16 0622 11/27/16 0841   11/27/16 0600  piperacillin-tazobactam (ZOSYN) IVPB 3.375 g     3.375 g 12.5 mL/hr over 240 Minutes Intravenous Every 8 hours 11/27/16 0534  11/27/16 0515  piperacillin-tazobactam (ZOSYN) IVPB 3.375 g  Status:  Discontinued     3.375 g 100 mL/hr over 30 Minutes Intravenous  Once 11/27/16 0510 11/27/16 0518   11/27/16 0030  piperacillin-tazobactam (ZOSYN) IVPB 3.375 g     3.375 g 100 mL/hr over 30 Minutes Intravenous  Once 11/27/16 0016 11/27/16 0755   11/27/16 0030  vancomycin  (VANCOCIN) IVPB 1000 mg/200 mL premix     1,000 mg 200 mL/hr over 60 Minutes Intravenous  Once 11/27/16 0016 11/27/16 0756      Assessment/Plan: s/p Procedure(s): EXPLORATORY LAPAROTOMY abdominal exploration oversew pyloric channel ulcer gram patch repair perforated ulcer.  Diffuse peritonitis. On Zosyn and Diflucan Multiple organ failure, now off pressors. VDRF, ARI being managed by CCM Some persistent leukocytosis. Creatinine stable. Peripheral vascular disease. Extremities appear adequately perfused and pulses will be dopplered by nursing. He appears to have a significant ileus. Continue NG suction and nothing by mouth. This may be prolonged and he may benefit from TNA.    LOS: 3 days    Bard Haupert T 11/30/2016

## 2016-11-30 NOTE — Transfer of Care (Signed)
Immediate Anesthesia Transfer of Care Note  Patient: Jacob Pittman  Procedure(s) Performed: Procedure(s): BRACHIAL EMBOLECTOMY WITH VEIN PATCH ANGIOPLASTY (Right)  Patient Location: SICU  Anesthesia Type:General  Level of Consciousness: Patient remains intubated per anesthesia plan  Airway & Oxygen Therapy: Patient remains intubated per anesthesia plan and Patient placed on Ventilator (see vital sign flow sheet for setting)  Post-op Assessment: Report given to RN and Post -op Vital signs reviewed and stable  Post vital signs: Reviewed and stable  Last Vitals:  Vitals:   11/30/16 1332 11/30/16 1500  BP: (!) 111/58 118/82  Pulse: 78 76  Resp: (!) 31 (!) 33  Temp: 37.7 C   SpO2: 100% 98%    Last Pain:  Vitals:   11/29/16 2100  TempSrc:   PainSc: 7          Complications: No apparent anesthesia complications

## 2016-11-30 NOTE — Progress Notes (Signed)
Pharmacy Antibiotic Note  Jacob Pittman is a 71 y.o. male admitted on 11/26/2016 with intra-abdominal infection, peritonitis s/p gram patch repair perforated ulcer.  Pharmacy has been consulted for zosyn and diflucan dosing.  Today, 11/30/2016 Day #4 Zosyn/Diflucan Tmax 100.2 WBC 15.9 SCr 2.73, CrCl 27 ml/min  Plan: Zosyn 3.375g IV q8h (4 hour infusion).  Diflucan 400 mg x1 (8/10) then 200 mg IV q24h  Height: 5\' 8"  (172.7 cm) Weight: 218 lb 11.1 oz (99.2 kg) IBW/kg (Calculated) : 68.4  Temp (24hrs), Avg:101 F (38.3 C), Min:99.7 F (37.6 C), Max:102 F (38.9 C)   Recent Labs Lab 11/26/16 2306 11/26/16 2342 11/27/16 0520 11/27/16 0530 11/27/16 0740 11/27/16 1401 11/28/16 0200 11/29/16 0620 11/29/16 1835 11/30/16 0625  WBC  --   --   --  2.4* 3.7*  --  9.8 15.6*  --  15.9*  CREATININE  --   --   --  1.95* 2.05* 2.46* 2.81* 2.32* 2.74* 2.73*  LATICACIDVEN 9.49* 7.5* 3.0*  --  3.8*  --   --   --   --   --     Estimated Creatinine Clearance: 27.5 mL/min (A) (by C-G formula based on SCr of 2.73 mg/dL (H)).    No Known Allergies  Antimicrobials this admission: 8/10 zosyn >>  8/10 diflucan >> 8/10 vancomycin >> x1  Dose adjustments this admission:   Microbiology results: 8/10 BCx 2: ngtd 8/9 BCx:2 ngtd 8/9 UCx: > 100K E coli R to Unaysn/amp, sens to all others, sens to Zosyn 8/10 abd wound: normal skin flora 8/10 MRSA PCR: neg  Thank you for allowing pharmacy to be a part of this patient's care.  Loralee Pacas, PharmD, BCPS Pager: [phone] 11/30/2016 9:08 AM

## 2016-11-30 NOTE — Progress Notes (Signed)
PHARMACY - ADULT TOTAL PARENTERAL NUTRITION CONSULT NOTE   Pharmacy Consult for TPN Indication: Prolonged Ileus  Patient Measurements: Height: 5\' 8"  (172.7 cm) Weight: 218 lb 11.1 oz (99.2 kg) IBW/kg (Calculated) : 68.4 TPN AdjBW (KG): 75.4 Body mass index is 33.25 kg/m.  Assessment:  71 yo M presents with gastric ulcer perforation and peritonitis. PMH includes ischemic cardiomyopathy, DM, HTN. He underwent emergent ex-lap with omental patch repair. Had multi-organ failure post op requiring intubation and pressors. Now with significant ileus.   GI: NG tube output at yesterday. Is producing stool. Albumin low at 2.2. Prealbumin low at 6.5. Last BM 8/13. PPI Endo: CBGs uncontrolled yesterday (40-270s) On hydrocortisone, SSI Insulin requirements in the past 24 hours: 4 units Lytes: K low at 3.1 (receiving replacement) Na low at but stable. Mg 2.5, Phos elevated at 5.7. CoCa 7.9. Renal: SCr elevated at 2.73, CrCl ~59ml/min. UOP low at 0.52ml/kg/hr. BUN elevated and rising to 74 Pulm: Intubated on 30% of FiO2 Cards: On heparin gtt for Afib and ischemic limb Hepatobil: Tbili elevated at 1.9 but trending down (peaked at 3.0) LFTs ok Neuro: Only on fentanyl prn for pain? No sedation? GCS 10. RASS currently -2 (goal 0) ID: On Zosyn and fluconazole for bowel perforation. Tmax of 102 yesterday and WBC 15.9.  Best Practices: Heparin gtt TPN Access: Double lumen RIJ >> TPN start date: 8/13 >>  Nutritional Goals (per RD recommendation on 8/13): KCal: 1000-1270  Protein: 140 g  Current Nutrition:  NPO Clinimix  Plan:  Start Clinimix 5/15 (no lytes) at 13ml/hr per [facility] note. Will make TPN at [facility]. Will not be able to meet nutritional needs with Clinimix Hold 20% lipid emulsion for first 7 days for ICU patients per ASPEN guidelines (Start date 8/20) Continue NS at 31ml/hr per MD Stop D10 tonight at 1800 Add MVI and trace elements in TPN Continue resistant SSI and adjust  as needed Monitor TPN labs F/U transfer to [facility], improvement in clinical status  Enzo Bi, PharmD, BCPS Clinical Pharmacist Pager [phone] 11/30/2016 1:35 PM

## 2016-11-30 NOTE — Op Note (Signed)
Procedure: Right brachial ulnar and radial artery embolectomy  Preoperative diagnosis: Acute ischemia right hand  Postoperative diagnosis: Same  Anesthesia: Gen.  Assistant: Lianne Cure, PA-C  Operative findings: #1 acute thrombus brachial artery  #2 monophasic radial doppler signal at end of case  Specimens: thrombus material  Operative details: After obtaining informed consent, the patient was taken to the operating room. The patient was placed in supine position on the operating room table. After induction of general anesthesia, the patient's entire right upper extremity was prepped and draped in the usual sterile fashion. Next, a longitudinal incision was made on the right arm at the level of the antecubital crease. The incision was carried down through the subcutaneous tissues down to the level of the bicipital aponeurosis. This was taken down with cautery. The brachial artery was dissected free circumferentially.  The incision had been placed over a previous area of puncture for brachial artery a line. Dissection was carried up to the mid brachial artery to define the area that had been punctured in the artery. There was staining of the adventitia in this area. There was no active bleeding. There was a pulse within the artery. The artery was controlled proximal and distal to this area staining with vessel loops. The patient was given 5000 units of intravenous heparin. A longitudinal opening was made in the brachial artery over the area staining. Upon opening the artery there was a long string of thrombus within the artery. This was removed and direct vision. There was excellent arterial inflow and some arterial backbleeding. A #3 Fogarty catheter was passed proximally and distally up the proximal and distal brachial artery and 3 clean passes was obtained. A small segment of superficial vein was then harvested and opened longitudinally and sewn on as a patch angioplasty using running 7-0  Prolene suture. Prior to completion anastomosis was forebled backbled and thoroughly flushed. Anastomosis was secured Vesseloops were released and there was a palpable pulse in the brachial artery medially. There is off her flow in the brachial artery but I could not detect any radial or ulnar Doppler flow. The patient was on Levothroid and Neo-Synephrine at this time. But I decided to further explore the radial and ulnar arteries to make sure that there is no thrombus distally. Dissection was carried down to the bifurcation of the brachial artery. The radial and ulnar arteries were dissected free circumferentially and vessel loops were placed around these.  He was given an additional bolus of 5000 units of intravenous heparin. After 2 minutes of circulation time, Vesseloops were used to control the proximal brachial artery as well as the radial and ulnar arteries. A transverse arteriotomy was made just above the brachial bifurcation.   I then proceeded to pass a #3 Fogarty catheter down the radial and ulnar arteries.   Catheters were passed down until the catheter was at least 40 cm and 3 clean passes were obtained. Each branch was then thoroughly flushed with heparinized saline. All thrombotic material was sent to pathology as specimen. The arteriotomy was then repaired using a running 7-0 Prolene suture. At completion of the anastomosis everything was forebled backbled and thoroughly flushed. The anastomosis was secured.  Vesseloops were released and there was an audible radial Doppler signal immediately. There was monophasic radial Doppler flow. There was no ulnar Doppler flow. There was good flow in the brachial artery. There was good Doppler flow in the proximal radial and ulnar arteries. At this point hemostasis was obtained with direct pressure and  Avitene. Subcutaneous tissues were reapproximated using running 3-0 Vicryl suture. The skin was closed with staples. The patient tolerated the procedure well and  there were no complications. Instrument sponge and needle counts were correct at the end of the case. Patient was taken to the ICU in critical but stable condition on a Levophed drip.  Fabienne Bruns, MD [facility] Office: [phone] Pager: [phone]

## 2016-12-01 ENCOUNTER — Inpatient Hospital Stay (HOSPITAL_COMMUNITY): Payer: Medicare PPO

## 2016-12-01 ENCOUNTER — Encounter (HOSPITAL_COMMUNITY): Payer: Self-pay | Admitting: Vascular Surgery

## 2016-12-01 DIAGNOSIS — J9601 Acute respiratory failure with hypoxia: Secondary | ICD-10-CM

## 2016-12-01 DIAGNOSIS — G9340 Encephalopathy, unspecified: Secondary | ICD-10-CM

## 2016-12-01 LAB — COMPREHENSIVE METABOLIC PANEL
ALT: 88 U/L — AB (ref 17–63)
AST: 118 U/L — AB (ref 15–41)
Albumin: 2 g/dL — ABNORMAL LOW (ref 3.5–5.0)
Alkaline Phosphatase: 69 U/L (ref 38–126)
Anion gap: 15 (ref 5–15)
BUN: 79 mg/dL — AB (ref 6–20)
CHLORIDE: 95 mmol/L — AB (ref 101–111)
CO2: 20 mmol/L — AB (ref 22–32)
CREATININE: 3.02 mg/dL — AB (ref 0.61–1.24)
Calcium: 6.6 mg/dL — ABNORMAL LOW (ref 8.9–10.3)
GFR calc Af Amer: 22 mL/min — ABNORMAL LOW (ref >60)
GFR, EST NON AFRICAN AMERICAN: 19 mL/min — AB (ref >60)
Glucose, Bld: 158 mg/dL — ABNORMAL HIGH (ref 65–99)
Potassium: 4.1 mmol/L (ref 3.5–5.1)
Sodium: 130 mmol/L — ABNORMAL LOW (ref 135–145)
Total Bilirubin: 2.2 mg/dL — ABNORMAL HIGH (ref 0.3–1.2)
Total Protein: 5.3 g/dL — ABNORMAL LOW (ref 6.5–8.1)

## 2016-12-01 LAB — DIFFERENTIAL
BASOS PCT: 0 %
Basophils Absolute: 0 10*3/uL (ref 0.0–0.1)
EOS PCT: 0 %
Eosinophils Absolute: 0 10*3/uL (ref 0.0–0.7)
Lymphocytes Relative: 9 %
Lymphs Abs: 1.2 10*3/uL (ref 0.7–4.0)
MONO ABS: 0.8 10*3/uL (ref 0.1–1.0)
MONOS PCT: 6 %
Neutro Abs: 11.8 10*3/uL — ABNORMAL HIGH (ref 1.7–7.7)
Neutrophils Relative %: 85 %

## 2016-12-01 LAB — GLUCOSE, CAPILLARY
GLUCOSE-CAPILLARY: 124 mg/dL — AB (ref 65–99)
GLUCOSE-CAPILLARY: 129 mg/dL — AB (ref 65–99)
Glucose-Capillary: 113 mg/dL — ABNORMAL HIGH (ref 65–99)
Glucose-Capillary: 130 mg/dL — ABNORMAL HIGH (ref 65–99)
Glucose-Capillary: 139 mg/dL — ABNORMAL HIGH (ref 65–99)
Glucose-Capillary: 155 mg/dL — ABNORMAL HIGH (ref 65–99)
Glucose-Capillary: 94 mg/dL (ref 65–99)

## 2016-12-01 LAB — BASIC METABOLIC PANEL
Anion gap: 20 — ABNORMAL HIGH (ref 5–15)
BUN: 82 mg/dL — AB (ref 6–20)
CO2: 14 mmol/L — AB (ref 22–32)
Calcium: 6.6 mg/dL — ABNORMAL LOW (ref 8.9–10.3)
Chloride: 98 mmol/L — ABNORMAL LOW (ref 101–111)
Creatinine, Ser: 2.92 mg/dL — ABNORMAL HIGH (ref 0.61–1.24)
GFR calc Af Amer: 23 mL/min — ABNORMAL LOW (ref >60)
GFR, EST NON AFRICAN AMERICAN: 20 mL/min — AB (ref >60)
GLUCOSE: 104 mg/dL — AB (ref 65–99)
Potassium: 5.5 mmol/L — ABNORMAL HIGH (ref 3.5–5.1)
Sodium: 132 mmol/L — ABNORMAL LOW (ref 135–145)

## 2016-12-01 LAB — CBC
HEMATOCRIT: 35.3 % — AB (ref 39.0–52.0)
HEMOGLOBIN: 12.2 g/dL — AB (ref 13.0–17.0)
MCH: 33.2 pg (ref 26.0–34.0)
MCHC: 34.6 g/dL (ref 30.0–36.0)
MCV: 96.2 fL (ref 78.0–100.0)
Platelets: 102 10*3/uL — ABNORMAL LOW (ref 150–400)
RBC: 3.67 MIL/uL — AB (ref 4.22–5.81)
RDW: 13.9 % (ref 11.5–15.5)
WBC: 13.8 10*3/uL — ABNORMAL HIGH (ref 4.0–10.5)

## 2016-12-01 LAB — TRIGLYCERIDES: Triglycerides: 132 mg/dL (ref <150)

## 2016-12-01 LAB — MAGNESIUM
Magnesium: 2.6 mg/dL — ABNORMAL HIGH (ref 1.7–2.4)
Magnesium: 2.7 mg/dL — ABNORMAL HIGH (ref 1.7–2.4)

## 2016-12-01 LAB — LIPASE, BLOOD: LIPASE: 20 U/L (ref 11–51)

## 2016-12-01 LAB — PHOSPHORUS: PHOSPHORUS: 3.5 mg/dL (ref 2.5–4.6)

## 2016-12-01 LAB — PROCALCITONIN: Procalcitonin: 39.98 ng/mL

## 2016-12-01 LAB — AMYLASE: Amylase: 63 U/L (ref 28–100)

## 2016-12-01 MED ORDER — SODIUM CHLORIDE 0.9 % IV SOLN
500.0000 mg | Freq: Three times a day (TID) | INTRAVENOUS | Status: AC
  Administered 2016-12-01 – 2016-12-04 (×9): 500 mg via INTRAVENOUS
  Filled 2016-12-01 (×2): qty 5
  Filled 2016-12-01: qty 3
  Filled 2016-12-01 (×6): qty 5

## 2016-12-01 MED ORDER — HEPARIN SODIUM (PORCINE) 5000 UNIT/ML IJ SOLN
5000.0000 [IU] | Freq: Three times a day (TID) | INTRAMUSCULAR | Status: DC
  Administered 2016-12-01 – 2016-12-03 (×6): 5000 [IU] via SUBCUTANEOUS
  Filled 2016-12-01 (×7): qty 1

## 2016-12-01 MED ORDER — TRACE MINERALS CR-CU-MN-SE-ZN 10-1000-500-60 MCG/ML IV SOLN
INTRAVENOUS | Status: AC
  Administered 2016-12-01: 17:00:00 via INTRAVENOUS
  Filled 2016-12-01: qty 1440

## 2016-12-01 MED ORDER — TRACE MINERALS CR-CU-MN-SE-ZN 10-1000-500-60 MCG/ML IV SOLN
INTRAVENOUS | Status: DC
  Filled 2016-12-01: qty 960

## 2016-12-01 MED ORDER — MIDAZOLAM HCL 2 MG/2ML IJ SOLN
1.0000 mg | INTRAMUSCULAR | Status: DC | PRN
  Administered 2016-12-01 – 2016-12-08 (×3): 2 mg via INTRAVENOUS
  Filled 2016-12-01 (×3): qty 2

## 2016-12-01 MED ORDER — CHLORHEXIDINE GLUCONATE 0.12 % MT SOLN
OROMUCOSAL | Status: AC
  Administered 2016-12-01: 15 mL
  Filled 2016-12-01: qty 15

## 2016-12-01 MED ORDER — FENTANYL CITRATE (PF) 100 MCG/2ML IJ SOLN
25.0000 ug | INTRAMUSCULAR | Status: DC | PRN
  Administered 2016-12-02: 50 ug via INTRAVENOUS
  Administered 2016-12-06 (×2): 25 ug via INTRAVENOUS
  Administered 2016-12-07 – 2016-12-08 (×2): 50 ug via INTRAVENOUS
  Filled 2016-12-01 (×6): qty 2

## 2016-12-01 NOTE — Progress Notes (Addendum)
PULMONARY / CRITICAL CARE MEDICINE   Name: Jacob Pittman MRN: [id number] DOB: 04/29/1945    ADMISSION DATE:  11/26/2016   CONSULTATION DATE:  11/27/2016  REFERRING MD:  Ezzard Standing  CHIEF COMPLAINT:  Abdominal Pain  HISTORY OF PRESENT ILLNESS:  71 y.o. man with ischemic cardiomyopathy, diabetic hypertensive in poor health, admitted 8/9 with gastric ulcer perforation and peritonitis. Underwent emergent laparotomy with omental patch repair He was in [place] shock requiring epinephrine and Levophed drip.   SUBJECTIVE:  No acute events overnight. Patient was more alert and interactive post-operatively but now not following commands. Heparin drip stopped w/ bleeding from op site.   REVIEW OF SYSTEMS:  Unable to obtain with patient intubated & altered mentation.   VITAL SIGNS: BP 124/83   Pulse 65   Temp (!) 97.1 F (36.2 C) (Axillary)   Resp (!) 26   Ht 5\' 8"  (1.727 m)   Wt 240 lb (108.9 kg)   SpO2 100%   BMI 36.49 kg/m   HEMODYNAMICS:    VENTILATOR SETTINGS: Vent Mode: PRVC FiO2 (%):  [30 %] 30 % Set Rate:  [25 bmp] 25 bmp Vt Set:  [550 mL] 550 mL PEEP:  [5 cmH20] 5 cmH20 Plateau Pressure:  [18 cmH20-30 cmH20] 18 cmH20  INTAKE / OUTPUT:  Intake/Output Summary (Last 24 hours) at 12/01/16 1032 Last data filed at 12/01/16 0800  Gross per 24 hour  Intake           3621.2 ml  Output             1275 ml  Net           2346.2 ml     PHYSICAL EXAMINATION: General:  No acute distress. No family at bedside.  Integument:  Warm & dry. No rash on exposed skin. Abdominal dressing clean and dry. HEENT:  Moist mucus membranes.No scleral injection or icterus. Endotracheal tube in place.  Cardiovascular:  Regular rate. No edema. No appreciable JVD.  Pulmonary:  Good aeration & clear to auscultation bilaterally. Symmetric chest wall expansion on ventilator. Abdomen: Soft. Normal bowel sounds. JP drain with serous fluid. Protuberant. Neurological: Pupils asymmetric and pinpoint.  Spontaneously moving extremities. Not following commands.  LABS:  BMET  Recent Labs Lab 11/30/16 2124 12/01/16 0004 12/01/16 0304  NA 132* 132* 130*  K 3.9 5.5* 4.1  CL 98* 98* 95*  CO2 18* 14* 20*  BUN 77* 82* 79*  CREATININE 2.98* 2.92* 3.02*  GLUCOSE 109* 104* 158*    Electrolytes  Recent Labs Lab 11/29/16 0620  11/30/16 0625 11/30/16 2124 12/01/16 0004 12/01/16 0304  CALCIUM 6.6*  < > 6.6* 5.7* 6.6* 6.6*  MG 2.3  --  2.5*  --  2.6* 2.7*  PHOS 3.3  --  5.7*  --   --  3.5  < > = values in this interval not displayed.  CBC  Recent Labs Lab 11/29/16 0620 11/30/16 0625 12/01/16 0304  WBC 15.6* 15.9* 13.8*  HGB 12.0* 12.0* 12.2*  HCT 34.5* 33.8* 35.3*  PLT 81* 80* 102*    Coag's  Recent Labs Lab 11/26/16 2315 11/30/16 1036  APTT  --  36  INR 1.23 1.35    Sepsis Markers  Recent Labs Lab 11/26/16 2342 11/27/16 0520 11/27/16 0530 11/27/16 0740  LATICACIDVEN 7.5* 3.0*  --  3.8*  PROCALCITON  --   --  60.44  --     ABG  Recent Labs Lab 11/27/16 1010 11/28/16 0315 11/29/16 0510  PHART 7.230* 7.454*  7.452*  PCO2ART 44.7 27.9* 31.0*  PO2ART 123* 110* 117*    Liver Enzymes  Recent Labs Lab 11/29/16 1030 11/30/16 2124 12/01/16 0304  AST 55* 74* 118*  ALT 35 41 88*  ALKPHOS 55 29* 69  BILITOT 1.9* 1.5* 2.2*  ALBUMIN 2.2* 1.1* 2.0*    Cardiac Enzymes  Recent Labs Lab 11/27/16 2042 11/28/16 0139 11/28/16 1206  TROPONINI 0.14* 0.17* 0.17*    Glucose  Recent Labs Lab 11/30/16 1057 11/30/16 1807 11/30/16 2043 12/01/16 0001 12/01/16 0416 12/01/16 0831  GLUCAP 152* 126* 80 113* 130* 94    Imaging No results found.  IMAGING/STUDIES: CTA CHEST/ABD/PELVIS 8/10: IMPRESSION: 1. Bowel perforation, with a large amount of free air and free fluid noted throughout the abdomen and pelvis. Tiny focus of air along the pylorus and proximal duodenum raises question for the site of perforation. This was better characterized on  subsequent surgery. 2. Vague mild soft tissue inflammation about the proximal ileum at the left mid abdomen. 3. No evidence of aortic dissection. No evidence of aneurysmal dilatation. Scattered aortic atherosclerosis. 4. No evidence of central pulmonary embolus. 5. Vague soft tissue inflammation about the bladder may be reactive in nature, or could reflect mild cystitis. 6. Cardiomegaly.  Scattered coronary artery calcification noted. 7. Trace right-sided pleural fluid. Patchy bibasilar airspace opacities likely reflect atelectasis. 8. Scattered diverticulosis along the ascending colon, without evidence of diverticulitis. 9. Borderline enlarged prostate. 10. Small to moderate bilateral inguinal hernias, containing only fat. TTE 8/12:  LV moderately dilated with EF 15% & diffuse hypokinesis. There is akinesis of the inferolateral and inferior myocardium. Study insufficient to assess diastolic function. LA & RA severely dilated. RV moderately dilated with moderately reduced systolic function. Pacer wires noted. Mild aortic regurgitation without stenosis. Aortic root normal in size. Moderate mitral regurgitation without stenosis. Trivial pulmonic regurgitation without stenosis. Moderate tricuspid regurgitation. No pericardial effusion. PORT CXR 8/13:  Personally reviewed by me. Silhouetting of left hemidiaphragm. Endotracheal tube in good position. Enteric feeding tube coursing below diaphragm. Right internal jugular central venous catheter in good position. Bilateral hilar fullness.  MICROBIOLOGY: Blood Culture x2 8/9 >>> Urine Culture 8/9:  E coli Abdominal Wound Culture 8/10:  Normal Skin Flora  MRSA PCR 8/10:  Negative  Blood Cultures x2 8/10 >>> H. Pylori IgG:  5.00 (positive)  ANTIBIOTICS: Vancomycin 8/10 (x1 dose) Zosyn 8/10 >>> Diflucan 8/10 >>>  SIGNIFICANT EVENTS: 08/10 - Admit w/ pneumoperitoneum >> taken to OR emergently for omental patch of gastric ulcer perforation 08/11 - High  grade fever to 105F. Stress-dosed steroid started. 08/12 - Stress-dosed steroids tapered to q12hr 08/13 - Transfer to [facility] for arterial embolectomy 08/14 - Off pressors. Stopped Steroids. Altered mentation that is worse post-op & without any drips. Heparin drip stopped w/ bleeding from op site.  LINES/TUBES: R BRACHIAL ART LINE 8/10 (out) OETT 8/10 >>> R IJ DL CVL 1/61 >>> ABD JP Drain 8/10 >>> Foley >>> OGT >>> PIV  ASSESSMENT / PLAN:  PULMONARY A: Acute hypoxic respiratory failure: Multifactorial.  P: Continuing daily spontaneous breathing trial/pressure-support weaning Intermittent chest x-ray imaging Intermittent Lasix as renal function allows ABG today  CARDIOVASCULAR A: Right Upper Extremity Arterial Clot:  Secondary to arterial line. S/P embolectomy w/ vein patch 8/13.  Shock:  Multifactorial. Biventricular Heart Failure H/O NICM:  S/P ICD 2014. H/O Essential Hypertension H/O Hyperlipidemia Paroxysmal Atrial Fibrillation Elevated Troponin I:  Likely multifactorial given acute illness.  H/O PAD  P: Continuous telemetry monitoring Goal MAP >65 &  SBP >90 Monitoring vitals per unit protocol Postop management per vascular surgery ASA via tube daily Stopping heparin drip due to post-op bleeding  RENAL A: Acute renal failure: Multifactorial. Hyperkalemia: Resolved. Hyponatremia: Mild.   P: Avoiding nephrotoxic agents Maintaining mean arterial pressure Trending urine output with Foley Trending electrolytes and renal function daily  GASTROINTESTINAL A: Perforated Pyloric Channel Ulcer:  S/P Repair 8/10 by Dr. Ezzard Standing. Ileus:  Post-op. Severe Protein-Calorie Malnutrition Transaminitis:  Increasing. Likely multifactorial.   P: Trending LFTs daily Checking Amylase & Lipase Checking right upper quadrant ultrasound Post-op care per Surgery Service OGT to low-intermittent suction  HEME A: Leukocytosis:  Multifactorial with sepsis contributing.  Improving. Thrombocytopenia:  Improving. Likely splenic sequestration & consumption.   P: Trending cell counts daily w/ CBC Transfusing for Hgb <7.0 or active bleeding  INFECTIOUS DISEASE A: Septic Shock Peritonitis:  Secondary to perforated ulcer. E coli UTI  P: Empiric Zosyn & Diflucan  Awaiting finalization of cultures  Trending Procalcitonin per algorithm   ENDOCRINE A: Diabetes Mellitus:  Glucose fluctuating. Hypoglycemia:  On 8/11.   P: Accu-Checks q4hr SSI per Moderate Algorithm   NEUROLOGIC A: Sedation on Ventilator Acute Encephalopathy: Acutely worse postsurgical.  H/O anxiety  H/O right hip pain & osteoarthritis H/O EtOH Use  P: Desired RASS:  0 to -1 Fentanyl IV prn Versed IV prn  Thiamine 500mg  IV q8hr x3 days Checking CT head without contrast stat  Prophylaxis:  SCDs, heparin Colby q8hr, & Protonix IV q12hr. Diet:  NPO. Continuing TPN.  Code Status:  Full Code per previous physician discussions. Disposition:  Patient remains critically ill in the ICU.  Family Update:  No family at bedside at the time of my exam.   DISCUSSION:  71 y.o. male with multiple medical problems including peritonitis secondary to perforated gastric ulcer and now arterial thrombus post embolectomy in right upper extremity. Altered mentation is concerning given patient was on systemic anticoagulation. Checking CT head without contrast stat. Also working out patient's transaminitis.  I have spent a total of 35 minutes of critical care time today caring for the patient and reviewing the patient's electronic medical record.   Donna Christen Jamison Neighbor, M.D. [facility] Pulmonary & Critical Care Pager:  [phone] After 3pm or if no response, call [phone] 10:32 AM 12/01/16

## 2016-12-01 NOTE — Progress Notes (Signed)
ANTICOAGULATION CONSULT NOTE - Follow-Up Consult  Pharmacy Consult for Heparin Indication: atrial fibrillation, ischemic limb  No Known Allergies  Patient Measurements: Height: '5\' 8"'$  (172.7 cm) Weight: 240 lb (108.9 kg) IBW/kg (Calculated) : 68.4 Heparin Dosing Weight: 89.6 kg  Vital Signs: Temp: 97.1 F (36.2 C) (08/14 0838) Temp Source: Axillary (08/14 0838) BP: 124/83 (08/14 0900) Pulse Rate: 65 (08/14 0900)  Labs:  Recent Labs  11/28/16 1206  11/29/16 0620  11/30/16 0625 11/30/16 1036 11/30/16 2124 12/01/16 0004 12/01/16 0304  HGB  --   < > 12.0*  --  12.0*  --   --   --  12.2*  HCT  --   --  34.5*  --  33.8*  --   --   --  35.3*  PLT  --   --  81*  --  80*  --   --   --  102*  APTT  --   --   --   --   --  36  --   --   --   LABPROT  --   --   --   --   --  16.8*  --   --   --   INR  --   --   --   --   --  1.35  --   --   --   HEPARINUNFRC  --   --   --   --   --   --  0.23*  --   --   CREATININE  --   --  2.32*  < > 2.73*  --  2.98* 2.92* 3.02*  TROPONINI 0.17*  --   --   --   --   --   --   --   --   < > = values in this interval not displayed.  Estimated Creatinine Clearance: 26.1 mL/min (A) (by C-G formula based on SCr of 3.02 mg/dL (H)).   Medical History: Past Medical History:  Diagnosis Date  . ANXIETY 02/04/2009  . CHF (congestive heart failure) ([place])   . Chronic systolic CHF (congestive heart failure) ([place])   . DIABETES MELLITUS, TYPE II 11/07/2008  . 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 HIP PAIN, RIGHT 11/06/2009  . HYPERLIPIDEMIA 11/07/2008  . HYPERTENSION 10/08/2008  . Nonischemic cardiomyopathy [facility])    s/p ICD implantation 03-2013 by Dr Lovena Le  . OSTEOARTHRITIS, KNEE, RIGHT 10/08/2008  . Peripheral arterial disease (H[facility])     Medications:  Scheduled:  . aspirin  81 mg Per Tube Daily  . chlorhexidine gluconate (MEDLINE KIT)  15 mL Mouth Rinse BID  . Chlorhexidine Gluconate Cloth  6 each Topical Daily  . hydrocortisone sod succinate  (SOLU-CORTEF) inj  50 mg Intravenous Q12H  . insulin aspart  0-20 Units Subcutaneous Q4H  . mouth rinse  15 mL Mouth Rinse QID  . pantoprazole (PROTONIX) IV  40 mg Intravenous Q12H  . sodium chloride flush  10-40 mL Intracatheter Q12H   Infusions:  . sodium chloride 250 mL (11/30/16 1800)  . sodium chloride 10 mL/hr at 11/30/16 1500  . fluconazole (DIFLUCAN) IV 200 mg (12/01/16 0945)  . norepinephrine (LEVOPHED) Adult infusion Stopped (12/01/16 0730)  . piperacillin-tazobactam (ZOSYN)  IV 3.375 g (12/01/16 0744)  . TPN (CLINIMIX) Adult without lytes 40 mL/hr at 11/30/16 1955   PRN: sodium chloride, acetaminophen, artificial tears, fentaNYL (SUBLIMAZE) injection, fentaNYL (SUBLIMAZE) injection, ondansetron **OR** ondansetron (ZOFRAN) IV, sodium chloride flush  Assessment: 71 yo  male admitted 8/9 with gastric ulcer perforation and peritonitis. Underwent emergent laparotomy with omental patch repair. Post op course complicated by multiorgan failure requiring intubation and pressors. On 8/13, Pharmacy consulted to dose IV heparin for afib and presumed ischemic right upper extremity. CCM aware of thrombocytopenia.  Pt now s/p brachial embolectomy 8/13 and found to have bleeding from incision site and IV site - heparin held overnight per VVS. This morning CBC stable but bleeding continues, will hold heparin another 24 hours per VVS and reassess in morning.  Goal of Therapy:  Heparin level 0.3-0.7 units/ml Monitor platelets by anticoagulation protocol: Yes   Plan:  -Continue to hold heparin per VVS -Monitor S/Sx bleeding, CBC closely -F/U plans to resume anticoagulation  Arrie Senate, PharmD PGY-2 Cardiology Pharmacy Resident Pager: [phone] 12/01/2016

## 2016-12-01 NOTE — Progress Notes (Signed)
Dr. Delton Coombes paged. Updated of pt condition. Clarified order of heparin SQ. Informed that pt had increased bleeding from incision site from previous shift and that pt's heparin gtt had to be discontinued. New order received. Per MD, hold current dose of heparin SQ.

## 2016-12-01 NOTE — Progress Notes (Signed)
Pt bleeding from multiple sites.  Surgical dressing on right arm fully saturated and leaking into bed.    Surgeon notified.  Instructed to stop heparin infusion and redress site with 4x4 gauze and wrap with Kerlix.    Dressing changed at 2200. Will continue to monitor closely.  Frutoso Chase, RN

## 2016-12-01 NOTE — Progress Notes (Signed)
[facility] Progress Note  1 Day Post-Op  Subjective: CC:  On the ventilator. Grimaces to sternal rub.  Per nurse- one BM last night, one this AM.  TMAX 101.2 Objective: Vital signs in last 24 hours: Temp:  [97.7 F (36.5 C)-101.2 F (38.4 C)] 101.2 F (38.4 C) (08/14 0330) Pulse Rate:  [25-87] 77 (08/14 0330) Resp:  [19-35] 26 (08/14 0330) BP: (79-137)/(55-109) 123/87 (08/14 0330) SpO2:  [97 %-100 %] 100 % (08/14 0330) FiO2 (%):  [30 %] 30 % (08/14 0330) Last BM Date: 11/30/16  Intake/Output from previous day: 08/13 0701 - 08/14 0700 In: 3236 [I.V.:2826; NG/GT:60; IV Piggyback:350] Out: 720 [Urine:450; Emesis/NG output:200; Drains:20; Blood:50] Intake/Output this shift: No intake/output data recorded.  PE: Gen:  Alert, NAD, pleasant Card:  Regular rate and rhythm, pedal pulses 2+ BL Pulm:  Normal effort, clear to auscultation bilaterally Abd: Soft, non-tender, distended, hypoactive bowel sounds midline incision as below:    NGT: 200 cc/24h   JP Drain: 20 cc/24h  GU: UOP 450 cc Skin: warm and dry, no rashes  Psych: A&Ox3   Lab Results:   Recent Labs  11/30/16 0625 12/01/16 0304  WBC 15.9* 13.8*  HGB 12.0* 12.2*  HCT 33.8* 35.3*  PLT 80* 102*   BMET  Recent Labs  12/01/16 0004 12/01/16 0304  NA 132* 130*  K 5.5* 4.1  CL 98* 95*  CO2 14* 20*  GLUCOSE 104* 158*  BUN 82* 79*  CREATININE 2.92* 3.02*  CALCIUM 6.6* 6.6*   PT/INR  Recent Labs  11/30/16 1036  LABPROT 16.8*  INR 1.35   CMP     Component Value Date/Time   NA 130 (L) 12/01/2016 0304   K 4.1 12/01/2016 0304   CL 95 (L) 12/01/2016 0304   CO2 20 (L) 12/01/2016 0304   GLUCOSE 158 (H) 12/01/2016 0304   BUN 79 (H) 12/01/2016 0304   CREATININE 3.02 (H) 12/01/2016 0304   CREATININE 0.91 03/04/2015 1618   CALCIUM 6.6 (L) 12/01/2016 0304   PROT 5.3 (L) 12/01/2016 0304   ALBUMIN 2.0 (L) 12/01/2016 0304   AST 118 (H) 12/01/2016 0304   ALT 88 (H) 12/01/2016 0304   ALKPHOS 69 12/01/2016 0304   BILITOT 2.2 (H) 12/01/2016 0304   GFRNONAA 19 (L) 12/01/2016 0304   GFRAA 22 (L) 12/01/2016 0304   Lipase     Component Value Date/Time   LIPASE 59 (H) 11/27/2016 0530   Studies/Results: Dg Chest Port 1 View  Result Date: 11/30/2016 CLINICAL DATA:  Acute respiratory failure EXAM: PORTABLE CHEST 1 VIEW COMPARISON:  Chest radiograph from one day prior. FINDINGS: Endotracheal tube tip is 4.5 cm above the carina. Single lead left subclavian ICD and right internal jugular central venous catheter are stable in configuration. Enteric tube enters stomach with the tip not seen on this image. Stable cardiomediastinal silhouette with mild cardiomegaly. No pneumothorax. Stable small right pleural effusion. Stable mild pulmonary edema and mild hazy bibasilar lung opacities. IMPRESSION: 1. Well-positioned support structures . 2. Stable mild congestive heart failure. 3. Stable small right pleural effusion. 4. Stable mild hazy bibasilar lung opacities, favor atelectasis. Electronically Signed   By: Delbert Phenix M.D.   On: 11/30/2016 07:06    Anti-infectives: Anti-infectives    Start     Dose/Rate Route Frequency Ordered Stop   11/28/16 0800  fluconazole (DIFLUCAN) IVPB 200 mg     200 mg 100 mL/hr over 60 Minutes Intravenous Every 24 hours 11/27/16 0624     11/27/16  0630  fluconazole (DIFLUCAN) IVPB 400 mg     400 mg 100 mL/hr over 120 Minutes Intravenous  Once 11/27/16 0622 11/27/16 0841   11/27/16 0600  piperacillin-tazobactam (ZOSYN) IVPB 3.375 g     3.375 g 12.5 mL/hr over 240 Minutes Intravenous Every 8 hours 11/27/16 0534     11/27/16 0515  piperacillin-tazobactam (ZOSYN) IVPB 3.375 g  Status:  Discontinued     3.375 g 100 mL/hr over 30 Minutes Intravenous  Once 11/27/16 0510 11/27/16 0518   11/27/16 0030  piperacillin-tazobactam (ZOSYN) IVPB 3.375 g     3.375 g 100 mL/hr over 30 Minutes Intravenous  Once 11/27/16 0016 11/27/16 0755   11/27/16 0030  vancomycin  (VANCOCIN) IVPB 1000 mg/200 mL premix     1,000 mg 200 mL/hr over 60 Minutes Intravenous  Once 11/27/16 0016 11/27/16 0756     Assessment/Plan Acute ischemia right hand s/p R brachial ulnar and radial artery ebolectomy Septic shock 2/2 perf gastric ulcer - cont abx, on Levo Acute hypoxic respiratory failure - vent mgmt per CCM Hx PAD, sCHD, NICM s/p ICD placement 2014 HTN HLD AKI DM Acute encephalopathy - off sedation  Perforated pyloric ulcer  POD#4 S/P EXPLORATORY LAPAROTOMY, GRAM PATCH REPAIR 11/27/16 Dr. Ovidio Kin - bowel function returning - 2 BMs, decreasing NGT output. Drain output clear - may start tube feeds via NG - continue BID wet to dry dressing changes  - continue Zosyn, diflucan - continue IV Protonix  FEN: NPO, IVF, TNA, NGT - tube feeds, hyponatremia (130), hypomagnesemia (2.7)  ID: Fluconazole 8/10 >>, Zosyn 8/10 >>  VTE: SCD's, heparin  Dispo: UGI    LOS: 4 days    Adam Phenix , [facility] 12/01/2016, 7:30 AM Pager: [phone] Consults: [phone] Mon-Fri 7:00 am-4:30 pm Sat-Sun 7:00 am-11:30 am  Wilmon Arms. Corliss Skains, MD, [facility]  General/ Trauma Surgery  12/01/2016 1:19 PM

## 2016-12-01 NOTE — Progress Notes (Signed)
Pt still bleeding.  Surgical dressing fully saturated a second time.  Dressing removed at 0530, site cleaned with betadine, and site redressed with 4x4 guaze and Kerlix (per MD previous orders).    RUE pulses (brachial, radial, and ulnar) can be dopplered, however fingertips and nail beds (mostly thumb and pointer) are duskier than previously noted.  Will continue to monitor closely.  Frutoso Chase, RN

## 2016-12-01 NOTE — Progress Notes (Signed)
PHARMACY - ADULT TOTAL PARENTERAL NUTRITION CONSULT NOTE   Pharmacy Consult for TPN Indication: Prolonged Ileus  Patient Measurements: Height: 5\' 8"  (172.7 cm) Weight: 240 lb (108.9 kg) IBW/kg (Calculated) : 68.4 TPN AdjBW (KG): 75.4 Body mass index is 36.49 kg/m.  Assessment:  71 yo M presents with gastric ulcer perforation and peritonitis. PMH includes ischemic cardiomyopathy, DM, HTN, CHF, HLD, PVD. He underwent emergent ex-lap with omental patch repair 8/10. Had multi-organ failure post-op requiring intubation and pressors. Acute ischemia of R hand on 8/13 requiring transfer to [facility] for R brachial ulner/radial artery embolectomy. Now with significant ileus. Pharmacy consulted to start TPN. Per Nutrition note, pt usually has good appetite, starting having vomiting w/ any intake 2-3 days PTA.  GI: NG tube output at yesterday. Is producing stool. Albumin low at 2. Prealbumin low at 6.5. Last BM 8/13. PPI Endo: CBGs improving (80-158). Tapering hydrocortisone, on resistant scale SSI. D10 d/c'd 8/13 PM Insulin requirements in the past 24 hours: 7 units Lytes: K up 3.9>4.1 s/p 6 K runs, KCl x 1 (goal>/=4 w/ ileus), Na/Cl low, decreased a bit. Mg up 2.7, Phos decreased 5.7>3.5. CoCa 8.1 *TPN w/ no lytes 8/13 Renal: SCr elevated, increased to 3.02, CrCl ~93ml/min. UOP low at 0.2 ml/kg/hr. BUN elevated and rising to 79. NS at [facility] Pulm: Intubated on 30% of FiO2 Cards: Heparin gtt for Afib and ischemic limb on hold overnight w/ bleeding from incision/IV site. Frequent PVCs 8/13. BP improved - Pressors off. Asa, lasix x 1 8/13 Hepatobil: Tbili elevated, trend up 2.2. LFTs mildly elevated, trending up - AST/ALT 118/88, TG 132 Neuro: Fentanyl prn, no sedation. GCS 8. RASS -1 (goal 0), CPOT 3-6 ID: Zosyn and fluconazole D5 for bowel perforation, Ecoli UTI. Tmax/24h 101.2, WBC down 13.8  Best Practices: Heparin gtt, MC, ppi TPN Access: Double lumen RIJ >> TPN start date: 8/13  >>  Nutritional Goals (per RD recommendation on 8/13): KCal: 1000-1270  Protein: 140 g  Current Nutrition:  NPO Clinimix  Plan:  -Increase Clinimix 5/15 (no lytes) to 14ml/hr. Will not be able to meet nutritional needs with Clinimix. Will provide 72g protein and 1022 kcal in 24hr period. -Hold 20% lipid emulsion for first 7 days for ICU patients per ASPEN guidelines (Start date 8/20) -Continue NS at 27ml/hr per MD -Add MVI and trace elements in TPN -Continue resistant SSI and adjust as needed -Monitor TPN labs, f/u clinical improvement   Babs Bertin, PharmD, BCPS Clinical Pharmacist Rx Phone # for today: [phone] After 3:30PM, please call Main Rx: [phone] 12/01/2016 8:31 AM

## 2016-12-01 NOTE — Progress Notes (Addendum)
  Progress Note    12/01/2016 8:28 AM 1 Day Post-Op  Subjective:  Intubated and sedated  Gtts:   Levophed TPN  Vitals:   12/01/16 0329 12/01/16 0330  BP:  123/87  Pulse:  77  Resp:  (!) 26  Temp:  (!) 101.2 F (38.4 C)  SpO2: 100% 100%    Physical Exam: Cardiac:  irregular Lungs:  Intubated .[id number] Incisions:  Bandage is clean and dry Extremities:  +doppler signal right radial and +faint doppler signal in the right palmar arch; finger tips are discolored on the right   CBC    Component Value Date/Time   WBC 13.8 (H) 12/01/2016 0304   RBC 3.67 (L) 12/01/2016 0304   HGB 12.2 (L) 12/01/2016 0304   HCT 35.3 (L) 12/01/2016 0304   PLT 102 (L) 12/01/2016 0304   MCV 96.2 12/01/2016 0304   MCH 33.2 12/01/2016 0304   MCHC 34.6 12/01/2016 0304   RDW 13.9 12/01/2016 0304   LYMPHSABS 1.2 12/01/2016 0304   MONOABS 0.8 12/01/2016 0304   EOSABS 0.0 12/01/2016 0304   BASOSABS 0.0 12/01/2016 0304    BMET    Component Value Date/Time   NA 130 (L) 12/01/2016 0304   K 4.1 12/01/2016 0304   CL 95 (L) 12/01/2016 0304   CO2 20 (L) 12/01/2016 0304   GLUCOSE 158 (H) 12/01/2016 0304   BUN 79 (H) 12/01/2016 0304   CREATININE 3.02 (H) 12/01/2016 0304   CREATININE 0.91 03/04/2015 1618   CALCIUM 6.6 (L) 12/01/2016 0304   GFRNONAA 19 (L) 12/01/2016 0304   GFRAA 22 (L) 12/01/2016 0304    INR    Component Value Date/Time   INR 1.35 11/30/2016 1036     Intake/Output Summary (Last 24 hours) at 12/01/16 9390 Last data filed at 12/01/16 0300  Gross per 24 hour  Intake          3235.98 ml  Output              720 ml  Net          2515.98 ml     Assessment:  71 y.o. male is s/p:  Right brachial ulnar and radial artery embolectomy  1 Day Post-Op  Plan: -pt sedated and intubated -+doppler signal right radial and +faint doppler signal in the right palmar arch; finger tips are discolored on the right -will d/w Dr. Darrick Penna when to restart heparin    Doreatha Massed,  PA-C [facility] [phone] 12/01/2016 8:28 AM  Has radial doppler and superficial palmar arch doppler in right hand.  Still dusky and cool.  Left hand also dusky and cool with doppler signals but better cap refill.  Now has no doppler signals in left foot which is also cool. Has right foot DP doppler. Pt currently critically ill now with poor perfusion of right hand and left foot.  Off all pressors currently.  Not a candidate for arteriogram or any further revascularization currently due to overall poor medical condition.  Would hold heparin a full 24 hrs in light of arm bleeding episode yesterday.  Ok to restart heparin tomorrow morning if no further bleeding.  Fabienne Bruns, MD [facility] [place] Office: [phone] Pager: [phone]

## 2016-12-01 NOTE — Progress Notes (Signed)
CRITICAL VALUE ALERT  Critical Value:  Calcium 5.7  Date & Time Notied:  11/30/16 @ 2225  Provider Notified: Pola Corn @ 2235  Orders Received/Actions taken: Labs to be drawn STAT

## 2016-12-02 ENCOUNTER — Inpatient Hospital Stay (HOSPITAL_COMMUNITY): Payer: Medicare PPO

## 2016-12-02 DIAGNOSIS — I63441 Cerebral infarction due to embolism of right cerebellar artery: Secondary | ICD-10-CM

## 2016-12-02 LAB — GLUCOSE, CAPILLARY
GLUCOSE-CAPILLARY: 143 mg/dL — AB (ref 65–99)
GLUCOSE-CAPILLARY: 160 mg/dL — AB (ref 65–99)
GLUCOSE-CAPILLARY: 261 mg/dL — AB (ref 65–99)
Glucose-Capillary: 201 mg/dL — ABNORMAL HIGH (ref 65–99)
Glucose-Capillary: 229 mg/dL — ABNORMAL HIGH (ref 65–99)
Glucose-Capillary: 242 mg/dL — ABNORMAL HIGH (ref 65–99)

## 2016-12-02 LAB — COMPREHENSIVE METABOLIC PANEL
ALT: 109 U/L — AB (ref 17–63)
ANION GAP: 14 (ref 5–15)
AST: 173 U/L — ABNORMAL HIGH (ref 15–41)
Albumin: 1.8 g/dL — ABNORMAL LOW (ref 3.5–5.0)
Alkaline Phosphatase: 49 U/L (ref 38–126)
BUN: 85 mg/dL — ABNORMAL HIGH (ref 6–20)
CHLORIDE: 95 mmol/L — AB (ref 101–111)
CO2: 22 mmol/L (ref 22–32)
CREATININE: 2.61 mg/dL — AB (ref 0.61–1.24)
Calcium: 6.5 mg/dL — ABNORMAL LOW (ref 8.9–10.3)
GFR, EST AFRICAN AMERICAN: 26 mL/min — AB (ref >60)
GFR, EST NON AFRICAN AMERICAN: 23 mL/min — AB (ref >60)
Glucose, Bld: 142 mg/dL — ABNORMAL HIGH (ref 65–99)
Potassium: 2.3 mmol/L — CL (ref 3.5–5.1)
Sodium: 131 mmol/L — ABNORMAL LOW (ref 135–145)
Total Bilirubin: 2.3 mg/dL — ABNORMAL HIGH (ref 0.3–1.2)
Total Protein: 4.7 g/dL — ABNORMAL LOW (ref 6.5–8.1)

## 2016-12-02 LAB — CULTURE, BLOOD (ROUTINE X 2)
CULTURE: NO GROWTH
CULTURE: NO GROWTH
Culture: NO GROWTH
Culture: NO GROWTH
SPECIAL REQUESTS: ADEQUATE
SPECIAL REQUESTS: ADEQUATE
Special Requests: ADEQUATE
Special Requests: ADEQUATE

## 2016-12-02 LAB — CBC WITH DIFFERENTIAL/PLATELET
BASOS PCT: 1 %
Basophils Absolute: 0.1 10*3/uL (ref 0.0–0.1)
EOS PCT: 0 %
Eosinophils Absolute: 0 10*3/uL (ref 0.0–0.7)
HCT: 30.2 % — ABNORMAL LOW (ref 39.0–52.0)
Hemoglobin: 10.6 g/dL — ABNORMAL LOW (ref 13.0–17.0)
LYMPHS ABS: 1.5 10*3/uL (ref 0.7–4.0)
Lymphocytes Relative: 12 %
MCH: 33.2 pg (ref 26.0–34.0)
MCHC: 35.1 g/dL (ref 30.0–36.0)
MCV: 94.7 fL (ref 78.0–100.0)
MONO ABS: 0.6 10*3/uL (ref 0.1–1.0)
Monocytes Relative: 5 %
NEUTROS ABS: 10.4 10*3/uL — AB (ref 1.7–7.7)
Neutrophils Relative %: 82 %
PLATELETS: 93 10*3/uL — AB (ref 150–400)
RBC: 3.19 MIL/uL — ABNORMAL LOW (ref 4.22–5.81)
RDW: 13.6 % (ref 11.5–15.5)
WBC: 12.6 10*3/uL — ABNORMAL HIGH (ref 4.0–10.5)

## 2016-12-02 LAB — ACETAMINOPHEN LEVEL: Acetaminophen (Tylenol), Serum: <10 ug/mL — ABNORMAL LOW (ref 10–30)

## 2016-12-02 LAB — RENAL FUNCTION PANEL
ALBUMIN: 1.7 g/dL — AB (ref 3.5–5.0)
Anion gap: 12 (ref 5–15)
BUN: 73 mg/dL — AB (ref 6–20)
CALCIUM: 6.3 mg/dL — AB (ref 8.9–10.3)
CO2: 22 mmol/L (ref 22–32)
CREATININE: 2.17 mg/dL — AB (ref 0.61–1.24)
Chloride: 96 mmol/L — ABNORMAL LOW (ref 101–111)
GFR calc Af Amer: 33 mL/min — ABNORMAL LOW (ref >60)
GFR, EST NON AFRICAN AMERICAN: 28 mL/min — AB (ref >60)
Glucose, Bld: 262 mg/dL — ABNORMAL HIGH (ref 65–99)
PHOSPHORUS: 4.7 mg/dL — AB (ref 2.5–4.6)
POTASSIUM: 3.6 mmol/L (ref 3.5–5.1)
SODIUM: 130 mmol/L — AB (ref 135–145)

## 2016-12-02 LAB — PREALBUMIN

## 2016-12-02 LAB — TRIGLYCERIDES: Triglycerides: 154 mg/dL — ABNORMAL HIGH (ref <150)

## 2016-12-02 LAB — MAGNESIUM: MAGNESIUM: 2.4 mg/dL (ref 1.7–2.4)

## 2016-12-02 LAB — PROCALCITONIN: PROCALCITONIN: 30.83 ng/mL

## 2016-12-02 LAB — PHOSPHORUS: PHOSPHORUS: 2.4 mg/dL — AB (ref 2.5–4.6)

## 2016-12-02 MED ORDER — METRONIDAZOLE 50 MG/ML ORAL SUSPENSION: 250.0000 mg | Freq: Four times a day (QID) | ORAL | Status: DC

## 2016-12-02 MED ORDER — DEXTROSE 5 % IV SOLN
40.0000 mmol | Freq: Once | INTRAVENOUS | Status: AC
  Administered 2016-12-02: 40 mmol via INTRAVENOUS
  Filled 2016-12-02: qty 13.33

## 2016-12-02 MED ORDER — POTASSIUM CHLORIDE 10 MEQ/50ML IV SOLN
10.0000 meq | INTRAVENOUS | Status: AC
  Administered 2016-12-02 (×3): 10 meq via INTRAVENOUS
  Filled 2016-12-02 (×3): qty 50

## 2016-12-02 MED ORDER — METRONIDAZOLE 50 MG/ML ORAL SUSPENSION
500.0000 mg | Freq: Three times a day (TID) | ORAL | Status: DC
  Administered 2016-12-02 – 2016-12-04 (×7): 500 mg
  Filled 2016-12-02 (×9): qty 10

## 2016-12-02 MED ORDER — VITAL HIGH PROTEIN PO LIQD
1000.0000 mL | ORAL | Status: DC
  Administered 2016-12-02: 1000 mL

## 2016-12-02 MED ORDER — POTASSIUM CHLORIDE 10 MEQ/50ML IV SOLN
10.0000 meq | INTRAVENOUS | Status: AC
  Administered 2016-12-02: 10 meq via INTRAVENOUS
  Filled 2016-12-02: qty 50

## 2016-12-02 MED ORDER — POTASSIUM CHLORIDE 10 MEQ/50ML IV SOLN
10.0000 meq | INTRAVENOUS | Status: AC
  Administered 2016-12-02 (×5): 10 meq via INTRAVENOUS
  Filled 2016-12-02 (×5): qty 50

## 2016-12-02 MED ORDER — TRACE MINERALS CR-CU-MN-SE-ZN 10-1000-500-60 MCG/ML IV SOLN
INTRAVENOUS | Status: AC
  Administered 2016-12-02: 18:00:00 via INTRAVENOUS
  Filled 2016-12-02: qty 1440

## 2016-12-02 MED ORDER — SODIUM CHLORIDE 0.9 % IV SOLN
1.0000 g | Freq: Once | INTRAVENOUS | Status: AC
  Administered 2016-12-02: 1 g via INTRAVENOUS
  Filled 2016-12-02: qty 10

## 2016-12-02 MED ORDER — CLARITHROMYCIN 250 MG/5ML PO SUSR
500.0000 mg | Freq: Two times a day (BID) | ORAL | Status: DC
  Administered 2016-12-02 – 2016-12-04 (×5): 500 mg
  Filled 2016-12-02 (×5): qty 10

## 2016-12-02 NOTE — Progress Notes (Signed)
Nutrition Follow-up  DOCUMENTATION CODES:   Obesity unspecified  INTERVENTION:   Recommend advance Vital High Protein to 30 ml/hr with 60 ml Prostat TID Provides: 1320 kcal, 153 grams protein, and 601 ml free water.    NUTRITION DIAGNOSIS:   Inadequate oral intake related to inability to eat as evidenced by NPO status. Ongoing.   GOAL:   Provide needs based on ASPEN/SCCM guidelines Progressing.   MONITOR:   TF tolerance, Labs  REASON FOR ASSESSMENT:   Consult Enteral/tube feeding initiation and management  ASSESSMENT:   71 y.o.M with CHF (EF 15%), HTN, DM, PAD, and OA. Pt admitted with peritonitis and septic shock secondary to perforated ulcer s/p exp lap oversew pyloric channel ulcer, graham omental patch repair for perforated ulcer. Pt transferred to [place] for brachial embolectomy 8/13.  Pt remains on vent support. Spoke with RN.  Surgery initiated trickle feedings today which he has tolerated well.   TPN: Clinimix E 5/15 with lytes @ 60 ml/hr Provides: 1440 ml, 1022 kcal, and 72 grams protein  Labs reviewed: Na 131 (L), K+ 2.3 (L), PO4 2.4 (L), TG 154 (H) Pt receiving multiple runs of potassium   Diet Order:  Diet NPO time specified TPN (CLINIMIX) Adult without lytes .TPN (CLINIMIX-E) Adult  Skin:  Wound (see comment) (Abdominal incision from 8/10)  Last BM:  8/15 large type 5  Height:   Ht Readings from Last 1 Encounters:  12/02/16 5\' 8"  (1.727 m)    Weight:   Wt Readings from Last 1 Encounters:  12/02/16 217 lb 6 oz (98.6 kg)    Ideal Body Weight:  70 kg  BMI:  Body mass index is 33.05 kg/m.  Estimated Nutritional Needs:   Kcal:  1000-1270 (11-14 kcal/kg)  Protein:  140 grams (2 grams/kg IBW)  Fluid:  >/= 1.5 L/day  EDUCATION NEEDS:   No education needs identified at this time  Kendell Bane RD, LDN, CNSC [phone] Pager [phone] After Hours Pager

## 2016-12-02 NOTE — Progress Notes (Signed)
PULMONARY / CRITICAL CARE MEDICINE   Name: Jacob Pittman MRN: [id number] DOB: [birth year]    ADMISSION DATE:  11/26/2016   CONSULTATION DATE:  11/27/2016  REFERRING MD:  Ezzard Standing  CHIEF COMPLAINT:  Abdominal Pain  HISTORY OF PRESENT ILLNESS:  71 y.o. man with ischemic cardiomyopathy, diabetic hypertensive in poor health, admitted 8/9 with gastric ulcer perforation and peritonitis. Underwent emergent laparotomy with omental patch repair He was in [place] shock requiring epinephrine and Levophed drip.   SUBJECTIVE:  No acute events overnight. Patient having bowel movements today and flatus.  REVIEW OF SYSTEMS:  Unable to obtain with patient intubated.   VITAL SIGNS: BP (!) 83/61 (BP Location: Right Leg)   Pulse 64   Temp 97.8 F (36.6 C) (Axillary)   Resp (!) 25   Ht 5\' 8"  (1.727 m)   Wt 217 lb 6 oz (98.6 kg)   SpO2 100%   BMI 33.05 kg/m   HEMODYNAMICS:    VENTILATOR SETTINGS: Vent Mode: CPAP;PSV FiO2 (%):  [30 %] 30 % Set Rate:  [25 bmp] 25 bmp Vt Set:  [550 mL] 550 mL PEEP:  [5 cmH20] 5 cmH20 Pressure Support:  [10 cmH20] 10 cmH20 Plateau Pressure:  [25 cmH20-27 cmH20] 25 cmH20  INTAKE / OUTPUT:  Intake/Output Summary (Last 24 hours) at 12/02/16 1001 Last data filed at 12/02/16 0800  Gross per 24 hour  Intake          1684.33 ml  Output             3470 ml  Net         -1785.67 ml     PHYSICAL EXAMINATION: General:  Daughter at bedside. Awake. No distress. Integument:  Warm. Dry. Right arm incision clean, dry, and intact. HEENT:  Endotracheal tube in place. No scleral icterus. No scleral injection. Cardiovascular:  Regular rate. No edema in lower extremities. No appreciable JVD. Pulmonary: Clear bilaterally on auscultation. Symmetric chest wall rise on ventilator. Abdomen: JVD drain in place. Soft. Normal bowel sounds. Protuberant. Neurological: Nods to questions. Following commands. Moving all 4 extremities.  LABS:  BMET  Recent Labs Lab 12/01/16 0004  12/01/16 0304 12/02/16 0400  NA 132* 130* 131*  K 5.5* 4.1 2.3*  CL 98* 95* 95*  CO2 14* 20* 22  BUN 82* 79* 85*  CREATININE 2.92* 3.02* 2.61*  GLUCOSE 104* 158* 142*    Electrolytes  Recent Labs Lab 11/30/16 0625  12/01/16 0004 12/01/16 0304 12/02/16 0400  CALCIUM 6.6*  < > 6.6* 6.6* 6.5*  MG 2.5*  --  2.6* 2.7* 2.4  PHOS 5.7*  --   --  3.5 2.4*  < > = values in this interval not displayed.  CBC  Recent Labs Lab 11/30/16 0625 12/01/16 0304 12/02/16 0400  WBC 15.9* 13.8* 12.6*  HGB 12.0* 12.2* 10.6*  HCT 33.8* 35.3* 30.2*  PLT 80* 102* 93*    Coag's  Recent Labs Lab 11/26/16 2315 11/30/16 1036  APTT  --  36  INR 1.23 1.35    Sepsis Markers  Recent Labs Lab 11/26/16 2342 11/27/16 0520 11/27/16 0530 11/27/16 0740 12/01/16 0304 12/02/16 0400  LATICACIDVEN 7.5* 3.0*  --  3.8*  --   --   PROCALCITON  --   --  60.44  --  39.98 30.83    ABG  Recent Labs Lab 11/27/16 1010 11/28/16 0315 11/29/16 0510  PHART 7.230* 7.454* 7.452*  PCO2ART 44.7 27.9* 31.0*  PO2ART 123* 110* 117*    Liver Enzymes  Recent Labs Lab 11/30/16 2124 12/01/16 0304 12/02/16 0400  AST 74* 118* 173*  ALT 41 88* 109*  ALKPHOS 29* 69 49  BILITOT 1.5* 2.2* 2.3*  ALBUMIN 1.1* 2.0* 1.8*    Cardiac Enzymes  Recent Labs Lab 11/27/16 2042 11/28/16 0139 11/28/16 1206  TROPONINI 0.14* 0.17* 0.17*    Glucose  Recent Labs Lab 12/01/16 1120 12/01/16 1610 12/01/16 1932 12/01/16 2334 12/02/16 0341 12/02/16 0736  GLUCAP 124* 129* 155* 139* 143* 160*    Imaging Ct Head Wo Contrast  Result Date: 12/01/2016 CLINICAL DATA:  Altered level of consciousness. Recent episode of hypertension. EXAM: CT HEAD WITHOUT CONTRAST TECHNIQUE: Contiguous axial images were obtained from the base of the skull through the vertex without intravenous contrast. COMPARISON:  June 10, 2010 FINDINGS: Brain: There is mild diffuse atrophy. There is no intracranial mass, hemorrhage,  extra-axial fluid collection, or midline shift. There is decreased attenuation involving the dentate nucleus of the right cerebellum and immediately adjacent cerebellar tissue, a finding concerning for recent infarct in the right mid cerebellar region. No other potential acute/recent infarct is demonstrated on this study. There is small vessel disease throughout the centra semiovale bilaterally, present on prior study. Vascular: No hyperdense vessel. There is calcification in both carotid siphon regions and more subtly in each middle cerebral artery. There is also calcification in the distal vertebral arteries. Skull: Bony calvarium appears intact. Sinuses/Orbits: There is mucosal thickening in the maxillary antra, more on the left than on the right, with a probable retention cyst in the inferior posterior left maxillary antrum. There is mucosal thickening in several ethmoid air cells bilaterally. Other paranasal sinuses appear clear. Orbits appear symmetric bilaterally. Other: There is mucosal thickening in several mastoid air cells on the right. Visualized mastoid air cells elsewhere clear. Patient is intubated. IMPRESSION: Decreased attenuation involving the right dentate nucleus in the right cerebellum and immediately adjacent cerebellar parenchyma, concerning for recent and potentially acute infarct in this area. Elsewhere there is atrophy with moderate periventricular small vessel disease. There is no intracranial mass, hemorrhage, or extra-axial fluid collection. Multiple foci of arterial vascular calcification noted. Areas of paranasal sinus disease. Areas of right-sided mastoid disease. Electronically Signed   By: Bretta Bang III M.D.   On: 12/01/2016 13:57   Dg Chest Port 1 View  Result Date: 12/02/2016 EXAM: PORTABLE CHEST 1 VIEW COMPARISON:  None. FINDINGS: Endotracheal tube, NG tube, right IJ line stable position. Cardiac pacer stable position. Cardiomegaly with pulmonary vascular prominence  and bilateral interstitial prominence consistent with pulmonary interstitial edema. Small bilateral pleural effusions. No pneumothorax. IMPRESSION: 1.  Lines and tubes in stable position. 2. Cardiac pacer stable position. Cardiomegaly with pulmonary venous congestion and bilateral interstitial prominence consistent with pulmonary interstitial edema again noted. Small bilateral pleural effusions. Chest appears unchanged from prior exam . Electronically Signed   By: Maisie Fus  Register   On: 12/02/2016 07:31   US Abdomen Limited Ruq  Result Date: 12/02/2016 CLINICAL DATA:  Acute onset of transaminitis.  Initial encounter. EXAM: ULTRASOUND ABDOMEN LIMITED RIGHT UPPER QUADRANT COMPARISON:  None. FINDINGS: Gallbladder: Mild echogenic sludge and scattered tiny stones are noted within the gallbladder. No significant gallbladder wall thickening or pericholecystic fluid is seen. No ultrasonographic Murphy's sign is elicited. Common bile duct: Diameter: 0.3 cm, within normal limits in caliber. Liver: No focal lesion identified. Somewhat coarsened echotexture may reflect fatty infiltration. IMPRESSION: 1. No acute abnormality seen at the right upper quadrant. 2. Suggestion of fatty infiltration within the liver. 3.  Tiny stones and mild sludge within the gallbladder. No definite evidence for obstruction or cholecystitis. Electronically Signed   By: Roanna Raider M.D.   On: 12/02/2016 01:07    IMAGING/STUDIES: CTA CHEST/ABD/PELVIS 8/10: IMPRESSION: 1. Bowel perforation, with a large amount of free air and free fluid noted throughout the abdomen and pelvis. Tiny focus of air along the pylorus and proximal duodenum raises question for the site of perforation. This was better characterized on subsequent surgery. 2. Vague mild soft tissue inflammation about the proximal ileum at the left mid abdomen. 3. No evidence of aortic dissection. No evidence of aneurysmal dilatation. Scattered aortic atherosclerosis. 4. No evidence  of central pulmonary embolus. 5. Vague soft tissue inflammation about the bladder may be reactive in nature, or could reflect mild cystitis. 6. Cardiomegaly.  Scattered coronary artery calcification noted. 7. Trace right-sided pleural fluid. Patchy bibasilar airspace opacities likely reflect atelectasis. 8. Scattered diverticulosis along the ascending colon, without evidence of diverticulitis. 9. Borderline enlarged prostate. 10. Small to moderate bilateral inguinal hernias, containing only fat. TTE 8/12:  LV moderately dilated with EF 15% & diffuse hypokinesis. There is akinesis of the inferolateral and inferior myocardium. Study insufficient to assess diastolic function. LA & RA severely dilated. RV moderately dilated with moderately reduced systolic function. Pacer wires noted. Mild aortic regurgitation without stenosis. Aortic root normal in size. Moderate mitral regurgitation without stenosis. Trivial pulmonic regurgitation without stenosis. Moderate tricuspid regurgitation. No pericardial effusion. PORT CXR 8/13:  Previously reviewed by me. Silhouetting of left hemidiaphragm. Endotracheal tube in good position. Enteric feeding tube coursing below diaphragm. Right internal jugular central venous catheter in good position. Bilateral hilar fullness. CT HEAD W/O 8/14:  Decreased attenuation involving the right dentate nucleus in the right cerebellum and immediately adjacent cerebellar parenchyma, concerning for recent and potentially acute infarct in this area. Elsewhere there is atrophy with moderate periventricular small vessel disease. There is no intracranial mass, hemorrhage, or extra-axial fluid collection. Multiple foci of arterial vascular calcification noted. Areas of paranasal sinus disease. Areas of right-sided mastoid disease. RUQ U/S 8/15: IMPRESSION: 1. No acute abnormality seen at the right upper quadrant. 2. Suggestion of fatty infiltration within the liver. 3. Tiny stones and mild  sludge within the gallbladder. No definite evidence for obstruction or cholecystitis. PORT CXR 8/15:  Personally reviewed by me. Endotracheal tube in good position. Right internal jugular central venous catheter in good position. Enteric feeding tube coursing below diaphragm. Some improvement in bilateral lower lung zone patchy opacities. Blunting of left costophrenic angle consistent with small left pleural effusion.  MICROBIOLOGY: Blood Culture x2 8/9 >>> Urine Culture 8/9:  E coli Abdominal Wound Culture 8/10:  Normal Skin Flora  MRSA PCR 8/10:  Negative  Blood Cultures x2 8/10 >>> H. Pylori IgG:  5.00 (positive)  ANTIBIOTICS: Vancomycin 8/10 (x1 dose) Diflucan 8/10 - 8/15 Zosyn 8/10 >>> Flagyl 8/15 >>> Biaxin 8/15 >>>  SIGNIFICANT EVENTS: 08/10 - Admit w/ pneumoperitoneum >> taken to OR emergently for omental patch of gastric ulcer perforation 08/11 - High grade fever to 105F. Stress-dosed steroid started. 08/12 - Stress-dosed steroids tapered to q12hr 08/13 - Transfer to [facility] for arterial embolectomy 08/14 - Off pressors. Stopped Steroids. Altered mentation that is worse post-op & without any drips. Heparin drip stopped w/ bleeding from op site. 08/15 - More awake & following commands. Started on triple therapy for H pylori  LINES/TUBES: R BRACHIAL ART LINE 8/10 (out) OETT 8/10 >>> R IJ DL CVL 1/61 >>> ABD JP Drain  8/10 >>> Foley >>> OGT >>> PIV  ASSESSMENT / PLAN:  PULMONARY A: Acute hypoxic respiratory failure: Multifactorial.  P: Daily spontaneous breathing trial & pressure support weaning Continuing full ventilator support for rest  Intermittent chest x-ray imaging   CARDIOVASCULAR A: Right Upper Extremity Arterial Clot:  Secondary to arterial line. S/P embolectomy w/ vein patch 8/13.  Shock: Multifactorial. Likely strong component of sepsis. Resolved. Biventricular Heart Failure H/O NICM:  S/P ICD 2014. H/O Essential Hypertension H/O  Hyperlipidemia Paroxysmal Atrial Fibrillation Elevated Troponin I:  Likely multifactorial given acute illness.  H/O PAD  P: Continuing to monitor the patient on telemetry Continuing aspirin via tube daily Monitoring vitals per unit protocol Goal MAP >65 & SBP >90 Vascular surgery signed off Holding heparin drip  RENAL A: Acute renal failure: Improving. Hypokalemia: Replaced. Hyperkalemia: Resolved. Hyponatremia: Stable and mild.  P: Avoiding nephrotoxic agents Maintaining mean arterial pressure Trending urine output with Foley catheter Trending electrolytes and renal function daily KCl 10 mEq IV 4 runs K-Phos 40 mmol IV 1  GASTROINTESTINAL A: Perforated Pyloric Channel Ulcer:  S/P Repair 8/10 by Dr. Ezzard Standing. H. pylori IgG elevated. Ileus:  Post-op. Improving. Severe Protein-Calorie Malnutrition Transaminitis:  Worsening. Question possible component of hepatotoxicity from Diflucan.   P: Discontinuing Diflucan today Trending hepatic function panel daily Checking acetaminophen level & acute hepatitis panel Postoperative care per surgery service  HEME A: Leukocytosis:  Slowly improving. Likely secondary to sepsis. Thrombocytopenia:  Stable. Splenic sequestration versus consumption. Anemia: Mild. Likely secondary to blood loss.  P: Trending cell counts daily w/ CBC Transfusing for Hgb <7.0 or active bleeding  INFECTIOUS DISEASE A: Septic Shock: Shock resolved. Peritonitis:  Secondary to perforated ulcer. E coli UTI Helicobacter pylori infection  P: Continuing empiric Zosyn  Discontinuing Diflucan Adding Biaxin & Flagyl to regimen for treatment of H. pylori Awaiting finalization of cultures  ENDOCRINE A: Diabetes Mellitus:  Glucose controlled. Hypoglycemia:  On 8/11.   P: Continuing Accu-Cheks every 4 hours Continuing sliding scale insulin for moderate algorithm  NEUROLOGIC A: Sedation on Ventilator Acute Encephalopathy: Acutely worse  postsurgical.  New Cerebellar CVA H/O anxiety  H/O right hip pain & osteoarthritis H/O EtOH Use  P: Desired RASS:  0 to -1 Minimizing sedatives Fentanyl IV when necessary pain Versed IV when necessary sedation Continuing high-dose thiamine IV every 8 hours Consulting Neurology today  Prophylaxis:  SCDs, heparin [place] q8hr, & Protonix IV q12hr. Diet:  NPO. Starting trickle tube feedings today. Continuing TPN.  Code Status:  Full Code per previous physician discussions. Disposition:  Patient remains critically ill in the ICU.  Family Update:  Daughter updated at bedside today.  DISCUSSION:  71 y.o. male with multiple medical problems. Treating H. pylori with perforated peptic ulcer. Status post embolectomy right upper extremity after arterial line. Consulting neurology given findings on CT scan. Neurologic recovery progressing which is encouraging. Starting trickle tube feedings. Continuing pressure support wean.  I have spent a total of 32 minutes of critical care time today caring for the patient, updating daughter at bedside, and reviewing the patient's electronic medical record.   Donna Christen Jamison Neighbor, M.D. [facility] Pulmonary & Critical Care Pager:  [phone] After 3pm or if no response, call [phone] 10:01 AM 12/02/16

## 2016-12-02 NOTE — Consult Note (Signed)
NEURO HOSPITALIST CONSULT NOTE   Requestig physician: Dr. Vaughan Browner   Reason for Consult: prognostication   History obtained from:  Chart   HPI:                                                                                                                                          Jacob Pittman is an 71 y.o. male who presented to [facility] 8/9 with worsening lower abd pain over 3 days, belching, vomiting x 3 days, and constipation (no BM x 2 weeks). In the ER he had a lactate of 9.4 and was given 4L IVF per sepsis protocol. He then became hypoxic with rhonchi and increased work of breathing. CT revealed pneumoperitoneum with moderate amount of free fluid due to a bowel perforation.   He was taken to the OR where he was found to have a perforated pyloric channel ulcer along with 2,429m of gastric contents within the abdominal cavity.  This was repaired and he was then transferred to the ICU.  Pt was septic post op and had delirium. Patient then found to have acute ischemia with threatened hand right arm needing a Right brachial ulnar and radial artery embolectomy. Patient remained critically ill and poor condition.    TTE 8/12:  LV moderately dilated with EF 15% & diffuse hypokinesis. There is akinesis of the inferolateral and inferior myocardium. CT HEAD W/O 8/14:  Decreased attenuation involving the right dentate nucleus in the right cerebellum and immediately adjacent cerebellar parenchyma, concerning for recent and potentially acute infarct in this area.   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 ANXIETY 02/04/2009  . CHF (congestive heart failure) ([facility]   . Chronic systolic CH[place] ([place]estive heart failure) [place]   . DIABETES MELLITUS, TYPE II 11/07/2008  . 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 HIP PAIN, RIGHT 11/06/2009  . HYPERLIPIDEMIA 11/07/2008  . HYPERTENSION 10/08/2008  . Nonischemic cardiomyopathy ([facility]     s/p ICD implantation 03-2013 by Dr TLovena Le . OSTEOARTHRITIS, KNEE, RIGHT 10/08/2008  . Peripheral arterial disease ([facility]     Past Surgical History:  Procedure Laterality Date  . CARDIAC DEFIBRILLATOR PLACEMENT Left 04/10/13   BTK single chamber ICD implanted by Dr TLovena Lefor primary prevention  . EMBOLECTOMY Right 11/30/2016   Procedure: BRACHIAL EMBOLECTOMY WITH VEIN PATCH ANGIOPLASTY;  Surgeon: FElam Dutch MD;  Location[facility]  Service: Vascular;  Laterality: Right;  . IMPLANTABLE CARDIOVERTER DEFIBRILLATOR IMPLANT N/A 04/10/2013   Procedure: IMPLANTABLE CARDIOVERTER DEFIBRILLATOR IMPLANT;  Surgeon: GEvans Lance MD;  [facility];  Service: Cardiovascular;  Laterality: N/A;  . LAPAROTOMY N/A 11/27/2016   Procedure: EXPLORATORY LAPAROTOMY abdominal exploration oversew pyloric channel ulcer gram patch repair perforated ulcer;  Surgeon: Alphonsa Overall, MD;  Location: [facility];  Service: General;  Laterality: N/A;  . s/p left broken arm with pin     1980's    Family History  Problem Relation Age of Onset  . Diabetes Mother   . Hypertension Mother      Social History:  reports that he has quit smoking. He has never used smokeless tobacco. He reports that he drinks alcohol. He reports that he does not use drugs.  No Known Allergies  MEDICATIONS:                                                                                                                     Prior to Admission:  Prescriptions Prior to Admission  Medication Sig Dispense Refill Last Dose  . aspirin 81 MG EC tablet Take 81 mg by mouth daily.     11/25/2016 at Unknown time  . carvedilol (COREG) 12.5 MG tablet TAKE ONE TABLET BY MOUTH TWICE DAILY 60 tablet 2 11/25/2016 at 2200  . fluticasone (FLONASE) 50 MCG/ACT nasal spray Place 2 sprays into both nostrils daily. 16 g 2 11/25/2016 at Unknown time  . furosemide (LASIX) 40 MG tablet Take 40 mg by mouth daily.   11/25/2016 at Unknown time  . metFORMIN  (GLUCOPHAGE-XR) 500 MG 24 hr tablet TAKE TWO TABLETS BY MOUTH ONCE DAILY IN THE MORNING 180 tablet 0 11/25/2016 at Unknown time  . metolazone (ZAROXOLYN) 2.5 MG tablet TAKE ONE TABLET BY MOUTH EVERY OTHER DAY 15 tablet 11 11/24/2016 at Unknown time  . rosuvastatin (CRESTOR) 20 MG tablet Take 1 tablet (20 mg total) by mouth daily. 90 tablet 3 11/25/2016 at Unknown time  . albuterol (PROVENTIL HFA;VENTOLIN HFA) 108 (90 BASE) MCG/ACT inhaler Inhale 2 puffs into the lungs every 6 (six) hours as needed for wheezing or shortness of breath. 1 Inhaler 5 Taking  . Blood Glucose Monitoring Suppl (ONE TOUCH ULTRA SYSTEM KIT) w/Device KIT Use as directed daily 1 each 0 Taking  . cetirizine (ZYRTEC) 10 MG tablet Take 1 tablet (10 mg total) by mouth daily. (Patient not taking: Reported on 11/26/2016) 30 tablet 11 Completed Course at Unknown time  . furosemide (LASIX) 40 MG tablet Take 1 tablet (40 mg total) by mouth daily. (Patient not taking: Reported on 11/26/2016) 30 tablet 6 Not Taking at Unknown time  . glucose blood test strip Use as instructed 100 each 12 Taking  . Lancets MISC Use as directed once daily 100 each 12 Taking  . sildenafil (REVATIO) 20 MG tablet Take 1 tablet (20 mg total) by mouth as directed. Take 2-4 tablets by mouth as needed (Patient taking differently: Take 40 mg by mouth daily as needed (ed). ) 40 tablet 1 unknown  . silver sulfADIAZINE (SILVADENE) 1 % cream Apply 1 application topically daily. (Patient not taking: Reported on 11/26/2016) 50 g 0 Completed Course at Unknown time  . spironolactone (ALDACTONE) 25 MG tablet Take 0.5 tablets (12.5 mg total) by mouth daily. (Patient not taking: Reported  on 11/26/2016) 30 tablet 0 Completed Course at Unknown time  . Tadalafil 2.5 MG TABS Take 1 tablet (2.5 mg total) by mouth daily as needed. (Patient taking differently: Take 2.5 mg by mouth daily as needed (erectile dysfunction). ) 20 each 0 unknown   Scheduled: . aspirin  81 mg Per Tube Daily  .  chlorhexidine gluconate (MEDLINE KIT)  15 mL Mouth Rinse BID  . Chlorhexidine Gluconate Cloth  6 each Topical Daily  . clarithromycin  500 mg Per Tube Q12H  . feeding supplement (VITAL HIGH PROTEIN)  1,000 mL Per Tube Q24H  . heparin subcutaneous  5,000 Units Subcutaneous Q8H  . insulin aspart  0-20 Units Subcutaneous Q4H  . mouth rinse  15 mL Mouth Rinse QID  . metroNIDAZOLE  500 mg Per Tube TID  . pantoprazole (PROTONIX) IV  40 mg Intravenous Q12H  . sodium chloride flush  10-40 mL Intracatheter Q12H     ROS:                                                                                                                                       History obtained from unobtainable from patient due to intubation     Blood pressure (!) 83/61, pulse 64, temperature 97.8 F (36.6 C), temperature source Axillary, resp. rate (!) 25, height '5\' 8"'$  (1.727 m), weight 98.6 kg (217 lb 6 oz), SpO2 100 %.   Neurologic Examination:                                                                                                      HEENT-  Normocephalic, no lesions, without obvious abnormality.  Normal external eye and conjunctiva.  Normal TM's bilaterally.  Normal auditory canals and external ears. Normal external nose, mucus membranes and septum.  Normal pharynx. Cardiovascular- S1, S2 normal, pulses palpable throughout   Lungs- chest clear, no wheezing, rales, normal symmetric air entry Abdomen- normal findings: bowel sounds normal Extremities- no clubbing Lymph-no adenopathy palpable Musculoskeletal-no joint tenderness, deformity or swelling Skin-warm and dry, no hyperpigmentation, vitiligo, or suspicious lesions  Neurological Examination Mental Status: Currently intubated and unable to speak. Patient however is able to follow simple commands such as reaching out touching my hand and then touching his nose, we'll going all fingers raising his arms to the rest of his ability and his legs to the  best of his ability. Cranial Nerves: II: Visual fields grossly normal,  III,IV, VI: ptosis not present, extra-ocular motions intact bilaterally  pupils equal, round, reactive to light and accommodation V,VII: Face appears symmetric however he is intubated and has multiple tubes, facial light touch sensation normal bilaterally VIII: hearing normal bilaterally  Motor: Moving all extremities antigravity however he is moving his right arm less than his left secondary to recent surgery Sensory: Pinprick and light touch intact throughout, bilaterally Deep Tendon Reflexes: 2+ and symmetric throughout upper extremities with no lower extremities Plantars: Right: downgoing   Left: downgoing Cerebellar: normal finger-to-nose, Gait: not tested      Lab Results: Basic Metabolic Panel:  Recent Labs Lab 11/28/16 0200 11/29/16 0620  11/30/16 0625 11/30/16 2124 12/01/16 0004 12/01/16 0304 12/02/16 0400  NA 135 130*  < > 132* 132* 132* 130* 131*  K 4.0 2.7*  < > 3.1* 3.9 5.5* 4.1 2.3*  CL 96* 91*  < > 92* 98* 98* 95* 95*  CO2 21* 23  < > 22 18* 14* 20* 22  GLUCOSE 137* 659*  < > 169* 109* 104* 158* 142*  BUN 52* 56*  < > 74* 77* 82* 79* 85*  CREATININE 2.81* 2.32*  < > 2.73* 2.98* 2.92* 3.02* 2.61*  CALCIUM 7.0* 6.6*  < > 6.6* 5.7* 6.6* 6.6* 6.5*  MG 2.3 2.3  --  2.5*  --  2.6* 2.7* 2.4  PHOS 3.5 3.3  --  5.7*  --   --  3.5 2.4*  < > = values in this interval not displayed.  Liver Function Tests:  Recent Labs Lab 11/27/16 0740 11/29/16 1030 11/30/16 2124 12/01/16 0304 12/02/16 0400  AST 80* 55* 74* 118* 173*  ALT 37 35 41 88* 109*  ALKPHOS 38 55 29* 69 49  BILITOT 2.6* 1.9* 1.5* 2.2* 2.3*  PROT 5.2* 5.0* <3.0* 5.3* 4.7*  ALBUMIN 3.0* 2.2* 1.1* 2.0* 1.8*    Recent Labs Lab 11/26/16 1553 11/27/16 0530 12/01/16 0304  LIPASE 109* 59* 20  AMYLASE  --   --  63   No results for input(s): AMMONIA in the last 168 hours.  CBC:  Recent Labs Lab 11/27/16 0530   11/28/16 0200 11/29/16 0620 11/30/16 0625 12/01/16 0304 12/02/16 0400  WBC 2.4*  < > 9.8 15.6* 15.9* 13.8* 12.6*  NEUTROABS 2.0  --   --   --   --  11.8* 10.4*  HGB 13.2  < > 13.6 12.0* 12.0* 12.2* 10.6*  HCT 37.7*  < > 39.0 34.5* 33.8* 35.3* 30.2*  MCV 96.7  < > 98.2 98.6 96.3 96.2 94.7  PLT 161  < > 124* 81* 80* 102* 93*  < > = values in this interval not displayed.  Cardiac Enzymes:  Recent Labs Lab 11/27/16 0740 11/27/16 1708 11/27/16 2042 11/28/16 0139 11/28/16 1206  TROPONINI 0.04* 0.11* 0.14* 0.17* 0.17*    Lipid Panel:  Recent Labs Lab 12/01/16 0304 12/02/16 0400  TRIG 132 154*    CBG:  Recent Labs Lab 12/01/16 1610 12/01/16 1932 12/01/16 2334 12/02/16 0341 12/02/16 0736  GLUCAP 129* 155* 139* 143* 160*    Microbiology: Results for orders placed or performed during the hospital encounter of 11/26/16  Blood Culture (routine x 2)     Status: None (Preliminary result)   Collection Time: 11/26/16 11:19 PM  Result Value Ref Range Status   Specimen Description BLOOD BLOOD RIGHT ARM  Final   Special Requests IN PEDIATRIC BOTTLE Blood Culture adequate volume  Final   Culture   Final    NO GROWTH 4 DAYS Performed at [facility]  Lab, [address], [place], [place] [address]    Report Status PENDING  Incomplete  Urine culture     Status: Abnormal   Collection Time: 11/26/16 11:33 PM  Result Value Ref Range Status   Specimen Description URINE, CLEAN CATCH  Final   Special Requests NONE  Final   Culture >=100,000 COLONIES/mL ESCHERICHIA COLI (A)  Final   Report Status 11/29/2016 FINAL  Final   Organism ID, Bacteria ESCHERICHIA COLI (A)  Final      Susceptibility   Escherichia coli - MIC*    AMPICILLIN >=32 RESISTANT Resistant     CEFAZOLIN <=4 SENSITIVE Sensitive     CEFTRIAXONE <=1 SENSITIVE Sensitive     CIPROFLOXACIN <=0.25 SENSITIVE Sensitive     GENTAMICIN <=1 SENSITIVE Sensitive     IMIPENEM <=0.25 SENSITIVE Sensitive     NITROFURANTOIN  <=16 SENSITIVE Sensitive     TRIMETH/SULFA <=20 SENSITIVE Sensitive     AMPICILLIN/SULBACTAM 16 INTERMEDIATE Intermediate     PIP/TAZO <=4 SENSITIVE Sensitive     Extended ESBL NEGATIVE Sensitive     * >=100,000 COLONIES/mL ESCHERICHIA COLI  Blood Culture (routine x 2)     Status: None (Preliminary result)   Collection Time: 11/26/16 11:34 PM  Result Value Ref Range Status   Specimen Description BLOOD BLOOD RIGHT HAND  Final   Special Requests   Final    BOTTLES DRAWN AEROBIC AND ANAEROBIC Blood Culture adequate volume   Culture   Final    NO GROWTH 4 DAYS Performed at [facility], [address], [place], [place] [address]    Report Status PENDING  Incomplete  Aerobic Culture (superficial specimen)     Status: None   Collection Time: 11/27/16  6:30 AM  Result Value Ref Range Status   Specimen Description WOUND ABDOMEN  Final   Special Requests NONE  Final   Gram Stain   Final    FEW WBC PRESENT,BOTH PMN AND MONONUCLEAR NO ORGANISMS SEEN    Culture   Final    NORMAL SKIN FLORA Performed at [facility], [address], [place], [place] [address]    Report Status 11/29/2016 FINAL  Final  MRSA PCR Screening     Status: None   Collection Time: 11/27/16  6:52 AM  Result Value Ref Range Status   MRSA by PCR NEGATIVE NEGATIVE Final    Comment:        The GeneXpert MRSA Assay (FDA approved for NASAL specimens only), is one component of a comprehensive MRSA colonization surveillance program. It is not intended to diagnose MRSA infection nor to guide or monitor treatment for MRSA infections.   Culture, blood (routine x 2)     Status: None (Preliminary result)   Collection Time: 11/27/16  7:40 AM  Result Value Ref Range Status   Specimen Description BLOOD RIGHT HAND  Final   Special Requests IN PEDIATRIC BOTTLE Blood Culture adequate volume  Final   Culture   Final    NO GROWTH 4 DAYS Performed at [facility], [place] [address] [place], [place]  [address]    Report Status PENDING  Incomplete  Culture, blood (routine x 2)     Status: None (Preliminary result)   Collection Time: 11/27/16  7:40 AM  Result Value Ref Range Status   Specimen Description BLOOD LEFT HAND  Final   Special Requests IN PEDIATRIC BOTTLE Blood Culture adequate volume  Final   Culture   Final  NO GROWTH 4 DAYS Performed at [facility], [place] [address] [place], [place] [address]    Report Status PENDING  Incomplete    Coagulation Studies:  Recent Labs  11/30/16 1036  LABPROT 16.8*  INR 1.35    Imaging: Ct Head Wo Contrast  Result Date: 12/01/2016 CLINICAL DATA:  Altered level of consciousness. Recent episode of hypertension. EXAM: CT HEAD WITHOUT CONTRAST TECHNIQUE: Contiguous axial images were obtained from the base of the skull through the vertex without intravenous contrast. COMPARISON:  June 10, 2010 FINDINGS: Brain: There is mild diffuse atrophy. There is no intracranial mass, hemorrhage, extra-axial fluid collection, or midline shift. There is decreased attenuation involving the dentate nucleus of the right cerebellum and immediately adjacent cerebellar tissue, a finding concerning for recent infarct in the right mid cerebellar region. No other potential acute/recent infarct is demonstrated on this study. There is small vessel disease throughout the centra semiovale bilaterally, present on prior study. Vascular: No hyperdense vessel. There is calcification in both carotid siphon regions and more subtly in each middle cerebral artery. There is also calcification in the distal vertebral arteries. Skull: Bony calvarium appears intact. Sinuses/Orbits: There is mucosal thickening in the maxillary antra, more on the left than on the right, with a probable retention cyst in the inferior posterior left maxillary antrum. There is mucosal thickening in several ethmoid air cells bilaterally. Other paranasal sinuses appear clear. Orbits appear symmetric  bilaterally. Other: There is mucosal thickening in several mastoid air cells on the right. Visualized mastoid air cells elsewhere clear. Patient is intubated. IMPRESSION: Decreased attenuation involving the right dentate nucleus in the right cerebellum and immediately adjacent cerebellar parenchyma, concerning for recent and potentially acute infarct in this area. Elsewhere there is atrophy with moderate periventricular small vessel disease. There is no intracranial mass, hemorrhage, or extra-axial fluid collection. Multiple foci of arterial vascular calcification noted. Areas of paranasal sinus disease. Areas of right-sided mastoid disease. Electronically Signed   By: Lowella Grip III M.D.   On: 12/01/2016 13:57   Dg Chest Port 1 View  Result Date: 12/02/2016 EXAM: PORTABLE CHEST 1 VIEW COMPARISON:  None. FINDINGS: Endotracheal tube, NG tube, right IJ line stable position. Cardiac pacer stable position. Cardiomegaly with pulmonary vascular prominence and bilateral interstitial prominence consistent with pulmonary interstitial edema. Small bilateral pleural effusions. No pneumothorax. IMPRESSION: 1.  Lines and tubes in stable position. 2. Cardiac pacer stable position. Cardiomegaly with pulmonary venous congestion and bilateral interstitial prominence consistent with pulmonary interstitial edema again noted. Small bilateral pleural effusions. Chest appears unchanged from prior exam . Electronically Signed   By: Marcello Moores  Register   On: 12/02/2016 07:31   US Abdomen Limited Ruq  Result Date: 12/02/2016 CLINICAL DATA:  Acute onset of transaminitis.  Initial encounter. EXAM: ULTRASOUND ABDOMEN LIMITED RIGHT UPPER QUADRANT COMPARISON:  None. FINDINGS: Gallbladder: Mild echogenic sludge and scattered tiny stones are noted within the gallbladder. No significant gallbladder wall thickening or pericholecystic fluid is seen. No ultrasonographic Murphy's sign is elicited. Common bile duct: Diameter: 0.3 cm, within  normal limits in caliber. Liver: No focal lesion identified. Somewhat coarsened echotexture may reflect fatty infiltration. IMPRESSION: 1. No acute abnormality seen at the right upper quadrant. 2. Suggestion of fatty infiltration within the liver. 3. Tiny stones and mild sludge within the gallbladder. No definite evidence for obstruction or cholecystitis. Electronically Signed   By: Garald Balding M.D.   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 Patient has a AICD an unfortunate is unable to have a MRI of the brain. He is on aspirin. Patient is recently had a echocardiogram thus at this time would further stroke up with a carotid Doppler, A1c, and lipid profile. Dr. Lorraine Lax to attest this note   NEUROHOSPITALIST ADDENDUM  Consulted for Ams, CT head shows R cerebellar infarct  Seen and examined patient, he is doing quite well. Alert and oriented, tries to talk despite intubated. No significant ataxia on examination, moves all 4 extremities with good strength.  I reviewed the CT and agree there is a hypodensity in right cerebellum suggestive of subacute infarct. Possibly smaller one in Left cerebellum.   Suspect stroke is likely cardiombolic. Given his low EF he has a high chance of having underlying Afib/ or had developed a thrombus that embolized. However given he has an recent stroke is in the cerebellum, I would NOT ANTICOAGULATE HIM NOW. In case primary team wants to start [facility], please repeat CT head and call us.   Other possible mechanism for stroke is hypotension, currently on pressors and recommend to reach normotension as much as possible.   Recommend Carotid US Avoid hypotension Continue ASA '81mg'$ , sq heparin NIHSS and neurochecks  q4h Atorvastatin 40 mg if not CX Tele, interrogate PCM for afib and consider anticoagulation in the future (atleast 10-14 days from today).    Will continue to follow. Thanks  Karena Addison Aroor MD [facility] [phone]  If 7pm to 7am, please call on call as listed on AMION.

## 2016-12-02 NOTE — Progress Notes (Signed)
CRITICAL VALUE ALERT  Critical Value:  K 2.3 Date & Time Notied:  0545  Provider Notified: Dr Vassie Loll  Orders Received/Actions taken:  KCL 10 meq in 50 cc  IVPB  x 4

## 2016-12-02 NOTE — Progress Notes (Signed)
Dr. Jamison Neighbor at bedside to evaluate pt. Updated of pt condition. Informed that BP readings from vary and would at times read with systolic BP in the 60-70s, unable to take BP through arms due to recent surgery and IVF infusing on opposite arm. Per MD, values are accetable as long as pt's extremities remain warm to touch with pulses heard over doppler and that pt is arousable and following commands. No arterial line for BP readings at this time due to complication from recent arterial line site prior to admission.

## 2016-12-02 NOTE — Progress Notes (Addendum)
PHARMACY - ADULT TOTAL PARENTERAL NUTRITION CONSULT NOTE   Pharmacy Consult for TPN Indication: Prolonged Ileus  Patient Measurements: Height: 5\' 8"  (172.7 cm) Weight: 217 lb 6 oz (98.6 kg) IBW/kg (Calculated) : 68.4 TPN AdjBW (KG): 76 Body mass index is 33.05 kg/m.  Assessment:  71 yo M presents with gastric ulcer perforation and peritonitis. PMH includes ischemic cardiomyopathy, DM, HTN, CHF, HLD, PVD. He underwent emergent ex-lap with omental patch repair 8/10. Had multi-organ failure post-op requiring intubation and pressors. Acute ischemia of R hand on 8/13 requiring transfer to [facility] for R brachial ulner/radial artery embolectomy. Now with significant ileus. Pharmacy consulted to start TPN. Per Nutrition note, pt usually has good appetite, starting having vomiting w/ any intake 2-3 days PTA.  GI: NG tube output decreased at in last 24hr. Is producing stool. Last BM 8/15. Albumin low at 1.8. Prealbumin down to <5. Starting trickle TF 8/15. PPI Endo: CBGs ok (139-160). Hydrocortisone d/c'd, on resistant scale SSI. D10 d/c'd 8/13 PM Insulin requirements in the past 24 hours: 16 units Lytes: K dropped 4.1>2.3 (goal>/=4 w/ ileus), Na/Cl low but stable. Mg down 2.4, Phos decreased 3.5>2.4. CoCa low 8.2 *TPN w/ no lytes 8/13, 8/14 Renal: AKI previously worsening, improved today. SCr decreased to 2.61, CrCl ~1ml/min. UOP improved to 1.2 ml/kg/hr. BUN elevated and rising to 85. NS at [facility] Pulm: Intubated on 30% of FiO2 Cards: Heparin gtt for Afib and ischemic limb d/c'd w/ post-op bleeding from incision/IV site. Frequent PVCs 8/13. BP soft - pressors off. Asa, lasix x 1 8/13 Hepatobil: Tbili elevated, trend up 2.3 - not noted to be jaundiced per RN. LFTs mildly elevated, trending up - AST/ALT 173/109, TG 132>154 Neuro: Fentanyl/Versed PRN. GCS 12. RASS at goal -1, CPOT 0-1. Thiamine q8h x 3 days ID: Zosyn D6 for bowel perforation, Ecoli UTI. Hpylori+ on Flagyl, clarithromycin. Tmax/24h  100.9, WBC down 13.8. BCx: NGTD  Best Practices: SCDs, MC, ppi TPN Access: Double lumen RIJ >> TPN start date: 8/13 >>  Nutritional Goals (per RD recommendation on 8/13): KCal: 1000-1270  Protein: 140 g  Current Nutrition:  NPO Clinimix 5/15 Trickle TF per MD  Plan:  -Continue Clinimix E 5/15 (add back lytes) at 87ml/hr with pt showing signs of refeeding. Will not be able to meet nutritional needs with Clinimix. Will provide 72g protein and 1022 kcal in 24hr period. -Hold 20% lipid emulsion for first 7 days for ICU patients per ASPEN guidelines (Start date 8/20) -Continue NS at 60ml/hr per MD -Add MVI and trace elements in TPN -Continue resistant SSI and adjust as needed -K phos x 1 -Additional K runs x 6 (MD ordered K runs x 4 this AM) -Monitor TPN labs -F/u TF progression and ability to wean TPN as appropriate   Babs Bertin, PharmD, BCPS Clinical Pharmacist Rx Phone # for today: [phone] After 3:30PM, please call Main Rx: [id number] 12/02/2016 8:13 AM

## 2016-12-02 NOTE — Progress Notes (Signed)
eLink Physician-Brief Progress Note Patient Name: Jacob Pittman DOB: 1946/04/03 MRN: [id number]   Date of Service  12/02/2016  HPI/Events of Note    eICU Interventions  Hypokalemia -repleted      Intervention Category Intermediate Interventions: Electrolyte abnormality - evaluation and management  Kileigh Ortmann V. 12/02/2016, 5:48 AM

## 2016-12-02 NOTE — Progress Notes (Signed)
[facility]  Subjective  - sedated on vent   Objective (!) 83/61 64 97.8 F (36.6 C) (Axillary) (!) 25 100%  Intake/Output Summary (Last 24 hours) at 12/02/16 0851 Last data filed at 12/02/16 0800  Gross per 24 hour  Intake          1774.33 ml  Output             3550 ml  Net         -1775.67 ml   Radial ulnar doppler fairly symmetric right and left today.  Right hand much warmer, pink, incision clean DP pedal doppler bilaterally  Assessment/Planning: Overall improved hand perfusion Ok to restart heparin for cardiac indications if needed No need for anticoagulation for his arm or legs from my standpoint  If no heparin or coumadin would consider asa but may not be worth risk in light of ulcer disease Will follow at a distance.  Call if questions  Fabienne Bruns 12/02/2016 8:51 AM --  Laboratory Lab Results:  Recent Labs  12/01/16 0304 12/02/16 0400  WBC 13.8* 12.6*  HGB 12.2* 10.6*  HCT 35.3* 30.2*  PLT 102* 93*   BMET  Recent Labs  12/01/16 0304 12/02/16 0400  NA 130* 131*  K 4.1 2.3*  CL 95* 95*  CO2 20* 22  GLUCOSE 158* 142*  BUN 79* 85*  CREATININE 3.02* 2.61*  CALCIUM 6.6* 6.5*    COAG Lab Results  Component Value Date   INR 1.35 11/30/2016   INR 1.23 11/26/2016   INR 1.1 (H) 04/03/2013   No results found for: PTT

## 2016-12-02 NOTE — Progress Notes (Signed)
[facility] Progress Note  2 Days Post-Op  Subjective: CC:  Daughter at bedside. Patient awake, alert, and FC this morning on the vent. Denies pain.   Objective: Vital signs in last 24 hours: Temp:  [97.1 F (36.2 C)-100.9 F (38.3 C)] 98.3 F (36.8 C) (08/15 0359) Pulse Rate:  [32-124] 104 (08/15 0600) Resp:  [23-38] 25 (08/15 0600) BP: (82-155)/(40-125) 88/70 (08/15 0600) SpO2:  [98 %-100 %] 100 % (08/15 0600) FiO2 (%):  [30 %] 30 % (08/15 0600) Weight:  [98.6 kg (217 lb 6 oz)] 98.6 kg (217 lb 6 oz) (08/15 0332) Last BM Date: 12/02/16  Intake/Output from previous day: 08/14 0701 - 08/15 0700 In: 1970 [I.V.:1420; IV Piggyback:550] Out: 3195 [Urine:2725; Emesis/NG output:400; Drains:70] Intake/Output this shift: No intake/output data recorded.  PE: Gen:  Alert, NAD, pleasant HEENT: ETT in place Card:  Regular rate and rhythm, pedal pulses 2+ BL Pulm:  Ventilated breathing, clear to auscultation bilaterally w/ good air movement. Abd: Soft, non-tender, mild distention, bowel sounds present, midline incision w/ dressing place c/d/i.  JP drain: 70 cc/24h  NGT: 400 cc/24h Skin: warm and dry, no rashes  Neuro: awake/alert, following commands, squeezed my fingerswith his hands bilaterally to command. moves all 4 extremities spontaneously  Lab Results:   Recent Labs  12/01/16 0304 12/02/16 0400  WBC 13.8* 12.6*  HGB 12.2* 10.6*  HCT 35.3* 30.2*  PLT 102* 93*   BMET  Recent Labs  12/01/16 0304 12/02/16 0400  NA 130* 131*  K 4.1 2.3*  CL 95* 95*  CO2 20* 22  GLUCOSE 158* 142*  BUN 79* 85*  CREATININE 3.02* 2.61*  CALCIUM 6.6* 6.5*   PT/INR  Recent Labs  11/30/16 1036  LABPROT 16.8*  INR 1.35   CMP     Component Value Date/Time   NA 131 (L) 12/02/2016 0400   K 2.3 (LL) 12/02/2016 0400   CL 95 (L) 12/02/2016 0400   CO2 22 12/02/2016 0400   GLUCOSE 142 (H) 12/02/2016 0400   BUN 85 (H) 12/02/2016 0400   CREATININE 2.61 (H) 12/02/2016  0400   CREATININE 0.91 03/04/2015 1618   CALCIUM 6.5 (L) 12/02/2016 0400   PROT 4.7 (L) 12/02/2016 0400   ALBUMIN 1.8 (L) 12/02/2016 0400   AST 173 (H) 12/02/2016 0400   ALT 109 (H) 12/02/2016 0400   ALKPHOS 49 12/02/2016 0400   BILITOT 2.3 (H) 12/02/2016 0400   GFRNONAA 23 (L) 12/02/2016 0400   GFRAA 26 (L) 12/02/2016 0400   Lipase     Component Value Date/Time   LIPASE 20 12/01/2016 0304       Studies/Results: Ct Head Wo Contrast  Result Date: 12/01/2016 CLINICAL DATA:  Altered level of consciousness. Recent episode of hypertension. EXAM: CT HEAD WITHOUT CONTRAST TECHNIQUE: Contiguous axial images were obtained from the base of the skull through the vertex without intravenous contrast. COMPARISON:  June 10, 2010 FINDINGS: Brain: There is mild diffuse atrophy. There is no intracranial mass, hemorrhage, extra-axial fluid collection, or midline shift. There is decreased attenuation involving the dentate nucleus of the right cerebellum and immediately adjacent cerebellar tissue, a finding concerning for recent infarct in the right mid cerebellar region. No other potential acute/recent infarct is demonstrated on this study. There is small vessel disease throughout the centra semiovale bilaterally, present on prior study. Vascular: No hyperdense vessel. There is calcification in both carotid siphon regions and more subtly in each middle cerebral artery. There is also calcification in the distal vertebral arteries.  Skull: Bony calvarium appears intact. Sinuses/Orbits: There is mucosal thickening in the maxillary antra, more on the left than on the right, with a probable retention cyst in the inferior posterior left maxillary antrum. There is mucosal thickening in several ethmoid air cells bilaterally. Other paranasal sinuses appear clear. Orbits appear symmetric bilaterally. Other: There is mucosal thickening in several mastoid air cells on the right. Visualized mastoid air cells elsewhere  clear. Patient is intubated. IMPRESSION: Decreased attenuation involving the right dentate nucleus in the right cerebellum and immediately adjacent cerebellar parenchyma, concerning for recent and potentially acute infarct in this area. Elsewhere there is atrophy with moderate periventricular small vessel disease. There is no intracranial mass, hemorrhage, or extra-axial fluid collection. Multiple foci of arterial vascular calcification noted. Areas of paranasal sinus disease. Areas of right-sided mastoid disease. Electronically Signed   By: Bretta Bang III M.D.   On: 12/01/2016 13:57   US Abdomen Limited Ruq  Result Date: 12/02/2016 CLINICAL DATA:  Acute onset of transaminitis.  Initial encounter. EXAM: ULTRASOUND ABDOMEN LIMITED RIGHT UPPER QUADRANT COMPARISON:  None. FINDINGS: Gallbladder: Mild echogenic sludge and scattered tiny stones are noted within the gallbladder. No significant gallbladder wall thickening or pericholecystic fluid is seen. No ultrasonographic Murphy's sign is elicited. Common bile duct: Diameter: 0.3 cm, within normal limits in caliber. Liver: No focal lesion identified. Somewhat coarsened echotexture may reflect fatty infiltration. IMPRESSION: 1. No acute abnormality seen at the right upper quadrant. 2. Suggestion of fatty infiltration within the liver. 3. Tiny stones and mild sludge within the gallbladder. No definite evidence for obstruction or cholecystitis. Electronically Signed   By: Roanna Raider M.D.   On: 12/02/2016 01:07    Anti-infectives: Anti-infectives    Start     Dose/Rate Route Frequency Ordered Stop   11/28/16 0800  fluconazole (DIFLUCAN) IVPB 200 mg     200 mg 100 mL/hr over 60 Minutes Intravenous Every 24 hours 11/27/16 0624     11/27/16 0630  fluconazole (DIFLUCAN) IVPB 400 mg     400 mg 100 mL/hr over 120 Minutes Intravenous  Once 11/27/16 0622 11/27/16 0841   11/27/16 0600  piperacillin-tazobactam (ZOSYN) IVPB 3.375 g     3.375 g 12.5 mL/hr  over 240 Minutes Intravenous Every 8 hours 11/27/16 0534     11/27/16 0515  piperacillin-tazobactam (ZOSYN) IVPB 3.375 g  Status:  Discontinued     3.375 g 100 mL/hr over 30 Minutes Intravenous  Once 11/27/16 0510 11/27/16 0518   11/27/16 0030  piperacillin-tazobactam (ZOSYN) IVPB 3.375 g     3.375 g 100 mL/hr over 30 Minutes Intravenous  Once 11/27/16 0016 11/27/16 0755   11/27/16 0030  vancomycin (VANCOCIN) IVPB 1000 mg/200 mL premix     1,000 mg 200 mL/hr over 60 Minutes Intravenous  Once 11/27/16 0016 11/27/16 0756       Assessment/Plan Acute ischemia right hand s/p R brachial ulnar and radial artery ebolectomy Septic shock 2/2 perf gastric ulcer - cont abx, on Levo Acute hypoxic respiratory failure - vent mgmt per CCM Hx PAD, sCHD, NICM s/p ICD placement 2014 HTN HLD AKI DM Acute encephalopathy - improved today -- off sedation  Perforated pyloric ulcer  POD#5 S/P EXPLORATORY LAPAROTOMY, GRAM PATCH REPAIR 11/27/16 Dr. Ovidio Kin - bowel function returning - 2 BMs, decreasing NGT output. Drain output clear - start trickle TF today - continue BID wet to dry dressing changes  - continue Zosyn, diflucan - continue IV Protonix  Malnutrition: prealbumin <5 FEN: NPO, IVF, TNA,  NGT - start TF; hypokalemia (2.3), Mg 2.4, hyponatremia (131), ID: WBC 12.6 from 13.8; Fluconazole 8/10 >>, Zosyn 8/10 >>  VTE: SCD's, heparin    Dispo: start trickle TF today, plan to slowly advance to goal rate   Continue current wound care   LOS: 5 days    Adam Phenix , [facility] 12/02/2016, 7:22 AM Pager: [phone] Consults: [phone] Mon-Fri 7:00 am-4:30 pm Sat-Sun 7:00 am-11:30 am

## 2016-12-02 NOTE — Progress Notes (Signed)
eLink Physician-Brief Progress Note Patient Name: Jacob Pittman DOB: 06-Aug-1945 MRN: [id number]   Date of Service  12/02/2016  HPI/Events of Note   K+ = 3.6 and Creatinine = 2.17. K+ replacement already ordered. Ca== = 6.3 and albumin = 1.7. Ca++  Corrects to 8.14  eICU Interventions  Will replace Ca++.     Intervention Category Major Interventions: Electrolyte abnormality - evaluation and management  Adlyn Fife Eugene 12/02/2016, 8:05 PM

## 2016-12-02 NOTE — Progress Notes (Signed)
CRITICAL VALUE ALERT  Critical Value: Calcium = 6.3  Date & Time Notied:  12/02/2016 1635  Provider Notified: Dr. Phil Dopp from E-link  Orders Received/Actions taken: Awaiting orders at this time

## 2016-12-03 ENCOUNTER — Inpatient Hospital Stay (HOSPITAL_COMMUNITY): Payer: Medicare PPO

## 2016-12-03 ENCOUNTER — Encounter (HOSPITAL_COMMUNITY): Payer: Self-pay | Admitting: *Deleted

## 2016-12-03 DIAGNOSIS — I255 Ischemic cardiomyopathy: Secondary | ICD-10-CM

## 2016-12-03 DIAGNOSIS — I481 Persistent atrial fibrillation: Secondary | ICD-10-CM

## 2016-12-03 LAB — CBC WITH DIFFERENTIAL/PLATELET
BASOS PCT: 0 %
Basophils Absolute: 0 10*3/uL (ref 0.0–0.1)
EOS ABS: 0 10*3/uL (ref 0.0–0.7)
EOS PCT: 0 %
HCT: 31.6 % — ABNORMAL LOW (ref 39.0–52.0)
HEMOGLOBIN: 11.2 g/dL — AB (ref 13.0–17.0)
LYMPHS PCT: 5 %
Lymphs Abs: 1 10*3/uL (ref 0.7–4.0)
MCH: 33.7 pg (ref 26.0–34.0)
MCHC: 35.4 g/dL (ref 30.0–36.0)
MCV: 95.2 fL (ref 78.0–100.0)
Monocytes Absolute: 0.8 10*3/uL (ref 0.1–1.0)
Monocytes Relative: 4 %
NEUTROS PCT: 91 %
Neutro Abs: 17.4 10*3/uL — ABNORMAL HIGH (ref 1.7–7.7)
Platelets: 128 10*3/uL — ABNORMAL LOW (ref 150–400)
RBC: 3.32 MIL/uL — AB (ref 4.22–5.81)
RDW: 13.9 % (ref 11.5–15.5)
WBC: 19.2 10*3/uL — ABNORMAL HIGH (ref 4.0–10.5)

## 2016-12-03 LAB — GLUCOSE, CAPILLARY
GLUCOSE-CAPILLARY: 100 mg/dL — AB (ref 65–99)
GLUCOSE-CAPILLARY: 254 mg/dL — AB (ref 65–99)
Glucose-Capillary: 247 mg/dL — ABNORMAL HIGH (ref 65–99)
Glucose-Capillary: 258 mg/dL — ABNORMAL HIGH (ref 65–99)
Glucose-Capillary: 284 mg/dL — ABNORMAL HIGH (ref 65–99)
Glucose-Capillary: 184 mg/dL — ABNORMAL HIGH (ref 65–99)

## 2016-12-03 LAB — HEPATITIS PANEL, ACUTE
HCV Ab: <0.1 s/co ratio (ref 0.0–0.9)
Hep A IgM: NEGATIVE
Hep B C IgM: NEGATIVE
Hepatitis B Surface Ag: NEGATIVE

## 2016-12-03 LAB — COMPREHENSIVE METABOLIC PANEL
ALT: 121 U/L — ABNORMAL HIGH (ref 17–63)
AST: 139 U/L — ABNORMAL HIGH (ref 15–41)
Albumin: 1.8 g/dL — ABNORMAL LOW (ref 3.5–5.0)
Alkaline Phosphatase: 71 U/L (ref 38–126)
Anion gap: 11 (ref 5–15)
BUN: 71 mg/dL — AB (ref 6–20)
CHLORIDE: 100 mmol/L — AB (ref 101–111)
CO2: 22 mmol/L (ref 22–32)
Calcium: 6.9 mg/dL — ABNORMAL LOW (ref 8.9–10.3)
Creatinine, Ser: 1.85 mg/dL — ABNORMAL HIGH (ref 0.61–1.24)
GFR calc Af Amer: 40 mL/min — ABNORMAL LOW (ref >60)
GFR calc non Af Amer: 34 mL/min — ABNORMAL LOW (ref >60)
GLUCOSE: 244 mg/dL — AB (ref 65–99)
POTASSIUM: 2.9 mmol/L — AB (ref 3.5–5.1)
SODIUM: 133 mmol/L — AB (ref 135–145)
Total Bilirubin: 2.3 mg/dL — ABNORMAL HIGH (ref 0.3–1.2)
Total Protein: 5.1 g/dL — ABNORMAL LOW (ref 6.5–8.1)

## 2016-12-03 LAB — MAGNESIUM: MAGNESIUM: 2.2 mg/dL (ref 1.7–2.4)

## 2016-12-03 LAB — PROCALCITONIN: PROCALCITONIN: 16.2 ng/mL

## 2016-12-03 LAB — PHOSPHORUS: Phosphorus: 2.4 mg/dL — ABNORMAL LOW (ref 2.5–4.6)

## 2016-12-03 MED ORDER — PRO-STAT SUGAR FREE PO LIQD
60.0000 mL | Freq: Three times a day (TID) | ORAL | Status: DC
  Administered 2016-12-03 – 2016-12-14 (×34): 60 mL
  Filled 2016-12-03 (×35): qty 60

## 2016-12-03 MED ORDER — NOREPINEPHRINE BITARTRATE 1 MG/ML IV SOLN
0.0000 ug/min | INTRAVENOUS | Status: DC
  Administered 2016-12-03: 30 ug/min via INTRAVENOUS
  Administered 2016-12-03: 22 ug/min via INTRAVENOUS
  Administered 2016-12-04: 30 ug/min via INTRAVENOUS
  Administered 2016-12-04 – 2016-12-05 (×3): 32 ug/min via INTRAVENOUS
  Administered 2016-12-05: 31 ug/min via INTRAVENOUS
  Administered 2016-12-08: 2 ug/min via INTRAVENOUS
  Filled 2016-12-03 (×10): qty 16

## 2016-12-03 MED ORDER — POTASSIUM CHLORIDE 10 MEQ/100ML IV SOLN: 10.0000 meq | INTRAVENOUS | Status: DC

## 2016-12-03 MED ORDER — POTASSIUM PHOSPHATES 15 MMOLE/5ML IV SOLN
15.0000 mmol | Freq: Once | INTRAVENOUS | Status: DC
  Filled 2016-12-03: qty 5

## 2016-12-03 MED ORDER — POTASSIUM PHOSPHATES 15 MMOLE/5ML IV SOLN
20.0000 mmol | Freq: Once | INTRAVENOUS | Status: AC
  Administered 2016-12-03: 20 mmol via INTRAVENOUS
  Filled 2016-12-03: qty 6.67

## 2016-12-03 MED ORDER — VITAL HIGH PROTEIN PO LIQD
1000.0000 mL | ORAL | Status: DC
  Administered 2016-12-03 – 2016-12-05 (×4): 1000 mL
  Administered 2016-12-05 – 2016-12-06 (×2)
  Administered 2016-12-06: 1000 mL
  Administered 2016-12-06 (×2)
  Administered 2016-12-07 – 2016-12-13 (×7): 1000 mL
  Administered 2016-12-14: 16:00:00
  Administered 2016-12-14: 1000 mL
  Filled 2016-12-03 (×9): qty 1000

## 2016-12-03 MED ORDER — POTASSIUM CHLORIDE 10 MEQ/50ML IV SOLN
10.0000 meq | INTRAVENOUS | Status: AC
  Administered 2016-12-03 (×5): 10 meq via INTRAVENOUS
  Filled 2016-12-03 (×5): qty 50

## 2016-12-03 MED ORDER — HEPARIN (PORCINE) IN NACL 100-0.45 UNIT/ML-% IJ SOLN
1550.0000 [IU]/h | INTRAMUSCULAR | Status: DC
  Administered 2016-12-03: 1300 [IU]/h via INTRAVENOUS
  Administered 2016-12-04: 1400 [IU]/h via INTRAVENOUS
  Administered 2016-12-05 – 2016-12-08 (×5): 1500 [IU]/h via INTRAVENOUS
  Administered 2016-12-09: 1600 [IU]/h via INTRAVENOUS
  Administered 2016-12-10: 1550 [IU]/h via INTRAVENOUS
  Administered 2016-12-10: 1600 [IU]/h via INTRAVENOUS
  Administered 2016-12-11 – 2016-12-12 (×2): 1550 [IU]/h via INTRAVENOUS
  Filled 2016-12-03 (×19): qty 250

## 2016-12-03 MED ORDER — POTASSIUM PHOSPHATES 15 MMOLE/5ML IV SOLN
20.0000 mmol | Freq: Once | INTRAVENOUS | Status: DC
  Filled 2016-12-03: qty 6.67

## 2016-12-03 MED ORDER — POTASSIUM CHLORIDE 10 MEQ/50ML IV SOLN
INTRAVENOUS | Status: AC
  Administered 2016-12-03: 10 meq
  Filled 2016-12-03: qty 50

## 2016-12-03 NOTE — Progress Notes (Signed)
Pharmacy Antibiotic Note  Darlene Bone is a 71 y.o. male admitted on 11/26/2016 with peritonitis. Pharmacy has been consulted for Zosyn dosing. Urine culture from 8/9 grew >100k E. Coli but all other cultures have remained negative. WBC trending down slightly and pt is afebrile.  Plan: -Continue Zosyn 3.375g IV EI q8h -Monitor LOT, renal funx  Height: 5\' 8"  (172.7 cm) Weight: 221 lb 12.5 oz (100.6 kg) IBW/kg (Calculated) : 68.4  Temp (24hrs), Avg:97.2 F (36.2 C), Min:96.3 F (35.7 C), Max:98 F (36.7 C)   Recent Labs Lab 11/26/16 2306 11/26/16 2342 11/27/16 0520  11/27/16 0740  11/29/16 0620  11/30/16 0625  12/01/16 0004 12/01/16 0304 12/02/16 0400 12/02/16 1542 12/03/16 0430  WBC  --   --   --   < > 3.7*  < > 15.6*  --  15.9*  --   --  13.8* 12.6*  --  19.2*  CREATININE  --   --   --   < > 2.05*  < > 2.32*  < > 2.73*  < > 2.92* 3.02* 2.61* 2.17* 1.85*  LATICACIDVEN 9.49* 7.5* 3.0*  --  3.8*  --   --   --   --   --   --   --   --   --   --   < > = values in this interval not displayed.  Estimated Creatinine Clearance: 40.9 mL/min (A) (by C-G formula based on SCr of 1.85 mg/dL (H)).    No Known Allergies  Antimicrobials this admission: 8/10 zosyn >>  8/10 diflucan >>8/15 8/10 vancomycin x1 8/15 Flagyl + Biaxin >>  Dose adjustments this admission: none  Microbiology results: 8/10 BCx: ngtd 8/10 Abd wound: normal skin flora 8/10 MRSA PCR: neg 8/9 BCx: ngtd 8/9 UCx: > 100K E coli R to Unaysn/amp, sens to all others, sens to Zosyn 8/10 BCx: NGTD  Thank you for allowing pharmacy to be a part of this patient's care.  Fredonia Highland, PharmD PGY-2 Cardiology Pharmacy Resident Pager: [phone] 12/03/2016

## 2016-12-03 NOTE — Progress Notes (Signed)
PULMONARY / CRITICAL CARE MEDICINE   Name: Jacob Pittman MRN: [id number] DOB: [birth year]    ADMISSION DATE:  11/26/2016   CONSULTATION DATE:  11/27/2016  REFERRING MD:  Ezzard Standing  CHIEF COMPLAINT:  Abdominal Pain  HISTORY OF PRESENT ILLNESS:  71 y.o. man with ischemic cardiomyopathy, diabetic hypertensive in poor health, admitted 8/9 with gastric ulcer perforation and peritonitis. Underwent emergent laparotomy with omental patch repair He was in [place] shock requiring epinephrine and Levophed drip. Course c/b acute ischemia of R arm requiring tx from [facility] to [facility] and urgent OR embolectomy, also c/b slow vent wean.   SUBJECTIVE:  No acute change overnight.  Tol PS wean 10/5 but mildly tachypneic.    REVIEW OF SYSTEMS:  Unable to obtain with patient intubated.   VITAL SIGNS: BP (!) 86/39   Pulse (!) 54   Temp 98 F (36.7 C) (Oral)   Resp (!) 27   Ht 5\' 8"  (1.727 m)   Wt 100.6 kg (221 lb 12.5 oz)   SpO2 100%   BMI 33.72 kg/m   HEMODYNAMICS:    VENTILATOR SETTINGS: Vent Mode: CPAP;PSV FiO2 (%):  [30 %] 30 % Set Rate:  [25 bmp] 25 bmp Vt Set:  [550 mL] 550 mL PEEP:  [5 cmH20] 5 cmH20 Pressure Support:  [8 cmH20-10 cmH20] 10 cmH20 Plateau Pressure:  [22 cmH20-26 cmH20] 24 cmH20  INTAKE / OUTPUT:  Intake/Output Summary (Last 24 hours) at 12/03/16 0934 Last data filed at 12/03/16 0831  Gross per 24 hour  Intake          4147.61 ml  Output             3100 ml  Net          1047.61 ml     PHYSICAL EXAMINATION: General:  Awake, no distress, slight tachypnea on PS wean  Integument:  Warm. Dry. Right arm incision clean, dry, and intact. HEENT:  ETT, mm moist, no JVD Cardiovascular:  Considerable ectopy, occasional pauses  Pulmonary: resps even non labored on PS 10/5, RR 28 Clear bilaterally on auscultation. Abdomen: JVD drain in place. Soft. Normal bowel sounds. Protuberant. Neurological: Nods to questions. Following commands. Moving all 4  extremities.  LABS:  BMET  Recent Labs Lab 12/02/16 0400 12/02/16 1542 12/03/16 0430  NA 131* 130* 133*  K 2.3* 3.6 2.9*  CL 95* 96* 100*  CO2 22 22 22   BUN 85* 73* 71*  CREATININE 2.61* 2.17* 1.85*  GLUCOSE 142* 262* 244*    Electrolytes  Recent Labs Lab 12/01/16 0304 12/02/16 0400 12/02/16 1542 12/03/16 0430  CALCIUM 6.6* 6.5* 6.3* 6.9*  MG 2.7* 2.4  --  2.2  PHOS 3.5 2.4* 4.7* 2.4*    CBC  Recent Labs Lab 12/01/16 0304 12/02/16 0400 12/03/16 0430  WBC 13.8* 12.6* 19.2*  HGB 12.2* 10.6* 11.2*  HCT 35.3* 30.2* 31.6*  PLT 102* 93* 128*    Coag's  Recent Labs Lab 11/26/16 2315 11/30/16 1036  APTT  --  36  INR 1.23 1.35    Sepsis Markers  Recent Labs Lab 11/26/16 2342 11/27/16 0520  11/27/16 0740 12/01/16 0304 12/02/16 0400 12/03/16 0430  LATICACIDVEN 7.5* 3.0*  --  3.8*  --   --   --   PROCALCITON  --   --   < >  --  39.98 30.83 16.20  < > = values in this interval not displayed.  ABG  Recent Labs Lab 11/27/16 1010 11/28/16 0315 11/29/16 0510  PHART 7.230*  7.454* 7.452*  PCO2ART 44.7 27.9* 31.0*  PO2ART 123* 110* 117*    Liver Enzymes  Recent Labs Lab 12/01/16 0304 12/02/16 0400 12/02/16 1542 12/03/16 0430  AST 118* 173*  --  139*  ALT 88* 109*  --  121*  ALKPHOS 69 49  --  71  BILITOT 2.2* 2.3*  --  2.3*  ALBUMIN 2.0* 1.8* 1.7* 1.8*    Cardiac Enzymes  Recent Labs Lab 11/27/16 2042 11/28/16 0139 11/28/16 1206  TROPONINI 0.14* 0.17* 0.17*    Glucose  Recent Labs Lab 12/02/16 0736 12/02/16 1159 12/02/16 1544 12/02/16 1931 12/02/16 2340 12/03/16 0345  GLUCAP 160* 201* 261* 229* 242* 247*    Imaging No results found.  IMAGING/STUDIES: CTA CHEST/ABD/PELVIS 8/10: IMPRESSION: 1. Bowel perforation, with a large amount of free air and free fluid noted throughout the abdomen and pelvis. Tiny focus of air along the pylorus and proximal duodenum raises question for the site of perforation. This was  better characterized on subsequent surgery. 2. Vague mild soft tissue inflammation about the proximal ileum at the left mid abdomen. 3. No evidence of aortic dissection. No evidence of aneurysmal dilatation. Scattered aortic atherosclerosis. 4. No evidence of central pulmonary embolus. 5. Vague soft tissue inflammation about the bladder may be reactive in nature, or could reflect mild cystitis. 6. Cardiomegaly.  Scattered coronary artery calcification noted. 7. Trace right-sided pleural fluid. Patchy bibasilar airspace opacities likely reflect atelectasis. 8. Scattered diverticulosis along the ascending colon, without evidence of diverticulitis. 9. Borderline enlarged prostate. 10. Small to moderate bilateral inguinal hernias, containing only fat. TTE 8/12:  LV moderately dilated with EF 15% & diffuse hypokinesis. There is akinesis of the inferolateral and inferior myocardium. Study insufficient to assess diastolic function. LA & RA severely dilated. RV moderately dilated with moderately reduced systolic function. Pacer wires noted. Mild aortic regurgitation without stenosis. Aortic root normal in size. Moderate mitral regurgitation without stenosis. Trivial pulmonic regurgitation without stenosis. Moderate tricuspid regurgitation. No pericardial effusion. PORT CXR 8/13:  Previously reviewed by me. Silhouetting of left hemidiaphragm. Endotracheal tube in good position. Enteric feeding tube coursing below diaphragm. Right internal jugular central venous catheter in good position. Bilateral hilar fullness. CT HEAD W/O 8/14:  Decreased attenuation involving the right dentate nucleus in the right cerebellum and immediately adjacent cerebellar parenchyma, concerning for recent and potentially acute infarct in this area. Elsewhere there is atrophy with moderate periventricular small vessel disease. There is no intracranial mass, hemorrhage, or extra-axial fluid collection. Multiple foci of arterial vascular  calcification noted. Areas of paranasal sinus disease. Areas of right-sided mastoid disease. RUQ U/S 8/15: IMPRESSION: 1. No acute abnormality seen at the right upper quadrant. 2. Suggestion of fatty infiltration within the liver. 3. Tiny stones and mild sludge within the gallbladder. No definite evidence for obstruction or cholecystitis. PORT CXR 8/15:  Personally reviewed by me. Endotracheal tube in good position. Right internal jugular central venous catheter in good position. Enteric feeding tube coursing below diaphragm. Some improvement in bilateral lower lung zone patchy opacities. Blunting of left costophrenic angle consistent with small left pleural effusion.  MICROBIOLOGY: Blood Culture x2 8/9 >>>NEG Urine Culture 8/9:  E coli Abdominal Wound Culture 8/10:  Normal Skin Flora  MRSA PCR 8/10:  Negative  Blood Cultures x2 8/10 >>>NEG H. Pylori IgG:  5.00 (positive)  ANTIBIOTICS: Vancomycin 8/10 (x1 dose) Diflucan 8/10 - 8/15 Zosyn 8/10 >>> Flagyl 8/15 >>> Biaxin 8/15 >>>  SIGNIFICANT EVENTS: 08/10 - Admit w/ pneumoperitoneum >> taken to OR  emergently for omental patch of gastric ulcer perforation 08/11 - High grade fever to 105F. Stress-dosed steroid started. 08/12 - Stress-dosed steroids tapered to q12hr 08/13 - Transfer to [facility] for arterial embolectomy 08/14 - Off pressors. Stopped Steroids. Altered mentation that is worse post-op & without any drips. Heparin drip stopped w/ bleeding from op site. 08/15 - More awake & following commands. Started on triple therapy for H pylori  LINES/TUBES: R BRACHIAL ART LINE 8/10 (out) OETT 8/10 >>> R IJ DL CVL 9/60 >>> ABD JP Drain 8/10 >>> Foley >>> OGT >>>   ASSESSMENT / PLAN:  PULMONARY A: Acute hypoxic respiratory failure: Multifactorial.  P: Daily spontaneous breathing trial & pressure support weaning Continuing full ventilator support for rest  Intermittent chest x-ray imaging   CARDIOVASCULAR A: Right Upper  Extremity Arterial Clot:  Secondary to arterial line. S/P embolectomy w/ vein patch 8/13.  Shock: Multifactorial. Likely strong component of sepsis. Resolved. Biventricular Heart Failure H/O NICM:  S/P ICD 2014 EF 15% H/O Essential Hypertension H/O Hyperlipidemia Paroxysmal Atrial Fibrillation Elevated Troponin I:  Likely multifactorial given acute illness.  H/O PAD  P: Continuing to monitor the patient on telemetry Continuing aspirin via tube daily Monitoring vitals per unit protocol Goal MAP >65 & SBP >90 Vascular surgery signed off Holding heparin drip Correct electrolytes  Consider cardiology input   RENAL A: Acute renal failure: Improving. Hypokalemia: Replaced. Hyperkalemia: Resolved. Hyponatremia: Stable and mild.  P: Avoiding nephrotoxic agents Maintaining mean arterial pressure Trending urine output with Foley catheter Trending electrolytes and renal function daily   GASTROINTESTINAL A: Perforated Pyloric Channel Ulcer:  S/P Repair 8/10 by Dr. Ezzard Standing. H. pylori IgG elevated. Ileus:  Post-op. Improving. Severe Protein-Calorie Malnutrition Transaminitis:  Worsening. Question possible component of hepatotoxicity from Diflucan.   P: Trending hepatic function panel daily Checking acetaminophen level & acute hepatitis panel - neg Postoperative care per surgery service  HEME A: Leukocytosis:  Slowly improving. Likely secondary to sepsis. Thrombocytopenia:  Stable. Splenic sequestration versus consumption. Anemia: Mild. Likely secondary to blood loss.  P: Trending cell counts daily w/ CBC Transfusing for Hgb <7.0 or active bleeding  INFECTIOUS DISEASE A: Septic Shock: Shock resolved. Peritonitis:  Secondary to perforated ulcer. E coli UTI Helicobacter pylori infection  P: Continuing empiric Zosyn  Discontinuing Diflucan Adding Biaxin & Flagyl to regimen for treatment of H. pylori Awaiting finalization of cultures  ENDOCRINE A: Diabetes  Mellitus:  Glucose controlled. Hypoglycemia:  On 8/11.   P: Continuing Accu-Cheks every 4 hours Continuing sliding scale insulin for moderate algorithm  NEUROLOGIC A: Sedation on Ventilator Acute Encephalopathy: Acutely worse postsurgical.  New Cerebellar CVA H/O anxiety  H/O right hip pain & osteoarthritis H/O EtOH Use  P: Desired RASS:  0 to -1 Minimizing sedatives Fentanyl IV when necessary pain Versed IV when necessary sedation Continuing high-dose thiamine IV every 8 hours Consulting Neurology today  Prophylaxis:  SCDs, heparin Grimes q8hr, & Protonix IV q12hr. Diet:  NPO. Starting trickle tube feedings today. Continuing TPN.  Code Status:  Full Code per previous physician discussions. Disposition:  Patient remains critically ill in the ICU.  Family Update:  No family available 8/16  Dirk Dress, NP 12/03/2016  9:34 AM Pager: [phone] or [phone]

## 2016-12-03 NOTE — Progress Notes (Signed)
    Nurse called and reported loss of DP signal in left foot.  On exam patient was spontaneously moving his feet, the feet are cool to touch without wounds. Doppler AT signal monophasic and right DP  Patient is on increased dose of levophed at 26 mcg/min.     COLLINS, EMMA MAUREEN PA-C

## 2016-12-03 NOTE — Care Management Note (Signed)
Case Management Note  Patient Details  Name: Jacob Pittman MRN: [id number] Date of Birth: Oct 20, 1945  Subjective/Objective:   Pt admitted with abd pain - pt is now s/p gram patch repair of pyloric ulcer.  Pt remains intubated.  CM will continue to follow            Action/Plan:     Expected Discharge Date:   (UNKNOWN)               Expected Discharge Plan:  Home/Self Care  In-House Referral:     Discharge planning Services  CM Consult  Post Acute Care Choice:    Choice offered to:     DME Arranged:    DME Agency:     HH Arranged:    HH Agency:     Status of Service:  In process, will continue to follow  If discussed at Long Length of Stay Meetings, dates discussed:    Additional Comments:  Cherylann Parr, RN 12/03/2016, 1:24 PM

## 2016-12-03 NOTE — Progress Notes (Addendum)
PHARMACY - ADULT TOTAL PARENTERAL NUTRITION CONSULT NOTE   Pharmacy Consult for TPN Indication: Prolonged Ileus  Patient Measurements: Height: 5\' 8"  (172.7 cm) Weight: 221 lb 12.5 oz (100.6 kg) IBW/kg (Calculated) : 68.4 TPN AdjBW (KG): 76.4 Body mass index is 33.72 kg/m.  Assessment:  71 yo M presents with gastric ulcer perforation and peritonitis. PMH includes ischemic cardiomyopathy, DM, HTN, CHF, HLD, PVD. He underwent emergent ex-lap with omental patch repair 8/10. Had multi-organ failure post-op requiring intubation and pressors. Acute ischemia of R hand on 8/13 requiring transfer to [facility] for R brachial ulner/radial artery embolectomy. Now with significant ileus. Pharmacy consulted to start TPN. Per Nutrition note, pt usually has good appetite, starting having vomiting w/ any intake 2-3 days PTA.  GI: Post-op ileus improving. NG tube output decreased at in last 24hr. Abdomen distended but having BM - Last 8/15. Albumin low at 1.8. Prealbumin down to <5. Tolerating TF 8/15 - MD to advance to goal rate. PPI Endo: Hx DM. CBGs previously controlled but trend up today (229-247). Hydrocortisone d/c'd, on resistant scale SSI. D10 d/c'd 8/13 PM Insulin requirements in the past 24 hours: 43 units Lytes: K up 2.9 s/p total K runs x 9 (goal>/=4 w/ ileus), Na/Cl low but improving. Mg down 2.2 (goal >/=2 w/ ileus), Phos stable 2.4 s/p KPhos x 1. CoCa slightly low 8.6 s/p Ca gluconate 1g x 1 *TPN w/ no lytes 8/13, 8/14 Renal: AKI improving - SCr decreased to 1.85, CrCl ~2ml/min. UOP improved to 1.2 ml/kg/hr. BUN elevated but decreased to 71. NS at [facility] Pulm: Intubated on 30% of FiO2 Cards: Heparin gtt for Afib and ischemic limb d/c'd w/ post-op bleeding from incision/IV site. BP soft on norepi. Asa Hepatobil: Tbili elevated, stable 2.3 - not noted to be jaundiced per RN. LFTs mildly elevated, AST trend down 139, ALT trend up 121, TG 132>154 Neuro: CT suggests stroke, likely cardioembolic  per Neuro. Fentanyl/Versed PRN. GCS 12. RASS 0 (goal -1), CPOT 0. Thiamine q8h x 3 days ID: Zosyn D7 for bowel perforation, Ecoli UTI. Tx for Hpylori+. Afeb, WBC up 19.2. BCx and Abd wound Cx negative  Best Practices: hep SQ, MC, ppi TPN Access: Double lumen RIJ >> TPN start date: 8/13 >>  Nutritional Goals (per RD recommendation on 8/13): KCal: 1000-1270  Protein: 140 g  Current Nutrition:  NPO Clinimix E 5/15 at 60 ml/hr Vital HP at 20 ml/hr - tolerating, advancing to goal rate today  Plan:  -Per discussion with Dr. Corliss Skains - requests to d/c TPN after current bag runs out at 1800 with pt tolerating TF and advancing to goal rate -Communicated with RN to decrease TPN rate to 47ml/hr at 1600 x 2 hrs, then off -D/c TPN labs, consult -Monitor CBGs on q4h resistant SSI and adjust as needed -Watch lytes - K phos x 1; K runs x 6 ordered per MD   Babs Bertin, PharmD, BCPS Clinical Pharmacist Rx Phone # for today: [phone] After 3:30PM, please call Main Rx: [phone] 12/03/2016 8:15 AM

## 2016-12-03 NOTE — Progress Notes (Signed)
STROKE TEAM PROGRESS NOTE   HISTORY OF PRESENT ILLNESS (per record) Jacob Pittman is an 71 y.o. [facility] with worsening lower abd pain over 3 days, belching, vomiting x 3 days, and constipation (no BM x 2 weeks). In the ER he had a lactate of 9.4 and was given 4L IVF per sepsis protocol. He then became hypoxic with rhonchi and increased work of breathing. CT revealedpneumoperitoneum with moderate amount of free fluid due to a bowel perforation.   He was taken to the OR where he was found to have a perforated pyloric channel ulcer along with 2,411ml of gastric contents within the abdominal cavity. This was repaired and he was then transferred to the ICU.  Pt was septic post op and had delirium. Patient then found to have acute ischemia with threatened hand right arm needing a Right brachial ulnar and radial artery embolectomy. Patient remained critically ill and poor condition.    TTE 8/12: LV moderately dilated with EF 15% &diffuse hypokinesis. There is akinesis of the inferolateral and inferior myocardium. CT HEAD W/O 8/14: Decreased attenuation involving the right dentate nucleus in the right cerebellum and immediately adjacent cerebellar parenchyma, concerning for recent and potentially acute infarct in this area.   Neurology consulted for new cerebellar cva on CT     SUBJECTIVE (INTERVAL HISTORY) Daughter is at bedside. No acute events overnight. Pt still intubated but follows commands. He has swelling of both hands. Pt's TPn has been stopped per surgery- need to monitor mag and phos for refeeding syndrome. CT two days ago concerning for right cerebellar infarct. However, due to CT resolution at posterior fossa, will need to repeat CT head today.    OBJECTIVE Temp:  [96.3 F (35.7 C)-98 F (36.7 C)] 98 F (36.7 C) (08/16 0407) Pulse Rate:  [33-111] 73 (08/16 0747) Cardiac Rhythm: Atrial fibrillation (08/16 0600) Resp:  [18-31] 25 (08/16 0747) BP:  (55-122)/(28-90) 96/72 (08/16 0747) SpO2:  [90 %-100 %] 100 % (08/16 0747) FiO2 (%):  [30 %] 30 % (08/16 0747) Weight:  [100.6 kg (221 lb 12.5 oz)] 100.6 kg (221 lb 12.5 oz) (08/16 0430)  CBC:  Recent Labs Lab 12/02/16 0400 12/03/16 0430  WBC 12.6* 19.2*  NEUTROABS 10.4* 17.4*  HGB 10.6* 11.2*  HCT 30.2* 31.6*  MCV 94.7 95.2  PLT 93* 128*    Basic Metabolic Panel:  Recent Labs Lab 12/02/16 0400 12/02/16 1542 12/03/16 0430  NA 131* 130* 133*  K 2.3* 3.6 2.9*  CL 95* 96* 100*  CO2 22 22 22   GLUCOSE 142* 262* 244*  BUN 85* 73* 71*  CREATININE 2.61* 2.17* 1.85*  CALCIUM 6.5* 6.3* 6.9*  MG 2.4  --  2.2  PHOS 2.4* 4.7* 2.4*    Lipid Panel:    Component Value Date/Time   CHOL 195 09/10/2016 1002   TRIG 154 (H) 12/02/2016 0400   HDL 42.00 09/10/2016 1002   CHOLHDL 5 09/10/2016 1002   VLDL 18.6 09/10/2016 1002   LDLCALC 135 (H) 09/10/2016 1002   HgbA1c:  Lab Results  Component Value Date   HGBA1C 5.8 (H) 11/27/2016   Urine Drug Screen: No results found for: LABOPIA, COCAINSCRNUR, LABBENZ, AMPHETMU, THCU, LABBARB  Alcohol Level     Component Value Date/Time   [facility]  06/10/2010 1129    <5        LOWEST DETECTABLE LIMIT FOR SERUM ALCOHOL IS 5 mg/dL FOR MEDICAL PURPOSES ONLY    IMAGING I have personally reviewed the radiological  images below and agree with the radiology interpretations.  Ct Head Wo Contrast 12/01/2016 IMPRESSION: Decreased attenuation involving the right dentate nucleus in the right cerebellum and immediately adjacent cerebellar parenchyma, concerning for recent and potentially acute infarct in this area. Elsewhere there is atrophy with moderate periventricular small vessel disease. There is no intracranial mass, hemorrhage, or extra-axial fluid collection. Multiple foci of arterial vascular calcification noted. Areas of paranasal sinus disease. Areas of right-sided mastoid disease. Electronically Signed   By: Bretta Bang III M.D.   On:  12/01/2016 13:57   Ct Head Wo Contrast 12/03/2016 IMPRESSION: 1. No acute finding. No acute infarct detected in the cerebellum as questioned previously. 2. Small remote bilateral cerebellar infarcts. Chronic small vessel ischemia in the cerebral white matter.   TTE  - Left ventricle: The cavity size was moderately dilated. Wall   thickness was normal. Systolic function was severely reduced. The   estimated ejection fraction was 15%. Diffuse hypokinesis. There   is akinesis of the inferolateral and inferior myocardium. The   study is not technically sufficient to allow evaluation of LV   diastolic function. - Aortic valve: There was mild regurgitation. - Mitral valve: There was moderate regurgitation. - Left atrium: The atrium was severely dilated. - Right ventricle: The cavity size was moderately dilated. Systolic   function was moderately reduced. - Right atrium: The atrium was severely dilated. - Tricuspid valve: There was moderate regurgitation. - Pulmonary arteries: Systolic pressure was moderately increased.  Impressions:  - Severe LV dysfunction; moderate LVE; mild AI; moderate MR; severe   biatrial enlargement; moderate RVE with moderately reduced   function; moderate TR; moderate elevation in pulmonary pressure.   PHYSICAL EXAM  Temp:  [97.5 F (36.4 C)-99.1 F (37.3 C)] 98.2 F (36.8 C) (08/16 1521) Pulse Rate:  [33-93] 59 (08/16 1745) Resp:  [18-34] 27 (08/16 1745) BP: (55-121)/(26-83) 81/26 (08/16 1745) SpO2:  [90 %-100 %] 100 % (08/16 1745) FiO2 (%):  [30 %] 30 % (08/16 1515) Weight:  [221 lb 12.5 oz (100.6 kg)] 221 lb 12.5 oz (100.6 kg) (08/16 0430)  General - Well nourished, well developed, intubated.  Ophthalmologic - Fundi not visualized due to noncooperation.  Cardiovascular - irregularly irregular heart rate and rhythm.  Neuro - intubated and not on sedation, eyes open, awake and alert, follows simple commands. PERRL, attending to both eyes, blinking  to visual threat bilaterally. Facial symmetry not able to test due to ET tube. Bilateral upper extremity 4/5 proximally, 3/5 distally with bilateral hand swelling. Bilateral lower extremity 3/5, symmetrical. DTR 1+, no Babinski. Sensation symmetrical. Coordination not cooperative. Gait not tested.   ASSESSMENT/PLAN [name] is a 71 y.o. male with history of ICM, Dm, HTn, presented with gastric ulcer perforation and peritonitis - had emergent laparotomy with omental patch repair. Neurology consulted for abnormal head CT.  No acute stroke- right cerebellar hypodensity seen in initial CT was artifact   CT head 8/14: Decreased attenuation involving the right dentate nucleus in the right cerebellum and immediately adjacent cerebellar parenchyma, concerning for recent and potentially acute infarct in this area.  Repeat CT head 8/16: No acute finding. No acute infarct detected in the cerebellum as questioned previously. Small remote bilateral cerebellar infarcts.   2D Echo  EF 15%  Diffuse hypokinesis   Pacemaker interrogation pending  LDL 135 (09/10/2016)  HgbA1c 5.8  Heparin sq for VTE prophylaxis  Diet NPO time specified  .TPN (CLINIMIX-E) Adult  aspirin 81 mg daily prior to admission,  now on aspirin 81 mg daily. Since there is no acute stroke, recommend to resume anticoagulation for low EF and likely afib on tele.  Patient counseled to be compliant with his antithrombotic medications  Ongoing aggressive stroke risk factor management  Therapy recommendations:  pending  Disposition:  Pending  Likely afib on tele  Irregularly irregular heart rate and rhythm  ICD interrogation pending  Recommend to resume anticoagulation  Hypotension  Hypotensive 88/45   On pressor  Long-term BP goal normotensive  Hyperlipidemia  Home meds:  crestor 20,  LDL 135, goal < 70  Recommend to Continue the above  Continue statin at discharge  Diabetes  HgbA1c 5.8, goal <  7.0  Controlled  Other Stroke Risk Factors  Advanced age  Former Cigarette smoker  ETOH use, advised to drink no more than 1-2 drink(s) a day  Obesity, Body mass index is 33.72 kg/m., recommend weight loss, diet and exercise as appropriate   Coronary artery disease  Other Active Problems  Acute hypoxic respiratory failure- per CCM- continuing pressure support wean and daily SBTs  Acute renal failure: improving  Shock- weaning vasopressors  Peritonitis- continuing broad spectrum abx- s/p ex-lap and gram patch repair POD#6   Malnutrition- tube feeds started today - monitor for refeeding syndrome  Hospital day # 6  Neurology will sign off. Please call with questions. No neurology follow-up needed at this time. Thanks for the consult.  Marvel Plan, MD PhD Stroke Neurology 12/03/2016 6:16 PM   To contact Stroke Continuity provider, please refer to [email or web address]. After hours, contact General Neurology

## 2016-12-03 NOTE — Progress Notes (Addendum)
eLink Physician-Brief Progress Note Patient Name: Jacob Pittman DOB: [birth year] MRN: [id number]   Date of Service  12/03/2016  HPI/Events of Note  Nurse reports that left foot is cold Pulse is evident through doppler.  Chart reviewed.  Today's CT head does not show any acute stroke. OK to start heparin per neuro.  eICU Interventions  Orders placed for heparin drip to be restarted. Monitor foot. May need to call vascular back tomorrow if there is no improvement.     Intervention Category Major Interventions: Other:  Nejla Reasor 12/03/2016, 6:30 PM

## 2016-12-03 NOTE — Progress Notes (Signed)
3 Days Post-Op   Subjective/Chief Complaint: Intubated but able to follow commands No indications of abdominal pain On tube feeds at 20 ml/hr Liquid bowel movements / Flexiseal   Objective: Vital signs in last 24 hours: Temp:  [96.3 F (35.7 C)-98 F (36.7 C)] 98 F (36.7 C) (08/16 0407) Pulse Rate:  [33-111] 54 (08/16 0815) Resp:  [18-31] 27 (08/16 0815) BP: (55-122)/(28-90) 86/39 (08/16 0815) SpO2:  [90 %-100 %] 100 % (08/16 0815) FiO2 (%):  [30 %] 30 % (08/16 0747) Weight:  [100.6 kg (221 lb 12.5 oz)] 100.6 kg (221 lb 12.5 oz) (08/16 0430) Last BM Date: 12/02/16  Intake/Output from previous day: 08/15 0701 - 08/16 0700 In: 4237.6 [I.V.:2613.6; NG/GT:410.7; IV Piggyback:1213.3] Out: 3320 [Urine:2885; Emesis/NG output:150; Drains:35; Stool:250] Intake/Output this shift: No intake/output data recorded.  General appearance: alert, cooperative and no distress GI: distended; non-tender; drain with clear serous fluid Wound - clean, dry subcutaneous tissues; minimal drainage  Lab Results:   Recent Labs  12/02/16 0400 12/03/16 0430  WBC 12.6* 19.2*  HGB 10.6* 11.2*  HCT 30.2* 31.6*  PLT 93* 128*   BMET  Recent Labs  12/02/16 1542 12/03/16 0430  NA 130* 133*  K 3.6 2.9*  CL 96* 100*  CO2 22 22  GLUCOSE 262* 244*  BUN 73* 71*  CREATININE 2.17* 1.85*  CALCIUM 6.3* 6.9*   PT/INR  Recent Labs  11/30/16 1036  LABPROT 16.8*  INR 1.35   ABG No results for input(s): PHART, HCO3 in the last 72 hours.  Invalid input(s): PCO2, PO2  Studies/Results: Ct Head Wo Contrast  Result Date: 12/01/2016 CLINICAL DATA:  Altered level of consciousness. Recent episode of hypertension. EXAM: CT HEAD WITHOUT CONTRAST TECHNIQUE: Contiguous axial images were obtained from the base of the skull through the vertex without intravenous contrast. COMPARISON:  June 10, 2010 FINDINGS: Brain: There is mild diffuse atrophy. There is no intracranial mass, hemorrhage, extra-axial  fluid collection, or midline shift. There is decreased attenuation involving the dentate nucleus of the right cerebellum and immediately adjacent cerebellar tissue, a finding concerning for recent infarct in the right mid cerebellar region. No other potential acute/recent infarct is demonstrated on this study. There is small vessel disease throughout the centra semiovale bilaterally, present on prior study. Vascular: No hyperdense vessel. There is calcification in both carotid siphon regions and more subtly in each middle cerebral artery. There is also calcification in the distal vertebral arteries. Skull: Bony calvarium appears intact. Sinuses/Orbits: There is mucosal thickening in the maxillary antra, more on the left than on the right, with a probable retention cyst in the inferior posterior left maxillary antrum. There is mucosal thickening in several ethmoid air cells bilaterally. Other paranasal sinuses appear clear. Orbits appear symmetric bilaterally. Other: There is mucosal thickening in several mastoid air cells on the right. Visualized mastoid air cells elsewhere clear. Patient is intubated. IMPRESSION: Decreased attenuation involving the right dentate nucleus in the right cerebellum and immediately adjacent cerebellar parenchyma, concerning for recent and potentially acute infarct in this area. Elsewhere there is atrophy with moderate periventricular small vessel disease. There is no intracranial mass, hemorrhage, or extra-axial fluid collection. Multiple foci of arterial vascular calcification noted. Areas of paranasal sinus disease. Areas of right-sided mastoid disease. Electronically Signed   By: Bretta Bang III M.D.   On: 12/01/2016 13:57   Dg Chest Port 1 View  Result Date: 12/02/2016 EXAM: PORTABLE CHEST 1 VIEW COMPARISON:  None. FINDINGS: Endotracheal tube, NG tube, right IJ line  stable position. Cardiac pacer stable position. Cardiomegaly with pulmonary vascular prominence and bilateral  interstitial prominence consistent with pulmonary interstitial edema. Small bilateral pleural effusions. No pneumothorax. IMPRESSION: 1.  Lines and tubes in stable position. 2. Cardiac pacer stable position. Cardiomegaly with pulmonary venous congestion and bilateral interstitial prominence consistent with pulmonary interstitial edema again noted. Small bilateral pleural effusions. Chest appears unchanged from prior exam . Electronically Signed   By: Maisie Fus  Register   On: 12/02/2016 07:31   US Abdomen Limited Ruq  Result Date: 12/02/2016 CLINICAL DATA:  Acute onset of transaminitis.  Initial encounter. EXAM: ULTRASOUND ABDOMEN LIMITED RIGHT UPPER QUADRANT COMPARISON:  None. FINDINGS: Gallbladder: Mild echogenic sludge and scattered tiny stones are noted within the gallbladder. No significant gallbladder wall thickening or pericholecystic fluid is seen. No ultrasonographic Murphy's sign is elicited. Common bile duct: Diameter: 0.3 cm, within normal limits in caliber. Liver: No focal lesion identified. Somewhat coarsened echotexture may reflect fatty infiltration. IMPRESSION: 1. No acute abnormality seen at the right upper quadrant. 2. Suggestion of fatty infiltration within the liver. 3. Tiny stones and mild sludge within the gallbladder. No definite evidence for obstruction or cholecystitis. Electronically Signed   By: Roanna Raider M.D.   On: 12/02/2016 01:07    Anti-infectives: Anti-infectives    Start     Dose/Rate Route Frequency Ordered Stop   12/02/16 1100  clarithromycin (BIAXIN) 250 MG/5ML suspension 500 mg     500 mg Per Tube Every 12 hours 12/02/16 1012     12/02/16 1045  metroNIDAZOLE (FLAGYL) 50 mg/ml oral suspension 500 mg     500 mg Per Tube 3 times daily 12/02/16 1036     12/02/16 1015  metroNIDAZOLE (FLAGYL) 50 mg/ml oral suspension 250 mg  Status:  Discontinued     250 mg Per Tube 4 times daily 12/02/16 1011 12/02/16 1036   11/28/16 0800  fluconazole (DIFLUCAN) IVPB 200 mg  Status:   Discontinued     200 mg 100 mL/hr over 60 Minutes Intravenous Every 24 hours 11/27/16 0624 12/02/16 1018   11/27/16 0630  fluconazole (DIFLUCAN) IVPB 400 mg     400 mg 100 mL/hr over 120 Minutes Intravenous  Once 11/27/16 0622 11/27/16 0841   11/27/16 0600  piperacillin-tazobactam (ZOSYN) IVPB 3.375 g     3.375 g 12.5 mL/hr over 240 Minutes Intravenous Every 8 hours 11/27/16 0534     11/27/16 0515  piperacillin-tazobactam (ZOSYN) IVPB 3.375 g  Status:  Discontinued     3.375 g 100 mL/hr over 30 Minutes Intravenous  Once 11/27/16 0510 11/27/16 0518   11/27/16 0030  piperacillin-tazobactam (ZOSYN) IVPB 3.375 g     3.375 g 100 mL/hr over 30 Minutes Intravenous  Once 11/27/16 0016 11/27/16 0755   11/27/16 0030  vancomycin (VANCOCIN) IVPB 1000 mg/200 mL premix     1,000 mg 200 mL/hr over 60 Minutes Intravenous  Once 11/27/16 0016 11/27/16 0756      Assessment/Plan: Acute ischemia right hand s/p R brachial ulnar and radial artery ebolectomy Septic shock 2/2 perf gastric ulcer - cont abx, on Levo Acute hypoxic respiratory failure - vent mgmt per CCM Hx PAD, sCHD, NICM s/p ICD placement 2014 HTN HLD AKI DM Acute encephalopathy - improved today -- off sedation  Perforated pyloric ulcer  POD#6 S/P EXPLORATORY LAPAROTOMY, GRAM PATCH REPAIR8/10/18 Dr. Ovidio Kin - bowel function returning - 2 BMs, decreasing NGT output. Drain output clear - may increase tube feeds to goal rate - continue BID wet to dry dressing  changes  - continue IV Protonix  Malnutrition: prealbumin <5 FEN: NPO, IVF, TNA, NGT - start TF; hypokalemia (2.3), Mg 2.4, hyponatremia (131), ID: WBC increasing- doubt intra-abdominal infection as patient has multiple other acute events that have occurred and he has regained bowel function.  However, if other sources ruled out, may consider CT Abd/ Pelvis VTE: SCD's, heparin    Dispo: advance tube feeds to goal rate  Continue current wound care   LOS: 6 days     Pio Eatherly K. 12/03/2016

## 2016-12-03 NOTE — Progress Notes (Signed)
Nutrition Follow-up  DOCUMENTATION CODES:   Obesity unspecified  INTERVENTION:   TPN d/c'ed per surgery  Advance Vital High Protein to 30 ml/hr (720 ml/day) 60 ml Prostat TID Provides: 1320 kcal, 153 grams protein, and 601 ml free water.   Monitor magnesium and phosphorus every 12 hours x 3 occurences, MD to replete as needed, as pt is at risk for refeeding syndrome given ETOH abuse.   NUTRITION DIAGNOSIS:   Inadequate oral intake related to inability to eat as evidenced by NPO status. Ongoing.  GOAL:   Provide needs based on ASPEN/SCCM guidelines Progressing.   MONITOR:   TF tolerance, Labs  ASSESSMENT:   71 y.o.M with CHF (EF 15%), HTN, DM, PAD, and OA. Pt admitted with peritonitis and septic shock secondary to perforated ulcer s/p exp lap oversew pyloric channel ulcer, graham omental patch repair for perforated ulcer. Pt transferred to [place] for brachial embolectomy 8/13.  Pt remains on vent support Spoke with Dr Corliss Skains, MD is ready to advance TF.   Of note MAP running low with support of levophed (goal MAP >65), phosphorus and potassium low but being repleted. Will order phosphorus and magnesium every 12 hours x 3 occurences per protocol.  CBG's also elevated on TPN, monitor off TPN  Medications reviewed and include: 10 mEq KCl x 6, received 500 mg thiamine (ended today), KPhos x 1, norepinephrine 22 mcg (increased this am at 6 am Labs reviewed: Na 133 (L), K+ 2.9 (L), PO4 2.4 (L) CBG's: 261-229-242-247 MAP: 58-58-50-64  TF: Vital High Protein @ 20 ml/hr TPN: Clinimix E 5/15 @ 60 ml/hr, decrease to 30 ml/hr x 2 hours, then off  Diet Order:  Diet NPO time specified .TPN (CLINIMIX-E) Adult  Skin:   (abd and R arm incisions)  Last BM:  8/16 250 ml via rectal tube  Height:   Ht Readings from Last 1 Encounters:  12/03/16 5\' 8"  (1.727 m)    Weight:   Wt Readings from Last 1 Encounters:  12/03/16 221 lb 12.5 oz (100.6 kg)    Ideal Body Weight:  70  kg  BMI:  Body mass index is 33.72 kg/m.  Estimated Nutritional Needs:   Kcal:  1000-1270 (11-14 kcal/kg)  Protein:  140 grams (2 grams/kg IBW)  Fluid:  >/= 1.5 L/day  EDUCATION NEEDS:   No education needs identified at this time  Kendell Bane RD, LDN, CNSC [phone] Pager [phone] After Hours Pager

## 2016-12-03 NOTE — Progress Notes (Signed)
RT note- Patient taken to CT on full support and returned and placed back to wean, continue to monitor.

## 2016-12-03 NOTE — Progress Notes (Signed)
ANTICOAGULATION CONSULT NOTE - Follow-Up Consult  Pharmacy Consult for Heparin Indication: atrial fibrillation, ischemic limb  No Known Allergies  Patient Measurements: Height: _0  (172.7 cm) Weight: 221 lb 12.5 oz (100.6 kg) IBW/kg (Calculated) : 68.4 Heparin Dosing Weight: 89.6 kg  Vital Signs: Temp: 98.2 F (36.8 C) (08/16 1521) Temp Source: Oral (08/16 1521) BP: 94/72 (08/16 1900) Pulse Rate: 53 (08/16 1900)  Labs:  Recent Labs  11/30/16 2124  12/01/16 0304 12/02/16 0400 12/02/16 1542 12/03/16 0430  HGB  --   < > 12.2* 10.6*  --  11.2*  HCT  --   --  35.3* 30.2*  --  31.6*  PLT  --   --  102* 93*  --  128*  HEPARINUNFRC 0.23*  --   --   --   --   --   CREATININE 2.98*  < > 3.02* 2.61* 2.17* 1.85*  < > = values in this interval not displayed.  Estimated Creatinine Clearance: 40.9 mL/min (A) (by C-G formula based on SCr of 1.85 mg/dL (H)).   Medical History: Past Medical History:  Diagnosis Date  . ANXIETY 02/04/2009  . CHF (congestive heart failure) ([place])   . Chronic systolic CHF (congestive heart failure) ([place])   . DIABETES MELLITUS, TYPE II 11/07/2008  . 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 HIP PAIN, RIGHT 11/06/2009  . HYPERLIPIDEMIA 11/07/2008  . HYPERTENSION 10/08/2008  . Nonischemic cardiomyopathy [facility])    s/p ICD implantation 03-2013 by Dr Lovena Le  . OSTEOARTHRITIS, KNEE, RIGHT 10/08/2008  . Peripheral arterial disease (HCC)     Medications:  Scheduled:  . aspirin  81 mg Per Tube Daily  . chlorhexidine gluconate (MEDLINE KIT)  15 mL Mouth Rinse BID  . Chlorhexidine Gluconate Cloth  6 each Topical Daily  . clarithromycin  500 mg Per Tube Q12H  . feeding supplement (PRO-STAT SUGAR FREE 64)  60 mL Per Tube TID  . feeding supplement (VITAL HIGH PROTEIN)  1,000 mL Per Tube Q24H  . insulin aspart  0-20 Units Subcutaneous Q4H  . mouth rinse  15 mL Mouth Rinse QID  . metroNIDAZOLE  500 mg Per Tube TID  . pantoprazole (PROTONIX) IV  40 mg  Intravenous Q12H  . sodium chloride flush  10-40 mL Intracatheter Q12H   Infusions:  . sodium chloride 250 mL (12/03/16 0700)  . sodium chloride 500 mL (12/03/16 0126)  . heparin    . norepinephrine (LEVOPHED) Adult infusion 26 mcg/min (12/03/16 1808)  . piperacillin-tazobactam (ZOSYN)  IV 3.375 g (12/03/16 1522)  . thiamine injection Stopped (12/03/16 1419)   PRN: sodium chloride, acetaminophen, artificial tears, fentaNYL (SUBLIMAZE) injection, midazolam, ondansetron **OR** ondansetron (ZOFRAN) IV, sodium chloride flush  Assessment: 71 yo male admitted 8/9 with gastric ulcer perforation and peritonitis. Underwent emergent laparotomy with omental patch repair. Post op course complicated by multiorgan failure requiring intubation and pressors. On 8/13, Pharmacy consulted to dose IV heparin for afib and presumed ischemic right upper extremity.   Pt now s/p brachial embolectomy 8/13 and found to have bleeding from incision site and IV site - heparin held overnight per VVS. 8/14 morning CBC stable but bleeding continues, will hold heparin another 24 hours per VVS and reassess in morning. 8/15 heparin remains on hold per neuro for possible CVA, 8/16 head CT ok no bleeding no acute stroke noted - CCM and neuro discussed and decision to restart heparin.  Goal of Therapy:  Heparin level 0.3-0.5 - will use lower end of therapeutic goal Monitor  platelets by anticoagulation protocol: Yes   Plan:  No bolus Restart heparin drip 1300 uts/hr  HL in 6hr from start  Daily HL, CBC   Bonnita Nasuti Pharm.D. CPP, BCPS Clinical Pharmacist [phone] 12/03/2016 7:43 PM

## 2016-12-04 LAB — MAGNESIUM
MAGNESIUM: 2 mg/dL (ref 1.7–2.4)
MAGNESIUM: 2 mg/dL (ref 1.7–2.4)

## 2016-12-04 LAB — CBC WITH DIFFERENTIAL/PLATELET
BASOS PCT: 0 %
Basophils Absolute: 0 10*3/uL (ref 0.0–0.1)
EOS PCT: 0 %
Eosinophils Absolute: 0 10*3/uL (ref 0.0–0.7)
HEMATOCRIT: 32.7 % — AB (ref 39.0–52.0)
HEMOGLOBIN: 11.5 g/dL — AB (ref 13.0–17.0)
LYMPHS PCT: 7 %
Lymphs Abs: 1.5 10*3/uL (ref 0.7–4.0)
MCH: 33.7 pg (ref 26.0–34.0)
MCHC: 35.2 g/dL (ref 30.0–36.0)
MCV: 95.9 fL (ref 78.0–100.0)
MONOS PCT: 4 %
Monocytes Absolute: 0.9 10*3/uL (ref 0.1–1.0)
NEUTROS ABS: 19.6 10*3/uL — AB (ref 1.7–7.7)
NEUTROS PCT: 89 %
Platelets: 242 10*3/uL (ref 150–400)
RBC: 3.41 MIL/uL — ABNORMAL LOW (ref 4.22–5.81)
RDW: 14.2 % (ref 11.5–15.5)
WBC: 22 10*3/uL — ABNORMAL HIGH (ref 4.0–10.5)

## 2016-12-04 LAB — BASIC METABOLIC PANEL
Anion gap: 9 (ref 5–15)
BUN: 47 mg/dL — AB (ref 6–20)
CHLORIDE: 105 mmol/L (ref 101–111)
CO2: 22 mmol/L (ref 22–32)
CREATININE: 1.41 mg/dL — AB (ref 0.61–1.24)
Calcium: 7.4 mg/dL — ABNORMAL LOW (ref 8.9–10.3)
GFR, EST AFRICAN AMERICAN: 56 mL/min — AB (ref >60)
GFR, EST NON AFRICAN AMERICAN: 48 mL/min — AB (ref >60)
Glucose, Bld: 169 mg/dL — ABNORMAL HIGH (ref 65–99)
POTASSIUM: 3.3 mmol/L — AB (ref 3.5–5.1)
SODIUM: 136 mmol/L (ref 135–145)

## 2016-12-04 LAB — GLUCOSE, CAPILLARY
GLUCOSE-CAPILLARY: 114 mg/dL — AB (ref 65–99)
GLUCOSE-CAPILLARY: 148 mg/dL — AB (ref 65–99)
GLUCOSE-CAPILLARY: 197 mg/dL — AB (ref 65–99)
GLUCOSE-CAPILLARY: 244 mg/dL — AB (ref 65–99)
Glucose-Capillary: 156 mg/dL — ABNORMAL HIGH (ref 65–99)
Glucose-Capillary: 173 mg/dL — ABNORMAL HIGH (ref 65–99)

## 2016-12-04 LAB — HEPARIN LEVEL (UNFRACTIONATED)
HEPARIN UNFRACTIONATED: 0.44 [IU]/mL (ref 0.30–0.70)
Heparin Unfractionated: 0.28 IU/mL — ABNORMAL LOW (ref 0.30–0.70)
Heparin Unfractionated: 0.29 IU/mL — ABNORMAL LOW (ref 0.30–0.70)

## 2016-12-04 LAB — COMPREHENSIVE METABOLIC PANEL
ALBUMIN: 1.9 g/dL — AB (ref 3.5–5.0)
ALT: 105 U/L — AB (ref 17–63)
AST: 92 U/L — AB (ref 15–41)
Alkaline Phosphatase: 94 U/L (ref 38–126)
Anion gap: 10 (ref 5–15)
BUN: 55 mg/dL — AB (ref 6–20)
CHLORIDE: 101 mmol/L (ref 101–111)
CO2: 24 mmol/L (ref 22–32)
CREATININE: 1.46 mg/dL — AB (ref 0.61–1.24)
Calcium: 7.4 mg/dL — ABNORMAL LOW (ref 8.9–10.3)
GFR calc Af Amer: 53 mL/min — ABNORMAL LOW (ref >60)
GFR, EST NON AFRICAN AMERICAN: 46 mL/min — AB (ref >60)
GLUCOSE: 109 mg/dL — AB (ref 65–99)
POTASSIUM: 2.9 mmol/L — AB (ref 3.5–5.1)
Sodium: 135 mmol/L (ref 135–145)
Total Bilirubin: 2.4 mg/dL — ABNORMAL HIGH (ref 0.3–1.2)
Total Protein: 5.4 g/dL — ABNORMAL LOW (ref 6.5–8.1)

## 2016-12-04 LAB — C DIFFICILE QUICK SCREEN W PCR REFLEX
C DIFFICILE (CDIFF) TOXIN: NEGATIVE
C DIFFICLE (CDIFF) ANTIGEN: NEGATIVE
C Diff interpretation: NOT DETECTED

## 2016-12-04 LAB — HEMOGLOBIN AND HEMATOCRIT, BLOOD
HEMATOCRIT: 31.8 % — AB (ref 39.0–52.0)
HEMOGLOBIN: 11 g/dL — AB (ref 13.0–17.0)

## 2016-12-04 LAB — PHOSPHORUS
PHOSPHORUS: 1.8 mg/dL — AB (ref 2.5–4.6)
Phosphorus: 2.4 mg/dL — ABNORMAL LOW (ref 2.5–4.6)

## 2016-12-04 LAB — TROPONIN I
TROPONIN I: 0.1 ng/mL — AB (ref <0.03)
Troponin I: 0.11 ng/mL (ref <0.03)
Troponin I: 0.13 ng/mL (ref <0.03)

## 2016-12-04 MED ORDER — CLARITHROMYCIN 500 MG PO TABS
500.0000 mg | ORAL_TABLET | Freq: Two times a day (BID) | ORAL | Status: AC
  Administered 2016-12-04 – 2016-12-16 (×25): 500 mg
  Filled 2016-12-04 (×27): qty 1

## 2016-12-04 MED ORDER — METRONIDAZOLE 500 MG PO TABS
500.0000 mg | ORAL_TABLET | Freq: Three times a day (TID) | ORAL | Status: DC
  Administered 2016-12-04 – 2016-12-09 (×15): 500 mg
  Filled 2016-12-04 (×20): qty 1

## 2016-12-04 MED ORDER — DEXTROSE 5 % IV SOLN
30.0000 mmol | Freq: Once | INTRAVENOUS | Status: AC
  Administered 2016-12-04: 30 mmol via INTRAVENOUS
  Filled 2016-12-04: qty 10

## 2016-12-04 MED ORDER — ACETAMINOPHEN 160 MG/5ML PO SOLN
650.0000 mg | ORAL | Status: DC | PRN
  Administered 2016-12-04 – 2016-12-20 (×15): 650 mg
  Filled 2016-12-04 (×18): qty 20.3

## 2016-12-04 MED ORDER — PIPERACILLIN-TAZOBACTAM 3.375 G IVPB
3.3750 g | Freq: Three times a day (TID) | INTRAVENOUS | Status: AC
  Administered 2016-12-04 – 2016-12-08 (×13): 3.375 g via INTRAVENOUS
  Filled 2016-12-04 (×14): qty 50

## 2016-12-04 MED ORDER — POTASSIUM CHLORIDE 20 MEQ/15ML (10%) PO SOLN
40.0000 meq | ORAL | Status: AC
  Administered 2016-12-04 (×2): 40 meq
  Filled 2016-12-04 (×2): qty 30

## 2016-12-04 NOTE — Progress Notes (Addendum)
PULMONARY / CRITICAL CARE MEDICINE   Name: Jacob Pittman MRN: [id number] DOB: 03-01-1946    ADMISSION DATE:  11/26/2016   CONSULTATION DATE:  11/27/2016  REFERRING MD:  Ezzard Standing  CHIEF COMPLAINT:  Abdominal Pain  HISTORY OF PRESENT ILLNESS:  71 y.o. man with ischemic cardiomyopathy, diabetic hypertensive in poor health, admitted 8/9 with gastric ulcer perforation and peritonitis. Underwent emergent laparotomy with omental patch repair He was in [place] shock requiring epinephrine and Levophed drip. Course c/b acute ischemia of R arm requiring tx from [facility] to [facility] and urgent OR embolectomy, also c/b slow vent wean.   SUBJECTIVE:  No acute events overnight. Bowel function steadily returning with excessive output.   REVIEW OF SYSTEMS:  Unable to obtain with patient intubated.   VITAL SIGNS: BP 101/69 (BP Location: Right Leg)   Pulse (!) 106   Temp 100 F (37.8 C) (Oral)   Resp (!) 26   Ht 5\' 8"  (1.727 m)   Wt 216 lb 0.8 oz (98 kg)   SpO2 100%   BMI 32.85 kg/m   HEMODYNAMICS:    VENTILATOR SETTINGS: Vent Mode: CPAP;PSV FiO2 (%):  [30 %] 30 % Set Rate:  [25 bmp] 25 bmp Vt Set:  [550 mL] 550 mL PEEP:  [5 cmH20] 5 cmH20 Pressure Support:  [8 cmH20-10 cmH20] 8 cmH20 Plateau Pressure:  [22 cmH20-27 cmH20] 23 cmH20  INTAKE / OUTPUT:  Intake/Output Summary (Last 24 hours) at 12/04/16 1027 Last data filed at 12/04/16 0918  Gross per 24 hour  Intake          3434.69 ml  Output             4309 ml  Net          -874.31 ml     PHYSICAL EXAMINATION: General:  Awake, no distress, slight tachypnea on PS wean  Integument:  Warm. Dry. Right arm incision clean, dry, and intact. HEENT:  ETT, mm moist, no JVD Cardiovascular:  Considerable ectopy, occasional pauses  Pulmonary: resps even non labored on PS 10/5, RR 28 Clear bilaterally on auscultation. Abdomen: JVD drain in place. Soft. Normal bowel sounds. Protuberant. Neurological: Nods to questions. Following commands. Moving all 4  extremities.  LABS:  BMET  Recent Labs Lab 12/02/16 1542 12/03/16 0430 12/04/16 0448  NA 130* 133* 135  K 3.6 2.9* 2.9*  CL 96* 100* 101  CO2 22 22 24   BUN 73* 71* 55*  CREATININE 2.17* 1.85* 1.46*  GLUCOSE 262* 244* 109*    Electrolytes  Recent Labs Lab 12/02/16 0400 12/02/16 1542 12/03/16 0430 12/04/16 0448  CALCIUM 6.5* 6.3* 6.9* 7.4*  MG 2.4  --  2.2 2.0  PHOS 2.4* 4.7* 2.4* 2.4*    CBC  Recent Labs Lab 12/02/16 0400 12/03/16 0430 12/04/16 0448  WBC 12.6* 19.2* 22.0*  HGB 10.6* 11.2* 11.5*  HCT 30.2* 31.6* 32.7*  PLT 93* 128* 242    Coag's  Recent Labs Lab 11/30/16 1036  APTT 36  INR 1.35    Sepsis Markers  Recent Labs Lab 12/01/16 0304 12/02/16 0400 12/03/16 0430  PROCALCITON 39.98 30.83 16.20    ABG  Recent Labs Lab 11/28/16 0315 11/29/16 0510  PHART 7.454* 7.452*  PCO2ART 27.9* 31.0*  PO2ART 110* 117*    Liver Enzymes  Recent Labs Lab 12/02/16 0400 12/02/16 1542 12/03/16 0430 12/04/16 0448  AST 173*  --  139* 92*  ALT 109*  --  121* 105*  ALKPHOS 49  --  71 94  BILITOT 2.3*  --  2.3* 2.4*  ALBUMIN 1.8* 1.7* 1.8* 1.9*    Cardiac Enzymes  Recent Labs Lab 11/27/16 2042 11/28/16 0139 11/28/16 1206  TROPONINI 0.14* 0.17* 0.17*    Glucose  Recent Labs Lab 12/03/16 1244 12/03/16 1524 12/03/16 1931 12/03/16 2342 12/04/16 0346 12/04/16 0842  GLUCAP 284* 254* 184* 100* 114* 156*    Imaging Ct Head Wo Contrast  Result Date: 12/03/2016 CLINICAL DATA:  Stroke follow-up EXAM: CT HEAD WITHOUT CONTRAST TECHNIQUE: Contiguous axial images were obtained from the base of the skull through the vertex without intravenous contrast. COMPARISON:  12/01/2016 FINDINGS: Brain: No acute infarct is identified. Symmetric normal density of the bilateral deep cerebral white matter which was highlighted previously. Small remote bilateral peripheral cerebellar infarcts. There is chronic microvascular ischemic gliosis with  confluent low-density greatest in the frontal periventricular white matter. Generalized atrophy. No hemorrhage, hydrocephalus, or masslike findings. Vascular: Atherosclerotic calcification. Skull: No acute or aggressive finding. Sinuses/Orbits: Essentially clear sinuses and mastoids. Nasal and oral intubation. IMPRESSION: 1. No acute finding. No acute infarct detected in the cerebellum as questioned previously. 2. Small remote bilateral cerebellar infarcts. Chronic small vessel ischemia in the cerebral white matter. Electronically Signed   By: Marnee Spring M.D.   On: 12/03/2016 13:15    IMAGING/STUDIES: CTA CHEST/ABD/PELVIS 8/10: IMPRESSION: 1. Bowel perforation, with a large amount of free air and free fluid noted throughout the abdomen and pelvis. Tiny focus of air along the pylorus and proximal duodenum raises question for the site of perforation. This was better characterized on subsequent surgery. 2. Vague mild soft tissue inflammation about the proximal ileum at the left mid abdomen. 3. No evidence of aortic dissection. No evidence of aneurysmal dilatation. Scattered aortic atherosclerosis. 4. No evidence of central pulmonary embolus. 5. Vague soft tissue inflammation about the bladder may be reactive in nature, or could reflect mild cystitis. 6. Cardiomegaly.  Scattered coronary artery calcification noted. 7. Trace right-sided pleural fluid. Patchy bibasilar airspace opacities likely reflect atelectasis. 8. Scattered diverticulosis along the ascending colon, without evidence of diverticulitis. 9. Borderline enlarged prostate. 10. Small to moderate bilateral inguinal hernias, containing only fat. TTE 8/12:  LV moderately dilated with EF 15% & diffuse hypokinesis. There is akinesis of the inferolateral and inferior myocardium. Study insufficient to assess diastolic function. LA & RA severely dilated. RV moderately dilated with moderately reduced systolic function. Pacer wires noted. Mild  aortic regurgitation without stenosis. Aortic root normal in size. Moderate mitral regurgitation without stenosis. Trivial pulmonic regurgitation without stenosis. Moderate tricuspid regurgitation. No pericardial effusion. PORT CXR 8/13:  Previously reviewed by me. Silhouetting of left hemidiaphragm. Endotracheal tube in good position. Enteric feeding tube coursing below diaphragm. Right internal jugular central venous catheter in good position. Bilateral hilar fullness. CT HEAD W/O 8/14:  Decreased attenuation involving the right dentate nucleus in the right cerebellum and immediately adjacent cerebellar parenchyma, concerning for recent and potentially acute infarct in this area. Elsewhere there is atrophy with moderate periventricular small vessel disease. There is no intracranial mass, hemorrhage, or extra-axial fluid collection. Multiple foci of arterial vascular calcification noted. Areas of paranasal sinus disease. Areas of right-sided mastoid disease. RUQ U/S 8/15: IMPRESSION: 1. No acute abnormality seen at the right upper quadrant. 2. Suggestion of fatty infiltration within the liver. 3. Tiny stones and mild sludge within the gallbladder. No definite evidence for obstruction or cholecystitis. PORT CXR 8/15:  Previously reviewed by me. Endotracheal tube in good position. Right internal jugular central venous catheter in  good position. Enteric feeding tube coursing below diaphragm. Some improvement in bilateral lower lung zone patchy opacities. Blunting of left costophrenic angle consistent with small left pleural effusion. CT HEAD W/O 8/16: IMPRESSION: 1. No acute finding. No acute infarct detected in the cerebellum as questioned previously. 2. Small remote bilateral cerebellar infarcts. Chronic small vessel ischemia in the cerebral white matter.  MICROBIOLOGY: Blood Culture x2 8/9 >>>NEG Urine Culture 8/9:  E coli Abdominal Wound Culture 8/10:  Normal Skin Flora  MRSA PCR 8/10:  Negative   Blood Cultures x2 8/10:  Negative  H. Pylori IgG:  5.00 (positive) Acute Hepatitis Panel 8/15:  Negative   ANTIBIOTICS: Vancomycin 8/10 (x1 dose) Diflucan 8/10 - 8/15 Zosyn 8/10 >>> Flagyl 8/15 >>> Biaxin 8/15 >>>  SIGNIFICANT EVENTS: 08/10 - Admit w/ pneumoperitoneum >> taken to OR emergently for omental patch of gastric ulcer perforation 08/11 - High grade fever to 105F. Stress-dosed steroid started. 08/12 - Stress-dosed steroids tapered to q12hr 08/13 - Transfer to [facility] for arterial embolectomy 08/14 - Off pressors. Stopped Steroids. Altered mentation that is worse post-op & without any drips. Heparin drip stopped w/ bleeding from op site. 08/15 - More awake & following commands. Started on triple therapy for H pylori 08/16 - No evidence of CVA on repeat CT. Heparin drip restarted for possible leg ishcemia. 08/17 - Advancing tube feedings while weaning TPN. Worsening shock.  LINES/TUBES: R BRACHIAL ART LINE 8/10 (out) OETT 8/10 >>> R IJ DL CVL 4/09 >>> ABD JP Drain 8/10 >>> Foley >>> OGT >>>  ASSESSMENT / PLAN:  PULMONARY A: Acute hypoxic respiratory failure: Multifactorial.  P: Daily spontaneous breathing trial & pressure support weaning Continuing full ventilator support for rest  Intermittent chest x-ray imaging   CARDIOVASCULAR A: Right Upper Extremity Arterial Clot:  Secondary to arterial line. S/P embolectomy w/ vein patch 8/13.  Shock:  Worsening.  Biventricular Heart Failure H/O NICM:  S/P ICD 2014 EF 15% H/O Essential Hypertension H/O Hyperlipidemia Paroxysmal Atrial Fibrillation Elevated Troponin I:  Likely multifactorial given acute illness.  H/O PAD  P: Continuing vasopressor infusion Goal MAP >65 & SBP >90 Trending troponin I every 6 hours Continuing heparin drip Monitoring vitals per unit protocol Continuous telemetry monitoring  RENAL A: Acute renal failure: Resolving.  Hypokalemia: Replaced. Hyperkalemia: Resolved. Hyponatremia:  Resolved.  P: KCl 80 mEq via tube today Repeat BMP at 5 PM Monitoring electrolytes and renal function daily Trending urine output with Foley  GASTROINTESTINAL A: Perforated Pyloric Channel Ulcer:  S/P Repair 8/10 by Dr. Ezzard Standing. H. pylori IgG elevated. Ileus:  Post-op. Improving. Severe Protein-Calorie Malnutrition Transaminitis:  Improving. Suspect hepatotoxicity from Diflucan.   P: Trending liver function testing intermittently Advancing enteral feeding while weaning TPN today Postoperative care per surgery service  HEME A: Leukocytosis: Steadily worsening. Unclear etiology.   Thrombocytopenia:  Resolved. Likely due to splenic sequestration & consumption. Anemia: Hemoglobin stable. No signs of active bleeding at this time.  P: Trending cell counts daily w/ CBC Transfusing for Hgb <7.0 or active bleeding  INFECTIOUS DISEASE A: Septic Shock: Shock resolved. Peritonitis:  Secondary to perforated ulcer. E coli UTI Helicobacter pylori infection  P: Continuing empiric Zosyn, Biaxin, & Flagyl Plan to repeat culture for fever Checking stool for C diff  ENDOCRINE A: Diabetes Mellitus:  Glucose controlled. Hypoglycemia:  On 8/11.   P: Continuing Accu-Cheks every 4 hours Continuing sliding scale insulin for moderate algorithm  NEUROLOGIC A: Sedation on Ventilator Acute Encephalopathy: Significantly improved. S/P high dose IV thiamine.  Possible Cerebellar CVA:  Not seen on repeat CT. Unable to get MRI w/ implanted device. Neurology previously consulted. H/O anxiety  H/O right hip pain & osteoarthritis H/O EtOH Use  P: Desired RASS:  0 to -1 Fentanyl IV prn Versed IV prn Avoiding oversedation  Prophylaxis:  Heparin drip per protocol & Protonix IV q12hr. Diet:  NPO. Weaning TPN & advancing tube feedings. Code Status:  Full Code per previous physician discussions. Disposition:  Patient remains critically ill in the ICU.  Family Update:  Updated daughter at  bedside today.  DISCUSSION:  71 y.o. male with multiple medical problems. No evidence of CVA. Escalating dose of vasopressor requirement is concerning. Question possible C. Difficile colitis given chronic atelectatic therapy. Continuing on pressure support wean.  I have spent a total of 31 minutes of critical care time today caring for the patient, updating family at bedside, and reviewing the patient's electronic medical record.   Donna Christen Jamison Neighbor, M.D. [facility] Pager:  [phone] After 3pm or if no response, call [phone] 12/04/2016  10:27 AM

## 2016-12-04 NOTE — Progress Notes (Signed)
[facility] ADULT ICU REPLACEMENT PROTOCOL FOR AM LAB REPLACEMENT ONLY  The patient does apply for the [facility] Adult ICU Electrolyte Replacment Protocol based on the criteria listed below:   1. Is GFR >/= 40 ml/min? Yes.    Patient's GFR today is 53 2. Is urine output >/= 0.5 ml/kg/hr for the last 6 hours? Yes.   Patient's UOP is 1.0 ml/kg/hr 3. Is BUN < 60 mg/dL? Yes.    Patient's BUN today is 55 4. Abnormal electrolyte(s): K+2.9 5. Ordered repletion with: protocol 6. If a panic level lab has been reported, has the CCM MD in charge been notified? No..   Physician:  Anselm Pancoast, Renae Fickle Hilliard 12/04/2016 6:10 AM

## 2016-12-04 NOTE — Progress Notes (Signed)
CRITICAL VALUE ALERT  Critical Value:  Troponin 0.10  Date & Time Notied:  12/04/16 1214  Provider Notified: Veleta Miners, NP paged. Paged Jamison Neighbor, MD   Orders Received/Actions taken: No new orders at this time.  Delories Heinz, RN

## 2016-12-04 NOTE — Progress Notes (Signed)
ANTICOAGULATION CONSULT NOTE - Follow-Up Consult  Pharmacy Consult for Heparin Indication: atrial fibrillation, ischemic limb  No Known Allergies  Patient Measurements: Height: 5\' 8"  (172.7 cm) Weight: 216 lb 0.8 oz (98 kg) IBW/kg (Calculated) : 68.4 Heparin Dosing Weight: 89.6 kg  Vital Signs: Temp: 101.5 F (38.6 C) (08/17 1926) Temp Source: Oral (08/17 1926) BP: 97/53 (08/17 2000) Pulse Rate: 64 (08/17 2000)  Labs:  Recent Labs  12/02/16 0400  12/03/16 0430 12/04/16 0153 12/04/16 0448 12/04/16 1101 12/04/16 1642 12/04/16 2000  HGB 10.6*  --  11.2*  --  11.5*  --  11.0*  --   HCT 30.2*  --  31.6*  --  32.7*  --  31.8*  --   PLT 93*  --  128*  --  242  --   --   --   HEPARINUNFRC  --   --   --  0.28*  --  0.44  --  0.29*  CREATININE 2.61*  < > 1.85*  --  1.46*  --  1.41*  --   TROPONINI  --   --   --   --   --  0.10* 0.11*  --   < > = values in this interval not displayed.  Estimated Creatinine Clearance: 52.9 mL/min (A) (by C-G formula based on SCr of 1.41 mg/dL (H)).   Medical History: Past Medical History:  Diagnosis Date  . ANXIETY 02/04/2009  . CHF (congestive heart failure) ([facility])   . Chronic systolic CHF (congestive heart failure) ([facility])   . DIABETES MELLITUS, TYPE II 11/07/2008  . 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 HIP PAIN, RIGHT 11/06/2009  . HYPERLIPIDEMIA 11/07/2008  . HYPERTENSION 10/08/2008  . Nonischemic cardiomyopathy [facility])    s/p ICD implantation 03-2013 by Dr 04-2013  . OSTEOARTHRITIS, KNEE, RIGHT 10/08/2008  . Peripheral arterial disease (HCC)     Medications:  Scheduled:  . aspirin  81 mg Per Tube Daily  . chlorhexidine gluconate (MEDLINE KIT)  15 mL Mouth Rinse BID  . Chlorhexidine Gluconate Cloth  6 each Topical Daily  . clarithromycin  500 mg Per Tube Q12H  . feeding supplement (PRO-STAT SUGAR FREE 64)  60 mL Per Tube TID  . feeding supplement (VITAL HIGH PROTEIN)  1,000 mL Per Tube Q24H  . insulin aspart  0-20 Units  Subcutaneous Q4H  . mouth rinse  15 mL Mouth Rinse QID  . metroNIDAZOLE  500 mg Per Tube Q8H  . pantoprazole (PROTONIX) IV  40 mg Intravenous Q12H  . sodium chloride flush  10-40 mL Intracatheter Q12H   Infusions:  . sodium chloride 250 mL (12/04/16 2000)  . sodium chloride 500 mL (12/03/16 0126)  . heparin 1,400 Units/hr (12/04/16 2000)  . norepinephrine (LEVOPHED) Adult infusion 32 mcg/min (12/04/16 2000)  . piperacillin-tazobactam (ZOSYN)  IV 3.375 g (12/04/16 2001)  . potassium PHOSPHATE IVPB (mmol) 30 mmol (12/04/16 1756)   PRN: sodium chloride, acetaminophen (TYLENOL) oral liquid 160 mg/5 mL, artificial tears, fentaNYL (SUBLIMAZE) injection, midazolam, ondansetron **OR** ondansetron (ZOFRAN) IV, sodium chloride flush  Assessment: 71 yo male admitted 8/9 with gastric ulcer perforation and peritonitis. Underwent emergent laparotomy with omental patch repair. Post op course complicated by multiorgan failure requiring intubation and pressors. On 8/13, Pharmacy consulted to dose IV heparin for afib and presumed ischemic right upper extremity. Heparin held 8/13 secondary to bleeding at incision site, resumed on 8/16 after repeat CT negative for acute infarct and pt had cool extremity.   Heparin level 0.29 slightly  less than goal on drip rate 1400 uts/hr  CBC stable, no S/Sx bleeding noted per RN.  Goal of Therapy:  Heparin level 0.3-0.5 - will use lower end of therapeutic goal Monitor platelets by anticoagulation protocol: Yes   Plan:  Increase  heparin at 1500 units/hr -Monitor heparin level, CBC, S/Sx bleeding daily  Bonnita Nasuti Pharm.D. CPP, BCPS Clinical Pharmacist [phone] 12/04/2016 10:27 PM

## 2016-12-04 NOTE — Consult Note (Addendum)
Surgical team and vascular team are following patient for other wounds. WOC consult requested to assess multiple areas of partial thickness skin loss where previous blisters have ruptured.  Pt appears to have fluid overload and there are multiple areas of moist pink wounds with loose peeling skin, most locations are able to be covered by a 2X4cm foam dressing to protect and promote healing. Anterior scrotum is red and macerated with partial thickness skin loss. No wounds noted to heels or toes and BLE are elevated in Prevalon boots to reduce pressure. Continue present plan of care with foam dressings and barrier cream to scrotum to promote healing. Pt is on a low-airloss bed to reduce pressure. No family members present to discuss plan of care and pt is sedated. Please re-consult if further assistance is needed.  Thank-you,  Cammie Mcgee MSN, RN, CWOCN, [place], CNS [phone]

## 2016-12-04 NOTE — Progress Notes (Signed)
CRITICAL VALUE ALERT  Critical Value:  Troponin 0.11  Date & Time Notied:  1728  Provider Notified: Mannam, MD  Orders Received/Actions taken: Will continue to monitor.   Delories Heinz, RN

## 2016-12-04 NOTE — Progress Notes (Signed)
ANTICOAGULATION CONSULT NOTE - Follow-Up Consult  Pharmacy Consult for Heparin Indication: atrial fibrillation, ischemic limb  No Known Allergies  Patient Measurements: Height: _0  (172.7 cm) Weight: 221 lb 12.5 oz (100.6 kg) IBW/kg (Calculated) : 68.4 Heparin Dosing Weight: 89.6 kg  Vital Signs: Temp: 99.3 F (37.4 C) (08/16 2350) Temp Source: Oral (08/16 2350) BP: 109/61 (08/17 0230) Pulse Rate: 94 (08/17 0230)  Labs:  Recent Labs  12/01/16 0304 12/02/16 0400 12/02/16 1542 12/03/16 0430 12/04/16 0153  HGB 12.2* 10.6*  --  11.2*  --   HCT 35.3* 30.2*  --  31.6*  --   PLT 102* 93*  --  128*  --   HEPARINUNFRC  --   --   --   --  0.28*  CREATININE 3.02* 2.61* 2.17* 1.85*  --     Estimated Creatinine Clearance: 40.9 mL/min (A) (by C-G formula based on SCr of 1.85 mg/dL (H)).   Medical History: Past Medical History:  Diagnosis Date  . ANXIETY 02/04/2009  . CHF (congestive heart failure) ([place])   . Chronic systolic CHF (congestive heart failure) ([place])   . DIABETES MELLITUS, TYPE II 11/07/2008  . 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 HIP PAIN, RIGHT 11/06/2009  . HYPERLIPIDEMIA 11/07/2008  . HYPERTENSION 10/08/2008  . Nonischemic cardiomyopathy [facility])    s/p ICD implantation 03-2013 by Dr Lovena Le  . OSTEOARTHRITIS, KNEE, RIGHT 10/08/2008  . Peripheral arterial disease (HCC)     Medications:  Scheduled:  . aspirin  81 mg Per Tube Daily  . chlorhexidine gluconate (MEDLINE KIT)  15 mL Mouth Rinse BID  . Chlorhexidine Gluconate Cloth  6 each Topical Daily  . clarithromycin  500 mg Per Tube Q12H  . feeding supplement (PRO-STAT SUGAR FREE 64)  60 mL Per Tube TID  . feeding supplement (VITAL HIGH PROTEIN)  1,000 mL Per Tube Q24H  . insulin aspart  0-20 Units Subcutaneous Q4H  . mouth rinse  15 mL Mouth Rinse QID  . metroNIDAZOLE  500 mg Per Tube TID  . pantoprazole (PROTONIX) IV  40 mg Intravenous Q12H  . sodium chloride flush  10-40 mL Intracatheter Q12H    Infusions:  . sodium chloride 250 mL (12/04/16 0100)  . sodium chloride 500 mL (12/03/16 0126)  . heparin 1,300 Units/hr (12/04/16 0100)  . norepinephrine (LEVOPHED) Adult infusion 29.973 mcg/min (12/04/16 0100)  . piperacillin-tazobactam (ZOSYN)  IV Stopped (12/04/16 0148)  . thiamine injection Stopped (12/03/16 2057)   PRN: sodium chloride, acetaminophen, artificial tears, fentaNYL (SUBLIMAZE) injection, midazolam, ondansetron **OR** ondansetron (ZOFRAN) IV, sodium chloride flush  Assessment: 71 yo male admitted 8/9 with gastric ulcer perforation and peritonitis. Underwent emergent laparotomy with omental patch repair. Post op course complicated by multiorgan failure requiring intubation and pressors. On 8/13, Pharmacy consulted to dose IV heparin for afib and presumed ischemic right upper extremity.   Pt now s/p brachial embolectomy 8/13 and found to have bleeding from incision site and IV site - heparin held overnight per VVS.  8/14 morning CBC stable but bleeding continues, will hold heparin another 24 hours per VVS and reassess in morning.  8/15 heparin remains on hold per neuro for possible CVA  8/16 head CT ok no bleeding no acute stroke noted - CCM and neuro discussed and decision to restart heparin.  8/17 AM: heparin level sub-therapeutic after re-start  Goal of Therapy:  Heparin level 0.3-0.5 - will use lower end of therapeutic goal Monitor platelets by anticoagulation protocol: Yes   Plan:  -Inc  heparin to 1400 units/hr -1100 HL  Narda Bonds, PharmD, BCPS Clinical Pharmacist Phone: [phone]

## 2016-12-04 NOTE — Progress Notes (Signed)
4 Days Post-Op   Subjective/Chief Complaint: Patient still intubated, on Levophed Eyes open - responsive - indicates no abdominal pain At goal rate on tube feeds - 30 ml/hr Liquid bowel movements Off TNA   Objective: Vital signs in last 24 hours: Temp:  [98.1 F (36.7 C)-100 F (37.8 C)] 100 F (37.8 C) (08/17 0818) Pulse Rate:  [34-117] 106 (08/17 0818) Resp:  [15-34] 26 (08/17 0818) BP: (63-127)/(26-79) 101/69 (08/17 0818) SpO2:  [90 %-100 %] 100 % (08/17 0818) FiO2 (%):  [30 %] 30 % (08/17 0818) Weight:  [98 kg (216 lb 0.8 oz)] 98 kg (216 lb 0.8 oz) (08/17 0500) Last BM Date: 12/03/16  Intake/Output from previous day: 08/16 0701 - 08/17 0700 In: 3635.8 [I.V.:1739.8; NG/GT:810; IV Piggyback:856] Out: 4584 [Urine:3800; Drains:34; Stool:750] Intake/Output this shift: Total I/O In: 182.1 [I.V.:52.1; Other:100; NG/GT:30] Out: 325 [Urine:325]  General appearance: alert on vent; no distress GI: obese, distended; + BS Wound - clean; minimal drainage; minimal granulation tissue  Lab Results:   Recent Labs  12/03/16 0430 12/04/16 0448  WBC 19.2* 22.0*  HGB 11.2* 11.5*  HCT 31.6* 32.7*  PLT 128* 242   BMET  Recent Labs  12/03/16 0430 12/04/16 0448  NA 133* 135  K 2.9* 2.9*  CL 100* 101  CO2 22 24  GLUCOSE 244* 109*  BUN 71* 55*  CREATININE 1.85* 1.46*  CALCIUM 6.9* 7.4*   PT/INR No results for input(s): LABPROT, INR in the last 72 hours. ABG No results for input(s): PHART, HCO3 in the last 72 hours.  Invalid input(s): PCO2, PO2  Studies/Results: Ct Head Wo Contrast  Result Date: 12/03/2016 CLINICAL DATA:  Stroke follow-up EXAM: CT HEAD WITHOUT CONTRAST TECHNIQUE: Contiguous axial images were obtained from the base of the skull through the vertex without intravenous contrast. COMPARISON:  12/01/2016 FINDINGS: Brain: No acute infarct is identified. Symmetric normal density of the bilateral deep cerebral white matter which was highlighted previously.  Small remote bilateral peripheral cerebellar infarcts. There is chronic microvascular ischemic gliosis with confluent low-density greatest in the frontal periventricular white matter. Generalized atrophy. No hemorrhage, hydrocephalus, or masslike findings. Vascular: Atherosclerotic calcification. Skull: No acute or aggressive finding. Sinuses/Orbits: Essentially clear sinuses and mastoids. Nasal and oral intubation. IMPRESSION: 1. No acute finding. No acute infarct detected in the cerebellum as questioned previously. 2. Small remote bilateral cerebellar infarcts. Chronic small vessel ischemia in the cerebral white matter. Electronically Signed   By: Marnee Spring M.D.   On: 12/03/2016 13:15    Anti-infectives: Anti-infectives    Start     Dose/Rate Route Frequency Ordered Stop   12/02/16 1100  clarithromycin (BIAXIN) 250 MG/5ML suspension 500 mg     500 mg Per Tube Every 12 hours 12/02/16 1012     12/02/16 1045  metroNIDAZOLE (FLAGYL) 50 mg/ml oral suspension 500 mg     500 mg Per Tube 3 times daily 12/02/16 1036     12/02/16 1015  metroNIDAZOLE (FLAGYL) 50 mg/ml oral suspension 250 mg  Status:  Discontinued     250 mg Per Tube 4 times daily 12/02/16 1011 12/02/16 1036   11/28/16 0800  fluconazole (DIFLUCAN) IVPB 200 mg  Status:  Discontinued     200 mg 100 mL/hr over 60 Minutes Intravenous Every 24 hours 11/27/16 0624 12/02/16 1018   11/27/16 0630  fluconazole (DIFLUCAN) IVPB 400 mg     400 mg 100 mL/hr over 120 Minutes Intravenous  Once 11/27/16 0622 11/27/16 0841   11/27/16 0600  piperacillin-tazobactam (  ZOSYN) IVPB 3.375 g     3.375 g 12.5 mL/hr over 240 Minutes Intravenous Every 8 hours 11/27/16 0534     11/27/16 0515  piperacillin-tazobactam (ZOSYN) IVPB 3.375 g  Status:  Discontinued     3.375 g 100 mL/hr over 30 Minutes Intravenous  Once 11/27/16 0510 11/27/16 0518   11/27/16 0030  piperacillin-tazobactam (ZOSYN) IVPB 3.375 g     3.375 g 100 mL/hr over 30 Minutes Intravenous  Once  11/27/16 0016 11/27/16 0755   11/27/16 0030  vancomycin (VANCOCIN) IVPB 1000 mg/200 mL premix     1,000 mg 200 mL/hr over 60 Minutes Intravenous  Once 11/27/16 0016 11/27/16 0756      Assessment/Plan: Acute ischemia right hand s/p R brachial ulnar and radial artery ebolectomy Septic shock 2/2 perf gastric ulcer - cont abx, on Levo Acute hypoxic respiratory failure - vent mgmt per CCM Hx PAD, sCHD, NICM s/p ICD placement 2014 HTN HLD AKI DM Acute encephalopathy - improved today --off sedation  Perforated pyloric ulcer  POD#7S/P EXPLORATORY LAPAROTOMY, GRAM PATCH REPAIR8/10/18 Dr. Ovidio Kin - bowel function returning - 2 BMs, decreasing NGT output. Drain output clear - tube feeds at goal rate - continue BID wet to dry dressing changes  - continue IV Protonix  Malnutrition:prealbumin <5 FEN: NPO, IVF, TNA, NGT - start TF; hypokalemia (2.3), Mg 2.4,hyponatremia (131), ID: WBC increasing- doubt intra-abdominal infection as patient has multiple other acute events that have occurred and he has regained bowel function.  However, if other sources ruled out, may consider CT Abd/ Pelvis VTE: SCD's, heparin   Dispo: Leave drain in place Continue tube feeds Continue current wound care  LOS: 7 days    Jacob Pittman K. 12/04/2016

## 2016-12-04 NOTE — Progress Notes (Signed)
eLink Physician-Brief Progress Note Patient Name: Jacob Pittman DOB: 10/28/1945 MRN: [id number]   Date of Service  12/04/2016  HPI/Events of Note  K 3.3, Phos 1.8 Trop 0.11  eICU Interventions  Replete lytes Follow troponin     Intervention Category Major Interventions: Electrolyte abnormality - evaluation and management  Dohn Stclair 12/04/2016, 5:25 PM

## 2016-12-04 NOTE — Progress Notes (Addendum)
ANTICOAGULATION CONSULT NOTE - Follow-Up Consult  Pharmacy Consult for Heparin Indication: atrial fibrillation, ischemic limb  No Known Allergies  Patient Measurements: Height: '5\' 8"'$  (172.7 cm) Weight: 216 lb 0.8 oz (98 kg) IBW/kg (Calculated) : 68.4 Heparin Dosing Weight: 89.6 kg  Vital Signs: Temp: 99.4 F (37.4 C) (08/17 1147) Temp Source: Oral (08/17 1147) BP: 96/69 (08/17 1133) Pulse Rate: 62 (08/17 1147)  Labs:  Recent Labs  12/02/16 0400 12/02/16 1542 12/03/16 0430 12/04/16 0153 12/04/16 0448 12/04/16 1101  HGB 10.6*  --  11.2*  --  11.5*  --   HCT 30.2*  --  31.6*  --  32.7*  --   PLT 93*  --  128*  --  242  --   HEPARINUNFRC  --   --   --  0.28*  --  0.44  CREATININE 2.61* 2.17* 1.85*  --  1.46*  --     Estimated Creatinine Clearance: 51.1 mL/min (A) (by C-G formula based on SCr of 1.46 mg/dL (H)).   Medical History: Past Medical History:  Diagnosis Date  . ANXIETY 02/04/2009  . CHF (congestive heart failure) ([place])   . Chronic systolic CHF (congestive heart failure) ([place])   . DIABETES MELLITUS, TYPE II 11/07/2008  . 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 HIP PAIN, RIGHT 11/06/2009  . HYPERLIPIDEMIA 11/07/2008  . HYPERTENSION 10/08/2008  . Nonischemic cardiomyopathy [facility])    s/p ICD implantation 03-2013 by Dr Lovena Le  . OSTEOARTHRITIS, KNEE, RIGHT 10/08/2008  . Peripheral arterial disease (HCC)     Medications:  Scheduled:  . aspirin  81 mg Per Tube Daily  . chlorhexidine gluconate (MEDLINE KIT)  15 mL Mouth Rinse BID  . Chlorhexidine Gluconate Cloth  6 each Topical Daily  . clarithromycin  500 mg Per Tube Q12H  . feeding supplement (PRO-STAT SUGAR FREE 64)  60 mL Per Tube TID  . feeding supplement (VITAL HIGH PROTEIN)  1,000 mL Per Tube Q24H  . insulin aspart  0-20 Units Subcutaneous Q4H  . mouth rinse  15 mL Mouth Rinse QID  . metroNIDAZOLE  500 mg Per Tube Q8H  . pantoprazole (PROTONIX) IV  40 mg Intravenous Q12H  . sodium chloride flush   10-40 mL Intracatheter Q12H   Infusions:  . sodium chloride 250 mL (12/04/16 0700)  . sodium chloride 500 mL (12/03/16 0126)  . heparin 1,400 Units/hr (12/04/16 0700)  . norepinephrine (LEVOPHED) Adult infusion 29.973 mcg/min (12/04/16 0700)  . piperacillin-tazobactam (ZOSYN)  IV Stopped (12/04/16 0932)   PRN: sodium chloride, acetaminophen, artificial tears, fentaNYL (SUBLIMAZE) injection, midazolam, ondansetron **OR** ondansetron (ZOFRAN) IV, sodium chloride flush  Assessment: 71 yo male admitted 8/9 with gastric ulcer perforation and peritonitis. Underwent emergent laparotomy with omental patch repair. Post op course complicated by multiorgan failure requiring intubation and pressors. On 8/13, Pharmacy consulted to dose IV heparin for afib and presumed ischemic right upper extremity. Heparin held 8/13 secondary to bleeding at incision site, resumed on 8/16 after repeat CT negative for acute infarct and pt had cool extremity.   Heparin currently therapeutic at 0.44, CBC stable, no S/Sx bleeding noted per RN.  Goal of Therapy:  Heparin level 0.3-0.5 - will use lower end of therapeutic goal Monitor platelets by anticoagulation protocol: Yes   Plan:  -Continue heparin at 1400 units/hr -Check 8-hr confirmatory heparin level -Monitor heparin level, CBC, S/Sx bleeding daily  Arrie Senate, PharmD PGY-2 Cardiology Pharmacy Resident Pager: [phone] 12/04/2016

## 2016-12-05 DIAGNOSIS — K668 Other specified disorders of peritoneum: Secondary | ICD-10-CM

## 2016-12-05 LAB — CBC WITH DIFFERENTIAL/PLATELET
Basophils Absolute: 0 10*3/uL (ref 0.0–0.1)
Basophils Relative: 0 %
EOS PCT: 0 %
Eosinophils Absolute: 0 10*3/uL (ref 0.0–0.7)
HEMATOCRIT: 31.1 % — AB (ref 39.0–52.0)
Hemoglobin: 10.6 g/dL — ABNORMAL LOW (ref 13.0–17.0)
Lymphocytes Relative: 6 %
Lymphs Abs: 1.4 10*3/uL (ref 0.7–4.0)
MCH: 33.3 pg (ref 26.0–34.0)
MCHC: 34.1 g/dL (ref 30.0–36.0)
MCV: 97.8 fL (ref 78.0–100.0)
Monocytes Absolute: 0.9 10*3/uL (ref 0.1–1.0)
Monocytes Relative: 4 %
NEUTROS PCT: 90 %
Neutro Abs: 20.3 10*3/uL — ABNORMAL HIGH (ref 1.7–7.7)
Platelets: 320 10*3/uL (ref 150–400)
RBC: 3.18 MIL/uL — AB (ref 4.22–5.81)
RDW: 14.3 % (ref 11.5–15.5)
WBC: 22.6 10*3/uL — AB (ref 4.0–10.5)

## 2016-12-05 LAB — GLUCOSE, CAPILLARY
GLUCOSE-CAPILLARY: 151 mg/dL — AB (ref 65–99)
GLUCOSE-CAPILLARY: 175 mg/dL — AB (ref 65–99)
GLUCOSE-CAPILLARY: 211 mg/dL — AB (ref 65–99)
Glucose-Capillary: 219 mg/dL — ABNORMAL HIGH (ref 65–99)
Glucose-Capillary: 177 mg/dL — ABNORMAL HIGH (ref 65–99)

## 2016-12-05 LAB — BASIC METABOLIC PANEL
ANION GAP: 11 (ref 5–15)
Anion gap: 9 (ref 5–15)
BUN: 44 mg/dL — ABNORMAL HIGH (ref 6–20)
BUN: 37 mg/dL — AB (ref 6–20)
CALCIUM: 7.5 mg/dL — AB (ref 8.9–10.3)
CO2: 21 mmol/L — ABNORMAL LOW (ref 22–32)
CO2: 21 mmol/L — ABNORMAL LOW (ref 22–32)
CREATININE: 1.3 mg/dL — AB (ref 0.61–1.24)
Calcium: 7.5 mg/dL — ABNORMAL LOW (ref 8.9–10.3)
Chloride: 106 mmol/L (ref 101–111)
Chloride: 109 mmol/L (ref 101–111)
Creatinine, Ser: 1.31 mg/dL — ABNORMAL HIGH (ref 0.61–1.24)
GFR calc Af Amer: >60 mL/min (ref >60)
GFR, EST NON AFRICAN AMERICAN: 52 mL/min — AB (ref >60)
GFR, EST NON AFRICAN AMERICAN: 53 mL/min — AB (ref >60)
GLUCOSE: 113 mg/dL — AB (ref 65–99)
GLUCOSE: 205 mg/dL — AB (ref 65–99)
POTASSIUM: 2.9 mmol/L — AB (ref 3.5–5.1)
POTASSIUM: 3.6 mmol/L (ref 3.5–5.1)
Sodium: 138 mmol/L (ref 135–145)
Sodium: 139 mmol/L (ref 135–145)

## 2016-12-05 LAB — TROPONIN I
TROPONIN I: 0.08 ng/mL — AB (ref <0.03)
Troponin I: 0.11 ng/mL (ref <0.03)
Troponin I: 0.1 ng/mL (ref <0.03)

## 2016-12-05 LAB — MAGNESIUM: Magnesium: 2 mg/dL (ref 1.7–2.4)

## 2016-12-05 LAB — HEPARIN LEVEL (UNFRACTIONATED): HEPARIN UNFRACTIONATED: 0.43 [IU]/mL (ref 0.30–0.70)

## 2016-12-05 LAB — PHOSPHORUS: Phosphorus: 2.7 mg/dL (ref 2.5–4.6)

## 2016-12-05 MED ORDER — IPRATROPIUM-ALBUTEROL 0.5-2.5 (3) MG/3ML IN SOLN
3.0000 mL | Freq: Four times a day (QID) | RESPIRATORY_TRACT | Status: DC
  Administered 2016-12-05 – 2016-12-13 (×32): 3 mL via RESPIRATORY_TRACT
  Filled 2016-12-05 (×32): qty 3

## 2016-12-05 MED ORDER — POTASSIUM CHLORIDE 10 MEQ/50ML IV SOLN
10.0000 meq | INTRAVENOUS | Status: AC
  Administered 2016-12-05 (×4): 10 meq via INTRAVENOUS
  Filled 2016-12-05 (×3): qty 50

## 2016-12-05 MED ORDER — POTASSIUM CHLORIDE 20 MEQ/15ML (10%) PO SOLN
40.0000 meq | Freq: Once | ORAL | Status: AC
  Administered 2016-12-05: 40 meq
  Filled 2016-12-05: qty 30

## 2016-12-05 MED ORDER — POTASSIUM CHLORIDE 20 MEQ/15ML (10%) PO SOLN
40.0000 meq | ORAL | Status: AC
  Administered 2016-12-05 (×2): 40 meq
  Filled 2016-12-05 (×4): qty 30

## 2016-12-05 NOTE — Progress Notes (Signed)
[facility] ADULT ICU REPLACEMENT PROTOCOL FOR AM LAB REPLACEMENT ONLY  The patient does apply for the [facility] Adult ICU Electrolyte Replacment Protocol based on the criteria listed below:   1. Is GFR >/= 40 ml/min? Yes.    Patient's GFR today is >60 2. Is urine output >/= 0.5 ml/kg/hr for the last 6 hours? Yes.   Patient's UOP is 0.8 ml/kg/hr 3. Is BUN < 60 mg/dL? Yes.    Patient's BUN today is 44 4. Abnormal electrolyte(s): K+2.9 5. Ordered repletion with: protocol 6. If a panic level lab has been reported, has the CCM MD in charge been notified? Yes.  .   Physician:  Brigid Re 12/05/2016 5:31 AM

## 2016-12-05 NOTE — Progress Notes (Signed)
ANTICOAGULATION CONSULT NOTE - Follow-Up Consult  Pharmacy Consult for Heparin Indication: atrial fibrillation, ischemic limb  No Known Allergies  Patient Measurements: Height: 5\' 8"  (172.7 cm) Weight: 214 lb 4.6 oz (97.2 kg) IBW/kg (Calculated) : 68.4 Heparin Dosing Weight: 89.6 kg  Vital Signs: Temp: 98.5 F (36.9 C) (08/18 0745) Temp Source: Oral (08/18 0745) BP: 104/72 (08/18 1030) Pulse Rate: 93 (08/18 1030)  Labs:  Recent Labs  12/03/16 0430  12/04/16 0448  12/04/16 1101 12/04/16 1642 12/04/16 2000 12/04/16 2251 12/05/16 0446 12/05/16 0447 12/05/16 1024  HGB 11.2*  --  11.5*  --   --  11.0*  --   --  10.6*  --   --   HCT 31.6*  --  32.7*  --   --  31.8*  --   --  31.1*  --   --   PLT 128*  --  242  --   --   --   --   --  320  --   --   HEPARINUNFRC  --   < >  --   --  0.44  --  0.29*  --   --  0.43  --   CREATININE 1.85*  --  1.46*  --   --  1.41*  --   --  1.31*  --   --   TROPONINI  --   --   --   < > 0.10* 0.11*  --  0.13*  --   --  0.08*  < > = values in this interval not displayed.  Estimated Creatinine Clearance: 56.8 mL/min (A) (by C-G formula based on SCr of 1.31 mg/dL (H)).   Medical History: Past Medical History:  Diagnosis Date  . ANXIETY 02/04/2009  . CHF (congestive heart failure) (HCC)   . Chronic systolic CHF (congestive heart failure) (HCC)   . DIABETES MELLITUS, TYPE II 11/07/2008  . 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 HIP PAIN, RIGHT 11/06/2009  . HYPERLIPIDEMIA 11/07/2008  . HYPERTENSION 10/08/2008  . Nonischemic cardiomyopathy [facility])    s/p ICD implantation 03-2013 by Dr 04-2013  . OSTEOARTHRITIS, KNEE, RIGHT 10/08/2008  . Peripheral arterial disease (HCC)     Medications:  Scheduled:  . aspirin  81 mg Per Tube Daily  . chlorhexidine gluconate (MEDLINE KIT)  15 mL Mouth Rinse BID  . Chlorhexidine Gluconate Cloth  6 each Topical Daily  . clarithromycin  500 mg Per Tube Q12H  . feeding supplement (PRO-STAT SUGAR FREE 64)  60  mL Per Tube TID  . feeding supplement (VITAL HIGH PROTEIN)  1,000 mL Per Tube Q24H  . insulin aspart  0-20 Units Subcutaneous Q4H  . ipratropium-albuterol  3 mL Nebulization Q6H  . mouth rinse  15 mL Mouth Rinse QID  . metroNIDAZOLE  500 mg Per Tube Q8H  . pantoprazole (PROTONIX) IV  40 mg Intravenous Q12H  . sodium chloride flush  10-40 mL Intracatheter Q12H   Infusions:  . sodium chloride 250 mL (12/05/16 1000)  . sodium chloride 500 mL (12/03/16 0126)  . heparin 1,500 Units/hr (12/05/16 1000)  . norepinephrine (LEVOPHED) Adult infusion 31 mcg/min (12/05/16 1032)  . piperacillin-tazobactam (ZOSYN)  IV 3.375 g (12/05/16 1129)   PRN: sodium chloride, acetaminophen (TYLENOL) oral liquid 160 mg/5 mL, artificial tears, fentaNYL (SUBLIMAZE) injection, midazolam, ondansetron **OR** ondansetron (ZOFRAN) IV, sodium chloride flush  Assessment: 71 yo male admitted 8/9 with gastric ulcer perforation and peritonitis. Underwent emergent laparotomy with omental patch repair. Post op course complicated  by multiorgan failure requiring intubation and pressors. On 8/13, Pharmacy consulted to dose IV heparin for afib and presumed ischemic right upper extremity. Heparin held 8/13 secondary to bleeding at incision site, resumed on 8/16 after repeat CT negative for acute infarct and pt had cool extremity.   Heparin currently therapeutic at 0.43, CBC stable, no S/Sx bleeding noted per RN.  Goal of Therapy:  Heparin level 0.3-0.5 - will use lower end of therapeutic goal Monitor platelets by anticoagulation protocol: Yes   Plan:  -Continue heparin at 1500 units/hr -Monitor heparin level, CBC, S/Sx bleeding daily  Uvaldo Rising, BCPS  Clinical Pharmacist Pager [phone]  12/05/2016 11:34 AM

## 2016-12-05 NOTE — Progress Notes (Signed)
[facility] Progress Note  5 Days Post-Op  Subjective: CC:    Awake, alert, FC on vent. Denies pain.   on Levo Now with elevated troponin  Objective: Vital signs in last 24 hours: Temp:  [98.2 F (36.8 C)-101.5 F (38.6 C)] 98.3 F (36.8 C) (08/18 0400) Pulse Rate:  [30-119] 70 (08/18 0600) Resp:  [0-38] 16 (08/18 0600) BP: (80-130)/(32-101) 123/73 (08/18 0600) SpO2:  [10 %-100 %] 100 % (08/18 0600) FiO2 (%):  [30 %-40 %] 30 % (08/18 0453) Weight:  [97.2 kg (214 lb 4.6 oz)] 97.2 kg (214 lb 4.6 oz) (08/18 0430) Last BM Date: 12/04/16  Intake/Output from previous day: 08/17 0701 - 08/18 0700 In: 3060.4 [I.V.:1305.4; NG/GT:870; IV Piggyback:610] Out: 3900 [Urine:2550; Drains:49; Stool:1301] Intake/Output this shift: No intake/output data recorded.  PE: General appearance: alert on vent; no distress GI: obese, distended; + BS   Wound - clean; minimal granulation tissue but no necrosis or purulent drainage      Drain - 49cc/24h SS    Stool - 1,301 cc/ 24h. Soft, brown. Rectal tube.    Lab Results:   Recent Labs  12/04/16 0448 12/04/16 1642 12/05/16 0446  WBC 22.0*  --  22.6*  HGB 11.5* 11.0* 10.6*  HCT 32.7* 31.8* 31.1*  PLT 242  --  320   BMET  Recent Labs  12/04/16 1642 12/05/16 0446  NA 136 138  K 3.3* 2.9*  CL 105 106  CO2 22 21*  GLUCOSE 169* 113*  BUN 47* 44*  CREATININE 1.41* 1.31*  CALCIUM 7.4* 7.5*   PT/INR No results for input(s): LABPROT, INR in the last 72 hours. CMP     Component Value Date/Time   NA 138 12/05/2016 0446   K 2.9 (L) 12/05/2016 0446   CL 106 12/05/2016 0446   CO2 21 (L) 12/05/2016 0446   GLUCOSE 113 (H) 12/05/2016 0446   BUN 44 (H) 12/05/2016 0446   CREATININE 1.31 (H) 12/05/2016 0446   CREATININE 0.91 03/04/2015 1618   CALCIUM 7.5 (L) 12/05/2016 0446   PROT 5.4 (L) 12/04/2016 0448   ALBUMIN 1.9 (L) 12/04/2016 0448   AST 92 (H) 12/04/2016 0448   ALT 105 (H) 12/04/2016 0448   ALKPHOS 94  12/04/2016 0448   BILITOT 2.4 (H) 12/04/2016 0448   GFRNONAA 52 (L) 12/05/2016 0446   GFRAA >60 12/05/2016 0446   Lipase     Component Value Date/Time   LIPASE 20 12/01/2016 0304   Studies/Results: Ct Head Wo Contrast  Result Date: 12/03/2016 CLINICAL DATA:  Stroke follow-up EXAM: CT HEAD WITHOUT CONTRAST TECHNIQUE: Contiguous axial images were obtained from the base of the skull through the vertex without intravenous contrast. COMPARISON:  12/01/2016 FINDINGS: Brain: No acute infarct is identified. Symmetric normal density of the bilateral deep cerebral white matter which was highlighted previously. Small remote bilateral peripheral cerebellar infarcts. There is chronic microvascular ischemic gliosis with confluent low-density greatest in the frontal periventricular white matter. Generalized atrophy. No hemorrhage, hydrocephalus, or masslike findings. Vascular: Atherosclerotic calcification. Skull: No acute or aggressive finding. Sinuses/Orbits: Essentially clear sinuses and mastoids. Nasal and oral intubation. IMPRESSION: 1. No acute finding. No acute infarct detected in the cerebellum as questioned previously. 2. Small remote bilateral cerebellar infarcts. Chronic small vessel ischemia in the cerebral white matter. Electronically Signed   By: Marnee Spring M.D.   On: 12/03/2016 13:15    Anti-infectives: Anti-infectives    Start     Dose/Rate Route Frequency Ordered Stop   12/04/16  2200  clarithromycin (BIAXIN) tablet 500 mg     500 mg Per Tube Every 12 hours 12/04/16 1107     12/04/16 2000  piperacillin-tazobactam (ZOSYN) IVPB 3.375 g     3.375 g 12.5 mL/hr over 240 Minutes Intravenous Every 8 hours 12/04/16 1339     12/04/16 1600  metroNIDAZOLE (FLAGYL) tablet 500 mg     500 mg Per Tube Every 8 hours 12/04/16 1107     12/02/16 1100  clarithromycin (BIAXIN) 250 MG/5ML suspension 500 mg  Status:  Discontinued     500 mg Per Tube Every 12 hours 12/02/16 1012 12/04/16 1107   12/02/16  1045  metroNIDAZOLE (FLAGYL) 50 mg/ml oral suspension 500 mg  Status:  Discontinued     500 mg Per Tube 3 times daily 12/02/16 1036 12/04/16 1107   12/02/16 1015  metroNIDAZOLE (FLAGYL) 50 mg/ml oral suspension 250 mg  Status:  Discontinued     250 mg Per Tube 4 times daily 12/02/16 1011 12/02/16 1036   11/28/16 0800  fluconazole (DIFLUCAN) IVPB 200 mg  Status:  Discontinued     200 mg 100 mL/hr over 60 Minutes Intravenous Every 24 hours 11/27/16 0624 12/02/16 1018   11/27/16 0630  fluconazole (DIFLUCAN) IVPB 400 mg     400 mg 100 mL/hr over 120 Minutes Intravenous  Once 11/27/16 0622 11/27/16 0841   11/27/16 0600  piperacillin-tazobactam (ZOSYN) IVPB 3.375 g  Status:  Discontinued     3.375 g 12.5 mL/hr over 240 Minutes Intravenous Every 8 hours 11/27/16 0534 12/04/16 1339   11/27/16 0515  piperacillin-tazobactam (ZOSYN) IVPB 3.375 g  Status:  Discontinued     3.375 g 100 mL/hr over 30 Minutes Intravenous  Once 11/27/16 0510 11/27/16 0518   11/27/16 0030  piperacillin-tazobactam (ZOSYN) IVPB 3.375 g     3.375 g 100 mL/hr over 30 Minutes Intravenous  Once 11/27/16 0016 11/27/16 0755   11/27/16 0030  vancomycin (VANCOCIN) IVPB 1000 mg/200 mL premix     1,000 mg 200 mL/hr over 60 Minutes Intravenous  Once 11/27/16 0016 11/27/16 0756     Assessment/Plan Septic shock 2/2 perforated pyloric ulcer- cont abx, on Levo Acute ischemia right hand s/p R brachial ulnar and radial artery ebolectomy Acute hypoxic respiratory failure - vent mgmt per CCM Hx PAD, sCHD, NICM s/p ICD placement 2014 HTN HLD AKI DM Acute encephalopathy - improved today --off sedation Possible leg ischemia - heparin gtt Elevated troponin - 0.10  > 0.11 > 0.13  Perforated pyloric ulcer  POD#8S/P EXPLORATORY LAPAROTOMY, GRAM PATCH REPAIR8/10/18 Dr. Ovidio Kin - having multiple bowel movements - tube feeds at goal rate - continue BID wet to dry dressing changes  - H. pylori IgG elevated. continue IV Protonix     Malnutrition:prealbumin <5 FEN: NPO, IVF, weaning TNA, TF @ goal rate, hypokalemia (2.9) ID: Zosyn 8/10 >>, Flagyl 8/15 >>, Biaxin 8/15 >>  WBC 22.6 from 22.0- resp/urine/blood CX pending,  - doubt intra-abdominal infection as patient has multiple other acute events that have occurred and he has regained bowel function. However, if other sources ruled out, may consider CT Abd/ Pelvis   VTE: SCD's, heparin gtt  Dispo: Leave drain in place Continue tube feeds Continue current wound care    LOS: 8 days    Adam Phenix , [facility] 12/05/2016, 7:32 AM Pager: [phone] Consults: [phone] Mon-Fri 7:00 am-4:30 pm Sat-Sun 7:00 am-11:30 am

## 2016-12-05 NOTE — Progress Notes (Signed)
eLink Physician-Brief Progress Note Patient Name: Jacob Pittman DOB: Feb 27, 1946 MRN: [id number]   Date of Service  12/05/2016  HPI/Events of Note  Low potassium replaced  eICU Interventions       Intervention Category Minor Interventions: Electrolytes abnormality - evaluation and management  Henry Russel, P 12/05/2016, 6:43 PM

## 2016-12-05 NOTE — Progress Notes (Signed)
PULMONARY / CRITICAL CARE MEDICINE   Name: Jacob Pittman MRN: [id number] DOB: 1945/12/23    ADMISSION DATE:  11/26/2016   CONSULTATION DATE:  11/27/2016  REFERRING MD:  Ezzard Standing  CHIEF COMPLAINT:  Abdominal Pain  HISTORY OF PRESENT ILLNESS:  71 y.o. man with ischemic cardiomyopathy, diabetic hypertensive in poor health, admitted 8/9 with gastric ulcer perforation and peritonitis. Underwent emergent laparotomy with omental patch repair He was in [place] shock requiring epinephrine and Levophed drip. Course c/b acute ischemia of R arm requiring tx from [facility] to [facility] and urgent OR embolectomy, also c/b slow vent wean.   SUBJECTIVE:   On wean 10/5 Continues on levophed Has diarrhea, C diff is negative.  REVIEW OF SYSTEMS:  Unable to obtain with patient intubated.   VITAL SIGNS: BP 123/73   Pulse 70   Temp 98.5 F (36.9 C) (Oral)   Resp 16   Ht 5\' 8"  (1.727 m)   Wt 97.2 kg (214 lb 4.6 oz)   SpO2 100%   BMI 32.58 kg/m   HEMODYNAMICS: CVP:  [10 mmHg] 10 mmHg  VENTILATOR SETTINGS: Vent Mode: CPAP;PSV FiO2 (%):  [30 %-40 %] 30 % Set Rate:  [25 bmp] 25 bmp Vt Set:  [550 mL] 550 mL PEEP:  [5 cmH20] 5 cmH20 Pressure Support:  [10 cmH20] 10 cmH20 Plateau Pressure:  [21 cmH20-25 cmH20] 21 cmH20  INTAKE / OUTPUT:  Intake/Output Summary (Last 24 hours) at 12/05/16 4098 Last data filed at 12/05/16 0700  Gross per 24 hour  Intake          2836.22 ml  Output             3565 ml  Net          -728.78 ml     PHYSICAL EXAMINATION:. Gen:      No acute distress HEENT:  EOMI, sclera anicteric, ETT in place Neck:     No masses; no thyromegaly Lungs:    Mild exp wheeze; normal respiratory effort CV:         Regular rate and rhythm; no murmurs Abd:     JP drain in place Ext:    No edema; adequate peripheral perfusion Skin:      Warm and dry; no rash Neuro: No focal deficits.   LABS:  BMET  Recent Labs Lab 12/04/16 0448 12/04/16 1642 12/05/16 0446  NA 135 136 138  K 2.9*  3.3* 2.9*  CL 101 105 106  CO2 24 22 21*  BUN 55* 47* 44*  CREATININE 1.46* 1.41* 1.31*  GLUCOSE 109* 169* 113*    Electrolytes  Recent Labs Lab 12/04/16 0448 12/04/16 1642 12/05/16 0446  CALCIUM 7.4* 7.4* 7.5*  MG 2.0 2.0 2.0  PHOS 2.4* 1.8* 2.7    CBC  Recent Labs Lab 12/03/16 0430 12/04/16 0448 12/04/16 1642 12/05/16 0446  WBC 19.2* 22.0*  --  22.6*  HGB 11.2* 11.5* 11.0* 10.6*  HCT 31.6* 32.7* 31.8* 31.1*  PLT 128* 242  --  320    Coag's  Recent Labs Lab 11/30/16 1036  APTT 36  INR 1.35    Sepsis Markers  Recent Labs Lab 12/01/16 0304 12/02/16 0400 12/03/16 0430  PROCALCITON 39.98 30.83 16.20    ABG  Recent Labs Lab 11/29/16 0510  PHART 7.452*  PCO2ART 31.0*  PO2ART 117*    Liver Enzymes  Recent Labs Lab 12/02/16 0400 12/02/16 1542 12/03/16 0430 12/04/16 0448  AST 173*  --  139* 92*  ALT 109*  --  121* 105*  ALKPHOS 49  --  71 94  BILITOT 2.3*  --  2.3* 2.4*  ALBUMIN 1.8* 1.7* 1.8* 1.9*    Cardiac Enzymes  Recent Labs Lab 12/04/16 1101 12/04/16 1642 12/04/16 2251  TROPONINI 0.10* 0.11* 0.13*    Glucose  Recent Labs Lab 12/04/16 1209 12/04/16 1543 12/04/16 1925 12/04/16 2331 12/05/16 0323 12/05/16 0742  GLUCAP 173* 148* 197* 244* 151* 175*    Imaging No results found.  IMAGING/STUDIES: CTA CHEST/ABD/PELVIS 8/10: IMPRESSION: 1. Bowel perforation, with a large amount of free air and free fluid noted throughout the abdomen and pelvis. Tiny focus of air along the pylorus and proximal duodenum raises question for the site of perforation. This was better characterized on subsequent surgery. 2. Vague mild soft tissue inflammation about the proximal ileum at the left mid abdomen. 3. No evidence of aortic dissection. No evidence of aneurysmal dilatation. Scattered aortic atherosclerosis. 4. No evidence of central pulmonary embolus. 5. Vague soft tissue inflammation about the bladder may be reactive in nature, or  could reflect mild cystitis. 6. Cardiomegaly.  Scattered coronary artery calcification noted. 7. Trace right-sided pleural fluid. Patchy bibasilar airspace opacities likely reflect atelectasis. 8. Scattered diverticulosis along the ascending colon, without evidence of diverticulitis. 9. Borderline enlarged prostate. 10. Small to moderate bilateral inguinal hernias, containing only fat. TTE 8/12:  LV moderately dilated with EF 15% & diffuse hypokinesis. There is akinesis of the inferolateral and inferior myocardium. Study insufficient to assess diastolic function. LA & RA severely dilated. RV moderately dilated with moderately reduced systolic function. Pacer wires noted. Mild aortic regurgitation without stenosis. Aortic root normal in size. Moderate mitral regurgitation without stenosis. Trivial pulmonic regurgitation without stenosis. Moderate tricuspid regurgitation. No pericardial effusion. PORT CXR 8/13:  Previously reviewed by me. Silhouetting of left hemidiaphragm. Endotracheal tube in good position. Enteric feeding tube coursing below diaphragm. Right internal jugular central venous catheter in good position. Bilateral hilar fullness. CT HEAD W/O 8/14:  Decreased attenuation involving the right dentate nucleus in the right cerebellum and immediately adjacent cerebellar parenchyma, concerning for recent and potentially acute infarct in this area. Elsewhere there is atrophy with moderate periventricular small vessel disease. There is no intracranial mass, hemorrhage, or extra-axial fluid collection. Multiple foci of arterial vascular calcification noted. Areas of paranasal sinus disease. Areas of right-sided mastoid disease. RUQ U/S 8/15: IMPRESSION: 1. No acute abnormality seen at the right upper quadrant. 2. Suggestion of fatty infiltration within the liver. 3. Tiny stones and mild sludge within the gallbladder. No definite evidence for obstruction or cholecystitis. PORT CXR 8/15:  Endotracheal  tube in good position. Right internal jugular central venous catheter in good position. Enteric feeding tube coursing below diaphragm. Some improvement in bilateral lower lung zone patchy opacities. Blunting of left costophrenic angle consistent with small left pleural effusion. I have reviewed the images.   CT HEAD W/O 8/16: IMPRESSION: 1. No acute finding. No acute infarct detected in the cerebellum as questioned previously. 2. Small remote bilateral cerebellar infarcts. Chronic small vessel ischemia in the cerebral white matter.  MICROBIOLOGY: Blood Culture x2 8/9 >>>NEG Urine Culture 8/9:  E coli Abdominal Wound Culture 8/10:  Normal Skin Flora  MRSA PCR 8/10:  Negative  Blood Cultures x2 8/10:  Negative  H. Pylori IgG:  5.00 (positive) Acute Hepatitis Panel 8/15:  Negative   ANTIBIOTICS: Vancomycin 8/10 (x1 dose) Diflucan 8/10 - 8/15 Zosyn 8/10 >>> Flagyl 8/15 >>> Biaxin 8/15 >>>  SIGNIFICANT EVENTS: 08/10 - Admit w/ pneumoperitoneum >>  taken to OR emergently for omental patch of gastric ulcer perforation 08/11 - High grade fever to 105F. Stress-dosed steroid started. 08/12 - Stress-dosed steroids tapered to q12hr 08/13 - Transfer to [facility] for arterial embolectomy 08/14 - Off pressors. Stopped Steroids. Altered mentation that is worse post-op & without any drips. Heparin drip stopped w/ bleeding from op site. 08/15 - More awake & following commands. Started on triple therapy for H pylori 08/16 - No evidence of CVA on repeat CT. Heparin drip restarted for possible leg ishcemia. 08/17 - Advancing tube feedings while weaning TPN. Worsening shock.  LINES/TUBES: R BRACHIAL ART LINE 8/10 (out) OETT 8/10 >>> R IJ DL CVL 1/50 >>> ABD JP Drain 8/10 >>> Foley >>> OGT >>>  ASSESSMENT / PLAN: 71 y.o. male with multiple medical problems. S/p e lap for perf ulcer. Post op course complicated by ischemic rt hand from a line s/p thrombectomy by vascular surgery.  PULMONARY A: Acute  hypoxic respiratory failure: Multifactorial.  P: Continue daily SBTs Start duonebs for wheezing  CARDIOVASCULAR A: Right Upper Extremity Arterial Clot:  Secondary to arterial line. S/P embolectomy w/ vein patch 8/13.  Shock Biventricular Heart Failure H/O NICM:  S/P ICD 2014 EF 15% H/O Essential Hypertension H/O Hyperlipidemia Paroxysmal Atrial Fibrillation Elevated Troponin I:  Likely demand given acute illness.  H/O PAD  P: Wean down levo Continue heparin gtt, aspirin Follow troponin, check EKG.  RENAL A: Acute renal failure: Resolving.  Hypokalemia: Replaced. Hyperkalemia: Resolved. Hyponatremia: Resolved.  P: Replete K Follow repeat BMP later today  GASTROINTESTINAL A: Perforated Pyloric Channel Ulcer:  S/P Repair 8/10 by Dr. Ezzard Standing. H. pylori IgG elevated. Ileus:  Post-op. Improving. Severe Protein-Calorie Malnutrition Transaminitis:  Improving. Suspect hepatotoxicity from Diflucan.   P: Follow LFTs Off TPN, continue tube feeds  HEME A: Leukocytosis: Steadily worsening. Unclear etiology.   Thrombocytopenia:  Resolved. Likely due to splenic sequestration & consumption. Anemia: Hemoglobin stable. No signs of active bleeding at this time.  P: Follow CBC  INFECTIOUS DISEASE A: Septic Shock: Shock resolved. Peritonitis:  Secondary to perforated ulcer. E coli UTI Helicobacter pylori infection C diff negative  P: Continuing empiric Zosyn, Biaxin, & Flagyl  ENDOCRINE A: Diabetes Mellitus:  Glucose controlled. Hypoglycemia:  On 8/11.   P: SSI coverage  NEUROLOGIC A: Sedation on Ventilator Acute Encephalopathy: Significantly improved. S/P high dose IV thiamine. Possible Cerebellar CVA:  Not seen on repeat CT. Unable to get MRI w/ implanted device. Neurology previously consulted. H/O anxiety  H/O right hip pain & osteoarthritis H/O EtOH Use  P: Desired RASS:  0 to -1 Fentanyl, versed PRN  Prophylaxis:  Heparin, protinix Diet:  Tube  feeds Code Status:  Full code Disposition:  Patient remains critically ill in the ICU.  Family Update:  Updated daughter at bedside 8/17. No family at bedside.  The patient is critically ill with multiple organ system failure and requires high complexity decision making for assessment and support, frequent evaluation and titration of therapies, advanced monitoring, review of radiographic studies and interpretation of complex data.   Critical Care Time devoted to patient care services, exclusive of separately billable procedures, described in this note is 35 minutes.   Chilton Greathouse MD [facility] Pager [phone] If no answer or after 3pm call: [phone] 12/05/2016, 9:17 AM

## 2016-12-06 ENCOUNTER — Inpatient Hospital Stay (HOSPITAL_COMMUNITY): Payer: Medicare PPO

## 2016-12-06 LAB — COMPREHENSIVE METABOLIC PANEL
ALT: 60 U/L (ref 17–63)
AST: 45 U/L — AB (ref 15–41)
Albumin: 1.7 g/dL — ABNORMAL LOW (ref 3.5–5.0)
Alkaline Phosphatase: 64 U/L (ref 38–126)
Anion gap: 7 (ref 5–15)
BUN: 37 mg/dL — ABNORMAL HIGH (ref 6–20)
CHLORIDE: 111 mmol/L (ref 101–111)
CO2: 22 mmol/L (ref 22–32)
CREATININE: 1.19 mg/dL (ref 0.61–1.24)
Calcium: 7.5 mg/dL — ABNORMAL LOW (ref 8.9–10.3)
GFR, EST NON AFRICAN AMERICAN: 59 mL/min — AB (ref >60)
Glucose, Bld: 159 mg/dL — ABNORMAL HIGH (ref 65–99)
POTASSIUM: 3.3 mmol/L — AB (ref 3.5–5.1)
SODIUM: 140 mmol/L (ref 135–145)
Total Bilirubin: 1.4 mg/dL — ABNORMAL HIGH (ref 0.3–1.2)
Total Protein: 5 g/dL — ABNORMAL LOW (ref 6.5–8.1)

## 2016-12-06 LAB — CBC WITH DIFFERENTIAL/PLATELET
BASOS PCT: 0 %
Basophils Absolute: 0 10*3/uL (ref 0.0–0.1)
EOS PCT: 0 %
Eosinophils Absolute: 0 10*3/uL (ref 0.0–0.7)
HEMATOCRIT: 30.6 % — AB (ref 39.0–52.0)
Hemoglobin: 10.1 g/dL — ABNORMAL LOW (ref 13.0–17.0)
LYMPHS PCT: 7 %
Lymphs Abs: 1.1 10*3/uL (ref 0.7–4.0)
MCH: 32.8 pg (ref 26.0–34.0)
MCHC: 33 g/dL (ref 30.0–36.0)
MCV: 99.4 fL (ref 78.0–100.0)
Monocytes Absolute: 1 10*3/uL (ref 0.1–1.0)
Monocytes Relative: 6 %
NEUTROS PCT: 87 %
Neutro Abs: 14.1 10*3/uL — ABNORMAL HIGH (ref 1.7–7.7)
PLATELETS: 330 10*3/uL (ref 150–400)
RBC: 3.08 MIL/uL — AB (ref 4.22–5.81)
RDW: 14.8 % (ref 11.5–15.5)
WBC: 16.2 10*3/uL — AB (ref 4.0–10.5)

## 2016-12-06 LAB — GLUCOSE, CAPILLARY
GLUCOSE-CAPILLARY: 133 mg/dL — AB (ref 65–99)
GLUCOSE-CAPILLARY: 153 mg/dL — AB (ref 65–99)
GLUCOSE-CAPILLARY: 159 mg/dL — AB (ref 65–99)
Glucose-Capillary: 157 mg/dL — ABNORMAL HIGH (ref 65–99)
Glucose-Capillary: 174 mg/dL — ABNORMAL HIGH (ref 65–99)
Glucose-Capillary: 136 mg/dL — ABNORMAL HIGH (ref 65–99)

## 2016-12-06 LAB — PHOSPHORUS: PHOSPHORUS: 2.1 mg/dL — AB (ref 2.5–4.6)

## 2016-12-06 LAB — MAGNESIUM: Magnesium: 1.8 mg/dL (ref 1.7–2.4)

## 2016-12-06 LAB — URINE CULTURE: CULTURE: NO GROWTH

## 2016-12-06 LAB — HEPARIN LEVEL (UNFRACTIONATED): HEPARIN UNFRACTIONATED: 0.43 [IU]/mL (ref 0.30–0.70)

## 2016-12-06 MED ORDER — POTASSIUM CHLORIDE 20 MEQ/15ML (10%) PO SOLN
20.0000 meq | ORAL | Status: AC
  Administered 2016-12-06 (×2): 20 meq
  Filled 2016-12-06 (×2): qty 15

## 2016-12-06 MED ORDER — MAGNESIUM SULFATE 2 GM/50ML IV SOLN
2.0000 g | Freq: Once | INTRAVENOUS | Status: AC
  Administered 2016-12-06: 2 g via INTRAVENOUS
  Filled 2016-12-06: qty 50

## 2016-12-06 MED ORDER — SODIUM PHOSPHATES 45 MMOLE/15ML IV SOLN
10.0000 mmol | Freq: Once | INTRAVENOUS | Status: AC
  Administered 2016-12-06: 10 mmol via INTRAVENOUS
  Filled 2016-12-06: qty 3.33

## 2016-12-06 NOTE — Progress Notes (Signed)
ANTICOAGULATION CONSULT NOTE - Follow-Up Consult  Pharmacy Consult for Heparin Indication: atrial fibrillation, ischemic limb  No Known Allergies  Patient Measurements: Height: _0  (172.7 cm) Weight: 212 lb 1.3 oz (96.2 kg) IBW/kg (Calculated) : 68.4 Heparin Dosing Weight: 89.6 kg  Vital Signs: Temp: 99.3 F (37.4 C) (08/19 1143) Temp Source: Oral (08/19 1143) BP: 99/63 (08/19 1230) Pulse Rate: 28 (08/19 1230)  Labs:  Recent Labs  12/04/16 0448  12/04/16 1642 12/04/16 2000  12/05/16 0446 12/05/16 0447 12/05/16 1024 12/05/16 1630 12/05/16 2206 12/06/16 0400  HGB 11.5*  --  11.0*  --   --  10.6*  --   --   --   --  10.1*  HCT 32.7*  --  31.8*  --   --  31.1*  --   --   --   --  30.6*  PLT 242  --   --   --   --  320  --   --   --   --  330  HEPARINUNFRC  --   < >  --  0.29*  --   --  0.43  --   --   --  0.43  CREATININE 1.46*  --  1.41*  --   --  1.31*  --   --  1.30*  --  1.19  TROPONINI  --   < > 0.11*  --   < >  --   --  0.08* 0.11* 0.10*  --   < > = values in this interval not displayed.  Estimated Creatinine Clearance: 62.2 mL/min (by C-G formula based on SCr of 1.19 mg/dL).   Medical History: Past Medical History:  Diagnosis Date  . ANXIETY 02/04/2009  . CHF (congestive heart failure) ([place])   . Chronic systolic CHF (congestive heart failure) ([place])   . DIABETES MELLITUS, TYPE II 11/07/2008  . 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 HIP PAIN, RIGHT 11/06/2009  . HYPERLIPIDEMIA 11/07/2008  . HYPERTENSION 10/08/2008  . Nonischemic cardiomyopathy [facility])    s/p ICD implantation 03-2013 by Dr Lovena Le  . OSTEOARTHRITIS, KNEE, RIGHT 10/08/2008  . Peripheral arterial disease ([facility])     Medications:  Scheduled:  . aspirin  81 mg Per Tube Daily  . chlorhexidine gluconate (MEDLINE KIT)  15 mL Mouth Rinse BID  . Chlorhexidine Gluconate Cloth  6 each Topical Daily  . clarithromycin  500 mg Per Tube Q12H  . feeding supplement (PRO-STAT SUGAR FREE 64)  60 mL Per  Tube TID  . feeding supplement (VITAL HIGH PROTEIN)  1,000 mL Per Tube Q24H  . insulin aspart  0-20 Units Subcutaneous Q4H  . ipratropium-albuterol  3 mL Nebulization Q6H  . mouth rinse  15 mL Mouth Rinse QID  . metroNIDAZOLE  500 mg Per Tube Q8H  . pantoprazole (PROTONIX) IV  40 mg Intravenous Q12H  . sodium chloride flush  10-40 mL Intracatheter Q12H   Infusions:  . sodium chloride 250 mL (12/06/16 1200)  . sodium chloride 500 mL (12/03/16 0126)  . heparin 1,500 Units/hr (12/06/16 1200)  . norepinephrine (LEVOPHED) Adult infusion 9 mcg/min (12/06/16 1200)  . piperacillin-tazobactam (ZOSYN)  IV 3.375 g (12/06/16 1148)   PRN: sodium chloride, acetaminophen (TYLENOL) oral liquid 160 mg/5 mL, artificial tears, fentaNYL (SUBLIMAZE) injection, midazolam, ondansetron **OR** ondansetron (ZOFRAN) IV, sodium chloride flush  Assessment: 71 yo male admitted 8/9 with gastric ulcer perforation and peritonitis. Underwent emergent laparotomy with omental patch repair. Post op course complicated by multiorgan failure requiring  intubation and pressors.   On 8/13, Pharmacy consulted to dose IV heparin for afib and presumed ischemic right upper extremity. Heparin held 8/13 secondary to bleeding at incision site, resumed on 8/16 after repeat CT negative for acute infarct and pt had cool extremity.   Heparin currently therapeutic at 0.43, CBC stable, no S/Sx bleeding noted per RN.  Goal of Therapy:  Heparin level 0.3-0.5 - will use lower end of therapeutic goal Monitor platelets by anticoagulation protocol: Yes   Plan:  -Continue heparin at 1500 units/hr -Monitor heparin level, CBC, S/Sx bleeding daily  Uvaldo Rising, BCPS  Clinical Pharmacist Pager [phone]  12/06/2016 1:41 PM

## 2016-12-06 NOTE — Progress Notes (Signed)
Pharmacy Antibiotic Note  Jacob Pittman is a 71 y.o. male admitted on 11/26/2016 with peritonitis. Pharmacy has been consulted for Zosyn dosing. Urine culture from 8/9 grew >100k E. Coli but all other cultures have remained negative. WBC trending down slightly and pt is afebrile.  Plan: -Continue Zosyn 3.375g IV EI q8h -Monitor LOT, renal fxn.  Could Zosyn be stopped soon?   Height: 5\' 8"  (172.7 cm) Weight: 212 lb 1.3 oz (96.2 kg) IBW/kg (Calculated) : 68.4  Temp (24hrs), Avg:99 F (37.2 C), Min:97.5 F (36.4 C), Max:99.6 F (37.6 C)   Recent Labs Lab 12/02/16 0400  12/03/16 0430 12/04/16 0448 12/04/16 1642 12/05/16 0446 12/05/16 1630 12/06/16 0400  WBC 12.6*  --  19.2* 22.0*  --  22.6*  --  16.2*  CREATININE 2.61*  < > 1.85* 1.46* 1.41* 1.31* 1.30* 1.19  < > = values in this interval not displayed.  Estimated Creatinine Clearance: 62.2 mL/min (by C-G formula based on SCr of 1.19 mg/dL).    No Known Allergies  Antimicrobials this admission: 8/10 Zosyn >>  8/10 Diflucan >>8/15 8/10 Vancomycin x1 8/15 Flagyl + Biaxin >>  Microbiology results: 8/10 Abd wound: normal skin flora 8/10 MRSA PCR: neg 8/9 BCx: neg 8/9 UCx: > 100K E coli R to Unaysn/amp, sens to all others, sens to Zosyn 8/10 BCx: Neg 8/17 C. Diff: negative 8/18 Resp Cx > ngtd 8/17 UCx > neg 8/17 BCx x 2 >   Thank you for allowing pharmacy to be a part of this patient's care.  Tad Moore, BCPS  Clinical Pharmacist Pager [phone]  12/06/2016 1:43 PM

## 2016-12-06 NOTE — Progress Notes (Signed)
PULMONARY / CRITICAL CARE MEDICINE   Name: Jacob Pittman MRN: [id number] DOB: Jan 24, 1946    ADMISSION DATE:  11/26/2016   CONSULTATION DATE:  11/27/2016  REFERRING MD:  Ezzard Standing  CHIEF COMPLAINT:  Abdominal Pain  HISTORY OF PRESENT ILLNESS:  71 y.o. man with ischemic cardiomyopathy, diabetic hypertensive in poor health, admitted 8/9 with gastric ulcer perforation and peritonitis. Underwent emergent laparotomy with omental patch repair He was in [place] shock requiring epinephrine and Levophed drip. Course c/b acute ischemia of R arm requiring tx from [facility] to [facility] and urgent OR embolectomy, also c/b slow vent wean.   SUBJECTIVE:   On daily weans but does not tolerated pressures below 10/5 Continues on levo > weaning down Diarrhea is improving. C diff negative.   REVIEW OF SYSTEMS:  Unable to obtain with patient intubated.   VITAL SIGNS: BP 96/71   Pulse 91   Temp 99.6 F (37.6 C) (Oral)   Resp (!) 29   Ht 5\' 8"  (1.727 m)   Wt 96.2 kg (212 lb 1.3 oz)   SpO2 100%   BMI 32.25 kg/m   HEMODYNAMICS:    VENTILATOR SETTINGS: Vent Mode: CPAP;PSV FiO2 (%):  [30 %] 30 % Set Rate:  [25 bmp] 25 bmp Vt Set:  [550 mL] 550 mL PEEP:  [5 cmH20-10 cmH20] 10 cmH20 Plateau Pressure:  [22 cmH20-24 cmH20] 23 cmH20  INTAKE / OUTPUT:  Intake/Output Summary (Last 24 hours) at 12/06/16 0959 Last data filed at 12/06/16 0900  Gross per 24 hour  Intake          2871.56 ml  Output             2475 ml  Net           396.56 ml   PHYSICAL EXAMINATION:. Gen:      No acute distress HEENT:  EOMI, sclera anicteric, ETT in place Neck:     No masses; no thyromegaly Lungs:    Clear to auscultation bilaterally; normal respiratory effort CV:         Regular rate and rhythm; no murmurs Abd:      + bowel sounds; soft, non-tender; no palpable masses, no distension Ext:    No edema; adequate peripheral perfusion Skin:      Warm and dry; no rash Neuro: alert and oriented x 3 Psych: normal mood and  affect  LABS:  BMET  Recent Labs Lab 12/05/16 0446 12/05/16 1630 12/06/16 0400  NA 138 139 140  K 2.9* 3.6 3.3*  CL 106 109 111  CO2 21* 21* 22  BUN 44* 37* 37*  CREATININE 1.31* 1.30* 1.19  GLUCOSE 113* 205* 159*    Electrolytes  Recent Labs Lab 12/04/16 1642 12/05/16 0446 12/05/16 1630 12/06/16 0400  CALCIUM 7.4* 7.5* 7.5* 7.5*  MG 2.0 2.0  --  1.8  PHOS 1.8* 2.7  --  2.1*    CBC  Recent Labs Lab 12/04/16 0448 12/04/16 1642 12/05/16 0446 12/06/16 0400  WBC 22.0*  --  22.6* 16.2*  HGB 11.5* 11.0* 10.6* 10.1*  HCT 32.7* 31.8* 31.1* 30.6*  PLT 242  --  320 330    Coag's  Recent Labs Lab 11/30/16 1036  APTT 36  INR 1.35    Sepsis Markers  Recent Labs Lab 12/01/16 0304 12/02/16 0400 12/03/16 0430  PROCALCITON 39.98 30.83 16.20    ABG No results for input(s): PHART, PCO2ART, PO2ART in the last 168 hours.  Liver Enzymes  Recent Labs Lab 12/03/16 0430 12/04/16  0448 12/06/16 0400  AST 139* 92* 45*  ALT 121* 105* 60  ALKPHOS 71 94 64  BILITOT 2.3* 2.4* 1.4*  ALBUMIN 1.8* 1.9* 1.7*    Cardiac Enzymes  Recent Labs Lab 12/05/16 1024 12/05/16 1630 12/05/16 2206  TROPONINI 0.08* 0.11* 0.10*    Glucose  Recent Labs Lab 12/05/16 1244 12/05/16 1636 12/05/16 2019 12/05/16 2356 12/06/16 0403 12/06/16 0757  GLUCAP 211* 219* 177* 159* 157* 153*    IMAGING/STUDIES: CTA CHEST/ABD/PELVIS 8/10: IMPRESSION: 1. Bowel perforation, with a large amount of free air and free fluid noted throughout the abdomen and pelvis. Tiny focus of air along the pylorus and proximal duodenum raises question for the site of perforation. This was better characterized on subsequent surgery. 2. Vague mild soft tissue inflammation about the proximal ileum at the left mid abdomen. 3. No evidence of aortic dissection. No evidence of aneurysmal dilatation. Scattered aortic atherosclerosis. 4. No evidence of central pulmonary embolus. 5. Vague soft tissue  inflammation about the bladder may be reactive in nature, or could reflect mild cystitis. 6. Cardiomegaly.  Scattered coronary artery calcification noted. 7. Trace right-sided pleural fluid. Patchy bibasilar airspace opacities likely reflect atelectasis. 8. Scattered diverticulosis along the ascending colon, without evidence of diverticulitis. 9. Borderline enlarged prostate. 10. Small to moderate bilateral inguinal hernias, containing only fat. TTE 8/12:  LV moderately dilated with EF 15% & diffuse hypokinesis. There is akinesis of the inferolateral and inferior myocardium. Study insufficient to assess diastolic function. LA & RA severely dilated. RV moderately dilated with moderately reduced systolic function. Pacer wires noted. Mild aortic regurgitation without stenosis. Aortic root normal in size. Moderate mitral regurgitation without stenosis. Trivial pulmonic regurgitation without stenosis. Moderate tricuspid regurgitation. No pericardial effusion. CT HEAD W/O 8/14:  Decreased attenuation involving the right dentate nucleus in the right cerebellum and immediately adjacent cerebellar parenchyma, concerning for recent and potentially acute infarct in this area. Elsewhere there is atrophy with moderate periventricular small vessel disease. There is no intracranial mass, hemorrhage, or extra-axial fluid collection. Multiple foci of arterial vascular calcification noted. Areas of paranasal sinus disease. Areas of right-sided mastoid disease. RUQ U/S 8/15: IMPRESSION: 1. No acute abnormality seen at the right upper quadrant. 2. Suggestion of fatty infiltration within the liver. 3. Tiny stones and mild sludge within the gallbladder. No definite evidence for obstruction or cholecystitis. CT HEAD W/O 8/16: IMPRESSION: 1. No acute finding. No acute infarct detected in the cerebellum as questioned previously. 2. Small remote bilateral cerebellar infarcts. Chronic small vessel ischemia in the cerebral  white matter.  CXR 12/06/16- basal atelectasis, ETT and CVL in place. Images reviewed.   MICROBIOLOGY: Blood Culture x2 8/9 >>>NEG Urine Culture 8/9:  E coli Abdominal Wound Culture 8/10:  Normal Skin Flora  MRSA PCR 8/10:  Negative  Blood Cultures x2 8/10:  Negative  H. Pylori IgG:  5.00 (positive) Acute Hepatitis Panel 8/15:  Negative   ANTIBIOTICS: Vancomycin 8/10 (x1 dose) Diflucan 8/10 - 8/15 Zosyn 8/10 >>> Flagyl 8/15 >>> Biaxin 8/15 >>>  SIGNIFICANT EVENTS: 08/10 - Admit w/ pneumoperitoneum >> taken to OR emergently for omental patch of gastric ulcer perforation 08/11 - High grade fever to 105F. Stress-dosed steroid started. 08/12 - Stress-dosed steroids tapered to q12hr 08/13 - Transfer to [facility] for arterial embolectomy 08/14 - Off pressors. Stopped Steroids. Altered mentation that is worse post-op & without any drips. Heparin drip stopped w/ bleeding from op site. 08/15 - More awake & following commands. Started on triple therapy for H pylori  08/16 - No evidence of CVA on repeat CT. Heparin drip restarted for possible leg ishcemia. 08/17 - Advancing tube feedings while weaning TPN. Worsening shock.  LINES/TUBES: R BRACHIAL ART LINE 8/10 (out) OETT 8/10 >>> R IJ DL CVL 6/96 >>> ABD JP Drain 8/10 >>> Foley >>> OGT >>>  ASSESSMENT / PLAN: 71 y.o. male with multiple medical problems. S/p e lap for perf ulcer. Post op course complicated by ischemic rt hand from a line s/p thrombectomy by vascular surgery.  PULMONARY A: Acute hypoxic respiratory failure: Multifactorial.  P: Continue daily SBTs Standing duonebs for wheezing  CARDIOVASCULAR A: Right Upper Extremity Arterial Clot:  Secondary to arterial line. S/P embolectomy w/ vein patch 8/13.  Shock Biventricular Heart Failure H/O NICM:  S/P ICD 2014 EF 15% H/O Essential Hypertension H/O Hyperlipidemia Paroxysmal Atrial Fibrillation Elevated Troponin I:  Likely demand given acute illness.  Flat trend H/O  PAD  P: Wean down levo Continue heparin gtt, aspirin  RENAL A: Acute renal failure: Resolving.  Hypokalemia: Replaced. Hyperkalemia: Resolved. Hyponatremia: Resolved.  P: Replete K Monitor urine output and Cr  GASTROINTESTINAL A: Perforated Pyloric Channel Ulcer:  S/P Repair 8/10 by Dr. Ezzard Standing. H. pylori IgG elevated. Ileus:  Post-op. Improving. Severe Protein-Calorie Malnutrition Transaminitis:  Improving. Suspect hepatotoxicity from Diflucan.   P: Off TPN. tolerating tube feeds  HEME A: Leukocytosis: Improving 22.6 > 16.2 Thrombocytopenia:  Resolved. Likely due to splenic sequestration & consumption. P: Follow CBC  INFECTIOUS DISEASE A: Septic Shock: Shock resolved. Peritonitis:  Secondary to perforated ulcer. E coli UTI Helicobacter pylori infection C diff negative  P: Continuing empiric Zosyn, Biaxin, & Flagyl  ENDOCRINE A: Diabetes Mellitus:  Glucose controlled. Hypoglycemia:  On 8/11.   P: SSI coverage  NEUROLOGIC A: Sedation on Ventilator Acute Encephalopathy: Significantly improved. S/P high dose IV thiamine. Possible Cerebellar CVA:  Not seen on repeat CT. Unable to get MRI w/ implanted device. Neurology previously consulted. H/O anxiety  H/O right hip pain & osteoarthritis H/O EtOH Use  P: Desired RASS:  0 to -1 Fentanyl, versed PRN  Prophylaxis:  Heparin, protonix Diet:  Tube feeds Code Status:  Full code Disposition:  Patient remains critically ill in the ICU.  Family Update:  Updated daughter at bedside 8/17. No family at bedside 8/19.  The patient is critically ill with multiple organ system failure and requires high complexity decision making for assessment and support, frequent evaluation and titration of therapies, advanced monitoring, review of radiographic studies and interpretation of complex data.   Critical Care Time devoted to patient care services, exclusive of separately billable procedures, described in this note is  35 minutes.   Chilton Greathouse MD [facility] Pager [phone] If no answer or after 3pm call: [phone] 12/06/2016, 9:59 AM

## 2016-12-06 NOTE — Progress Notes (Signed)
Spoke with Dr Delton Coombes via elink re:  Pt has not had cxr in 4 days.  States he will place order in computer.

## 2016-12-06 NOTE — Progress Notes (Signed)
[facility] Progress Note  6 Days Post-Op  Subjective: CC:  No acute events. Denies abdominal pain. FC on vent.  Objective: Vital signs in last 24 hours: Temp:  [97.5 F (36.4 C)-100.3 F (37.9 C)] 99.6 F (37.6 C) (08/19 0759) Pulse Rate:  [41-133] 42 (08/19 0730) Resp:  [0-33] 25 (08/19 0730) BP: (90-140)/(33-97) 97/75 (08/19 0730) SpO2:  [93 %-100 %] 100 % (08/19 0730) FiO2 (%):  [30 %] 30 % (08/19 0338) Weight:  [96.2 kg (212 lb 1.3 oz)-97.7 kg (215 lb 6.2 oz)] 96.2 kg (212 lb 1.3 oz) (08/19 0600) Last BM Date: 12/06/16  Intake/Output from previous day: 08/18 0701 - 08/19 0700 In: 2953.8 [I.V.:1250.4; NG/GT:1150; IV Piggyback:553.3] Out: 2615 [Urine:2195; Drains:20; Stool:400] Intake/Output this shift: No intake/output data recorded.  PE: General appearance: alert on vent; no distress Resp: ventilated respirations; CTAB GI: soft, non-tender; + BS   Drain - 20 cc/24h SS               Stool - 400 cc/ 24h. Soft, brown. Rectal tube.   Lab Results:   Recent Labs  12/05/16 0446 12/06/16 0400  WBC 22.6* 16.2*  HGB 10.6* 10.1*  HCT 31.1* 30.6*  PLT 320 330   BMET  Recent Labs  12/05/16 1630 12/06/16 0400  NA 139 140  K 3.6 3.3*  CL 109 111  CO2 21* 22  GLUCOSE 205* 159*  BUN 37* 37*  CREATININE 1.30* 1.19  CALCIUM 7.5* 7.5*   PT/INR No results for input(s): LABPROT, INR in the last 72 hours. CMP     Component Value Date/Time   NA 140 12/06/2016 0400   K 3.3 (L) 12/06/2016 0400   CL 111 12/06/2016 0400   CO2 22 12/06/2016 0400   GLUCOSE 159 (H) 12/06/2016 0400   BUN 37 (H) 12/06/2016 0400   CREATININE 1.19 12/06/2016 0400   CREATININE 0.91 03/04/2015 1618   CALCIUM 7.5 (L) 12/06/2016 0400   PROT 5.0 (L) 12/06/2016 0400   ALBUMIN 1.7 (L) 12/06/2016 0400   AST 45 (H) 12/06/2016 0400   ALT 60 12/06/2016 0400   ALKPHOS 64 12/06/2016 0400   BILITOT 1.4 (H) 12/06/2016 0400   GFRNONAA 59 (L) 12/06/2016 0400   GFRAA >60 12/06/2016  0400   Lipase     Component Value Date/Time   LIPASE 20 12/01/2016 0304       Studies/Results: Dg Chest Port 1 View  Result Date: 12/06/2016 CLINICAL DATA:  Acute respiratory failure EXAM: PORTABLE CHEST 1 VIEW COMPARISON:  Four days ago FINDINGS: Endotracheal tube tip between the clavicular heads and carina. An orogastric tube reaches the stomach. Single chamber ICD/ pacer from the left into the right ventricle. Stable cardiomegaly. Right IJ line with tip more midline than before in the setting of rotation. It was more clearly over the SVC previously. Unchanged low volume chest with patchy basilar density and small left effusion. IMPRESSION: 1. Stable positioning of tubes and central line. 2. Low volumes with unchanged atelectasis or pneumonia and small left effusion. Electronically Signed   By: Marnee Spring M.D.   On: 12/06/2016 07:19    Anti-infectives: Anti-infectives    Start     Dose/Rate Route Frequency Ordered Stop   12/04/16 2200  clarithromycin (BIAXIN) tablet 500 mg     500 mg Per Tube Every 12 hours 12/04/16 1107     12/04/16 2000  piperacillin-tazobactam (ZOSYN) IVPB 3.375 g     3.375 g 12.5 mL/hr over 240 Minutes Intravenous Every  8 hours 12/04/16 1339     12/04/16 1600  metroNIDAZOLE (FLAGYL) tablet 500 mg     500 mg Per Tube Every 8 hours 12/04/16 1107     12/02/16 1100  clarithromycin (BIAXIN) 250 MG/5ML suspension 500 mg  Status:  Discontinued     500 mg Per Tube Every 12 hours 12/02/16 1012 12/04/16 1107   12/02/16 1045  metroNIDAZOLE (FLAGYL) 50 mg/ml oral suspension 500 mg  Status:  Discontinued     500 mg Per Tube 3 times daily 12/02/16 1036 12/04/16 1107   12/02/16 1015  metroNIDAZOLE (FLAGYL) 50 mg/ml oral suspension 250 mg  Status:  Discontinued     250 mg Per Tube 4 times daily 12/02/16 1011 12/02/16 1036   11/28/16 0800  fluconazole (DIFLUCAN) IVPB 200 mg  Status:  Discontinued     200 mg 100 mL/hr over 60 Minutes Intravenous Every 24 hours 11/27/16  0624 12/02/16 1018   11/27/16 0630  fluconazole (DIFLUCAN) IVPB 400 mg     400 mg 100 mL/hr over 120 Minutes Intravenous  Once 11/27/16 0622 11/27/16 0841   11/27/16 0600  piperacillin-tazobactam (ZOSYN) IVPB 3.375 g  Status:  Discontinued     3.375 g 12.5 mL/hr over 240 Minutes Intravenous Every 8 hours 11/27/16 0534 12/04/16 1339   11/27/16 0515  piperacillin-tazobactam (ZOSYN) IVPB 3.375 g  Status:  Discontinued     3.375 g 100 mL/hr over 30 Minutes Intravenous  Once 11/27/16 0510 11/27/16 0518   11/27/16 0030  piperacillin-tazobactam (ZOSYN) IVPB 3.375 g     3.375 g 100 mL/hr over 30 Minutes Intravenous  Once 11/27/16 0016 11/27/16 0755   11/27/16 0030  vancomycin (VANCOCIN) IVPB 1000 mg/200 mL premix     1,000 mg 200 mL/hr over 60 Minutes Intravenous  Once 11/27/16 0016 11/27/16 0756       Assessment/Plan Septic shock 2/2 perforated pyloric ulcer- cont abx, on Levo Acute ischemia right hand s/p R brachial ulnar and radial artery ebolectomy Acute hypoxic respiratory failure - vent mgmt per CCM Hx PAD, sCHD, NICM s/p ICD placement 2014 HTN HLD AKI DM Acute encephalopathy - improved today --off sedation Possible leg ischemia - heparin gtt Elevated troponin - 0.10  > 0.11 > 0.13  Perforated pyloric ulcer  POD#9S/P EXPLORATORY LAPAROTOMY, GRAM PATCH REPAIR8/10/18 Dr. Ovidio Kin - having multiple loose BMs w/ flexiseal - output decreased to 400 cc from over 1 L. c dif negative. - tube feeds atgoal rate - continue BID wet to dry dressing changes  - H. pylori IgG elevated. continue IV Protonix   Malnutrition:prealbumin <5 FEN: NPO, IVF, TF @ goal rate, hypokalemia (3.3), hypocalcemia (7.5), hypophosphatemia (2.1) - being replaced per primary ID: Zosyn 8/10 >>, Flagyl 8/15 >>, Biaxin 8/15 >>  WBC 16.2 from 22.6- resp/urine/blood CX pending,  - doubt intra-abdominal infection as patient has multiple other acute events that have occurred and he has regained bowel  function. However, if other sources ruled out, may consider CT Abd/ Pelvis   VTE: SCD's, heparin gtt  Dispo: Leave drain in place Continue tube feeds Continue current wound care    LOS: 9 days    Adam Phenix , [facility] 12/06/2016, 8:02 AM Pager: [phone] Consults: [phone] Mon-Fri 7:00 am-4:30 pm Sat-Sun 7:00 am-11:30 am

## 2016-12-06 NOTE — Progress Notes (Signed)
[facility] ADULT ICU REPLACEMENT PROTOCOL FOR AM LAB REPLACEMENT ONLY  The patient does apply for the [facility] Adult ICU Electrolyte Replacment Protocol based on the criteria listed below:   1. Is GFR >/= 40 ml/min? Yes.    Patient's GFR today is >60 2. Is urine output >/= 0.5 ml/kg/hr for the last 6 hours? Yes.   Patient's UOP is 0.7 ml/kg/hr 3. Is BUN < 60 mg/dL? Yes.    Patient's BUN today is 37 4. Abnormal electrolyte(s): K+3.3 Mg 1.8  Phos 2.1 5. Ordered repletion with: protocol 6. If a panic level lab has been reported, has the CCM MD in charge been notified? Yes.  .   Physician:  Idalia Needle [facility] 12/06/2016 4:47 AM

## 2016-12-07 DIAGNOSIS — R579 Shock, unspecified: Secondary | ICD-10-CM

## 2016-12-07 LAB — RENAL FUNCTION PANEL
ANION GAP: 7 (ref 5–15)
Albumin: 1.7 g/dL — ABNORMAL LOW (ref 3.5–5.0)
BUN: 34 mg/dL — ABNORMAL HIGH (ref 6–20)
CALCIUM: 7.9 mg/dL — AB (ref 8.9–10.3)
CO2: 22 mmol/L (ref 22–32)
CREATININE: 1 mg/dL (ref 0.61–1.24)
Chloride: 115 mmol/L — ABNORMAL HIGH (ref 101–111)
Glucose, Bld: 143 mg/dL — ABNORMAL HIGH (ref 65–99)
Phosphorus: 2.2 mg/dL — ABNORMAL LOW (ref 2.5–4.6)
Potassium: 3.4 mmol/L — ABNORMAL LOW (ref 3.5–5.1)
SODIUM: 144 mmol/L (ref 135–145)

## 2016-12-07 LAB — CBC WITH DIFFERENTIAL/PLATELET
Basophils Absolute: 0 10*3/uL (ref 0.0–0.1)
Basophils Relative: 0 %
EOS PCT: 0 %
Eosinophils Absolute: 0 10*3/uL (ref 0.0–0.7)
HCT: 28.9 % — ABNORMAL LOW (ref 39.0–52.0)
Hemoglobin: 9.6 g/dL — ABNORMAL LOW (ref 13.0–17.0)
LYMPHS ABS: 0.8 10*3/uL (ref 0.7–4.0)
LYMPHS PCT: 7 %
MCH: 33.4 pg (ref 26.0–34.0)
MCHC: 33.2 g/dL (ref 30.0–36.0)
MCV: 100.7 fL — AB (ref 78.0–100.0)
MONO ABS: 0.9 10*3/uL (ref 0.1–1.0)
MONOS PCT: 8 %
Neutro Abs: 9.3 10*3/uL — ABNORMAL HIGH (ref 1.7–7.7)
Neutrophils Relative %: 85 %
PLATELETS: 345 10*3/uL (ref 150–400)
RBC: 2.87 MIL/uL — AB (ref 4.22–5.81)
RDW: 15.3 % (ref 11.5–15.5)
WBC: 11 10*3/uL — AB (ref 4.0–10.5)

## 2016-12-07 LAB — CULTURE, RESPIRATORY: CULTURE: NO GROWTH

## 2016-12-07 LAB — GLUCOSE, CAPILLARY
GLUCOSE-CAPILLARY: 151 mg/dL — AB (ref 65–99)
GLUCOSE-CAPILLARY: 155 mg/dL — AB (ref 65–99)
GLUCOSE-CAPILLARY: 161 mg/dL — AB (ref 65–99)
Glucose-Capillary: 113 mg/dL — ABNORMAL HIGH (ref 65–99)
Glucose-Capillary: 118 mg/dL — ABNORMAL HIGH (ref 65–99)
Glucose-Capillary: 153 mg/dL — ABNORMAL HIGH (ref 65–99)
Glucose-Capillary: 153 mg/dL — ABNORMAL HIGH (ref 65–99)

## 2016-12-07 LAB — BASIC METABOLIC PANEL
ANION GAP: 5 (ref 5–15)
BUN: 35 mg/dL — ABNORMAL HIGH (ref 6–20)
CO2: 23 mmol/L (ref 22–32)
Calcium: 7.7 mg/dL — ABNORMAL LOW (ref 8.9–10.3)
Chloride: 115 mmol/L — ABNORMAL HIGH (ref 101–111)
Creatinine, Ser: 1 mg/dL (ref 0.61–1.24)
GLUCOSE: 133 mg/dL — AB (ref 65–99)
POTASSIUM: 3.2 mmol/L — AB (ref 3.5–5.1)
Sodium: 143 mmol/L (ref 135–145)

## 2016-12-07 LAB — CULTURE, RESPIRATORY W GRAM STAIN

## 2016-12-07 LAB — COOXEMETRY PANEL
Carboxyhemoglobin: 1.6 % — ABNORMAL HIGH (ref 0.5–1.5)
Methemoglobin: 0.8 % (ref 0.0–1.5)
O2 Saturation: 72 %
TOTAL HEMOGLOBIN: 8.3 g/dL — AB (ref 12.0–16.0)

## 2016-12-07 LAB — HEPARIN LEVEL (UNFRACTIONATED): Heparin Unfractionated: 0.41 IU/mL (ref 0.30–0.70)

## 2016-12-07 LAB — MAGNESIUM: Magnesium: 1.8 mg/dL (ref 1.7–2.4)

## 2016-12-07 MED ORDER — ORAL CARE MOUTH RINSE
15.0000 mL | OROMUCOSAL | Status: DC
  Administered 2016-12-07 – 2016-12-08 (×14): 15 mL via OROMUCOSAL

## 2016-12-07 MED ORDER — FUROSEMIDE 10 MG/ML IJ SOLN
20.0000 mg | Freq: Four times a day (QID) | INTRAMUSCULAR | Status: AC
  Administered 2016-12-07 (×3): 20 mg via INTRAVENOUS
  Filled 2016-12-07 (×3): qty 2

## 2016-12-07 MED ORDER — POTASSIUM CHLORIDE 20 MEQ/15ML (10%) PO SOLN
40.0000 meq | Freq: Once | ORAL | Status: AC
  Administered 2016-12-07: 40 meq
  Filled 2016-12-07: qty 30

## 2016-12-07 MED ORDER — CHLORHEXIDINE GLUCONATE 0.12% ORAL RINSE (MEDLINE KIT)
15.0000 mL | Freq: Two times a day (BID) | OROMUCOSAL | Status: DC
  Administered 2016-12-07 – 2017-01-13 (×65): 15 mL via OROMUCOSAL

## 2016-12-07 NOTE — Anesthesia Postprocedure Evaluation (Signed)
Anesthesia Post Note  Patient: Jacob Pittman  Procedure(s) Performed: Procedure(s) (LRB): EXPLORATORY LAPAROTOMY abdominal exploration oversew pyloric channel ulcer gram patch repair perforated ulcer (N/A)     Patient location during evaluation: ICU Anesthesia Type: General Level of consciousness: patient remains intubated per anesthesia plan Pain management: pain level controlled Vital Signs Assessment: post-procedure vital signs reviewed and stable Respiratory status: patient remains intubated per anesthesia plan Cardiovascular status: stable Postop Assessment: no signs of nausea or vomiting Anesthetic complications: no    Last Vitals:  Vitals:   12/07/16 0827 12/07/16 0829  BP:  122/85  Pulse:    Resp:    Temp: 36.9 C   SpO2:  99%    Last Pain:  Vitals:   12/07/16 0827  TempSrc: Oral  PainSc:                  Ahman Dugdale

## 2016-12-07 NOTE — Progress Notes (Signed)
7 Days Post-Op   Subjective/Chief Complaint: Intubated, responsive   Objective: Vital signs in last 24 hours: Temp:  [98.4 F (36.9 C)-99.3 F (37.4 C)] 98.4 F (36.9 C) (08/20 0827) Pulse Rate:  [28-124] 90 (08/20 0800) Resp:  [22-35] 25 (08/20 0800) BP: (86-124)/(48-87) 122/85 (08/20 0829) SpO2:  [98 %-100 %] 99 % (08/20 0829) FiO2 (%):  [30 %] 30 % (08/20 0836) Weight:  [97.1 kg (214 lb 1.1 oz)] 97.1 kg (214 lb 1.1 oz) (08/20 0500) Last BM Date: 12/06/16  Intake/Output from previous day: 08/19 0701 - 08/20 0700 In: 2141.8 [I.V.:871.8; NG/GT:1120; IV Piggyback:150] Out: 2050 [Urine:1330; Drains:20; Stool:700] Intake/Output this shift: No intake/output data recorded.  GI: drain with serous fluid, some bs approp tender nondistended wound with exudate and some necrosis but no deshicence  Lab Results:   Recent Labs  12/06/16 0400 12/07/16 0545  WBC 16.2* 11.0*  HGB 10.1* 9.6*  HCT 30.6* 28.9*  PLT 330 345   BMET  Recent Labs  12/06/16 0400 12/07/16 0545  NA 140 143  K 3.3* 3.2*  CL 111 115*  CO2 22 23  GLUCOSE 159* 133*  BUN 37* 35*  CREATININE 1.19 1.00  CALCIUM 7.5* 7.7*   PT/INR No results for input(s): LABPROT, INR in the last 72 hours. ABG No results for input(s): PHART, HCO3 in the last 72 hours.  Invalid input(s): PCO2, PO2  Studies/Results: Dg Chest Port 1 View  Result Date: 12/06/2016 CLINICAL DATA:  Acute respiratory failure EXAM: PORTABLE CHEST 1 VIEW COMPARISON:  Four days ago FINDINGS: Endotracheal tube tip between the clavicular heads and carina. An orogastric tube reaches the stomach. Single chamber ICD/ pacer from the left into the right ventricle. Stable cardiomegaly. Right IJ line with tip more midline than before in the setting of rotation. It was more clearly over the SVC previously. Unchanged low volume chest with patchy basilar density and small left effusion. IMPRESSION: 1. Stable positioning of tubes and central line. 2. Low  volumes with unchanged atelectasis or pneumonia and small left effusion. Electronically Signed   By: Marnee Spring M.D.   On: 12/06/2016 07:19    Anti-infectives: Anti-infectives    Start     Dose/Rate Route Frequency Ordered Stop   12/04/16 2200  clarithromycin (BIAXIN) tablet 500 mg     500 mg Per Tube Every 12 hours 12/04/16 1107     12/04/16 2000  piperacillin-tazobactam (ZOSYN) IVPB 3.375 g     3.375 g 12.5 mL/hr over 240 Minutes Intravenous Every 8 hours 12/04/16 1339     12/04/16 1600  metroNIDAZOLE (FLAGYL) tablet 500 mg     500 mg Per Tube Every 8 hours 12/04/16 1107     12/02/16 1100  clarithromycin (BIAXIN) 250 MG/5ML suspension 500 mg  Status:  Discontinued     500 mg Per Tube Every 12 hours 12/02/16 1012 12/04/16 1107   12/02/16 1045  metroNIDAZOLE (FLAGYL) 50 mg/ml oral suspension 500 mg  Status:  Discontinued     500 mg Per Tube 3 times daily 12/02/16 1036 12/04/16 1107   12/02/16 1015  metroNIDAZOLE (FLAGYL) 50 mg/ml oral suspension 250 mg  Status:  Discontinued     250 mg Per Tube 4 times daily 12/02/16 1011 12/02/16 1036   11/28/16 0800  fluconazole (DIFLUCAN) IVPB 200 mg  Status:  Discontinued     200 mg 100 mL/hr over 60 Minutes Intravenous Every 24 hours 11/27/16 0624 12/02/16 1018   11/27/16 0630  fluconazole (DIFLUCAN) IVPB 400 mg  400 mg 100 mL/hr over 120 Minutes Intravenous  Once 11/27/16 0622 11/27/16 0841   11/27/16 0600  piperacillin-tazobactam (ZOSYN) IVPB 3.375 g  Status:  Discontinued     3.375 g 12.5 mL/hr over 240 Minutes Intravenous Every 8 hours 11/27/16 0534 12/04/16 1339   11/27/16 0515  piperacillin-tazobactam (ZOSYN) IVPB 3.375 g  Status:  Discontinued     3.375 g 100 mL/hr over 30 Minutes Intravenous  Once 11/27/16 0510 11/27/16 0518   11/27/16 0030  piperacillin-tazobactam (ZOSYN) IVPB 3.375 g     3.375 g 100 mL/hr over 30 Minutes Intravenous  Once 11/27/16 0016 11/27/16 0755   11/27/16 0030  vancomycin (VANCOCIN) IVPB 1000 mg/200 mL  premix     1,000 mg 200 mL/hr over 60 Minutes Intravenous  Once 11/27/16 0016 11/27/16 0756      Assessment/Plan: Perforated pyloric ulcer  POD#10S/P EXPLORATORY LAPAROTOMY, GRAM PATCH REPAIR8/10/18 Dr. Ovidio Kin - flexiseal-  c diff negative. - tube feeds atgoal rate, continue - continue BID wet to dry dressing changes, may place vac at some point, this wound is certainly at risk and needs to be monitoried - H. pylori IgG elevated. continue IV Protonix   Malnutrition:prealbumin <5, continue tube feeds, once extubated and cleared to swallow can start diet and ng can be removed. ID: really does not need abx from intraabdominal process at this point, wbc continues to decrease - doubt intra-abdominal infection as patient has multiple other acute events that have occurred and he has regained bowel function. However, if other sources ruled out, may consider CT Abd/ Pelvis     [facility] 12/07/2016

## 2016-12-07 NOTE — Progress Notes (Signed)
PULMONARY / CRITICAL CARE MEDICINE   Name: Jacob Pittman MRN: [id number] DOB: 1946/03/18    ADMISSION DATE:  11/26/2016   CONSULTATION DATE:  11/27/2016  REFERRING MD:  Ezzard Standing  CHIEF COMPLAINT:  Abdominal Pain  HISTORY OF PRESENT ILLNESS:  71 y.o. man with ischemic cardiomyopathy, diabetic hypertensive in poor health, admitted 8/9 with gastric ulcer perforation and peritonitis. Underwent emergent laparotomy with omental patch repair He was in [place] shock requiring epinephrine and Levophed drip. Course c/b acute ischemia of R arm requiring tx from [facility] to [facility] and urgent OR embolectomy, also c/b slow vent wean.   SUBJECTIVE:   Failed SBT this AM CXR with overload. Still on levo at 8 Denies Pain  VITAL SIGNS: BP 113/63   Pulse (!) 122   Temp 98.4 F (36.9 C) (Oral)   Resp (!) 26   Ht 5\' 8"  (1.727 m)   Wt 97.1 kg (214 lb 1.1 oz)   SpO2 100%   BMI 32.55 kg/m   HEMODYNAMICS:    VENTILATOR SETTINGS: Vent Mode: PRVC FiO2 (%):  [30 %] 30 % Set Rate:  [25 bmp] 25 bmp Vt Set:  [550 mL] 550 mL PEEP:  [5 cmH20] 5 cmH20 Pressure Support:  [10 cmH20] 10 cmH20 Plateau Pressure:  [20 cmH20-23 cmH20] 22 cmH20  INTAKE / OUTPUT:  Intake/Output Summary (Last 24 hours) at 12/07/16 0916 Last data filed at 12/07/16 4098  Gross per 24 hour  Intake          1996.45 ml  Output             1865 ml  Net           131.45 ml   PHYSICAL EXAMINATION:. General:  Elderly male in NAD on vent Neuro:  Awake, alert, non-focal. Nods appropriately.  HEENT:  [place]/AT, No JVD noted, PERRL Cardiovascular:  RRR, no MRG Lungs:  Coarse bilateral breath sounds Abdomen:  Soft, non-distended, non-tender Musculoskeletal:  No acute deformity or ROM limitation Skin:  Intact, MMM   LABS:  BMET  Recent Labs Lab 12/05/16 1630 12/06/16 0400 12/07/16 0545  NA 139 140 143  K 3.6 3.3* 3.2*  CL 109 111 115*  CO2 21* 22 23  BUN 37* 37* 35*  CREATININE 1.30* 1.19 1.00  GLUCOSE 205* 159* 133*     Electrolytes  Recent Labs Lab 12/04/16 1642 12/05/16 0446 12/05/16 1630 12/06/16 0400 12/07/16 0545  CALCIUM 7.4* 7.5* 7.5* 7.5* 7.7*  MG 2.0 2.0  --  1.8 1.8  PHOS 1.8* 2.7  --  2.1*  --     CBC  Recent Labs Lab 12/05/16 0446 12/06/16 0400 12/07/16 0545  WBC 22.6* 16.2* 11.0*  HGB 10.6* 10.1* 9.6*  HCT 31.1* 30.6* 28.9*  PLT 320 330 345    Coag's  Recent Labs Lab 11/30/16 1036  APTT 36  INR 1.35    Sepsis Markers  Recent Labs Lab 12/01/16 0304 12/02/16 0400 12/03/16 0430  PROCALCITON 39.98 30.83 16.20    ABG No results for input(s): PHART, PCO2ART, PO2ART in the last 168 hours.  Liver Enzymes  Recent Labs Lab 12/03/16 0430 12/04/16 0448 12/06/16 0400  AST 139* 92* 45*  ALT 121* 105* 60  ALKPHOS 71 94 64  BILITOT 2.3* 2.4* 1.4*  ALBUMIN 1.8* 1.9* 1.7*    Cardiac Enzymes  Recent Labs Lab 12/05/16 1024 12/05/16 1630 12/05/16 2206  TROPONINI 0.08* 0.11* 0.10*    Glucose  Recent Labs Lab 12/06/16 1140 12/06/16 1635 12/06/16 2016 12/07/16 0003  12/07/16 0357 12/07/16 0823  GLUCAP 174* 136* 133* 153* 155* 113*    IMAGING/STUDIES: CTA CHEST/ABD/PELVIS 8/10: IMPRESSION: 1. Bowel perforation, with a large amount of free air and free fluid noted throughout the abdomen and pelvis. Tiny focus of air along the pylorus and proximal duodenum raises question for the site of perforation. This was better characterized on subsequent surgery. 2. Vague mild soft tissue inflammation about the proximal ileum at the left mid abdomen. 3. No evidence of aortic dissection. No evidence of aneurysmal dilatation. Scattered aortic atherosclerosis. 4. No evidence of central pulmonary embolus. 5. Vague soft tissue inflammation about the bladder may be reactive in nature, or could reflect mild cystitis. 6. Cardiomegaly.  Scattered coronary artery calcification noted. 7. Trace right-sided pleural fluid. Patchy bibasilar airspace opacities likely  reflect atelectasis. 8. Scattered diverticulosis along the ascending colon, without evidence of diverticulitis. 9. Borderline enlarged prostate. 10. Small to moderate bilateral inguinal hernias, containing only fat. TTE 8/12:  LV moderately dilated with EF 15% & diffuse hypokinesis. There is akinesis of the inferolateral and inferior myocardium. Study insufficient to assess diastolic function. LA & RA severely dilated. RV moderately dilated with moderately reduced systolic function. Pacer wires noted. Mild aortic regurgitation without stenosis. Aortic root normal in size. Moderate mitral regurgitation without stenosis. Trivial pulmonic regurgitation without stenosis. Moderate tricuspid regurgitation. No pericardial effusion. CT HEAD W/O 8/14:  Decreased attenuation involving the right dentate nucleus in the right cerebellum and immediately adjacent cerebellar parenchyma, concerning for recent and potentially acute infarct in this area. Elsewhere there is atrophy with moderate periventricular small vessel disease. There is no intracranial mass, hemorrhage, or extra-axial fluid collection. Multiple foci of arterial vascular calcification noted. Areas of paranasal sinus disease. Areas of right-sided mastoid disease. RUQ U/S 8/15: IMPRESSION: 1. No acute abnormality seen at the right upper quadrant. 2. Suggestion of fatty infiltration within the liver. 3. Tiny stones and mild sludge within the gallbladder. No definite evidence for obstruction or cholecystitis. CT HEAD W/O 8/16: IMPRESSION: 1. No acute finding. No acute infarct detected in the cerebellum as questioned previously. 2. Small remote bilateral cerebellar infarcts. Chronic small vessel ischemia in the cerebral white matter.  CXR 12/06/16- basal atelectasis, ETT and CVL in place. Images reviewed.   MICROBIOLOGY: Blood Culture x2 8/9 >>>NEG Urine Culture 8/9:  E coli Abdominal Wound Culture 8/10:  Normal Skin Flora  MRSA PCR 8/10:  Negative   Blood Cultures x2 8/10:  Negative  H. Pylori IgG:  5.00 (positive) Acute Hepatitis Panel 8/15:  Negative   ANTIBIOTICS: Vancomycin 8/10 (x1 dose) Diflucan 8/10 - 8/15 Zosyn 8/10 >>> Flagyl 8/15 >>> Biaxin 8/15 >>>  SIGNIFICANT EVENTS: 08/10 - Admit w/ pneumoperitoneum >> taken to OR emergently for omental patch of gastric ulcer perforation 08/11 - High grade fever to 105F. Stress-dosed steroid started. 08/12 - Stress-dosed steroids tapered to q12hr 08/13 - Transfer to [facility] for arterial embolectomy 08/14 - Off pressors. Stopped Steroids. Altered mentation that is worse post-op & without any drips. Heparin drip stopped w/ bleeding from op site. 08/15 - More awake & following commands. Started on triple therapy for H pylori 08/16 - No evidence of CVA on repeat CT. Heparin drip restarted for possible leg ishcemia. 08/17 - Advancing tube feedings while weaning TPN. Worsening shock. 8/20 - Tol TF, still on Levo weaning down. Fail SBT. Waking up fine.  LINES/TUBES: R BRACHIAL ART LINE 8/10 (out) OETT 8/10 >>> R IJ DL CVL 1/61 >>> ABD JP Drain 8/10 >>> Foley >>>  OGT >>>  ASSESSMENT / PLAN: 70 y.o. male with multiple medical problems. S/p e lap for perf ulcer. Post op course complicated by ischemic rt hand from a line s/p thrombectomy by vascular surgery.  PULMONARY A: Acute hypoxic respiratory failure: Multifactorial. Large component likely due to volume overload.   P: Continue daily SBTs Duonebs for wheezing Diuresis limited by shock  CARDIOVASCULAR A: Right Upper Extremity Arterial Clot:  Secondary to arterial line. S/P embolectomy w/ vein patch 8/13.  Shock Biventricular Heart Failure Elevated Troponin I:  Likely demand given acute illness.  Flat trend Paroxysmal Atrial Fibrillation H/O NICM:  S/P ICD 2014 EF 15%, Essential Hypertension, Hyperlipidemia, PAD  P: ICU monitoring Wean down levo to off if able, looks like there is some room to do this today Consider  lasix infusion if unable to come off levophed Continue heparin gtt, aspirin  RENAL A: Acute renal failure: Resolving.  Hypokalemia: Replaced.  P: 11L pos Replete K Monitor urine output and Cr  GASTROINTESTINAL A: Perforated Pyloric Channel Ulcer:  S/P Repair 8/10 by Dr. Ezzard Standing. H. pylori IgG elevated. Ileus:  Post-op. Improving. Severe Protein-Calorie Malnutrition Transaminitis:  Improving. Suspect hepatotoxicity from Diflucan.   P: Off TPN. Tolerating tube feeds Surgery following Protonix for SUP  HEME A: Leukocytosis: Improving 22.6 > 11 Thrombocytopenia:  Resolved. Likely due to splenic sequestration & consumption. P: Follow CBC Heparin infusion for AF  INFECTIOUS DISEASE A: Septic Shock improving Peritonitis:  Secondary to perforated ulcer. E coli UTI Helicobacter pylori infection C diff negative  P: Continuing empiric Zosyn, Biaxin, & Flagyl  ENDOCRINE A: Diabetes Mellitus:  Glucose controlled. Hypoglycemia:  On 8/11.   P: SSI coverage  NEUROLOGIC A: Sedation on Ventilator Acute Encephalopathy: Significantly improved. S/P high dose IV thiamine. Possible Cerebellar CVA:  Not seen on repeat CT. Unable to get MRI w/ implanted device. Neurology previously consulted. H/O anxiety, right hip pain & osteoarthritis, EtOH Use  P: Desired RASS:  0 to -1 Fentanyl, versed PRN  Family Update:  Updated daughter at bedside 8/17. No family at bedside 8/20.   Joneen Roach, AGACNP-BC [facility] Pulmonology/Critical Care Pager [phone] or [phone]  12/07/2016 9:24 AM

## 2016-12-07 NOTE — Progress Notes (Signed)
ANTICOAGULATION CONSULT NOTE - Follow-Up Consult  Pharmacy Consult for Heparin Indication: atrial fibrillation, ischemic limb  No Known Allergies  Patient Measurements: Height: '5\' 8"'$  (172.7 cm) Weight: 214 lb 1.1 oz (97.1 kg) IBW/kg (Calculated) : 68.4 Heparin Dosing Weight: 89.6 kg  Vital Signs: Temp: 98.8 F (37.1 C) (08/20 0358) Temp Source: Oral (08/20 0358) BP: 99/70 (08/20 0600) Pulse Rate: 93 (08/20 0600)  Labs:  Recent Labs  12/05/16 0446 12/05/16 0447 12/05/16 1024 12/05/16 1630 12/05/16 2206 12/06/16 0400 12/07/16 0545  HGB 10.6*  --   --   --   --  10.1* 9.6*  HCT 31.1*  --   --   --   --  30.6* 28.9*  PLT 320  --   --   --   --  330 345  HEPARINUNFRC  --  0.43  --   --   --  0.43 0.41  CREATININE 1.31*  --   --  1.30*  --  1.19 1.00  TROPONINI  --   --  0.08* 0.11* 0.10*  --   --     Estimated Creatinine Clearance: 74.4 mL/min (by C-G formula based on SCr of 1 mg/dL).   Medical History: Past Medical History:  Diagnosis Date  . ANXIETY 02/04/2009  . CHF (congestive heart failure) ([place])   . Chronic systolic CHF (congestive heart failure) ([place])   . DIABETES MELLITUS, TYPE II 11/07/2008  . 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 HIP PAIN, RIGHT 11/06/2009  . HYPERLIPIDEMIA 11/07/2008  . HYPERTENSION 10/08/2008  . Nonischemic cardiomyopathy [facility])    s/p ICD implantation 03-2013 by Dr Lovena Le  . OSTEOARTHRITIS, KNEE, RIGHT 10/08/2008  . Peripheral arterial disease (HCC)     Medications:  Scheduled:  . aspirin  81 mg Per Tube Daily  . chlorhexidine gluconate (MEDLINE KIT)  15 mL Mouth Rinse BID  . chlorhexidine gluconate (MEDLINE KIT)  15 mL Mouth Rinse BID  . Chlorhexidine Gluconate Cloth  6 each Topical Daily  . clarithromycin  500 mg Per Tube Q12H  . feeding supplement (PRO-STAT SUGAR FREE 64)  60 mL Per Tube TID  . feeding supplement (VITAL HIGH PROTEIN)  1,000 mL Per Tube Q24H  . insulin aspart  0-20 Units Subcutaneous Q4H  .  ipratropium-albuterol  3 mL Nebulization Q6H  . mouth rinse  15 mL Mouth Rinse QID  . mouth rinse  15 mL Mouth Rinse 10 times per day  . metroNIDAZOLE  500 mg Per Tube Q8H  . pantoprazole (PROTONIX) IV  40 mg Intravenous Q12H  . sodium chloride flush  10-40 mL Intracatheter Q12H   Infusions:  . sodium chloride 250 mL (12/06/16 1800)  . sodium chloride 500 mL (12/03/16 0126)  . heparin 1,500 Units/hr (12/06/16 1800)  . norepinephrine (LEVOPHED) Adult infusion 8 mcg/min (12/07/16 0300)  . piperacillin-tazobactam (ZOSYN)  IV 3.375 g (12/07/16 0403)   PRN: sodium chloride, acetaminophen (TYLENOL) oral liquid 160 mg/5 mL, artificial tears, fentaNYL (SUBLIMAZE) injection, midazolam, ondansetron **OR** ondansetron (ZOFRAN) IV, sodium chloride flush  Assessment: 71 yo male admitted 8/9 with gastric ulcer perforation and peritonitis. Underwent emergent laparotomy with omental patch repair. Post op course complicated by multiorgan failure requiring intubation and pressors.   On 8/13, Pharmacy consulted to dose IV heparin for afib and presumed ischemic right upper extremity. Heparin held 8/13 secondary to bleeding at incision site, resumed on 8/16 after repeat CT negative for acute infarct and pt had cool extremity.   Heparin currently therapeutic at 0.41, CBC  stable, no S/Sx bleeding noted per RN.  Goal of Therapy:  Heparin level 0.3-0.5 - will use lower end of therapeutic goal Monitor platelets by anticoagulation protocol: Yes   Plan:  -Continue heparin at 1500 units/hr -Monitor heparin level, CBC, S/Sx bleeding daily  Arrie Senate, PharmD PGY-2 Cardiology Pharmacy Resident Pager: [phone] 12/07/2016

## 2016-12-08 ENCOUNTER — Inpatient Hospital Stay (HOSPITAL_COMMUNITY): Payer: Medicare PPO

## 2016-12-08 ENCOUNTER — Encounter (INDEPENDENT_AMBULATORY_CARE_PROVIDER_SITE_OTHER): Payer: Medicare PPO | Admitting: Podiatry

## 2016-12-08 DIAGNOSIS — J9601 Acute respiratory failure with hypoxia: Secondary | ICD-10-CM

## 2016-12-08 LAB — CBC WITH DIFFERENTIAL/PLATELET
BASOS ABS: 0 10*3/uL (ref 0.0–0.1)
BASOS PCT: 0 %
Eosinophils Absolute: 0 10*3/uL (ref 0.0–0.7)
Eosinophils Relative: 0 %
HEMATOCRIT: 29.8 % — AB (ref 39.0–52.0)
HEMOGLOBIN: 9.8 g/dL — AB (ref 13.0–17.0)
Lymphocytes Relative: 8 %
Lymphs Abs: 0.8 10*3/uL (ref 0.7–4.0)
MCH: 33.1 pg (ref 26.0–34.0)
MCHC: 32.9 g/dL (ref 30.0–36.0)
MCV: 100.7 fL — ABNORMAL HIGH (ref 78.0–100.0)
Monocytes Absolute: 0.9 10*3/uL (ref 0.1–1.0)
Monocytes Relative: 10 %
NEUTROS ABS: 7.3 10*3/uL (ref 1.7–7.7)
NEUTROS PCT: 82 %
Platelets: 381 10*3/uL (ref 150–400)
RBC: 2.96 MIL/uL — AB (ref 4.22–5.81)
RDW: 15.7 % — ABNORMAL HIGH (ref 11.5–15.5)
WBC: 9 10*3/uL (ref 4.0–10.5)

## 2016-12-08 LAB — GLUCOSE, CAPILLARY
GLUCOSE-CAPILLARY: 118 mg/dL — AB (ref 65–99)
GLUCOSE-CAPILLARY: 127 mg/dL — AB (ref 65–99)
GLUCOSE-CAPILLARY: 164 mg/dL — AB (ref 65–99)
Glucose-Capillary: 99 mg/dL (ref 65–99)
Glucose-Capillary: 144 mg/dL — ABNORMAL HIGH (ref 65–99)
Glucose-Capillary: 116 mg/dL — ABNORMAL HIGH (ref 65–99)

## 2016-12-08 LAB — MAGNESIUM: MAGNESIUM: 1.7 mg/dL (ref 1.7–2.4)

## 2016-12-08 LAB — RENAL FUNCTION PANEL
ALBUMIN: 1.8 g/dL — AB (ref 3.5–5.0)
Anion gap: 9 (ref 5–15)
BUN: 38 mg/dL — AB (ref 6–20)
CALCIUM: 8 mg/dL — AB (ref 8.9–10.3)
CO2: 23 mmol/L (ref 22–32)
Chloride: 114 mmol/L — ABNORMAL HIGH (ref 101–111)
Creatinine, Ser: 1.06 mg/dL (ref 0.61–1.24)
GFR calc Af Amer: >60 mL/min (ref >60)
GFR calc non Af Amer: >60 mL/min (ref >60)
GLUCOSE: 111 mg/dL — AB (ref 65–99)
PHOSPHORUS: 3.1 mg/dL (ref 2.5–4.6)
Potassium: 3.2 mmol/L — ABNORMAL LOW (ref 3.5–5.1)
SODIUM: 146 mmol/L — AB (ref 135–145)

## 2016-12-08 LAB — HEPARIN LEVEL (UNFRACTIONATED): HEPARIN UNFRACTIONATED: 0.37 [IU]/mL (ref 0.30–0.70)

## 2016-12-08 MED ORDER — ORAL CARE MOUTH RINSE
15.0000 mL | Freq: Four times a day (QID) | OROMUCOSAL | Status: DC
  Administered 2016-12-08 – 2017-01-07 (×99): 15 mL via OROMUCOSAL

## 2016-12-08 MED ORDER — FENTANYL 2500MCG IN NS 250ML (10MCG/ML) PREMIX INFUSION
0.0000 ug/h | INTRAVENOUS | Status: DC
  Administered 2016-12-09: 100 ug/h via INTRAVENOUS
  Filled 2016-12-08: qty 250

## 2016-12-08 MED ORDER — FUROSEMIDE 10 MG/ML IJ SOLN
20.0000 mg | Freq: Four times a day (QID) | INTRAMUSCULAR | Status: AC
  Administered 2016-12-08 (×2): 20 mg via INTRAVENOUS
  Filled 2016-12-08 (×2): qty 2

## 2016-12-08 MED ORDER — ACETYLCYSTEINE 20 % IN SOLN
4.0000 mL | RESPIRATORY_TRACT | Status: AC
  Administered 2016-12-08: 4 mL via RESPIRATORY_TRACT
  Filled 2016-12-08: qty 4

## 2016-12-08 MED ORDER — ESMOLOL HCL-SODIUM CHLORIDE 2000 MG/100ML IV SOLN
25.0000 ug/kg/min | INTRAVENOUS | Status: DC
  Filled 2016-12-08: qty 100

## 2016-12-08 MED ORDER — POTASSIUM CHLORIDE 20 MEQ/15ML (10%) PO SOLN
40.0000 meq | Freq: Once | ORAL | Status: AC
  Administered 2016-12-08: 40 meq
  Filled 2016-12-08: qty 30

## 2016-12-08 NOTE — Progress Notes (Signed)
Upon intial assessment, patient's HR to the 150s in afib and vent alarming regulation pressure limited. Attempted to suction patient and got minimal secretions. Fentanyl and Versed PRNs also administered. Tyson Alias made aware, esmolol gtt ordered for HR. Respiratory made aware and came to bedside. Patient not pulling volumes on vent, per respiratory. Attempted to bag patient, but patient was very difficult to bag. Tyson Alias, MD called and Jillene Bucks, MD came to bedside. Jillene Bucks, MD performed an emergent bronch (see MD note). After procedure, patient's HR came down and patient more synchronous with ventilator. No need to start esmolol gtt at this time.

## 2016-12-08 NOTE — Care Management Note (Signed)
Case Management Note  Patient Details  Name: Jacob Pittman MRN: [id number] Date of Birth: 07-17-1945  Subjective/Objective:   Pt admitted with abd pain - pt is now s/p gram patch repair of pyloric ulcer.  Pt remains intubated.  CM will continue to follow            Action/Plan:     Expected Discharge Date:   (UNKNOWN)               Expected Discharge Plan:  Home/Self Care  In-House Referral:     Discharge planning Services  CM Consult  Post Acute Care Choice:    Choice offered to:     DME Arranged:    DME Agency:     HH Arranged:    HH Agency:     Status of Service:  In process, will continue to follow  If discussed at Long Length of Stay Meetings, dates discussed:    Additional Comments: 12/08/2016 Pt remains intubated.  CM contacted daughter via phone - per daughter pt was completely independent PTA.  Daughter is planning on being with pt at discharge.  Pt will need PT/OT eval post extubation.  CM will continue to follow for discharge needs Cherylann Parr, RN 12/08/2016, 2:41 PM

## 2016-12-08 NOTE — Procedures (Signed)
Bronchoscopy Procedure Note Jacob Pittman [id number] 1946-02-27  Procedure: Bronchoscopy Indications: difficulty ventilating intubated patient, PEAK pressures 60s  Procedure Details Consent: Unable to obtain consent because of emergent medical necessity. Time Out: Verified patient identification, verified procedure, site/side was marked, verified correct patient position, special equipment/implants available, medications/allergies/relevent history reviewed, required imaging and test results available.  Performed  In preparation for procedure, patient was given 100% FiO2 and bronchoscope lubricated. Sedation: previously sedated  Airway entered and the following bronchi were examined: Bronchi.   Procedures performed: Brushings performed Bronchoscope removed.  , Patient placed back on 100% FiO2 at conclusion of procedure.    Evaluation Hemodynamic Status: BP stable throughout; O2 sats: stable throughout Patient's Current Condition: stable Specimens:  None Complications: No apparent complications Patient did tolerate procedure well.  Patient unable to ventilate 2/2 hi peak pressures, diminished lung sounds on left.  Sats 99%.  Decision for emergent bronch to evalaute for ETT placement and possibly mucous plugging.  Risks of being unable to ventilate felt greater than risks of bleeding due to heprain gtt.  ETT was significantly impacted w/tenacious thick white purulent mucous plugs which werer lav aged and aspirated.  ETT placemnt was verified as 3cm procximal to carina.  After removing secretions from ETT the peak pressures were 35.  At that time CXR arrived.  CXR reviewed which showed left base infiltrate/atelectasis otherwise no PTX or dense infiltrate.  Since ventialtion improved and cxr reassuring decision made to not further inspect airways given heparin gtt.  Plan to decrease tidal volume to 55ml/kg/ibw, increase RR, mucomyst nebs.  Caro Laroche 12/08/2016

## 2016-12-08 NOTE — Progress Notes (Signed)
[facility]  Subjective  - awake on vent   Objective (!) 104/53 (!) 108 98.6 F (37 C) (Oral) (!) 34 99%  Intake/Output Summary (Last 24 hours) at 12/08/16 1045 Last data filed at 12/08/16 1000  Gross per 24 hour  Intake          1713.89 ml  Output             4140 ml  Net         -2426.11 ml   Right hand warm, right arm incision healing  Assessment/Planning: Will recheck next week, leave staples in for now  Fabienne Bruns 12/08/2016 10:45 AM --  Laboratory Lab Results:  Recent Labs  12/07/16 0545 12/08/16 0419  WBC 11.0* 9.0  HGB 9.6* 9.8*  HCT 28.9* 29.8*  PLT 345 381   BMET  Recent Labs  12/07/16 1800 12/08/16 0419  NA 144 146*  K 3.4* 3.2*  CL 115* 114*  CO2 22 23  GLUCOSE 143* 111*  BUN 34* 38*  CREATININE 1.00 1.06  CALCIUM 7.9* 8.0*    COAG Lab Results  Component Value Date   INR 1.35 11/30/2016   INR 1.23 11/26/2016   INR 1.1 (H) 04/03/2013   No results found for: PTT

## 2016-12-08 NOTE — Progress Notes (Signed)
eLink Physician-Brief Progress Note Patient Name: Lorik Boyea DOB: August 03, 1945 MRN: [id number]   Date of Service  12/08/2016  HPI/Events of Note  Bed required for specialty care in 2H Send to 2 mw icu  eICU Interventions       Intervention Category Minor Interventions: Communication with other healthcare providers and/or family  Nelda Bucks. 12/08/2016, 4:48 PM

## 2016-12-08 NOTE — Progress Notes (Signed)
ANTICOAGULATION CONSULT NOTE - Follow-Up Consult  Pharmacy Consult for Heparin Indication: atrial fibrillation, ischemic limb  No Known Allergies  Patient Measurements: Height: 5\' 8"  (172.7 cm) Weight: 208 lb 8.9 oz (94.6 kg) IBW/kg (Calculated) : 68.4 Heparin Dosing Weight: 89.6 kg  Vital Signs: Temp: 98.6 F (37 C) (08/21 0913) Temp Source: Oral (08/21 0913) BP: 107/48 (08/21 0800) Pulse Rate: 81 (08/21 0800)  Labs:  Recent Labs  12/05/16 1024  12/05/16 1630 12/05/16 2206  12/06/16 0400 12/07/16 0545 12/07/16 1800 12/08/16 0419 12/08/16 0420  HGB  --   --   --   --   < > 10.1* 9.6*  --  9.8*  --   HCT  --   --   --   --   --  30.6* 28.9*  --  29.8*  --   PLT  --   --   --   --   --  330 345  --  381  --   HEPARINUNFRC  --   --   --   --   --  0.43 0.41  --   --  0.37  CREATININE  --   < > 1.30*  --   --  1.19 1.00 1.00 1.06  --   TROPONINI 0.08*  --  0.11* 0.10*  --   --   --   --   --   --   < > = values in this interval not displayed.  Estimated Creatinine Clearance: 69.3 mL/min (by C-G formula based on SCr of 1.06 mg/dL).   Medical History: Past Medical History:  Diagnosis Date  . ANXIETY 02/04/2009  . CHF (congestive heart failure) (HCC)   . Chronic systolic CHF (congestive heart failure) (HCC)   . DIABETES MELLITUS, TYPE II 11/07/2008  . 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 HIP PAIN, RIGHT 11/06/2009  . HYPERLIPIDEMIA 11/07/2008  . HYPERTENSION 10/08/2008  . Nonischemic cardiomyopathy [facility])    s/p ICD implantation 03-2013 by Dr 04-2013  . OSTEOARTHRITIS, KNEE, RIGHT 10/08/2008  . Peripheral arterial disease (HCC)     Medications:  Scheduled:  . aspirin  81 mg Per Tube Daily  . chlorhexidine gluconate (MEDLINE KIT)  15 mL Mouth Rinse BID  . chlorhexidine gluconate (MEDLINE KIT)  15 mL Mouth Rinse BID  . Chlorhexidine Gluconate Cloth  6 each Topical Daily  . clarithromycin  500 mg Per Tube Q12H  . feeding supplement (PRO-STAT SUGAR FREE 64)  60  mL Per Tube TID  . feeding supplement (VITAL HIGH PROTEIN)  1,000 mL Per Tube Q24H  . furosemide  20 mg Intravenous Q6H  . insulin aspart  0-20 Units Subcutaneous Q4H  . ipratropium-albuterol  3 mL Nebulization Q6H  . mouth rinse  15 mL Mouth Rinse QID  . mouth rinse  15 mL Mouth Rinse 10 times per day  . metroNIDAZOLE  500 mg Per Tube Q8H  . pantoprazole (PROTONIX) IV  40 mg Intravenous Q12H  . sodium chloride flush  10-40 mL Intracatheter Q12H   Infusions:  . sodium chloride 250 mL (12/06/16 1800)  . sodium chloride 500 mL (12/03/16 0126)  . heparin 1,500 Units/hr (12/08/16 0417)  . norepinephrine (LEVOPHED) Adult infusion Stopped (12/08/16 0930)  . piperacillin-tazobactam (ZOSYN)  IV Stopped (12/08/16 0756)   PRN: sodium chloride, acetaminophen (TYLENOL) oral liquid 160 mg/5 mL, artificial tears, fentaNYL (SUBLIMAZE) injection, midazolam, ondansetron **OR** ondansetron (ZOFRAN) IV, sodium chloride flush  Assessment: 71 yo male admitted 8/9 with gastric ulcer perforation  and peritonitis. Underwent emergent laparotomy with omental patch repair. Post op course complicated by multiorgan failure requiring intubation and pressors.   On 8/13, Pharmacy consulted to dose IV heparin for afib and presumed ischemic right upper extremity. Heparin held 8/13 secondary to bleeding at incision site, resumed on 8/16 after repeat CT negative for acute infarct and pt had cool extremity.   Heparin currently therapeutic at 0.37, CBC stable.  Goal of Therapy:  Heparin level 0.3-0.5 - will use lower end of therapeutic goal Monitor platelets by anticoagulation protocol: Yes   Plan:  -Continue heparin at 1500 units/hr -Monitor heparin level, CBC, S/Sx bleeding daily  Arrie Senate, PharmD PGY-2 Cardiology Pharmacy Resident Pager: [phone] 12/08/2016

## 2016-12-08 NOTE — Progress Notes (Signed)
Report given to [facility] on 69M. Vikki Ports, pt daughter called and made aware pt moving to 69M 16.

## 2016-12-08 NOTE — Progress Notes (Signed)
eLink Physician-Brief Progress Note Patient Name: Jacob Pittman DOB: December 29, 1945 MRN: [id number]   Date of Service  12/08/2016  HPI/Events of Note  Fib rvr 150   eICU Interventions  Has been on lasix, get cvp Takes BB at home Add esmolol      Intervention Category Major Interventions: Arrhythmia - evaluation and management  Nelda Bucks. 12/08/2016, 7:38 PM

## 2016-12-08 NOTE — Progress Notes (Signed)
This encounter was created in error - please disregard.

## 2016-12-08 NOTE — Progress Notes (Signed)
eLink Physician-Brief Progress Note Patient Name: Jacob Pittman DOB: 1946-01-14 MRN: [id number]   Date of Service  12/08/2016  HPI/Events of Note  Called as difficult to bag sats okay HR tachy sys okay  Poor air moveemnt per RT  eICU Interventions  Stat pcxr pccm doc called to assess at bedide Consider fast bronch Ensure no ptxo n pcxr     Intervention Category Major Interventions: Hypoxemia - evaluation and management  Nelda Bucks. 12/08/2016, 7:47 PM

## 2016-12-08 NOTE — Progress Notes (Signed)
PULMONARY / CRITICAL CARE MEDICINE   Name: Jacob Pittman MRN: [id number] DOB: [birth year]    ADMISSION DATE:  11/26/2016   CONSULTATION DATE:  11/27/2016  REFERRING MD:  Ezzard Standing  CHIEF COMPLAINT:  Abdominal Pain  HISTORY OF PRESENT ILLNESS:  71 y.o. man with ischemic cardiomyopathy, diabetic hypertensive in poor health, admitted 8/9 with gastric ulcer perforation and peritonitis. Underwent emergent laparotomy with omental patch repair He was in [place] shock requiring epinephrine and Levophed drip. Course c/b acute ischemia of R arm requiring tx from [facility] to [facility] and urgent OR embolectomy, also c/b slow vent wean.   SUBJECTIVE:   No acute events overnight. Diuresed 2L yesterday. Levo close to off.   VITAL SIGNS: BP (!) 107/48   Pulse 81   Temp 97.8 F (36.6 C) (Oral)   Resp (!) 21   Ht 5\' 8"  (1.727 m)   Wt 94.6 kg (208 lb 8.9 oz)   SpO2 99%   BMI 31.71 kg/m   HEMODYNAMICS:    VENTILATOR SETTINGS: Vent Mode: PRVC FiO2 (%):  [30 %] 30 % Set Rate:  [12 bmp-25 bmp] 12 bmp Vt Set:  [550 mL] 550 mL PEEP:  [5 cmH20] 5 cmH20 Pressure Support:  [10 cmH20] 10 cmH20 Plateau Pressure:  [17 cmH20-34 cmH20] 34 cmH20  INTAKE / OUTPUT:  Intake/Output Summary (Last 24 hours) at 12/08/16 4098 Last data filed at 12/08/16 0800  Gross per 24 hour  Intake          1796.87 ml  Output             4140 ml  Net         -2343.13 ml   PHYSICAL EXAMINATION:.  General:  Elderly male in NAD on vent Neuro: easily awakens to verbal. Nods appropriately.  HEENT:  [place]/AT, No JVD noted, PERRL Cardiovascular:  IRIR no MRG Lungs:  Coarse Abdomen:  Soft, non-distended, non-tender Musculoskeletal:  No acute deformity or ROM limitation Skin:  Intact, MMM   LABS:  BMET  Recent Labs Lab 12/07/16 0545 12/07/16 1800 12/08/16 0419  NA 143 144 146*  K 3.2* 3.4* 3.2*  CL 115* 115* 114*  CO2 23 22 23   BUN 35* 34* 38*  CREATININE 1.00 1.00 1.06  GLUCOSE 133* 143* 111*    Electrolytes  Recent  Labs Lab 12/06/16 0400 12/07/16 0545 12/07/16 1800 12/08/16 0419  CALCIUM 7.5* 7.7* 7.9* 8.0*  MG 1.8 1.8  --  1.7  PHOS 2.1*  --  2.2* 3.1    CBC  Recent Labs Lab 12/06/16 0400 12/07/16 0545 12/08/16 0419  WBC 16.2* 11.0* 9.0  HGB 10.1* 9.6* 9.8*  HCT 30.6* 28.9* 29.8*  PLT 330 345 381    Coag's No results for input(s): APTT, INR in the last 168 hours.  Sepsis Markers  Recent Labs Lab 12/02/16 0400 12/03/16 0430  PROCALCITON 30.83 16.20    ABG No results for input(s): PHART, PCO2ART, PO2ART in the last 168 hours.  Liver Enzymes  Recent Labs Lab 12/03/16 0430 12/04/16 0448 12/06/16 0400 12/07/16 1800 12/08/16 0419  AST 139* 92* 45*  --   --   ALT 121* 105* 60  --   --   ALKPHOS 71 94 64  --   --   BILITOT 2.3* 2.4* 1.4*  --   --   ALBUMIN 1.8* 1.9* 1.7* 1.7* 1.8*    Cardiac Enzymes  Recent Labs Lab 12/05/16 1024 12/05/16 1630 12/05/16 2206  TROPONINI 0.08* 0.11* 0.10*    Glucose  Recent Labs Lab 12/07/16 0823 12/07/16 1155 12/07/16 1653 12/07/16 2108 12/07/16 2333 12/08/16 0328  GLUCAP 113* 161* 118* 153* 151* 99    IMAGING/STUDIES: CTA CHEST/ABD/PELVIS 8/10: IMPRESSION: 1. Bowel perforation, with a large amount of free air and free fluid noted throughout the abdomen and pelvis. Tiny focus of air along the pylorus and proximal duodenum raises question for the site of perforation. This was better characterized on subsequent surgery. 2. Vague mild soft tissue inflammation about the proximal ileum at the left mid abdomen. 3. No evidence of aortic dissection. No evidence of aneurysmal dilatation. Scattered aortic atherosclerosis. 4. No evidence of central pulmonary embolus. 5. Vague soft tissue inflammation about the bladder may be reactive in nature, or could reflect mild cystitis. 6. Cardiomegaly.  Scattered coronary artery calcification noted. 7. Trace right-sided pleural fluid. Patchy bibasilar airspace opacities likely reflect  atelectasis. 8. Scattered diverticulosis along the ascending colon, without evidence of diverticulitis. 9. Borderline enlarged prostate. 10. Small to moderate bilateral inguinal hernias, containing only fat. TTE 8/12:  LV moderately dilated with EF 15% & diffuse hypokinesis. There is akinesis of the inferolateral and inferior myocardium. Study insufficient to assess diastolic function. LA & RA severely dilated. RV moderately dilated with moderately reduced systolic function. Pacer wires noted. Mild aortic regurgitation without stenosis. Aortic root normal in size. Moderate mitral regurgitation without stenosis. Trivial pulmonic regurgitation without stenosis. Moderate tricuspid regurgitation. No pericardial effusion. CT HEAD W/O 8/14:  Decreased attenuation involving the right dentate nucleus in the right cerebellum and immediately adjacent cerebellar parenchyma, concerning for recent and potentially acute infarct in this area. Elsewhere there is atrophy with moderate periventricular small vessel disease. There is no intracranial mass, hemorrhage, or extra-axial fluid collection. Multiple foci of arterial vascular calcification noted. Areas of paranasal sinus disease. Areas of right-sided mastoid disease. RUQ U/S 8/15: IMPRESSION: 1. No acute abnormality seen at the right upper quadrant. 2. Suggestion of fatty infiltration within the liver. 3. Tiny stones and mild sludge within the gallbladder. No definite evidence for obstruction or cholecystitis. CT HEAD W/O 8/16: IMPRESSION: 1. No acute finding. No acute infarct detected in the cerebellum as questioned previously. 2. Small remote bilateral cerebellar infarcts. Chronic small vessel ischemia in the cerebral white matter.  CXR 12/06/16- basal atelectasis, ETT and CVL in place. Images reviewed.   MICROBIOLOGY: Blood Culture x2 8/9 >>>NEG Urine Culture 8/9:  E coli Abdominal Wound Culture 8/10:  Normal Skin Flora  MRSA PCR 8/10:  Negative  Blood  Cultures x2 8/10:  Negative  H. Pylori IgG:  5.00 (positive) Acute Hepatitis Panel 8/15:  Negative   ANTIBIOTICS: Vancomycin 8/10 (x1 dose) Diflucan 8/10 - 8/15 Zosyn 8/10 >>> Flagyl 8/15 >>> Biaxin 8/15 >>>  SIGNIFICANT EVENTS: 08/10 - Admit w/ pneumoperitoneum >> taken to OR emergently for omental patch of gastric ulcer perforation 08/11 - High grade fever to 105F. Stress-dosed steroid started. 08/12 - Stress-dosed steroids tapered to q12hr 08/13 - Transfer to [facility] for arterial embolectomy 08/14 - Off pressors. Stopped Steroids. Altered mentation that is worse post-op & without any drips. Heparin drip stopped w/ bleeding from op site. 08/15 - More awake & following commands. Started on triple therapy for H pylori 08/16 - No evidence of CVA on repeat CT. Heparin drip restarted for possible leg ishcemia. 08/17 - Advancing tube feedings while weaning TPN. Worsening shock. 8/20 - Tol TF, still on Levo weaning down. Fail SBT. Waking up fine.  LINES/TUBES: R BRACHIAL ART LINE 8/10 (out) OETT 8/10 >>> R IJ  DL CVL 6/96 >>> ABD JP Drain 8/10 >>> Foley >>> OGT >>>  ASSESSMENT / PLAN: 71 y.o. male with multiple medical problems. S/p e lap for perf ulcer. Post op course complicated by ischemic rt hand from a line s/p thrombectomy by vascular surgery.  PULMONARY A: Acute hypoxic respiratory failure: Multifactorial. Large component likely due to volume overload.   P: Continue daily SBTs Duonebs for wheezing Diuresis as able  CARDIOVASCULAR A: Right Upper Extremity Arterial Clot:  Secondary to arterial line. S/P embolectomy w/ vein patch 8/13.  Shock Biventricular Heart Failure Elevated Troponin I:  Likely demand given acute illness.  Flat trend Paroxysmal Atrial Fibrillation H/O NICM:  S/P ICD 2014 EF 15%, Essential Hypertension, Hyperlipidemia, PAD  P: ICU monitoring Levo down to  Continue heparin gtt, aspirin  RENAL A: Acute renal failure: Resolving.   Hypokalemia: Replaced.  P: 9L pos Replete K Monitor urine output and Cr Continue gentle diuresis today  GASTROINTESTINAL A: Perforated Pyloric Channel Ulcer:  S/P Repair 8/10 by Dr. Ezzard Standing. H. pylori IgG elevated. Ileus:  Post-op. Improving. Severe Protein-Calorie Malnutrition Transaminitis:  Improving. Suspect hepatotoxicity from Diflucan.   P: Tolerating tube feeds Surgery following Protonix for SUP  HEME A: Leukocytosis: Improving 22.6 > 11 Thrombocytopenia:  Resolved. Likely due to splenic sequestration & consumption. P: Follow CBC Heparin infusion for AF  INFECTIOUS DISEASE A: Septic Shock improving Peritonitis:  Secondary to perforated ulcer. E coli UTI Helicobacter pylori infection C diff negative  P: Continuing empiric Zosyn, Biaxin, & Flagyl 14 day course for H. Pylori  ENDOCRINE A: Diabetes Mellitus:  Glucose controlled. Hypoglycemia:  On 8/11.   P: SSI coverage  NEUROLOGIC A: Sedation on Ventilator Acute Encephalopathy: Significantly improved. S/P high dose IV thiamine. Possible Cerebellar CVA:  Not seen on repeat CT. Unable to get MRI w/ implanted device. Neurology previously consulted. H/O anxiety, right hip pain & osteoarthritis, EtOH Use  P: Desired RASS:  0 to -1 Fentanyl, versed PRN  Family Update:  No family present   Joneen Roach, AGACNP-BC [facility] Pulmonology/Critical Care Pager [phone] or [phone]  12/08/2016 8:32 AM

## 2016-12-08 NOTE — Progress Notes (Signed)
eLink Physician-Brief Progress Note Patient Name: Jacob Pittman DOB: Jul 22, 1945 MRN: [id number]   Date of Service  12/08/2016  HPI/Events of Note  Patient intubated and ventilated - RR = 40.  eICU Interventions  Will order: 1. Fentanyl IV infusion. Titrate to RASS = 0.      Intervention Category Minor Interventions: Agitation / anxiety - evaluation and management  Sommer,Steven Dennard Nip 12/08/2016, 11:58 PM

## 2016-12-08 NOTE — Progress Notes (Signed)
8 Days Post-Op   Subjective/Chief Complaint: tol tube feeds, awake intubated, failed extubation yesterday   Objective: Vital signs in last 24 hours: Temp:  [97.8 F (36.6 C)-98.8 F (37.1 C)] 97.8 F (36.6 C) (08/21 0315) Pulse Rate:  [81-136] 81 (08/21 0800) Resp:  [19-32] 21 (08/21 0800) BP: (78-132)/(48-109) 107/48 (08/21 0800) SpO2:  [95 %-100 %] 99 % (08/21 0800) FiO2 (%):  [30 %] 30 % (08/21 0600) Weight:  [94.6 kg (208 lb 8.9 oz)] 94.6 kg (208 lb 8.9 oz) (08/21 0500) Last BM Date: 12/07/16  Intake/Output from previous day: 08/20 0701 - 08/21 0700 In: 1705.9 [I.V.:665.9; NG/GT:990; IV Piggyback:50] Out: 4140 [Urine:4125; Drains:15] Intake/Output this shift: No intake/output data recorded.  Resp: coarse bilateral breath sounds Cardio: tachy rr GI: approp tender some bs present drain serous wound open with some necrosis but no dehiscence  Lab Results:   Recent Labs  12/07/16 0545 12/08/16 0419  WBC 11.0* 9.0  HGB 9.6* 9.8*  HCT 28.9* 29.8*  PLT 345 381   BMET  Recent Labs  12/07/16 1800 12/08/16 0419  NA 144 146*  K 3.4* 3.2*  CL 115* 114*  CO2 22 23  GLUCOSE 143* 111*  BUN 34* 38*  CREATININE 1.00 1.06  CALCIUM 7.9* 8.0*   PT/INR No results for input(s): LABPROT, INR in the last 72 hours. ABG No results for input(s): PHART, HCO3 in the last 72 hours.  Invalid input(s): PCO2, PO2  Studies/Results: No results found.  Anti-infectives: Anti-infectives    Start     Dose/Rate Route Frequency Ordered Stop   12/04/16 2200  clarithromycin (BIAXIN) tablet 500 mg     500 mg Per Tube Every 12 hours 12/04/16 1107     12/04/16 2000  piperacillin-tazobactam (ZOSYN) IVPB 3.375 g     3.375 g 12.5 mL/hr over 240 Minutes Intravenous Every 8 hours 12/04/16 1339     12/04/16 1600  metroNIDAZOLE (FLAGYL) tablet 500 mg     500 mg Per Tube Every 8 hours 12/04/16 1107     12/02/16 1100  clarithromycin (BIAXIN) 250 MG/5ML suspension 500 mg  Status:   Discontinued     500 mg Per Tube Every 12 hours 12/02/16 1012 12/04/16 1107   12/02/16 1045  metroNIDAZOLE (FLAGYL) 50 mg/ml oral suspension 500 mg  Status:  Discontinued     500 mg Per Tube 3 times daily 12/02/16 1036 12/04/16 1107   12/02/16 1015  metroNIDAZOLE (FLAGYL) 50 mg/ml oral suspension 250 mg  Status:  Discontinued     250 mg Per Tube 4 times daily 12/02/16 1011 12/02/16 1036   11/28/16 0800  fluconazole (DIFLUCAN) IVPB 200 mg  Status:  Discontinued     200 mg 100 mL/hr over 60 Minutes Intravenous Every 24 hours 11/27/16 0624 12/02/16 1018   11/27/16 0630  fluconazole (DIFLUCAN) IVPB 400 mg     400 mg 100 mL/hr over 120 Minutes Intravenous  Once 11/27/16 0622 11/27/16 0841   11/27/16 0600  piperacillin-tazobactam (ZOSYN) IVPB 3.375 g  Status:  Discontinued     3.375 g 12.5 mL/hr over 240 Minutes Intravenous Every 8 hours 11/27/16 0534 12/04/16 1339   11/27/16 0515  piperacillin-tazobactam (ZOSYN) IVPB 3.375 g  Status:  Discontinued     3.375 g 100 mL/hr over 30 Minutes Intravenous  Once 11/27/16 0510 11/27/16 0518   11/27/16 0030  piperacillin-tazobactam (ZOSYN) IVPB 3.375 g     3.375 g 100 mL/hr over 30 Minutes Intravenous  Once 11/27/16 0016 11/27/16 0755  11/27/16 0030  vancomycin (VANCOCIN) IVPB 1000 mg/200 mL premix     1,000 mg 200 mL/hr over 60 Minutes Intravenous  Once 11/27/16 0016 11/27/16 0756      Assessment/Plan: Perforated pyloric ulcer  POD#11S/P EXPLORATORY LAPAROTOMY, GRAM PATCH REPAIR8/10/18 Dr. Ovidio Kin - tube feeds atgoal rate, continue - continue BID wet to dry dressing changes, may place vac at some point, this wound is certainly at risk and needs to be monitoried - H. pylori IgG elevated. continue IV Protonix  -will dc drain today  Malnutrition:continue tube feeds, once extubated and cleared to swallow can start diet and ng can be removed. ID: really does not need abx from intraabdominal process at this point, wbc continues to decrease  and is normal today, afebrile, dont think needs ct a/p   [facility] 12/08/2016

## 2016-12-09 ENCOUNTER — Telehealth: Payer: Self-pay | Admitting: Cardiology

## 2016-12-09 ENCOUNTER — Inpatient Hospital Stay (HOSPITAL_COMMUNITY): Payer: Medicare PPO

## 2016-12-09 LAB — GLUCOSE, CAPILLARY
Glucose-Capillary: 108 mg/dL — ABNORMAL HIGH (ref 65–99)
Glucose-Capillary: 110 mg/dL — ABNORMAL HIGH (ref 65–99)
Glucose-Capillary: 120 mg/dL — ABNORMAL HIGH (ref 65–99)
Glucose-Capillary: 124 mg/dL — ABNORMAL HIGH (ref 65–99)
Glucose-Capillary: 124 mg/dL — ABNORMAL HIGH (ref 65–99)
Glucose-Capillary: 137 mg/dL — ABNORMAL HIGH (ref 65–99)

## 2016-12-09 LAB — RENAL FUNCTION PANEL
Albumin: 1.8 g/dL — ABNORMAL LOW (ref 3.5–5.0)
Anion gap: 9 (ref 5–15)
BUN: 41 mg/dL — ABNORMAL HIGH (ref 6–20)
CO2: 25 mmol/L (ref 22–32)
CREATININE: 1.15 mg/dL (ref 0.61–1.24)
Calcium: 8.1 mg/dL — ABNORMAL LOW (ref 8.9–10.3)
Chloride: 115 mmol/L — ABNORMAL HIGH (ref 101–111)
Glucose, Bld: 99 mg/dL (ref 65–99)
Phosphorus: 3.5 mg/dL (ref 2.5–4.6)
Potassium: 3.4 mmol/L — ABNORMAL LOW (ref 3.5–5.1)
SODIUM: 149 mmol/L — AB (ref 135–145)

## 2016-12-09 LAB — MAGNESIUM
Magnesium: 1.8 mg/dL (ref 1.7–2.4)
Magnesium: 2.1 mg/dL (ref 1.7–2.4)

## 2016-12-09 LAB — BASIC METABOLIC PANEL
Anion gap: 7 (ref 5–15)
Anion gap: 6 (ref 5–15)
BUN: 39 mg/dL — ABNORMAL HIGH (ref 6–20)
BUN: 41 mg/dL — AB (ref 6–20)
CALCIUM: 8.2 mg/dL — AB (ref 8.9–10.3)
CHLORIDE: 117 mmol/L — AB (ref 101–111)
CO2: 26 mmol/L (ref 22–32)
CO2: 28 mmol/L (ref 22–32)
Calcium: 8.3 mg/dL — ABNORMAL LOW (ref 8.9–10.3)
Chloride: 115 mmol/L — ABNORMAL HIGH (ref 101–111)
Creatinine, Ser: 0.98 mg/dL (ref 0.61–1.24)
Creatinine, Ser: 1.09 mg/dL (ref 0.61–1.24)
GFR calc Af Amer: >60 mL/min (ref >60)
GFR calc non Af Amer: >60 mL/min (ref >60)
GLUCOSE: 128 mg/dL — AB (ref 65–99)
Glucose, Bld: 157 mg/dL — ABNORMAL HIGH (ref 65–99)
POTASSIUM: 3.9 mmol/L (ref 3.5–5.1)
POTASSIUM: 3.8 mmol/L (ref 3.5–5.1)
SODIUM: 150 mmol/L — AB (ref 135–145)
Sodium: 149 mmol/L — ABNORMAL HIGH (ref 135–145)

## 2016-12-09 LAB — CBC WITH DIFFERENTIAL/PLATELET
Basophils Absolute: 0 10*3/uL (ref 0.0–0.1)
Basophils Relative: 0 %
EOS PCT: 0 %
Eosinophils Absolute: 0 10*3/uL (ref 0.0–0.7)
HEMATOCRIT: 29.6 % — AB (ref 39.0–52.0)
HEMOGLOBIN: 9.5 g/dL — AB (ref 13.0–17.0)
LYMPHS ABS: 1 10*3/uL (ref 0.7–4.0)
Lymphocytes Relative: 12 %
MCH: 33.6 pg (ref 26.0–34.0)
MCHC: 32.1 g/dL (ref 30.0–36.0)
MCV: 104.6 fL — ABNORMAL HIGH (ref 78.0–100.0)
MONOS PCT: 10 %
Monocytes Absolute: 0.8 10*3/uL (ref 0.1–1.0)
NEUTROS PCT: 78 %
Neutro Abs: 6.2 10*3/uL (ref 1.7–7.7)
Platelets: 419 10*3/uL — ABNORMAL HIGH (ref 150–400)
RBC: 2.83 MIL/uL — AB (ref 4.22–5.81)
RDW: 16.2 % — ABNORMAL HIGH (ref 11.5–15.5)
WBC: 8 10*3/uL (ref 4.0–10.5)

## 2016-12-09 LAB — HEPARIN LEVEL (UNFRACTIONATED): HEPARIN UNFRACTIONATED: 0.31 [IU]/mL (ref 0.30–0.70)

## 2016-12-09 MED ORDER — AMOXICILLIN 250 MG/5ML PO SUSR
1000.0000 mg | Freq: Two times a day (BID) | ORAL | Status: DC
  Filled 2016-12-09: qty 20

## 2016-12-09 MED ORDER — FUROSEMIDE 10 MG/ML IJ SOLN
40.0000 mg | Freq: Four times a day (QID) | INTRAMUSCULAR | Status: DC
  Administered 2016-12-09 (×2): 40 mg via INTRAVENOUS
  Filled 2016-12-09 (×2): qty 4

## 2016-12-09 MED ORDER — AMOXICILLIN 250 MG/5ML PO SUSR
1000.0000 mg | Freq: Two times a day (BID) | ORAL | Status: DC
  Administered 2016-12-09: 1000 mg
  Filled 2016-12-09 (×3): qty 20

## 2016-12-09 MED ORDER — POTASSIUM CHLORIDE 10 MEQ/50ML IV SOLN: 10.0000 meq | INTRAVENOUS | Status: DC

## 2016-12-09 MED ORDER — AMOXICILLIN 250 MG/5ML PO SUSR
1000.0000 mg | Freq: Two times a day (BID) | ORAL | Status: DC
  Administered 2016-12-10 – 2016-12-12 (×5): 1000 mg
  Filled 2016-12-09 (×6): qty 20

## 2016-12-09 MED ORDER — POTASSIUM CHLORIDE 20 MEQ/15ML (10%) PO SOLN
40.0000 meq | Freq: Once | ORAL | Status: AC
  Administered 2016-12-09: 40 meq via ORAL
  Filled 2016-12-09: qty 30

## 2016-12-09 MED ORDER — MAGNESIUM SULFATE 2 GM/50ML IV SOLN
2.0000 g | Freq: Once | INTRAVENOUS | Status: AC
  Administered 2016-12-09: 2 g via INTRAVENOUS
  Filled 2016-12-09: qty 50

## 2016-12-09 NOTE — Telephone Encounter (Signed)
Noted.  Anikka Marsan [place] MD, [facility]

## 2016-12-09 NOTE — Progress Notes (Signed)
PULMONARY / CRITICAL CARE MEDICINE   Name: Jacob Pittman MRN: [id number] DOB: 09-20-1945    ADMISSION DATE:  11/26/2016   CONSULTATION DATE:  11/27/2016  REFERRING MD:  Ezzard Standing  CHIEF COMPLAINT:  Abdominal Pain  Brief:   71 year old man with ischemic cardiomyopathy, DM, HTN, PAD, OSA, in overall poor health  Admitted 8/9 with gastric ulcer perforation and peritonitis. Underwent emergent laparotomy with omental patch repair, postoperatively remained in shock requiring epinephrine and Levophed drip. Course c/b acute ischemia of R arm requiring tx from [facility] to [facility] and urgent OR embolectomy, also c/b slow vent wean.   SUBJECTIVE:   Last night went into Afib with RVR and poor air movement with high peak pressures. Underwent Bronchoscopy in which revealed thick white purulent mucous plugs.    VITAL SIGNS: BP 109/62   Pulse (!) 103   Temp (!) 100.6 F (38.1 C)   Resp (!) 28   Ht 5\' 8"  (1.727 m)   Wt 91.8 kg (202 lb 6.1 oz)   SpO2 99%   BMI 30.77 kg/m   HEMODYNAMICS: CVP:  [6 mmHg-8 mmHg] 6 mmHg  VENTILATOR SETTINGS: Vent Mode: PRVC FiO2 (%):  [30 %] 30 % Set Rate:  [12 bmp] 12 bmp Vt Set:  [400 mL-550 mL] 400 mL PEEP:  [5 cmH20] 5 cmH20 Pressure Support:  [5 cmH20] 5 cmH20 Plateau Pressure:  [20 cmH20-24 cmH20] 24 cmH20  INTAKE / OUTPUT:  Intake/Output Summary (Last 24 hours) at 12/09/16 1056 Last data filed at 12/09/16 1000  Gross per 24 hour  Intake          1910.79 ml  Output             3630 ml  Net         -1719.21 ml   PHYSICAL EXAMINATION:.  General:  Elderly male, no distress  Neuro: Alert, follows commands, moves extermites  HEENT:  [place]/AT, No JVD noted, PERRL Cardiovascular:  IRIR no MRG Lungs:  Diminished breath sounds, non-labored  Abdomen:  Soft, non-distended, non-tender Musculoskeletal:  Swelling and redness to left upper extremity  Skin:  Midline ABD dressing noted. Clean, dry    LABS:  BMET  Recent Labs Lab 12/07/16 1800 12/08/16 0419  12/09/16 0358  NA 144 146* 149*  K 3.4* 3.2* 3.4*  CL 115* 114* 115*  CO2 22 23 25   BUN 34* 38* 41*  CREATININE 1.00 1.06 1.15  GLUCOSE 143* 111* 99    Electrolytes  Recent Labs Lab 12/07/16 0545 12/07/16 1800 12/08/16 0419 12/09/16 0358  CALCIUM 7.7* 7.9* 8.0* 8.1*  MG 1.8  --  1.7 1.8  PHOS  --  2.2* 3.1 3.5    CBC  Recent Labs Lab 12/07/16 0545 12/08/16 0419 12/09/16 0358  WBC 11.0* 9.0 8.0  HGB 9.6* 9.8* 9.5*  HCT 28.9* 29.8* 29.6*  PLT 345 381 419*    Coag's No results for input(s): APTT, INR in the last 168 hours.  Sepsis Markers  Recent Labs Lab 12/03/16 0430  PROCALCITON 16.20    ABG No results for input(s): PHART, PCO2ART, PO2ART in the last 168 hours.  Liver Enzymes  Recent Labs Lab 12/03/16 0430 12/04/16 0448 12/06/16 0400 12/07/16 1800 12/08/16 0419 12/09/16 0358  AST 139* 92* 45*  --   --   --   ALT 121* 105* 60  --   --   --   ALKPHOS 71 94 64  --   --   --   BILITOT 2.3*  2.4* 1.4*  --   --   --   ALBUMIN 1.8* 1.9* 1.7* 1.7* 1.8* 1.8*    Cardiac Enzymes  Recent Labs Lab 12/05/16 1024 12/05/16 1630 12/05/16 2206  TROPONINI 0.08* 0.11* 0.10*    Glucose  Recent Labs Lab 12/08/16 1213 12/08/16 1642 12/08/16 2039 12/08/16 2359 12/09/16 0313 12/09/16 0853  GLUCAP 144* 118* 116* 164* 110* 108*    IMAGING/STUDIES: CTA CHEST/ABD/PELVIS 8/10: IMPRESSION: 1. Bowel perforation, with a large amount of free air and free fluid noted throughout the abdomen and pelvis. Tiny focus of air along the pylorus and proximal duodenum raises question for the site of perforation. This was better characterized on subsequent surgery. 2. Vague mild soft tissue inflammation about the proximal ileum at the left mid abdomen. 3. No evidence of aortic dissection. No evidence of aneurysmal dilatation. Scattered aortic atherosclerosis. 4. No evidence of central pulmonary embolus. 5. Vague soft tissue inflammation about the bladder may be  reactive in nature, or could reflect mild cystitis. 6. Cardiomegaly.  Scattered coronary artery calcification noted. 7. Trace right-sided pleural fluid. Patchy bibasilar airspace opacities likely reflect atelectasis. 8. Scattered diverticulosis along the ascending colon, without evidence of diverticulitis. 9. Borderline enlarged prostate. 10. Small to moderate bilateral inguinal hernias, containing only fat. TTE 8/12:  LV moderately dilated with EF 15% & diffuse hypokinesis. There is akinesis of the inferolateral and inferior myocardium. Study insufficient to assess diastolic function. LA & RA severely dilated. RV moderately dilated with moderately reduced systolic function. Pacer wires noted. Mild aortic regurgitation without stenosis. Aortic root normal in size. Moderate mitral regurgitation without stenosis. Trivial pulmonic regurgitation without stenosis. Moderate tricuspid regurgitation. No pericardial effusion. CT HEAD W/O 8/14:  Decreased attenuation involving the right dentate nucleus in the right cerebellum and immediately adjacent cerebellar parenchyma, concerning for recent and potentially acute infarct in this area. Elsewhere there is atrophy with moderate periventricular small vessel disease. There is no intracranial mass, hemorrhage, or extra-axial fluid collection. Multiple foci of arterial vascular calcification noted. Areas of paranasal sinus disease. Areas of right-sided mastoid disease. RUQ U/S 8/15: IMPRESSION: 1. No acute abnormality seen at the right upper quadrant. 2. Suggestion of fatty infiltration within the liver. 3. Tiny stones and mild sludge within the gallbladder. No definite evidence for obstruction or cholecystitis. CT HEAD W/O 8/16: IMPRESSION: 1. No acute finding. No acute infarct detected in the cerebellum as questioned previously. 2. Small remote bilateral cerebellar infarcts. Chronic small vessel ischemia in the cerebral white matter. CXR 12/06/16- basal  atelectasis, ETT and CVL in place. Images reviewed.  [place] Left Upper 8/22 >>   MICROBIOLOGY: Blood Culture x2 8/9 >>>NEG Urine Culture 8/9:  E coli Abdominal Wound Culture 8/10:  Normal Skin Flora  MRSA PCR 8/10:  Negative  Blood Cultures x2 8/10:  Negative  H. Pylori IgG:  5.00 (positive) Acute Hepatitis Panel 8/15:  Negative   ANTIBIOTICS: Vancomycin 8/10 (x1 dose) Diflucan 8/10 - 8/15 Zosyn 8/10 > 8/21  Flagyl 8/15 > 8/22  Biaxin 8/15 >>  Amoxicillin 8/22 >>   SIGNIFICANT EVENTS: 08/10 - Admit w/ pneumoperitoneum >> taken to OR emergently for omental patch of gastric ulcer perforation 08/11 - High grade fever to 105F. Stress-dosed steroid started. 08/12 - Stress-dosed steroids tapered to q12hr 08/13 - Transfer to [facility] for arterial embolectomy 08/14 - Off pressors. Stopped Steroids. Altered mentation that is worse post-op & without any drips. Heparin drip stopped w/ bleeding from op site. 08/15 - More awake & following commands. Started on  triple therapy for H pylori 08/16 - No evidence of CVA on repeat CT. Heparin drip restarted for possible leg ishcemia. 08/17 - Advancing tube feedings while weaning TPN. Worsening shock. 8/20 - Tol TF, still on Levo weaning down. Fail SBT. Waking up fine.  LINES/TUBES: R BRACHIAL ART LINE 8/10 (out) OETT 8/10 >>  R IJ DL CVL 1/61 >>> ABD JP Drain 8/10 > 8/21  Foley >>> OGT >>>  ASSESSMENT / PLAN: 71 y.o. male with multiple medical problems. S/p e lap for perf ulcer. Post op course complicated by ischemic rt hand from a line s/p thrombectomy by vascular surgery.  PULMONARY A: Acute hypoxic respiratory failure: Multifactorial. Large component likely due to volume overload.  P: Vent Support  Trend CXR  Wean Daily > May need evaluation for tracheostomy given prolonged intubated and continued thick sercretions  Duonebs scheduled  Diuresis as able  CARDIOVASCULAR A: Right Upper Extremity Arterial Clot:  Secondary to arterial line.  S/P embolectomy w/ vein patch 8/13.  Shock Biventricular Heart Failure Elevated Troponin I:  Likely demand given acute illness.  Flat trend Paroxysmal Atrial Fibrillation H/O NICM:  S/P ICD 2014 EF 15%, Essential Hypertension, Hyperlipidemia, PAD P: Cardiac Monitoring  MAP goal >65 (Levophed off since 0930 8/21)  Continue heparin gtt Continue ASA  U/S left upper due to edema   RENAL A: Acute renal failure: Resolving.  Hypokalemia: Replaced. -Positive 7.7 L overall  P: Trend BMP  Replace mag and K now > repeat BMP at 1800  Replace electrolytes as indicated  Strict I & O  Lasix 40 mg q6h x 4 doses   GASTROINTESTINAL A: Perforated Pyloric Channel Ulcer:  S/P Repair 8/10 by Dr. Ezzard Standing. H. pylori IgG elevated. Ileus:  Post-op. Improving. Severe Protein-Calorie Malnutrition Transaminitis:  Improving. Suspect hepatotoxicity from Diflucan.  P: Surgery Following  Tolerating tube feeds PPI  HEME A: Leukocytosis: Improving 22.6 > 11 Thrombocytopenia:  Resolved. Likely due to splenic sequestration & consumption. P: Follow CBC Heparin infusion for AF  INFECTIOUS DISEASE A: Septic Shock improving Peritonitis:  Secondary to perforated ulcer. E coli UTI Helicobacter pylori infection C diff negative P: Trend WBC and Fever Curve  Continuing Biaxin and Amoxicillin until 8/29 for H. Pylori  ENDOCRINE A: Diabetes Mellitus:  Glucose controlled. Hypoglycemia:  On 8/11.  P: SSI coverage Trend Glucose   NEUROLOGIC A: Sedation on Ventilator Acute Encephalopathy: Significantly improved. S/P high dose IV thiamine. Possible Cerebellar CVA:  Not seen on repeat CT. Unable to get MRI w/ implanted device. Neurology previously consulted. H/O anxiety, right hip pain & osteoarthritis, EtOH Use P: Desired RASS:  0 to -1 Wean Fentanyl gtt to achieve RASS Versed PRN  Family Update:  No family present  CC Time: 36 minutes   Jovita Kussmaul, AGACNP-BC [place] [facility]  Pgr: [phone]  PCCM Pgr: [phone]  Attending Note:  71 year old male with extensive medical history presenting with abdominal pain and was found to have a peroration.  Patient was taken to the OR and repaired.  On exam, he is weaning but secretions preclude extubation at this time.  I reviewed CXR myself, ETT is in good position.  Patient has been intubated for 12 days at this point.  Family is discussing trach/peg at this point.  Will continue weaning for now, keep dry as able.  Continue to monitor hemodynamics.  Avoid fluid positive.  PCCM-NP spoke with family they are discussing options at this point and will let patient continue to  wean and will allow family to inform us of their wishes.  The patient is critically ill with multiple organ systems failure and requires high complexity decision making for assessment and support, frequent evaluation and titration of therapies, application of advanced monitoring technologies and extensive interpretation of multiple databases.   Critical Care Time devoted to patient care services described in this note is  35  Minutes. This time reflects time of care of this signee Dr Koren Bound. This critical care time does not reflect procedure time, or teaching time or supervisory time of PA/NP/Med student/Med Resident etc but could involve care discussion time.  Alyson Reedy, M.D. [facility] Pulmonary/Critical Care Medicine. Pager: [phone]. After hours pager: [phone].

## 2016-12-09 NOTE — Telephone Encounter (Signed)
11/26/2016 - present (13 days); [facility]

## 2016-12-09 NOTE — Progress Notes (Signed)
ANTICOAGULATION CONSULT NOTE - Follow-Up Consult  Pharmacy Consult for Heparin Indication: atrial fibrillation, ischemic limb  No Known Allergies  Patient Measurements: Height: 5\' 8"  (172.7 cm) Weight: 202 lb 6.1 oz (91.8 kg) IBW/kg (Calculated) : 68.4 Heparin Dosing Weight: 89.6 kg  Vital Signs: Temp: 100.6 F (38.1 C) (08/21 2300) BP: 117/47 (08/22 0900) Pulse Rate: 91 (08/22 0900)  Labs:  Recent Labs  12/07/16 0545 12/07/16 1800 12/08/16 0419 12/08/16 0420 12/09/16 0358  HGB 9.6*  --  9.8*  --  9.5*  HCT 28.9*  --  29.8*  --  29.6*  PLT 345  --  381  --  419*  HEPARINUNFRC 0.41  --   --  0.37 0.31  CREATININE 1.00 1.00 1.06  --  1.15   Estimated Creatinine Clearance: 63 mL/min (by C-G formula based on SCr of 1.15 mg/dL).  Medical History: Past Medical History:  Diagnosis Date  . ANXIETY 02/04/2009  . CHF (congestive heart failure) (HCC)   . Chronic systolic CHF (congestive heart failure) (HCC)   . DIABETES MELLITUS, TYPE II 11/07/2008  . 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 HIP PAIN, RIGHT 11/06/2009  . HYPERLIPIDEMIA 11/07/2008  . HYPERTENSION 10/08/2008  . Nonischemic cardiomyopathy [facility])    s/p ICD implantation 03-2013 by Dr Ladona Ridgel  . OSTEOARTHRITIS, KNEE, RIGHT 10/08/2008  . Peripheral arterial disease [facility])    Assessment: 71 yo male admitted 8/9 with gastric ulcer perforation and peritonitis. Underwent emergent laparotomy with omental patch repair. Post op course complicated by multiorgan failure requiring intubation and pressors.   On 8/13, Pharmacy consulted to dose IV heparin for afib and presumed ischemic right upper extremity. Heparin held 8/13 secondary to bleeding at incision site, resumed on 8/16 after repeat CT negative for acute infarct and pt had cool extremity.   Heparin currently therapeutic at 0.31 but has been consistently trending down. (0.41>0.37>0.31). CBC stable, no bleeding noted. I am anticipating that he will be subtherapeutic  tomorrow, so will increase the drip rate ~1unit/kg/hr.  Goal of Therapy:  Heparin level 0.3-0.5 - will use lower end of therapeutic goal Monitor platelets by anticoagulation protocol: Yes   Plan:  -Increase heparin to 1600 units/hr -Monitor heparin level, CBC, S/Sx bleeding daily   Nolen Mu PharmD PGY1 Pharmacy Practice Resident 12/09/2016 9:32 AM Pager: [phone]

## 2016-12-09 NOTE — Progress Notes (Addendum)
Nutrition Follow-up  DOCUMENTATION CODES:   Obesity unspecified  INTERVENTION:  - Continue current TF regimen and will assess at follow-up if adjustment is needed; may be dependent on desire for extubation versus pursuing trach.  - Free water flush per MD/NP, if desired, given hypernatremia; current Lasix order noted.  NUTRITION DIAGNOSIS:   Inadequate oral intake related to inability to eat as evidenced by NPO status. -ongoing  GOAL:   Provide needs based on ASPEN/SCCM guidelines -met with TF regimen  MONITOR:   Vent status, TF tolerance, Weight trends, Labs, I & O's  ASSESSMENT:   71 y.o.M with CHF (EF 15%), HTN, DM, PAD, and OA. Pt admitted with peritonitis and septic shock secondary to perforated ulcer s/p exp lap oversew pyloric channel ulcer, graham omental patch repair for perforated ulcer. Pt transferred to [facility] for brachial embolectomy 8/13.  SIGNIFICANT EVENTS (per CCM NP note 8/22): 08/10 - Admit w/ pneumoperitoneum >> taken to OR emergently for omental patch of gastric ulcer perforation 08/11 - High grade fever to 105F. Stress-dosed steroid started. 08/12 - Stress-dosed steroids tapered to q12hr 08/13 - Transfer to [facility] for arterial embolectomy 08/14 - Off pressors. Stopped Steroids. Altered mentation that is worse post-op & without any drips. Heparin drip stopped w/ bleeding from op site. 08/15 - More awake & following commands. Started on triple therapy for H pylori 08/16 - No evidence of CVA on repeat CT. Heparin drip restarted for possible leg ishcemia. 08/17 - Advancing tube feedings while weaning TPN. Worsening shock. 8/20 - Tol TF, still on Levo weaning down. Fail SBT. Waking up fine.   8/22 No family/visitors present. NGT in place and pt receiving Vital High Protein @ 30 mL/hr with 60 mL Prostat TID. This regimen is providing 1320 kcal (104% maximum estimated kcal need), 153 grams of protein (109% estimated protein need), and 602 mL free water. Noted  that RN documented deep edema to LUE, moderate RUE, and mild edema to BLE. Weight -19 lbs/8.8 kg from previous RD assessment.   Per CCM NP note this AM: - May need evaluation for trach.  - MAP goal >65 and off Levo since 9:30 AM on 8/21.  - Transaminitis improving with suspected hepatotoxicity. - Improving septic shock. - Peritonitis 2/2 perforated ulcer.  Patient is currently intubated on ventilator support MV: 11.2 L/min Temp (24hrs), Avg:99.6 F (37.6 C), Min:98.7 F (37.1 C), Max:100.6 F (38.1 C) BP: 102/63 and MAP: 76  Medications reviewed; 40 mg IV Lasix QID, sliding scale Novolog, 2 g IV Mg sulfate x1 run today, 40 mg IV Protonix BID, 40 mEq KCl per NGT x1 dose today.  Labs reviewed; CBGs: 110, 108, and 124 mg/dL today, Na: 149 mmol/L, Cl: 115 mmol/L, K: 3.4 mmol/L, BUN: 41 mg/dL, Ca: 8.1 mg/dL.   Drips: Heparin @ 1600 units/hr, Fentanyl @ 25 mcg/hr.    8/16 - Spoke with Dr Georgette Dover, MD is ready to advance TF.  - Of note MAP running low [phone]) with support of levophed (goal MAP >65) - Phos and K low but being repleted.  - Will order phosphorus and magnesium every 12 hours x 3 occurences per protocol.  - CBG's also elevated on TPN, monitor off TPN  TF: Vital High Protein @ 20 ml/hr -- advance to 30 mL/hr with 60 mL Prostat TID TPN: Clinimix E 5/15 @ 60 ml/hr, decrease to 30 ml/hr x 2 hours, then off   Diet Order:  Diet NPO time specified  Skin:  Wound (see comment) (Non-pressure  abdominal wound, abdominal incision from 8/101, R arm incision from 8/13)  Last BM:  8/22  Height:   Ht Readings from Last 1 Encounters:  12/05/16 '5\' 8"'$  (1.727 m)    Weight:   Wt Readings from Last 1 Encounters:  12/09/16 202 lb 6.1 oz (91.8 kg)    Ideal Body Weight:  70 kg  BMI:  Body mass index is 30.77 kg/m.  Estimated Nutritional Needs:   Kcal:  1000-1270 (11-14 kcal/kg)  Protein:  140 grams (2 grams/kg IBW)  Fluid:  >/= 1.5 L/day  EDUCATION NEEDS:   No  education needs identified at this time    Jarome Matin, MS, RD, LDN, CNSC Inpatient Clinical Dietitian Pager # [phone] After hours/weekend pager # [phone]

## 2016-12-09 NOTE — Progress Notes (Signed)
[facility] Progress Note  9 Days Post-Op  Subjective: CC:  Mildly sedated this AM.  Overnight events - a.fib RVR. Poor ventilation on vent >> emergent bronchoscopy showing ETT impacted w/ mucous and left base infiltrate/ atelectasis.   Objective: Vital signs in last 24 hours: Temp:  [98.4 F (36.9 C)-100.6 F (38.1 C)] 100.6 F (38.1 C) (08/21 2300) Pulse Rate:  [81-123] 94 (08/22 0600) Resp:  [18-38] 30 (08/22 0600) BP: (85-135)/(44-99) 103/57 (08/22 0600) SpO2:  [97 %-100 %] 100 % (08/22 0600) FiO2 (%):  [30 %] 30 % (08/22 0400) Weight:  [91.8 kg (202 lb 6.1 oz)] 91.8 kg (202 lb 6.1 oz) (08/22 0500) Last BM Date: 12/07/16  Intake/Output from previous day: 08/21 0701 - 08/22 0700 In: 1887.5 [I.V.:597.5; NG/GT:1190; IV Piggyback:100] Out: 3480 [Urine:3075; Drains:5; Stool:400] Intake/Output this shift: No intake/output data recorded.  PE: Gen:  sedated, NAD Card: irregular rhythm, regular rate Pulm: weaning on 5/5. Clear to auscultation bilaterally w/ diminished breath sounds in bilateral bases Abd: Soft, non-tender, bowel sounds present in all 4 quadrants, midline incisions w/out active drainage, some slough/necrosis at wound base. Skin: warm and dry, no rashes  Psych: A&Ox3   Lab Results:   Recent Labs  12/08/16 0419 12/09/16 0358  WBC 9.0 8.0  HGB 9.8* 9.5*  HCT 29.8* 29.6*  PLT 381 419*   BMET  Recent Labs  12/08/16 0419 12/09/16 0358  NA 146* 149*  K 3.2* 3.4*  CL 114* 115*  CO2 23 25  GLUCOSE 111* 99  BUN 38* 41*  CREATININE 1.06 1.15  CALCIUM 8.0* 8.1*   PT/INR No results for input(s): LABPROT, INR in the last 72 hours. CMP     Component Value Date/Time   NA 149 (H) 12/09/2016 0358   K 3.4 (L) 12/09/2016 0358   CL 115 (H) 12/09/2016 0358   CO2 25 12/09/2016 0358   GLUCOSE 99 12/09/2016 0358   BUN 41 (H) 12/09/2016 0358   CREATININE 1.15 12/09/2016 0358   CREATININE 0.91 03/04/2015 1618   CALCIUM 8.1 (L) 12/09/2016 0358    PROT 5.0 (L) 12/06/2016 0400   ALBUMIN 1.8 (L) 12/09/2016 0358   AST 45 (H) 12/06/2016 0400   ALT 60 12/06/2016 0400   ALKPHOS 64 12/06/2016 0400   BILITOT 1.4 (H) 12/06/2016 0400   GFRNONAA >60 12/09/2016 0358   GFRAA >60 12/09/2016 0358   Lipase     Component Value Date/Time   LIPASE 20 12/01/2016 0304       Studies/Results: Dg Chest Port 1 View  Result Date: 12/09/2016 CLINICAL DATA:  Pulmonary edema. EXAM: PORTABLE CHEST 1 VIEW COMPARISON:  12/08/2016. FINDINGS: Endotracheal tube, NG tube, right IJ line stable position. Cardiac pacer stable position. Stable cardiomegaly. Low lung volumes. Improved aeration left lung base. No definite infiltrate. No prominent effusion. No pneumothorax. IMPRESSION: 1.  Lines and tubes in stable position. 2. Improved aeration left lung base. No definite infiltrate noted on today's exam. Low lung volumes. 3. Cardiac pacer stable position.  Stable cardiomegaly. Electronically Signed   By: Maisie Fus  Register   On: 12/09/2016 06:59   Dg Chest Port 1 View  Result Date: 12/08/2016 CLINICAL DATA:  Hypoxia EXAM: PORTABLE CHEST 1 VIEW COMPARISON:  December 06, 2016 FINDINGS: Endotracheal tube tip is 6.6 cm above the carina. Central catheter tip is in superior vena cava. Nasogastric tube tip and side port are in the stomach. Pacemaker lead is attached to the right ventricle. No pneumothorax. There is a small left  pleural effusion with patchy airspace consolidation in the left lower lobe. Lungs elsewhere are clear. Heart is mildly enlarged with pulmonary vascularity within normal limits. There is aortic atherosclerosis. No adenopathy. There is degenerative change in each shoulder. IMPRESSION: Tube and catheter positions as described without pneumothorax. Small left pleural effusion with probable pneumonia left lower lobe. Right lung clear. Stable cardiac prominence. There is aortic atherosclerosis. Aortic Atherosclerosis (ICD10-I70.0). Electronically Signed   By:  Bretta Bang III M.D.   On: 12/08/2016 20:20    Anti-infectives: Anti-infectives    Start     Dose/Rate Route Frequency Ordered Stop   12/04/16 2200  clarithromycin (BIAXIN) tablet 500 mg     500 mg Per Tube Every 12 hours 12/04/16 1107     12/04/16 2000  piperacillin-tazobactam (ZOSYN) IVPB 3.375 g     3.375 g 12.5 mL/hr over 240 Minutes Intravenous Every 8 hours 12/04/16 1339 12/08/16 2359   12/04/16 1600  metroNIDAZOLE (FLAGYL) tablet 500 mg     500 mg Per Tube Every 8 hours 12/04/16 1107     12/02/16 1100  clarithromycin (BIAXIN) 250 MG/5ML suspension 500 mg  Status:  Discontinued     500 mg Per Tube Every 12 hours 12/02/16 1012 12/04/16 1107   12/02/16 1045  metroNIDAZOLE (FLAGYL) 50 mg/ml oral suspension 500 mg  Status:  Discontinued     500 mg Per Tube 3 times daily 12/02/16 1036 12/04/16 1107   12/02/16 1015  metroNIDAZOLE (FLAGYL) 50 mg/ml oral suspension 250 mg  Status:  Discontinued     250 mg Per Tube 4 times daily 12/02/16 1011 12/02/16 1036   11/28/16 0800  fluconazole (DIFLUCAN) IVPB 200 mg  Status:  Discontinued     200 mg 100 mL/hr over 60 Minutes Intravenous Every 24 hours 11/27/16 0624 12/02/16 1018   11/27/16 0630  fluconazole (DIFLUCAN) IVPB 400 mg     400 mg 100 mL/hr over 120 Minutes Intravenous  Once 11/27/16 0622 11/27/16 0841   11/27/16 0600  piperacillin-tazobactam (ZOSYN) IVPB 3.375 g  Status:  Discontinued     3.375 g 12.5 mL/hr over 240 Minutes Intravenous Every 8 hours 11/27/16 0534 12/04/16 1339   11/27/16 0515  piperacillin-tazobactam (ZOSYN) IVPB 3.375 g  Status:  Discontinued     3.375 g 100 mL/hr over 30 Minutes Intravenous  Once 11/27/16 0510 11/27/16 0518   11/27/16 0030  piperacillin-tazobactam (ZOSYN) IVPB 3.375 g     3.375 g 100 mL/hr over 30 Minutes Intravenous  Once 11/27/16 0016 11/27/16 0755   11/27/16 0030  vancomycin (VANCOCIN) IVPB 1000 mg/200 mL premix     1,000 mg 200 mL/hr over 60 Minutes Intravenous  Once 11/27/16 0016  11/27/16 0756       Assessment/Plan Perforated pyloric ulcer  POD#11S/P EXPLORATORY LAPAROTOMY, GRAM PATCH REPAIR8/10/18 Dr. Ovidio Kin - JP d/c-ed 8/21 - tube feeds atgoal rate, continue - continue BID wet to dry dressing changes, may place vac at some point, this wound is certainly at risk and needs to be monitoried - H. pylori IgG elevated. continue IV Protonix   Malnutrition:continue tube feeds, once extubated and cleared to swallow can start diet and ng can be removed. FEN: hypernatremia (149), hypokalemia (3.4), Mg 1.8 ID: leukocytosis resolved. Zosyn 8/10 >>, Glafyl 8/15 >>, Biaxin 8/15 >> (ok to d/c abx from a surgical/abdominal perspective) VTE: SCD's, heparin gtt   LOS: 12 days    Jacob Pittman , [facility] 12/09/2016, 7:52 AM Pager: [phone] Consults: [phone] Mon-Fri  7:00 am-4:30 pm Sat-Sun 7:00 am-11:30 am

## 2016-12-09 NOTE — Progress Notes (Signed)
Patient had a run of [place] around 1915. Patient spontaneously broke. STAT BMET and Mag sent. Results WNL. EKG also obtained, see chart for results. eLink MD made aware.

## 2016-12-09 NOTE — Telephone Encounter (Signed)
New message    FYI -  Pt has been admitted to [facility] 63M daughter just wanted to let you know he is admitted

## 2016-12-10 ENCOUNTER — Inpatient Hospital Stay (HOSPITAL_COMMUNITY): Payer: Medicare PPO

## 2016-12-10 DIAGNOSIS — M7989 Other specified soft tissue disorders: Secondary | ICD-10-CM

## 2016-12-10 LAB — CBC WITH DIFFERENTIAL/PLATELET
Basophils Absolute: 0 10*3/uL (ref 0.0–0.1)
Basophils Relative: 0 %
Eosinophils Absolute: 0 10*3/uL (ref 0.0–0.7)
Eosinophils Relative: 0 %
HCT: 31.2 % — ABNORMAL LOW (ref 39.0–52.0)
Hemoglobin: 9.8 g/dL — ABNORMAL LOW (ref 13.0–17.0)
Lymphocytes Relative: 12 %
Lymphs Abs: 1.2 10*3/uL (ref 0.7–4.0)
MCH: 33.2 pg (ref 26.0–34.0)
MCHC: 31.4 g/dL (ref 30.0–36.0)
MCV: 105.8 fL — ABNORMAL HIGH (ref 78.0–100.0)
Monocytes Absolute: 0.9 10*3/uL (ref 0.1–1.0)
Monocytes Relative: 9 %
Neutro Abs: 7.7 10*3/uL (ref 1.7–7.7)
Neutrophils Relative %: 79 %
Platelets: 455 10*3/uL — ABNORMAL HIGH (ref 150–400)
RBC: 2.95 MIL/uL — ABNORMAL LOW (ref 4.22–5.81)
RDW: 16.3 % — ABNORMAL HIGH (ref 11.5–15.5)
WBC: 9.8 10*3/uL (ref 4.0–10.5)

## 2016-12-10 LAB — MAGNESIUM: Magnesium: 2.1 mg/dL (ref 1.7–2.4)

## 2016-12-10 LAB — GLUCOSE, CAPILLARY
Glucose-Capillary: 108 mg/dL — ABNORMAL HIGH (ref 65–99)
Glucose-Capillary: 123 mg/dL — ABNORMAL HIGH (ref 65–99)
Glucose-Capillary: 131 mg/dL — ABNORMAL HIGH (ref 65–99)
Glucose-Capillary: 100 mg/dL — ABNORMAL HIGH (ref 65–99)
Glucose-Capillary: 112 mg/dL — ABNORMAL HIGH (ref 65–99)

## 2016-12-10 LAB — CULTURE, BLOOD (ROUTINE X 2)
CULTURE: NO GROWTH
CULTURE: NO GROWTH
SPECIAL REQUESTS: ADEQUATE
SPECIAL REQUESTS: ADEQUATE

## 2016-12-10 LAB — RENAL FUNCTION PANEL
Albumin: 1.9 g/dL — ABNORMAL LOW (ref 3.5–5.0)
Anion gap: 6 (ref 5–15)
BUN: 43 mg/dL — ABNORMAL HIGH (ref 6–20)
CO2: 28 mmol/L (ref 22–32)
Calcium: 8.2 mg/dL — ABNORMAL LOW (ref 8.9–10.3)
Chloride: 114 mmol/L — ABNORMAL HIGH (ref 101–111)
Creatinine, Ser: 1.04 mg/dL (ref 0.61–1.24)
GFR calc non Af Amer: >60 mL/min (ref >60)
GLUCOSE: 116 mg/dL — AB (ref 65–99)
Phosphorus: 3 mg/dL (ref 2.5–4.6)
Potassium: 3.6 mmol/L (ref 3.5–5.1)
SODIUM: 148 mmol/L — AB (ref 135–145)

## 2016-12-10 LAB — HEPARIN LEVEL (UNFRACTIONATED): Heparin Unfractionated: 0.65 IU/mL (ref 0.30–0.70)

## 2016-12-10 MED ORDER — FUROSEMIDE 10 MG/ML IJ SOLN
40.0000 mg | Freq: Two times a day (BID) | INTRAMUSCULAR | Status: AC
  Administered 2016-12-10 (×2): 40 mg via INTRAVENOUS
  Filled 2016-12-10 (×2): qty 4

## 2016-12-10 NOTE — Progress Notes (Signed)
10 Days Post-Op   Subjective/Chief Complaint: Intubated sedated, opens eyes, having diarrhea with flexiseal in place   Objective: Vital signs in last 24 hours: Temp:  [98.9 F (37.2 C)-101.6 F (38.7 C)] 101.6 F (38.7 C) (08/23 0346) Pulse Rate:  [84-112] 88 (08/23 0700) Resp:  [23-33] 25 (08/23 0700) BP: (94-127)/(47-79) 98/59 (08/23 0700) SpO2:  [95 %-99 %] 98 % (08/23 0700) FiO2 (%):  [30 %] 30 % (08/23 0400) Weight:  [88.7 kg (195 lb 8.8 oz)] 88.7 kg (195 lb 8.8 oz) (08/23 0500) Last BM Date: 12/09/16  Intake/Output from previous day: 08/22 0701 - 08/23 0700 In: 1456.6 [I.V.:686.6; NG/GT:720; IV Piggyback:50] Out: 4875 [Urine:4225; Stool:650] Intake/Output this shift: No intake/output data recorded.  Gi: wound unchanged with some dusky tissue at midline, no dehiscence, bs present  Lab Results:   Recent Labs  12/09/16 0358 12/10/16 0350  WBC 8.0 9.8  HGB 9.5* 9.8*  HCT 29.6* 31.2*  PLT 419* 455*   BMET  Recent Labs  12/09/16 1924 12/10/16 0350  NA 149* 148*  K 3.8 3.6  CL 115* 114*  CO2 28 28  GLUCOSE 128* 116*  BUN 41* 43*  CREATININE 1.09 1.04  CALCIUM 8.3* 8.2*   PT/INR No results for input(s): LABPROT, INR in the last 72 hours. ABG No results for input(s): PHART, HCO3 in the last 72 hours.  Invalid input(s): PCO2, PO2  Studies/Results: Dg Chest Port 1 View  Result Date: 12/10/2016 CLINICAL DATA:  Intubation. EXAM: PORTABLE CHEST 1 VIEW COMPARISON:  12/09/2016. FINDINGS: Endotracheal tube, NG tube, right IJ line stable position. Cardiac pacer stable position. Stable cardiomegaly. Low lung volumes with mild bibasilar atelectasis noted on today's exam. No pleural effusion. Pneumothorax . IMPRESSION: 1. Lines and tubes in stable position. 2. Low lung volumes with mild basilar atelectasis noted on today's exam. 2.  Stable cardiomegaly. Electronically Signed   By: Maisie Fus  Register   On: 12/10/2016 06:26   Dg Chest Port 1 View  Result Date:  12/09/2016 CLINICAL DATA:  Pulmonary edema. EXAM: PORTABLE CHEST 1 VIEW COMPARISON:  12/08/2016. FINDINGS: Endotracheal tube, NG tube, right IJ line stable position. Cardiac pacer stable position. Stable cardiomegaly. Low lung volumes. Improved aeration left lung base. No definite infiltrate. No prominent effusion. No pneumothorax. IMPRESSION: 1.  Lines and tubes in stable position. 2. Improved aeration left lung base. No definite infiltrate noted on today's exam. Low lung volumes. 3. Cardiac pacer stable position.  Stable cardiomegaly. Electronically Signed   By: Maisie Fus  Register   On: 12/09/2016 06:59   Dg Chest Port 1 View  Result Date: 12/08/2016 CLINICAL DATA:  Hypoxia EXAM: PORTABLE CHEST 1 VIEW COMPARISON:  December 06, 2016 FINDINGS: Endotracheal tube tip is 6.6 cm above the carina. Central catheter tip is in superior vena cava. Nasogastric tube tip and side port are in the stomach. Pacemaker lead is attached to the right ventricle. No pneumothorax. There is a small left pleural effusion with patchy airspace consolidation in the left lower lobe. Lungs elsewhere are clear. Heart is mildly enlarged with pulmonary vascularity within normal limits. There is aortic atherosclerosis. No adenopathy. There is degenerative change in each shoulder. IMPRESSION: Tube and catheter positions as described without pneumothorax. Small left pleural effusion with probable pneumonia left lower lobe. Right lung clear. Stable cardiac prominence. There is aortic atherosclerosis. Aortic Atherosclerosis (ICD10-I70.0). Electronically Signed   By: Bretta Bang III M.D.   On: 12/08/2016 20:20    Anti-infectives: Anti-infectives    Start  Dose/Rate Route Frequency Ordered Stop   12/10/16 0600  amoxicillin (AMOXIL) 250 MG/5ML suspension 1,000 mg     1,000 mg Per Tube Every 12 hours 12/09/16 1701 12/19/16 1759   12/09/16 1130  amoxicillin (AMOXIL) 250 MG/5ML suspension 1,000 mg  Status:  Discontinued     1,000 mg Oral  Every 12 hours 12/09/16 1117 12/09/16 1118   12/09/16 1130  amoxicillin (AMOXIL) 250 MG/5ML suspension 1,000 mg  Status:  Discontinued     1,000 mg Per Tube Every 12 hours 12/09/16 1118 12/09/16 1701   12/04/16 2200  clarithromycin (BIAXIN) tablet 500 mg     500 mg Per Tube Every 12 hours 12/04/16 1107 12/18/16 2159   12/04/16 2000  piperacillin-tazobactam (ZOSYN) IVPB 3.375 g     3.375 g 12.5 mL/hr over 240 Minutes Intravenous Every 8 hours 12/04/16 1339 12/08/16 2359   12/04/16 1600  metroNIDAZOLE (FLAGYL) tablet 500 mg  Status:  Discontinued     500 mg Per Tube Every 8 hours 12/04/16 1107 12/09/16 1117   12/02/16 1100  clarithromycin (BIAXIN) 250 MG/5ML suspension 500 mg  Status:  Discontinued     500 mg Per Tube Every 12 hours 12/02/16 1012 12/04/16 1107   12/02/16 1045  metroNIDAZOLE (FLAGYL) 50 mg/ml oral suspension 500 mg  Status:  Discontinued     500 mg Per Tube 3 times daily 12/02/16 1036 12/04/16 1107   12/02/16 1015  metroNIDAZOLE (FLAGYL) 50 mg/ml oral suspension 250 mg  Status:  Discontinued     250 mg Per Tube 4 times daily 12/02/16 1011 12/02/16 1036   11/28/16 0800  fluconazole (DIFLUCAN) IVPB 200 mg  Status:  Discontinued     200 mg 100 mL/hr over 60 Minutes Intravenous Every 24 hours 11/27/16 0624 12/02/16 1018   11/27/16 0630  fluconazole (DIFLUCAN) IVPB 400 mg     400 mg 100 mL/hr over 120 Minutes Intravenous  Once 11/27/16 0622 11/27/16 0841   11/27/16 0600  piperacillin-tazobactam (ZOSYN) IVPB 3.375 g  Status:  Discontinued     3.375 g 12.5 mL/hr over 240 Minutes Intravenous Every 8 hours 11/27/16 0534 12/04/16 1339   11/27/16 0515  piperacillin-tazobactam (ZOSYN) IVPB 3.375 g  Status:  Discontinued     3.375 g 100 mL/hr over 30 Minutes Intravenous  Once 11/27/16 0510 11/27/16 0518   11/27/16 0030  piperacillin-tazobactam (ZOSYN) IVPB 3.375 g     3.375 g 100 mL/hr over 30 Minutes Intravenous  Once 11/27/16 0016 11/27/16 0755   11/27/16 0030  vancomycin (VANCOCIN)  IVPB 1000 mg/200 mL premix     1,000 mg 200 mL/hr over 60 Minutes Intravenous  Once 11/27/16 0016 11/27/16 0756      Assessment/Plan: Perforated pyloric ulcer  POD#12S/P EXPLORATORY LAPAROTOMY, [place] PATCH REPAIR8/10/18 Dr. Ovidio Kin - tube feeds atgoal rate, continue - continue BID wet to dry dressing changes, may place vac at some point, this wound is certainly at risk and needs to be monitoried - H. pylori IgG elevated. continue IV Protonix  Malnutrition:continue tube feeds, once extubated and cleared to swallow can start diet and ng can be removed, may end up needing trach ID: leukocytosis resolved.  VTE: SCD's, heparin gtt  [facility] 12/10/2016

## 2016-12-10 NOTE — Progress Notes (Signed)
**  Preliminary report by tech**  Left upper extremity venous duplex complete. There is no obvious evidence of deep or superficial vein thrombosis involving the left upper extremity. All clearly visualized vessels appear patent and compressible.  12/10/16 11:31 AM Olen Cordial RVT

## 2016-12-10 NOTE — Progress Notes (Signed)
PULMONARY / CRITICAL CARE MEDICINE   Name: Jacob Pittman MRN: [id number] DOB: 01-21-1946    ADMISSION DATE:  11/26/2016   CONSULTATION DATE:  11/27/2016  REFERRING MD:  Ezzard Standing  CHIEF COMPLAINT:  Abdominal Pain  Brief:   71 year old man with ischemic cardiomyopathy, DM, HTN, PAD, OSA, in overall poor health  Admitted 8/9 with gastric ulcer perforation and peritonitis. Underwent emergent laparotomy with omental patch repair, postoperatively remained in shock requiring epinephrine and Levophed drip. Course c/b acute ischemia of R arm requiring tx from [facility] to [facility] and urgent OR embolectomy, also c/b slow vent wean.   SUBJECTIVE:   Brief run VT overnight. Otherwise no sig change.   Weaning on PS 5/5, sleepy.  Increased secretions per RN.    VITAL SIGNS: BP 110/68   Pulse (!) 102   Temp (!) 101 F (38.3 C) (Oral)   Resp (!) 26   Ht 5\' 8"  (1.727 m)   Wt 88.7 kg (195 lb 8.8 oz)   SpO2 97%   BMI 29.73 kg/m   HEMODYNAMICS: CVP:  [4 mmHg-6 mmHg] 6 mmHg  VENTILATOR SETTINGS: Vent Mode: PRVC FiO2 (%):  [30 %] 30 % Set Rate:  [12 bmp] 12 bmp Vt Set:  [400 mL] 400 mL PEEP:  [5 cmH20] 5 cmH20 Pressure Support:  [5 cmH20] 5 cmH20 Plateau Pressure:  [17 cmH20] 17 cmH20  INTAKE / OUTPUT:  Intake/Output Summary (Last 24 hours) at 12/10/16 7829 Last data filed at 12/10/16 0900  Gross per 24 hour  Intake          1350.87 ml  Output             4725 ml  Net         -3374.13 ml   PHYSICAL EXAMINATION:.  General:  Elderly male, NAD on PS Neuro: sleepy, easily arousable, nods appropriately but falls quickly back to sleep HEENT:  [place]/AT, No JVD noted, PERRL Cardiovascular:  IRIR no MRG Lungs:  resps even non labored on PS 5/5, copious oral secretions, few scattered rhonchi   Abdomen:  Soft, non-distended, non-tender Musculoskeletal:  Swelling and redness to left upper extremity, RUE incision c/d Skin:  Midline ABD dressing noted. Clean, dry    LABS:  BMET  Recent Labs Lab  12/09/16 1710 12/09/16 1924 12/10/16 0350  NA 150* 149* 148*  K 3.9 3.8 3.6  CL 117* 115* 114*  CO2 26 28 28   BUN 39* 41* 43*  CREATININE 0.98 1.09 1.04  GLUCOSE 157* 128* 116*    Electrolytes  Recent Labs Lab 12/08/16 0419 12/09/16 0358 12/09/16 1710 12/09/16 1924 12/10/16 0350  CALCIUM 8.0* 8.1* 8.2* 8.3* 8.2*  MG 1.7 1.8  --  2.1 2.1  PHOS 3.1 3.5  --   --  3.0    CBC  Recent Labs Lab 12/08/16 0419 12/09/16 0358 12/10/16 0350  WBC 9.0 8.0 9.8  HGB 9.8* 9.5* 9.8*  HCT 29.8* 29.6* 31.2*  PLT 381 419* 455*    Coag's No results for input(s): APTT, INR in the last 168 hours.  Sepsis Markers No results for input(s): LATICACIDVEN, PROCALCITON, O2SATVEN in the last 168 hours.  ABG No results for input(s): PHART, PCO2ART, PO2ART in the last 168 hours.  Liver Enzymes  Recent Labs Lab 12/04/16 0448 12/06/16 0400  12/08/16 0419 12/09/16 0358 12/10/16 0350  AST 92* 45*  --   --   --   --   ALT 105* 60  --   --   --   --  ALKPHOS 94 64  --   --   --   --   BILITOT 2.4* 1.4*  --   --   --   --   ALBUMIN 1.9* 1.7*  < > 1.8* 1.8* 1.9*  < > = values in this interval not displayed.  Cardiac Enzymes  Recent Labs Lab 12/05/16 1024 12/05/16 1630 12/05/16 2206  TROPONINI 0.08* 0.11* 0.10*    Glucose  Recent Labs Lab 12/09/16 1111 12/09/16 1620 12/09/16 2031 12/09/16 2341 12/10/16 0343 12/10/16 0835  GLUCAP 124* 137* 124* 120* 108* 123*    IMAGING/STUDIES: CTA CHEST/ABD/PELVIS 8/10: IMPRESSION: 1. Bowel perforation, with a large amount of free air and free fluid noted throughout the abdomen and pelvis. Tiny focus of air along the pylorus and proximal duodenum raises question for the site of perforation. This was better characterized on subsequent surgery. 2. Vague mild soft tissue inflammation about the proximal ileum at the left mid abdomen. 3. No evidence of aortic dissection. No evidence of aneurysmal dilatation. Scattered aortic  atherosclerosis. 4. No evidence of central pulmonary embolus. 5. Vague soft tissue inflammation about the bladder may be reactive in nature, or could reflect mild cystitis. 6. Cardiomegaly.  Scattered coronary artery calcification noted. 7. Trace right-sided pleural fluid. Patchy bibasilar airspace opacities likely reflect atelectasis. 8. Scattered diverticulosis along the ascending colon, without evidence of diverticulitis. 9. Borderline enlarged prostate. 10. Small to moderate bilateral inguinal hernias, containing only fat. TTE 8/12:  LV moderately dilated with EF 15% & diffuse hypokinesis. There is akinesis of the inferolateral and inferior myocardium. Study insufficient to assess diastolic function. LA & RA severely dilated. RV moderately dilated with moderately reduced systolic function. Pacer wires noted. Mild aortic regurgitation without stenosis. Aortic root normal in size. Moderate mitral regurgitation without stenosis. Trivial pulmonic regurgitation without stenosis. Moderate tricuspid regurgitation. No pericardial effusion. CT HEAD W/O 8/14:  Decreased attenuation involving the right dentate nucleus in the right cerebellum and immediately adjacent cerebellar parenchyma, concerning for recent and potentially acute infarct in this area. Elsewhere there is atrophy with moderate periventricular small vessel disease. There is no intracranial mass, hemorrhage, or extra-axial fluid collection. Multiple foci of arterial vascular calcification noted. Areas of paranasal sinus disease. Areas of right-sided mastoid disease. RUQ U/S 8/15: IMPRESSION: 1. No acute abnormality seen at the right upper quadrant. 2. Suggestion of fatty infiltration within the liver. 3. Tiny stones and mild sludge within the gallbladder. No definite evidence for obstruction or cholecystitis. CT HEAD W/O 8/16: IMPRESSION: 1. No acute finding. No acute infarct detected in the cerebellum as questioned previously. 2. Small  remote bilateral cerebellar infarcts. Chronic small vessel ischemia in the cerebral white matter. CXR 12/06/16- basal atelectasis, ETT and CVL in place. Images reviewed.  [place] Left Upper 8/22 >>   MICROBIOLOGY: Blood Culture x2 8/9 >>>NEG Urine Culture 8/9:  E coli Abdominal Wound Culture 8/10:  Normal Skin Flora  MRSA PCR 8/10:  Negative  Blood Cultures x2 8/10:  Negative  H. Pylori IgG:  5.00 (positive) Acute Hepatitis Panel 8/15:  Negative   ANTIBIOTICS: Vancomycin 8/10 (x1 dose) Diflucan 8/10 - 8/15 Zosyn 8/10 > 8/21  Flagyl 8/15 > 8/22  Biaxin 8/15 >>  Amoxicillin 8/22 >>   SIGNIFICANT EVENTS: 08/10 - Admit w/ pneumoperitoneum >> taken to OR emergently for omental patch of gastric ulcer perforation 08/11 - High grade fever to 105F. Stress-dosed steroid started. 08/12 - Stress-dosed steroids tapered to q12hr 08/13 - Transfer to [facility] for arterial embolectomy 08/14 - Off  pressors. Stopped Steroids. Altered mentation that is worse post-op & without any drips. Heparin drip stopped w/ bleeding from op site. 08/15 - More awake & following commands. Started on triple therapy for H pylori 08/16 - No evidence of CVA on repeat CT. Heparin drip restarted for possible leg ishcemia. 08/17 - Advancing tube feedings while weaning TPN. Worsening shock. 8/20 - Tol TF, still on Levo weaning down. Fail SBT. Waking up fine.  LINES/TUBES: R BRACHIAL ART LINE 8/10 (out) OETT 8/10 >>  R IJ DL CVL 9/14 >>> ABD JP Drain 8/10 > 8/21  Foley >>> OGT >>>  ASSESSMENT / PLAN: 71 y.o. male with multiple medical problems. S/p e lap for perf ulcer. Post op course complicated by ischemic rt hand from a line s/p thrombectomy by vascular surgery.  PULMONARY A: Acute hypoxic respiratory failure: Multifactorial. Large component likely due to volume overload.  P: Vent support - 8cc/kg  F/u CXR  F/u ABG PS wean as tol - need to discuss possibility for trach given vent D13 and ongoing thick secretions but  may deserve trial at extubation given decent weaning over last few days, improving mental status  Duonebs scheduled  Diuresis as below   CARDIOVASCULAR A: Right Upper Extremity Arterial Clot:  Secondary to arterial line. S/P embolectomy w/ vein patch 8/13.  Shock Biventricular Heart Failure Elevated Troponin I:  Likely demand given acute illness.  Flat trend Paroxysmal Atrial Fibrillation H/O NICM:  S/P ICD 2014 EF 15%, Essential Hypertension, Hyperlipidemia, PAD P: Cardiac Monitoring  Continue heparin gtt Continue ASA  U/S left upper - pending  Gentle diuresis   RENAL A: Acute renal failure: Resolving.  Hypokalemia: Replaced. P: Trend BMP  Replace electrolytes as indicated  Strict I & O  Lasix 40 mg q12h x 2 doses   GASTROINTESTINAL A: Perforated Pyloric Channel Ulcer:  S/P Repair 8/10 by Dr. Ezzard Standing. H. pylori IgG elevated. Ileus:  Post-op. Improving. Severe Protein-Calorie Malnutrition Transaminitis:  Improving. Suspect hepatotoxicity from Diflucan.  P: Surgery Following  Tolerating tube feeds PPI  HEME A: Leukocytosis: Improving 22.6 > 11 Thrombocytopenia:  Resolved. Likely due to splenic sequestration & consumption. P: Follow CBC Heparin infusion for AF  INFECTIOUS DISEASE A: Septic Shock -resolved Peritonitis:  Secondary to perforated ulcer. E coli UTI Helicobacter pylori infection C diff negative P: Trend WBC and Fever Curve  Continuing Biaxin and Amoxicillin until 8/29 for H. Pylori  ENDOCRINE A: Diabetes Mellitus:  Glucose controlled. Hypoglycemia:  On 8/11.  P: SSI coverage Trend Glucose   NEUROLOGIC A: Sedation on Ventilator Acute Encephalopathy: Significantly improved. S/P high dose IV thiamine. Possible Cerebellar CVA:  Not seen on repeat CT. Unable to get MRI w/ implanted device. Neurology previously consulted. H/O anxiety, right hip pain & osteoarthritis, EtOH Use P: Desired RASS:  0 to -1 D/c fent gtt Add PRN fentanyl Versed  PRN  Family Update:  No family present 8/23    Dirk Dress, NP 12/10/2016  9:29 AM Pager: [phone] or [phone]  Attending Note:  71 year old male s/p perforated viscus repair that was intubated for 13 days with increased secretions.  Patient was diuresed and improved over the past 48 hours.  Weaning very well on exam.  Will d/c sedation, allow patient to wake up then will attempt extubation.  In the meantime, continue to keep patient as fluid even as possible.  Continue biaxin and amox.  F/u on cultures.  Keep BS under control.  Will need to evaluate use of  GI tract and SLP later post extubation.    The patient is critically ill with multiple organ systems failure and requires high complexity decision making for assessment and support, frequent evaluation and titration of therapies, application of advanced monitoring technologies and extensive interpretation of multiple databases.   Critical Care Time devoted to patient care services described in this note is  35  Minutes. This time reflects time of care of this signee Dr Koren Bound. This critical care time does not reflect procedure time, or teaching time or supervisory time of PA/NP/Med student/Med Resident etc but could involve care discussion time.  Alyson Reedy, M.D. [facility] Pulmonary/Critical Care Medicine. Pager: [phone]. After hours pager: [phone].

## 2016-12-10 NOTE — Procedures (Signed)
Extubation Procedure Note  Patient Details:   Name: Jacob Pittman DOB: 02/05/1946 MRN: [id number]   Airway Documentation:     Evaluation  O2 sats: stable throughout Complications: No apparent complications Patient did tolerate procedure well. Bilateral Breath Sounds: Clear, Diminished   Yes   Pt extubated to 3L [place] per MD order. Pt stable throughout with no complications. VS within normal limits, pt able to speak and has strong productive cough post extubation. RN at bedside. PT instructed on use of [place] to clear secretions. RT will continue to monitor.   Carolan Shiver 12/10/2016, 11:38 AM

## 2016-12-10 NOTE — Progress Notes (Signed)
ANTICOAGULATION CONSULT NOTE - Follow-Up Consult  Pharmacy Consult for Heparin Indication: atrial fibrillation, ischemic limb  No Known Allergies  Patient Measurements: Height: 5\' 8"  (172.7 cm) Weight: 195 lb 8.8 oz (88.7 kg) IBW/kg (Calculated) : 68.4 Heparin Dosing Weight: 89.6 kg  Vital Signs: Temp: 101 F (38.3 C) (08/23 0838) Temp Source: Oral (08/23 0838) BP: 103/61 (08/23 0828) Pulse Rate: 85 (08/23 0828)  Labs:  Recent Labs  12/08/16 0419 12/08/16 0420 12/09/16 0358 12/09/16 1710 12/09/16 1924 12/10/16 0350  HGB 9.8*  --  9.5*  --   --  9.8*  HCT 29.8*  --  29.6*  --   --  31.2*  PLT 381  --  419*  --   --  455*  HEPARINUNFRC  --  0.37 0.31  --   --  0.65  CREATININE 1.06  --  1.15 0.98 1.09 1.04   Estimated Creatinine Clearance: 68.4 mL/min (by C-G formula based on SCr of 1.04 mg/dL).  Medical History: Past Medical History:  Diagnosis Date  . ANXIETY 02/04/2009  . CHF (congestive heart failure) (HCC)   . Chronic systolic CHF (congestive heart failure) (HCC)   . DIABETES MELLITUS, TYPE II 11/07/2008  . 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 HIP PAIN, RIGHT 11/06/2009  . HYPERLIPIDEMIA 11/07/2008  . HYPERTENSION 10/08/2008  . Nonischemic cardiomyopathy [facility])    s/p ICD implantation 03-2013 by Dr Ladona Ridgel  . OSTEOARTHRITIS, KNEE, RIGHT 10/08/2008  . Peripheral arterial disease [facility])    Assessment: 71 yo male admitted 8/9 with gastric ulcer perforation and peritonitis. Underwent emergent laparotomy with omental patch repair. Post op course complicated by multiorgan failure requiring intubation and pressors.   On 8/13, Pharmacy consulted to dose IV heparin for afib and presumed ischemic right upper extremity. Heparin held 8/13 secondary to bleeding at incision site, resumed on 8/16 after repeat CT negative for acute infarct and pt had cool extremity.   Heparin therapeutic at 0.65 after empiric increase yesterday. CBC stable, no bleeding noted. Will slightly  decrease heparin by ~0.5units/kg/hr.  Goal of Therapy:  Heparin level 0.3-0.5 - will use lower end of therapeutic goal Monitor platelets by anticoagulation protocol: Yes   Plan:  -Decrease heparin to 1550 units/hr -Monitor heparin level, CBC, S/Sx bleeding daily   Nolen Mu PharmD PGY1 Pharmacy Practice Resident 12/10/2016 8:55 AM Pager: [phone]

## 2016-12-11 DIAGNOSIS — J810 Acute pulmonary edema: Secondary | ICD-10-CM

## 2016-12-11 LAB — CBC WITH DIFFERENTIAL/PLATELET
BASOS ABS: 0 10*3/uL (ref 0.0–0.1)
Basophils Relative: 0 %
EOS PCT: 0 %
Eosinophils Absolute: 0 10*3/uL (ref 0.0–0.7)
HEMATOCRIT: 32 % — AB (ref 39.0–52.0)
HEMOGLOBIN: 9.9 g/dL — AB (ref 13.0–17.0)
LYMPHS ABS: 1.3 10*3/uL (ref 0.7–4.0)
LYMPHS PCT: 13 %
MCH: 33.3 pg (ref 26.0–34.0)
MCHC: 30.9 g/dL (ref 30.0–36.0)
MCV: 107.7 fL — AB (ref 78.0–100.0)
MONOS PCT: 8 %
Monocytes Absolute: 0.8 10*3/uL (ref 0.1–1.0)
NEUTROS PCT: 79 %
Neutro Abs: 7.9 10*3/uL — ABNORMAL HIGH (ref 1.7–7.7)
Platelets: 471 10*3/uL — ABNORMAL HIGH (ref 150–400)
RBC: 2.97 MIL/uL — AB (ref 4.22–5.81)
RDW: 16 % — ABNORMAL HIGH (ref 11.5–15.5)
WBC: 10 10*3/uL (ref 4.0–10.5)

## 2016-12-11 LAB — BASIC METABOLIC PANEL
Anion gap: 9 (ref 5–15)
BUN: 47 mg/dL — AB (ref 6–20)
CHLORIDE: 113 mmol/L — AB (ref 101–111)
CO2: 31 mmol/L (ref 22–32)
Calcium: 8.5 mg/dL — ABNORMAL LOW (ref 8.9–10.3)
Creatinine, Ser: 1.06 mg/dL (ref 0.61–1.24)
Glucose, Bld: 136 mg/dL — ABNORMAL HIGH (ref 65–99)
POTASSIUM: 3.6 mmol/L (ref 3.5–5.1)
SODIUM: 153 mmol/L — AB (ref 135–145)

## 2016-12-11 LAB — GLUCOSE, CAPILLARY
GLUCOSE-CAPILLARY: 116 mg/dL — AB (ref 65–99)
GLUCOSE-CAPILLARY: 118 mg/dL — AB (ref 65–99)
GLUCOSE-CAPILLARY: 119 mg/dL — AB (ref 65–99)
GLUCOSE-CAPILLARY: 131 mg/dL — AB (ref 65–99)
Glucose-Capillary: 137 mg/dL — ABNORMAL HIGH (ref 65–99)
Glucose-Capillary: 164 mg/dL — ABNORMAL HIGH (ref 65–99)

## 2016-12-11 LAB — MAGNESIUM: Magnesium: 2 mg/dL (ref 1.7–2.4)

## 2016-12-11 LAB — URINE CULTURE: Culture: NO GROWTH

## 2016-12-11 LAB — HEPARIN LEVEL (UNFRACTIONATED): HEPARIN UNFRACTIONATED: 0.61 [IU]/mL (ref 0.30–0.70)

## 2016-12-11 NOTE — Progress Notes (Signed)
PULMONARY / CRITICAL CARE MEDICINE   Name: Jacob Pittman MRN: [id number] DOB: [birth year]    ADMISSION DATE:  11/26/2016   CONSULTATION DATE:  11/27/2016  REFERRING MD:  Ezzard Standing  CHIEF COMPLAINT:  Abdominal Pain  Brief:   71 year old man with ischemic cardiomyopathy, DM, HTN, PAD, OSA, in overall poor health  Admitted 8/9 with gastric ulcer perforation and peritonitis. Underwent emergent laparotomy with omental patch repair, postoperatively remained in shock requiring epinephrine and Levophed drip. Course c/b acute ischemia of R arm requiring tx from [facility] to [facility] and urgent OR embolectomy, also c/b slow vent wean.   SUBJECTIVE:   Extubated and doing well, no events overnight   VITAL SIGNS: BP 104/68 (BP Location: Left Arm)   Pulse 99   Temp 98.2 F (36.8 C) (Oral)   Resp (!) 29   Ht 5\' 8"  (1.727 m)   Wt 85.5 kg (188 lb 7.9 oz)   SpO2 98%   BMI 28.66 kg/m   HEMODYNAMICS:    VENTILATOR SETTINGS: Vent Mode: CPAP;PSV FiO2 (%):  [30 %] 30 % PEEP:  [5 cmH20] 5 cmH20 Pressure Support:  [5 cmH20] 5 cmH20  INTAKE / OUTPUT:  Intake/Output Summary (Last 24 hours) at 12/11/16 4098 Last data filed at 12/11/16 0800  Gross per 24 hour  Intake           1376.5 ml  Output             2995 ml  Net          -1618.5 ml   PHYSICAL EXAMINATION:.  General:  Chronically ill appearing male, NAD Neuro: Awake and interactive, moving all ext to command HEENT:  [place]/AT, PERRL, EOM-I and MMM Cardiovascular:  IRIR, Nl S1/S2, -M/R/G Lungs:  CTA bilaterally Abdomen:  Soft, NT, ND and +BS Musculoskeletal:   Swelling and redness to left upper extremity, RUE incision c/d Skin:  Midline ABD dressing noted. Clean, dry   LABS:  BMET  Recent Labs Lab 12/09/16 1924 12/10/16 0350 12/11/16 0305  NA 149* 148* 153*  K 3.8 3.6 3.6  CL 115* 114* 113*  CO2 28 28 31   BUN 41* 43* 47*  CREATININE 1.09 1.04 1.06  GLUCOSE 128* 116* 136*    Electrolytes  Recent Labs Lab 12/08/16 0419  12/09/16 0358  12/09/16 1924 12/10/16 0350 12/11/16 0305  CALCIUM 8.0* 8.1*  < > 8.3* 8.2* 8.5*  MG 1.7 1.8  --  2.1 2.1 2.0  PHOS 3.1 3.5  --   --  3.0  --   < > = values in this interval not displayed.  CBC  Recent Labs Lab 12/09/16 0358 12/10/16 0350 12/11/16 0305  WBC 8.0 9.8 10.0  HGB 9.5* 9.8* 9.9*  HCT 29.6* 31.2* 32.0*  PLT 419* 455* 471*    Coag's No results for input(s): APTT, INR in the last 168 hours.  Sepsis Markers No results for input(s): LATICACIDVEN, PROCALCITON, O2SATVEN in the last 168 hours.  ABG No results for input(s): PHART, PCO2ART, PO2ART in the last 168 hours.  Liver Enzymes  Recent Labs Lab 12/06/16 0400  12/08/16 0419 12/09/16 0358 12/10/16 0350  AST 45*  --   --   --   --   ALT 60  --   --   --   --   ALKPHOS 64  --   --   --   --   BILITOT 1.4*  --   --   --   --   ALBUMIN  1.7*  < > 1.8* 1.8* 1.9*  < > = values in this interval not displayed.  Cardiac Enzymes  Recent Labs Lab 12/05/16 1024 12/05/16 1630 12/05/16 2206  TROPONINI 0.08* 0.11* 0.10*    Glucose  Recent Labs Lab 12/10/16 1119 12/10/16 1609 12/10/16 2042 12/11/16 0016 12/11/16 0305 12/11/16 0858  GLUCAP 131* 100* 112* 119* 137* 131*    IMAGING/STUDIES: CTA CHEST/ABD/PELVIS 8/10: IMPRESSION: 1. Bowel perforation, with a large amount of free air and free fluid noted throughout the abdomen and pelvis. Tiny focus of air along the pylorus and proximal duodenum raises question for the site of perforation. This was better characterized on subsequent surgery. 2. Vague mild soft tissue inflammation about the proximal ileum at the left mid abdomen. 3. No evidence of aortic dissection. No evidence of aneurysmal dilatation. Scattered aortic atherosclerosis. 4. No evidence of central pulmonary embolus. 5. Vague soft tissue inflammation about the bladder may be reactive in nature, or could reflect mild cystitis. 6. Cardiomegaly.  Scattered coronary artery  calcification noted. 7. Trace right-sided pleural fluid. Patchy bibasilar airspace opacities likely reflect atelectasis. 8. Scattered diverticulosis along the ascending colon, without evidence of diverticulitis. 9. Borderline enlarged prostate. 10. Small to moderate bilateral inguinal hernias, containing only fat. TTE 8/12:  LV moderately dilated with EF 15% & diffuse hypokinesis. There is akinesis of the inferolateral and inferior myocardium. Study insufficient to assess diastolic function. LA & RA severely dilated. RV moderately dilated with moderately reduced systolic function. Pacer wires noted. Mild aortic regurgitation without stenosis. Aortic root normal in size. Moderate mitral regurgitation without stenosis. Trivial pulmonic regurgitation without stenosis. Moderate tricuspid regurgitation. No pericardial effusion. CT HEAD W/O 8/14:  Decreased attenuation involving the right dentate nucleus in the right cerebellum and immediately adjacent cerebellar parenchyma, concerning for recent and potentially acute infarct in this area. Elsewhere there is atrophy with moderate periventricular small vessel disease. There is no intracranial mass, hemorrhage, or extra-axial fluid collection. Multiple foci of arterial vascular calcification noted. Areas of paranasal sinus disease. Areas of right-sided mastoid disease. RUQ U/S 8/15: IMPRESSION: 1. No acute abnormality seen at the right upper quadrant. 2. Suggestion of fatty infiltration within the liver. 3. Tiny stones and mild sludge within the gallbladder. No definite evidence for obstruction or cholecystitis. CT HEAD W/O 8/16: IMPRESSION: 1. No acute finding. No acute infarct detected in the cerebellum as questioned previously. 2. Small remote bilateral cerebellar infarcts. Chronic small vessel ischemia in the cerebral white matter. CXR 12/06/16- basal atelectasis, ETT and CVL in place. Images reviewed.  [place] Left Upper 8/22 >>   MICROBIOLOGY: Blood  Culture x2 8/9 >>>NEG Urine Culture 8/9:  E coli Abdominal Wound Culture 8/10:  Normal Skin Flora  MRSA PCR 8/10:  Negative  Blood Cultures x2 8/10:  Negative  H. Pylori IgG:  5.00 (positive) Acute Hepatitis Panel 8/15:  Negative   ANTIBIOTICS: Vancomycin 8/10 (x1 dose) Diflucan 8/10 - 8/15 Zosyn 8/10 > 8/21  Flagyl 8/15 > 8/22  Biaxin 8/15 >>  Amoxicillin 8/22 >>   SIGNIFICANT EVENTS: 08/10 - Admit w/ pneumoperitoneum >> taken to OR emergently for omental patch of gastric ulcer perforation 08/11 - High grade fever to 105F. Stress-dosed steroid started. 08/12 - Stress-dosed steroids tapered to q12hr 08/13 - Transfer to [facility] for arterial embolectomy 08/14 - Off pressors. Stopped Steroids. Altered mentation that is worse post-op & without any drips. Heparin drip stopped w/ bleeding from op site. 08/15 - More awake & following commands. Started on triple therapy for H  pylori 08/16 - No evidence of CVA on repeat CT. Heparin drip restarted for possible leg ishcemia. 08/17 - Advancing tube feedings while weaning TPN. Worsening shock. 8/20 - Tol TF, still on Levo weaning down. Fail SBT. Waking up fine.  LINES/TUBES: R BRACHIAL ART LINE 8/10 (out) OETT 8/10 >> 8/23 R IJ DL CVL 5/78 >>> ABD JP Drain 8/10 > 8/21  Foley >>> OGT >>>8/23  I reviewed CXR myself, ETT in good position  ASSESSMENT / PLAN: 71 y.o. male with multiple medical problems. S/p e lap for perf ulcer. Post op course complicated by ischemic rt hand from a line s/p thrombectomy by vascular surgery.  PULMONARY A: Acute hypoxic respiratory failure: Multifactorial. Large component likely due to volume overload.  P: PT OOB Titrate O2 for sat of 88-92%  CARDIOVASCULAR A: Right Upper Extremity Arterial Clot:  Secondary to arterial line. S/P embolectomy w/ vein patch 8/13.  Shock Biventricular Heart Failure Elevated Troponin I:  Likely demand given acute illness.  Flat trend Paroxysmal Atrial Fibrillation H/O  NICM:  S/P ICD 2014 EF 15%, Essential Hypertension, Hyperlipidemia, PAD P: Cardiac Monitoring  Continue heparin gtt Continue ASA  Hold further diureses  RENAL A: Acute renal failure: Resolving.  Hypokalemia: Replaced. P: Trend BMP  Replace electrolytes as indicated  Strict I & O  Hold diureses given hypernatremia  GASTROINTESTINAL A: Perforated Pyloric Channel Ulcer:  S/P Repair 8/10 by Dr. Ezzard Standing. H. pylori IgG elevated. Ileus:  Post-op. Improving. Severe Protein-Calorie Malnutrition Transaminitis:  Improving. Suspect hepatotoxicity from Diflucan.  P: Surgery Following  Tolerating tube feeds PPI  HEME A: Leukocytosis: Improving 22.6 > 11 Thrombocytopenia:  Resolved. Likely due to splenic sequestration & consumption. P: Follow CBC Heparin infusion for AF  INFECTIOUS DISEASE A: Septic Shock -resolved Peritonitis:  Secondary to perforated ulcer. E coli UTI Helicobacter pylori infection C diff negative P: Trend WBC and Fever Curve  Continuing Biaxin and Amoxicillin until 8/29 for H. Pylori  ENDOCRINE A: Diabetes Mellitus:  Glucose controlled. Hypoglycemia:  On 8/11.  P: SSI coverage Trend Glucose   NEUROLOGIC A: Sedation on Ventilator Acute Encephalopathy: Significantly improved. S/P high dose IV thiamine. Possible Cerebellar CVA:  Not seen on repeat CT. Unable to get MRI w/ implanted device. Neurology previously consulted. H/O anxiety, right hip pain & osteoarthritis, EtOH Use P: PRN fentanyl D/C all sedation  Patient updated bedside, discussed with [facility]-MD, transfer to SDU and to [facility] service with PCCM off 8/25.  Alyson Reedy, M.D. [facility] Pulmonary/Critical Care Medicine. Pager: [phone]. After hours pager: [phone].

## 2016-12-11 NOTE — Progress Notes (Signed)
ANTICOAGULATION CONSULT NOTE - Follow-Up Consult  Pharmacy Consult for Heparin Indication: atrial fibrillation, ischemic limb  No Known Allergies  Patient Measurements: Height: 5\' 8"  (172.7 cm) Weight: 188 lb 7.9 oz (85.5 kg) IBW/kg (Calculated) : 68.4 Heparin Dosing Weight: 89.6 kg  Vital Signs: Temp: 98.2 F (36.8 C) (08/24 0855) Temp Source: Oral (08/24 0855) BP: 104/68 (08/24 0800) Pulse Rate: 99 (08/24 0800)  Labs:  Recent Labs  12/09/16 0358  12/09/16 1924 12/10/16 0350 12/11/16 0305  HGB 9.5*  --   --  9.8* 9.9*  HCT 29.6*  --   --  31.2* 32.0*  PLT 419*  --   --  455* 471*  HEPARINUNFRC 0.31  --   --  0.65 0.61  CREATININE 1.15  < > 1.09 1.04 1.06  < > = values in this interval not displayed. Estimated Creatinine Clearance: 66 mL/min (by C-G formula based on SCr of 1.06 mg/dL).  Medical History: Past Medical History:  Diagnosis Date  . ANXIETY 02/04/2009  . CHF (congestive heart failure) (HCC)   . Chronic systolic CHF (congestive heart failure) (HCC)   . DIABETES MELLITUS, TYPE II 11/07/2008  . 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 HIP PAIN, RIGHT 11/06/2009  . HYPERLIPIDEMIA 11/07/2008  . HYPERTENSION 10/08/2008  . Nonischemic cardiomyopathy [facility])    s/p ICD implantation 03-2013 by Dr Ladona Ridgel  . OSTEOARTHRITIS, KNEE, RIGHT 10/08/2008  . Peripheral arterial disease [facility])    Assessment: 71 yo male admitted 8/9 with gastric ulcer perforation and peritonitis. Underwent emergent laparotomy with omental patch repair. Post op course complicated by multiorgan failure requiring intubation and pressors.   On 8/13, Pharmacy consulted to dose IV heparin for afib and presumed ischemic right upper extremity. Heparin held 8/13 secondary to bleeding at incision site, resumed on 8/16 after repeat CT negative for acute infarct and pt had cool extremity.   Heparin therapeutic at 0.61 today, will continue current rate. CBC stable, no s/sx of bleeding.  Goal of Therapy:   Heparin level 0.3-0.7 Monitor platelets by anticoagulation protocol: Yes   Plan:  -Continue heparin 1550 units/hr -Monitor heparin level, CBC, S/Sx bleeding daily   Nolen Mu PharmD PGY1 Pharmacy Practice Resident 12/11/2016 9:13 AM Pager: [phone]

## 2016-12-11 NOTE — Evaluation (Signed)
Clinical/Bedside Swallow Evaluation Patient Details  Name: Jacob Pittman MRN: [id number] Date of Birth: 10/06/1945  Today's Date: 12/11/2016 Time: SLP Start Time (ACUTE ONLY): 1337 SLP Stop Time (ACUTE ONLY): 1347 SLP Time Calculation (min) (ACUTE ONLY): 10 min  Past Medical History:  Past Medical History:  Diagnosis Date  . ANXIETY 02/04/2009  . CHF (congestive heart failure) (HCC)   . Chronic systolic CHF (congestive heart failure) (HCC)   . DIABETES MELLITUS, TYPE II 11/07/2008  . 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 HIP PAIN, RIGHT 11/06/2009  . HYPERLIPIDEMIA 11/07/2008  . HYPERTENSION 10/08/2008  . Nonischemic cardiomyopathy [facility])    s/p ICD implantation 03-2013 by Dr Ladona Ridgel  . OSTEOARTHRITIS, KNEE, RIGHT 10/08/2008  . Peripheral arterial disease (HCC)    Past Surgical History:  Past Surgical History:  Procedure Laterality Date  . CARDIAC DEFIBRILLATOR PLACEMENT Left 04/10/13   BTK single chamber ICD implanted by Dr Ladona Ridgel for primary prevention  . EMBOLECTOMY Right 11/30/2016   Procedure: BRACHIAL EMBOLECTOMY WITH VEIN PATCH ANGIOPLASTY;  Surgeon: Sherren Kerns, MD;  Location: [facility] OR;  Service: Vascular;  Laterality: Right;  . IMPLANTABLE CARDIOVERTER DEFIBRILLATOR IMPLANT N/A 04/10/2013   Procedure: IMPLANTABLE CARDIOVERTER DEFIBRILLATOR IMPLANT;  Surgeon: Marinus Maw, MD;  Location: [facility] CATH LAB;  Service: Cardiovascular;  Laterality: N/A;  . LAPAROTOMY N/A 11/27/2016   Procedure: EXPLORATORY LAPAROTOMY abdominal exploration oversew pyloric channel ulcer gram patch repair perforated ulcer;  Surgeon: Ovidio Kin, MD;  Location: [facility] ORS;  Service: General;  Laterality: N/A;  . s/p left broken arm with pin     1980's   HPI:  Pt is a 71 year old man with ischemic cardiomyopathy, DM, HTN, PAD, OSA who was admitted 8/9 with gastric ulcer perforation and peritonitis. He underwent emergent laparotomy with omental patch repair, postoperatively remained in shock requiring  epinephrine and Levophed drip. Course c/b acute ischemia of R arm requiring tx from [facility] to [facility] and urgent OR embolectomy, also c/b slow vent wean. He was intubated 8/10-8/23. SLP ordered for swallowing evaluation post-extubation.   Assessment / Plan / Recommendation Clinical Impression  Pt's voice is hoarse but he seems to have good strength and volume despite prolonged intubation. He does have intermittent wet vocal quality and multiple swallows with intake but no overt coughing. Recommend allowing ice chips after oral care and essential meds crushed in applesauce (if needed - pt does have NGT) pending completion of instrumental testing given the potential for silent aspiration. SLP Visit Diagnosis: Dysphagia, unspecified (R13.10)    Aspiration Risk  Moderate aspiration risk    Diet Recommendation NPO except meds;Ice chips PRN after oral care   Medication Administration: Crushed with puree    Other  Recommendations Oral Care Recommendations: Oral care QID;Oral care prior to ice chip/H20   Follow up Recommendations  (tba)      Frequency and Duration            Prognosis Prognosis for Safe Diet Advancement: Good      Swallow Study   General HPI: Pt is a 71 year old man with ischemic cardiomyopathy, DM, HTN, PAD, OSA who was admitted 8/9 with gastric ulcer perforation and peritonitis. He underwent emergent laparotomy with omental patch repair, postoperatively remained in shock requiring epinephrine and Levophed drip. Course c/b acute ischemia of R arm requiring tx from [facility] to [facility] and urgent OR embolectomy, also c/b slow vent wean. He was intubated 8/10-8/23. SLP ordered for swallowing evaluation post-extubation. Type of Study: Bedside Swallow Evaluation Previous Swallow  Assessment: none in chart Diet Prior to this Study: NPO;NG Tube Temperature Spikes Noted: Yes (102.8) Respiratory Status: Nasal cannula History of Recent Intubation: Yes Length of Intubations (days): 13 days Date  extubated: 12/10/16 Behavior/Cognition: Alert;Cooperative Oral Cavity Assessment: Within Functional Limits Oral Care Completed by SLP: No Oral Cavity - Dentition: Edentulous Patient Positioning: Upright in bed Baseline Vocal Quality: Hoarse Volitional Cough: Strong Volitional Swallow: Able to elicit    Oral/Motor/Sensory Function     Ice Chips Ice chips: Within functional limits Presentation: Spoon   Thin Liquid Thin Liquid: Impaired Presentation: Spoon;Straw Pharyngeal  Phase Impairments: Wet Vocal Quality;Multiple swallows    Nectar Thick Nectar Thick Liquid: Not tested   Honey Thick Honey Thick Liquid: Not tested   Puree Puree: Impaired Presentation: Spoon Pharyngeal Phase Impairments: Multiple swallows   Solid   GO   Solid: Not tested        [name] 12/11/2016,3:46 PM  [name], M.A. CCC-SLP [phone]

## 2016-12-11 NOTE — Progress Notes (Signed)
RN notified of pt temp of 101.6 F orally 

## 2016-12-11 NOTE — Progress Notes (Signed)
11 Days Post-Op   Subjective/Chief Complaint: Extubated, responsive, wants to know when can go home   Objective: Vital signs in last 24 hours: Temp:  [98.5 F (36.9 C)-102.8 F (39.3 C)] 98.5 F (36.9 C) (08/24 0400) Pulse Rate:  [82-115] 91 (08/24 0700) Resp:  [23-34] 28 (08/24 0700) BP: (93-127)/(48-75) 106/55 (08/24 0700) SpO2:  [96 %-100 %] 98 % (08/24 0813) FiO2 (%):  [30 %] 30 % (08/23 1119) Weight:  [85.5 kg (188 lb 7.9 oz)] 85.5 kg (188 lb 7.9 oz) (08/24 0300) Last BM Date: 12/10/16  Intake/Output from previous day: 08/23 0701 - 08/24 0700 In: 1416.5 [I.V.:606.5; NG/GT:810] Out: 2945 [Urine:2720; Stool:225] Intake/Output this shift: No intake/output data recorded.  GI: bs present, wound clean with some dusky areas midline, approp tender no infection  Lab Results:   Recent Labs  12/10/16 0350 12/11/16 0305  WBC 9.8 10.0  HGB 9.8* 9.9*  HCT 31.2* 32.0*  PLT 455* 471*   BMET  Recent Labs  12/10/16 0350 12/11/16 0305  NA 148* 153*  K 3.6 3.6  CL 114* 113*  CO2 28 31  GLUCOSE 116* 136*  BUN 43* 47*  CREATININE 1.04 1.06  CALCIUM 8.2* 8.5*   PT/INR No results for input(s): LABPROT, INR in the last 72 hours. ABG No results for input(s): PHART, HCO3 in the last 72 hours.  Invalid input(s): PCO2, PO2  Studies/Results: Dg Chest Port 1 View  Result Date: 12/10/2016 CLINICAL DATA:  Intubation. EXAM: PORTABLE CHEST 1 VIEW COMPARISON:  12/09/2016. FINDINGS: Endotracheal tube, NG tube, right IJ line stable position. Cardiac pacer stable position. Stable cardiomegaly. Low lung volumes with mild bibasilar atelectasis noted on today's exam. No pleural effusion. Pneumothorax . IMPRESSION: 1. Lines and tubes in stable position. 2. Low lung volumes with mild basilar atelectasis noted on today's exam. 2.  Stable cardiomegaly. Electronically Signed   By: Maisie Fus  Register   On: 12/10/2016 06:26    Anti-infectives: Anti-infectives    Start     Dose/Rate Route  Frequency Ordered Stop   12/10/16 0600  amoxicillin (AMOXIL) 250 MG/5ML suspension 1,000 mg     1,000 mg Per Tube Every 12 hours 12/09/16 1701 12/19/16 1759   12/09/16 1130  amoxicillin (AMOXIL) 250 MG/5ML suspension 1,000 mg  Status:  Discontinued     1,000 mg Oral Every 12 hours 12/09/16 1117 12/09/16 1118   12/09/16 1130  amoxicillin (AMOXIL) 250 MG/5ML suspension 1,000 mg  Status:  Discontinued     1,000 mg Per Tube Every 12 hours 12/09/16 1118 12/09/16 1701   12/04/16 2200  clarithromycin (BIAXIN) tablet 500 mg     500 mg Per Tube Every 12 hours 12/04/16 1107 12/18/16 2159   12/04/16 2000  piperacillin-tazobactam (ZOSYN) IVPB 3.375 g     3.375 g 12.5 mL/hr over 240 Minutes Intravenous Every 8 hours 12/04/16 1339 12/08/16 2359   12/04/16 1600  metroNIDAZOLE (FLAGYL) tablet 500 mg  Status:  Discontinued     500 mg Per Tube Every 8 hours 12/04/16 1107 12/09/16 1117   12/02/16 1100  clarithromycin (BIAXIN) 250 MG/5ML suspension 500 mg  Status:  Discontinued     500 mg Per Tube Every 12 hours 12/02/16 1012 12/04/16 1107   12/02/16 1045  metroNIDAZOLE (FLAGYL) 50 mg/ml oral suspension 500 mg  Status:  Discontinued     500 mg Per Tube 3 times daily 12/02/16 1036 12/04/16 1107   12/02/16 1015  metroNIDAZOLE (FLAGYL) 50 mg/ml oral suspension 250 mg  Status:  Discontinued  250 mg Per Tube 4 times daily 12/02/16 1011 12/02/16 1036   11/28/16 0800  fluconazole (DIFLUCAN) IVPB 200 mg  Status:  Discontinued     200 mg 100 mL/hr over 60 Minutes Intravenous Every 24 hours 11/27/16 0624 12/02/16 1018   11/27/16 0630  fluconazole (DIFLUCAN) IVPB 400 mg     400 mg 100 mL/hr over 120 Minutes Intravenous  Once 11/27/16 0622 11/27/16 0841   11/27/16 0600  piperacillin-tazobactam (ZOSYN) IVPB 3.375 g  Status:  Discontinued     3.375 g 12.5 mL/hr over 240 Minutes Intravenous Every 8 hours 11/27/16 0534 12/04/16 1339   11/27/16 0515  piperacillin-tazobactam (ZOSYN) IVPB 3.375 g  Status:  Discontinued      3.375 g 100 mL/hr over 30 Minutes Intravenous  Once 11/27/16 0510 11/27/16 0518   11/27/16 0030  piperacillin-tazobactam (ZOSYN) IVPB 3.375 g     3.375 g 100 mL/hr over 30 Minutes Intravenous  Once 11/27/16 0016 11/27/16 0755   11/27/16 0030  vancomycin (VANCOCIN) IVPB 1000 mg/200 mL premix     1,000 mg 200 mL/hr over 60 Minutes Intravenous  Once 11/27/16 0016 11/27/16 0756      Assessment/Plan: Perforated pyloric ulcer  POD#13S/P EXPLORATORY LAPAROTOMY, GRAHAM PATCH REPAIR8/10/18 Dr. Ovidio Kin - tube feeds atgoal rate, continue - continue BID wet to dry dressing changes, may place vac at some point, this wound is certainly at risk and needs to be monitoried - H. pylori IgG elevated. continue IV Protonix  Malnutrition:continue tube feeds, can assess swallowing and advance diet as indicated ID: leukocytosis resolved.  VTE: SCD's, heparin gtt  [facility] 12/11/2016

## 2016-12-12 DIAGNOSIS — I639 Cerebral infarction, unspecified: Secondary | ICD-10-CM

## 2016-12-12 DIAGNOSIS — B9681 Helicobacter pylori [H. pylori] as the cause of diseases classified elsewhere: Secondary | ICD-10-CM

## 2016-12-12 DIAGNOSIS — I480 Paroxysmal atrial fibrillation: Secondary | ICD-10-CM

## 2016-12-12 DIAGNOSIS — I749 Embolism and thrombosis of unspecified artery: Secondary | ICD-10-CM

## 2016-12-12 DIAGNOSIS — K279 Peptic ulcer, site unspecified, unspecified as acute or chronic, without hemorrhage or perforation: Secondary | ICD-10-CM

## 2016-12-12 DIAGNOSIS — K760 Fatty (change of) liver, not elsewhere classified: Secondary | ICD-10-CM

## 2016-12-12 DIAGNOSIS — E119 Type 2 diabetes mellitus without complications: Secondary | ICD-10-CM

## 2016-12-12 LAB — CBC WITH DIFFERENTIAL/PLATELET
BASOS PCT: 0 %
Basophils Absolute: 0 10*3/uL (ref 0.0–0.1)
EOS ABS: 0.1 10*3/uL (ref 0.0–0.7)
EOS PCT: 1 %
HCT: 33.8 % — ABNORMAL LOW (ref 39.0–52.0)
Hemoglobin: 10.2 g/dL — ABNORMAL LOW (ref 13.0–17.0)
Lymphocytes Relative: 15 %
Lymphs Abs: 1.3 10*3/uL (ref 0.7–4.0)
MCH: 32.9 pg (ref 26.0–34.0)
MCHC: 30.2 g/dL (ref 30.0–36.0)
MCV: 109 fL — ABNORMAL HIGH (ref 78.0–100.0)
Monocytes Absolute: 0.5 10*3/uL (ref 0.1–1.0)
Monocytes Relative: 6 %
Neutro Abs: 6.8 10*3/uL (ref 1.7–7.7)
Neutrophils Relative %: 78 %
PLATELETS: 456 10*3/uL — AB (ref 150–400)
RBC: 3.1 MIL/uL — AB (ref 4.22–5.81)
RDW: 16.1 % — ABNORMAL HIGH (ref 11.5–15.5)
WBC: 8.7 10*3/uL (ref 4.0–10.5)

## 2016-12-12 LAB — GLUCOSE, CAPILLARY
GLUCOSE-CAPILLARY: 128 mg/dL — AB (ref 65–99)
GLUCOSE-CAPILLARY: 137 mg/dL — AB (ref 65–99)
GLUCOSE-CAPILLARY: 146 mg/dL — AB (ref 65–99)
GLUCOSE-CAPILLARY: 157 mg/dL — AB (ref 65–99)
Glucose-Capillary: 129 mg/dL — ABNORMAL HIGH (ref 65–99)
Glucose-Capillary: 259 mg/dL — ABNORMAL HIGH (ref 65–99)
Glucose-Capillary: 167 mg/dL — ABNORMAL HIGH (ref 65–99)

## 2016-12-12 LAB — BASIC METABOLIC PANEL
Anion gap: 8 (ref 5–15)
BUN: 44 mg/dL — ABNORMAL HIGH (ref 6–20)
CALCIUM: 8.5 mg/dL — AB (ref 8.9–10.3)
CO2: 28 mmol/L (ref 22–32)
CREATININE: 0.95 mg/dL (ref 0.61–1.24)
Chloride: 117 mmol/L — ABNORMAL HIGH (ref 101–111)
GFR calc non Af Amer: >60 mL/min (ref >60)
Glucose, Bld: 125 mg/dL — ABNORMAL HIGH (ref 65–99)
Potassium: 3.3 mmol/L — ABNORMAL LOW (ref 3.5–5.1)
SODIUM: 153 mmol/L — AB (ref 135–145)

## 2016-12-12 LAB — MAGNESIUM: MAGNESIUM: 2 mg/dL (ref 1.7–2.4)

## 2016-12-12 LAB — HEPARIN LEVEL (UNFRACTIONATED): Heparin Unfractionated: 0.69 IU/mL (ref 0.30–0.70)

## 2016-12-12 LAB — PHOSPHORUS: PHOSPHORUS: 2.9 mg/dL (ref 2.5–4.6)

## 2016-12-12 MED ORDER — METOPROLOL TARTRATE 5 MG/5ML IV SOLN
2.5000 mg | INTRAVENOUS | Status: DC | PRN
  Administered 2016-12-12 (×2): 5 mg via INTRAVENOUS
  Filled 2016-12-12 (×2): qty 5

## 2016-12-12 MED ORDER — POTASSIUM CHLORIDE 20 MEQ/15ML (10%) PO SOLN
40.0000 meq | ORAL | Status: AC
Start: 2016-12-12 — End: 2016-12-12
  Administered 2016-12-12 (×2): 40 meq via ORAL
  Filled 2016-12-12 (×2): qty 30

## 2016-12-12 MED ORDER — PIPERACILLIN-TAZOBACTAM 3.375 G IVPB
3.3750 g | Freq: Three times a day (TID) | INTRAVENOUS | Status: DC
  Administered 2016-12-12 – 2016-12-13 (×2): 3.375 g via INTRAVENOUS
  Filled 2016-12-12 (×3): qty 50

## 2016-12-12 MED ORDER — IBUPROFEN 100 MG/5ML PO SUSP
400.0000 mg | Freq: Four times a day (QID) | ORAL | Status: DC | PRN
  Administered 2016-12-12: 400 mg via ORAL
  Filled 2016-12-12: qty 20

## 2016-12-12 MED ORDER — SODIUM CHLORIDE 0.9 % IV SOLN
3.0000 g | Freq: Four times a day (QID) | INTRAVENOUS | Status: DC
  Administered 2016-12-12: 3 g via INTRAVENOUS
  Filled 2016-12-12 (×4): qty 3

## 2016-12-12 MED ORDER — METOPROLOL TARTRATE 25 MG/10 ML ORAL SUSPENSION
25.0000 mg | Freq: Two times a day (BID) | ORAL | Status: DC
  Administered 2016-12-12 – 2016-12-19 (×15): 25 mg
  Filled 2016-12-12 (×15): qty 10

## 2016-12-12 MED ORDER — DILTIAZEM HCL 60 MG PO TABS
60.0000 mg | ORAL_TABLET | Freq: Three times a day (TID) | ORAL | Status: DC
  Administered 2016-12-12: 60 mg via ORAL
  Filled 2016-12-12: qty 1

## 2016-12-12 MED ORDER — PANTOPRAZOLE SODIUM 40 MG PO PACK
40.0000 mg | PACK | Freq: Two times a day (BID) | ORAL | Status: DC
  Administered 2016-12-12 – 2016-12-23 (×23): 40 mg
  Filled 2016-12-12 (×23): qty 20

## 2016-12-12 NOTE — Evaluation (Signed)
Physical Therapy Evaluation Patient Details Name: Jacob Pittman MRN: [id number] DOB: Aug 10, 1945 Today's Date: 12/12/2016   History of Present Illness  Patient is a 71 yo male admitted 11/26/16 with perforated duodenal bulb ulcer, now s/p exploratory lap and repair on 11/27/16. Postoperatively remained in shock requiring epinephrine and Levophed drip. Also complicated by ischemic RUE and is s/p embolectomy.  Patient was slow to wean from vent, extubated on 12/10/16.  Patient with confusion.     PMH:   ICM, DM, HTN, PAD, OSA, CHF, EF 15%, ICD, PAF, OA  Clinical Impression  Patient presents with problems listed below.  Will benefit from acute PT to maximize functional mobility prior to discharge.  Patient requiring +2 max assist for bed mobility today.  Recommend ST-SNF at d/c for continued therapy. a     Follow Up Recommendations SNF;Supervision/Assistance - 24 hour    Equipment Recommendations  Other (comment) (TBD)    Recommendations for Other Services       Precautions / Restrictions Precautions Precautions: Fall Precaution Comments: NG suction; feeding tube Restrictions Weight Bearing Restrictions: No      Mobility  Bed Mobility Overal bed mobility: Needs Assistance Bed Mobility: Supine to Sit;Sit to Supine     Supine to sit: Max assist;+2 for physical assistance Sit to supine: Max assist;+2 for physical assistance   General bed mobility comments: Patient required repeated verbal and tactile cues for mobility.  Increased time and +2 max assist to move to sitting.  Once upright, patient required mod assist to maintain sitting balance, with patient leaning to Rt side.  Worked on moving to midline in sitting.  Patient was able to maintain sitting balance with min guard assist for 20 seconds.  Patient with increased RR to 42 during sitting.  RN notified.  Returned to supine with +2 max assist.  Transfers                 General transfer comment: Unable  Ambulation/Gait                 Stairs            Wheelchair Mobility    Modified Rankin (Stroke Patients Only)       Balance Overall balance assessment: Needs assistance Sitting-balance support: Bilateral upper extremity supported;Feet supported Sitting balance-Leahy Scale: Poor Sitting balance - Comments: Required UE support and external assist to maintain sitting balance. Postural control: Right lateral lean                                   Pertinent Vitals/Pain Pain Assessment: No/denies pain    Home Living Family/patient expects to be discharged to:: Private residence Living Arrangements: Alone Available Help at Discharge: Family (Per chart, daughter will be with patient - ? hours)             Additional Comments: Patient unable to provide information.    Prior Function Level of Independence: Independent         Comments: Unsure of reliability of information     Hand Dominance        Extremity/Trunk Assessment   Upper Extremity Assessment Upper Extremity Assessment: Generalized weakness;Difficult to assess due to impaired cognition    Lower Extremity Assessment Lower Extremity Assessment: Generalized weakness;Difficult to assess due to impaired cognition       Communication      Cognition Arousal/Alertness: Lethargic Behavior During Therapy: Flat affect Overall Cognitive  Status: No family/caregiver present to determine baseline cognitive functioning                                 General Comments: Minimal verbalizations      General Comments      Exercises     Assessment/Plan    PT Assessment Patient needs continued PT services  PT Problem List Decreased strength;Decreased activity tolerance;Decreased balance;Decreased mobility;Decreased cognition;Decreased knowledge of use of DME;Decreased safety awareness;Cardiopulmonary status limiting activity       PT Treatment Interventions DME instruction;Gait  training;Functional mobility training;Therapeutic activities;Therapeutic exercise;Balance training;Cognitive remediation;Patient/family education    PT Goals (Current goals can be found in the Care Plan section)  Acute Rehab PT Goals Patient Stated Goal: Unable to state PT Goal Formulation: Patient unable to participate in goal setting Time For Goal Achievement: 12/26/16 Potential to Achieve Goals: Fair    Frequency Min 3X/week   Barriers to discharge        Co-evaluation               AM-PAC PT "6 Clicks" Daily Activity  Outcome Measure Difficulty turning over in bed (including adjusting bedclothes, sheets and blankets)?: Unable Difficulty moving from lying on back to sitting on the side of the bed? : Unable Difficulty sitting down on and standing up from a chair with arms (e.g., wheelchair, bedside commode, etc,.)?: Unable Help needed moving to and from a bed to chair (including a wheelchair)?: A Lot Help needed walking in hospital room?: A Lot Help needed climbing 3-5 steps with a railing? : Total 6 Click Score: 8    End of Session   Activity Tolerance: Patient limited by lethargy;Patient limited by fatigue Patient left: in bed;with call bell/phone within reach Nurse Communication: Mobility status (RR increased to 42 during sitting EOB.) PT Visit Diagnosis: Muscle weakness (generalized) (M62.81);Difficulty in walking, not elsewhere classified (R26.2)    Time: 1610-9604 PT Time Calculation (min) (ACUTE ONLY): 21 min   Charges:   PT Evaluation $PT Eval High Complexity: 1 High     PT G Codes:        Durenda Hurt. Renaldo Fiddler, [facility] Acute Rehab Services Pager [phone]   Vena Austria 12/12/2016, 8:54 PM

## 2016-12-12 NOTE — Progress Notes (Signed)
ANTICOAGULATION CONSULT NOTE - Follow-Up Consult  Pharmacy Consult for Heparin Indication: atrial fibrillation, ischemic limb  No Known Allergies  Patient Measurements: Height: 5\' 8"  (172.7 cm) Weight: 183 lb 14.4 oz (83.4 kg) IBW/kg (Calculated) : 68.4 Heparin Dosing Weight: 89.6 kg  Vital Signs: Temp: 100.3 F (37.9 C) (08/25 1138) Temp Source: Axillary (08/25 1138) BP: 118/87 (08/25 1100) Pulse Rate: 112 (08/25 1100)  Labs:  Recent Labs  12/10/16 0350 12/11/16 0305 12/12/16 0443  HGB 9.8* 9.9* 10.2*  HCT 31.2* 32.0* 33.8*  PLT 455* 471* 456*  HEPARINUNFRC 0.65 0.61 0.69  CREATININE 1.04 1.06 0.95   Estimated Creatinine Clearance: 72.9 mL/min (by C-G formula based on SCr of 0.95 mg/dL).  Assessment: 71 yo male admitted 8/9 with gastric ulcer perforation and peritonitis. Underwent emergent laparotomy with omental patch repair. Post op course complicated by multiorgan failure requiring intubation and pressors.   On 8/13, Pharmacy consulted to dose IV heparin for afib and presumed ischemic right upper extremity. Heparin held 8/13 secondary to bleeding at incision site, resumed on 8/16 after repeat CT negative for acute infarct and pt had cool extremity.   Heparin therapeutic at 0.69 units/mL today. CBC stable, no bleeding noted.  Goal of Therapy:  Heparin level 0.3-0.7 Monitor platelets by anticoagulation protocol: Yes   Plan:  -Continue heparin at  1550 units/hr -Daily  heparin level and CBC -Follow for s/s bleeding -Follow for long term anticoagulation plans  Bocephus Cali D. Davien Malone, PharmD, BCPS Clinical Pharmacist Pager: [phone] Clinical Phone for 12/12/2016 until 3:30pm: [phone] If after 3:30pm, please call main pharmacy at [phone] 12/12/2016 12:11 PM

## 2016-12-12 NOTE — Progress Notes (Signed)
Pharmacy Antibiotic Note  Jacob Pittman is a 71 y.o. male admitted on 11/26/2016 with intra-abdominal infection.  Pharmacy has been consulted for Unaysn dosing.  Patient with complicated course including perforated pyloric ulcer requiring ex lap and graham patch repair on 8/10. He is currently on triple therapy for HPylori with clarithromycin, amoxicillin and PPI all pert tube- last day of triple therapy is 12/16/16.  Now with tmax/24h 101.6.   Plan: Unasyn 3g IV q6h Follow renal function, clinical progression Continue clarithromycin 500mg  PT BID and Protonix 40mg  PT BID  Height: 5\' 8"  (172.7 cm) Weight: 183 lb 14.4 oz (83.4 kg) IBW/kg (Calculated) : 68.4  Temp (24hrs), Avg:99.4 F (37.4 C), Min:98.2 F (36.8 C), Max:101.6 F (38.7 C)   Recent Labs Lab 12/08/16 0419 12/09/16 0358 12/09/16 1710 12/09/16 1924 12/10/16 0350 12/11/16 0305 12/12/16 0443  WBC 9.0 8.0  --   --  9.8 10.0 8.7  CREATININE 1.06 1.15 0.98 1.09 1.04 1.06 0.95    Estimated Creatinine Clearance: 72.9 mL/min (by C-G formula based on SCr of 0.95 mg/dL).    No Known Allergies  Antimicrobials this admission: 8/10 Zosyn >> 8/22 8/10 Diflucan >>8/15 8/10 Vancomycin x1 8/15 Flagyl >>8/22 8/15 Biaxin >> 276 8/22 Amoxicillin>>8/25 8/25 Unasyn >>   Microbiology results: 8/10 BCx: negative 8/10 Abd wound: normal skin flora 8/10 MRSA PCR: neg 8/9 BCx: neg 8/9 UCx: > 100K E coli R to Unaysn/amp, sens to all others 8/10 BCx: Neg 8/17 C. Diff: negative 8/18 Resp Cx: normal flora 8/17 UCx: neg 8/17 BCx: neg 8/23 BCx: ngtd  Thank you for allowing pharmacy to be a part of this patient's care.  Juanna Pudlo D. Archibald Marchetta, PharmD, BCPS Clinical Pharmacist Pager: [phone] Clinical Phone for 12/12/2016 until 3:30pm: [phone] If after 3:30pm, please call main pharmacy at [phone] 12/12/2016 12:15 PM

## 2016-12-12 NOTE — Progress Notes (Signed)
Patient rested well through the night. Intermittent periods of afib RVR. At times up to 160 (non sustained) and usually down to 120-135 sustained. Called [facility] and rec'd order for Lopressor PRN. Pt did require 2 doses this shift for the same. TMAX 101.6, resolved after admin of tylenol.

## 2016-12-12 NOTE — Plan of Care (Signed)
Problem: Skin Integrity: Goal: Risk for impaired skin integrity will decrease Outcome: Progressing Pt has multiple skin tears and abrasions. Dressings changed per protocol, no imminent signs of infections noted

## 2016-12-12 NOTE — Progress Notes (Signed)
ANTIBIOTIC CONSULT NOTE - INITIAL  Pharmacy Consult for Zosyn Indication: Intra-abdominal infection   No Known Allergies  Patient Measurements: Height: 5\' 8"  (172.7 cm) Weight: 183 lb 14.4 oz (83.4 kg) IBW/kg (Calculated) : 68.4 Adjusted Body Weight:    Vital Signs: Temp: 102.7 F (39.3 C) (08/25 1733) Temp Source: Axillary (08/25 1138) BP: 110/94 (08/25 1700) Pulse Rate: 92 (08/25 1700) Intake/Output from previous day: 08/24 0701 - 08/25 0700 In: 1452 [I.V.:612; NG/GT:840] Out: 1500 [Urine:1450; Stool:50] Intake/Output from this shift: Total I/O In: 635 [I.V.:255; NG/GT:280; IV Piggyback:100] Out: 750 [Urine:550; Stool:200]  Labs:  Recent Labs  12/10/16 0350 12/11/16 0305 12/12/16 0443  WBC 9.8 10.0 8.7  HGB 9.8* 9.9* 10.2*  PLT 455* 471* 456*  CREATININE 1.04 1.06 0.95   Estimated Creatinine Clearance: 72.9 mL/min (by C-G formula based on SCr of 0.95 mg/dL). No results for input(s): VANCOTROUGH, VANCOPEAK, VANCORANDOM, GENTTROUGH, GENTPEAK, GENTRANDOM, TOBRATROUGH, TOBRAPEAK, TOBRARND, AMIKACINPEAK, AMIKACINTROU, AMIKACIN in the last 72 hours.   Microbiology:   Medical History: Past Medical History:  Diagnosis Date  . ANXIETY 02/04/2009  . CHF (congestive heart failure) (HCC)   . Chronic systolic CHF (congestive heart failure) (HCC)   . DIABETES MELLITUS, TYPE II 11/07/2008  . 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 HIP PAIN, RIGHT 11/06/2009  . HYPERLIPIDEMIA 11/07/2008  . HYPERTENSION 10/08/2008  . Nonischemic cardiomyopathy [facility])    s/p ICD implantation 03-2013 by Dr Ladona Ridgel  . OSTEOARTHRITIS, KNEE, RIGHT 10/08/2008  . Peripheral arterial disease (HCC)     Assessment: ID: IAI - diffuse peritonitis from perforated pyloric ulcer -8/10: exp lap with Cheree Ditto patch repair of perforated pyloric channel ulcer -WBC 8.7, intermittently febrile, tmax/24h 101.6 - 8/25 PM: RN notes pt still spiking fever 102.7, Glucose>250, tremors  Antimicrobials this  admission: 8/10 Zosyn >> 8/22, 8/25>> 8/10 Diflucan >>8/15 8/10 Vancomycin x1 8/15 Flagyl >>8/22 8/15 Biaxin >> (8/29) 8/22 Amoxicillin>>8/25 8/25 Unasyn >>8/25 -On BID PPI  Microbiology results: 8/10 BCx: negative 8/10 Abd wound: normal skin flora 8/10 MRSA PCR: neg 8/9 BCx: neg 8/9 UCx: > 100K E coli R to Unaysn/amp, sens to all others 8/10 BCx: Neg 8/17 C. Diff: negative 8/18 Resp Cx: normal flora 8/17 UCx: neg 8/17 BCx: neg 8/23 BCx: ngtd   Goal of Therapy:  Eradication of infection  Plan:  D/c Unasyn Zosyn 3.375g IV q8 hrs  Jacob Seguin S. Jacob Pittman, PharmD, BCPS Clinical Staff Pharmacist Pager [phone]  Jacob Pittman 12/12/2016,5:56 PM

## 2016-12-12 NOTE — Progress Notes (Signed)
dc'd old piv and cvc.  Placed new #20 angio cath x 2 to left hand.  Changed out all tubing and hung new iv fluids and heparin bag.  Pt's temp was higher at 103.1 oral.  Gave ibuprophen and ordered cooling blanket as well.  Updated dr. Butler Denmark via text.

## 2016-12-12 NOTE — Progress Notes (Signed)
SLP Cancellation Note  Patient Details Name: Jacob Pittman MRN: [id number] DOB: 1945-06-02   Cancelled treatment:       Reason Eval/Treat Not Completed: Fatigue/lethargy limiting ability to participate. SLP followed up with pt prior to completing MBS as RN reports lethargic this morning. Pt does wake up intermittently with cues and respond verbally to basic questions, however unable to sustain arousal sufficient for MBS at this time. SLP will f/u next date for instrumental readiness.  Rondel Baton, [place], CCC-SLP Speech-Language Pathologist [phone]   Arlana Lindau 12/12/2016, 10:04 AM

## 2016-12-12 NOTE — Progress Notes (Signed)
In last hour or so, pt has become more tachypneic.  Denies sob.  Denies pain.  Fever noted 102.7 after already being treated with Tylenol.  Glucose spiking to >250.  Tremors noted.  Notified Dr. Butler Denmark who orders zosyn, ibuprophen, and dc central line.

## 2016-12-12 NOTE — Progress Notes (Signed)
12 Days Post-Op    CC:  Abdomina pain  Subjective: Pt awake and off vent.  NG tube feeding going at 30 ml/hr.  He seems to be tolerating that well.  He is still confused, had trouble answering questions.  He is in [place], year:2000 something, gaze is to ceiling, not paying any attention to TV, no questions.   Open wound not healing well picture below  Objective: Vital signs in last 24 hours: Temp:  [98.7 F (37.1 C)-101.6 F (38.7 C)] 98.8 F (37.1 C) (08/25 0809) Pulse Rate:  [90-135] 108 (08/25 0809) Resp:  [20-34] 30 (08/25 0809) BP: (96-126)/(44-78) 117/77 (08/25 0809) SpO2:  [93 %-100 %] 100 % (08/25 0809) Weight:  [83.4 kg (183 lb 14.4 oz)-84.9 kg (187 lb 3.2 oz)] 83.4 kg (183 lb 14.4 oz) (08/25 0152) Last BM Date: 12/11/16 550 IV 767 Tube feed 1450 urine 50 stool TM 101.6 yesterday, 102.8 day prior to that.   Remains tachycardic - looks like AF no p waves seen on telem, some PVC's No films today    Intake/Output from previous day: 08/24 0701 - 08/25 0700 In: 1317 [I.V.:550; NG/GT:767] Out: 1500 [Urine:1450; Stool:50] Intake/Output this shift: Total I/O In: -  Out: 400 [Urine:200; Stool:200]  General appearance: alert, no distress and slowed mentation GI: soft few bS, tolerating TF, no BM recorded.  Open wound picture below. He had some dark fluid at the base where I rubbed it and you see some bleeding.       Lab Results:   Recent Labs  12/11/16 0305 12/12/16 0443  WBC 10.0 8.7  HGB 9.9* 10.2*  HCT 32.0* 33.8*  PLT 471* 456*    BMET  Recent Labs  12/11/16 0305 12/12/16 0443  NA 153* 153*  K 3.6 3.3*  CL 113* 117*  CO2 31 28  GLUCOSE 136* 125*  BUN 47* 44*  CREATININE 1.06 0.95  CALCIUM 8.5* 8.5*   PT/INR No results for input(s): LABPROT, INR in the last 72 hours.   Recent Labs Lab 12/06/16 0400 12/07/16 1800 12/08/16 0419 12/09/16 0358 12/10/16 0350  AST 45*  --   --   --   --   ALT 60  --   --   --   --   ALKPHOS 64  --    --   --   --   BILITOT 1.4*  --   --   --   --   PROT 5.0*  --   --   --   --   ALBUMIN 1.7* 1.7* 1.8* 1.8* 1.9*     Lipase     Component Value Date/Time   LIPASE 20 12/01/2016 0304     Medications: . amoxicillin  1,000 mg Per Tube Q12H  . aspirin  81 mg Per Tube Daily  . chlorhexidine gluconate (MEDLINE KIT)  15 mL Mouth Rinse BID  . Chlorhexidine Gluconate Cloth  6 each Topical Daily  . clarithromycin  500 mg Per Tube Q12H  . diltiazem  60 mg Oral Q8H  . feeding supplement (PRO-STAT SUGAR FREE 64)  60 mL Per Tube TID  . feeding supplement (VITAL HIGH PROTEIN)  1,000 mL Per Tube Q24H  . insulin aspart  0-20 Units Subcutaneous Q4H  . ipratropium-albuterol  3 mL Nebulization Q6H  . mouth rinse  15 mL Mouth Rinse QID  . pantoprazole (PROTONIX) IV  40 mg Intravenous Q12H  . pantoprazole sodium  40 mg Per Tube BID  . sodium chloride flush  10-40 mL Intracatheter Q12H   . sodium chloride 250 mL (12/11/16 0400)  . sodium chloride 500 mL (12/03/16 0126)  . heparin 1,550 Units/hr (12/12/16 0854)   Anti-infectives    Start     Dose/Rate Route Frequency Ordered Stop   12/10/16 0600  amoxicillin (AMOXIL) 250 MG/5ML suspension 1,000 mg     1,000 mg Per Tube Every 12 hours 12/09/16 1701 12/19/16 1759   12/09/16 1130  amoxicillin (AMOXIL) 250 MG/5ML suspension 1,000 mg  Status:  Discontinued     1,000 mg Oral Every 12 hours 12/09/16 1117 12/09/16 1118   12/09/16 1130  amoxicillin (AMOXIL) 250 MG/5ML suspension 1,000 mg  Status:  Discontinued     1,000 mg Per Tube Every 12 hours 12/09/16 1118 12/09/16 1701   12/04/16 2200  clarithromycin (BIAXIN) tablet 500 mg     500 mg Per Tube Every 12 hours 12/04/16 1107 12/18/16 2159   12/04/16 2000  piperacillin-tazobactam (ZOSYN) IVPB 3.375 g     3.375 g 12.5 mL/hr over 240 Minutes Intravenous Every 8 hours 12/04/16 1339 12/08/16 2359   12/04/16 1600  metroNIDAZOLE (FLAGYL) tablet 500 mg  Status:  Discontinued     500 mg Per Tube Every 8 hours  12/04/16 1107 12/09/16 1117   12/02/16 1100  clarithromycin (BIAXIN) 250 MG/5ML suspension 500 mg  Status:  Discontinued     500 mg Per Tube Every 12 hours 12/02/16 1012 12/04/16 1107   12/02/16 1045  metroNIDAZOLE (FLAGYL) 50 mg/ml oral suspension 500 mg  Status:  Discontinued     500 mg Per Tube 3 times daily 12/02/16 1036 12/04/16 1107   12/02/16 1015  metroNIDAZOLE (FLAGYL) 50 mg/ml oral suspension 250 mg  Status:  Discontinued     250 mg Per Tube 4 times daily 12/02/16 1011 12/02/16 1036   11/28/16 0800  fluconazole (DIFLUCAN) IVPB 200 mg  Status:  Discontinued     200 mg 100 mL/hr over 60 Minutes Intravenous Every 24 hours 11/27/16 0624 12/02/16 1018   11/27/16 0630  fluconazole (DIFLUCAN) IVPB 400 mg     400 mg 100 mL/hr over 120 Minutes Intravenous  Once 11/27/16 0622 11/27/16 0841   11/27/16 0600  piperacillin-tazobactam (ZOSYN) IVPB 3.375 g  Status:  Discontinued     3.375 g 12.5 mL/hr over 240 Minutes Intravenous Every 8 hours 11/27/16 0534 12/04/16 1339   11/27/16 0515  piperacillin-tazobactam (ZOSYN) IVPB 3.375 g  Status:  Discontinued     3.375 g 100 mL/hr over 30 Minutes Intravenous  Once 11/27/16 0510 11/27/16 0518   11/27/16 0030  piperacillin-tazobactam (ZOSYN) IVPB 3.375 g     3.375 g 100 mL/hr over 30 Minutes Intravenous  Once 11/27/16 0016 11/27/16 0755   11/27/16 0030  vancomycin (VANCOCIN) IVPB 1000 mg/200 mL premix     1,000 mg 200 mL/hr over 60 Minutes Intravenous  Once 11/27/16 0016 11/27/16 0756      Assessment/Plan Perforated pyloric ulcer  POD#14 S/P EXPLORATORY LAPAROTOMY, GRAHAM PATCH REPAIR8/10/18 Dr. Alphonsa Overall H Pylori Positive/C diff negative Shock - resolved Acute encephalopathy- prolong ventilator suppor Biventricular heart failure PAF ? CVA Leg ischemia Type II diabetes Acute renal failure - resolving FEN:  IV fluids/npo/tube feedings ID:  8/10 - 12/19/16  Amoxil 8/23-12/19/16 DVT:  SCD/heparin  Plan:  Slow improvement, tolerating TF.       LOS: 15 days    Leray Garverick 12/12/2016 [phone]

## 2016-12-12 NOTE — Progress Notes (Addendum)
PROGRESS NOTE    Jacob Pittman   LKJ:[id number]  DOB: 02-07-1946  DOA: 11/26/2016 PCP: Biagio Borg, MD   Brief Narrative:  Jacob Pittman 71 y/o male with EF 15% (ischemic CMP) s/p AICD, HTN, DM, PAD presented on 8/9 with abdominal pain, LA 9.4 and a bowel perforation. Was found to have a perforated pyloric channel ulcer and pneumoperitonitis s/p omental patch repair subsequently on epinephrine and Levophed.  8/12-  TTE LV moderately dilated with EF 15% &diffuse hypokinesis. There is akinesis of the inferolateral and inferior myocardium.  E coli UTI noted 8/13-  right brachial A- line placed and then developed ischemic right arm- vascular consult and transfer from [facility] to [facility]- thrombectomy of brachial artery- heparin Paroxysmal A-fib also noted 8/14- Decreased attenuation involving the right dentate nucleus in the right cerebellum and immediately adjacent cerebellar parenchyma, concerning for recent and potentially acute infarct in this area.  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 No complaints.  ROS: no complaints of nausea, vomiting, constipation diarrhea, cough, dyspnea or dysuria. No other complaints.   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   Nonischemic cardiomyopathy  AICD - Ef 15 %, RV failure, mod pulm HTN- follow with Dr Lovena Le - he was fluid overloaded and has been diuresed about 7 L and so will hold off on giving him free water to bring his sodium level down- follow     Acute respiratory failure with hypoxia  - in relation to sepsis- extubated and stable     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 - on baby aspirin and Heparin    Arterial thrombosis - right brachial occurring after A-line - s/p thrombectomy- staples intact    Fatty infiltration of liver -  Currently weight and diabetes appear controlled    DM (diabetes mellitus), type 2  - A1c 5.8 on 8/10 - on Metformin at home   DVT prophylaxis: heparin Code Status: Full code Family Communication:  Disposition Plan: possible SNF Consultants:   Neurology  PCCM  Vascular surgery  Gen surgery  Procedures  8/10- ex lap- patch repair of pyloric ucler  8/13- brachial embolectomy 2 D ECHO Left ventricle: The cavity size was moderately dilated. Wall   thickness was normal. Systolic function was severely reduced. The   estimated ejection fraction was 15%. Diffuse hypokinesis. There   is akinesis of the inferolateral and inferior myocardium. The   study is not technically sufficient to allow evaluation of LV   diastolic function. - Aortic valve: There was mild regurgitation. - Mitral valve: There was moderate regurgitation. -  Left atrium: The atrium was severely dilated. - Right ventricle: The cavity size was moderately dilated. Systolic   function was moderately reduced. - Right atrium: The atrium was severely dilated. - Tricuspid valve: There was moderate regurgitation. - Pulmonary arteries: Systolic pressure was moderately increased.  Antimicrobials:  Anti-infectives    Start      Dose/Rate Route Frequency Ordered Stop   12/10/16 0600  amoxicillin (AMOXIL) 250 MG/5ML suspension 1,000 mg     1,000 mg Per Tube Every 12 hours 12/09/16 1701 12/19/16 1759   12/09/16 1130  amoxicillin (AMOXIL) 250 MG/5ML suspension 1,000 mg  Status:  Discontinued     1,000 mg Oral Every 12 hours 12/09/16 1117 12/09/16 1118   12/09/16 1130  amoxicillin (AMOXIL) 250 MG/5ML suspension 1,000 mg  Status:  Discontinued     1,000 mg Per Tube Every 12 hours 12/09/16 1118 12/09/16 1701   12/04/16 2200  clarithromycin (BIAXIN) tablet 500 mg     500 mg Per Tube Every 12 hours 12/04/16 1107 12/18/16 2159   12/04/16 2000  piperacillin-tazobactam (ZOSYN) IVPB 3.375 g     3.375 g 12.5 mL/hr over 240 Minutes Intravenous Every 8 hours 12/04/16 1339 12/08/16 2359   12/04/16 1600  metroNIDAZOLE (FLAGYL) tablet 500 mg  Status:  Discontinued     500 mg Per Tube Every 8 hours 12/04/16 1107 12/09/16 1117   12/02/16 1100  clarithromycin (BIAXIN) 250 MG/5ML suspension 500 mg  Status:  Discontinued     500 mg Per Tube Every 12 hours 12/02/16 1012 12/04/16 1107   12/02/16 1045  metroNIDAZOLE (FLAGYL) 50 mg/ml oral suspension 500 mg  Status:  Discontinued     500 mg Per Tube 3 times daily 12/02/16 1036 12/04/16 1107   12/02/16 1015  metroNIDAZOLE (FLAGYL) 50 mg/ml oral suspension 250 mg  Status:  Discontinued     250 mg Per Tube 4 times daily 12/02/16 1011 12/02/16 1036   11/28/16 0800  fluconazole (DIFLUCAN) IVPB 200 mg  Status:  Discontinued     200 mg 100 mL/hr over 60 Minutes Intravenous Every 24 hours 11/27/16 0624 12/02/16 1018   11/27/16 0630  fluconazole (DIFLUCAN) IVPB 400 mg     400 mg 100 mL/hr over 120 Minutes Intravenous  Once 11/27/16 0622 11/27/16 0841   11/27/16 0600  piperacillin-tazobactam (ZOSYN) IVPB 3.375 g  Status:  Discontinued     3.375 g 12.5 mL/hr over 240 Minutes Intravenous Every 8 hours 11/27/16 0534 12/04/16 1339   11/27/16 0515  piperacillin-tazobactam (ZOSYN) IVPB 3.375 g   Status:  Discontinued     3.375 g 100 mL/hr over 30 Minutes Intravenous  Once 11/27/16 0510 11/27/16 0518   11/27/16 0030  piperacillin-tazobactam (ZOSYN) IVPB 3.375 g     3.375 g 100 mL/hr over 30 Minutes Intravenous  Once 11/27/16 0016 11/27/16 0755   11/27/16 0030  vancomycin (VANCOCIN) IVPB 1000 mg/200 mL premix     1,000 mg 200 mL/hr over 60 Minutes Intravenous  Once 11/27/16 0016 11/27/16 0756       Objective: Vitals:   12/12/16 0300 12/12/16 0431 12/12/16 0500 12/12/16 0556  BP: 97/70 (!) 106/44 (!) 126/49   Pulse: (!) 111 94 92 (!) 135  Resp: (!) 34 (!) 32 (!) 30 (!) 31  Temp:  (!) 101.6 F (38.7 C)  99 F (37.2 C)  TempSrc:  Oral    SpO2: 96% 95% 94% 97%  Weight:      Height:        Intake/Output  Summary (Last 24 hours) at 12/12/16 0802 Last data filed at 12/12/16 0434  Gross per 24 hour  Intake          1261.45 ml  Output             1250 ml  Net            11.45 ml   Filed Weights   12/11/16 0300 12/11/16 2135 12/12/16 0152  Weight: 85.5 kg (188 lb 7.9 oz) 84.9 kg (187 lb 3.2 oz) 83.4 kg (183 lb 14.4 oz)    Examination: General exam: Appears comfortable  HEENT: PERRLA, oral mucosa moist, no sclera icterus or thrush Respiratory system: Clear to auscultation. Respiratory effort normal. Cardiovascular system: S1 & S2 heard, RRR.  No murmurs  Gastrointestinal system: Abdomen soft, non-tender, nondistended. Normal bowel sound. No organomegaly- NG tube intact- liquid stool in rectal tube Central nervous system: Alert - having trouble lifting extremities- CN 2-12 intact Extremities: No cyanosis, clubbing - + 2 pitting edema in left arm Skin: staples on right arm are clean Psychiatry:  Mood & affect appropriate.   Data Reviewed: I have personally reviewed following labs and imaging studies  CBC:  Recent Labs Lab 12/08/16 0419 12/09/16 0358 12/10/16 0350 12/11/16 0305 12/12/16 0443  WBC 9.0 8.0 9.8 10.0 8.7  NEUTROABS 7.3 6.2 7.7 7.9* 6.8  HGB 9.8*  9.5* 9.8* 9.9* 10.2*  HCT 29.8* 29.6* 31.2* 32.0* 33.8*  MCV 100.7* 104.6* 105.8* 107.7* 109.0*  PLT 381 419* 455* 471* 389*   Basic Metabolic Panel:  Recent Labs Lab 12/07/16 1800 12/08/16 0419 12/09/16 0358 12/09/16 1710 12/09/16 1924 12/10/16 0350 12/11/16 0305 12/12/16 0443  NA 144 146* 149* 150* 149* 148* 153* 153*  K 3.4* 3.2* 3.4* 3.9 3.8 3.6 3.6 3.3*  CL 115* 114* 115* 117* 115* 114* 113* 117*  CO2 _0 GLUCOSE 143* 111* 99 157* 128* 116* 136* 125*  BUN 34* 38* 41* 39* 41* 43* 47* 44*  CREATININE 1.00 1.06 1.15 0.98 1.09 1.04 1.06 0.95  CALCIUM 7.9* 8.0* 8.1* 8.2* 8.3* 8.2* 8.5* 8.5*  MG  --  1.7 1.8  --  2.1 2.1 2.0 2.0  PHOS 2.2* 3.1 3.5  --   --  3.0  --  2.9   GFR: Estimated Creatinine Clearance: 72.9 mL/min (by C-G formula based on SCr of 0.95 mg/dL). Liver Function Tests:  Recent Labs Lab 12/06/16 0400 12/07/16 1800 12/08/16 0419 12/09/16 0358 12/10/16 0350  AST 45*  --   --   --   --   ALT 60  --   --   --   --   ALKPHOS 64  --   --   --   --   BILITOT 1.4*  --   --   --   --   PROT 5.0*  --   --   --   --   ALBUMIN 1.7* 1.7* 1.8* 1.8* 1.9*   No results for input(s): LIPASE, AMYLASE in the last 168 hours. No results for input(s): AMMONIA in the last 168 hours. Coagulation Profile: No results for input(s): INR, PROTIME in the last 168 hours. Cardiac Enzymes:  Recent Labs Lab 12/05/16 1024 12/05/16 1630 12/05/16 2206  TROPONINI 0.08* 0.11* 0.10*   BNP (last 3 results) No results for input(s): PROBNP in the last 8760 hours. HbA1C: No results for input(s): HGBA1C in the last 72 hours. CBG:  Recent Labs Lab 12/11/16 1205 12/11/16 1606  12/11/16 2027 12/11/16 2348 12/12/16 0429  GLUCAP 116* 157* 118* 164* 129*   Lipid Profile: No results for input(s): CHOL, HDL, LDLCALC, TRIG, CHOLHDL, LDLDIRECT in the last 72 hours. Thyroid Function Tests: No results for input(s): TSH, T4TOTAL, FREET4, T3FREE, THYROIDAB in the  last 72 hours. Anemia Panel: No results for input(s): VITAMINB12, FOLATE, FERRITIN, TIBC, IRON, RETICCTPCT in the last 72 hours. Urine analysis:    Component Value Date/Time   COLORURINE YELLOW 09/10/2016 1002   APPEARANCEUR CLEAR 09/10/2016 1002   LABSPEC 1.025 09/10/2016 1002   PHURINE 6.5 09/10/2016 1002   GLUCOSEU NEGATIVE 09/10/2016 1002   HGBUR NEGATIVE 09/10/2016 1002   BILIRUBINUR SMALL (A) 09/10/2016 1002   KETONESUR NEGATIVE 09/10/2016 1002   PROTEINUR NEGATIVE 06/10/2010 0954   UROBILINOGEN >=8.0 (A) 09/10/2016 1002   NITRITE NEGATIVE 09/10/2016 1002   LEUKOCYTESUR NEGATIVE 09/10/2016 1002   Sepsis Labs: @LABRCNTIP (procalcitonin:4,lacticidven:4) ) Recent Results (from the past 240 hour(s))  C difficile quick scan w PCR reflex     Status: None   Collection Time: 12/04/16 11:07 AM  Result Value Ref Range Status   C Diff antigen NEGATIVE NEGATIVE Final   C Diff toxin NEGATIVE NEGATIVE Final   C Diff interpretation No C. difficile detected.  Final  Culture, Urine     Status: None   Collection Time: 12/04/16  7:32 PM  Result Value Ref Range Status   Specimen Description Urine  Final   Special Requests NONE  Final   Culture NO GROWTH  Final   Report Status 12/06/2016 FINAL  Final  Culture, blood (routine x 2)     Status: None   Collection Time: 12/04/16  8:48 PM  Result Value Ref Range Status   Specimen Description BLOOD LEFT HAND  Final   Special Requests   Final    BOTTLES DRAWN AEROBIC ONLY Blood Culture adequate volume   Culture NO GROWTH 5 DAYS  Final   Report Status 12/10/2016 FINAL  Final  Culture, blood (routine x 2)     Status: None   Collection Time: 12/04/16  9:01 PM  Result Value Ref Range Status   Specimen Description BLOOD RIGHT HAND  Final   Special Requests   Final    BOTTLES DRAWN AEROBIC ONLY Blood Culture adequate volume   Culture NO GROWTH 5 DAYS  Final   Report Status 12/10/2016 FINAL  Final  Culture, respiratory (NON-Expectorated)      Status: None   Collection Time: 12/05/16 12:30 AM  Result Value Ref Range Status   Specimen Description TRACHEAL ASPIRATE  Final   Special Requests NONE  Final   Gram Stain   Final    RARE WBC PRESENT,BOTH PMN AND MONONUCLEAR NO ORGANISMS SEEN    Culture NO GROWTH 2 DAYS  Final   Report Status 12/07/2016 FINAL  Final  Culture, Urine     Status: None   Collection Time: 12/10/16 11:58 AM  Result Value Ref Range Status   Specimen Description URINE, RANDOM  Final   Special Requests NONE  Final   Culture NO GROWTH  Final   Report Status 12/11/2016 FINAL  Final  Culture, blood (routine x 2)     Status: None (Preliminary result)   Collection Time: 12/10/16 12:39 PM  Result Value Ref Range Status   Specimen Description BLOOD RIGHT HAND  Final   Special Requests IN PEDIATRIC BOTTLE Blood Culture adequate volume  Final   Culture NO GROWTH 1 DAY  Final   Report Status PENDING  Incomplete  Culture, blood (routine x 2)     Status: None (Preliminary result)   Collection Time: 12/10/16 12:39 PM  Result Value Ref Range Status   Specimen Description BLOOD RIGHT HAND  Final   Special Requests IN PEDIATRIC BOTTLE Blood Culture adequate volume  Final   Culture NO GROWTH 1 DAY  Final   Report Status PENDING  Incomplete         Radiology Studies: No results found.    Scheduled Meds: . amoxicillin  1,000 mg Per Tube Q12H  . aspirin  81 mg Per Tube Daily  . chlorhexidine gluconate (MEDLINE KIT)  15 mL Mouth Rinse BID  . Chlorhexidine Gluconate Cloth  6 each Topical Daily  . clarithromycin  500 mg Per Tube Q12H  . feeding supplement (PRO-STAT SUGAR FREE 64)  60 mL Per Tube TID  . feeding supplement (VITAL HIGH PROTEIN)  1,000 mL Per Tube Q24H  . insulin aspart  0-20 Units Subcutaneous Q4H  . ipratropium-albuterol  3 mL Nebulization Q6H  . mouth rinse  15 mL Mouth Rinse QID  . pantoprazole (PROTONIX) IV  40 mg Intravenous Q12H  . sodium chloride flush  10-40 mL Intracatheter Q12H    Continuous Infusions: . sodium chloride 250 mL (12/11/16 0400)  . sodium chloride 500 mL (12/03/16 0126)  . heparin 1,550 Units/hr (12/11/16 1516)     LOS: 15 days    Time spent in minutes: [place], MD [facility] Pager: [email or web address] Password [email or web address] 12/12/2016, 8:02 AM

## 2016-12-13 ENCOUNTER — Inpatient Hospital Stay (HOSPITAL_COMMUNITY): Payer: Medicare PPO

## 2016-12-13 LAB — CBC WITH DIFFERENTIAL/PLATELET
BASOS ABS: 0 10*3/uL (ref 0.0–0.1)
BASOS PCT: 0 %
EOS ABS: 0.1 10*3/uL (ref 0.0–0.7)
Eosinophils Relative: 1 %
HEMATOCRIT: 36.4 % — AB (ref 39.0–52.0)
HEMOGLOBIN: 10.9 g/dL — AB (ref 13.0–17.0)
Lymphocytes Relative: 9 %
Lymphs Abs: 0.8 10*3/uL (ref 0.7–4.0)
MCH: 32.8 pg (ref 26.0–34.0)
MCHC: 29.9 g/dL — AB (ref 30.0–36.0)
MCV: 109.6 fL — ABNORMAL HIGH (ref 78.0–100.0)
Monocytes Absolute: 0.3 10*3/uL (ref 0.1–1.0)
Monocytes Relative: 4 %
NEUTROS ABS: 8.2 10*3/uL — AB (ref 1.7–7.7)
NEUTROS PCT: 86 %
Platelets: 436 10*3/uL — ABNORMAL HIGH (ref 150–400)
RBC: 3.32 MIL/uL — ABNORMAL LOW (ref 4.22–5.81)
RDW: 15.8 % — ABNORMAL HIGH (ref 11.5–15.5)
WBC: 9.4 10*3/uL (ref 4.0–10.5)

## 2016-12-13 LAB — GLUCOSE, CAPILLARY
GLUCOSE-CAPILLARY: 105 mg/dL — AB (ref 65–99)
GLUCOSE-CAPILLARY: 182 mg/dL — AB (ref 65–99)
GLUCOSE-CAPILLARY: 132 mg/dL — AB (ref 65–99)
GLUCOSE-CAPILLARY: 170 mg/dL — AB (ref 65–99)
Glucose-Capillary: 125 mg/dL — ABNORMAL HIGH (ref 65–99)

## 2016-12-13 LAB — BASIC METABOLIC PANEL
ANION GAP: 8 (ref 5–15)
BUN: 51 mg/dL — AB (ref 6–20)
CO2: 25 mmol/L (ref 22–32)
Calcium: 8.7 mg/dL — ABNORMAL LOW (ref 8.9–10.3)
Chloride: 121 mmol/L — ABNORMAL HIGH (ref 101–111)
Creatinine, Ser: 0.99 mg/dL (ref 0.61–1.24)
GFR calc Af Amer: >60 mL/min (ref >60)
GFR calc non Af Amer: >60 mL/min (ref >60)
GLUCOSE: 177 mg/dL — AB (ref 65–99)
POTASSIUM: 4 mmol/L (ref 3.5–5.1)
SODIUM: 154 mmol/L — AB (ref 135–145)

## 2016-12-13 LAB — MAGNESIUM: MAGNESIUM: 2.1 mg/dL (ref 1.7–2.4)

## 2016-12-13 LAB — HEPARIN LEVEL (UNFRACTIONATED)

## 2016-12-13 MED ORDER — RIVAROXABAN 20 MG PO TABS
20.0000 mg | ORAL_TABLET | Freq: Every day | ORAL | Status: DC
  Administered 2016-12-13 – 2016-12-21 (×9): 20 mg
  Filled 2016-12-13 (×10): qty 1

## 2016-12-13 MED ORDER — IOPAMIDOL (ISOVUE-300) INJECTION 61%
INTRAVENOUS | Status: AC
  Administered 2016-12-13: 100 mL via INTRAVENOUS
  Filled 2016-12-13: qty 100

## 2016-12-13 MED ORDER — POTASSIUM CHLORIDE 10 MEQ/100ML IV SOLN
10.0000 meq | INTRAVENOUS | Status: AC
  Administered 2016-12-13 (×2): 10 meq via INTRAVENOUS
  Filled 2016-12-13: qty 100

## 2016-12-13 MED ORDER — AMOXICILLIN 250 MG/5ML PO SUSR
1000.0000 mg | Freq: Two times a day (BID) | ORAL | Status: AC
  Administered 2016-12-13 – 2016-12-16 (×8): 1000 mg
  Filled 2016-12-13 (×9): qty 20

## 2016-12-13 MED ORDER — IOPAMIDOL (ISOVUE-300) INJECTION 61%
INTRAVENOUS | Status: AC
  Administered 2016-12-13: 30 mL
  Filled 2016-12-13: qty 50

## 2016-12-13 NOTE — Progress Notes (Signed)
Modified Barium Swallow Progress Note  Patient Details  Name: Jacob Pittman MRN: [id number] Date of Birth: 03-31-1946  Today's Date: 12/13/2016  Modified Barium Swallow completed.  Full report located under Chart Review in the Imaging Section.  Brief recommendations include the following:  Clinical Impression  Patient presents with severe risk for aspiration in the context of physical deconditioning after prolonged intubation. MBS limited due to pt's severe aspiration risk. Secretions appear to be thick, coating pt's posterior pharynx and laryngeal vestibule, visible via movement as pt breathing, coughing. SLP performed oral care and suctioning. With pureed solid, pt with prolonged oral phase, weak manipulation and delayed oral transit, initiating a weak pharyngeal swallow with moderately reduced tongue base retraction, minimal hyolaryngeal excursion, absent epiglottic deflection and reduced pharyngeal constriction. This led to retention of 100% of the bolus in vallecue. SLP cued pt for dry swallows, chin tuck, in an effort to facilitate bolus clearance, which were ineffective. As contrast pooled with pt's secretions, material spilled over pt's epiglottis and into the laryngeal vestibule. Pt intermittently sensed penetration/aspiration, though reflexive and cued cough not effective for airway clearance. Recommend pt remain NPO, with ice chips after oral care to facilitate use of swallowing musculature. Patient may benefit from RMT to strengthen cough, swallowing musculature. Will follow up for therapeutic exercise.    Swallow Evaluation Recommendations       SLP Diet Recommendations: NPO;Ice chips PRN after oral care;Alternative means - temporary       Medication Administration: Via alternative means               Oral Care Recommendations: Oral care QID;Other (Comment) (oral care prior to ice chip)   Other Recommendations: Have oral suction available;Remove water pitcher  Rondel Baton, [place], [employer] Speech-Language Pathologist [phone]  Arlana Lindau 12/13/2016,4:32 PM

## 2016-12-13 NOTE — Progress Notes (Signed)
ANTIBIOTIC CONSULT NOTE - Follow-Up  Pharmacy Consult for amoxicillin dosing Indication: Intra-abdominal infection (suspected H. Pylori)  No Known Allergies  Patient Measurements: Height: 5\' 8"  (172.7 cm) Weight: 186 lb 12.8 oz (84.7 kg) IBW/kg (Calculated) : 68.4 Adjusted Body Weight:    Vital Signs: Temp: 98.7 F (37.1 C) (08/26 0736) Temp Source: Core (Comment) (08/26 0736) BP: 98/79 (08/26 0736) Pulse Rate: 125 (08/26 0736) Intake/Output from previous day: 08/25 0701 - 08/26 0700 In: 1005 [I.V.:265; NG/GT:640; IV Piggyback:100] Out: 1325 [Urine:1125; Stool:200] Intake/Output from this shift: No intake/output data recorded.  Labs:  Recent Labs  12/11/16 0305 12/12/16 0443  WBC 10.0 8.7  HGB 9.9* 10.2*  PLT 471* 456*  CREATININE 1.06 0.95   Estimated Creatinine Clearance: 73.4 mL/min (by C-G formula based on SCr of 0.95 mg/dL). No results for input(s): VANCOTROUGH, VANCOPEAK, VANCORANDOM, GENTTROUGH, GENTPEAK, GENTRANDOM, TOBRATROUGH, TOBRAPEAK, TOBRARND, AMIKACINPEAK, AMIKACINTROU, AMIKACIN in the last 72 hours.   Microbiology:   Medical History: Past Medical History:  Diagnosis Date  . ANXIETY 02/04/2009  . CHF (congestive heart failure) (HCC)   . Chronic systolic CHF (congestive heart failure) (HCC)   . DIABETES MELLITUS, TYPE II 11/07/2008  . 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 HIP PAIN, RIGHT 11/06/2009  . HYPERLIPIDEMIA 11/07/2008  . HYPERTENSION 10/08/2008  . Nonischemic cardiomyopathy [facility])    s/p ICD implantation 03-2013 by Dr Ladona Ridgel  . OSTEOARTHRITIS, KNEE, RIGHT 10/08/2008  . Peripheral arterial disease (HCC)     Assessment: ID: IAI - diffuse peritonitis from perforated pyloric ulcer -8/10: exp lap with Cheree Ditto patch repair of perforated pyloric channel ulcer -WBC 8.7, intermittently febrile, tmax/24h 101.6 - 8/25 PM: RN notes pt still spiking fever 102.7, Glucose>250, tremors - 8/26 currently afeb, WBC 8.7, Scr 0.95  stable  Antimicrobials this admission: 8/10 Zosyn >> 8/22, 8/25>>8/26 8/10 Diflucan >>8/15 8/10 Vancomycin x1 8/15 Flagyl >>8/22 8/15 Clarithromycin >> (8/29) 8/22 Amoxicillin>>8/25, 8/26 >> 8/25 Unasyn >>8/25 -On BID PPI  Microbiology results: 8/10 BCx: negative 8/10 Abd wound: normal skin flora 8/10 MRSA PCR: neg 8/9 BCx: neg 8/9 UCx: > 100K E coli R to Unaysn/amp, sens to all others 8/10 BCx: Neg 8/17 C. Diff: negative 8/18 Resp Cx: normal flora 8/17 UCx: neg 8/17 BCx: neg 8/23 BCx: ngtd 8/23 UCx: ngtd   Goal of Therapy:  Eradication of infection  Plan:  Continue Clarithromycin 500 mg q12h, Amoxicillin 1000 mg q12h, and PPI for treatment of H. pylori  Ladell Pier, PharmD Pharmacy Resident Pager: [phone] 12/13/2016 9:45 AM

## 2016-12-13 NOTE — Progress Notes (Signed)
Patient safely transferred at 1835. Vitals taken and stable. Patient states he is not in any pain, all lines tubes and drains checked.

## 2016-12-13 NOTE — Progress Notes (Signed)
Cooling blanket discontinued.  Patient normothermic at 98.3 degrees Farenheit.  Will continue to monitor.

## 2016-12-13 NOTE — NC FL2 (Signed)
 MEDICAID FL2 LEVEL OF CARE SCREENING TOOL     IDENTIFICATION  Patient Name: Jacob Pittman Birthdate: 02/16/1946 Sex: male Admission Date (Current Location): 11/26/2016  County and Medicaid Number:  [place]   Facility and Address:  [facility], [address], [place], [place] [address]      Provider Number: [id number]  Attending Physician Name and Address:  Rizwan, Saima, MD  Relative Name and Phone Number:       Current Level of Care: Hospital Recommended Level of Care: Skilled Nursing Facility Prior Approval Number:    Date Approved/Denied:   PASRR Number: [id number]  Discharge Plan: SNF    Current Diagnoses: Patient Active Problem List   Diagnosis Date Noted  . Paroxysmal A-fib (HCC) 12/12/2016  . CVA (cerebral vascular accident) (HCC) 12/12/2016  . 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 H pylori ulcer 12/12/2016  . Fatty infiltration of liver 12/12/2016  . DM (diabetes mellitus), type 2 (HCC) 12/12/2016  . Acute respiratory failure with hypoxia (HCC)   . Pneumoperitoneum 11/27/2016  . Perforated duodenal bulb ulcer (HCC) 11/27/2016  . Elevated PSA 02/07/2016  . Contact dermatitis 07/23/2015  . History of colonic polyps 07/15/2015  . Venous stasis ulcer of left lower extremity (HCC) 01/21/2015  . Left arm swelling 01/03/2015  . Dyspnea 01/03/2015  . Irregular heart beats 01/03/2015  . Wheezing 07/19/2014  . Peripheral arterial disease (HCC) 10/31/2013  . Conjunctivitis 09/22/2013  . Allergic rhinitis 09/22/2013  . ICD (implantable cardioverter-defibrillator), single, in situ 07/12/2013  . Chronic combined systolic and diastolic heart failure, NYHA class 3 (HCC) 04/10/2013  . Nonischemic cardiomyopathy (HCC)   . Acute on chronic combined systolic and diastolic CHF (congestive heart failure) (HCC) 09/30/2012  . History of alcohol use 09/30/2012  . Back pain 10/08/2011  . 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  Preventative health care 08/17/2010  . HIP PAIN, RIGHT 11/06/2009  . ANXIETY 02/04/2009  . Diabetes (HCC) 11/07/2008  . Hyperlipidemia 11/07/2008  . 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 Essential hypertension 10/08/2008  . OSTEOARTHRITIS, KNEE, RIGHT 10/08/2008  . FATIGUE 10/08/2008    Orientation RESPIRATION BLADDER Height & Weight     Self  O2 (Nasal Cannula; 2L) External catheter (placed 8/23) Weight: 186 lb 12.8 oz (84.7 kg) Height:  5' 8" (172.7 cm)  BEHAVIORAL SYMPTOMS/MOOD NEUROLOGICAL BOWEL NUTRITION STATUS      Incontinent (Rectal pouch, placed 8/17)  (Please see d/c summary)  AMBULATORY STATUS COMMUNICATION OF NEEDS Skin   Extensive Assist Verbally Surgical wounds (Closed incision abdomen, ABD;Gauze (Comment);Moist to dry, changed twice a day. Closed incision right arm, no dressing. Open, Non pressure wound to abdomen, (xeroform) dressing, change dressing twice a day.)                       Personal Care Assistance Level of Assistance  Bathing, Feeding, Dressing Bathing Assistance: Maximum assistance Feeding assistance: Limited assistance Dressing Assistance: Maximum assistance     Functional Limitations Info  Sight, Hearing, Speech Sight Info: Adequate Hearing Info: Adequate Speech Info: Adequate (Whispers)    SPECIAL CARE FACTORS FREQUENCY  PT (By licensed PT), OT (By licensed OT)     PT Frequency: 3x week OT Frequency: 3x week            Contractures Contractures Info: Not present    Additional Factors Info  Code Status, Allergies Code Status Info: Full Code Allergies Info: No known allergies             Current Medications (12/13/2016):  This is the current hospital active medication list Current Facility-Administered Medications  Medication Dose Route Frequency Provider Last Rate Last Dose  . 0.9 %  sodium chloride infusion  250 mL Intravenous PRN Desai, Rahul P, PA-C 10 mL/hr at 12/13/16 0700 250 mL at 12/13/16 0700  . 0.9 %   sodium chloride infusion   Intravenous Continuous Babcock, Peter E, NP 10 mL/hr at 12/03/16 0126 500 mL at 12/03/16 0126  . acetaminophen (TYLENOL) solution 650 mg  650 mg Per Tube Q4H PRN Mannam, Praveen, MD   650 mg at 12/12/16 2142  . amoxicillin (AMOXIL) 250 MG/5ML suspension 1,000 mg  1,000 mg Per Tube Q12H Rizwan, Saima, MD      . artificial tears (LACRILUBE) ophthalmic ointment   Both Eyes Q3H PRN Hammonds, Kathleen H, MD      . aspirin chewable tablet 81 mg  81 mg Per Tube Daily Sommer, Steven E, MD   81 mg at 12/13/16 1000  . chlorhexidine gluconate (MEDLINE KIT) (PERIDEX) 0.12 % solution 15 mL  15 mL Mouth Rinse BID Mannam, Praveen, MD   15 mL at 12/12/16 2100  . Chlorhexidine Gluconate Cloth 2 % PADS 6 each  6 each Topical Daily Newman, David, MD   6 each at 12/13/16 1000  . clarithromycin (BIAXIN) tablet 500 mg  500 mg Per Tube Q12H Rizwan, Saima, MD   500 mg at 12/13/16 0912  . feeding supplement (PRO-STAT SUGAR FREE 64) liquid 60 mL  60 mL Per Tube TID Tsuei, Matthew, MD   60 mL at 12/13/16 0914  . feeding supplement (VITAL HIGH PROTEIN) liquid 1,000 mL  1,000 mL Per Tube Q24H Tsuei, Matthew, MD   1,000 mL at 12/13/16 0914  . insulin aspart (novoLOG) injection 0-20 Units  0-20 Units Subcutaneous Q4H Newman, David, MD   3 Units at 12/13/16 0507  . MEDLINE mouth rinse  15 mL Mouth Rinse QID Mannam, Praveen, MD   15 mL at 12/13/16 0508  . metoprolol tartrate (LOPRESSOR) 25 mg/10 mL oral suspension 25 mg  25 mg Per Tube BID Rizwan, Saima, MD   25 mg at 12/13/16 0916  . ondansetron (ZOFRAN-ODT) disintegrating tablet 4 mg  4 mg Oral Q6H PRN Newman, David, MD       Or  . ondansetron (ZOFRAN) injection 4 mg  4 mg Intravenous Q6H PRN Newman, David, MD      . pantoprazole sodium (PROTONIX) 40 mg/20 mL oral suspension 40 mg  40 mg Per Tube BID Rizwan, Saima, MD   40 mg at 12/13/16 0912  . rivaroxaban (XARELTO) tablet 20 mg  20 mg Per Tube Daily Rizwan, Saima, MD      . sodium chloride flush (NS)  0.9 % injection 10-40 mL  10-40 mL Intracatheter Q12H Newman, David, MD   10 mL at 12/13/16 0921  . sodium chloride flush (NS) 0.9 % injection 10-40 mL  10-40 mL Intracatheter PRN Newman, David, MD         Discharge Medications: Please see discharge summary for a list of discharge medications.  Relevant Imaging Results:  Relevant Lab Results:   Additional Information SSN: [id number]  Bridget A Cobb, LCSW    

## 2016-12-13 NOTE — Progress Notes (Signed)
13 Days Post-Op    CC:  Abdominal pain  Subjective: Still confused, doesn't speak much.  Doesn't complain.  Abdomen is still somewhat distended, he is spiking fevers and is on a cooling blanket.  He has a flexiseal in with 200 recorded from yesterday, apparently he was having diarrhea with the Tube feedings earlier.    Objective: Vital signs in last 24 hours: Temp:  [98.2 F (36.8 C)-103.1 F (39.5 C)] 98.7 F (37.1 C) (08/26 0736) Pulse Rate:  [64-125] 125 (08/26 0736) Resp:  [18-46] 30 (08/26 0736) BP: (92-128)/(54-94) 98/79 (08/26 0736) SpO2:  [92 %-100 %] 100 % (08/26 0754) Weight:  [84.7 kg (186 lb 12.8 oz)] 84.7 kg (186 lb 12.8 oz) (08/26 0426) Last BM Date: 12/13/16 (fexiseal) NPo NG TF -670 IV 500 Urine 1125 Stool x 200 ml Temp up to 103 yesterday and 101.6 at 1900 yesterday then afebrile.   BP down some   Na 153 yesterday, no labs today Intake/Output from previous day: 08/25 0701 - 08/26 0700 In: 1165 [I.V.:395; NG/GT:670; IV Piggyback:100] Out: 1325 [Urine:1125; Stool:200] Intake/Output this shift: Total I/O In: 90 [NG/GT:90] Out: -   General appearance: alert, slowed mentation and doesn't know where he is today, mentation has not improved.   Resp: just some course breath sounds anterior.  GI: distended, BS high pitched, flexiseal with 200 recorded.  Lab Results:   Recent Labs  12/11/16 0305 12/12/16 0443  WBC 10.0 8.7  HGB 9.9* 10.2*  HCT 32.0* 33.8*  PLT 471* 456*    BMET  Recent Labs  12/11/16 0305 12/12/16 0443  NA 153* 153*  K 3.6 3.3*  CL 113* 117*  CO2 31 28  GLUCOSE 136* 125*  BUN 47* 44*  CREATININE 1.06 0.95  CALCIUM 8.5* 8.5*   PT/INR No results for input(s): LABPROT, INR in the last 72 hours.   Recent Labs Lab 12/07/16 1800 12/08/16 0419 12/09/16 0358 12/10/16 0350  ALBUMIN 1.7* 1.8* 1.8* 1.9*     Lipase     Component Value Date/Time   LIPASE 20 12/01/2016 0304     Medications: . amoxicillin  1,000 mg Per  Tube Q12H  . aspirin  81 mg Per Tube Daily  . chlorhexidine gluconate (MEDLINE KIT)  15 mL Mouth Rinse BID  . Chlorhexidine Gluconate Cloth  6 each Topical Daily  . clarithromycin  500 mg Per Tube Q12H  . feeding supplement (PRO-STAT SUGAR FREE 64)  60 mL Per Tube TID  . feeding supplement (VITAL HIGH PROTEIN)  1,000 mL Per Tube Q24H  . insulin aspart  0-20 Units Subcutaneous Q4H  . mouth rinse  15 mL Mouth Rinse QID  . metoprolol tartrate  25 mg Per Tube BID  . pantoprazole sodium  40 mg Per Tube BID  . rivaroxaban  20 mg Per Tube Daily  . sodium chloride flush  10-40 mL Intracatheter Q12H   . sodium chloride 250 mL (12/13/16 0700)  . sodium chloride 500 mL (12/03/16 0126)  . potassium chloride 10 mEq (12/13/16 1013)   Anti-infectives    Start     Dose/Rate Route Frequency Ordered Stop   12/13/16 1000  amoxicillin (AMOXIL) 250 MG/5ML suspension 1,000 mg     1,000 mg Per Tube Every 12 hours 12/13/16 0935 12/22/16 1759   12/12/16 1900  piperacillin-tazobactam (ZOSYN) IVPB 3.375 g  Status:  Discontinued     3.375 g 12.5 mL/hr over 240 Minutes Intravenous Every 8 hours 12/12/16 1759 12/13/16 0911   12/12/16 1400  Ampicillin-Sulbactam (UNASYN) 3 g in sodium chloride 0.9 % 100 mL IVPB  Status:  Discontinued     3 g 200 mL/hr over 30 Minutes Intravenous Every 6 hours 12/12/16 1215 12/12/16 1753   12/10/16 0600  amoxicillin (AMOXIL) 250 MG/5ML suspension 1,000 mg  Status:  Discontinued     1,000 mg Per Tube Every 12 hours 12/09/16 1701 12/12/16 1206   12/09/16 1130  amoxicillin (AMOXIL) 250 MG/5ML suspension 1,000 mg  Status:  Discontinued     1,000 mg Oral Every 12 hours 12/09/16 1117 12/09/16 1118   12/09/16 1130  amoxicillin (AMOXIL) 250 MG/5ML suspension 1,000 mg  Status:  Discontinued     1,000 mg Per Tube Every 12 hours 12/09/16 1118 12/09/16 1701   12/04/16 2200  clarithromycin (BIAXIN) tablet 500 mg     500 mg Per Tube Every 12 hours 12/04/16 1107 12/16/16 2359   12/04/16 2000   piperacillin-tazobactam (ZOSYN) IVPB 3.375 g     3.375 g 12.5 mL/hr over 240 Minutes Intravenous Every 8 hours 12/04/16 1339 12/08/16 2359   12/04/16 1600  metroNIDAZOLE (FLAGYL) tablet 500 mg  Status:  Discontinued     500 mg Per Tube Every 8 hours 12/04/16 1107 12/09/16 1117   12/02/16 1100  clarithromycin (BIAXIN) 250 MG/5ML suspension 500 mg  Status:  Discontinued     500 mg Per Tube Every 12 hours 12/02/16 1012 12/04/16 1107   12/02/16 1045  metroNIDAZOLE (FLAGYL) 50 mg/ml oral suspension 500 mg  Status:  Discontinued     500 mg Per Tube 3 times daily 12/02/16 1036 12/04/16 1107   12/02/16 1015  metroNIDAZOLE (FLAGYL) 50 mg/ml oral suspension 250 mg  Status:  Discontinued     250 mg Per Tube 4 times daily 12/02/16 1011 12/02/16 1036   11/28/16 0800  fluconazole (DIFLUCAN) IVPB 200 mg  Status:  Discontinued     200 mg 100 mL/hr over 60 Minutes Intravenous Every 24 hours 11/27/16 0624 12/02/16 1018   11/27/16 0630  fluconazole (DIFLUCAN) IVPB 400 mg     400 mg 100 mL/hr over 120 Minutes Intravenous  Once 11/27/16 0622 11/27/16 0841   11/27/16 0600  piperacillin-tazobactam (ZOSYN) IVPB 3.375 g  Status:  Discontinued     3.375 g 12.5 mL/hr over 240 Minutes Intravenous Every 8 hours 11/27/16 0534 12/04/16 1339   11/27/16 0515  piperacillin-tazobactam (ZOSYN) IVPB 3.375 g  Status:  Discontinued     3.375 g 100 mL/hr over 30 Minutes Intravenous  Once 11/27/16 0510 11/27/16 0518   11/27/16 0030  piperacillin-tazobactam (ZOSYN) IVPB 3.375 g     3.375 g 100 mL/hr over 30 Minutes Intravenous  Once 11/27/16 0016 11/27/16 0755   11/27/16 0030  vancomycin (VANCOCIN) IVPB 1000 mg/200 mL premix     1,000 mg 200 mL/hr over 60 Minutes Intravenous  Once 11/27/16 0016 11/27/16 0756      Assessment/Plan Perforated pyloric ulcer  POD#16 S/P EXPLORATORY LAPAROTOMY, GRAHAM PATCH REPAIR8/10/18 Dr. Alphonsa Overall H Pylori Positive/C diff negative Shock - resolved Acute encephalopathy- prolong  ventilator support Biventricular heart failure PAF ? CVA Leg ischemia Type II diabetes Acute renal failure - resolving FEN:  IV fluids/npo/tube feedings ID:  8/10 - 12/19/16  Amoxil 8/23-12/22/16 clarithromycin 8/17=>> day 11 DVT:  SCD/heparin =>> Xarelto per TF    Plan:  He is going down for swallow evaluation today, and they are planning to send to [facility]. I would consider CT scan.  And not sending him to a lower level of  care till we have a handle on his fevers.   Will discuss with my Doctors.      LOS: 16 days    Dainel Arcidiacono 12/13/2016 [phone]

## 2016-12-13 NOTE — Progress Notes (Signed)
PROGRESS NOTE    Jacob Pittman   GYB:[id number]  DOB: 02-22-1946  DOA: 11/26/2016 PCP: Biagio Borg, MD   Brief Narrative:  Jacob Pittman 71 y/o male with EF 15% (ischemic CMP) s/p AICD, HTN, DM, PAD presented on 8/9 with abdominal pain, LA 9.4 and a bowel perforation. Was found to have a perforated pyloric channel ulcer and pneumoperitonitis s/p omental patch repair subsequently on epinephrine and Levophed.  8/12-  TTE LV moderately dilated with EF 15% &diffuse hypokinesis. There is akinesis of the inferolateral and inferior myocardium.  E coli UTI noted 8/13-  right brachial A- line placed and then developed ischemic right arm- vascular consult and transfer from [facility] to [facility]- thrombectomy of brachial artery- heparin Paroxysmal A-fib also noted 8/14- Decreased attenuation involving the right dentate nucleus in the right cerebellum and immediately adjacent cerebellar parenchyma, concerning for recent and potentially acute infarct in this area.  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 No complaints. Confused to time.  ROS: no complaints of nausea, vomiting, constipation diarrhea, cough, dyspnea or dysuria. No other complaints.   Assessment & Plan:   Principal Problem:   Perforated duodenal bulb ulcer/ peritonitis/   H pylori ulcer  Septic shock  Vancomycin 8/10 (x1 dose) Diflucan 8/10 - 8/15 Zosyn 8/10 > 8/21  Flagyl 8/15 > 8/22  Biaxin 8/15 >>  Amoxicillin 8/22 >> on 8/25 >>Unasyn>>Zosyn - BID PPI- gen surgery following - 8/25 still spiking fevers- cultures negative-  changed amoxil to Unasyn and then Zosyn - central line removed and  fevers resolved- change Zosyn back to Amoxil today-  Active Problems:  Paroxysmal A-fib   - HR climbs to 130s episodically - started Metoprolol through tube - Change Heparin to Xarelto today    Hypernatremia   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   Nonischemic cardiomyopathy  AICD - Ef 15 %, RV failure, mod pulm HTN- follow with Dr Lovena Le - he was fluid overloaded and was then diuresed so will hold off on giving him free water to bring his sodium level down- follow - I and O inaccurate as tube feeds are not being recorded - on Metoprolol- BP borderline- hold off on ACE I for now     Acute respiratory failure with hypoxia  - in relation to sepsis- extubated and stable     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 - on baby aspirin and Heparin>> Xarelto today    Arterial thrombosis - right brachial occurring after A-line - s/p thrombectomy- staples intact  Left arm edema - have asked nurses to elevate it - no DVT on ultrasound on 8/23    Fatty infiltration of liver -  Currently weight and diabetes appear controlled    DM (diabetes mellitus), type 2  - A1c 5.8 on 8/10 - on Metformin at home- sugars well controlled here   DVT prophylaxis: heparin Code Status: Full code Family Communication:  Disposition Plan: possible SNF Consultants:   Neurology  PCCM  Vascular surgery  Gen surgery  Procedures  8/10- ex lap- patch repair of pyloric ucler  8/13- brachial embolectomy 2 D ECHO Left ventricle:  The cavity size was moderately dilated. Wall   thickness was normal. Systolic function was severely reduced. The   estimated ejection fraction was 15%. Diffuse hypokinesis. There   is akinesis of the inferolateral and inferior myocardium. The   study is not technically sufficient to allow evaluation of LV   diastolic function. - Aortic valve: There was mild regurgitation. - Mitral valve: There was moderate  regurgitation. - Left atrium: The atrium was severely dilated. - Right ventricle: The cavity size was moderately dilated. Systolic   function was moderately reduced. - Right atrium: The atrium was severely dilated. - Tricuspid valve: There was moderate regurgitation. - Pulmonary arteries: Systolic pressure was moderately increased.  Antimicrobials:  Anti-infectives    Start     Dose/Rate Route Frequency Ordered Stop   12/12/16 1900  piperacillin-tazobactam (ZOSYN) IVPB 3.375 g  Status:  Discontinued     3.375 g 12.5 mL/hr over 240 Minutes Intravenous Every 8 hours 12/12/16 1759 12/13/16 0911   12/12/16 1400  Ampicillin-Sulbactam (UNASYN) 3 g in sodium chloride 0.9 % 100 mL IVPB  Status:  Discontinued     3 g 200 mL/hr over 30 Minutes Intravenous Every 6 hours 12/12/16 1215 12/12/16 1753   12/10/16 0600  amoxicillin (AMOXIL) 250 MG/5ML suspension 1,000 mg  Status:  Discontinued     1,000 mg Per Tube Every 12 hours 12/09/16 1701 12/12/16 1206   12/09/16 1130  amoxicillin (AMOXIL) 250 MG/5ML suspension 1,000 mg  Status:  Discontinued     1,000 mg Oral Every 12 hours 12/09/16 1117 12/09/16 1118   12/09/16 1130  amoxicillin (AMOXIL) 250 MG/5ML suspension 1,000 mg  Status:  Discontinued     1,000 mg Per Tube Every 12 hours 12/09/16 1118 12/09/16 1701   12/04/16 2200  clarithromycin (BIAXIN) tablet 500 mg     500 mg Per Tube Every 12 hours 12/04/16 1107 12/16/16 2359   12/04/16 2000  piperacillin-tazobactam (ZOSYN) IVPB 3.375 g     3.375 g 12.5 mL/hr over 240 Minutes Intravenous Every 8 hours 12/04/16 1339 12/08/16 2359   12/04/16 1600  metroNIDAZOLE (FLAGYL) tablet 500 mg  Status:  Discontinued     500 mg Per Tube Every 8 hours 12/04/16 1107 12/09/16 1117   12/02/16 1100  clarithromycin (BIAXIN) 250 MG/5ML suspension 500 mg  Status:  Discontinued     500 mg Per Tube Every 12 hours 12/02/16 1012 12/04/16 1107   12/02/16 1045  metroNIDAZOLE (FLAGYL) 50 mg/ml oral suspension 500 mg  Status:   Discontinued     500 mg Per Tube 3 times daily 12/02/16 1036 12/04/16 1107   12/02/16 1015  metroNIDAZOLE (FLAGYL) 50 mg/ml oral suspension 250 mg  Status:  Discontinued     250 mg Per Tube 4 times daily 12/02/16 1011 12/02/16 1036   11/28/16 0800  fluconazole (DIFLUCAN) IVPB 200 mg  Status:  Discontinued     200 mg 100 mL/hr over 60 Minutes Intravenous Every 24 hours 11/27/16 0624 12/02/16 1018   11/27/16 0630  fluconazole (DIFLUCAN) IVPB 400 mg     400 mg 100 mL/hr over 120 Minutes Intravenous  Once 11/27/16 0622 11/27/16 0841   11/27/16 0600  piperacillin-tazobactam (ZOSYN) IVPB 3.375 g  Status:  Discontinued     3.375 g 12.5 mL/hr over 240 Minutes Intravenous Every 8 hours 11/27/16 0534 12/04/16 1339   11/27/16 0515  piperacillin-tazobactam (ZOSYN) IVPB 3.375 g  Status:  Discontinued     3.375 g 100 mL/hr over 30 Minutes Intravenous  Once  11/27/16 0510 11/27/16 0518   11/27/16 0030  piperacillin-tazobactam (ZOSYN) IVPB 3.375 g     3.375 g 100 mL/hr over 30 Minutes Intravenous  Once 11/27/16 0016 11/27/16 0755   11/27/16 0030  vancomycin (VANCOCIN) IVPB 1000 mg/200 mL premix     1,000 mg 200 mL/hr over 60 Minutes Intravenous  Once 11/27/16 0016 11/27/16 0756       Objective: Vitals:   12/13/16 0200 12/13/16 0426 12/13/16 0736 12/13/16 0754  BP: (!) '94/57 98/60 98/79 '$   Pulse: 65 71 (!) 125   Resp: (!) 26 (!) 26 (!) 30   Temp:  98.5 F (36.9 C) 98.7 F (37.1 C)   TempSrc:  Core (Comment) Core (Comment)   SpO2: 100% 100% 100% 100%  Weight:  84.7 kg (186 lb 12.8 oz)    Height:        Intake/Output Summary (Last 24 hours) at 12/13/16 0912 Last data filed at 12/13/16 0435  Gross per 24 hour  Intake              794 ml  Output              925 ml  Net             -131 ml   Filed Weights   12/11/16 2135 12/12/16 0152 12/13/16 0426  Weight: 84.9 kg (187 lb 3.2 oz) 83.4 kg (183 lb 14.4 oz) 84.7 kg (186 lb 12.8 oz)    Examination: General exam: Appears comfortable    HEENT: PERRLA, oral mucosa moist, no sclera icterus or thrush Respiratory system: Clear to auscultation. Respiratory effort normal. Cardiovascular system: S1 & S2 heard,  No murmurs  Gastrointestinal system: Abdomen soft, non-tender, nondistended. Normal bowel sound. No organomegaly Central nervous system: Alert - confused - No focal neurological deficits. Extremities: No cyanosis, clubbing- 2 + edema of left arm Skin: No rashes or ulcers- staples on right arm clean Psychiatry:  Mood & affect appropriate.    Data Reviewed: I have personally reviewed following labs and imaging studies  CBC:  Recent Labs Lab 12/08/16 0419 12/09/16 0358 12/10/16 0350 12/11/16 0305 12/12/16 0443  WBC 9.0 8.0 9.8 10.0 8.7  NEUTROABS 7.3 6.2 7.7 7.9* 6.8  HGB 9.8* 9.5* 9.8* 9.9* 10.2*  HCT 29.8* 29.6* 31.2* 32.0* 33.8*  MCV 100.7* 104.6* 105.8* 107.7* 109.0*  PLT 381 419* 455* 471* 546*   Basic Metabolic Panel:  Recent Labs Lab 12/07/16 1800 12/08/16 0419 12/09/16 0358 12/09/16 1710 12/09/16 1924 12/10/16 0350 12/11/16 0305 12/12/16 0443  NA 144 146* 149* 150* 149* 148* 153* 153*  K 3.4* 3.2* 3.4* 3.9 3.8 3.6 3.6 3.3*  CL 115* 114* 115* 117* 115* 114* 113* 117*  CO2 '22 23 25 26 28 28 31 28  '$ GLUCOSE 143* 111* 99 157* 128* 116* 136* 125*  BUN 34* 38* 41* 39* 41* 43* 47* 44*  CREATININE 1.00 1.06 1.15 0.98 1.09 1.04 1.06 0.95  CALCIUM 7.9* 8.0* 8.1* 8.2* 8.3* 8.2* 8.5* 8.5*  MG  --  1.7 1.8  --  2.1 2.1 2.0 2.0  PHOS 2.2* 3.1 3.5  --   --  3.0  --  2.9   GFR: Estimated Creatinine Clearance: 73.4 mL/min (by C-G formula based on SCr of 0.95 mg/dL). Liver Function Tests:  Recent Labs Lab 12/07/16 1800 12/08/16 0419 12/09/16 0358 12/10/16 0350  ALBUMIN 1.7* 1.8* 1.8* 1.9*   No results for input(s): LIPASE, AMYLASE in the last 168 hours. No results for input(s): AMMONIA  in the last 168 hours. Coagulation Profile: No results for input(s): INR, PROTIME in the last 168  hours. Cardiac Enzymes: No results for input(s): CKTOTAL, CKMB, CKMBINDEX, TROPONINI in the last 168 hours. BNP (last 3 results) No results for input(s): PROBNP in the last 8760 hours. HbA1C: No results for input(s): HGBA1C in the last 72 hours. CBG:  Recent Labs Lab 12/12/16 1656 12/12/16 1951 12/12/16 2353 12/13/16 0426 12/13/16 0735  GLUCAP 259* 167* 146* 125* 105*   Lipid Profile: No results for input(s): CHOL, HDL, LDLCALC, TRIG, CHOLHDL, LDLDIRECT in the last 72 hours. Thyroid Function Tests: No results for input(s): TSH, T4TOTAL, FREET4, T3FREE, THYROIDAB in the last 72 hours. Anemia Panel: No results for input(s): VITAMINB12, FOLATE, FERRITIN, TIBC, IRON, RETICCTPCT in the last 72 hours. Urine analysis:    Component Value Date/Time   COLORURINE YELLOW 09/10/2016 [place] 09/10/2016 1002   LABSPEC 1.025 09/10/2016 1002   PHURINE 6.5 09/10/2016 1002   GLUCOSEU NEGATIVE 09/10/2016 1002   HGBUR NEGATIVE 09/10/2016 1002   BILIRUBINUR SMALL (A) 09/10/2016 1002   KETONESUR NEGATIVE 09/10/2016 1002   PROTEINUR NEGATIVE 06/10/2010 0954   UROBILINOGEN >=8.0 (A) 09/10/2016 1002   NITRITE NEGATIVE 09/10/2016 1002   LEUKOCYTESUR NEGATIVE 09/10/2016 1002   Sepsis Labs: '@LABRCNTIP'$ (procalcitonin:4,lacticidven:4) ) Recent Results (from the past 240 hour(s))  C difficile quick scan w PCR reflex     Status: None   Collection Time: 12/04/16 11:07 AM  Result Value Ref Range Status   C Diff antigen NEGATIVE NEGATIVE Final   C Diff toxin NEGATIVE NEGATIVE Final   C Diff interpretation No C. difficile detected.  Final  Culture, Urine     Status: None   Collection Time: 12/04/16  7:32 PM  Result Value Ref Range Status   Specimen Description Urine  Final   Special Requests NONE  Final   Culture NO GROWTH  Final   Report Status 12/06/2016 FINAL  Final  Culture, blood (routine x 2)     Status: None   Collection Time: 12/04/16  8:48 PM  Result Value Ref Range  Status   Specimen Description BLOOD LEFT HAND  Final   Special Requests   Final    BOTTLES DRAWN AEROBIC ONLY Blood Culture adequate volume   Culture NO GROWTH 5 DAYS  Final   Report Status 12/10/2016 FINAL  Final  Culture, blood (routine x 2)     Status: None   Collection Time: 12/04/16  9:01 PM  Result Value Ref Range Status   Specimen Description BLOOD RIGHT HAND  Final   Special Requests   Final    BOTTLES DRAWN AEROBIC ONLY Blood Culture adequate volume   Culture NO GROWTH 5 DAYS  Final   Report Status 12/10/2016 FINAL  Final  Culture, respiratory (NON-Expectorated)     Status: None   Collection Time: 12/05/16 12:30 AM  Result Value Ref Range Status   Specimen Description TRACHEAL ASPIRATE  Final   Special Requests NONE  Final   Gram Stain   Final    RARE WBC PRESENT,BOTH PMN AND MONONUCLEAR NO ORGANISMS SEEN    Culture NO GROWTH 2 DAYS  Final   Report Status 12/07/2016 FINAL  Final  Culture, Urine     Status: None   Collection Time: 12/10/16 11:58 AM  Result Value Ref Range Status   Specimen Description URINE, RANDOM  Final   Special Requests NONE  Final   Culture NO GROWTH  Final   Report Status 12/11/2016  FINAL  Final  Culture, blood (routine x 2)     Status: None (Preliminary result)   Collection Time: 12/10/16 12:39 PM  Result Value Ref Range Status   Specimen Description BLOOD RIGHT HAND  Final   Special Requests IN PEDIATRIC BOTTLE Blood Culture adequate volume  Final   Culture NO GROWTH 2 DAYS  Final   Report Status PENDING  Incomplete  Culture, blood (routine x 2)     Status: None (Preliminary result)   Collection Time: 12/10/16 12:39 PM  Result Value Ref Range Status   Specimen Description BLOOD RIGHT HAND  Final   Special Requests IN PEDIATRIC BOTTLE Blood Culture adequate volume  Final   Culture NO GROWTH 2 DAYS  Final   Report Status PENDING  Incomplete         Radiology Studies: No results found.    Scheduled Meds: . aspirin  81 mg Per Tube  Daily  . chlorhexidine gluconate (MEDLINE KIT)  15 mL Mouth Rinse BID  . Chlorhexidine Gluconate Cloth  6 each Topical Daily  . clarithromycin  500 mg Per Tube Q12H  . feeding supplement (PRO-STAT SUGAR FREE 64)  60 mL Per Tube TID  . feeding supplement (VITAL HIGH PROTEIN)  1,000 mL Per Tube Q24H  . insulin aspart  0-20 Units Subcutaneous Q4H  . ipratropium-albuterol  3 mL Nebulization Q6H  . mouth rinse  15 mL Mouth Rinse QID  . metoprolol tartrate  25 mg Per Tube BID  . pantoprazole sodium  40 mg Per Tube BID  . sodium chloride flush  10-40 mL Intracatheter Q12H   Continuous Infusions: . sodium chloride 250 mL (12/12/16 1800)  . sodium chloride 500 mL (12/03/16 0126)  . heparin 1,550 Units/hr (12/12/16 1700)     LOS: 16 days    Time spent in minutes: 35    Debbe Odea, MD [facility] Pager: [email or web address] Password [email or web address] 12/13/2016, 9:12 AM

## 2016-12-14 ENCOUNTER — Inpatient Hospital Stay (HOSPITAL_COMMUNITY): Payer: Medicare PPO

## 2016-12-14 LAB — CBC WITH DIFFERENTIAL/PLATELET
BASOS ABS: 0 10*3/uL (ref 0.0–0.1)
BASOS PCT: 0 %
EOS ABS: 0.1 10*3/uL (ref 0.0–0.7)
EOS PCT: 1 %
HCT: 31.3 % — ABNORMAL LOW (ref 39.0–52.0)
HEMOGLOBIN: 9.6 g/dL — AB (ref 13.0–17.0)
LYMPHS ABS: 0.9 10*3/uL (ref 0.7–4.0)
Lymphocytes Relative: 14 %
MCH: 33.7 pg (ref 26.0–34.0)
MCHC: 30.7 g/dL (ref 30.0–36.0)
MCV: 109.8 fL — ABNORMAL HIGH (ref 78.0–100.0)
Monocytes Absolute: 0.3 10*3/uL (ref 0.1–1.0)
Monocytes Relative: 4 %
NEUTROS PCT: 81 %
Neutro Abs: 5.4 10*3/uL (ref 1.7–7.7)
PLATELETS: 381 10*3/uL (ref 150–400)
RBC: 2.85 MIL/uL — AB (ref 4.22–5.81)
RDW: 16 % — ABNORMAL HIGH (ref 11.5–15.5)
WBC: 6.7 10*3/uL (ref 4.0–10.5)

## 2016-12-14 LAB — HEPARIN LEVEL (UNFRACTIONATED): HEPARIN UNFRACTIONATED: 0.15 [IU]/mL — AB (ref 0.30–0.70)

## 2016-12-14 LAB — GLUCOSE, CAPILLARY
GLUCOSE-CAPILLARY: 185 mg/dL — AB (ref 65–99)
GLUCOSE-CAPILLARY: 91 mg/dL (ref 65–99)
GLUCOSE-CAPILLARY: 166 mg/dL — AB (ref 65–99)
Glucose-Capillary: 102 mg/dL — ABNORMAL HIGH (ref 65–99)
Glucose-Capillary: 144 mg/dL — ABNORMAL HIGH (ref 65–99)
Glucose-Capillary: 98 mg/dL (ref 65–99)

## 2016-12-14 LAB — MAGNESIUM: MAGNESIUM: 2.3 mg/dL (ref 1.7–2.4)

## 2016-12-14 MED ORDER — PRO-STAT SUGAR FREE PO LIQD: 30.0000 mL | Freq: Every day | ORAL | Status: DC

## 2016-12-14 MED ORDER — VITAL 1.5 CAL PO LIQD
1000.0000 mL | ORAL | Status: DC
  Filled 2016-12-14 (×2): qty 1000

## 2016-12-14 MED ORDER — PRO-STAT SUGAR FREE PO LIQD
30.0000 mL | Freq: Two times a day (BID) | ORAL | Status: DC
  Administered 2016-12-14 – 2016-12-23 (×17): 30 mL
  Filled 2016-12-14 (×16): qty 30

## 2016-12-14 MED ORDER — FREE WATER
200.0000 mL | Freq: Three times a day (TID) | Status: DC
  Administered 2016-12-14 – 2016-12-17 (×8): 200 mL

## 2016-12-14 MED ORDER — VITAL 1.5 CAL PO LIQD
1000.0000 mL | ORAL | Status: DC
  Administered 2016-12-14 – 2016-12-20 (×8): 1000 mL
  Filled 2016-12-14 (×17): qty 1000

## 2016-12-14 NOTE — Progress Notes (Addendum)
Pt had a 8 beat run of SVT. Pt asymptomatic. Dr. Antionette Char notified. Orders given. Will continue to monitor.

## 2016-12-14 NOTE — Progress Notes (Addendum)
Nutrition Follow-up  DOCUMENTATION CODES:   Obesity unspecified  INTERVENTION:   Tube Feeding:   Recommend changing to Vital 1.5 @ rate of 55 ml/hr with Pro-stat 30 mL BID providing 112 g of protein, 2180 kcals and 1003 mL of free water  NUTRITION DIAGNOSIS:   Inadequate oral intake related to inability to eat as evidenced by NPO status.  Being addressed via TF  GOAL:   Patient will meet greater than or equal to 90% of their needs  Progressing  MONITOR:   Diet advancement, TF tolerance, Labs, Weight trends, Skin  REASON FOR ASSESSMENT:   Consult Enteral/tube feeding initiation and management  ASSESSMENT:   71 y.o.M with CHF (EF 15%), HTN, DM, PAD, and OA. Pt admitted with peritonitis and septic shock secondary to perforated ulcer s/p exp lap oversew pyloric channel ulcer, graham omental patch repair for perforated ulcer. Pt transferred to [facility] for brachial embolectomy 8/13.  8/23 Extubated Nutritional needs re-estimated post extubation  SLP following, pt remains NPO at present  Tolerating Vital High Protein @ 30 ml/hr with Pro-Stat 60 mL TID via NG tube Free water flushes of 200 mL q 8 hours ordered today  Noted weight down significantly from admission; noted pt net negative 8.7 L  Labs: sodium 154 (8/26), Creatinine wdl Meds: reviewed  Diet Order:  Diet NPO time specified  Skin:  Wound (see comment) (Non-pressure abdominal wound, abdominal incision from 8/101, R arm incision from 8/13)  Last BM:  8/26  Height:   Ht Readings from Last 1 Encounters:  12/13/16 6\' 1"  (1.854 m)    Weight:   Wt Readings from Last 1 Encounters:  12/14/16 180 lb 6.4 oz (81.8 kg)    Ideal Body Weight:  70 kg  BMI:  Body mass index is 23.8 kg/m.  Estimated Nutritional Needs:   Kcal:  2020-2265 kcals  Protein:  100-115 g   Fluid:  >/= 2 L  EDUCATION NEEDS:   No education needs identified at this time  Romelle Starcher MS, RD, LDN [phone] Pager   [phone] Weekend/On-Call Pager

## 2016-12-14 NOTE — Progress Notes (Signed)
PT Cancellation Note  Patient Details Name: Jacob Pittman MRN: [id number] DOB: 1946-02-10   Cancelled Treatment:    Reason Eval/Treat Not Completed: Medical issues which prohibited therapy Upon arrival, pt's NG tube was displaced. RN aware and in to assess patient. PT will continue to follow acutely.    Derek Mound, PTA Pager: [phone]   12/14/2016, 3:46 PM

## 2016-12-14 NOTE — Progress Notes (Signed)
[facility] Progress Note 14 Days Post-Op  Subjective: Patient doing okay.  Somewhat responsive.  Family member at the bedside.  Objective: Vital signs in last 24 hours: Temp:  [98 F (36.7 C)-98.6 F (37 C)] 98.5 F (36.9 C) (08/27 0639) Pulse Rate:  [73-107] 107 (08/27 0639) Resp:  [20-33] 22 (08/27 0639) BP: (98-121)/(51-70) 121/70 (08/27 0639) SpO2:  [97 %-100 %] 97 % (08/27 0639) Weight:  [81.8 kg (180 lb 6.4 oz)-82.3 kg (181 lb 6.4 oz)] 81.8 kg (180 lb 6.4 oz) (08/27 0639) Last BM Date: 12/13/16  Intake/Output from previous day: 08/26 0701 - 08/27 0700 In: 800 [I.V.:200; NG/GT:600] Out: 1785 [Urine:1685; Emesis/NG output:100] Intake/Output this shift: No intake/output data recorded.  General: No distress and doing apparently better per family member  Lungs: Clear to auscultation   Abd: Distended with bowel sounds.  Having bowel movement.  CT yesterday did not demonstrate any dominant fluid collection that requires drainage.  Extremities: No changes  Neuro: Seems tires.  Responds.  Family member states that his memory is poor.  Lab Results:  @LABLAST2 (wbc:2,hgb:2,hct:2,plt:2) BMET ) Recent Labs  12/12/16 0443 12/13/16 1338  NA 153* 154*  K 3.3* 4.0  CL 117* 121*  CO2 28 25  GLUCOSE 125* 177*  BUN 44* 51*  CREATININE 0.95 0.99  CALCIUM 8.5* 8.7*   PT/INR No results for input(s): LABPROT, INR in the last 72 hours. ABG No results for input(s): PHART, HCO3 in the last 72 hours.  Invalid input(s): PCO2, PO2  Studies/Results: Ct Abdomen Pelvis W Contrast  Result Date: 12/13/2016 CLINICAL DATA:  71 year old male with sepsis, perforated duodenum bulb ulcer. Status post exploratory laparotomy, omental patch on 11/27/2016. EXAM: CT ABDOMEN AND PELVIS WITH CONTRAST TECHNIQUE: Multidetector CT imaging of the abdomen and pelvis was performed using the standard protocol following bolus administration of intravenous contrast. CONTRAST:  ISOVUE-300 IOPAMIDOL  (ISOVUE-300) INJECTION 61% COMPARISON:  CTA chest abdomen and pelvis 11/27/2016 FINDINGS: Lower chest: Stable cardiomegaly. No pericardial effusion. Increased small layering right pleural effusion and right lower lobe atelectasis. Left lower lobe and right middle lobe ventilation is stable to mildly improved since 11/27/2016. Enteric tube courses to the stomach. Hepatobiliary: Regressed perihepatic fluid, now trace. There is a small 14 mm subcapsular low-density area along the posterior right hepatic lobe which appears to be new on series 3, image 25. The remaining liver parenchyma is within normal limits. Negative gallbladder. No biliary ductal enlargement. Pancreas: Negative. Spleen: Trace perisplenic fluid, otherwise negative. Adrenals/Urinary Tract: Normal adrenal glands. Occasional benign renal cysts. Bilateral renal contrast enhancement and excretion is within normal limits. Foley catheter removed from the urinary bladder. Mild bladder distension. Stomach/Bowel: Fluid intermittently within large bowel to the rectum. Oral contrast has reached the mid transverse colon. Redundant and moderately dilated sigmoid colon mostly containing gas. Diverticulosis at the junction of the sigmoid and descending colon. Upper limits of normal gas and fluid or contrast filled left and transverse colon. Right colon appears normal aside from moderate diverticulosis. Diminutive or absent appendix. Negative terminal ileum. No dilated small bowel. Generalized mild to moderate mesenteric stranding along the greater omentum. Enteric tube courses to the distal gastric body. Continued small volume pneumoperitoneum about the gastric antrum (Series 3 images 21 and 22). The duodenum is decompressed. No retroperitoneal gas or fluid. There is a partially organized appearing fluid collection situated along the undersurface of the redundant sigmoid colon and adjacent to some otherwise normal appearing distal small bowel loops which encompasses  79 x 59 x 83  mm (AP by transverse by CC). See series 3, image 60 in coronal image 35. Estimated volume of this dominant portion is 193 mL. This is contiguous with a small volume of fluid tracking into the cul-de-sac of the pelvis. The superficial aspect of this fluid abuts the ventral abdominal wall and wound. No gas identified within the collection. Occasional much smaller loculated collections, such as that along the ventral aspect of the transverse colon on series 3, image 29 and coronal image 20. These do not appear drainable. Vascular/Lymphatic: Extensive Aortoiliac calcified atherosclerosis. Major arterial structures in the abdomen and pelvis remain patent. Portal venous system is patent. Reproductive: Negative. Other: Ventral midline abdominal wound. Small volume of pelvic free fluid. Fat containing inguinal hernias are stable. Musculoskeletal: Stable visualized osseous structures. Thoracic spine ankylosis IMPRESSION: 1. Organizing fluid collection along the caudal aspect of redundant and distended proximal sigmoid colon in the mid abdomen is nonspecific but might represent a developing abscess. The dominant portion of this collection is estimated at 193 mL. This fluid is contiguous with a smaller volume of fluid tracking to the pelvic cul-de-sac, and also abuts normal appearing small bowel loops. Occasional other tiny loculated appearing mesenteric fluid collections which are not drainable. 2. Small volume of persistent pneumoperitoneum near the gastric antrum. Generalized mild to moderate soft tissue stranding of the greater omentum. 3. Small volume perihepatic and perisplenic fluid. There is a new small subcapsular 15 mm collection at the posterior right liver. This is nonspecific, but a small liver abscess is difficult to exclude. 4. Increased small layering right pleural effusion. 5. NG tube terminates at the gastric body. Oral contrast has reached the transverse colon but there is probably a degree of  large bowel ileus, maximal at the proximal sigmoid in #1. Electronically Signed   By: Odessa Fleming M.D.   On: 12/13/2016 16:21   Dg Chest Port 1 View  Result Date: 12/13/2016 CLINICAL DATA:  Fever. EXAM: PORTABLE CHEST 1 VIEW COMPARISON:  12/10/2016 FINDINGS: Endotracheal tube was removed. Nasogastric tube extends into the abdomen. Again noted is a left cardiac ICD. Heart remains enlarged for size. Right jugular central line has been removed. Increased densities in the right lower chest. Upper lungs remain clear. Negative for a pneumothorax. IMPRESSION: Increased densities in the right lower chest. Findings may represent atelectasis. However, infection cannot be excluded based on history of fever. Recommend follow-up. Support apparatuses as described. Electronically Signed   By: Richarda Overlie M.D.   On: 12/13/2016 12:01   Dg Swallowing Func-speech Pathology  Result Date: 12/13/2016 Objective Swallowing Evaluation: Type of Study: MBS-Modified Barium Swallow Study Patient Details Name: Bralynn Donado MRN: [id number] Date of Birth: [birth year] Today's Date: 12/13/2016 Time: SLP Start Time (ACUTE ONLY): 1530-SLP Stop Time (ACUTE ONLY): 1540 SLP Time Calculation (min) (ACUTE ONLY): 10 min Past Medical History: Past Medical History: Diagnosis Date . ANXIETY 02/04/2009 . CHF (congestive heart failure) (HCC)  . Chronic systolic CHF (congestive heart failure) (HCC)  . DIABETES MELLITUS, TYPE II 11/07/2008 . 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 HIP PAIN, RIGHT 11/06/2009 . HYPERLIPIDEMIA 11/07/2008 . HYPERTENSION 10/08/2008 . Nonischemic cardiomyopathy [facility])   s/p ICD implantation 03-2013 by Dr Ladona Ridgel . OSTEOARTHRITIS, KNEE, RIGHT 10/08/2008 . Peripheral arterial disease (HCC)  Past Surgical History: Past Surgical History: Procedure Laterality Date . CARDIAC DEFIBRILLATOR PLACEMENT Left 04/10/13  BTK single chamber ICD implanted by Dr Ladona Ridgel for primary prevention . EMBOLECTOMY Right 11/30/2016  Procedure: BRACHIAL EMBOLECTOMY  WITH VEIN PATCH ANGIOPLASTY;  Surgeon: Sherren Kerns,  MD;  Location: [facility] OR;  Service: Vascular;  Laterality: Right; . IMPLANTABLE CARDIOVERTER DEFIBRILLATOR IMPLANT N/A 04/10/2013  Procedure: IMPLANTABLE CARDIOVERTER DEFIBRILLATOR IMPLANT;  Surgeon: Marinus Maw, MD;  Location: [facility] CATH LAB;  Service: Cardiovascular;  Laterality: N/A; . LAPAROTOMY N/A 11/27/2016  Procedure: EXPLORATORY LAPAROTOMY abdominal exploration oversew pyloric channel ulcer gram patch repair perforated ulcer;  Surgeon: Ovidio Kin, MD;  Location: [facility] ORS;  Service: General;  Laterality: N/A; . s/p left broken arm with pin    1980's HPI: Pt is a 71 year old man with ischemic cardiomyopathy, DM, HTN, PAD, OSA who was admitted 8/9 with gastric ulcer perforation and peritonitis. He underwent emergent laparotomy with omental patch repair, postoperatively remained in shock requiring epinephrine and Levophed drip. Course c/b acute ischemia of R arm requiring tx from [facility] to [facility] and urgent OR embolectomy, also c/b slow vent wean. He was intubated 8/10-8/23. SLP ordered for swallowing evaluation post-extubation. Subjective: alert, cooperative Assessment / Plan / Recommendation CHL IP CLINICAL IMPRESSIONS 12/13/2016 Clinical Impression Patient presents with severe risk for aspiration in the context of physical deconditioning after prolonged intubation. MBS limited due to pt's severe aspiration risk. Secretions appear to be thick, coating pt's posterior pharynx and laryngeal vestibule, visible via movement as pt breathing, coughing. SLP performed oral care and suctioning. With pureed solid, pt with prolonged oral phase, weak manipulation and delayed oral transit, initiating a weak pharyngeal swallow with moderately reduced tongue base retraction, minimal hyolaryngeal excursion, absent epiglottic deflection and reduced pharyngeal constriction. This led to retention of 100% of the bolus in vallecue. SLP cued pt for dry swallows, chin tuck, in an effort  to facilitate bolus clearance, which were ineffective. As contrast pooled with pt's secretions, material spilled over pt's epiglottis and into the laryngeal vestibule. Pt intermittently sensed penetration/aspiration, though reflexive and cued cough not effective for airway clearance. Recommend pt remain NPO, with ice chips after oral care to facilitate use of swallowing musculature. Patient may benefit from RMT to strengthen cough, swallowing musculature. Will follow up for therapeutic exercise.  SLP Visit Diagnosis Dysphagia, oropharyngeal phase (R13.12) Attention and concentration deficit following -- Frontal lobe and executive function deficit following -- Impact on safety and function Severe aspiration risk   CHL IP TREATMENT RECOMMENDATION 12/13/2016 Treatment Recommendations F/U MBS in --- days (Comment)   Prognosis 12/13/2016 Prognosis for Safe Diet Advancement Good Barriers to Reach Goals Severity of deficits Barriers/Prognosis Comment -- CHL IP DIET RECOMMENDATION 12/13/2016 SLP Diet Recommendations NPO;Ice chips PRN after oral care;Alternative means - temporary Liquid Administration via -- Medication Administration Via alternative means Compensations -- Postural Changes --   CHL IP OTHER RECOMMENDATIONS 12/13/2016 Recommended Consults -- Oral Care Recommendations Oral care QID;Other (Comment) Other Recommendations Have oral suction available;Remove water pitcher   CHL IP FOLLOW UP RECOMMENDATIONS 12/13/2016 Follow up Recommendations Other (comment)   CHL IP FREQUENCY AND DURATION 12/13/2016 Speech Therapy Frequency (ACUTE ONLY) min 2x/week Treatment Duration 2 weeks      CHL IP ORAL PHASE 12/13/2016 Oral Phase Impaired Oral - Pudding Teaspoon -- Oral - Pudding Cup -- Oral - Honey Teaspoon -- Oral - Honey Cup -- Oral - Nectar Teaspoon -- Oral - Nectar Cup -- Oral - Nectar Straw -- Oral - Thin Teaspoon Lingual pumping;Delayed oral transit;Reduced posterior propulsion;Weak lingual manipulation Oral - Thin Cup --  Oral - Thin Straw -- Oral - Puree Lingual pumping;Delayed oral transit;Piecemeal swallowing;Reduced posterior propulsion;Weak lingual manipulation Oral - Mech Soft -- Oral - Regular -- Oral - Multi-Consistency --  Oral - Pill -- Oral Phase - Comment --  CHL IP PHARYNGEAL PHASE 12/13/2016 Pharyngeal Phase Impaired Pharyngeal- Pudding Teaspoon -- Pharyngeal -- Pharyngeal- Pudding Cup -- Pharyngeal -- Pharyngeal- Honey Teaspoon -- Pharyngeal -- Pharyngeal- Honey Cup -- Pharyngeal -- Pharyngeal- Nectar Teaspoon -- Pharyngeal -- Pharyngeal- Nectar Cup -- Pharyngeal -- Pharyngeal- Nectar Straw -- Pharyngeal -- Pharyngeal- Thin Teaspoon Delayed swallow initiation-vallecula;Reduced pharyngeal peristalsis;Reduced epiglottic inversion;Reduced anterior laryngeal mobility;Reduced laryngeal elevation;Reduced airway/laryngeal closure;Reduced tongue base retraction;Penetration/Aspiration before swallow;Penetration/Apiration after swallow;Trace aspiration;Pharyngeal residue - valleculae Pharyngeal Material enters airway, passes BELOW cords and not ejected out despite cough attempt by patient Pharyngeal- Thin Cup -- Pharyngeal -- Pharyngeal- Thin Straw -- Pharyngeal -- Pharyngeal- Puree Delayed swallow initiation-vallecula;Reduced pharyngeal peristalsis;Reduced epiglottic inversion;Reduced anterior laryngeal mobility;Reduced laryngeal elevation;Reduced airway/laryngeal closure;Reduced tongue base retraction;Penetration/Apiration after swallow;Trace aspiration;Pharyngeal residue - valleculae;Pharyngeal residue - pyriform Pharyngeal Material enters airway, passes BELOW cords and not ejected out despite cough attempt by patient;Material enters airway, passes BELOW cords without attempt by patient to eject out (silent aspiration) Pharyngeal- Mechanical Soft -- Pharyngeal -- Pharyngeal- Regular -- Pharyngeal -- Pharyngeal- Multi-consistency -- Pharyngeal -- Pharyngeal- Pill -- Pharyngeal -- Pharyngeal Comment --  CHL IP CERVICAL ESOPHAGEAL  PHASE 12/13/2016 Cervical Esophageal Phase Impaired Pudding Teaspoon -- Pudding Cup -- Honey Teaspoon -- Honey Cup -- Nectar Teaspoon -- Nectar Cup -- Nectar Straw -- Thin Teaspoon -- Thin Cup -- Thin Straw -- Puree Reduced cricopharyngeal relaxation Mechanical Soft -- Regular -- Multi-consistency -- Pill -- Cervical Esophageal Comment -- Rondel Baton, MS, CCC-SLP Speech-Language Pathologist [phone] No flowsheet data found. Arlana Lindau 12/13/2016, 4:32 PM               Anti-infectives: Anti-infectives    Start     Dose/Rate Route Frequency Ordered Stop   12/13/16 1000  amoxicillin (AMOXIL) 250 MG/5ML suspension 1,000 mg     1,000 mg Per Tube Every 12 hours 12/13/16 0935 12/16/16 2359   12/12/16 1900  piperacillin-tazobactam (ZOSYN) IVPB 3.375 g  Status:  Discontinued     3.375 g 12.5 mL/hr over 240 Minutes Intravenous Every 8 hours 12/12/16 1759 12/13/16 0911   12/12/16 1400  Ampicillin-Sulbactam (UNASYN) 3 g in sodium chloride 0.9 % 100 mL IVPB  Status:  Discontinued     3 g 200 mL/hr over 30 Minutes Intravenous Every 6 hours 12/12/16 1215 12/12/16 1753   12/10/16 0600  amoxicillin (AMOXIL) 250 MG/5ML suspension 1,000 mg  Status:  Discontinued     1,000 mg Per Tube Every 12 hours 12/09/16 1701 12/12/16 1206   12/09/16 1130  amoxicillin (AMOXIL) 250 MG/5ML suspension 1,000 mg  Status:  Discontinued     1,000 mg Oral Every 12 hours 12/09/16 1117 12/09/16 1118   12/09/16 1130  amoxicillin (AMOXIL) 250 MG/5ML suspension 1,000 mg  Status:  Discontinued     1,000 mg Per Tube Every 12 hours 12/09/16 1118 12/09/16 1701   12/04/16 2200  clarithromycin (BIAXIN) tablet 500 mg     500 mg Per Tube Every 12 hours 12/04/16 1107 12/16/16 2359   12/04/16 2000  piperacillin-tazobactam (ZOSYN) IVPB 3.375 g     3.375 g 12.5 mL/hr over 240 Minutes Intravenous Every 8 hours 12/04/16 1339 12/08/16 2359   12/04/16 1600  metroNIDAZOLE (FLAGYL) tablet 500 mg  Status:  Discontinued     500 mg Per Tube Every 8  hours 12/04/16 1107 12/09/16 1117   12/02/16 1100  clarithromycin (BIAXIN) 250 MG/5ML suspension 500 mg  Status:  Discontinued  500 mg Per Tube Every 12 hours 12/02/16 1012 12/04/16 1107   12/02/16 1045  metroNIDAZOLE (FLAGYL) 50 mg/ml oral suspension 500 mg  Status:  Discontinued     500 mg Per Tube 3 times daily 12/02/16 1036 12/04/16 1107   12/02/16 1015  metroNIDAZOLE (FLAGYL) 50 mg/ml oral suspension 250 mg  Status:  Discontinued     250 mg Per Tube 4 times daily 12/02/16 1011 12/02/16 1036   11/28/16 0800  fluconazole (DIFLUCAN) IVPB 200 mg  Status:  Discontinued     200 mg 100 mL/hr over 60 Minutes Intravenous Every 24 hours 11/27/16 0624 12/02/16 1018   11/27/16 0630  fluconazole (DIFLUCAN) IVPB 400 mg     400 mg 100 mL/hr over 120 Minutes Intravenous  Once 11/27/16 0622 11/27/16 0841   11/27/16 0600  piperacillin-tazobactam (ZOSYN) IVPB 3.375 g  Status:  Discontinued     3.375 g 12.5 mL/hr over 240 Minutes Intravenous Every 8 hours 11/27/16 0534 12/04/16 1339   11/27/16 0515  piperacillin-tazobactam (ZOSYN) IVPB 3.375 g  Status:  Discontinued     3.375 g 100 mL/hr over 30 Minutes Intravenous  Once 11/27/16 0510 11/27/16 0518   11/27/16 0030  piperacillin-tazobactam (ZOSYN) IVPB 3.375 g     3.375 g 100 mL/hr over 30 Minutes Intravenous  Once 11/27/16 0016 11/27/16 0755   11/27/16 0030  vancomycin (VANCOCIN) IVPB 1000 mg/200 mL premix     1,000 mg 200 mL/hr over 60 Minutes Intravenous  Once 11/27/16 0016 11/27/16 0756      Assessment/Plan: s/p Procedure(s): BRACHIAL EMBOLECTOMY WITH VEIN PATCH ANGIOPLASTY COntinue tube feedings.  LOS: 17 days   Marta Lamas. Gae Bon, MD, FACS [phone] [facility] 12/14/2016

## 2016-12-14 NOTE — Progress Notes (Signed)
TF restarted after NGT placement confirmed per XR; "Enteric tube in good position in the gastric body" Vital 1.5 @ 55 ml/hr as ordered. Arlisa Leclere Ruthe Mannan, RN.

## 2016-12-14 NOTE — Care Management Important Message (Signed)
Important Message  Patient Details  Name: Jacob Pittman MRN: [id number] Date of Birth: [birth year]   Medicare Important Message Given:  Yes    Mickey Esguerra Stefan Church 12/14/2016, 1:31 PM

## 2016-12-14 NOTE — Progress Notes (Signed)
PROGRESS NOTE    Jacob Pittman   JJK:[id number]  DOB: [birth year]  DOA: 11/26/2016 PCP: Biagio Borg, MD   Brief Narrative:  Jacob Pittman 71 y/o male with EF 15% (ischemic CMP) s/p AICD, HTN, DM, PAD presented on 8/9 with abdominal pain, LA 9.4 and a bowel perforation. Was found to have a perforated pyloric channel ulcer and pneumoperitonitis s/p omental patch repair subsequently on epinephrine and Levophed.  8/12-  TTE LV moderately dilated with EF 15% &diffuse hypokinesis. There is akinesis of the inferolateral and inferior myocardium.  E coli UTI noted 8/13-  right brachial A- line placed and then developed ischemic right arm- vascular consult and transfer from [facility] to [facility]- thrombectomy of brachial artery- heparin Paroxysmal A-fib also noted 8/14- Decreased attenuation involving the right dentate nucleus in the right cerebellum and immediately adjacent cerebellar parenchyma, concerning for recent and potentially acute infarct in this area.  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 No complaints. Confused.     Assessment & Plan:   Principal Problem:   Perforated duodenal bulb ulcer/ peritonitis/   H pylori ulcer  Septic shock  Vancomycin 8/10 (x1 dose) Diflucan 8/10 - 8/15 Zosyn 8/10 > 8/21  Flagyl 8/15 > 8/22  Biaxin 8/15 >>  Amoxicillin 8/22 >> on 8/25 >>Unasyn>>Zosyn - BID PPI-  - 8/25  spiking fevers up to 103- cultures negative-  changed amoxil to Unasyn and then Zosyn - central line removed and fevers resolved - 8/26- changed Zosyn back to Amoxil  - 8/27 - no recurrence of fever- CT abd/pelvis and CXR ordered by gen surgery  as they felt he may have another source but I still feel the source was the central line.  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   Nonischemic cardiomyopathy  AICD - Ef 15 %, RV failure, mod pulm HTN- follow with Dr Lovena Le - he was fluid overloaded and was then diuresed so will hold off on giving him free water to bring his sodium level down- follow - I and O inaccurate as tube feeds were initially not being recorded - on Metoprolol- BP borderline- hold off on ACE I for now    Acute respiratory failure with hypoxia  - in relation to sepsis- extubated and stable     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 - on baby aspirin  - Heparin>> Xarelto    Arterial thrombosis - right brachial occurring after A-line - s/p thrombectomy- staples intact  Left arm edema - have asked nurses to elevate it - no DVT on ultrasound on 8/23    Fatty infiltration of liver -  Currently weight and diabetes appear controlled    DM (diabetes mellitus), type 2  - A1c 5.8 on 8/10 - on Metformin at home- sugars well controlled here   DVT prophylaxis: heparin Code Status: Full code Family Communication:  Disposition Plan: possible LTACH as he failed swallow eval and  needs to keep NG for now RN to get patient out of bed daily to increase strength Consultants:   Neurology  PCCM  Vascular surgery  Gen surgery  Procedures  8/10- ex lap- patch repair of pyloric ucler  8/13- brachial embolectomy 2 D ECHO Left ventricle: The cavity size was moderately dilated. Wall   thickness was normal. Systolic function was severely reduced. The   estimated ejection fraction was 15%. Diffuse hypokinesis. There   is akinesis of the inferolateral and inferior  myocardium. The   study is not technically sufficient to allow evaluation of LV   diastolic function. - Aortic valve: There was mild regurgitation. - Mitral valve: There was moderate regurgitation. - Left atrium: The atrium was severely dilated. - Right ventricle: The cavity size was moderately dilated. Systolic   function was moderately reduced. - Right atrium: The atrium was severely dilated. - Tricuspid valve: There was moderate regurgitation. - Pulmonary arteries: Systolic pressure was moderately increased.  Antimicrobials:  Anti-infectives    Start     Dose/Rate Route Frequency Ordered Stop   12/13/16 1000  amoxicillin (AMOXIL) 250 MG/5ML suspension 1,000 mg     1,000 mg Per Tube Every 12 hours 12/13/16 0935 12/16/16 2359   12/12/16 1900  piperacillin-tazobactam (ZOSYN) IVPB 3.375 g  Status:  Discontinued     3.375 g 12.5 mL/hr over 240 Minutes Intravenous Every 8 hours 12/12/16 1759 12/13/16 0911   12/12/16 1400  Ampicillin-Sulbactam (UNASYN) 3 g in sodium chloride 0.9 % 100 mL IVPB  Status:  Discontinued     3 g 200 mL/hr over 30 Minutes Intravenous Every 6 hours 12/12/16 1215 12/12/16 1753   12/10/16 0600  amoxicillin (AMOXIL) 250 MG/5ML suspension 1,000 mg  Status:  Discontinued     1,000 mg Per Tube Every 12 hours 12/09/16 1701 12/12/16 1206   12/09/16 1130  amoxicillin (AMOXIL) 250 MG/5ML suspension 1,000 mg  Status:  Discontinued     1,000 mg Oral Every 12 hours 12/09/16 1117 12/09/16 1118   12/09/16 1130  amoxicillin (AMOXIL) 250 MG/5ML suspension 1,000 mg  Status:  Discontinued     1,000 mg Per Tube Every 12 hours 12/09/16 1118 12/09/16 1701   12/04/16 2200  clarithromycin (BIAXIN) tablet 500 mg     500 mg Per Tube Every 12 hours 12/04/16 1107 12/16/16 2359   12/04/16 2000  piperacillin-tazobactam (ZOSYN) IVPB 3.375 g     3.375 g 12.5 mL/hr over 240 Minutes Intravenous Every 8 hours 12/04/16 1339 12/08/16 2359   12/04/16 1600  metroNIDAZOLE (FLAGYL) tablet 500 mg   Status:  Discontinued     500 mg Per Tube Every 8 hours 12/04/16 1107 12/09/16 1117   12/02/16 1100  clarithromycin (BIAXIN) 250 MG/5ML suspension 500 mg  Status:  Discontinued     500 mg Per Tube Every 12 hours 12/02/16 1012 12/04/16 1107   12/02/16 1045  metroNIDAZOLE (FLAGYL) 50 mg/ml oral suspension 500 mg  Status:  Discontinued     500 mg Per Tube 3 times daily 12/02/16 1036 12/04/16 1107   12/02/16 1015  metroNIDAZOLE (FLAGYL) 50 mg/ml oral suspension 250 mg  Status:  Discontinued     250 mg Per Tube 4 times daily 12/02/16 1011 12/02/16 1036   11/28/16 0800  fluconazole (DIFLUCAN) IVPB 200 mg  Status:  Discontinued     200 mg 100 mL/hr over 60 Minutes Intravenous Every 24 hours 11/27/16 0624 12/02/16 1018   11/27/16 0630  fluconazole (DIFLUCAN) IVPB 400 mg  400 mg 100 mL/hr over 120 Minutes Intravenous  Once 11/27/16 0622 11/27/16 0841   11/27/16 0600  piperacillin-tazobactam (ZOSYN) IVPB 3.375 g  Status:  Discontinued     3.375 g 12.5 mL/hr over 240 Minutes Intravenous Every 8 hours 11/27/16 0534 12/04/16 1339   11/27/16 0515  piperacillin-tazobactam (ZOSYN) IVPB 3.375 g  Status:  Discontinued     3.375 g 100 mL/hr over 30 Minutes Intravenous  Once 11/27/16 0510 11/27/16 0518   11/27/16 0030  piperacillin-tazobactam (ZOSYN) IVPB 3.375 g     3.375 g 100 mL/hr over 30 Minutes Intravenous  Once 11/27/16 0016 11/27/16 0755   11/27/16 0030  vancomycin (VANCOCIN) IVPB 1000 mg/200 mL premix     1,000 mg 200 mL/hr over 60 Minutes Intravenous  Once 11/27/16 0016 11/27/16 0756       Objective: Vitals:   12/13/16 1842 12/13/16 2106 12/14/16 0639 12/14/16 0900  BP: (!) 101/51 (!) 100/53 121/70 124/81  Pulse: 76 73 (!) 107 85  Resp:  20 (!) 22 20  Temp: 98.2 F (36.8 C) 98 F (36.7 C) 98.5 F (36.9 C) 99.2 F (37.3 C)  TempSrc: Oral Oral Oral Axillary  SpO2:  100% 97% 100%  Weight: 82.3 kg (181 lb 6.4 oz)  81.8 kg (180 lb 6.4 oz)   Height: '6\' 1"'$  (1.854 m)        Intake/Output Summary (Last 24 hours) at 12/14/16 1151 Last data filed at 12/14/16 0909  Gross per 24 hour  Intake              680 ml  Output             1385 ml  Net             -705 ml   Filed Weights   12/13/16 0426 12/13/16 1842 12/14/16 0639  Weight: 84.7 kg (186 lb 12.8 oz) 82.3 kg (181 lb 6.4 oz) 81.8 kg (180 lb 6.4 oz)    Examination: General exam: Appears comfortable  HEENT: PERRLA, oral mucosa moist, no sclera icterus or thrush Respiratory system: Clear to auscultation. Respiratory effort normal. Cardiovascular system: S1 & S2 heard,  No murmurs  Gastrointestinal system: Abdomen soft, non-tender, nondistended. Normal bowel sound. No organomegaly- NG tube in place Central nervous system: Alert - oriented only to person. No focal neurological deficits. Extremities: No cyanosis, clubbing or edema Skin: No rashes or ulcers Psychiatry:  Mood & affect appropriate.     Data Reviewed: I have personally reviewed following labs and imaging studies  CBC:  Recent Labs Lab 12/10/16 0350 12/11/16 0305 12/12/16 0443 12/13/16 1338 12/14/16 0435  WBC 9.8 10.0 8.7 9.4 6.7  NEUTROABS 7.7 7.9* 6.8 8.2* 5.4  HGB 9.8* 9.9* 10.2* 10.9* 9.6*  HCT 31.2* 32.0* 33.8* 36.4* 31.3*  MCV 105.8* 107.7* 109.0* 109.6* 109.8*  PLT 455* 471* 456* 436* 324   Basic Metabolic Panel:  Recent Labs Lab 12/07/16 1800  12/08/16 0419 12/09/16 0358  12/09/16 1924 12/10/16 0350 12/11/16 0305 12/12/16 0443 12/13/16 1338 12/14/16 0435  NA 144  --  146* 149*  < > 149* 148* 153* 153* 154*  --   K 3.4*  --  3.2* 3.4*  < > 3.8 3.6 3.6 3.3* 4.0  --   CL 115*  --  114* 115*  < > 115* 114* 113* 117* 121*  --   CO2 22  --  23 25  < > '28 28 31 28 25  '$ --   GLUCOSE 143*  --  111* 99  < > 128* 116* 136* 125* 177*  --   BUN 34*  --  38* 41*  < > 41* 43* 47* 44* 51*  --   CREATININE 1.00  --  1.06 1.15  < > 1.09 1.04 1.06 0.95 0.99  --   CALCIUM 7.9*  --  8.0* 8.1*  < > 8.3* 8.2* 8.5* 8.5* 8.7*  --    MG  --   < > 1.7 1.8  --  2.1 2.1 2.0 2.0 2.1 2.3  PHOS 2.2*  --  3.1 3.5  --   --  3.0  --  2.9  --   --   < > = values in this interval not displayed. GFR: Estimated Creatinine Clearance: 75.1 mL/min (by C-G formula based on SCr of 0.99 mg/dL). Liver Function Tests:  Recent Labs Lab 12/07/16 1800 12/08/16 0419 12/09/16 0358 12/10/16 0350  ALBUMIN 1.7* 1.8* 1.8* 1.9*   No results for input(s): LIPASE, AMYLASE in the last 168 hours. No results for input(s): AMMONIA in the last 168 hours. Coagulation Profile: No results for input(s): INR, PROTIME in the last 168 hours. Cardiac Enzymes: No results for input(s): CKTOTAL, CKMB, CKMBINDEX, TROPONINI in the last 168 hours. BNP (last 3 results) No results for input(s): PROBNP in the last 8760 hours. HbA1C: No results for input(s): HGBA1C in the last 72 hours. CBG:  Recent Labs Lab 12/13/16 2124 12/14/16 0028 12/14/16 0327 12/14/16 0759 12/14/16 1138  GLUCAP 170* 102* 144* 98 185*   Lipid Profile: No results for input(s): CHOL, HDL, LDLCALC, TRIG, CHOLHDL, LDLDIRECT in the last 72 hours. Thyroid Function Tests: No results for input(s): TSH, T4TOTAL, FREET4, T3FREE, THYROIDAB in the last 72 hours. Anemia Panel: No results for input(s): VITAMINB12, FOLATE, FERRITIN, TIBC, IRON, RETICCTPCT in the last 72 hours. Urine analysis:    Component Value Date/Time   COLORURINE YELLOW 09/10/2016 [place] 09/10/2016 1002   LABSPEC 1.025 09/10/2016 1002   PHURINE 6.5 09/10/2016 1002   GLUCOSEU NEGATIVE 09/10/2016 1002   [place] 09/10/2016 1002   BILIRUBINUR SMALL (A) 09/10/2016 1002   KETONESUR NEGATIVE 09/10/2016 1002   PROTEINUR NEGATIVE 06/10/2010 0954   UROBILINOGEN >=8.0 (A) 09/10/2016 1002   NITRITE NEGATIVE 09/10/2016 1002   LEUKOCYTESUR NEGATIVE 09/10/2016 1002   Sepsis Labs: '@LABRCNTIP'$ (procalcitonin:4,lacticidven:4) ) Recent Results (from the past 240 hour(s))  Culture, Urine     Status: None    Collection Time: 12/04/16  7:32 PM  Result Value Ref Range Status   Specimen Description Urine  Final   Special Requests NONE  Final   Culture NO GROWTH  Final   Report Status 12/06/2016 FINAL  Final  Culture, blood (routine x 2)     Status: None   Collection Time: 12/04/16  8:48 PM  Result Value Ref Range Status   Specimen Description BLOOD LEFT HAND  Final   Special Requests   Final    BOTTLES DRAWN AEROBIC ONLY Blood Culture adequate volume   Culture NO GROWTH 5 DAYS  Final   Report Status 12/10/2016 FINAL  Final  Culture, blood (routine x 2)     Status: None   Collection Time: 12/04/16  9:01 PM  Result Value Ref Range Status   Specimen Description BLOOD RIGHT HAND  Final   Special Requests   Final    BOTTLES DRAWN AEROBIC ONLY Blood Culture adequate volume   Culture NO GROWTH 5 DAYS  Final   Report Status 12/10/2016 FINAL  Final  Culture, respiratory (NON-Expectorated)     Status: None   Collection Time: 12/05/16 12:30 AM  Result Value Ref Range Status   Specimen Description TRACHEAL ASPIRATE  Final   Special Requests NONE  Final   Gram Stain   Final    RARE WBC PRESENT,BOTH PMN AND MONONUCLEAR NO ORGANISMS SEEN    Culture NO GROWTH 2 DAYS  Final   Report Status 12/07/2016 FINAL  Final  Culture, Urine     Status: None   Collection Time: 12/10/16 11:58 AM  Result Value Ref Range Status   Specimen Description URINE, RANDOM  Final   Special Requests NONE  Final   Culture NO GROWTH  Final   Report Status 12/11/2016 FINAL  Final  Culture, blood (routine x 2)     Status: None (Preliminary result)   Collection Time: 12/10/16 12:39 PM  Result Value Ref Range Status   Specimen Description BLOOD RIGHT HAND  Final   Special Requests IN PEDIATRIC BOTTLE Blood Culture adequate volume  Final   Culture NO GROWTH 3 DAYS  Final   Report Status PENDING  Incomplete  Culture, blood (routine x 2)     Status: None (Preliminary result)   Collection Time: 12/10/16 12:39 PM  Result  Value Ref Range Status   Specimen Description BLOOD RIGHT HAND  Final   Special Requests IN PEDIATRIC BOTTLE Blood Culture adequate volume  Final   Culture NO GROWTH 3 DAYS  Final   Report Status PENDING  Incomplete         Radiology Studies: Ct Abdomen Pelvis W Contrast  Result Date: 12/13/2016 CLINICAL DATA:  71 year old male with sepsis, perforated duodenum bulb ulcer. Status post exploratory laparotomy, omental patch on 11/27/2016. EXAM: CT ABDOMEN AND PELVIS WITH CONTRAST TECHNIQUE: Multidetector CT imaging of the abdomen and pelvis was performed using the standard protocol following bolus administration of intravenous contrast. CONTRAST:  143m ISOVUE-300 IOPAMIDOL (ISOVUE-300) INJECTION 61% COMPARISON:  CTA chest abdomen and pelvis 11/27/2016 FINDINGS: Lower chest: Stable cardiomegaly. No pericardial effusion. Increased small layering right pleural effusion and right lower lobe atelectasis. Left lower lobe and right middle lobe ventilation is stable to mildly improved since 11/27/2016. Enteric tube courses to the stomach. Hepatobiliary: Regressed perihepatic fluid, now trace. There is a small 14 mm subcapsular low-density area along the posterior right hepatic lobe which appears to be new on series 3, image 25. The remaining liver parenchyma is within normal limits. Negative gallbladder. No biliary ductal enlargement. Pancreas: Negative. Spleen: Trace perisplenic fluid, otherwise negative. Adrenals/Urinary Tract: Normal adrenal glands. Occasional benign renal cysts. Bilateral renal contrast enhancement and excretion is within normal limits. Foley catheter removed from the urinary bladder. Mild bladder distension. Stomach/Bowel: Fluid intermittently within large bowel to the rectum. Oral contrast has reached the mid transverse colon. Redundant and moderately dilated sigmoid colon mostly containing gas. Diverticulosis at the junction of the sigmoid and descending colon. Upper limits of normal gas  and fluid or contrast filled left and transverse colon. Right colon appears normal aside from moderate diverticulosis. Diminutive or absent appendix. Negative terminal ileum. No dilated small bowel. Generalized mild to moderate mesenteric stranding along the greater omentum. Enteric tube courses to the distal gastric body. Continued small volume pneumoperitoneum about the gastric antrum (Series 3 images 21 and 22). The duodenum is decompressed. No retroperitoneal gas or fluid. There is a partially organized appearing fluid collection situated along the undersurface of the redundant sigmoid colon and adjacent to some otherwise normal appearing distal small bowel loops  which encompasses 79 x 59 x 83 mm (AP by transverse by CC). See series 3, image 60 in coronal image 35. Estimated volume of this dominant portion is 193 mL. This is contiguous with a small volume of fluid tracking into the cul-de-sac of the pelvis. The superficial aspect of this fluid abuts the ventral abdominal wall and wound. No gas identified within the collection. Occasional much smaller loculated collections, such as that along the ventral aspect of the transverse colon on series 3, image 29 and coronal image 20. These do not appear drainable. Vascular/Lymphatic: Extensive Aortoiliac calcified atherosclerosis. Major arterial structures in the abdomen and pelvis remain patent. Portal venous system is patent. Reproductive: Negative. Other: Ventral midline abdominal wound. Small volume of pelvic free fluid. Fat containing inguinal hernias are stable. Musculoskeletal: Stable visualized osseous structures. Thoracic spine ankylosis IMPRESSION: 1. Organizing fluid collection along the caudal aspect of redundant and distended proximal sigmoid colon in the mid abdomen is nonspecific but might represent a developing abscess. The dominant portion of this collection is estimated at 193 mL. This fluid is contiguous with a smaller volume of fluid tracking to the  pelvic cul-de-sac, and also abuts normal appearing small bowel loops. Occasional other tiny loculated appearing mesenteric fluid collections which are not drainable. 2. Small volume of persistent pneumoperitoneum near the gastric antrum. Generalized mild to moderate soft tissue stranding of the greater omentum. 3. Small volume perihepatic and perisplenic fluid. There is a new small subcapsular 15 mm collection at the posterior right liver. This is nonspecific, but a small liver abscess is difficult to exclude. 4. Increased small layering right pleural effusion. 5. NG tube terminates at the gastric body. Oral contrast has reached the transverse colon but there is probably a degree of large bowel ileus, maximal at the proximal sigmoid in #1. Electronically Signed   By: Genevie Ann M.D.   On: 12/13/2016 16:21   Dg Chest Port 1 View  Result Date: 12/13/2016 CLINICAL DATA:  Fever. EXAM: PORTABLE CHEST 1 VIEW COMPARISON:  12/10/2016 FINDINGS: Endotracheal tube was removed. Nasogastric tube extends into the abdomen. Again noted is a left cardiac ICD. Heart remains enlarged for size. Right jugular central line has been removed. Increased densities in the right lower chest. Upper lungs remain clear. Negative for a pneumothorax. IMPRESSION: Increased densities in the right lower chest. Findings may represent atelectasis. However, infection cannot be excluded based on history of fever. Recommend follow-up. Support apparatuses as described. Electronically Signed   By: Markus Daft M.D.   On: 12/13/2016 12:01   Dg Swallowing Func-speech Pathology  Result Date: 12/13/2016 Objective Swallowing Evaluation: Type of Study: MBS-Modified Barium Swallow Study Patient Details Name: Barbara Keng MRN: [id number] Date of Birth: 11/05/1945 Today's Date: 12/13/2016 Time: SLP Start Time (ACUTE ONLY): 1530-SLP Stop Time (ACUTE ONLY): 1540 SLP Time Calculation (min) (ACUTE ONLY): 10 min Past Medical History: Past Medical History: Diagnosis  Date . ANXIETY 02/04/2009 . CHF (congestive heart failure) ([place])  . Chronic systolic CHF (congestive heart failure) ([place])  . DIABETES MELLITUS, TYPE II 11/07/2008 . 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 HIP PAIN, RIGHT 11/06/2009 . HYPERLIPIDEMIA 11/07/2008 . HYPERTENSION 10/08/2008 . Nonischemic cardiomyopathy [facility])   s/p ICD implantation 03-2013 by Dr Lovena Le . OSTEOARTHRITIS, KNEE, RIGHT 10/08/2008 . Peripheral arterial disease ([place])  Past Surgical History: Past Surgical History: Procedure Laterality Date . CARDIAC DEFIBRILLATOR PLACEMENT Left 04/10/13  BTK single chamber ICD implanted by Dr Lovena Le for primary prevention . EMBOLECTOMY Right 11/30/2016  Procedure: BRACHIAL EMBOLECTOMY WITH VEIN  PATCH ANGIOPLASTY;  Surgeon: Elam Dutch, MD;  [facility] OR;  Service: Vascular;  Laterality: Right; . IMPLANTABLE CARDIOVERTER DEFIBRILLATOR IMPLANT N/A 04/10/2013  Procedure: IMPLANTABLE CARDIOVERTER DEFIBRILLATOR IMPLANT;  Surgeon: Evans Lance, MD;  Location: [facility] CATH LAB;  Service: Cardiovascular;  Laterality: N/A; . LAPAROTOMY N/A 11/27/2016  Procedure: EXPLORATORY LAPAROTOMY abdominal exploration oversew pyloric channel ulcer gram patch repair perforated ulcer;  Surgeon: Alphonsa Overall, MD;  Location: [facility] ORS;  Service: General;  Laterality: N/A; . s/p left broken arm with pin    1980's HPI: Pt is a 71 year old man with ischemic cardiomyopathy, DM, HTN, PAD, OSA who was admitted 8/9 with gastric ulcer perforation and peritonitis. He underwent emergent laparotomy with omental patch repair, postoperatively remained in shock requiring epinephrine and Levophed drip. Course c/b acute ischemia of R arm requiring tx from [facility] to [facility] and urgent OR embolectomy, also c/b slow vent wean. He was intubated 8/10-8/23. SLP ordered for swallowing evaluation post-extubation. Subjective: alert, cooperative Assessment / Plan / Recommendation CHL IP CLINICAL IMPRESSIONS 12/13/2016 Clinical Impression Patient presents with severe risk  for aspiration in the context of physical deconditioning after prolonged intubation. MBS limited due to pt's severe aspiration risk. Secretions appear to be thick, coating pt's posterior pharynx and laryngeal vestibule, visible via movement as pt breathing, coughing. SLP performed oral care and suctioning. With pureed solid, pt with prolonged oral phase, weak manipulation and delayed oral transit, initiating a weak pharyngeal swallow with moderately reduced tongue base retraction, minimal hyolaryngeal excursion, absent epiglottic deflection and reduced pharyngeal constriction. This led to retention of 100% of the bolus in vallecue. SLP cued pt for dry swallows, chin tuck, in an effort to facilitate bolus clearance, which were ineffective. As contrast pooled with pt's secretions, material spilled over pt's epiglottis and into the laryngeal vestibule. Pt intermittently sensed penetration/aspiration, though reflexive and cued cough not effective for airway clearance. Recommend pt remain NPO, with ice chips after oral care to facilitate use of swallowing musculature. Patient may benefit from RMT to strengthen cough, swallowing musculature. Will follow up for therapeutic exercise.  SLP Visit Diagnosis Dysphagia, oropharyngeal phase (R13.12) Attention and concentration deficit following -- Frontal lobe and executive function deficit following -- Impact on safety and function Severe aspiration risk   CHL IP TREATMENT RECOMMENDATION 12/13/2016 Treatment Recommendations F/U MBS in --- days (Comment)   Prognosis 12/13/2016 Prognosis for Safe Diet Advancement Good Barriers to Reach Goals Severity of deficits Barriers/Prognosis Comment -- CHL IP DIET RECOMMENDATION 12/13/2016 SLP Diet Recommendations NPO;Ice chips PRN after oral care;Alternative means - temporary Liquid Administration via -- Medication Administration Via alternative means Compensations -- Postural Changes --   CHL IP OTHER RECOMMENDATIONS 12/13/2016 Recommended  Consults -- Oral Care Recommendations Oral care QID;Other (Comment) Other Recommendations Have oral suction available;Remove water pitcher   CHL IP FOLLOW UP RECOMMENDATIONS 12/13/2016 Follow up Recommendations Other (comment)   CHL IP FREQUENCY AND DURATION 12/13/2016 Speech Therapy Frequency (ACUTE ONLY) min 2x/week Treatment Duration 2 weeks      CHL IP ORAL PHASE 12/13/2016 Oral Phase Impaired Oral - Pudding Teaspoon -- Oral - Pudding Cup -- Oral - Honey Teaspoon -- Oral - Honey Cup -- Oral - Nectar Teaspoon -- Oral - Nectar Cup -- Oral - Nectar Straw -- Oral - Thin Teaspoon Lingual pumping;Delayed oral transit;Reduced posterior propulsion;Weak lingual manipulation Oral - Thin Cup -- Oral - Thin Straw -- Oral - Puree Lingual pumping;Delayed oral transit;Piecemeal swallowing;Reduced posterior propulsion;Weak lingual manipulation Oral - Mech Soft -- Oral -  Regular -- Oral - Multi-Consistency -- Oral - Pill -- Oral Phase - Comment --  CHL IP PHARYNGEAL PHASE 12/13/2016 Pharyngeal Phase Impaired Pharyngeal- Pudding Teaspoon -- Pharyngeal -- Pharyngeal- Pudding Cup -- Pharyngeal -- Pharyngeal- Honey Teaspoon -- Pharyngeal -- Pharyngeal- Honey Cup -- Pharyngeal -- Pharyngeal- Nectar Teaspoon -- Pharyngeal -- Pharyngeal- Nectar Cup -- Pharyngeal -- Pharyngeal- Nectar Straw -- Pharyngeal -- Pharyngeal- Thin Teaspoon Delayed swallow initiation-vallecula;Reduced pharyngeal peristalsis;Reduced epiglottic inversion;Reduced anterior laryngeal mobility;Reduced laryngeal elevation;Reduced airway/laryngeal closure;Reduced tongue base retraction;Penetration/Aspiration before swallow;Penetration/Apiration after swallow;Trace aspiration;Pharyngeal residue - valleculae Pharyngeal Material enters airway, passes BELOW cords and not ejected out despite cough attempt by patient Pharyngeal- Thin Cup -- Pharyngeal -- Pharyngeal- Thin Straw -- Pharyngeal -- Pharyngeal- Puree Delayed swallow initiation-vallecula;Reduced pharyngeal  peristalsis;Reduced epiglottic inversion;Reduced anterior laryngeal mobility;Reduced laryngeal elevation;Reduced airway/laryngeal closure;Reduced tongue base retraction;Penetration/Apiration after swallow;Trace aspiration;Pharyngeal residue - valleculae;Pharyngeal residue - pyriform Pharyngeal Material enters airway, passes BELOW cords and not ejected out despite cough attempt by patient;Material enters airway, passes BELOW cords without attempt by patient to eject out (silent aspiration) Pharyngeal- Mechanical Soft -- Pharyngeal -- Pharyngeal- Regular -- Pharyngeal -- Pharyngeal- Multi-consistency -- Pharyngeal -- Pharyngeal- Pill -- Pharyngeal -- Pharyngeal Comment --  CHL IP CERVICAL ESOPHAGEAL PHASE 12/13/2016 Cervical Esophageal Phase Impaired Pudding Teaspoon -- Pudding Cup -- Honey Teaspoon -- Honey Cup -- Nectar Teaspoon -- Nectar Cup -- Nectar Straw -- Thin Teaspoon -- Thin Cup -- Thin Straw -- Puree Reduced cricopharyngeal relaxation Mechanical Soft -- Regular -- Multi-consistency -- Pill -- Cervical Esophageal Comment -- Deneise Lever, MS, CCC-SLP Speech-Language Pathologist [phone] No flowsheet data found. Aliene Altes 12/13/2016, 4:32 PM                 Scheduled Meds: . amoxicillin  1,000 mg Per Tube Q12H  . aspirin  81 mg Per Tube Daily  . chlorhexidine gluconate (MEDLINE KIT)  15 mL Mouth Rinse BID  . Chlorhexidine Gluconate Cloth  6 each Topical Daily  . clarithromycin  500 mg Per Tube Q12H  . feeding supplement (PRO-STAT SUGAR FREE 64)  60 mL Per Tube TID  . feeding supplement (VITAL HIGH PROTEIN)  1,000 mL Per Tube Q24H  . free water  200 mL Per Tube Q8H  . insulin aspart  0-20 Units Subcutaneous Q4H  . mouth rinse  15 mL Mouth Rinse QID  . metoprolol tartrate  25 mg Per Tube BID  . pantoprazole sodium  40 mg Per Tube BID  . rivaroxaban  20 mg Per Tube Daily  . sodium chloride flush  10-40 mL Intracatheter Q12H   Continuous Infusions: . sodium chloride 250 mL (12/13/16  1852)  . sodium chloride 500 mL (12/03/16 0126)     LOS: 17 days    Time spent in minutes: 35    Debbe Odea, MD [facility] Pager: [email or web address] Password [facility] 12/14/2016, 11:51 AM

## 2016-12-14 NOTE — Progress Notes (Signed)
  Speech Language Pathology Treatment: Dysphagia  Patient Details Name: Jacob Pittman MRN: [id number] DOB: 03/31/1946 Today's Date: 12/14/2016 Time: [id number] SLP Time Calculation (min) (ACUTE ONLY): 18 min  Assessment / Plan / Recommendation Clinical Impression  Jacob Pittman was lethargic requiring max and frequent multimodal cues to maintain an awake state. Oral hygiene performed prior to ice chip trials resulting in oral holding and mild manipulation. A pharyngeal swallow was not observed. Treatment also focused on increasing vocal cord adduction with attempts at volitional throat clear and coughs that he was not successful with this session. Encouraged daughter to ask pt to throat clear and cough when he is awake for 5 repetitions several times during the day. Will continue to assess for ability to participate in respiratory muscle strength training when more alert.   HPI HPI: Pt is a 71 year old man with ischemic cardiomyopathy, DM, HTN, PAD, OSA who was admitted 8/9 with gastric ulcer perforation and peritonitis. He underwent emergent laparotomy with omental patch repair, postoperatively remained in shock requiring epinephrine and Levophed drip. Course c/b acute ischemia of R arm requiring tx from [facility] to [facility] and urgent OR embolectomy, also c/b slow vent wean. He was intubated 8/10-8/23. SLP ordered for swallowing evaluation post-extubation.      SLP Plan  Continue with current plan of care       Recommendations  Diet recommendations: NPO Medication Administration: Via alternative means                Oral Care Recommendations: Oral care QID;Oral care prior to ice chip/H20 Follow up Recommendations: Skilled Nursing facility SLP Visit Diagnosis: Dysphagia, oropharyngeal phase (R13.12) Plan: Continue with current plan of care       GO                Jacob Pittman 12/14/2016, 1:36 PM   Jacob Pittman Jacob Pittman.Ed [employer] [phone]

## 2016-12-14 NOTE — Consult Note (Signed)
   [facility] Inpatient Consult   12/14/2016  Jacob Pittman 1946-04-19 [id number]   Patient assessed in the [facility] ACO for long length of stay. Patient reviewed about this 71 year old with an EF of 15%.  Recently transferred from ICU.  Notes reveals the patient is in need of a high level of care for disposition, for LTACH vs Skilled Nursing facility. Patient's primary care provider is Dr. Oliver Barre of [facility].  This office is listed to provide the transition of care calls and follow up.  No community needs noted or appropriate at this time.  Please contact if there are questions or needs changes:  Charlesetta Shanks, RN BSN CCM [facility]  [phone] business mobile phone Toll free office [phone]

## 2016-12-14 NOTE — Progress Notes (Signed)
Called into room by [facility], PT working with patient to assess NGT that was displaced. Vital infusing at 30 ml/hr held and pump turned off and tube disconnected from patient. Got charge nurse involved who advanced NGT to original marking. Stat DG Abd portable ordered.

## 2016-12-15 ENCOUNTER — Ambulatory Visit: Payer: Medicare PPO | Admitting: Gastroenterology

## 2016-12-15 LAB — BASIC METABOLIC PANEL
Anion gap: 11 (ref 5–15)
BUN: 42 mg/dL — AB (ref 6–20)
CHLORIDE: 122 mmol/L — AB (ref 101–111)
CO2: 25 mmol/L (ref 22–32)
CREATININE: 0.95 mg/dL (ref 0.61–1.24)
Calcium: 8.3 mg/dL — ABNORMAL LOW (ref 8.9–10.3)
GFR calc Af Amer: >60 mL/min (ref >60)
GFR calc non Af Amer: >60 mL/min (ref >60)
Glucose, Bld: 175 mg/dL — ABNORMAL HIGH (ref 65–99)
Potassium: 3.7 mmol/L (ref 3.5–5.1)
SODIUM: 158 mmol/L — AB (ref 135–145)

## 2016-12-15 LAB — CULTURE, BLOOD (ROUTINE X 2)
CULTURE: NO GROWTH
Culture: NO GROWTH
SPECIAL REQUESTS: ADEQUATE
Special Requests: ADEQUATE

## 2016-12-15 LAB — GLUCOSE, CAPILLARY
GLUCOSE-CAPILLARY: 196 mg/dL — AB (ref 65–99)
Glucose-Capillary: 141 mg/dL — ABNORMAL HIGH (ref 65–99)
Glucose-Capillary: 168 mg/dL — ABNORMAL HIGH (ref 65–99)
Glucose-Capillary: 185 mg/dL — ABNORMAL HIGH (ref 65–99)
Glucose-Capillary: 142 mg/dL — ABNORMAL HIGH (ref 65–99)
Glucose-Capillary: 154 mg/dL — ABNORMAL HIGH (ref 65–99)

## 2016-12-15 LAB — MAGNESIUM: Magnesium: 2.2 mg/dL (ref 1.7–2.4)

## 2016-12-15 MED ORDER — POTASSIUM CHLORIDE CRYS ER 20 MEQ PO TBCR
40.0000 meq | EXTENDED_RELEASE_TABLET | Freq: Once | ORAL | Status: DC
  Filled 2016-12-15: qty 2

## 2016-12-15 MED ORDER — POTASSIUM CHLORIDE 20 MEQ/15ML (10%) PO SOLN
40.0000 meq | Freq: Once | ORAL | Status: AC
  Administered 2016-12-15: 40 meq
  Filled 2016-12-15: qty 30

## 2016-12-15 NOTE — Progress Notes (Signed)
15 Days Post-Op    ZE:[id number] pain  Subjective: He is wet this AM. Saw him with Dr. Wynelle Cleveland. He is only getting OOB with PT, Dr. Wynelle Cleveland discussed with the Director of the unit about that. His open wound is OK, still has allot of necrotic tissue at the base.  Will increase dressing changes to TId.  I have ask the to clean and work on the base be debriding with each dressing change.  Tolerating TF.   Objective: Vital signs in last 24 hours: Temp:  [97.5 F (36.4 C)-99.2 F (37.3 C)] 97.5 F (36.4 C) (08/28 0410) Pulse Rate:  [82-98] 82 (08/28 0410) Resp:  [20-37] 22 (08/28 0410) BP: (102-124)/(55-88) 105/55 (08/28 0410) SpO2:  [98 %-100 %] 100 % (08/28 0410) Weight:  [81.1 kg (178 lb 14.4 oz)] 81.1 kg (178 lb 14.4 oz) (08/28 0410) Last BM Date: 12/13/16 NPO NG 1892 tube feedings Urine 1030 Emesis - 100 recorded Afebrile, VSS NA 158/CL 122 Glucose 122 Intake/Output from previous day: 08/27 0701 - 08/28 0700 In: 1961.5 [I.V.:68.8; NG/GT:1892.7] Out: 1130 [Urine:1030; Emesis/NG output:100] Intake/Output this shift: No intake/output data recorded.  General appearance: alert, cooperative and no distress Resp: rales and ronchi bilat.  Sounds wet GI: still a bit distended, tolerting TF. Nothing about stool in I/O.  Open wound about the same as the weekend.    Lab Results:   Recent Labs  12/13/16 1338 12/14/16 0435  WBC 9.4 6.7  HGB 10.9* 9.6*  HCT 36.4* 31.3*  PLT 436* 381    BMET  Recent Labs  12/13/16 1338 12/15/16 0511  NA 154* 158*  K 4.0 3.7  CL 121* 122*  CO2 25 25  GLUCOSE 177* 175*  BUN 51* 42*  CREATININE 0.99 0.95  CALCIUM 8.7* 8.3*   PT/INR No results for input(s): LABPROT, INR in the last 72 hours.   Recent Labs Lab 12/09/16 0358 12/10/16 0350  ALBUMIN 1.8* 1.9*     Lipase     Component Value Date/Time   LIPASE 20 12/01/2016 0304     Medications: . amoxicillin  1,000 mg Per Tube Q12H  . aspirin  81 mg Per Tube Daily  .  chlorhexidine gluconate (MEDLINE KIT)  15 mL Mouth Rinse BID  . Chlorhexidine Gluconate Cloth  6 each Topical Daily  . clarithromycin  500 mg Per Tube Q12H  . feeding supplement (PRO-STAT SUGAR FREE 64)  30 mL Per Tube BID  . free water  200 mL Per Tube Q8H  . insulin aspart  0-20 Units Subcutaneous Q4H  . mouth rinse  15 mL Mouth Rinse QID  . metoprolol tartrate  25 mg Per Tube BID  . pantoprazole sodium  40 mg Per Tube BID  . rivaroxaban  20 mg Per Tube Daily  . sodium chloride flush  10-40 mL Intracatheter Q12H   . sodium chloride Stopped (12/14/16 0953)  . sodium chloride 500 mL (12/03/16 0126)  . feeding supplement (VITAL 1.5 CAL) 1,000 mL (12/15/16 0454)   Anti-infectives    Start     Dose/Rate Route Frequency Ordered Stop   12/13/16 1000  amoxicillin (AMOXIL) 250 MG/5ML suspension 1,000 mg     1,000 mg Per Tube Every 12 hours 12/13/16 0935 12/16/16 2359   12/12/16 1900  piperacillin-tazobactam (ZOSYN) IVPB 3.375 g  Status:  Discontinued     3.375 g 12.5 mL/hr over 240 Minutes Intravenous Every 8 hours 12/12/16 1759 12/13/16 0911   12/12/16 1400  Ampicillin-Sulbactam (UNASYN) 3 g in sodium  chloride 0.9 % 100 mL IVPB  Status:  Discontinued     3 g 200 mL/hr over 30 Minutes Intravenous Every 6 hours 12/12/16 1215 12/12/16 1753   12/10/16 0600  amoxicillin (AMOXIL) 250 MG/5ML suspension 1,000 mg  Status:  Discontinued     1,000 mg Per Tube Every 12 hours 12/09/16 1701 12/12/16 1206   12/09/16 1130  amoxicillin (AMOXIL) 250 MG/5ML suspension 1,000 mg  Status:  Discontinued     1,000 mg Oral Every 12 hours 12/09/16 1117 12/09/16 1118   12/09/16 1130  amoxicillin (AMOXIL) 250 MG/5ML suspension 1,000 mg  Status:  Discontinued     1,000 mg Per Tube Every 12 hours 12/09/16 1118 12/09/16 1701   12/04/16 2200  clarithromycin (BIAXIN) tablet 500 mg     500 mg Per Tube Every 12 hours 12/04/16 1107 12/16/16 2359   12/04/16 2000  piperacillin-tazobactam (ZOSYN) IVPB 3.375 g     3.375  g 12.5 mL/hr over 240 Minutes Intravenous Every 8 hours 12/04/16 1339 12/08/16 2359   12/04/16 1600  metroNIDAZOLE (FLAGYL) tablet 500 mg  Status:  Discontinued     500 mg Per Tube Every 8 hours 12/04/16 1107 12/09/16 1117   12/02/16 1100  clarithromycin (BIAXIN) 250 MG/5ML suspension 500 mg  Status:  Discontinued     500 mg Per Tube Every 12 hours 12/02/16 1012 12/04/16 1107   12/02/16 1045  metroNIDAZOLE (FLAGYL) 50 mg/ml oral suspension 500 mg  Status:  Discontinued     500 mg Per Tube 3 times daily 12/02/16 1036 12/04/16 1107   12/02/16 1015  metroNIDAZOLE (FLAGYL) 50 mg/ml oral suspension 250 mg  Status:  Discontinued     250 mg Per Tube 4 times daily 12/02/16 1011 12/02/16 1036   11/28/16 0800  fluconazole (DIFLUCAN) IVPB 200 mg  Status:  Discontinued     200 mg 100 mL/hr over 60 Minutes Intravenous Every 24 hours 11/27/16 0624 12/02/16 1018   11/27/16 0630  fluconazole (DIFLUCAN) IVPB 400 mg     400 mg 100 mL/hr over 120 Minutes Intravenous  Once 11/27/16 0622 11/27/16 0841   11/27/16 0600  piperacillin-tazobactam (ZOSYN) IVPB 3.375 g  Status:  Discontinued     3.375 g 12.5 mL/hr over 240 Minutes Intravenous Every 8 hours 11/27/16 0534 12/04/16 1339   11/27/16 0515  piperacillin-tazobactam (ZOSYN) IVPB 3.375 g  Status:  Discontinued     3.375 g 100 mL/hr over 30 Minutes Intravenous  Once 11/27/16 0510 11/27/16 0518   11/27/16 0030  piperacillin-tazobactam (ZOSYN) IVPB 3.375 g     3.375 g 100 mL/hr over 30 Minutes Intravenous  Once 11/27/16 0016 11/27/16 0755   11/27/16 0030  vancomycin (VANCOCIN) IVPB 1000 mg/200 mL premix     1,000 mg 200 mL/hr over 60 Minutes Intravenous  Once 11/27/16 0016 11/27/16 0756      Assessment/Plan Perforated pyloric ulcer  POD#16 S/P EXPLORATORY LAPAROTOMY, GRAHAM PATCH REPAIR8/10/18 Dr. Alphonsa Overall H Pylori Positive/C diff negative Shock - resolved Acute encephalopathy- prolong ventilator support Biventricular heart failure EF  15%/AICD PAF Right hand ischemia - right brachial/ulnar/radial artery embolectomies, 11/30/16, Dr. Oneida Alar ? CVA Hypernatremia/hyperchloremia - Free water 200 ml q8h Dysphagia Type II diabetes Acute renal failure - resolving FEN: IV fluids/npo/tube feedings ID:  zosyn 8/9- 12/08/16,  Diflucan 8/9-8/15/18, clarithromycin 8/17 - 8/29  Amoxicillin 8/23-29/18  =>> day 15 of antibiotics  DVT: SCD/heparin =>> Xarelto per TF  Plan:  Continue local wound care.  Dr. Wynelle Cleveland is following closely. She is  [facility]. Consider Coretrack feeding tube in place of the current NG.          LOS: 18 days    Jacob Pittman 12/15/2016 [phone]

## 2016-12-15 NOTE — Progress Notes (Signed)
LTAC referral placed, awaiting on insurance authorization from [facility]; Leonard Schwartz [place] RN,MHA,BSN [id number]

## 2016-12-15 NOTE — Progress Notes (Addendum)
PROGRESS NOTE    Jacob Pittman   YIR:[id number]  DOB: June 15, 1945  DOA: 11/26/2016 PCP: Biagio Borg, MD   Brief Narrative:  [name] 71 y/o male with EF 15% (ischemic CMP) s/p AICD, HTN, DM, PAD presented on 8/9 with abdominal pain, LA 9.4 and a bowel perforation. Was found to have a perforated pyloric channel ulcer and pneumoperitonitis s/p omental patch repair subsequently on epinephrine and Levophed.  8/12-  TTE LV moderately dilated with EF 15% &diffuse hypokinesis. There is akinesis of the inferolateral and inferior myocardium.  E coli UTI noted 8/13-  right brachial A- line placed and then developed ischemic right arm- vascular consult and transfer from [facility] to [facility]- thrombectomy of brachial artery- heparin Paroxysmal A-fib also noted 8/14- Decreased attenuation involving the right dentate nucleus in the right cerebellum and immediately adjacent cerebellar parenchyma, concerning for recent and potentially acute infarct in this area.  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 No complaints. Confused. More congested today. NG tube got dislodged yesterday and had to be re-inserted.    Assessment & Plan:   Principal Problem:   Perforated duodenal bulb ulcer/ peritonitis/   H pylori ulcer  Septic shock  Vancomycin 8/10 (x1 dose) Diflucan 8/10 - 8/15 Zosyn 8/10 > 8/21  Flagyl 8/15 > 8/22  Biaxin 8/15 >>  Amoxicillin 8/22 >> on 8/25 >>Unasyn>>Zosyn - BID PPI-  - 8/25  spiking fevers up to 103- cultures negative-  changed amoxil to Unasyn and then Zosyn - central line removed and fevers resolved - 8/26- changed Zosyn back to Amoxil    - 8/27 - no recurrence of fever- CT abd/pelvis and CXR ordered by gen surgery as they felt he may have another source but I still feel the source was the central line. 8/28 - temp 99, a bit congested by not in resp distress or hypoxic. As NG got dislodged while tube feeds were running, will need to follow for symptoms of aspiration pneumonia- no change in antibiotics today. - per SLP, still needs to by NPO and will need to cont feeding per NG. ? coretrack for long term use. Discuss with gen surgery and will defer to them. - wound is clean- have notified surgey that we are having him assessed for [facility].   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   Nonischemic cardiomyopathy  AICD - Ef 15 %, RV failure, mod pulm HTN- follow with Dr Lovena Le - weight: 90 on admission >>> 84 >> 81 now - hypernatremia: he was fluid overloaded and was then diuresed so I was holding off on giving him free water to bring his sodium level down- gen surgery increased free water yesterday to 200 Q8 - follow weights and I and O carefully and diurese if needed -total I and O inaccurate as tube feeds were initially not being recorded - on Metoprolol- BP  borderline- hold off on ACE I for now    Acute respiratory failure with hypoxia  - in relation to sepsis- extubated and stable     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 - on baby aspirin  - Heparin>> Xarelto    Arterial thrombosis - right brachial occurring after A-line - s/p thrombectomy- staples intact  Left arm edema - have asked nurses to elevate it - no DVT on ultrasound on 8/23    Fatty infiltration of liver -  Currently weight and diabetes appear  controlled    DM (diabetes mellitus), type 2  - A1c 5.8 on 8/10 - on Metformin at home- sugars well controlled here   DVT prophylaxis: heparin Code Status: Full code Family Communication: daughter [name] Disposition Plan: possible LTACH as he failed swallow eval and needs to keep NG for now-  RN to get patient out of bed daily to increase strength Consultants:   Neurology  PCCM  Vascular surgery  Gen surgery  Procedures  8/10- ex lap- patch repair of pyloric ucler  8/13- brachial embolectomy 2 D ECHO Left ventricle: The cavity size was moderately dilated. Wall   thickness was normal. Systolic function was severely reduced. The   estimated ejection fraction was 15%. Diffuse hypokinesis. There   is akinesis of the inferolateral and inferior myocardium. The   study is not technically sufficient to allow evaluation of LV   diastolic function. - Aortic valve: There was mild regurgitation. - Mitral valve: There was moderate regurgitation. - Left atrium: The atrium was severely dilated. - Right ventricle: The cavity size was moderately dilated. Systolic   function was moderately reduced. - Right atrium: The atrium was severely dilated. - Tricuspid valve: There was moderate regurgitation. - Pulmonary arteries: Systolic pressure was moderately increased.  Antimicrobials:  Anti-infectives    Start     Dose/Rate Route Frequency Ordered Stop   12/13/16 1000  amoxicillin (AMOXIL) 250 MG/5ML suspension 1,000 mg     1,000 mg Per Tube Every 12 hours 12/13/16 0935 12/16/16 2359   12/12/16 1900  piperacillin-tazobactam (ZOSYN) IVPB 3.375 g  Status:  Discontinued     3.375 g 12.5 mL/hr over 240 Minutes Intravenous Every 8 hours 12/12/16 1759 12/13/16 0911   12/12/16 1400  Ampicillin-Sulbactam (UNASYN) 3 g in sodium chloride 0.9 % 100 mL IVPB  Status:  Discontinued     3 g 200 mL/hr over 30 Minutes Intravenous Every 6 hours 12/12/16 1215 12/12/16 1753   12/10/16 0600  amoxicillin  (AMOXIL) 250 MG/5ML suspension 1,000 mg  Status:  Discontinued     1,000 mg Per Tube Every 12 hours 12/09/16 1701 12/12/16 1206   12/09/16 1130  amoxicillin (AMOXIL) 250 MG/5ML suspension 1,000 mg  Status:  Discontinued     1,000 mg Oral Every 12 hours 12/09/16 1117 12/09/16 1118   12/09/16 1130  amoxicillin (AMOXIL) 250 MG/5ML suspension 1,000 mg  Status:  Discontinued     1,000 mg Per Tube Every 12 hours 12/09/16 1118 12/09/16 1701   12/04/16 2200  clarithromycin (BIAXIN) tablet 500 mg     500 mg Per Tube Every 12 hours 12/04/16 1107 12/16/16 2359   12/04/16 2000  piperacillin-tazobactam (ZOSYN) IVPB 3.375 g     3.375 g 12.5 mL/hr over 240 Minutes Intravenous Every 8 hours 12/04/16 1339 12/08/16 2359   12/04/16 1600  metroNIDAZOLE (FLAGYL) tablet 500 mg  Status:  Discontinued     500 mg Per Tube Every 8 hours 12/04/16  1107 12/09/16 1117   12/02/16 1100  clarithromycin (BIAXIN) 250 MG/5ML suspension 500 mg  Status:  Discontinued     500 mg Per Tube Every 12 hours 12/02/16 1012 12/04/16 1107   12/02/16 1045  metroNIDAZOLE (FLAGYL) 50 mg/ml oral suspension 500 mg  Status:  Discontinued     500 mg Per Tube 3 times daily 12/02/16 1036 12/04/16 1107   12/02/16 1015  metroNIDAZOLE (FLAGYL) 50 mg/ml oral suspension 250 mg  Status:  Discontinued     250 mg Per Tube 4 times daily 12/02/16 1011 12/02/16 1036   11/28/16 0800  fluconazole (DIFLUCAN) IVPB 200 mg  Status:  Discontinued     200 mg 100 mL/hr over 60 Minutes Intravenous Every 24 hours 11/27/16 0624 12/02/16 1018   11/27/16 0630  fluconazole (DIFLUCAN) IVPB 400 mg     400 mg 100 mL/hr over 120 Minutes Intravenous  Once 11/27/16 0622 11/27/16 0841   11/27/16 0600  piperacillin-tazobactam (ZOSYN) IVPB 3.375 g  Status:  Discontinued     3.375 g 12.5 mL/hr over 240 Minutes Intravenous Every 8 hours 11/27/16 0534 12/04/16 1339   11/27/16 0515  piperacillin-tazobactam (ZOSYN) IVPB 3.375 g  Status:  Discontinued     3.375 g 100 mL/hr over 30  Minutes Intravenous  Once 11/27/16 0510 11/27/16 0518   11/27/16 0030  piperacillin-tazobactam (ZOSYN) IVPB 3.375 g     3.375 g 100 mL/hr over 30 Minutes Intravenous  Once 11/27/16 0016 11/27/16 0755   11/27/16 0030  vancomycin (VANCOCIN) IVPB 1000 mg/200 mL premix     1,000 mg 200 mL/hr over 60 Minutes Intravenous  Once 11/27/16 0016 11/27/16 0756       Objective: Vitals:   12/14/16 0900 12/14/16 1400 12/14/16 2009 12/15/16 0410  BP: 124/81 102/77 114/88 (!) 105/55  Pulse: 85 98 98 82  Resp: 20 (!) 37 (!) 24 (!) 22  Temp: 99.2 F (37.3 C) 98.9 F (37.2 C) 99.1 F (37.3 C) (!) 97.5 F (36.4 C)  TempSrc: Axillary Axillary Oral Oral  SpO2: 100%  98% 100%  Weight:    81.1 kg (178 lb 14.4 oz)  Height:        Intake/Output Summary (Last 24 hours) at 12/15/16 0841 Last data filed at 12/15/16 0522  Gross per 24 hour  Intake          1961.49 ml  Output             1130 ml  Net           831.49 ml   Filed Weights   12/13/16 1842 12/14/16 0639 12/15/16 0410  Weight: 82.3 kg (181 lb 6.4 oz) 81.8 kg (180 lb 6.4 oz) 81.1 kg (178 lb 14.4 oz)    Examination: General exam: Appears comfortable  HEENT: PERRLA, oral mucosa moist, no sclera icterus or thrush Respiratory system: Clear to auscultation. Respiratory effort normal.- moderate upper airway congestion Cardiovascular system: S1 & S2 heard,  No murmurs  Gastrointestinal system: Abdomen soft, non-tender, nondistended. Normal bowel sound. No organomegaly- NG tube intact Central nervous system: Alert and oriented to person. No focal neurological deficits. Extremities: No cyanosis, clubbing or edema Skin: No rashes or ulcers Psychiatry:  flat affect    Data Reviewed: I have personally reviewed following labs and imaging studies  CBC:  Recent Labs Lab 12/10/16 0350 12/11/16 0305 12/12/16 0443 12/13/16 1338 12/14/16 0435  WBC 9.8 10.0 8.7 9.4 6.7  NEUTROABS 7.7 7.9* 6.8 8.2* 5.4  HGB 9.8* 9.9* 10.2* 10.9*  9.6*  HCT  31.2* 32.0* 33.8* 36.4* 31.3*  MCV 105.8* 107.7* 109.0* 109.6* 109.8*  PLT 455* 471* 456* 436* 650   Basic Metabolic Panel:  Recent Labs Lab 12/09/16 0358  12/10/16 0350 12/11/16 0305 12/12/16 0443 12/13/16 1338 12/14/16 0435 12/15/16 0511  NA 149*  < > 148* 153* 153* 154*  --  158*  K 3.4*  < > 3.6 3.6 3.3* 4.0  --  3.7  CL 115*  < > 114* 113* 117* 121*  --  122*  CO2 25  < > '28 31 28 25  '$ --  25  GLUCOSE 99  < > 116* 136* 125* 177*  --  175*  BUN 41*  < > 43* 47* 44* 51*  --  42*  CREATININE 1.15  < > 1.04 1.06 0.95 0.99  --  0.95  CALCIUM 8.1*  < > 8.2* 8.5* 8.5* 8.7*  --  8.3*  MG 1.8  < > 2.1 2.0 2.0 2.1 2.3  --   PHOS 3.5  --  3.0  --  2.9  --   --   --   < > = values in this interval not displayed. GFR: Estimated Creatinine Clearance: 78.3 mL/min (by C-G formula based on SCr of 0.95 mg/dL). Liver Function Tests:  Recent Labs Lab 12/09/16 0358 12/10/16 0350  ALBUMIN 1.8* 1.9*   No results for input(s): LIPASE, AMYLASE in the last 168 hours. No results for input(s): AMMONIA in the last 168 hours. Coagulation Profile: No results for input(s): INR, PROTIME in the last 168 hours. Cardiac Enzymes: No results for input(s): CKTOTAL, CKMB, CKMBINDEX, TROPONINI in the last 168 hours. BNP (last 3 results) No results for input(s): PROBNP in the last 8760 hours. HbA1C: No results for input(s): HGBA1C in the last 72 hours. CBG:  Recent Labs Lab 12/14/16 1542 12/14/16 2054 12/15/16 0023 12/15/16 0414 12/15/16 0815  GLUCAP 91 166* 196* 141* 168*   Lipid Profile: No results for input(s): CHOL, HDL, LDLCALC, TRIG, CHOLHDL, LDLDIRECT in the last 72 hours. Thyroid Function Tests: No results for input(s): TSH, T4TOTAL, FREET4, T3FREE, THYROIDAB in the last 72 hours. Anemia Panel: No results for input(s): VITAMINB12, FOLATE, FERRITIN, TIBC, IRON, RETICCTPCT in the last 72 hours. Urine analysis:    Component Value Date/Time   COLORURINE YELLOW 09/10/2016 [place] 09/10/2016 1002   LABSPEC 1.025 09/10/2016 1002   PHURINE 6.5 09/10/2016 1002   GLUCOSEU NEGATIVE 09/10/2016 1002   [place] 09/10/2016 1002   [place] (A) 09/10/2016 1002   KETONESUR NEGATIVE 09/10/2016 1002   PROTEINUR NEGATIVE 06/10/2010 0954   UROBILINOGEN >=8.0 (A) 09/10/2016 1002   NITRITE NEGATIVE 09/10/2016 [place] 09/10/2016 1002   Sepsis Labs: '@LABRCNTIP'$ (procalcitonin:4,lacticidven:4) ) Recent Results (from the past 240 hour(s))  Culture, Urine     Status: None   Collection Time: 12/10/16 11:58 AM  Result Value Ref Range Status   Specimen Description URINE, RANDOM  Final   Special Requests NONE  Final   Culture NO GROWTH  Final   Report Status 12/11/2016 FINAL  Final  Culture, blood (routine x 2)     Status: None (Preliminary result)   Collection Time: 12/10/16 12:39 PM  Result Value Ref Range Status   Specimen Description BLOOD RIGHT HAND  Final   Special Requests IN PEDIATRIC BOTTLE Blood Culture adequate volume  Final   Culture NO GROWTH 4 DAYS  Final   Report Status PENDING  Incomplete  Culture, blood (  routine x 2)     Status: None (Preliminary result)   Collection Time: 12/10/16 12:39 PM  Result Value Ref Range Status   Specimen Description BLOOD RIGHT HAND  Final   Special Requests IN PEDIATRIC BOTTLE Blood Culture adequate volume  Final   Culture NO GROWTH 4 DAYS  Final   Report Status PENDING  Incomplete         Radiology Studies: Ct Abdomen Pelvis W Contrast  Result Date: 12/13/2016 CLINICAL DATA:  71 year old male with sepsis, perforated duodenum bulb ulcer. Status post exploratory laparotomy, omental patch on 11/27/2016. EXAM: CT ABDOMEN AND PELVIS WITH CONTRAST TECHNIQUE: Multidetector CT imaging of the abdomen and pelvis was performed using the standard protocol following bolus administration of intravenous contrast. CONTRAST:  119m ISOVUE-300 IOPAMIDOL (ISOVUE-300) INJECTION 61% COMPARISON:   CTA chest abdomen and pelvis 11/27/2016 FINDINGS: Lower chest: Stable cardiomegaly. No pericardial effusion. Increased small layering right pleural effusion and right lower lobe atelectasis. Left lower lobe and right middle lobe ventilation is stable to mildly improved since 11/27/2016. Enteric tube courses to the stomach. Hepatobiliary: Regressed perihepatic fluid, now trace. There is a small 14 mm subcapsular low-density area along the posterior right hepatic lobe which appears to be new on series 3, image 25. The remaining liver parenchyma is within normal limits. Negative gallbladder. No biliary ductal enlargement. Pancreas: Negative. Spleen: Trace perisplenic fluid, otherwise negative. Adrenals/Urinary Tract: Normal adrenal glands. Occasional benign renal cysts. Bilateral renal contrast enhancement and excretion is within normal limits. Foley catheter removed from the urinary bladder. Mild bladder distension. Stomach/Bowel: Fluid intermittently within large bowel to the rectum. Oral contrast has reached the mid transverse colon. Redundant and moderately dilated sigmoid colon mostly containing gas. Diverticulosis at the junction of the sigmoid and descending colon. Upper limits of normal gas and fluid or contrast filled left and transverse colon. Right colon appears normal aside from moderate diverticulosis. Diminutive or absent appendix. Negative terminal ileum. No dilated small bowel. Generalized mild to moderate mesenteric stranding along the greater omentum. Enteric tube courses to the distal gastric body. Continued small volume pneumoperitoneum about the gastric antrum (Series 3 images 21 and 22). The duodenum is decompressed. No retroperitoneal gas or fluid. There is a partially organized appearing fluid collection situated along the undersurface of the redundant sigmoid colon and adjacent to some otherwise normal appearing distal small bowel loops which encompasses 79 x 59 x 83 mm (AP by transverse by  CC). See series 3, image 60 in coronal image 35. Estimated volume of this dominant portion is 193 mL. This is contiguous with a small volume of fluid tracking into the cul-de-sac of the pelvis. The superficial aspect of this fluid abuts the ventral abdominal wall and wound. No gas identified within the collection. Occasional much smaller loculated collections, such as that along the ventral aspect of the transverse colon on series 3, image 29 and coronal image 20. These do not appear drainable. Vascular/Lymphatic: Extensive Aortoiliac calcified atherosclerosis. Major arterial structures in the abdomen and pelvis remain patent. Portal venous system is patent. Reproductive: Negative. Other: Ventral midline abdominal wound. Small volume of pelvic free fluid. Fat containing inguinal hernias are stable. Musculoskeletal: Stable visualized osseous structures. Thoracic spine ankylosis IMPRESSION: 1. Organizing fluid collection along the caudal aspect of redundant and distended proximal sigmoid colon in the mid abdomen is nonspecific but might represent a developing abscess. The dominant portion of this collection is estimated at 193 mL. This fluid is contiguous with a smaller volume of fluid tracking to  the pelvic cul-de-sac, and also abuts normal appearing small bowel loops. Occasional other tiny loculated appearing mesenteric fluid collections which are not drainable. 2. Small volume of persistent pneumoperitoneum near the gastric antrum. Generalized mild to moderate soft tissue stranding of the greater omentum. 3. Small volume perihepatic and perisplenic fluid. There is a new small subcapsular 15 mm collection at the posterior right liver. This is nonspecific, but a small liver abscess is difficult to exclude. 4. Increased small layering right pleural effusion. 5. NG tube terminates at the gastric body. Oral contrast has reached the transverse colon but there is probably a degree of large bowel ileus, maximal at the  proximal sigmoid in #1. Electronically Signed   By: Genevie Ann M.D.   On: 12/13/2016 16:21   Dg Chest Port 1 View  Result Date: 12/13/2016 CLINICAL DATA:  Fever. EXAM: PORTABLE CHEST 1 VIEW COMPARISON:  12/10/2016 FINDINGS: Endotracheal tube was removed. Nasogastric tube extends into the abdomen. Again noted is a left cardiac ICD. Heart remains enlarged for size. Right jugular central line has been removed. Increased densities in the right lower chest. Upper lungs remain clear. Negative for a pneumothorax. IMPRESSION: Increased densities in the right lower chest. Findings may represent atelectasis. However, infection cannot be excluded based on history of fever. Recommend follow-up. Support apparatuses as described. Electronically Signed   By: Markus Daft M.D.   On: 12/13/2016 12:01   Dg Abd Portable 1v  Result Date: 12/14/2016 CLINICAL DATA:  NG tube placement. EXAM: PORTABLE ABDOMEN - 1 VIEW COMPARISON:  CT abdomen pelvis dated December 13, 2016. FINDINGS: NG tube seen with the tip and distal side port in the gastric body. Unchanged mildly dilated loop of air-filled sigmoid colon. Enteric contrast is noted in the right and left colon. IMPRESSION: 1. Enteric tube in good position in the gastric body. 2. Unchanged focal dilatation of the sigmoid colon. Electronically Signed   By: Titus Dubin M.D.   On: 12/14/2016 16:42   Dg Swallowing Func-speech Pathology  Result Date: 12/13/2016 Objective Swallowing Evaluation: Type of Study: MBS-Modified Barium Swallow Study Patient Details Name: Harding Thomure MRN: [id number] Date of Birth: 27-Dec-1945 Today's Date: 12/13/2016 Time: SLP Start Time (ACUTE ONLY): 1530-SLP Stop Time (ACUTE ONLY): 1540 SLP Time Calculation (min) (ACUTE ONLY): 10 min Past Medical History: Past Medical History: Diagnosis Date . ANXIETY 02/04/2009 . CHF (congestive heart failure) ([place])  . Chronic systolic CHF (congestive heart failure) ([place])  . DIABETES MELLITUS, TYPE II 11/07/2008 . 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 HIP PAIN, RIGHT 11/06/2009 . HYPERLIPIDEMIA 11/07/2008 . HYPERTENSION 10/08/2008 . Nonischemic cardiomyopathy [facility])   s/p ICD implantation 03-2013 by Dr Lovena Le . OSTEOARTHRITIS, KNEE, RIGHT 10/08/2008 . Peripheral arterial disease ([place])  Past Surgical History: Past Surgical History: Procedure Laterality Date . CARDIAC DEFIBRILLATOR PLACEMENT Left 04/10/13  BTK single chamber ICD implanted by Dr Lovena Le for primary prevention . EMBOLECTOMY Right 11/30/2016  Procedure: BRACHIAL EMBOLECTOMY WITH VEIN PATCH ANGIOPLASTY;  Surgeon: Elam Dutch, MD;  Location: [place];  Service: Vascular;  Laterality: Right; . IMPLANTABLE CARDIOVERTER DEFIBRILLATOR IMPLANT N/A 04/10/2013  Procedure: IMPLANTABLE CARDIOVERTER DEFIBRILLATOR IMPLANT;  Surgeon: Evans Lance, MD;  [facility]: [facility] C[facility] LAB;  Service: Cardiovascular;  Laterality: N/A; . LAPAROTOMY N/A 11/27/2016  Procedure: EXPLORATORY LAPAROTOMY abdominal exploration oversew pyloric channel ulcer gram patch repair perforated ulcer;  Surgeon: Alphonsa Overall, MD;  Location: [facility] ORS;  Service: General;  Laterality: N/A; . s/p left broken arm with pin    1980's HPI: Pt is a  71 year old man with ischemic cardiomyopathy, DM, HTN, PAD, OSA who was admitted 8/9 with gastric ulcer perforation and peritonitis. He underwent emergent laparotomy with omental patch repair, postoperatively remained in shock requiring epinephrine and Levophed drip. Course c/b acute ischemia of R arm requiring tx from [facility] to [facility] and urgent OR embolectomy, also c/b slow vent wean. He was intubated 8/10-8/23. SLP ordered for swallowing evaluation post-extubation. Subjective: alert, cooperative Assessment / Plan / Recommendation CHL IP CLINICAL IMPRESSIONS 12/13/2016 Clinical Impression Patient presents with severe risk for aspiration in the context of physical deconditioning after prolonged intubation. MBS limited due to pt's severe aspiration risk. Secretions appear to be thick, coating  pt's posterior pharynx and laryngeal vestibule, visible via movement as pt breathing, coughing. SLP performed oral care and suctioning. With pureed solid, pt with prolonged oral phase, weak manipulation and delayed oral transit, initiating a weak pharyngeal swallow with moderately reduced tongue base retraction, minimal hyolaryngeal excursion, absent epiglottic deflection and reduced pharyngeal constriction. This led to retention of 100% of the bolus in vallecue. SLP cued pt for dry swallows, chin tuck, in an effort to facilitate bolus clearance, which were ineffective. As contrast pooled with pt's secretions, material spilled over pt's epiglottis and into the laryngeal vestibule. Pt intermittently sensed penetration/aspiration, though reflexive and cued cough not effective for airway clearance. Recommend pt remain NPO, with ice chips after oral care to facilitate use of swallowing musculature. Patient may benefit from RMT to strengthen cough, swallowing musculature. Will follow up for therapeutic exercise.  SLP Visit Diagnosis Dysphagia, oropharyngeal phase (R13.12) Attention and concentration deficit following -- Frontal lobe and executive function deficit following -- Impact on safety and function Severe aspiration risk   CHL IP TREATMENT RECOMMENDATION 12/13/2016 Treatment Recommendations F/U MBS in --- days (Comment)   Prognosis 12/13/2016 Prognosis for Safe Diet Advancement Good Barriers to Reach Goals Severity of deficits Barriers/Prognosis Comment -- CHL IP DIET RECOMMENDATION 12/13/2016 SLP Diet Recommendations NPO;Ice chips PRN after oral care;Alternative means - temporary Liquid Administration via -- Medication Administration Via alternative means Compensations -- Postural Changes --   CHL IP OTHER RECOMMENDATIONS 12/13/2016 Recommended Consults -- Oral Care Recommendations Oral care QID;Other (Comment) Other Recommendations Have oral suction available;Remove water pitcher   CHL IP FOLLOW UP RECOMMENDATIONS  12/13/2016 Follow up Recommendations Other (comment)   CHL IP FREQUENCY AND DURATION 12/13/2016 Speech Therapy Frequency (ACUTE ONLY) min 2x/week Treatment Duration 2 weeks      CHL IP ORAL PHASE 12/13/2016 Oral Phase Impaired Oral - Pudding Teaspoon -- Oral - Pudding Cup -- Oral - Honey Teaspoon -- Oral - Honey Cup -- Oral - Nectar Teaspoon -- Oral - Nectar Cup -- Oral - Nectar Straw -- Oral - Thin Teaspoon Lingual pumping;Delayed oral transit;Reduced posterior propulsion;Weak lingual manipulation Oral - Thin Cup -- Oral - Thin Straw -- Oral - Puree Lingual pumping;Delayed oral transit;Piecemeal swallowing;Reduced posterior propulsion;Weak lingual manipulation Oral - Mech Soft -- Oral - Regular -- Oral - Multi-Consistency -- Oral - Pill -- Oral Phase - Comment --  [facility] IP PHARYNGEAL PHASE 12/13/2016 Pharyngeal Phase Impaired Pharyngeal- Pudding Teaspoon -- Pharyngeal -- Pharyngeal- Pudding Cup -- Pharyngeal -- Pharyngeal- Honey Teaspoon -- Pharyngeal -- Pharyngeal- Honey Cup -- Pharyngeal -- Pharyngeal- Nectar Teaspoon -- Pharyngeal -- Pharyngeal- Nectar Cup -- Pharyngeal -- Pharyngeal- Nectar Straw -- Pharyngeal -- Pharyngeal- Thin Teaspoon Delayed swallow initiation-vallecula;Reduced pharyngeal peristalsis;Reduced epiglottic inversion;Reduced anterior laryngeal mobility;Reduced laryngeal elevation;Reduced airway/laryngeal closure;Reduced tongue base retraction;Penetration/Aspiration before swallow;Penetration/Apiration after swallow;Trace aspiration;Pharyngeal residue - valleculae Pharyngeal Material  enters airway, passes BELOW cords and not ejected out despite cough attempt by patient Pharyngeal- Thin Cup -- Pharyngeal -- Pharyngeal- Thin Straw -- Pharyngeal -- Pharyngeal- Puree Delayed swallow initiation-vallecula;Reduced pharyngeal peristalsis;Reduced epiglottic inversion;Reduced anterior laryngeal mobility;Reduced laryngeal elevation;Reduced airway/laryngeal closure;Reduced tongue base  retraction;Penetration/Apiration after swallow;Trace aspiration;Pharyngeal residue - valleculae;Pharyngeal residue - pyriform Pharyngeal Material enters airway, passes BELOW cords and not ejected out despite cough attempt by patient;Material enters airway, passes BELOW cords without attempt by patient to eject out (silent aspiration) Pharyngeal- Mechanical Soft -- Pharyngeal -- Pharyngeal- Regular -- Pharyngeal -- Pharyngeal- Multi-consistency -- Pharyngeal -- Pharyngeal- Pill -- Pharyngeal -- Pharyngeal Comment --  [facility] IP CERVICAL ESOPHAGEAL PHASE 12/13/2016 Cervical Esophageal Phase Impaired Pudding Teaspoon -- Pudding Cup -- Honey Teaspoon -- Honey Cup -- Nectar Teaspoon -- Nectar Cup -- Nectar Straw -- Thin Teaspoon -- Thin Cup -- Thin Straw -- Puree Reduced cricopharyngeal relaxation Mechanical Soft -- Regular -- Multi-consistency -- Pill -- Cervical Esophageal Comment -- Deneise Lever, MS, CCC-SLP Speech-Language Pathologist [phone] No flowsheet data found. Aliene Altes 12/13/2016, 4:32 PM                 Scheduled Meds: . amoxicillin  1,000 mg Per Tube Q12H  . aspirin  81 mg Per Tube Daily  . chlorhexidine gluconate (MEDLINE KIT)  15 mL Mouth Rinse BID  . Chlorhexidine Gluconate Cloth  6 each Topical Daily  . clarithromycin  500 mg Per Tube Q12H  . feeding supplement (PRO-STAT SUGAR FREE 64)  30 mL Per Tube BID  . free water  200 mL Per Tube Q8H  . insulin aspart  0-20 Units Subcutaneous Q4H  . mouth rinse  15 mL Mouth Rinse QID  . metoprolol tartrate  25 mg Per Tube BID  . pantoprazole sodium  40 mg Per Tube BID  . rivaroxaban  20 mg Per Tube Daily  . sodium chloride flush  10-40 mL Intracatheter Q12H   Continuous Infusions: . sodium chloride Stopped (12/14/16 0953)  . sodium chloride 500 mL (12/03/16 0126)  . feeding supplement (VITAL 1.5 CAL) 1,000 mL (12/15/16 0454)     LOS: 18 days    Time spent in minutes: 35    Debbe Odea, MD [facility] Pager: [email or web address] Password [email or web address] 12/15/2016, 8:41 AM

## 2016-12-15 NOTE — Progress Notes (Signed)
Physical Therapy Treatment Patient Details Name: Jacob Pittman MRN: [id number] DOB: May 24, 1945 Today's Date: 12/15/2016    History of Present Illness Patient is a 71 yo male admitted 11/26/16 with perforated duodenal bulb ulcer, now s/p exploratory lap and repair on 11/27/16. Postoperatively remained in shock requiring epinephrine and Levophed drip. Also complicated by ischemic RUE and is s/p embolectomy.  Patient was slow to wean from vent, extubated on 12/10/16.  Patient with confusion.     PMH:   ICM, DM, HTN, PAD, OSA, CHF, EF 15%, ICD, PAF, OA    PT Comments    Pt with slow progress towards his goals. Pt currently maxAx2 for bed mobility and lift transfer to chair. Pt initially mod A for maintaining seated balance worked to min guard assist for sitting EoB for 20 seconds. Pt transferred to chair via Maximove to increase OOB endurance. Pt requires skilled PT to progress balance and mobility transfer to be able to aid in mobility in his discharge environment.     Follow Up Recommendations  SNF;Supervision/Assistance - 24 hour     Equipment Recommendations  Other (comment) (TBD)       Precautions / Restrictions Precautions Precautions: Fall Precaution Comments: NG suction; feeding tube Restrictions Weight Bearing Restrictions: No    Mobility  Bed Mobility Overal bed mobility: Needs Assistance Bed Mobility: Supine to Sit;Sit to Supine     Supine to sit: Max assist;+2 for physical assistance     General bed mobility comments: Patient required repeated verbal and tactile cues for mobility. Able to initiate movement with his LE with increased time and +2 max assist to move trunk into sitting. Seated EoB pt required modA for maintaining seated balance. Pt with increased R lateral lean. With verbal and tactile cuing he was able to achieve more midline placement and maintain seated balance with min guard for 20 sec.   Transfers Overall transfer level: Needs assistance               General transfer comment: Unable        Balance Overall balance assessment: Needs assistance Sitting-balance support: Bilateral upper extremity supported;Feet supported Sitting balance-Leahy Scale: Poor Sitting balance - Comments: Required UE support and external assist to maintain sitting balance. Postural control: Right lateral lean                                  Cognition Arousal/Alertness: Lethargic Behavior During Therapy: Flat affect Overall Cognitive Status: No family/caregiver present to determine baseline cognitive functioning                                 General Comments: Minimal verbalizations      Exercises General Exercises - Lower Extremity Hip Flexion/Marching: AAROM;Both;5 reps;Seated    General Comments General comments (skin integrity, edema, etc.): VSS, Pt daughter present throughout session      Pertinent Vitals/Pain Pain Assessment: Faces Faces Pain Scale: Hurts even more Pain Location: abdomen with movement Pain Descriptors / Indicators: Grimacing;Guarding Pain Intervention(s): Monitored during session    Home Living Family/patient expects to be discharged to:: Private residence Living Arrangements: Alone Available Help at Discharge: Family (Per chart, daughter will be with patient - ? hours)           Additional Comments: Patient unable to provide information.    Prior Function Level of Independence: Independent  Comments: Unsure of reliability of information   PT Goals (current goals can now be found in the care plan section) Acute Rehab PT Goals Patient Stated Goal: Unable to state PT Goal Formulation: Patient unable to participate in goal setting Time For Goal Achievement: 12/26/16 Potential to Achieve Goals: Fair Progress towards PT goals: Progressing toward goals    Frequency    Min 3X/week      PT Plan Current plan remains appropriate       AM-PAC PT "6 Clicks" Daily  Activity  Outcome Measure  Difficulty turning over in bed (including adjusting bedclothes, sheets and blankets)?: Unable Difficulty moving from lying on back to sitting on the side of the bed? : Unable Difficulty sitting down on and standing up from a chair with arms (e.g., wheelchair, bedside commode, etc,.)?: Unable Help needed moving to and from a bed to chair (including a wheelchair)?: Total Help needed walking in hospital room?: Total Help needed climbing 3-5 steps with a railing? : Total 6 Click Score: 6    End of Session   Activity Tolerance: Patient limited by lethargy;Patient limited by fatigue Patient left: with chair alarm set;with nursing/sitter in room;with call bell/phone within reach;in chair Nurse Communication: Mobility status (RR increased to 42 during sitting EOB.) PT Visit Diagnosis: Muscle weakness (generalized) (M62.81);Difficulty in walking, not elsewhere classified (R26.2)     Time: 2025-4270 PT Time Calculation (min) (ACUTE ONLY): 45 min  Charges:  $Therapeutic Activity: 38-52 mins                    G Codes:       Etheridge Geil B. Beverely Risen PT, DPT Acute Rehabilitation  [phone] Pager [phone]     Elon Alas Fleet 12/15/2016, 9:52 AM

## 2016-12-15 NOTE — Progress Notes (Signed)
Pt out of bed with PT x 3 hours via maximove.

## 2016-12-15 NOTE — Progress Notes (Signed)
Physical Therapy Treatment Patient Details Name: Jacob Pittman MRN: [id number] DOB: 1945/10/06 Today's Date: 12/15/2016    History of Present Illness Patient is a 71 yo male admitted 11/26/16 with perforated duodenal bulb ulcer, now s/p exploratory lap and repair on 11/27/16. Postoperatively remained in shock requiring epinephrine and Levophed drip. Also complicated by ischemic RUE and is s/p embolectomy.  Patient was slow to wean from vent, extubated on 12/10/16.  Patient with confusion.     PMH:   ICM, DM, HTN, PAD, OSA, CHF, EF 15%, ICD, PAF, OA    PT Comments    Pt exhausted after sitting in chair for 2.5 hrs and required total assistx2 for transfering back to bed. Pt transferred with help of mobility tech and will continue to be followed by mobility team and PT to improve strength and endurance to progress mobility.    Follow Up Recommendations  SNF;Supervision/Assistance - 24 hour     Equipment Recommendations  Other (comment) (TBD)    Recommendations for Other Services       Precautions / Restrictions Precautions Precautions: Fall Precaution Comments: NG suction; feeding tube Restrictions Weight Bearing Restrictions: No    Mobility  Bed Mobility Overal bed mobility: Needs Assistance Bed Mobility: Rolling Rolling: Total assist   Supine to sit: Max assist;+2 for physical assistance     General bed mobility comments: Pt required total assistx2 for rolling for lift pad removal in supine  Transfers Overall transfer level: Needs assistance               General transfer comment: Unable  Ambulation/Gait                     Balance Overall balance assessment: Needs assistance Sitting-balance support: Bilateral upper extremity supported;Feet supported Sitting balance-Leahy Scale: Poor Sitting balance - Comments: Required UE support and external assist to maintain sitting balance. Postural control: Right lateral lean                                  Cognition Arousal/Alertness: Lethargic Behavior During Therapy: Flat affect Overall Cognitive Status: No family/caregiver present to determine baseline cognitive functioning                                 General Comments: Minimal verbalizations      Exercises General Exercises - Lower Extremity Hip Flexion/Marching: AAROM;Both;5 reps;Seated    General Comments General comments (skin integrity, edema, etc.): VSS, Pt exhausted after sitting in chair for 2.5 hrs and unable to assist in moving back to bed      Pertinent Vitals/Pain Pain Assessment: Faces Faces Pain Scale: Hurts even more Pain Location: abdomen with movement Pain Descriptors / Indicators: Grimacing;Guarding Pain Intervention(s): Monitored during session;Limited activity within patient's tolerance    Home Living Family/patient expects to be discharged to:: Private residence Living Arrangements: Alone Available Help at Discharge: Family (Per chart, daughter will be with patient - ? hours)           Additional Comments: Patient unable to provide information.    Prior Function Level of Independence: Independent      Comments: Unsure of reliability of information   PT Goals (current goals can now be found in the care plan section) Acute Rehab PT Goals Patient Stated Goal: Unable to state PT Goal Formulation: Patient unable to participate in goal setting Time  For Goal Achievement: 12/26/16 Potential to Achieve Goals: Fair Progress towards PT goals: PT to reassess next treatment    Frequency    Min 3X/week      PT Plan Current plan remains appropriate    Co-evaluation              AM-PAC PT "6 Clicks" Daily Activity  Outcome Measure  Difficulty turning over in bed (including adjusting bedclothes, sheets and blankets)?: Unable Difficulty moving from lying on back to sitting on the side of the bed? : Unable Difficulty sitting down on and standing up from a chair with  arms (e.g., wheelchair, bedside commode, etc,.)?: Unable Help needed moving to and from a bed to chair (including a wheelchair)?: Total Help needed walking in hospital room?: Total Help needed climbing 3-5 steps with a railing? : Total 6 Click Score: 6    End of Session   Activity Tolerance: Patient limited by lethargy;Patient limited by fatigue Patient left: with nursing/sitter in room;with call bell/phone within reach;in bed;with bed alarm set Nurse Communication: Mobility status (RR increased to 42 during sitting EOB.) PT Visit Diagnosis: Muscle weakness (generalized) (M62.81);Difficulty in walking, not elsewhere classified (R26.2)     Time: 1140-1210 PT Time Calculation (min) (ACUTE ONLY): 30 min  Charges:  $Therapeutic Activity: 23-37 mins                    G Codes:       Nayely Dingus B. Beverely Risen PT, DPT Acute Rehabilitation  [phone] Pager [phone]     Elon Alas Fleet 12/15/2016, 12:49 PM

## 2016-12-15 NOTE — Progress Notes (Signed)
Monitor tech reports pt having ongoing NSVT, last episode 12 beats. Dr. Butler Denmark paged and notified. New orders received. Elvera Bicker, RN

## 2016-12-15 NOTE — Progress Notes (Signed)
  Speech Language Pathology Treatment: Dysphagia  Patient Details Name: Jacob Pittman MRN: [id number] DOB: 1946/03/01 Today's Date: 12/15/2016 Time: 3151-7616 SLP Time Calculation (min) (ACUTE ONLY): 14 min  Assessment / Plan / Recommendation Clinical Impression  Pt sleeping, arousable for participation.  Wet, congested phonation; audible secretions in pharynx.  Provided oral care, suctioning, but pt with difficulty mobilizing secretions.  Provided ice chips with mod cues for effortful swallow and cough; difficulty maintaining alertness during session.  Recommend repeating MBS after removal of large bore NG to maximize chances for success; however, prolonged intubation, significant deconditioning, limited reserve are significant barriers.  HPI HPI: Pt is a 71 year old man with ischemic cardiomyopathy, DM, HTN, PAD, OSA who was admitted 8/9 with gastric ulcer perforation and peritonitis. He underwent emergent laparotomy with omental patch repair, postoperatively remained in shock requiring epinephrine and Levophed drip. Course c/b acute ischemia of R arm requiring tx from [facility] to [facility] and urgent OR embolectomy, also c/b slow vent wean. He was intubated 8/10-8/23. SLP ordered for swallowing evaluation post-extubation.      SLP Plan  Continue with current plan of care       Recommendations  Diet recommendations: NPO (ice chips after oral care) Medication Administration: Via alternative means                Oral Care Recommendations: Oral care QID;Oral care prior to ice chip/H20 Follow up Recommendations: Skilled Nursing facility SLP Visit Diagnosis: Dysphagia, oropharyngeal phase (R13.12) Plan: Continue with current plan of care       GO               Jacob Pittman L. Jacob Pittman, [place] CCC/SLP Pager [phone]  Jacob Pittman Jacob Pittman 12/15/2016, 2:16 PM

## 2016-12-16 ENCOUNTER — Inpatient Hospital Stay (HOSPITAL_COMMUNITY): Payer: Medicare PPO

## 2016-12-16 DIAGNOSIS — L89620 Pressure ulcer of left heel, unstageable: Secondary | ICD-10-CM | POA: Clinically undetermined

## 2016-12-16 LAB — GLUCOSE, CAPILLARY
GLUCOSE-CAPILLARY: 114 mg/dL — AB (ref 65–99)
GLUCOSE-CAPILLARY: 151 mg/dL — AB (ref 65–99)
GLUCOSE-CAPILLARY: 182 mg/dL — AB (ref 65–99)
Glucose-Capillary: 130 mg/dL — ABNORMAL HIGH (ref 65–99)
Glucose-Capillary: 137 mg/dL — ABNORMAL HIGH (ref 65–99)
Glucose-Capillary: 172 mg/dL — ABNORMAL HIGH (ref 65–99)
Glucose-Capillary: 187 mg/dL — ABNORMAL HIGH (ref 65–99)

## 2016-12-16 LAB — CBC
HCT: 31.9 % — ABNORMAL LOW (ref 39.0–52.0)
Hemoglobin: 9.3 g/dL — ABNORMAL LOW (ref 13.0–17.0)
MCH: 32.3 pg (ref 26.0–34.0)
MCHC: 29.2 g/dL — AB (ref 30.0–36.0)
MCV: 110.8 fL — AB (ref 78.0–100.0)
Platelets: 295 10*3/uL (ref 150–400)
RBC: 2.88 MIL/uL — AB (ref 4.22–5.81)
RDW: 15.7 % — AB (ref 11.5–15.5)
WBC: 6.5 10*3/uL (ref 4.0–10.5)

## 2016-12-16 LAB — BASIC METABOLIC PANEL
Anion gap: 8 (ref 5–15)
BUN: 34 mg/dL — AB (ref 6–20)
CALCIUM: 8.3 mg/dL — AB (ref 8.9–10.3)
CO2: 25 mmol/L (ref 22–32)
CREATININE: 0.88 mg/dL (ref 0.61–1.24)
Chloride: 123 mmol/L — ABNORMAL HIGH (ref 101–111)
GFR calc non Af Amer: >60 mL/min (ref >60)
Glucose, Bld: 138 mg/dL — ABNORMAL HIGH (ref 65–99)
Potassium: 3.5 mmol/L (ref 3.5–5.1)
Sodium: 156 mmol/L — ABNORMAL HIGH (ref 135–145)

## 2016-12-16 LAB — PREALBUMIN: PREALBUMIN: 10.7 mg/dL — AB (ref 18–38)

## 2016-12-16 MED ORDER — IBUPROFEN 200 MG PO TABS: 400.0000 mg | ORAL_TABLET | Freq: Once | ORAL | Status: DC

## 2016-12-16 MED ORDER — FUROSEMIDE 10 MG/ML IJ SOLN
30.0000 mg | Freq: Once | INTRAMUSCULAR | Status: AC
  Administered 2016-12-16: 30 mg via INTRAVENOUS
  Filled 2016-12-16: qty 4

## 2016-12-16 NOTE — Progress Notes (Signed)
Cortrak Tube Team Note:  Consult received to place a Cortrak feeding tube.   A 10 F Cortrak tube was placed in the left nare and secured with a nasal bridle at 88 cm. Per the Cortrak monitor reading the tube tip is post-pyloric.   No x-ray is required. RN may begin using tube.   If the tube becomes dislodged please keep the tube and contact the Cortrak team at [email or web address] (password [email or web address]) for replacement.  If after hours and replacement cannot be delayed, place a NG tube and confirm placement with an abdominal x-ray.     Glee Lashomb MS, RD, LDN [phone] Pager  [phone] Weekend/On-Call Pager   

## 2016-12-16 NOTE — Progress Notes (Signed)
Pt remained lethargic non verbal, only  follows simple  command.febrile with T-max 100.8 axillary. Tylenol given x 2 , sponged bath given but pt still febrile. MD on call made aware and new orders given. Abdominal incision wound dressing changed . Noted about 80% of yellow slough. Pt may need wound debridement/Wound Care Consult. RN will continue to monitor pt.

## 2016-12-16 NOTE — Progress Notes (Signed)
Nurse found patient sitting in recliner chair with his NG tube hanging on the side of the chair.  A small amount of tube feeding solution was found on the floor.  Patient nodded yes when asked by nurse whether he pulled his NG tube.  Patient cleaned.  Order already written by MD for corgard tube placement.  Elnita Maxwell, RN

## 2016-12-16 NOTE — Discharge Instructions (Addendum)
-  Maintain Eakin pouch, may place to bedside drainage bag if output increase when eating.  -IF output becomes too thick, cut spigot off of the Eakin pouch. Use ostomy clamp to close off bottom of pouch. -If leaking noted, change Eakin pouch: Remove old pouch, clean and dry skin thoroughly. Use pattern to cut new Eakin (1) in the room. Place over wound   Information on my medicine - ELIQUIS (apixaban)  Why was Eliquis prescribed for you? Eliquis was prescribed for you to reduce the risk of a blood clot forming that can cause a stroke if you have a medical condition called atrial fibrillation (a type of irregular heartbeat).  What do You need to know about Eliquis ? Take your Eliquis TWICE DAILY - one tablet in the morning and one tablet in the evening with or without food. If you have difficulty swallowing the tablet whole please discuss with your pharmacist how to take the medication safely.  Take Eliquis exactly as prescribed by your doctor and DO NOT stop taking Eliquis without talking to the doctor who prescribed the medication.  Stopping may increase your risk of developing a stroke.  Refill your prescription before you run out.  After discharge, you should have regular check-up appointments with your healthcare provider that is prescribing your Eliquis.  In the future your dose may need to be changed if your kidney function or weight changes by a significant amount or as you get older.  What do you do if you miss a dose? If you miss a dose, take it as soon as you remember on the same day and resume taking twice daily.  Do not take more than one dose of ELIQUIS at the same time to make up a missed dose.  Important Safety Information A possible side effect of Eliquis is bleeding. You should call your healthcare provider right away if you experience any of the following: ? Bleeding from an injury or your nose that does not stop. ? Unusual colored urine (red or dark brown) or unusual  colored stools (red or black). ? Unusual bruising for unknown reasons. ? A serious fall or if you hit your head (even if there is no bleeding).  Some medicines may interact with Eliquis and might increase your risk of bleeding or clotting while on Eliquis. To help avoid this, consult your healthcare provider or pharmacist prior to using any new prescription or non-prescription medications, including herbals, vitamins, non-steroidal anti-inflammatory drugs (NSAIDs) and supplements.  This website has more information on Eliquis (apixaban): http://www.eliquis.com/eliquis/home

## 2016-12-16 NOTE — Progress Notes (Signed)
16 Days Post-Op    CC: Abdominal pain   Subjective: Still confused, mumbles some nothing really intelligible. Winces some with cleaning wound.  Pulmonary sounds wet. No real change in the wound, it is slowly cleaning up some.    Objective: Vital signs in last 24 hours: Temp:  [98.1 F (36.7 C)-100.8 F (38.2 C)] 100.8 F (38.2 C) (08/29 0355) Pulse Rate:  [74-94] 74 (08/29 0355) Resp:  [22-24] 22 (08/29 0355) BP: (90-127)/(46-80) 107/46 (08/29 0355) SpO2:  [94 %-100 %] 94 % (08/29 0355) Weight:  [84.1 kg (185 lb 5 oz)] 84.1 kg (185 lb 5 oz) (08/29 0500) Last BM Date: 12/13/16 NG 2466/ no output recorded Weight 84.1 KG up 3 kg from yesterday Low grad fever, nothing recorded since 3 AM Na 156 No films   Intake/Output from previous day: 08/28 0701 - 08/29 0700 In: 2506.1 [I.V.:40; NG/GT:2466.1] Out: -  Intake/Output this shift: No intake/output data recorded.  General appearance: alert, slowed mentation and   alert, slowed mentation and not really responding or speaking on his own.  Tries to answer questions but I cannot understand him at all today. Resp: Wet sounding, with ronchi anterior exam.   GI: still somewhat distended.  + BS, tolerating tube feeds, nothing recorded about BM.  Open wound is cleaning up slowly.  Ongoing white/dry necrotic tissue at the base.  Lab Results:   Recent Labs  12/13/16 1338 12/14/16 0435  WBC 9.4 6.7  HGB 10.9* 9.6*  HCT 36.4* 31.3*  PLT 436* 381    BMET  Recent Labs  12/15/16 0511 12/16/16 0454  NA 158* 156*  K 3.7 3.5  CL 122* 123*  CO2 25 25  GLUCOSE 175* 138*  BUN 42* 34*  CREATININE 0.95 0.88  CALCIUM 8.3* 8.3*   PT/INR No results for input(s): LABPROT, INR in the last 72 hours.   Recent Labs Lab 12/10/16 0350  ALBUMIN 1.9*     Lipase     Component Value Date/Time   LIPASE 20 12/01/2016 0304     Medications: . amoxicillin  1,000 mg Per Tube Q12H  . aspirin  81 mg Per Tube Daily  . chlorhexidine  gluconate (MEDLINE KIT)  15 mL Mouth Rinse BID  . Chlorhexidine Gluconate Cloth  6 each Topical Daily  . clarithromycin  500 mg Per Tube Q12H  . feeding supplement (PRO-STAT SUGAR FREE 64)  30 mL Per Tube BID  . free water  200 mL Per Tube Q8H  . insulin aspart  0-20 Units Subcutaneous Q4H  . mouth rinse  15 mL Mouth Rinse QID  . metoprolol tartrate  25 mg Per Tube BID  . pantoprazole sodium  40 mg Per Tube BID  . rivaroxaban  20 mg Per Tube Daily  . sodium chloride flush  10-40 mL Intracatheter Q12H    Assessment/Plan Perforated pyloric ulcer  POD#16S/P EXPLORATORY LAPAROTOMY, GRAHAM PATCH REPAIR8/10/18 Dr. Ovidio Kin H Pylori Positive/C diff negative Shock - resolved Acute encephalopathy- prolong ventilator support Biventricular heart failure EF 15%/AICD PAF Right hand ischemia - right brachial/ulnar/radial artery embolectomies, 11/30/16, Dr. Darrick Penna ? CVA Hypernatremia/hyperchloremia - Free water 200 ml q8h  Na156 - this AM Dysphagia Malnutrition - prealbumin < 5 - 12/01/16 Type II diabetes Acute renal failure - resolving FEN: IV fluids/npo/tube feedings ID:  zosyn 8/9- 12/08/16,  Diflucan 8/9-8/15/18, clarithromycin 8/17 - 8/29  Amoxicillin 8/23-29/18  =>> day 15 of antibiotics  DVT: SCD/heparin =>> Xarelto per TF  Plan:  Now primary issues are  Medical.  From our standpoint, we would continue wet to dry dressings.  No further surgical changes.  We cannot debride the abdominal wound, he has very little fascia and this will need to heal on it's own.      LOS: 19 days    Myana Schlup 12/16/2016 [phone]

## 2016-12-16 NOTE — Progress Notes (Signed)
PROGRESS NOTE    Jacob Pittman   HYW:[id number]  DOB: [birth year]  DOA: 11/26/2016 PCP: Biagio Borg, MD   Brief Narrative:  Jacob Pittman 71 y/o male with EF 15% (ischemic CMP) s/p AICD, HTN, DM, PAD presented on 8/9 with abdominal pain, LA 9.4 and a bowel perforation. Was found to have a perforated pyloric channel ulcer and pneumoperitonitis s/p omental patch repair subsequently on epinephrine and Levophed.  8/12-  TTE LV moderately dilated with EF 15% &diffuse hypokinesis. There is akinesis of the inferolateral and inferior myocardium.  E coli UTI noted 8/13-  right brachial A- line placed and then developed ischemic right arm- vascular consult and transfer from [facility] to [facility]- thrombectomy of brachial artery- heparin Paroxysmal A-fib also noted 8/14- Decreased attenuation involving the right dentate nucleus in the right cerebellum and immediately adjacent cerebellar parenchyma, concerning for recent and potentially acute infarct in this area.  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- anticoagulation recommended to be stopped for 10-14 days - H pyloli + started on triple therapy - Transaminitis thought to be du to Diflucan hepatotoxicity 8/17 - febrile again and vasopressors resumed 8/23- extubated 8/25 A-fib with HR up to 160s, fever 101.6 at 4 AM- care transferred to [facility]  Subjective: Pt is nonverbal this morning but arousable, unable to speak well or manage secretions well, fevers coming back   Assessment & Plan:   Principal Problem:   Perforated duodenal bulb ulcer/ peritonitis/   H pylori ulcer  Septic shock  Vancomycin 8/10 (x1 dose) Diflucan 8/10 - 8/15 Zosyn 8/10 > 8/21  Flagyl 8/15 > 8/22  Biaxin 8/15 >>  Amoxicillin 8/22 >> on 8/25 >>Unasyn>>Zosyn - BID PPI-  - 8/25  spiking fevers up to 103- cultures negative-  changed amoxil to Unasyn and then Zosyn - central line removed and fevers resolved - 8/26- changed Zosyn back to Amoxil  -  8/27 - no recurrence of fever- CT abd/pelvis and CXR ordered by gen surgery as they felt he may have another source but I still feel the source was the central line. 8/28 - temp 99, a bit congested by not in resp distress or hypoxic. As NG got dislodged while tube feeds were running, will need to follow for symptoms of aspiration pneumonia- no change in antibiotics today. - per SLP, still needs to by NPO and will need to cont feeding per NG. ? coretrack for long term use. Discuss with gen surgery and will defer to them. - wound is clean- have notified surgey that we are having him assessed for [facility].    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   Nonischemic cardiomyopathy  AICD - Ef 15 %, RV failure, mod pulm HTN- follow with Dr Lovena Le - weight: 90 on admission >>> 84 >> 81 now - hypernatremia: he was fluid overloaded and was then diuresed so I was holding off on giving him free water to bring his sodium level down- gen surgery increased free water yesterday to 200 Q8 - follow weights and I and O carefully and diurese if needed -total I and O inaccurate as tube feeds were initially not being  recorded - on Metoprolol- BP borderline- hold off on ACE I for now    Acute respiratory failure with hypoxia  - in relation to sepsis- extubated and stable     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 - on baby aspirin  - Heparin>> Xarelto    Arterial thrombosis - right brachial occurring after A-line - s/p thrombectomy- staples intact  Left arm edema - no DVT on ultrasound on 8/23    Fatty infiltration of liver -  Currently weight and  diabetes appear controlled    DM (diabetes mellitus), type 2  - A1c 5.8 on 8/10 - on Metformin at home- sugars well controlled here   DVT prophylaxis: heparin Code Status: Full code Family Communication: daughter [name] and wife by telephone Disposition Plan: LTAC    Consultants:   Neurology  PCCM  Vascular surgery  Gen surgery  Procedures  8/10- ex lap- patch repair of pyloric ucler  8/13- brachial embolectomy 2 D ECHO Left ventricle: The cavity size was moderately dilated. Wall   thickness was normal. Systolic function was severely reduced. The   estimated ejection fraction was 15%. Diffuse hypokinesis. There   is akinesis of the inferolateral and inferior myocardium. The   study is not technically sufficient to allow evaluation of LV   diastolic function. - Aortic valve: There was mild regurgitation. - Mitral valve: There was moderate regurgitation. - Left atrium: The atrium was severely dilated. - Right ventricle: The cavity size was moderately dilated. Systolic   function was moderately reduced. - Right atrium: The atrium was severely dilated. - Tricuspid valve: There was moderate regurgitation. - Pulmonary arteries: Systolic pressure was moderately increased.  Antimicrobials:  Anti-infectives    Start     Dose/Rate Route Frequency Ordered Stop   12/13/16 1000  amoxicillin (AMOXIL) 250 MG/5ML suspension 1,000 mg     1,000 mg Per Tube Every 12 hours 12/13/16 0935 12/16/16 2359   12/12/16 1900  piperacillin-tazobactam (ZOSYN) IVPB 3.375 g  Status:  Discontinued     3.375 g 12.5 mL/hr over 240 Minutes Intravenous Every 8 hours 12/12/16 1759 12/13/16 0911   12/12/16 1400  Ampicillin-Sulbactam (UNASYN) 3 g in sodium chloride 0.9 % 100 mL IVPB  Status:  Discontinued     3 g 200 mL/hr over 30 Minutes Intravenous Every 6 hours 12/12/16 1215 12/12/16 1753   12/10/16 0600  amoxicillin (AMOXIL) 250 MG/5ML suspension 1,000 mg  Status:  Discontinued     1,000 mg Per  Tube Every 12 hours 12/09/16 1701 12/12/16 1206   12/09/16 1130  amoxicillin (AMOXIL) 250 MG/5ML suspension 1,000 mg  Status:  Discontinued     1,000 mg Oral Every 12 hours 12/09/16 1117 12/09/16 1118   12/09/16 1130  amoxicillin (AMOXIL) 250 MG/5ML suspension 1,000 mg  Status:  Discontinued     1,000 mg Per Tube Every 12 hours 12/09/16 1118 12/09/16 1701   12/04/16 2200  clarithromycin (BIAXIN) tablet 500 mg     500 mg Per Tube Every 12 hours 12/04/16 1107 12/16/16 2359   12/04/16 2000  piperacillin-tazobactam (ZOSYN) IVPB 3.375 g     3.375 g 12.5 mL/hr over 240 Minutes Intravenous Every 8 hours 12/04/16 1339 12/08/16 2359   12/04/16 1600  metroNIDAZOLE (FLAGYL) tablet 500 mg  Status:  Discontinued     500 mg Per Tube Every 8 hours 12/04/16 1107 12/09/16 1117   12/02/16 1100  clarithromycin (BIAXIN) 250 MG/5ML suspension 500 mg  Status:  Discontinued  500 mg Per Tube Every 12 hours 12/02/16 1012 12/04/16 1107   12/02/16 1045  metroNIDAZOLE (FLAGYL) 50 mg/ml oral suspension 500 mg  Status:  Discontinued     500 mg Per Tube 3 times daily 12/02/16 1036 12/04/16 1107   12/02/16 1015  metroNIDAZOLE (FLAGYL) 50 mg/ml oral suspension 250 mg  Status:  Discontinued     250 mg Per Tube 4 times daily 12/02/16 1011 12/02/16 1036   11/28/16 0800  fluconazole (DIFLUCAN) IVPB 200 mg  Status:  Discontinued     200 mg 100 mL/hr over 60 Minutes Intravenous Every 24 hours 11/27/16 0624 12/02/16 1018   11/27/16 0630  fluconazole (DIFLUCAN) IVPB 400 mg     400 mg 100 mL/hr over 120 Minutes Intravenous  Once 11/27/16 0622 11/27/16 0841   11/27/16 0600  piperacillin-tazobactam (ZOSYN) IVPB 3.375 g  Status:  Discontinued     3.375 g 12.5 mL/hr over 240 Minutes Intravenous Every 8 hours 11/27/16 0534 12/04/16 1339   11/27/16 0515  piperacillin-tazobactam (ZOSYN) IVPB 3.375 g  Status:  Discontinued     3.375 g 100 mL/hr over 30 Minutes Intravenous  Once 11/27/16 0510 11/27/16 0518   11/27/16 0030   piperacillin-tazobactam (ZOSYN) IVPB 3.375 g     3.375 g 100 mL/hr over 30 Minutes Intravenous  Once 11/27/16 0016 11/27/16 0755   11/27/16 0030  vancomycin (VANCOCIN) IVPB 1000 mg/200 mL premix     1,000 mg 200 mL/hr over 60 Minutes Intravenous  Once 11/27/16 0016 11/27/16 0756     Objective: Vitals:   12/15/16 2209 12/16/16 0030 12/16/16 0355 12/16/16 0500  BP: 103/80 (!) 90/56 (!) 107/46   Pulse: 88 77 74   Resp:  (!) 24 (!) 22   Temp:  (!) 100.7 F (38.2 C) (!) 100.8 F (38.2 C)   TempSrc:  Axillary Axillary   SpO2: 99% 97% 94%   Weight:    84.1 kg (185 lb 5 oz)  Height:        Intake/Output Summary (Last 24 hours) at 12/16/16 0920 Last data filed at 12/16/16 0600  Gross per 24 hour  Intake          2506.08 ml  Output                0 ml  Net          2506.08 ml   Filed Weights   12/14/16 0639 12/15/16 0410 12/16/16 0500  Weight: 81.8 kg (180 lb 6.4 oz) 81.1 kg (178 lb 14.4 oz) 84.1 kg (185 lb 5 oz)    Examination: General exam: NAD.  arousable but drifts in/out of sleep HEENT: PERRLA, oral mucosa moist, no sclera icterus or thrush Respiratory system: Clear to auscultation. Respiratory effort normal,  persistent upper airway congestion Cardiovascular system: S1 & S2 heard,  No murmurs  Gastrointestinal system: Abdomen soft, non-tender, nondistended. Normal bowel sound. No organomegaly- NG tube Central nervous system: Alert and oriented to person. No focal neurological deficits. Extremities: No cyanosis, clubbing or edema Skin: No rashes or ulcers Psychiatry:  flat affect  Data Reviewed: I have personally reviewed following labs and imaging studies  CBC:  Recent Labs Lab 12/10/16 0350 12/11/16 0305 12/12/16 0443 12/13/16 1338 12/14/16 0435 12/16/16 0812  WBC 9.8 10.0 8.7 9.4 6.7 6.5  NEUTROABS 7.7 7.9* 6.8 8.2* 5.4  --   HGB 9.8* 9.9* 10.2* 10.9* 9.6* 9.3*  HCT 31.2* 32.0* 33.8* 36.4* 31.3* 31.9*  MCV 105.8* 107.7* 109.0* 109.6* 109.8* 110.8*  PLT  455* 471* 456* 436* 381 588   Basic Metabolic Panel:  Recent Labs Lab 12/10/16 0350 12/11/16 0305 12/12/16 0443 12/13/16 1338 12/14/16 0435 12/15/16 0511 12/15/16 1453 12/16/16 0454  NA 148* 153* 153* 154*  --  158*  --  156*  K 3.6 3.6 3.3* 4.0  --  3.7  --  3.5  CL 114* 113* 117* 121*  --  122*  --  123*  CO2 _0 --  25  --  25  GLUCOSE 116* 136* 125* 177*  --  175*  --  138*  BUN 43* 47* 44* 51*  --  42*  --  34*  CREATININE 1.04 1.06 0.95 0.99  --  0.95  --  0.88  CALCIUM 8.2* 8.5* 8.5* 8.7*  --  8.3*  --  8.3*  MG 2.1 2.0 2.0 2.1 2.3  --  2.2  --   PHOS 3.0  --  2.9  --   --   --   --   --    GFR: Estimated Creatinine Clearance: 84.5 mL/min (by C-G formula based on SCr of 0.88 mg/dL). Liver Function Tests:  Recent Labs Lab 12/10/16 0350  ALBUMIN 1.9*   No results for input(s): LIPASE, AMYLASE in the last 168 hours. No results for input(s): AMMONIA in the last 168 hours. Coagulation Profile: No results for input(s): INR, PROTIME in the last 168 hours. Cardiac Enzymes: No results for input(s): CKTOTAL, CKMB, CKMBINDEX, TROPONINI in the last 168 hours. BNP (last 3 results) No results for input(s): PROBNP in the last 8760 hours. HbA1C: No results for input(s): HGBA1C in the last 72 hours. CBG:  Recent Labs Lab 12/15/16 1632 12/15/16 2025 12/16/16 0009 12/16/16 0359 12/16/16 0746  GLUCAP 142* 154* 187* 137* 151*   Lipid Profile: No results for input(s): CHOL, HDL, LDLCALC, TRIG, CHOLHDL, LDLDIRECT in the last 72 hours. Thyroid Function Tests: No results for input(s): TSH, T4TOTAL, FREET4, T3FREE, THYROIDAB in the last 72 hours. Anemia Panel: No results for input(s): VITAMINB12, FOLATE, FERRITIN, TIBC, IRON, RETICCTPCT in the last 72 hours. Urine analysis:    Component Value Date/Time   COLORURINE YELLOW 09/10/2016 [place] 09/10/2016 1002   LABSPEC 1.025 09/10/2016 1002   PHURINE 6.5 09/10/2016 1002   GLUCOSEU NEGATIVE  09/10/2016 1002   [place] 09/10/2016 1002   BILIRUBINUR SMALL (A) 09/10/2016 1002   KETONESUR NEGATIVE 09/10/2016 1002   PROTEINUR NEGATIVE 06/10/2010 0954   UROBILINOGEN >=8.0 (A) 09/10/2016 1002   NITRITE NEGATIVE 09/10/2016 [place] 09/10/2016 1002    Recent Results (from the past 240 hour(s))  Culture, Urine     Status: None   Collection Time: 12/10/16 11:58 AM  Result Value Ref Range Status   Specimen Description URINE, RANDOM  Final   Special Requests NONE  Final   Culture NO GROWTH  Final   Report Status 12/11/2016 FINAL  Final  Culture, blood (routine x 2)     Status: None   Collection Time: 12/10/16 12:39 PM  Result Value Ref Range Status   Specimen Description BLOOD RIGHT HAND  Final   Special Requests IN PEDIATRIC BOTTLE Blood Culture adequate volume  Final   Culture NO GROWTH 5 DAYS  Final   Report Status 12/15/2016 FINAL  Final  Culture, blood (routine x 2)     Status: None   Collection Time: 12/10/16 12:39 PM  Result Value Ref Range Status   Specimen Description BLOOD  RIGHT HAND  Final   Special Requests IN PEDIATRIC BOTTLE Blood Culture adequate volume  Final   Culture NO GROWTH 5 DAYS  Final   Report Status 12/15/2016 FINAL  Final    Radiology Studies: Dg Abd Portable 1v  Result Date: 12/14/2016 CLINICAL DATA:  NG tube placement. EXAM: PORTABLE ABDOMEN - 1 VIEW COMPARISON:  CT abdomen pelvis dated December 13, 2016. FINDINGS: NG tube seen with the tip and distal side port in the gastric body. Unchanged mildly dilated loop of air-filled sigmoid colon. Enteric contrast is noted in the right and left colon. IMPRESSION: 1. Enteric tube in good position in the gastric body. 2. Unchanged focal dilatation of the sigmoid colon. Electronically Signed   By: Titus Dubin M.D.   On: 12/14/2016 16:42   Scheduled Meds: . amoxicillin  1,000 mg Per Tube Q12H  . aspirin  81 mg Per Tube Daily  . chlorhexidine gluconate (MEDLINE KIT)  15 mL Mouth  Rinse BID  . Chlorhexidine Gluconate Cloth  6 each Topical Daily  . clarithromycin  500 mg Per Tube Q12H  . feeding supplement (PRO-STAT SUGAR FREE 64)  30 mL Per Tube BID  . free water  200 mL Per Tube Q8H  . insulin aspart  0-20 Units Subcutaneous Q4H  . mouth rinse  15 mL Mouth Rinse QID  . metoprolol tartrate  25 mg Per Tube BID  . pantoprazole sodium  40 mg Per Tube BID  . rivaroxaban  20 mg Per Tube Daily  . sodium chloride flush  10-40 mL Intracatheter Q12H   Continuous Infusions: . sodium chloride Stopped (12/14/16 0953)  . sodium chloride 500 mL (12/03/16 0126)  . feeding supplement (VITAL 1.5 CAL) 1,000 mL (12/16/16 0634)     LOS: 19 days   Time spent in minutes: [place], MD [facility] Pager: [phone] [email or web address] Password [email or web address] 12/16/2016, 9:20 AM

## 2016-12-17 LAB — GLUCOSE, CAPILLARY
GLUCOSE-CAPILLARY: 123 mg/dL — AB (ref 65–99)
GLUCOSE-CAPILLARY: 157 mg/dL — AB (ref 65–99)
GLUCOSE-CAPILLARY: 142 mg/dL — AB (ref 65–99)
Glucose-Capillary: 155 mg/dL — ABNORMAL HIGH (ref 65–99)
Glucose-Capillary: 121 mg/dL — ABNORMAL HIGH (ref 65–99)

## 2016-12-17 LAB — CBC
HCT: 32.5 % — ABNORMAL LOW (ref 39.0–52.0)
HEMOGLOBIN: 9.6 g/dL — AB (ref 13.0–17.0)
MCH: 32.8 pg (ref 26.0–34.0)
MCHC: 29.5 g/dL — AB (ref 30.0–36.0)
MCV: 110.9 fL — ABNORMAL HIGH (ref 78.0–100.0)
Platelets: 266 10*3/uL (ref 150–400)
RBC: 2.93 MIL/uL — ABNORMAL LOW (ref 4.22–5.81)
RDW: 15.5 % (ref 11.5–15.5)
WBC: 5.8 10*3/uL (ref 4.0–10.5)

## 2016-12-17 LAB — COMPREHENSIVE METABOLIC PANEL
ALBUMIN: 1.8 g/dL — AB (ref 3.5–5.0)
ALK PHOS: 60 U/L (ref 38–126)
ALT: 47 U/L (ref 17–63)
ANION GAP: 9 (ref 5–15)
AST: 65 U/L — ABNORMAL HIGH (ref 15–41)
BUN: 33 mg/dL — ABNORMAL HIGH (ref 6–20)
CO2: 24 mmol/L (ref 22–32)
Calcium: 8 mg/dL — ABNORMAL LOW (ref 8.9–10.3)
Chloride: 124 mmol/L — ABNORMAL HIGH (ref 101–111)
Creatinine, Ser: 0.87 mg/dL (ref 0.61–1.24)
GFR calc Af Amer: >60 mL/min (ref >60)
GFR calc non Af Amer: >60 mL/min (ref >60)
GLUCOSE: 155 mg/dL — AB (ref 65–99)
POTASSIUM: 3.7 mmol/L (ref 3.5–5.1)
SODIUM: 157 mmol/L — AB (ref 135–145)
Total Bilirubin: 0.7 mg/dL (ref 0.3–1.2)
Total Protein: 5.9 g/dL — ABNORMAL LOW (ref 6.5–8.1)

## 2016-12-17 LAB — PREALBUMIN: Prealbumin: 10.7 mg/dL — ABNORMAL LOW (ref 18–38)

## 2016-12-17 MED ORDER — FREE WATER
200.0000 mL | Status: DC
  Administered 2016-12-17 – 2016-12-20 (×18): 200 mL

## 2016-12-17 NOTE — Clinical Social Work Note (Signed)
CSW continues to follow for discharge needs. Insurance authorization pending for [employer].  Charlynn Court, CSW [phone]

## 2016-12-17 NOTE — Plan of Care (Signed)
Problem: Activity: Goal: Ability to tolerate increased activity will improve Outcome: Progressing Plan to get Up  to chair with hojer lift during Day hours

## 2016-12-17 NOTE — Progress Notes (Signed)
CM talked to [place] with [facility]; still awaiting on insurance authorization; CM to follow up; [employer] [phone]

## 2016-12-17 NOTE — Progress Notes (Signed)
Patient laying in bed with eyes closed.  Responds to verbal stimuli. Patient denies any discomfort.  Patient had five beast of Vtach on telemetry.  MD in the unit and notified. Elnita Maxwell, RN

## 2016-12-17 NOTE — Progress Notes (Signed)
Pt is sleepy arousable  to voice and touch, mouth movement with incomprehensible. Corpak intact and mouth breathing, mouth Care done with oral suction with old dark blood. No active bleeding. Vitals are low stable, with non-sustained 9 beat SVT. Md Paged to  Make aware.

## 2016-12-17 NOTE — Plan of Care (Signed)
Problem: Skin Integrity: Goal: Risk for impaired skin integrity will decrease Outcome: Progressing Wound/covered and protected with Foam Changed today

## 2016-12-17 NOTE — Progress Notes (Signed)
[facility]/Jacob Pittman Progress Note 17 Days Post-Op  Subjective: Patient in bed, has had a large BM.  Still very weak and barely able to talk.  Objective: Vital signs in last 24 hours: Temp:  [98.4 F (36.9 C)-100.2 F (37.9 C)] 98.6 F (37 C) (08/30 0352) Pulse Rate:  [76-95] 82 (08/30 0352) Resp:  [18-20] 18 (08/30 0352) BP: (94-123)/(48-76) 94/50 (08/30 0352) SpO2:  [98 %-100 %] 100 % (08/30 0352) Weight:  [80.8 kg (178 lb 2 oz)-84.6 kg (186 lb 8.2 oz)] 80.8 kg (178 lb 2 oz) (08/30 0734) Last BM Date: 12/13/16  Intake/Output from previous day: 08/29 0701 - 08/30 0700 In: 1830 [NG/GT:1830] Out: 1500 [Urine:1500] Intake/Output this shift: No intake/output data recorded.  General: No distress  Lungs: Rhonchorous breath sounds bilaterally  Abd: Soft, good bowel sounds.  Extremities: No changes  Neuro: Somnolent.  Lab Results:  @LABLAST2 (wbc:2,hgb:2,hct:2,plt:2) BMET ) Recent Labs  12/16/16 0454 12/17/16 0427  NA 156* 157*  K 3.5 3.7  CL 123* 124*  CO2 25 24  GLUCOSE 138* 155*  BUN 34* 33*  CREATININE 0.88 0.87  CALCIUM 8.3* 8.0*   PT/INR No results for input(s): LABPROT, INR in the last 72 hours. ABG No results for input(s): PHART, HCO3 in the last 72 hours.  Invalid input(s): PCO2, PO2  Studies/Results: Dg Chest Port 1 View  Result Date: 12/16/2016 CLINICAL DATA:  Fever and cough. EXAM: PORTABLE CHEST 1 VIEW COMPARISON:  12/13/2016 . FINDINGS: NG tube noted with its tip coiled and projecting over the stomach. Cardiac pacer with lead tip over the right ventricle. Cardiomegaly with normal pulmonary vascularity. Bibasilar atelectasis. IMPRESSION: 1. NG tube noted with its tip coiled and projecting over the stomach. 2. Cardiac pacer with lead tip over the right ventricle. Cardiomegaly with normal pulmonary vascular. 3.  Basilar atelectasis. Electronically Signed   By: Maisie Fus  Register   On: 12/16/2016 10:45    Anti-infectives: Anti-infectives    Start     Dose/Rate  Route Frequency Ordered Stop   12/13/16 1000  amoxicillin (AMOXIL) 250 MG/5ML suspension 1,000 mg     1,000 mg Per Tube Every 12 hours 12/13/16 0935 12/16/16 1833   12/12/16 1900  piperacillin-tazobactam (ZOSYN) IVPB 3.375 g  Status:  Discontinued     3.375 g 12.5 mL/hr over 240 Minutes Intravenous Every 8 hours 12/12/16 1759 12/13/16 0911   12/12/16 1400  Ampicillin-Sulbactam (UNASYN) 3 g in sodium chloride 0.9 % 100 mL IVPB  Status:  Discontinued     3 g 200 mL/hr over 30 Minutes Intravenous Every 6 hours 12/12/16 1215 12/12/16 1753   12/10/16 0600  amoxicillin (AMOXIL) 250 MG/5ML suspension 1,000 mg  Status:  Discontinued     1,000 mg Per Tube Every 12 hours 12/09/16 1701 12/12/16 1206   12/09/16 1130  amoxicillin (AMOXIL) 250 MG/5ML suspension 1,000 mg  Status:  Discontinued     1,000 mg Oral Every 12 hours 12/09/16 1117 12/09/16 1118   12/09/16 1130  amoxicillin (AMOXIL) 250 MG/5ML suspension 1,000 mg  Status:  Discontinued     1,000 mg Per Tube Every 12 hours 12/09/16 1118 12/09/16 1701   12/04/16 2200  clarithromycin (BIAXIN) tablet 500 mg     500 mg Per Tube Every 12 hours 12/04/16 1107 12/16/16 2158   12/04/16 2000  piperacillin-tazobactam (ZOSYN) IVPB 3.375 g     3.375 g 12.5 mL/hr over 240 Minutes Intravenous Every 8 hours 12/04/16 1339 12/08/16 2359   12/04/16 1600  metroNIDAZOLE (FLAGYL) tablet 500 mg  Status:  Discontinued     500 mg Per Tube Every 8 hours 12/04/16 1107 12/09/16 1117   12/02/16 1100  clarithromycin (BIAXIN) 250 MG/5ML suspension 500 mg  Status:  Discontinued     500 mg Per Tube Every 12 hours 12/02/16 1012 12/04/16 1107   12/02/16 1045  metroNIDAZOLE (FLAGYL) 50 mg/ml oral suspension 500 mg  Status:  Discontinued     500 mg Per Tube 3 times daily 12/02/16 1036 12/04/16 1107   12/02/16 1015  metroNIDAZOLE (FLAGYL) 50 mg/ml oral suspension 250 mg  Status:  Discontinued     250 mg Per Tube 4 times daily 12/02/16 1011 12/02/16 1036   11/28/16 0800  fluconazole  (DIFLUCAN) IVPB 200 mg  Status:  Discontinued     200 mg 100 mL/hr over 60 Minutes Intravenous Every 24 hours 11/27/16 0624 12/02/16 1018   11/27/16 0630  fluconazole (DIFLUCAN) IVPB 400 mg     400 mg 100 mL/hr over 120 Minutes Intravenous  Once 11/27/16 0622 11/27/16 0841   11/27/16 0600  piperacillin-tazobactam (ZOSYN) IVPB 3.375 g  Status:  Discontinued     3.375 g 12.5 mL/hr over 240 Minutes Intravenous Every 8 hours 11/27/16 0534 12/04/16 1339   11/27/16 0515  piperacillin-tazobactam (ZOSYN) IVPB 3.375 g  Status:  Discontinued     3.375 g 100 mL/hr over 30 Minutes Intravenous  Once 11/27/16 0510 11/27/16 0518   11/27/16 0030  piperacillin-tazobactam (ZOSYN) IVPB 3.375 g     3.375 g 100 mL/hr over 30 Minutes Intravenous  Once 11/27/16 0016 11/27/16 0755   11/27/16 0030  vancomycin (VANCOCIN) IVPB 1000 mg/200 mL premix     1,000 mg 200 mL/hr over 60 Minutes Intravenous  Once 11/27/16 0016 11/27/16 0756      Assessment/Plan: s/p Procedure(s): BRACHIAL EMBOLECTOMY WITH VEIN PATCH ANGIOPLASTY Still with moderate hypernatremia.  Will increase free water--has been done.  LOS: 20 days   Marta Lamas. Gae Bon, MD, FACS [phone] [facility] Surgery 12/17/2016

## 2016-12-17 NOTE — Progress Notes (Signed)
  Speech Language Pathology Treatment: Dysphagia  Patient Details Name: Jacob Pittman MRN: [id number] DOB: 05-01-1945 Today's Date: 12/17/2016 Time: 2023-3435 SLP Time Calculation (min) (ACUTE ONLY): 11 min  Assessment / Plan / Recommendation Clinical Impression  Pt lethargic, eyes open, no verbalizations, (spontaneous or requests). Pt now has a Cortrak feeding tube. Pt attempts to participate however decreased alertness/awareness and cognitive deficits impact ability. Oral care given followed by ice chips resulted in oral holding with mild manipulation without pharyngeal swallow observed or palpated despite max verbal/tactile/visual cues. Volitional throat clear is minimal; verbal cues provided to elicit cough were not effective. Unfortunately he is not appropriate yet for repeat MBS at present. Continue intervention.    HPI HPI: Pt is a 71 year old man with ischemic cardiomyopathy, DM, HTN, PAD, OSA who was admitted 8/9 with gastric ulcer perforation and peritonitis. He underwent emergent laparotomy with omental patch repair, postoperatively remained in shock requiring epinephrine and Levophed drip. Course c/b acute ischemia of R arm requiring tx from [facility] to [facility] and urgent OR embolectomy, also c/b slow vent wean. He was intubated 8/10-8/23. SLP ordered for swallowing evaluation post-extubation.      SLP Plan  Continue with current plan of care       Recommendations  Diet recommendations: NPO Medication Administration: Via alternative means                Oral Care Recommendations: Oral care QID;Oral care prior to ice chip/H20 Follow up Recommendations: Skilled Nursing facility SLP Visit Diagnosis: Dysphagia, oropharyngeal phase (R13.12) Plan: Continue with current plan of care       GO                Royce Macadamia 12/17/2016, 2:54 PM  Breck Coons Lonell Face.Ed [employer] [phone]

## 2016-12-17 NOTE — Progress Notes (Signed)
PROGRESS NOTE  Jacob Pittman   MHD:[id number]  DOB: February 20, 1946  DOA: 11/26/2016 PCP: Jacob Borg, MD   Brief Narrative:  Jacob Pittman 71 y/o male with EF 15% (ischemic CMP) s/p AICD, HTN, DM, PAD presented on 8/9 with abdominal pain, LA 9.4 and a bowel perforation. Was found to have a perforated pyloric channel ulcer and pneumoperitonitis s/p omental patch repair subsequently on epinephrine and Levophed.  8/12-  TTE LV moderately dilated with EF 15% &diffuse hypokinesis. There is akinesis of the inferolateral and inferior myocardium.  E coli UTI noted 8/13-  right brachial A- line placed and then developed ischemic right arm- vascular consult and transfer from [facility] to [facility]- thrombectomy of brachial artery- heparin Paroxysmal A-fib also noted 8/14- Decreased attenuation involving the right dentate nucleus in the right cerebellum and immediately adjacent cerebellar parenchyma, concerning for recent and potentially acute infarct in this area.  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   Assessment & Plan:   Principal Problem: Perforated duodenal bulb ulcer/ peritonitis/   H pylori ulcer Septic shock  Vancomycin 8/10 (x1 dose) Diflucan 8/10 - 8/15 Zosyn 8/10 > 8/21  Flagyl 8/15 > 8/22  Biaxin 8/15 >>  Amoxicillin 8/22 >> on 8/25 >>Unasyn>>Zosyn - BID PPI-  - 8/25  spiking fevers up to 103- cultures negative-  changed amoxil to Unasyn and then Zosyn - central line removed and fevers resolved - 8/26- changed Zosyn  back to Amoxil  - 8/27 - no recurrence of fever- CT abd/pelvis and CXR ordered by gen surgery as they felt he may have another source but I still feel the source was the central line. 8/28 - temp 99, a bit congested by not in resp distress or hypoxic. As NG got dislodged while tube feeds were running, will need to follow for symptoms of aspiration pneumonia- no change in antibiotics today. - per SLP, still needs to by NPO and will need to cont feeding per NG. ? coretrack for long term use. Discuss with gen surgery and will defer to them. - wound is clean- have notified surgery that we are having him assessed for [facility].  Still waiting on insurance approval.     Paroxysmal A-fib   - HR climbs to 130s episodically - started Metoprolol through tube with improvement in HR - 8/26- Changed Heparin to Xarelto    B/l basilar atelectasis - ordered IS   Hypernatremia  Persistent, but slightly improved, increase free water boluses, recheck BMP in AM.     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 - on baby aspirin  -  Heparin>> Xarelto    Arterial thrombosis - right brachial occurring after A-line - s/p thrombectomy- staples intact  Left arm edema - no DVT on ultrasound on 8/23    Fatty infiltration of liver -  Currently weight and diabetes appear controlled    DM (diabetes mellitus), type 2  - A1c 5.8 on 8/10 - on Metformin at home- sugars well controlled here   DVT prophylaxis: heparin Code Status: Full code Family Communication: daughter Jacob Pittman and wife by telephone Disposition  Plan: LTAC    Consultants:   Neurology  PCCM  Vascular surgery  Gen surgery  Procedures  8/10- ex lap- patch repair of pyloric ucler  8/13- brachial embolectomy 2 D ECHO Left ventricle: The cavity size was moderately dilated. Wall   thickness was normal. Systolic function was severely reduced. The   estimated ejection fraction was 15%. Diffuse hypokinesis. There   is akinesis of the inferolateral and inferior myocardium. The   study is not technically sufficient to allow evaluation of LV   diastolic function. - Aortic valve: There was mild regurgitation. - Mitral valve: There was moderate regurgitation. - Left atrium: The atrium was severely dilated. - Right ventricle: The cavity size was moderately dilated. Systolic   function was moderately reduced. - Right atrium: The atrium was severely dilated. - Tricuspid valve: There was moderate regurgitation. - Pulmonary arteries: Systolic pressure was moderately increased.  Antimicrobials:  Anti-infectives    Start     Dose/Rate Route Frequency Ordered Stop   12/13/16 1000  amoxicillin (AMOXIL) 250 MG/5ML suspension 1,000 mg     1,000 mg Per Tube Every 12 hours 12/13/16 0935 12/16/16 1833   12/12/16 1900  piperacillin-tazobactam (ZOSYN) IVPB 3.375 g  Status:  Discontinued     3.375 g 12.5 mL/hr over 240 Minutes Intravenous Every 8 hours 12/12/16 1759 12/13/16 0911   12/12/16 1400  Ampicillin-Sulbactam (UNASYN) 3 g in sodium chloride 0.9 % 100 mL IVPB  Status:  Discontinued     3 g 200 mL/hr over 30 Minutes Intravenous Every 6 hours 12/12/16 1215 12/12/16 1753   12/10/16 0600  amoxicillin (AMOXIL) 250 MG/5ML suspension 1,000 mg  Status:  Discontinued     1,000 mg Per Tube Every 12 hours 12/09/16 1701 12/12/16 1206   12/09/16 1130  amoxicillin (AMOXIL) 250 MG/5ML suspension 1,000 mg  Status:  Discontinued     1,000 mg Oral Every 12 hours 12/09/16 1117 12/09/16 1118   12/09/16 1130  amoxicillin (AMOXIL) 250 MG/5ML suspension  1,000 mg  Status:  Discontinued     1,000 mg Per Tube Every 12 hours 12/09/16 1118 12/09/16 1701   12/04/16 2200  clarithromycin (BIAXIN) tablet 500 mg     500 mg Per Tube Every 12 hours 12/04/16 1107 12/16/16 2158   12/04/16 2000  piperacillin-tazobactam (ZOSYN) IVPB 3.375 g     3.375 g 12.5 mL/hr over 240 Minutes Intravenous Every 8 hours 12/04/16 1339 12/08/16 2359   12/04/16 1600  metroNIDAZOLE (FLAGYL) tablet 500 mg  Status:  Discontinued     500 mg Per Tube Every 8 hours 12/04/16 1107 12/09/16 1117   12/02/16 1100  clarithromycin (BIAXIN) 250 MG/5ML suspension 500 mg  Status:  Discontinued     500 mg Per Tube Every 12 hours 12/02/16 1012 12/04/16 1107   12/02/16 1045  metroNIDAZOLE (FLAGYL) 50 mg/ml oral suspension 500 mg  Status:  Discontinued     500 mg Per Tube 3 times daily 12/02/16 1036 12/04/16 1107   12/02/16 1015  metroNIDAZOLE (FLAGYL) 50 mg/ml oral suspension 250 mg  Status:  Discontinued     250 mg Per Tube 4 times daily 12/02/16 1011 12/02/16 1036   11/28/16 0800  fluconazole (DIFLUCAN) IVPB 200 mg  Status:  Discontinued     200 mg 100 mL/hr over 60 Minutes Intravenous Every 24 hours 11/27/16 0624 12/02/16 1018   11/27/16 0630  fluconazole (DIFLUCAN) IVPB 400 mg     400 mg 100 mL/hr over 120 Minutes Intravenous  Once 11/27/16 0622 11/27/16 0841   11/27/16 0600  piperacillin-tazobactam (ZOSYN) IVPB 3.375 g  Status:  Discontinued     3.375 g 12.5 mL/hr over 240 Minutes Intravenous Every 8 hours 11/27/16 0534 12/04/16 1339   11/27/16 0515  piperacillin-tazobactam (ZOSYN) IVPB 3.375 g  Status:  Discontinued     3.375 g 100 mL/hr over 30 Minutes Intravenous  Once 11/27/16 0510 11/27/16 0518   11/27/16 0030  piperacillin-tazobactam (ZOSYN) IVPB 3.375 g     3.375 g 100 mL/hr over 30 Minutes Intravenous  Once 11/27/16 0016 11/27/16 0755   11/27/16 0030  vancomycin (VANCOCIN) IVPB 1000 mg/200 mL premix     1,000 mg 200 mL/hr over 60 Minutes Intravenous  Once 11/27/16 0016  11/27/16 0756     Objective: Vitals:   12/17/16 0259 12/17/16 0352 12/17/16 0734 12/17/16 1046  BP: 102/60 (!) 94/50  108/66  Pulse: 76 82  87  Resp: 20 18    Temp: 100.2 F (37.9 C) 98.6 F (37 C)    TempSrc: Axillary Oral    SpO2: 98% 100%    Weight:  84.6 kg (186 lb 8.2 oz) 80.8 kg (178 lb 2 oz)   Height:        Intake/Output Summary (Last 24 hours) at 12/17/16 1234 Last data filed at 12/17/16 0900  Gross per 24 hour  Intake             1830 ml  Output             1300 ml  Net              530 ml   Filed Weights   12/16/16 0500 12/17/16 0352 12/17/16 0734  Weight: 84.1 kg (185 lb 5 oz) 84.6 kg (186 lb 8.2 oz) 80.8 kg (178 lb 2 oz)    Examination: General exam: NAD.  arousable but drifts in/out of sleep but answer question appropriately when awakened.  HEENT: PERRLA, oral mucosa moist, no sclera icterus or thrush Respiratory system: Clear to auscultation. Respiratory effort normal,  persistent upper airway congestion Cardiovascular system: S1 & S2 heard,  No murmurs  Gastrointestinal system: Abdomen soft, non-tender, nondistended. Normal bowel sound. No organomegaly- NG tube Central nervous system: Alert and oriented to person. No focal neurological deficits. Extremities: No cyanosis, clubbing or edema Skin: No rashes or ulcers Psychiatry:  flat affect  Data Reviewed: I have personally reviewed following labs and imaging studies  CBC:  Recent Labs Lab 12/11/16 0305 12/12/16 0443 12/13/16 1338 12/14/16 0435 12/16/16 0812 12/17/16 0427  WBC 10.0 8.7 9.4 6.7 6.5 5.8  NEUTROABS 7.9* 6.8 8.2* 5.4  --   --   HGB 9.9* 10.2* 10.9* 9.6* 9.3* 9.6*  HCT 32.0* 33.8* 36.4* 31.3* 31.9* 32.5*  MCV 107.7* 109.0* 109.6* 109.8* 110.8* 110.9*  PLT 471* 456* 436* 381 295 196   Basic Metabolic Panel:  Recent Labs Lab 12/11/16 0305 12/12/16 0443 12/13/16 1338 12/14/16 0435 12/15/16 0511 12/15/16 1453 12/16/16 0454 12/17/16 0427  NA 153*  153* 154*  --  158*  --   156* 157*  K 3.6 3.3* 4.0  --  3.7  --  3.5 3.7  CL 113* 117* 121*  --  122*  --  123* 124*  CO2 '31 28 25  '$ --  25  --  25 24  GLUCOSE 136* 125* 177*  --  175*  --  138* 155*  BUN 47* 44* 51*  --  42*  --  34* 33*  CREATININE 1.06 0.95 0.99  --  0.95  --  0.88 0.87  CALCIUM 8.5* 8.5* 8.7*  --  8.3*  --  8.3* 8.0*  MG 2.0 2.0 2.1 2.3  --  2.2  --   --   PHOS  --  2.9  --   --   --   --   --   --    GFR: Estimated Creatinine Clearance: 85.5 mL/min (by C-G formula based on SCr of 0.87 mg/dL). Liver Function Tests:  Recent Labs Lab 12/17/16 0427  AST 65*  ALT 47  ALKPHOS 60  BILITOT 0.7  PROT 5.9*  ALBUMIN 1.8*   No results for input(s): LIPASE, AMYLASE in the last 168 hours. No results for input(s): AMMONIA in the last 168 hours. Coagulation Profile: No results for input(s): INR, PROTIME in the last 168 hours. Cardiac Enzymes: No results for input(s): CKTOTAL, CKMB, CKMBINDEX, TROPONINI in the last 168 hours. BNP (last 3 results) No results for input(s): PROBNP in the last 8760 hours. HbA1C: No results for input(s): HGBA1C in the last 72 hours. CBG:  Recent Labs Lab 12/16/16 1932 12/16/16 2359 12/17/16 0351 12/17/16 0721 12/17/16 1139  GLUCAP 182* 114* 155* 123* 157*   Lipid Profile: No results for input(s): CHOL, HDL, LDLCALC, TRIG, CHOLHDL, LDLDIRECT in the last 72 hours. Thyroid Function Tests: No results for input(s): TSH, T4TOTAL, FREET4, T3FREE, THYROIDAB in the last 72 hours. Anemia Panel: No results for input(s): VITAMINB12, FOLATE, FERRITIN, TIBC, IRON, RETICCTPCT in the last 72 hours. Urine analysis:    Component Value Date/Time   COLORURINE YELLOW 09/10/2016 [place] 09/10/2016 1002   LABSPEC 1.025 09/10/2016 1002   PHURINE 6.5 09/10/2016 1002   GLUCOSEU NEGATIVE 09/10/2016 1002   [place] 09/10/2016 1002   BILIRUBINUR SMALL (A) 09/10/2016 1002   KETONESUR NEGATIVE 09/10/2016 1002   PROTEINUR NEGATIVE 06/10/2010 0954    UROBILINOGEN >=8.0 (A) 09/10/2016 1002   NITRITE NEGATIVE 09/10/2016 [place] 09/10/2016 1002    Recent Results (from the past 240 hour(s))  Culture, Urine     Status: None   Collection Time: 12/10/16 11:58 AM  Result Value Ref Range Status   Specimen Description URINE, RANDOM  Final   Special Requests NONE  Final   Culture NO GROWTH  Final   Report Status 12/11/2016 FINAL  Final  Culture, blood (routine x 2)     Status: None   Collection Time: 12/10/16 12:39 PM  Result Value Ref Range Status   Specimen Description BLOOD RIGHT HAND  Final   Special Requests IN PEDIATRIC BOTTLE Blood Culture adequate volume  Final   Culture NO GROWTH 5 DAYS  Final   Report Status 12/15/2016 FINAL  Final  Culture, blood (routine x 2)     Status: None   Collection Time: 12/10/16 12:39 PM  Result Value Ref Range Status   Specimen Description BLOOD RIGHT HAND  Final   Special Requests IN PEDIATRIC BOTTLE Blood Culture adequate volume  Final   Culture NO GROWTH 5 DAYS  Final   Report Status 12/15/2016 FINAL  Final  Culture, blood (routine x 2)     Status: None (Preliminary result)   Collection Time: 12/16/16  4:54 AM  Result Value Ref Range Status   Specimen Description BLOOD LEFT HAND  Final   Special Requests IN PEDIATRIC BOTTLE Blood Culture adequate volume  Final   Culture NO GROWTH 1 DAY  Final   Report Status PENDING  Incomplete  Culture, blood (routine x 2)     Status: None (Preliminary result)   Collection Time: 12/16/16  4:59 AM  Result Value Ref Range Status   Specimen Description BLOOD LEFT HAND  Final   Special Requests IN PEDIATRIC BOTTLE Blood Culture adequate volume  Final   Culture NO GROWTH 1 DAY  Final   Report Status PENDING  Incomplete    Radiology Studies: Dg Chest Port 1 View  Result Date: 12/16/2016 CLINICAL DATA:  Fever and cough. EXAM: PORTABLE CHEST 1 VIEW COMPARISON:  12/13/2016 . FINDINGS: NG tube noted with its tip coiled and projecting over the  stomach. Cardiac pacer with lead tip over the right ventricle. Cardiomegaly with normal pulmonary vascularity. Bibasilar atelectasis. IMPRESSION: 1. NG tube noted with its tip coiled and projecting over the stomach. 2. Cardiac pacer with lead tip over the right ventricle. Cardiomegaly with normal pulmonary vascular. 3.  Basilar atelectasis. Electronically Signed   By: Struthers   On: 12/16/2016 10:45   Scheduled Meds: . aspirin  81 mg Per Tube Daily  . chlorhexidine gluconate (MEDLINE KIT)  15 mL Mouth Rinse BID  . Chlorhexidine Gluconate Cloth  6 each Topical Daily  . feeding supplement (PRO-STAT SUGAR FREE 64)  30 mL Per Tube BID  . free water  200 mL Per Tube Q4H  . insulin aspart  0-20 Units Subcutaneous Q4H  . mouth rinse  15 mL Mouth Rinse QID  . metoprolol tartrate  25 mg Per Tube BID  . pantoprazole sodium  40 mg Per Tube BID  . rivaroxaban  20 mg Per Tube Daily  . sodium chloride flush  10-40 mL Intracatheter Q12H   Continuous Infusions: . sodium chloride Stopped (12/14/16 0953)  . sodium chloride 500 mL (12/03/16 0126)  . feeding supplement (VITAL 1.5 CAL) 1,000 mL (12/17/16 0302)    LOS: 20 days   Time spent in minutes: [place], MD [facility] Pager: [phone] [email or web address] Password [email or web address] 12/17/2016, 12:34 PM

## 2016-12-17 NOTE — Progress Notes (Signed)
Physical Therapy Treatment Patient Details Name: Jacob Pittman MRN: [id number] DOB: 1945-08-22 Today's Date: 12/17/2016    History of Present Illness Patient is a 71 yo male admitted 11/26/16 with perforated duodenal bulb ulcer, now s/p exploratory lap and repair on 11/27/16. Postoperatively remained in shock requiring epinephrine and Levophed drip. Also complicated by ischemic RUE and is s/p embolectomy.  Patient was slow to wean from vent, extubated on 12/10/16.  Patient with confusion.     PMH:   ICM, DM, HTN, PAD, OSA, CHF, EF 15%, ICD, PAF, OA    PT Comments    This session focused on bed mobility. Pt required max A +2 for all bed mobility. Initially, pt followed commands consistently with increased time however began responding minimally to vc and required increased assist. Continue to progress as tolerated.    Follow Up Recommendations  SNF;Supervision/Assistance - 24 hour     Equipment Recommendations  Other (comment) (TBD)    Recommendations for Other Services       Precautions / Restrictions Precautions Precautions: Fall Precaution Comments: NG suction; feeding tube Restrictions Weight Bearing Restrictions: No    Mobility  Bed Mobility Overal bed mobility: Needs Assistance Bed Mobility: Rolling Rolling: Max assist;+2 for physical assistance         General bed mobility comments: multimodal cues for sequencing and +2 assist to roll L and R; use of rail with hand over hand assist for use of rails; deferred sitting EOB due to pt's drowsiness and decreased ability to follow commands  Transfers                    Ambulation/Gait                 Stairs            Wheelchair Mobility    Modified Rankin (Stroke Patients Only)       Balance                                            Cognition Arousal/Alertness: Awake/alert (drowsy) Behavior During Therapy: Flat affect Overall Cognitive Status: No family/caregiver  present to determine baseline cognitive functioning                                 General Comments: Minimal verbalizations      Exercises General Exercises - Lower Extremity Ankle Circles/Pumps: AROM;AAROM;Both;15 reps Heel Slides: AROM;Both;10 reps;Supine    General Comments        Pertinent Vitals/Pain Pain Assessment: No/denies pain    Home Living                      Prior Function            PT Goals (current goals can now be found in the care plan section) Acute Rehab PT Goals PT Goal Formulation: Patient unable to participate in goal setting Time For Goal Achievement: 12/26/16 Potential to Achieve Goals: Fair Progress towards PT goals: Progressing toward goals    Frequency    Min 3X/week      PT Plan Current plan remains appropriate    Co-evaluation              AM-PAC PT "6 Clicks" Daily Activity  Outcome Measure  Difficulty turning over in bed (  including adjusting bedclothes, sheets and blankets)?: Unable Difficulty moving from lying on back to sitting on the side of the bed? : Unable Difficulty sitting down on and standing up from a chair with arms (e.g., wheelchair, bedside commode, etc,.)?: Unable Help needed moving to and from a bed to chair (including a wheelchair)?: Total Help needed walking in hospital room?: Total Help needed climbing 3-5 steps with a railing? : Total 6 Click Score: 6    End of Session   Activity Tolerance: Patient limited by lethargy Patient left: with call bell/phone within reach;in bed;with bed alarm set Nurse Communication: Mobility status PT Visit Diagnosis: Muscle weakness (generalized) (M62.81);Difficulty in walking, not elsewhere classified (R26.2)     Time: 1610-9604 PT Time Calculation (min) (ACUTE ONLY): 17 min  Charges:  $Therapeutic Activity: 8-22 mins                    G Codes:       Erline Levine, PTA Pager: [phone]     Carolynne Edouard 12/17/2016, 1:14  PM

## 2016-12-18 ENCOUNTER — Telehealth: Payer: Self-pay | Admitting: Cardiology

## 2016-12-18 LAB — BASIC METABOLIC PANEL
Anion gap: 6 (ref 5–15)
BUN: 34 mg/dL — AB (ref 6–20)
CHLORIDE: 122 mmol/L — AB (ref 101–111)
CO2: 24 mmol/L (ref 22–32)
CREATININE: 0.82 mg/dL (ref 0.61–1.24)
Calcium: 7.7 mg/dL — ABNORMAL LOW (ref 8.9–10.3)
GFR calc Af Amer: >60 mL/min (ref >60)
GFR calc non Af Amer: >60 mL/min (ref >60)
Glucose, Bld: 168 mg/dL — ABNORMAL HIGH (ref 65–99)
POTASSIUM: 3.4 mmol/L — AB (ref 3.5–5.1)
Sodium: 152 mmol/L — ABNORMAL HIGH (ref 135–145)

## 2016-12-18 LAB — GLUCOSE, CAPILLARY
GLUCOSE-CAPILLARY: 138 mg/dL — AB (ref 65–99)
GLUCOSE-CAPILLARY: 145 mg/dL — AB (ref 65–99)
Glucose-Capillary: 122 mg/dL — ABNORMAL HIGH (ref 65–99)
Glucose-Capillary: 139 mg/dL — ABNORMAL HIGH (ref 65–99)
Glucose-Capillary: 148 mg/dL — ABNORMAL HIGH (ref 65–99)
Glucose-Capillary: 160 mg/dL — ABNORMAL HIGH (ref 65–99)

## 2016-12-18 LAB — CBC
HEMATOCRIT: 30.7 % — AB (ref 39.0–52.0)
Hemoglobin: 9.3 g/dL — ABNORMAL LOW (ref 13.0–17.0)
MCH: 33.2 pg (ref 26.0–34.0)
MCHC: 30.3 g/dL (ref 30.0–36.0)
MCV: 109.6 fL — AB (ref 78.0–100.0)
Platelets: 246 10*3/uL (ref 150–400)
RBC: 2.8 MIL/uL — AB (ref 4.22–5.81)
RDW: 15.3 % (ref 11.5–15.5)
WBC: 5.5 10*3/uL (ref 4.0–10.5)

## 2016-12-18 LAB — POTASSIUM: Potassium: 3.7 mmol/L (ref 3.5–5.1)

## 2016-12-18 LAB — MAGNESIUM: Magnesium: 2.1 mg/dL (ref 1.7–2.4)

## 2016-12-18 MED ORDER — POTASSIUM CHLORIDE 10 MEQ/100ML IV SOLN
10.0000 meq | INTRAVENOUS | Status: AC
  Administered 2016-12-18 (×2): 10 meq via INTRAVENOUS
  Filled 2016-12-18 (×2): qty 100

## 2016-12-18 NOTE — Progress Notes (Signed)
18 Days Post-Op    CC: Abdominal pain   Subjective: No large change, he is a little more verbal today.  Tracking me in the room.  Coretrack in place.    Objective: Vital signs in last 24 hours: Temp:  [97.8 F (36.6 C)-98.4 F (36.9 C)] 98.4 F (36.9 C) (08/31 0401) Pulse Rate:  [70-88] 76 (08/31 0401) Resp:  [18-22] 22 (08/31 0401) BP: (99-110)/(43-68) 99/46 (08/31 0401) SpO2:  [98 %-100 %] 98 % (08/31 0401) Weight:  [84.5 kg (186 lb 3.2 oz)] 84.5 kg (186 lb 3.2 oz) (08/31 0401) Last BM Date: 12/17/16 200 OV PO nothing recorded Urine 1400 Tm 100.2 8/29 afebrile yesterday, VSS Na down to 152 Cl 122 WBC 5.5    Intake/Output from previous day: 08/30 0701 - 08/31 0700 In: 200 [IV Piggyback:200] Out: 1400 [Urine:1400] Intake/Output this shift: No intake/output data recorded.  General appearance: alert, cooperative and no distress GI: abdomen is pretty distended, he seems to be tolerating the Tube feeds.  Wound is cleaning up.  Nurse notes BM now and yesterday.    Lab Results:   Recent Labs  12/17/16 0427 12/18/16 0207  WBC 5.8 5.5  HGB 9.6* 9.3*  HCT 32.5* 30.7*  PLT 266 246    BMET  Recent Labs  12/17/16 0427 12/18/16 0207  NA 157* 152*  K 3.7 3.4*  CL 124* 122*  CO2 24 24  GLUCOSE 155* 168*  BUN 33* 34*  CREATININE 0.87 0.82  CALCIUM 8.0* 7.7*   PT/INR No results for input(s): LABPROT, INR in the last 72 hours.   Recent Labs Lab 12/17/16 0427  AST 65*  ALT 47  ALKPHOS 60  BILITOT 0.7  PROT 5.9*  ALBUMIN 1.8*     Lipase     Component Value Date/Time   LIPASE 20 12/01/2016 0304     Medications: . aspirin  81 mg Per Tube Daily  . chlorhexidine gluconate (MEDLINE KIT)  15 mL Mouth Rinse BID  . Chlorhexidine Gluconate Cloth  6 each Topical Daily  . feeding supplement (PRO-STAT SUGAR FREE 64)  30 mL Per Tube BID  . free water  200 mL Per Tube Q4H  . insulin aspart  0-20 Units Subcutaneous Q4H  . mouth rinse  15 mL Mouth Rinse QID   . metoprolol tartrate  25 mg Per Tube BID  . pantoprazole sodium  40 mg Per Tube BID  . rivaroxaban  20 mg Per Tube Daily  . sodium chloride flush  10-40 mL Intracatheter Q12H   . sodium chloride Stopped (12/14/16 0953)  . sodium chloride 500 mL (12/03/16 0126)  . feeding supplement (VITAL 1.5 CAL) 1,000 mL (12/17/16 0302)   Anti-infectives    Start     Dose/Rate Route Frequency Ordered Stop   12/13/16 1000  amoxicillin (AMOXIL) 250 MG/5ML suspension 1,000 mg     1,000 mg Per Tube Every 12 hours 12/13/16 0935 12/16/16 1833   12/12/16 1900  piperacillin-tazobactam (ZOSYN) IVPB 3.375 g  Status:  Discontinued     3.375 g 12.5 mL/hr over 240 Minutes Intravenous Every 8 hours 12/12/16 1759 12/13/16 0911   12/12/16 1400  Ampicillin-Sulbactam (UNASYN) 3 g in sodium chloride 0.9 % 100 mL IVPB  Status:  Discontinued     3 g 200 mL/hr over 30 Minutes Intravenous Every 6 hours 12/12/16 1215 12/12/16 1753   12/10/16 0600  amoxicillin (AMOXIL) 250 MG/5ML suspension 1,000 mg  Status:  Discontinued     1,000 mg Per Tube  Every 12 hours 12/09/16 1701 12/12/16 1206   12/09/16 1130  amoxicillin (AMOXIL) 250 MG/5ML suspension 1,000 mg  Status:  Discontinued     1,000 mg Oral Every 12 hours 12/09/16 1117 12/09/16 1118   12/09/16 1130  amoxicillin (AMOXIL) 250 MG/5ML suspension 1,000 mg  Status:  Discontinued     1,000 mg Per Tube Every 12 hours 12/09/16 1118 12/09/16 1701   12/04/16 2200  clarithromycin (BIAXIN) tablet 500 mg     500 mg Per Tube Every 12 hours 12/04/16 1107 12/16/16 2158   12/04/16 2000  piperacillin-tazobactam (ZOSYN) IVPB 3.375 g     3.375 g 12.5 mL/hr over 240 Minutes Intravenous Every 8 hours 12/04/16 1339 12/08/16 2359   12/04/16 1600  metroNIDAZOLE (FLAGYL) tablet 500 mg  Status:  Discontinued     500 mg Per Tube Every 8 hours 12/04/16 1107 12/09/16 1117   12/02/16 1100  clarithromycin (BIAXIN) 250 MG/5ML suspension 500 mg  Status:  Discontinued     500 mg Per Tube Every 12 hours  12/02/16 1012 12/04/16 1107   12/02/16 1045  metroNIDAZOLE (FLAGYL) 50 mg/ml oral suspension 500 mg  Status:  Discontinued     500 mg Per Tube 3 times daily 12/02/16 1036 12/04/16 1107   12/02/16 1015  metroNIDAZOLE (FLAGYL) 50 mg/ml oral suspension 250 mg  Status:  Discontinued     250 mg Per Tube 4 times daily 12/02/16 1011 12/02/16 1036   11/28/16 0800  fluconazole (DIFLUCAN) IVPB 200 mg  Status:  Discontinued     200 mg 100 mL/hr over 60 Minutes Intravenous Every 24 hours 11/27/16 0624 12/02/16 1018   11/27/16 0630  fluconazole (DIFLUCAN) IVPB 400 mg     400 mg 100 mL/hr over 120 Minutes Intravenous  Once 11/27/16 0622 11/27/16 0841   11/27/16 0600  piperacillin-tazobactam (ZOSYN) IVPB 3.375 g  Status:  Discontinued     3.375 g 12.5 mL/hr over 240 Minutes Intravenous Every 8 hours 11/27/16 0534 12/04/16 1339   11/27/16 0515  piperacillin-tazobactam (ZOSYN) IVPB 3.375 g  Status:  Discontinued     3.375 g 100 mL/hr over 30 Minutes Intravenous  Once 11/27/16 0510 11/27/16 0518   11/27/16 0030  piperacillin-tazobactam (ZOSYN) IVPB 3.375 g     3.375 g 100 mL/hr over 30 Minutes Intravenous  Once 11/27/16 0016 11/27/16 0755   11/27/16 0030  vancomycin (VANCOCIN) IVPB 1000 mg/200 mL premix     1,000 mg 200 mL/hr over 60 Minutes Intravenous  Once 11/27/16 0016 11/27/16 0756      Assessment/Plan Perforated pyloric ulcer  POD#16S/P EXPLORATORY LAPAROTOMY, GRAHAM PATCH REPAIR8/10/18 Dr. 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 Oneida Alar CVA Hypernatremia/hyperchloremia - Free water 200 ml q8h  Na156 - this AM Dysphagia Malnutrition - prealbumin < 5 - 12/01/16 Type II diabetes Acute renal failure - resolving FEN: IV fluids/npo/tube feedings ID:  zosyn 8/9- 12/08/16, Diflucan  8/9-8/15/18, clarithromycin 8/17 - 8/29 Amoxicillin 8/23-29/18 =>>completed DVT: SCD/heparin =>>Xarelto per TF  Plan:  Continued dressing changes and wound care.  Medical issues are the predominant issue.  We will see again on Monday.        LOS: 21 days    Jacob Pittman 12/18/2016 [phone]

## 2016-12-18 NOTE — Progress Notes (Signed)
Pt had a moment of V-tach which his HR went into the 200's for a short moment but when we got into the room, pt's HR went back to 70-80's NSR, he has had a lot of PVC's and had some NSVT of 19 beat earlier, which MD was notified about, MD notified for the sustained V-tach, ordered 2 K runs to help with this due to K 3.4, from morning labs, did not have another incident after this one, MD again aware, will continue to monitor, Thanks Lavonda Jumbo RN.

## 2016-12-18 NOTE — Plan of Care (Signed)
Problem: Problem: Skin/Wound Progression Goal: Adequate nutrition will be maintained Outcome: Progressing Pt receiving tube feedings to ensure nutrition.  Pt also receiving protein supplement.

## 2016-12-18 NOTE — Progress Notes (Signed)
Nutrition Follow-up  DOCUMENTATION CODES:   Obesity unspecified  INTERVENTION:   -Continue current TF regimen   NUTRITION DIAGNOSIS:   Inadequate oral intake related to inability to eat as evidenced by NPO status.  Being addressed via TF  GOAL:   Patient will meet greater than or equal to 90% of their needs  Progressing  MONITOR:   Diet advancement, TF tolerance, Labs, Weight trends, Skin  REASON FOR ASSESSMENT:   Consult Enteral/tube feeding initiation and management  ASSESSMENT:   71 y.o.M with CHF (EF 15%), HTN, DM, PAD, and OA. Pt admitted with peritonitis and septic shock secondary to perforated ulcer s/p exp lap oversew pyloric channel ulcer, graham omental patch repair for perforated ulcer. Pt transferred to [place] for brachial embolectomy 8/13.  8/29 NG tube out, Cortrak placed Tolerating Vital 1.5 at rate of 55 ml/hr with Prostat BID via Cortrak tube, free water flush 200 mL q 4 hours  Remains NPO, SLP following  Labs: sodium 152 (improved), potassium 3.4, Creatinie wdl,  Meds: reviewed  Diet Order:  Diet NPO time specified  Skin:  Wound (see comment) (stage II pressure injury sacrum)  Last BM:  8/30  Height:   Ht Readings from Last 1 Encounters:  12/13/16 6\' 1"  (1.854 m)    Weight:   Wt Readings from Last 1 Encounters:  12/18/16 186 lb 3.2 oz (84.5 kg)    Ideal Body Weight:  70 kg  BMI:  Body mass index is 24.57 kg/m.  Estimated Nutritional Needs:   Kcal:  2020-2265 kcals  Protein:  100-115 g   Fluid:  >/= 2 L  EDUCATION NEEDS:   No education needs identified at this time  Romelle Starcher MS, RD, LDN [phone] Pager  [phone] Weekend/On-Call Pager

## 2016-12-18 NOTE — Progress Notes (Signed)
Received call from Corrina with [facility]; patient insurance will not approve transfer to LTAC; Attending Md can do a Peer to Peer to discuss the case with [facility] Director [phone] ext [phone]; information given to Attending MD; Alexis Goodell [phone]

## 2016-12-18 NOTE — Telephone Encounter (Signed)
LMOVM for pt to return call b/c home monitor has not updated in 21 days.

## 2016-12-18 NOTE — Progress Notes (Signed)
[facility] notified that patient has been having Vtach all day. Vital signs in EPIC. Ilean Skill LPN

## 2016-12-18 NOTE — Progress Notes (Signed)
  Speech Language Pathology Treatment: Dysphagia  Patient Details Name: Jacob Pittman MRN: [id number] DOB: 1945-07-11 Today's Date: 12/18/2016 Time: 4098-1191 SLP Time Calculation (min) (ACUTE ONLY): 15 min  Assessment / Plan / Recommendation Clinical Impression  Pt more alert this today/afternoon and responding to commands and verbalizing. Volitional cues for coughs successful (moderately weak). Oral care followed by ice chip and tsp water without coughs however intermittent sensation during MBS with penetration/aspiration. If he can continue alert state, increasing strength he may be ready for repeat MBS soon.    HPI HPI: Pt is a 71 year old man with ischemic cardiomyopathy, DM, HTN, PAD, OSA who was admitted 8/9 with gastric ulcer perforation and peritonitis. He underwent emergent laparotomy with omental patch repair, postoperatively remained in shock requiring epinephrine and Levophed drip. Course c/b acute ischemia of R arm requiring tx from [facility] to [facility] and urgent OR embolectomy, also c/b slow vent wean. He was intubated 8/10-8/23. SLP ordered for swallowing evaluation post-extubation.      SLP Plan  Continue with current plan of care       Recommendations  Diet recommendations: NPO Medication Administration: Via alternative means                Oral Care Recommendations: Oral care QID;Oral care prior to ice chip/H20 Follow up Recommendations: Skilled Nursing facility SLP Visit Diagnosis: Dysphagia, oropharyngeal phase (R13.12) Plan: Continue with current plan of care       GO                Royce Macadamia 12/18/2016, 3:43 PM  Breck Coons Lonell Face.Ed [employer] [phone]

## 2016-12-18 NOTE — Progress Notes (Signed)
PROGRESS NOTE  Jacob Pittman   LFY:[id number]  DOB: [birth year]  DOA: 11/26/2016 PCP: Biagio Borg, MD   Brief Narrative:  [name] 71 y/o male with EF 15% (ischemic CMP) s/p AICD, HTN, DM, PAD presented on 8/9 with abdominal pain, LA 9.4 and a bowel perforation. Was found to have a perforated pyloric channel ulcer and pneumoperitonitis s/p omental patch repair subsequently on epinephrine and Levophed.  8/12-  TTE LV moderately dilated with EF 15% &diffuse hypokinesis. There is akinesis of the inferolateral and inferior myocardium.  E coli UTI noted 8/13-  right brachial A- line placed and then developed ischemic right arm- vascular consult and transfer from [facility] to [facility]- thrombectomy of brachial artery- heparin Paroxysmal A-fib also noted 8/14- Decreased attenuation involving the right dentate nucleus in the right cerebellum and immediately adjacent cerebellar parenchyma, concerning for recent and potentially acute infarct in this area.  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     Assessment & Plan:   Principal Problem: Perforated duodenal bulb ulcer/ peritonitis/   H pylori ulcer Septic shock  Vancomycin 8/10 (x1 dose) Diflucan 8/10 - 8/15 Zosyn 8/10 > 8/21  Flagyl 8/15 > 8/22  Biaxin 8/15 >>  Amoxicillin 8/22 >> on 8/25 >>Unasyn>>Zosyn - BID PPI-  - 8/25  spiking fevers up to 103- cultures negative-  changed amoxil to Unasyn and then Zosyn - central line removed and fevers resolved - 8/26- changed Zosyn back to Amoxil  - 8/27 - no recurrence of fever- CT abd/pelvis and CXR ordered  by gen surgery as they felt he may have another source but I still feel the source was the central line. 8/28 - temp 99, a bit congested by not in resp distress or hypoxic. As NG got dislodged while tube feeds were running, will need to follow for symptoms of aspiration pneumonia- no change in antibiotics today. - per SLP, still needs to by NPO and will need to cont feeding per NG. Cortrak has been placed.   - wound is clean- have notified surgery that we are having him assessed for [facility].  Still waiting on insurance approval.     Paroxysmal A-fib   - HR climbs to 130s episodically - started Metoprolol through tube with improvement in HR - 8/26- Changed Heparin to Xarelto    B/l basilar atelectasis - ordered IS   Hypernatremia  Persistent, but slightly improved with increased free water boluses, recheck BMP in AM.      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 - on baby aspirin  - Heparin>> Xarelto    Arterial thrombosis - right brachial occurring after A-line - s/p thrombectomy- staples intact  Left arm edema - no DVT on ultrasound on 8/23    Fatty infiltration of liver -  Currently weight and diabetes appear controlled    DM (diabetes mellitus), type 2  - A1c 5.8 on 8/10 - on Metformin at home- sugars well controlled here   CBG (last 3)   Recent Labs  12/18/16 0021 12/18/16 0420 12/18/16 0730  GLUCAP 160* 139* 138*   DVT prophylaxis: heparin Code Status: Full code Family Communication: daughter [name] and wife by telephone Disposition Plan: LTAC     Consultants:   Neurology  PCCM  Vascular surgery  Gen surgery  Procedures  8/10- ex lap- patch repair of pyloric ucler  8/13- brachial embolectomy 2 D ECHO Left ventricle: The cavity size was moderately dilated. Wall   thickness was normal. Systolic function was severely reduced. The   estimated ejection fraction was 15%. Diffuse hypokinesis. There   is akinesis of the inferolateral and inferior myocardium. The   study is not technically sufficient to allow evaluation of LV   diastolic function. - Aortic valve: There was mild regurgitation. - Mitral valve: There was moderate regurgitation. - Left atrium: The atrium was severely dilated. - Right ventricle: The cavity size was moderately dilated. Systolic   function was moderately reduced. - Right atrium: The atrium was severely dilated. - Tricuspid valve: There was moderate regurgitation. - Pulmonary arteries: Systolic pressure was moderately increased.  Antimicrobials:  Anti-infectives    Start     Dose/Rate Route Frequency Ordered Stop   12/13/16 1000  amoxicillin (AMOXIL) 250 MG/5ML suspension 1,000 mg     1,000 mg Per Tube Every 12 hours 12/13/16 0935 12/16/16 1833   12/12/16 1900  piperacillin-tazobactam (ZOSYN) IVPB 3.375 g  Status:  Discontinued     3.375 g 12.5 mL/hr over 240 Minutes Intravenous Every 8 hours 12/12/16 1759 12/13/16 0911   12/12/16 1400  Ampicillin-Sulbactam (UNASYN) 3 g in sodium chloride 0.9 % 100 mL IVPB  Status:  Discontinued     3 g 200 mL/hr over 30 Minutes Intravenous Every 6 hours 12/12/16 1215 12/12/16 1753   12/10/16 0600  amoxicillin (AMOXIL) 250 MG/5ML suspension 1,000 mg  Status:  Discontinued     1,000 mg Per Tube Every 12 hours 12/09/16 1701 12/12/16 1206   12/09/16 1130  amoxicillin (AMOXIL) 250 MG/5ML suspension 1,000 mg  Status:  Discontinued     1,000 mg Oral Every 12 hours 12/09/16 1117 12/09/16 1118   12/09/16 1130  amoxicillin (AMOXIL) 250 MG/5ML suspension 1,000 mg   Status:  Discontinued     1,000 mg Per Tube Every 12 hours 12/09/16 1118 12/09/16 1701   12/04/16 2200  clarithromycin (BIAXIN) tablet 500 mg     500 mg Per Tube Every 12 hours 12/04/16 1107 12/16/16 2158   12/04/16 2000  piperacillin-tazobactam (ZOSYN) IVPB 3.375 g     3.375 g 12.5 mL/hr over 240 Minutes Intravenous Every 8 hours 12/04/16 1339 12/08/16 2359   12/04/16 1600  metroNIDAZOLE (FLAGYL) tablet 500 mg  Status:  Discontinued     500 mg Per Tube Every 8 hours 12/04/16 1107 12/09/16 1117   12/02/16 1100  clarithromycin (BIAXIN) 250 MG/5ML suspension 500 mg  Status:  Discontinued     500 mg Per Tube Every 12 hours 12/02/16 1012 12/04/16 1107   12/02/16 1045  metroNIDAZOLE (FLAGYL) 50 mg/ml oral suspension 500 mg  Status:  Discontinued     500 mg Per Tube 3 times daily 12/02/16 1036 12/04/16 1107   12/02/16  1015  metroNIDAZOLE (FLAGYL) 50 mg/ml oral suspension 250 mg  Status:  Discontinued     250 mg Per Tube 4 times daily 12/02/16 1011 12/02/16 1036   11/28/16 0800  fluconazole (DIFLUCAN) IVPB 200 mg  Status:  Discontinued     200 mg 100 mL/hr over 60 Minutes Intravenous Every 24 hours 11/27/16 0624 12/02/16 1018   11/27/16 0630  fluconazole (DIFLUCAN) IVPB 400 mg     400 mg 100 mL/hr over 120 Minutes Intravenous  Once 11/27/16 0622 11/27/16 0841   11/27/16 0600  piperacillin-tazobactam (ZOSYN) IVPB 3.375 g  Status:  Discontinued     3.375 g 12.5 mL/hr over 240 Minutes Intravenous Every 8 hours 11/27/16 0534 12/04/16 1339   11/27/16 0515  piperacillin-tazobactam (ZOSYN) IVPB 3.375 g  Status:  Discontinued     3.375 g 100 mL/hr over 30 Minutes Intravenous  Once 11/27/16 0510 11/27/16 0518   11/27/16 0030  piperacillin-tazobactam (ZOSYN) IVPB 3.375 g     3.375 g 100 mL/hr over 30 Minutes Intravenous  Once 11/27/16 0016 11/27/16 0755   11/27/16 0030  vancomycin (VANCOCIN) IVPB 1000 mg/200 mL premix     1,000 mg 200 mL/hr over 60 Minutes Intravenous  Once 11/27/16 0016 11/27/16 0756      Objective: Vitals:   12/17/16 2200 12/18/16 0300 12/18/16 0401 12/18/16 0954  BP: (!) 105/49 (!) 99/43 (!) 99/46 (!) 102/50  Pulse: 82 70 76 77  Resp:   (!) 22   Temp:   98.4 F (36.9 C)   TempSrc:   Oral   SpO2:   98%   Weight:   84.5 kg (186 lb 3.2 oz)   Height:        Intake/Output Summary (Last 24 hours) at 12/18/16 1142 Last data filed at 12/18/16 0600  Gross per 24 hour  Intake              200 ml  Output              800 ml  Net             -600 ml   Filed Weights   12/17/16 0352 12/17/16 0734 12/18/16 0401  Weight: 84.6 kg (186 lb 8.2 oz) 80.8 kg (178 lb 2 oz) 84.5 kg (186 lb 3.2 oz)    Examination: General exam: NAD.  arousable but drifts in/out of sleep but answer question appropriately when awakened.  HEENT: PERRLA, oral mucosa moist, no sclera icterus or thrush Respiratory system: Clear to auscultation. Respiratory effort normal,  persistent upper airway congestion Cardiovascular system: S1 & S2 heard,  No murmurs  Gastrointestinal system: Abdomen soft, non-tender, nondistended. Normal bowel sound. No organomegaly- NG tube Central nervous system: Alert and oriented to person. No focal neurological deficits. Extremities: No cyanosis, clubbing or edema Skin: No rashes or ulcers Psychiatry:  flat affect  Data Reviewed: I have personally reviewed following labs and imaging studies  CBC:  Recent Labs Lab 12/12/16 0443 12/13/16 1338 12/14/16 0435 12/16/16 0812 12/17/16 0427 12/18/16 0207  WBC 8.7 9.4 6.7 6.5 5.8 5.5  NEUTROABS 6.8 8.2* 5.4  --   --   --   HGB 10.2* 10.9* 9.6* 9.3* 9.6* 9.3*  HCT 33.8* 36.4* 31.3* 31.9* 32.5* 30.7*  MCV 109.0* 109.6* 109.8* 110.8* 110.9* 109.6*  PLT 456* 436* 381 295 266 322   Basic Metabolic Panel:  Recent Labs Lab 12/12/16 0443 12/13/16 1338 12/14/16 0435 12/15/16 0511 12/15/16 1453 12/16/16 0454 12/17/16 0427 12/18/16 0207  NA  153* 154*  --  158*  --  156* 157* 152*  K 3.3* 4.0  --  3.7  --  3.5 3.7  3.4*  CL 117* 121*  --  122*  --  123* 124* 122*  CO2 28 25  --  25  --  _0 GLUCOSE 125* 177*  --  175*  --  138* 155* 168*  BUN 44* 51*  --  42*  --  34* 33* 34*  CREATININE 0.95 0.99  --  0.95  --  0.88 0.87 0.82  CALCIUM 8.5* 8.7*  --  8.3*  --  8.3* 8.0* 7.7*  MG 2.0 2.1 2.3  --  2.2  --   --  2.1  PHOS 2.9  --   --   --   --   --   --   --    GFR: Estimated Creatinine Clearance: 90.7 mL/min (by C-G formula based on SCr of 0.82 mg/dL). Liver Function Tests:  Recent Labs Lab 12/17/16 0427  AST 65*  ALT 47  ALKPHOS 60  BILITOT 0.7  PROT 5.9*  ALBUMIN 1.8*   No results for input(s): LIPASE, AMYLASE in the last 168 hours. No results for input(s): AMMONIA in the last 168 hours. Coagulation Profile: No results for input(s): INR, PROTIME in the last 168 hours. Cardiac Enzymes: No results for input(s): CKTOTAL, CKMB, CKMBINDEX, TROPONINI in the last 168 hours. BNP (last 3 results) No results for input(s): PROBNP in the last 8760 hours. HbA1C: No results for input(s): HGBA1C in the last 72 hours. CBG:  Recent Labs Lab 12/17/16 1618 12/17/16 2024 12/18/16 0021 12/18/16 0420 12/18/16 0730  GLUCAP 121* 142* 160* 139* 138*   Lipid Profile: No results for input(s): CHOL, HDL, LDLCALC, TRIG, CHOLHDL, LDLDIRECT in the last 72 hours. Thyroid Function Tests: No results for input(s): TSH, T4TOTAL, FREET4, T3FREE, THYROIDAB in the last 72 hours. Anemia Panel: No results for input(s): VITAMINB12, FOLATE, FERRITIN, TIBC, IRON, RETICCTPCT in the last 72 hours. Urine analysis:    Component Value Date/Time   COLORURINE YELLOW 09/10/2016 [place] 09/10/2016 1002   LABSPEC 1.025 09/10/2016 1002   PHURINE 6.5 09/10/2016 1002   GLUCOSEU NEGATIVE 09/10/2016 1002   [place] 09/10/2016 1002   BILIRUBINUR SMALL (A) 09/10/2016 1002   KETONESUR NEGATIVE 09/10/2016 1002   PROTEINUR NEGATIVE 06/10/2010 0954   UROBILINOGEN >=8.0 (A) 09/10/2016 1002    NITRITE NEGATIVE 09/10/2016 [place] 09/10/2016 1002    Recent Results (from the past 240 hour(s))  Culture, Urine     Status: None   Collection Time: 12/10/16 11:58 AM  Result Value Ref Range Status   Specimen Description URINE, RANDOM  Final   Special Requests NONE  Final   Culture NO GROWTH  Final   Report Status 12/11/2016 FINAL  Final  Culture, blood (routine x 2)     Status: None   Collection Time: 12/10/16 12:39 PM  Result Value Ref Range Status   Specimen Description BLOOD RIGHT HAND  Final   Special Requests IN PEDIATRIC BOTTLE Blood Culture adequate volume  Final   Culture NO GROWTH 5 DAYS  Final   Report Status 12/15/2016 FINAL  Final  Culture, blood (routine x 2)     Status: None   Collection Time: 12/10/16 12:39 PM  Result Value Ref Range Status   Specimen Description BLOOD RIGHT HAND  Final   Special Requests IN PEDIATRIC BOTTLE Blood Culture adequate volume  Final   Culture NO GROWTH 5 DAYS  Final   Report Status 12/15/2016 FINAL  Final  Culture, blood (routine x 2)     Status: None (Preliminary result)   Collection Time: 12/16/16  4:54 AM  Result Value Ref Range Status   Specimen Description BLOOD LEFT HAND  Final   Special Requests IN PEDIATRIC BOTTLE Blood Culture adequate volume  Final   Culture NO GROWTH 1 DAY  Final   Report Status PENDING  Incomplete  Culture, blood (routine x 2)     Status: None (Preliminary result)   Collection Time: 12/16/16  4:59 AM  Result Value Ref Range Status   Specimen Description BLOOD LEFT HAND  Final   Special Requests IN PEDIATRIC BOTTLE Blood Culture adequate volume  Final   Culture NO GROWTH 1 DAY  Final   Report Status PENDING  Incomplete    Radiology Studies: No results found. Scheduled Meds: . aspirin  81 mg Per Tube Daily  . chlorhexidine gluconate (MEDLINE KIT)  15 mL Mouth Rinse BID  . Chlorhexidine Gluconate Cloth  6 each Topical Daily  . feeding supplement (PRO-STAT SUGAR FREE 64)  30 mL  Per Tube BID  . free water  200 mL Per Tube Q4H  . insulin aspart  0-20 Units Subcutaneous Q4H  . mouth rinse  15 mL Mouth Rinse QID  . metoprolol tartrate  25 mg Per Tube BID  . pantoprazole sodium  40 mg Per Tube BID  . rivaroxaban  20 mg Per Tube Daily  . sodium chloride flush  10-40 mL Intracatheter Q12H   Continuous Infusions: . sodium chloride Stopped (12/14/16 0953)  . sodium chloride 500 mL (12/03/16 0126)  . feeding supplement (VITAL 1.5 CAL) 1,000 mL (12/17/16 0302)    LOS: 21 days   Time spent in minutes:22  Irwin Brakeman, MD [facility] Pager: [phone] [email or web address] Password [facility] 12/18/2016, 11:42 AM

## 2016-12-19 ENCOUNTER — Inpatient Hospital Stay (HOSPITAL_COMMUNITY): Payer: Medicare PPO

## 2016-12-19 DIAGNOSIS — I480 Paroxysmal atrial fibrillation: Secondary | ICD-10-CM

## 2016-12-19 DIAGNOSIS — I5043 Acute on chronic combined systolic (congestive) and diastolic (congestive) heart failure: Secondary | ICD-10-CM

## 2016-12-19 DIAGNOSIS — I472 Ventricular tachycardia: Secondary | ICD-10-CM

## 2016-12-19 LAB — GLUCOSE, CAPILLARY
GLUCOSE-CAPILLARY: 121 mg/dL — AB (ref 65–99)
GLUCOSE-CAPILLARY: 156 mg/dL — AB (ref 65–99)
GLUCOSE-CAPILLARY: 208 mg/dL — AB (ref 65–99)
Glucose-Capillary: 105 mg/dL — ABNORMAL HIGH (ref 65–99)
Glucose-Capillary: 146 mg/dL — ABNORMAL HIGH (ref 65–99)
Glucose-Capillary: 172 mg/dL — ABNORMAL HIGH (ref 65–99)

## 2016-12-19 LAB — BASIC METABOLIC PANEL
Anion gap: 8 (ref 5–15)
BUN: 30 mg/dL — AB (ref 6–20)
CALCIUM: 7.6 mg/dL — AB (ref 8.9–10.3)
CO2: 21 mmol/L — ABNORMAL LOW (ref 22–32)
CREATININE: 0.88 mg/dL (ref 0.61–1.24)
Chloride: 118 mmol/L — ABNORMAL HIGH (ref 101–111)
GFR calc Af Amer: >60 mL/min (ref >60)
Glucose, Bld: 175 mg/dL — ABNORMAL HIGH (ref 65–99)
Potassium: 3.8 mmol/L (ref 3.5–5.1)
SODIUM: 147 mmol/L — AB (ref 135–145)

## 2016-12-19 LAB — CBC
HCT: 31.8 % — ABNORMAL LOW (ref 39.0–52.0)
HEMOGLOBIN: 9.9 g/dL — AB (ref 13.0–17.0)
MCH: 33.1 pg (ref 26.0–34.0)
MCHC: 31.1 g/dL (ref 30.0–36.0)
MCV: 106.4 fL — AB (ref 78.0–100.0)
Platelets: 244 10*3/uL (ref 150–400)
RBC: 2.99 MIL/uL — AB (ref 4.22–5.81)
RDW: 15.3 % (ref 11.5–15.5)
WBC: 7.1 10*3/uL (ref 4.0–10.5)

## 2016-12-19 LAB — MAGNESIUM: MAGNESIUM: 2.5 mg/dL — AB (ref 1.7–2.4)

## 2016-12-19 MED ORDER — METOPROLOL TARTRATE 25 MG/10 ML ORAL SUSPENSION
50.0000 mg | Freq: Two times a day (BID) | ORAL | Status: DC
  Administered 2016-12-19 – 2016-12-20 (×2): 50 mg
  Filled 2016-12-19 (×4): qty 20

## 2016-12-19 MED ORDER — MAGNESIUM SULFATE 2 GM/50ML IV SOLN
2.0000 g | Freq: Once | INTRAVENOUS | Status: AC
  Administered 2016-12-19: 2 g via INTRAVENOUS
  Filled 2016-12-19: qty 50

## 2016-12-19 MED ORDER — POTASSIUM CHLORIDE 10 MEQ/100ML IV SOLN
10.0000 meq | INTRAVENOUS | Status: AC
  Administered 2016-12-19 (×2): 10 meq via INTRAVENOUS
  Filled 2016-12-19 (×2): qty 100

## 2016-12-19 NOTE — Consult Note (Signed)
Cardiology Consultation:   Patient ID: Sayer Masini; [id number]; Jan 08, 1946   Admit date: 11/26/2016 Date of Consult: 12/19/2016  Primary Care Provider: Biagio Borg, MD Primary Cardiologist: Dr Martinique Primary Electrophysiologist:  Dr Lovena Le   Patient Profile:   Orlin Kann is a 71 y.o. male with a hx of NICM who is being seen today for the evaluation of VT at the request of Dr Wynetta Emery.  History of Present Illness:   Mr. 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 He had a Biotronik single chamber ICD implanted in 2014 by Dr Lovena Le. He was admitted 11/26/16 with a perforated gastric ulcer which required surgery 11/27/16. 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 He is significantly debilitated. He failed his swallowing study and is not communicative.   Today he had a run of Torsade that was presumable terminated by his defibrillator and we have been called to consult. The pt is awake but unable to give me any history.  Past Medical History:  Diagnosis Date  . ANXIETY 02/04/2009  . CHF (congestive heart failure) ([place])   . Chronic systolic CHF (congestive heart failure) ([place])   . DIABETES MELLITUS, TYPE II 11/07/2008  . 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 HIP PAIN, RIGHT 11/06/2009  . HYPERLIPIDEMIA 11/07/2008  . HYPERTENSION 10/08/2008  . Nonischemic cardiomyopathy [facility])    s/p ICD implantation 03-2013 by Dr Lovena Le  . OSTEOARTHRITIS, KNEE, RIGHT 10/08/2008  . Peripheral arterial disease [facility])     Past Surgical History:  Procedure Laterality Date  . CARDIAC DEFIBRILLATOR PLACEMENT Left 04/10/13   BTK single chamber ICD implanted by Dr Lovena Le for primary prevention  . EMBOLECTOMY Right 11/30/2016   Procedure: BRACHIAL EMBOLECTOMY WITH VEIN PATCH ANGIOPLASTY;  Surgeon: Elam Dutch, MD;  Location: [place];  Service: Vascular;  Laterality: Right;  . IMPLANTABLE CARDIOVERTER DEFIBRILLATOR IMPLANT N/A 04/10/2013   Procedure: IMPLANTABLE CARDIOVERTER DEFIBRILLATOR IMPLANT;  Surgeon: Evans Lance, MD;  Location[facility] CATH LAB;  Service: Cardiovascular;  Laterality: N/A;  . LAPAROTOMY N/A 11/27/2016   Procedure: EXPLORATORY LAPAROTOMY abdominal exploration oversew pyloric channel ulcer gram patch repair perforated ulcer;  Surgeon: Alphonsa Overall, MD;  Location: [facility] ORS;  Service: General;  Laterality: N/A;  . s/p left broken arm with pin     1980's     Home Medications:  Prior to Admission medications   Medication Sig Start Date End Date Taking? Authorizing Provider  aspirin 81 MG EC tablet Take 81 mg by mouth daily.     Yes [provider]  carvedilol (COREG) 12.5 MG tablet TAKE ONE TABLET BY MOUTH TWICE DAILY 04/27/16  Yes Martinique, Peter M, MD  fluticasone [facility]) 50 MCG/ACT nasal spray Place 2 sprays into both nostrils daily. 09/22/13  Yes Biagio Borg, MD  furosemide (LASIX) 40 MG tablet Take 40 mg by mouth daily.   Yes [provider]  metFORMIN (GLUCOPHAGE-XR) 500 MG 24 hr tablet TAKE TWO TABLETS BY MOUTH ONCE DAILY IN THE MORNING 03/18/16  Yes Biagio Borg, MD  metolazone (ZAROXOLYN) 2.5 MG tablet TAKE ONE TABLET BY MOUTH EVERY OTHER DAY 01/10/16  Yes Martinique, Peter M, MD  rosuvastatin (CRESTOR) 20 MG tablet Take 1 tablet (20 mg total) by mouth daily. 09/10/16  Yes Biagio Borg, MD  albuterol (PROVENTIL HFA;VENTOLIN HFA) 108 (90 BASE) MCG/ACT  inhaler Inhale 2 puffs into the lungs every 6 (six) hours as needed for wheezing or shortness of breath. 07/19/14   Biagio Borg, MD  Blood Glucose Monitoring Suppl (ONE TOUCH ULTRA SYSTEM KIT) w/Device KIT Use as directed daily 09/10/16   Biagio Borg, MD  cetirizine (ZYRTEC) 10 MG tablet Take 1 tablet (10 mg total) by mouth daily. Patient not taking: Reported on 11/26/2016 09/22/13   Biagio Borg, MD  furosemide  (LASIX) 40 MG tablet Take 1 tablet (40 mg total) by mouth daily. Patient not taking: Reported on 11/26/2016 07/08/16 10/15/16  Martinique, Peter M, MD  glucose blood test strip Use as instructed 09/10/16   Biagio Borg, MD  Lancets MISC Use as directed once daily 09/10/16   Biagio Borg, MD  sildenafil (REVATIO) 20 MG tablet Take 1 tablet (20 mg total) by mouth as directed. Take 2-4 tablets by mouth as needed Patient taking differently: Take 40 mg by mouth daily as needed (ed).  10/15/16   Evans Lance, MD  silver sulfADIAZINE (SILVADENE) 1 % cream Apply 1 application topically daily. Patient not taking: Reported on 11/26/2016 10/16/15   Gean Birchwood, DPM  spironolactone (ALDACTONE) 25 MG tablet Take 0.5 tablets (12.5 mg total) by mouth daily. Patient not taking: Reported on 11/26/2016 06/08/16   Martinique, Peter M, MD  Tadalafil 2.5 MG TABS Take 1 tablet (2.5 mg total) by mouth daily as needed. Patient taking differently: Take 2.5 mg by mouth daily as needed (erectile dysfunction).  03/04/15   Brett Canales, PA-C    Inpatient Medications: Scheduled Meds: . aspirin  81 mg Per Tube Daily  . chlorhexidine gluconate (MEDLINE KIT)  15 mL Mouth Rinse BID  . Chlorhexidine Gluconate Cloth  6 each Topical Daily  . feeding supplement (PRO-STAT SUGAR FREE 64)  30 mL Per Tube BID  . free water  200 mL Per Tube Q4H  . insulin aspart  0-20 Units Subcutaneous Q4H  . mouth rinse  15 mL Mouth Rinse QID  . metoprolol tartrate  50 mg Per Tube BID  . pantoprazole sodium  40 mg Per Tube BID  . rivaroxaban  20 mg Per Tube Daily  . sodium chloride flush  10-40 mL Intracatheter Q12H   Continuous Infusions: . sodium chloride Stopped (12/14/16 0953)  . sodium chloride 500 mL (12/03/16 0126)  . feeding supplement (VITAL 1.5 CAL) 1,000 mL (12/19/16 1221)   PRN Meds: sodium chloride, acetaminophen (TYLENOL) oral liquid 160 mg/5 mL, artificial tears, ondansetron **OR** ondansetron (ZOFRAN) IV, sodium chloride  flush  Allergies:   No Known Allergies  Social History:   Social History   Social History  . Marital status: Legally Separated    Spouse name: N/A  . Number of children: 2  . Years of education: N/A   Occupational History  . retired [employer]    Social History Main Topics  . Smoking status: Former Research scientist (life sciences)  . Smokeless tobacco: Never Used  . Alcohol use Yes     Comment: 1 pint daily  . Drug use: No  . Sexual activity: No   Other Topics Concern  . Not on file   Social History Narrative   Married/separated although wife comes to appts. With him   10th grade education - poor reading and writing ability   Incarcerated for 6 months in 2009.    Family History:    Family History  Problem Relation Age of Onset  . Diabetes Mother   .  Hypertension Mother      ROS:  Please see the history of present illness.  ROS otherwise unobtainable secondary to pt status    Physical Exam/Data:   Vitals:   12/18/16 2337 12/19/16 0606 12/19/16 0800 12/19/16 0950  BP: (!) 107/46 (!) 108/54  110/60  Pulse: 82 71  76  Resp:    (!) 22  Temp:  98.2 F (36.8 C)  98 F (36.7 C)  TempSrc:  Oral  Oral  SpO2:  97% 98%   Weight:  187 lb 9.6 oz (85.1 kg)    Height:        Intake/Output Summary (Last 24 hours) at 12/19/16 1237 Last data filed at 12/19/16 0600  Gross per 24 hour  Intake           1815.5 ml  Output              300 ml  Net           1515.5 ml   Filed Weights   12/17/16 0734 12/18/16 0401 12/19/16 0606  Weight: 178 lb 2 oz (80.8 kg) 186 lb 3.2 oz (84.5 kg) 187 lb 9.6 oz (85.1 kg)   Body mass index is 24.75 kg/m.  General:  AA male- NG in place HEENT: normal Lymph: no adenopathy Neck: no JVD Endocrine:  No thryomegaly Vascular: Rt brachial surgical site intact Cardiac:  normal S1, S2; RRR; no murmur  Lungs:  Decreased breath sounds Abd: distended, mid line surgical scar, BS decreased, NG in place Ext: both LE in heel boots Musculoskeletal:  No deformities, BUE  and BLE strength normal and equal Skin: warm and dry  Neuro:  Pt is awake- he mumbles responses to questions Psych:  Normal affect   EKG:  The EKG -pending Telemetry:  Telemetry was personally reviewed and demonstrates:  NSR, frequent PVCs, Torsade de Pointes episode noted at 9:40 am.   Relevant CV Studies: Echo 11/29/16- Study Conclusions  - Left ventricle: The cavity size was moderately dilated. Wall   thickness was normal. Systolic function was severely reduced. The   estimated ejection fraction was 15%. Diffuse hypokinesis. There   is akinesis of the inferolateral and inferior myocardium. The   study is not technically sufficient to allow evaluation of LV   diastolic function. - Aortic valve: There was mild regurgitation. - Mitral valve: There was moderate regurgitation. - Left atrium: The atrium was severely dilated. - Right ventricle: The cavity size was moderately dilated. Systolic   function was moderately reduced. - Right atrium: The atrium was severely dilated. - Tricuspid valve: There was moderate regurgitation. - Pulmonary arteries: Systolic pressure was moderately increased.  Impressions:  - Severe LV dysfunction; moderate LVE; mild AI; moderate MR; severe   biatrial enlargement; moderate RVE with moderately reduced   function; moderate TR; moderate elevation in pulmonary pressure.   Laboratory Data:  Chemistry Recent Labs Lab 12/17/16 0427 12/18/16 0207 12/18/16 2001 12/19/16 0638  NA 157* 152*  --  147*  K 3.7 3.4* 3.7 3.8  CL 124* 122*  --  118*  CO2 24 24  --  21*  GLUCOSE 155* 168*  --  175*  BUN 33* 34*  --  30*  CREATININE 0.87 0.82  --  0.88  CALCIUM 8.0* 7.7*  --  7.6*  GFRNONAA >60 >60  --  >60  GFRAA >60 >60  --  >60  ANIONGAP 9 6  --  8     Recent Labs Lab 12/17/16 0427  PROT 5.9*  ALBUMIN 1.8*  AST 65*  ALT 47  ALKPHOS 60  BILITOT 0.7   Hematology Recent Labs Lab 12/17/16 0427 12/18/16 0207 12/19/16 0638  WBC 5.8 5.5  7.1  RBC 2.93* 2.80* 2.99*  HGB 9.6* 9.3* 9.9*  HCT 32.5* 30.7* 31.8*  MCV 110.9* 109.6* 106.4*  MCH 32.8 33.2 33.1  MCHC 29.5* 30.3 31.1  RDW 15.5 15.3 15.3  PLT 266 246 244   Cardiac EnzymesNo results for input(s): TROPONINI in the last 168 hours. No results for input(s): TROPIPOC in the last 168 hours.  BNPNo results for input(s): BNP, PROBNP in the last 168 hours.  DDimer No results for input(s): DDIMER in the last 168 hours.  Radiology/Studies:  Dg Chest Port 1 View  Result Date: 12/16/2016 CLINICAL DATA:  Fever and cough. EXAM: PORTABLE CHEST 1 VIEW COMPARISON:  12/13/2016 . FINDINGS: NG tube noted with its tip coiled and projecting over the stomach. Cardiac pacer with lead tip over the right ventricle. Cardiomegaly with normal pulmonary vascularity. Bibasilar atelectasis. IMPRESSION: 1. NG tube noted with its tip coiled and projecting over the stomach. 2. Cardiac pacer with lead tip over the right ventricle. Cardiomegaly with normal pulmonary vascular. 3.  Basilar atelectasis. Electronically Signed   By: Olivarez   On: 12/16/2016 10:45    Assessment and Plan:   Torsade de Pointes- It appears his ICD fired and terminated the rhythm. Check 12 lead. K+ was 3.8 this am, Mg++ 2.5. He has been NP:O and has not been on his beta blocker (Coreg 12.5 mg BID prior to admission)  NICM- Secondary to moonshine per records. EF 15% this admission  Perforated gastric ulcer- Required surgery- complicated post op course  PAF- New for him. 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           Check 12 lead. Avoid QT prolonging drugs.             Signed, Kerin Ransom, PA-C  12/19/2016 12:37 PM   I have seen, examined the patient, and reviewed the above assessment and plan.  Pt remains quite ill.  Changes to above are made where necessary.  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  This occurs in the setting of acute medical illness and after being off of his CV meds recently. Given prolonged QT, would avoid QT prolonging drugs.  Agree with restarting metoprolol.  Cardiology to follow Very complicated patient with significant acute medical illness.  A high level of decision making was required for this encounter.  Co Sign: Thompson Grayer, MD 12/19/2016 1:38 PM

## 2016-12-19 NOTE — Progress Notes (Signed)
Pt is alert but had change in health status with increase respiratory rate. Rapid response team were notified as well as MD on call. Tele called that pt had torsades. MD ordered 2g of mag and 2 runs of potassium. Pt tolerated the infusion well. Abdomen still distended but calm after the meds were administered. Tylenol given because of pain of the right arm. We will continue to monitor.

## 2016-12-19 NOTE — Significant Event (Signed)
Rapid Response Event Note  Overview:Called by bedside RN d/t episode of torsades/vtach. Time Called: 0146 Arrival Time: 0148 Event Type: Cardiac  Initial Focused Assessment: Upon assessment, pt HR-70s SR, SBP-112.  Pt neuro status unchanged from previous charted assessments.  Interventions: EKG strip reviewed, appears to be torsades.  SR currently.  2g Magnesium and 2 runs KCL ordered. Plan of Care (if not transferred): Give magnesium and KCL runs.  Monitor pt.  Call RRT if further assistance needed. Event Summary: Name of Physician Notified: Elray Mcgregor, NP at 0200    at    Outcome: Stayed in room and stabalized  Event End Time: 0204  Terrilyn Saver

## 2016-12-19 NOTE — Progress Notes (Signed)
PROGRESS NOTE  Jacob Pittman   CMK:[id number]  DOB: 22-Sep-1945  DOA: 11/26/2016 PCP: Biagio Borg, MD   Brief Narrative:  Jacob Pittman 71 y/o male with EF 15% (ischemic CMP) s/p AICD, HTN, DM, PAD presented on 8/9 with abdominal pain, LA 9.4 and a bowel perforation. Was found to have a perforated pyloric channel ulcer and pneumoperitonitis s/p omental patch repair subsequently on epinephrine and Levophed.  8/12-  TTE LV moderately dilated with EF 15% &diffuse hypokinesis. There is akinesis of the inferolateral and inferior myocardium.  E coli UTI noted 8/13-  right brachial A- line placed and then developed ischemic right arm- vascular consult and transfer from [facility] to [facility]- thrombectomy of brachial artery- heparin Paroxysmal A-fib also noted 8/14- Decreased attenuation involving the right dentate nucleus in the right cerebellum and immediately adjacent cerebellar parenchyma, concerning for recent and potentially acute infarct in this area.  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- anticoagulation recommended to be stopped for 10-14 days - H pyloli + started on triple therapy - Transaminitis thought to be du to Diflucan hepatotoxicity 8/17 - febrile again and vasopressors resumed 8/23- extubated 8/25 A-fib with HR up to 160s, fever 101.6 at 4 AM- care transferred to [facility]  Subjective: Pt having frequent Vtach and rhythm irregularities, being supplemented with Mg and K    Assessment & Plan:   Principal Problem: Perforated duodenal bulb ulcer/ peritonitis/   H pylori ulcer Septic shock  Vancomycin 8/10 (x1 dose) Diflucan 8/10 - 8/15 Zosyn 8/10 > 8/21  Flagyl 8/15 > 8/22  Biaxin 8/15 >>  Amoxicillin 8/22 >> on 8/25 >>Unasyn>>Zosyn - BID PPI-  - 8/25  spiking fevers up to 103- cultures negative-  changed amoxil to Unasyn and then Zosyn - central line removed and fevers resolved - 8/26- changed Zosyn back to Amoxil  - 8/27 - no recurrence of fever-  CT abd/pelvis and CXR ordered by gen surgery as they felt he may have another source but I still feel the source was the central line. 8/28 - temp 99, a bit congested by not in resp distress or hypoxic. As NG got dislodged while tube feeds were running, will need to follow for symptoms of aspiration pneumonia- no change in antibiotics today. - per SLP, still needs to by NPO and will need to cont feeding per NG. Cortrak has been placed.   - wound is clean- have notified surgery that we are having him assessed for [facility].  Still waiting on insurance approval.     Paroxysmal A-fib   - frequent PVFs, vtach, torsades - increased metoprolol to 50 mg BID 9/1 - 8/26- Changed Heparin to Xarelto   - replacing K, Mg, follow closely  B/l basilar atelectasis - repeat pCXR in AM  Hypernatremia  Persistent, but improving with increased free water boluses, recheck BMP in AM.      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 - on baby aspirin  - Heparin>> Xarelto    Arterial thrombosis - right brachial occurring after A-line - s/p thrombectomy- staples intact  Left arm edema - no DVT on ultrasound on 8/23    Fatty infiltration of liver -  Currently weight and diabetes appear controlled    DM (diabetes mellitus), type 2  - A1c 5.8 on 8/10 - on Metformin at home- sugars well controlled here   CBG (last  3)   Recent Labs  12/19/16 0425 12/19/16 0743 12/19/16 1156  GLUCAP 121* 156* 208*   DVT prophylaxis: heparin Code Status: Full code Family Communication: daughter [name] and wife by telephone Disposition Plan: LTAC    Consultants:   Neurology  PCCM  Vascular surgery  Gen surgery  Procedures  8/10- ex lap- patch repair of pyloric ucler  8/13- brachial embolectomy 2 D ECHO Left ventricle: The cavity size was moderately dilated. Wall   thickness was normal. Systolic function was severely reduced. The   estimated ejection fraction was 15%. Diffuse hypokinesis. There   is akinesis of the inferolateral and inferior myocardium. The   study is not technically sufficient to allow evaluation of LV   diastolic function. - Aortic valve: There was mild regurgitation. - Mitral valve: There was moderate regurgitation. - Left atrium: The atrium was severely dilated. - Right ventricle: The cavity size was moderately dilated. Systolic   function was moderately reduced. - Right atrium: The atrium was severely dilated. - Tricuspid valve: There was moderate regurgitation. - Pulmonary arteries: Systolic pressure was moderately increased.  Antimicrobials:  Anti-infectives    Start     Dose/Rate Route Frequency Ordered Stop   12/13/16 1000  amoxicillin (AMOXIL) 250 MG/5ML suspension 1,000 mg     1,000 mg Per Tube Every 12 hours 12/13/16 0935 12/16/16 1833   12/12/16 1900  piperacillin-tazobactam (ZOSYN) IVPB 3.375 g  Status:  Discontinued     3.375 g 12.5 mL/hr over 240 Minutes Intravenous Every 8 hours 12/12/16 1759 12/13/16 0911   12/12/16 1400  Ampicillin-Sulbactam (UNASYN) 3 g in sodium chloride 0.9 % 100 mL IVPB  Status:  Discontinued     3 g 200 mL/hr over 30 Minutes Intravenous Every 6 hours 12/12/16 1215 12/12/16 1753   12/10/16 0600  amoxicillin (AMOXIL) 250 MG/5ML suspension 1,000 mg  Status:  Discontinued     1,000 mg Per Tube Every 12 hours 12/09/16 1701 12/12/16 1206     12/09/16 1130  amoxicillin (AMOXIL) 250 MG/5ML suspension 1,000 mg  Status:  Discontinued     1,000 mg Oral Every 12 hours 12/09/16 1117 12/09/16 1118   12/09/16 1130  amoxicillin (AMOXIL) 250 MG/5ML suspension 1,000 mg  Status:  Discontinued     1,000 mg Per Tube Every 12 hours 12/09/16 1118 12/09/16 1701   12/04/16 2200  clarithromycin (BIAXIN) tablet 500 mg     500 mg Per Tube Every 12 hours 12/04/16 1107 12/16/16 2158   12/04/16 2000  piperacillin-tazobactam (ZOSYN) IVPB 3.375 g     3.375 g 12.5 mL/hr over 240 Minutes Intravenous Every 8 hours 12/04/16 1339 12/08/16 2359   12/04/16 1600  metroNIDAZOLE (FLAGYL) tablet 500 mg  Status:  Discontinued     500 mg  Per Tube Every 8 hours 12/04/16 1107 12/09/16 1117   12/02/16 1100  clarithromycin (BIAXIN) 250 MG/5ML suspension 500 mg  Status:  Discontinued     500 mg Per Tube Every 12 hours 12/02/16 1012 12/04/16 1107   12/02/16 1045  metroNIDAZOLE (FLAGYL) 50 mg/ml oral suspension 500 mg  Status:  Discontinued     500 mg Per Tube 3 times daily 12/02/16 1036 12/04/16 1107   12/02/16 1015  metroNIDAZOLE (FLAGYL) 50 mg/ml oral suspension 250 mg  Status:  Discontinued     250 mg Per Tube 4 times daily 12/02/16 1011 12/02/16 1036   11/28/16 0800  fluconazole (DIFLUCAN) IVPB 200 mg  Status:  Discontinued     200 mg 100 mL/hr over 60 Minutes Intravenous Every 24 hours 11/27/16 0624 12/02/16 1018   11/27/16 0630  fluconazole (DIFLUCAN) IVPB 400 mg     400 mg 100 mL/hr over 120 Minutes Intravenous  Once 11/27/16 0622 11/27/16 0841   11/27/16 0600  piperacillin-tazobactam (ZOSYN) IVPB 3.375 g  Status:  Discontinued     3.375 g 12.5 mL/hr over 240 Minutes Intravenous Every 8 hours 11/27/16 0534 12/04/16 1339   11/27/16 0515  piperacillin-tazobactam (ZOSYN) IVPB 3.375 g  Status:  Discontinued     3.375 g 100 mL/hr over 30 Minutes Intravenous  Once 11/27/16 0510 11/27/16 0518   11/27/16 0030  piperacillin-tazobactam (ZOSYN) IVPB 3.375 g     3.375  g 100 mL/hr over 30 Minutes Intravenous  Once 11/27/16 0016 11/27/16 0755   11/27/16 0030  vancomycin (VANCOCIN) IVPB 1000 mg/200 mL premix     1,000 mg 200 mL/hr over 60 Minutes Intravenous  Once 11/27/16 0016 11/27/16 0756     Objective: Vitals:   12/18/16 2337 12/19/16 0606 12/19/16 0800 12/19/16 0950  BP: (!) 107/46 (!) 108/54  110/60  Pulse: 82 71  76  Resp:    (!) 22  Temp:  98.2 F (36.8 C)  98 F (36.7 C)  TempSrc:  Oral  Oral  SpO2:  97% 98%   Weight:  85.1 kg (187 lb 9.6 oz)    Height:        Intake/Output Summary (Last 24 hours) at 12/19/16 1223 Last data filed at 12/19/16 0600  Gross per 24 hour  Intake           1815.5 ml  Output              300 ml  Net           1515.5 ml   Filed Weights   12/17/16 0734 12/18/16 0401 12/19/16 0606  Weight: 80.8 kg (178 lb 2 oz) 84.5 kg (186 lb 3.2 oz) 85.1 kg (187 lb 9.6 oz)    Examination: General exam: NAD.  arousable but drifts in/out of sleep but answer question appropriately when awakened.  HEENT: PERRLA, oral mucosa moist, no sclera icterus or thrush Respiratory system: Clear to auscultation. Respiratory effort normal,  persistent upper airway congestion Cardiovascular system: S1 & S2 heard,  No murmurs  Gastrointestinal system: Abdomen soft, non-tender, nondistended. Normal bowel sound. No organomegaly- cortrak in place Central nervous system: Alert and oriented to person. No focal neurological deficits. Extremities: No cyanosis, clubbing or edema Skin: No rashes or ulcers Psychiatry:  flat affect  Data Reviewed: I have personally reviewed following labs and imaging studies  CBC:  Recent Labs Lab 12/13/16 1338 12/14/16 0435 12/16/16 0812 12/17/16 0427 12/18/16 0207 12/19/16 0638  WBC 9.4 6.7 6.5 5.8 5.5 7.1  NEUTROABS 8.2* 5.4  --   --   --   --   HGB 10.9* 9.6* 9.3* 9.6* 9.3* 9.9*  HCT 36.4* 31.3* 31.9* 32.5* 30.7* 31.8*  MCV 109.6* 109.8* 110.8* 110.9* 109.6* 106.4*  PLT 436* 381 295 266 246 712    Basic Metabolic Panel:  Recent Labs Lab 12/13/16 1338 12/14/16 0435 12/15/16 0511 12/15/16 1453 12/16/16 0454 12/17/16 0427 12/18/16 0207 12/18/16 2001 12/19/16 0638  NA 154*  --  158*  --  156* 157* 152*  --  147*  K 4.0  --  3.7  --  3.5 3.7 3.4* 3.7 3.8  CL 121*  --  122*  --  123* 124* 122*  --  118*  CO2 25  --  25  --  _0 --  21*  GLUCOSE 177*  --  175*  --  138* 155* 168*  --  175*  BUN 51*  --  42*  --  34* 33* 34*  --  30*  CREATININE 0.99  --  0.95  --  0.88 0.87 0.82  --  0.88  CALCIUM 8.7*  --  8.3*  --  8.3* 8.0* 7.7*  --  7.6*  MG 2.1 2.3  --  2.2  --   --  2.1  --  2.5*   GFR: Estimated Creatinine Clearance: 84.5 mL/min (by C-G formula based on SCr of 0.88 mg/dL). Liver Function Tests:  Recent Labs Lab 12/17/16 0427  AST 65*  ALT 47  ALKPHOS 60  BILITOT 0.7  PROT 5.9*  ALBUMIN 1.8*   No results for input(s): LIPASE, AMYLASE in the last 168 hours. No results for input(s): AMMONIA in the last 168 hours. Coagulation Profile: No results for input(s): INR, PROTIME in the last 168 hours. Cardiac Enzymes: No results for input(s): CKTOTAL, CKMB, CKMBINDEX, TROPONINI in the last 168 hours. BNP (last 3 results) No results for input(s): PROBNP in the last 8760 hours. HbA1C: No results for input(s): HGBA1C in the last 72 hours. CBG:  Recent Labs Lab 12/18/16 1940 12/19/16 0012 12/19/16 0425 12/19/16 0743 12/19/16 1156  GLUCAP 122* 105* 121* 156* 208*   Lipid Profile: No results for input(s): CHOL, HDL, LDLCALC, TRIG, CHOLHDL, LDLDIRECT in the last 72 hours. Thyroid Function Tests: No results for input(s): TSH, T4TOTAL, FREET4, T3FREE, THYROIDAB in the last 72 hours. Anemia Panel: No results for input(s): VITAMINB12, FOLATE, FERRITIN, TIBC, IRON, RETICCTPCT in the last 72 hours. Urine analysis:    Component Value Date/Time   COLORURINE YELLOW 09/10/2016 [place] 09/10/2016 1002   LABSPEC 1.025 09/10/2016 1002    PHURINE 6.5 09/10/2016 1002   GLUCOSEU NEGATIVE 09/10/2016 1002   [place] 09/10/2016 1002   BILIRUBINUR SMALL (A) 09/10/2016 1002   KETONESUR NEGATIVE 09/10/2016 1002   PROTEINUR NEGATIVE 06/10/2010 0954   UROBILINOGEN >=8.0 (A) 09/10/2016 1002   NITRITE NEGATIVE 09/10/2016 [place] 09/10/2016 1002    Recent Results (from the past 240 hour(s))  Culture, Urine     Status: None   Collection Time: 12/10/16 11:58 AM  Result Value Ref Range Status   Specimen Description URINE, RANDOM  Final   Special Requests NONE  Final   Culture NO GROWTH  Final   Report Status 12/11/2016 FINAL  Final  Culture, blood (routine x 2)     Status: None   Collection Time: 12/10/16 12:39 PM  Result Value Ref Range Status   Specimen Description BLOOD RIGHT HAND  Final   Special Requests IN PEDIATRIC BOTTLE Blood Culture adequate volume  Final   Culture NO GROWTH 5 DAYS  Final   Report Status 12/15/2016 FINAL  Final  Culture, blood (routine x 2)     Status: None   Collection Time: 12/10/16 12:39 PM  Result Value Ref Range Status   Specimen Description BLOOD RIGHT HAND  Final   Special Requests IN PEDIATRIC BOTTLE Blood Culture adequate volume  Final   Culture NO GROWTH 5 DAYS  Final   Report Status 12/15/2016 FINAL  Final  Culture, blood (routine x 2)     Status: None (Preliminary result)   Collection Time: 12/16/16  4:54 AM  Result Value Ref Range Status   Specimen Description BLOOD LEFT HAND  Final   Special Requests IN PEDIATRIC BOTTLE Blood Culture adequate volume  Final   Culture NO GROWTH 3 DAYS  Final   Report Status PENDING  Incomplete  Culture, blood (routine x 2)     Status: None (Preliminary result)   Collection Time: 12/16/16  4:59 AM  Result Value Ref Range Status   Specimen Description BLOOD LEFT HAND  Final   Special Requests IN PEDIATRIC BOTTLE Blood Culture adequate volume  Final   Culture NO GROWTH 3 DAYS  Final   Report Status PENDING  Incomplete     Radiology Studies: No results found. Scheduled Meds: . aspirin  81 mg Per Tube Daily  . chlorhexidine gluconate (MEDLINE KIT)  15 mL Mouth Rinse BID  . Chlorhexidine Gluconate Cloth  6 each Topical Daily  . feeding supplement (PRO-STAT SUGAR FREE 64)  30 mL Per Tube BID  . free water  200 mL Per Tube Q4H  . insulin aspart  0-20 Units Subcutaneous Q4H  . mouth rinse  15 mL Mouth Rinse QID  . metoprolol tartrate  25 mg Per Tube BID  . pantoprazole sodium  40 mg Per Tube BID  . rivaroxaban  20 mg Per Tube Daily  . sodium chloride flush  10-40 mL Intracatheter Q12H   Continuous Infusions: . sodium chloride Stopped (12/14/16 0953)  . sodium chloride 500 mL (12/03/16 0126)  . feeding supplement (VITAL 1.5 CAL) 1,000 mL (12/19/16 1221)    LOS: 22 days   Time spent in minutes:17  Irwin Brakeman, MD [facility] Pager: [phone] [email or web address] Password [facility] 12/19/2016, 12:23 PM

## 2016-12-19 NOTE — Progress Notes (Signed)
Pt is alert but only response you to speech. Alert and oriented times 1.Follows command, and nod his head per being asked his pain level, turning and positioning continue, mouth care continue at regular interval,  NG tube feeding continue and flushed set up in between the feeding, dressing change has been done, suctioning done prn, meet with daughter and gave her update regarding current situation, will continue to monitor.

## 2016-12-20 LAB — COMPREHENSIVE METABOLIC PANEL
ALBUMIN: 1.7 g/dL — AB (ref 3.5–5.0)
ALT: 48 U/L (ref 17–63)
ANION GAP: 9 (ref 5–15)
AST: 46 U/L — ABNORMAL HIGH (ref 15–41)
Alkaline Phosphatase: 64 U/L (ref 38–126)
BILIRUBIN TOTAL: 0.6 mg/dL (ref 0.3–1.2)
BUN: 32 mg/dL — ABNORMAL HIGH (ref 6–20)
CHLORIDE: 118 mmol/L — AB (ref 101–111)
CO2: 22 mmol/L (ref 22–32)
Calcium: 7.5 mg/dL — ABNORMAL LOW (ref 8.9–10.3)
Creatinine, Ser: 0.91 mg/dL (ref 0.61–1.24)
GFR calc Af Amer: >60 mL/min (ref >60)
Glucose, Bld: 175 mg/dL — ABNORMAL HIGH (ref 65–99)
POTASSIUM: 3.9 mmol/L (ref 3.5–5.1)
Sodium: 149 mmol/L — ABNORMAL HIGH (ref 135–145)
TOTAL PROTEIN: 6 g/dL — AB (ref 6.5–8.1)

## 2016-12-20 LAB — GLUCOSE, CAPILLARY
GLUCOSE-CAPILLARY: 135 mg/dL — AB (ref 65–99)
GLUCOSE-CAPILLARY: 157 mg/dL — AB (ref 65–99)
GLUCOSE-CAPILLARY: 167 mg/dL — AB (ref 65–99)
GLUCOSE-CAPILLARY: 186 mg/dL — AB (ref 65–99)
Glucose-Capillary: 176 mg/dL — ABNORMAL HIGH (ref 65–99)
Glucose-Capillary: 154 mg/dL — ABNORMAL HIGH (ref 65–99)

## 2016-12-20 LAB — CBC
HEMATOCRIT: 31.1 % — AB (ref 39.0–52.0)
Hemoglobin: 9.6 g/dL — ABNORMAL LOW (ref 13.0–17.0)
MCH: 32.4 pg (ref 26.0–34.0)
MCHC: 30.9 g/dL (ref 30.0–36.0)
MCV: 105.1 fL — AB (ref 78.0–100.0)
PLATELETS: 241 10*3/uL (ref 150–400)
RBC: 2.96 MIL/uL — ABNORMAL LOW (ref 4.22–5.81)
RDW: 15.3 % (ref 11.5–15.5)
WBC: 9.2 10*3/uL (ref 4.0–10.5)

## 2016-12-20 LAB — MAGNESIUM: MAGNESIUM: 2.2 mg/dL (ref 1.7–2.4)

## 2016-12-20 LAB — PHOSPHORUS: Phosphorus: 2.3 mg/dL — ABNORMAL LOW (ref 2.5–4.6)

## 2016-12-20 MED ORDER — FREE WATER
200.0000 mL | Status: DC
  Administered 2016-12-20 – 2016-12-23 (×11): 200 mL

## 2016-12-20 MED ORDER — METOPROLOL TARTRATE 25 MG/10 ML ORAL SUSPENSION
100.0000 mg | Freq: Two times a day (BID) | ORAL | Status: DC
  Administered 2016-12-20: 100 mg
  Filled 2016-12-20 (×3): qty 40

## 2016-12-20 NOTE — Progress Notes (Signed)
Paged MD regarding pt's daughter's concern. She wants to know how is the prognosis, regarding code status, etc, waiting response

## 2016-12-20 NOTE — Progress Notes (Addendum)
12/20/2016 2:14 PM  Updated family (daughters, wife) by telephone.  Pt's prognosis is poor and recovery is going very slowly given multiple serious co-morbidities.  They verbalized understanding.  I asked them to seriously think about code status and let me know something soon.  They verbalized understanding.   Maryln Manuel MD

## 2016-12-20 NOTE — Progress Notes (Signed)
PROGRESS NOTE  Jacob Pittman   ZOX:[id number]  DOB: 18-Oct-1945  DOA: 11/26/2016 PCP: Biagio Borg, MD   Brief Narrative:  Jacob Pittman 71 y/o male with EF 15% (ischemic CMP) s/p AICD, HTN, DM, PAD presented on 8/9 with abdominal pain, LA 9.4 and a bowel perforation. Was found to have a perforated pyloric channel ulcer and pneumoperitonitis s/p omental patch repair subsequently on epinephrine and Levophed.  8/12-  TTE LV moderately dilated with EF 15% &diffuse hypokinesis. There is akinesis of the inferolateral and inferior myocardium.  E coli UTI noted 8/13-  right brachial A- line placed and then developed ischemic right arm- vascular consult and transfer from [facility] to [facility]- thrombectomy of brachial artery- heparin Paroxysmal A-fib also noted 8/14- Decreased attenuation involving the right dentate nucleus in the right cerebellum and immediately adjacent cerebellar parenchyma, concerning for recent and potentially acute infarct in this area.  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  Seems to answer questions appropriately.     Assessment & Plan:   Principal Problem: Perforated duodenal bulb ulcer/ peritonitis/   H pylori ulcer Septic shock  Vancomycin 8/10 (x1 dose) Diflucan 8/10 - 8/15 Zosyn 8/10 > 8/21  Flagyl 8/15 > 8/22  Biaxin 8/15 >>  Amoxicillin 8/22 >> on 8/25 >>Unasyn>>Zosyn - BID PPI-  - 8/25  spiking fevers up to 103- cultures negative-  changed amoxil to Unasyn and then Zosyn - central line removed and fevers resolved - 8/26- changed Zosyn back to Amoxil  - 8/27 - no recurrence of fever- CT abd/pelvis  and CXR ordered by gen surgery as they felt he may have another source but I still feel the source was the central line. - 8/28 - temp 99, a bit congested by not in resp distress or hypoxic. As NG got dislodged while tube feeds were running, will need to follow for symptoms of aspiration pneumonia- no change in antibiotics today. - per SLP, still needs to by NPO and will need to cont feeding per NG. Cortrak has been placed.   - wound is clean - Contractor, asked social worker to eval for SNF     Paroxysmal A-fib   - frequent PVFs, vtach, torsades - increased metoprolol to 50 mg BID 9/1 - 8/26- Changed Heparin to Xarelto   - replacing K, Mg as needed, follow closely  B/l basilar atelectasis -pt not using incentive spirometry  Hypernatremia  Persistent, but slowly improving with increased free water boluses, recheck BMP in AM.      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 - on baby aspirin  - Heparin>> Xarelto    Arterial thrombosis - right brachial occurring after A-line - s/p thrombectomy- staples intact  Left arm edema - no DVT on ultrasound on 8/23    Fatty infiltration of liver -  Currently weight and diabetes appear controlled    DM (diabetes mellitus), type 2  - A1c 5.8 on 8/10 - on Metformin at home- sugars well controlled here   CBG (last 3)   Recent Labs   12/20/16 0057 12/20/16 0433 12/20/16 0804  GLUCAP 186* 176* 154*   DVT prophylaxis: heparin Code Status: Full code Family Communication: daughter Jacob Pittman and wife by telephone Disposition Plan: TBD    Consultants:   Neurology  PCCM  Vascular surgery  Gen surgery  Procedures  8/10- ex lap- patch repair of pyloric ucler  8/13- brachial embolectomy 2 D ECHO Left ventricle: The cavity size was moderately dilated. Wall   thickness was normal. Systolic function was severely reduced. The   estimated ejection fraction was 15%. Diffuse hypokinesis. There   is akinesis of the inferolateral and inferior myocardium. The   study is not technically sufficient to allow evaluation of LV   diastolic function. - Aortic valve: There was mild regurgitation. - Mitral valve: There was moderate regurgitation. - Left atrium: The atrium was severely dilated. - Right ventricle: The cavity size was moderately dilated. Systolic   function was moderately reduced. - Right atrium: The atrium was severely dilated. - Tricuspid valve: There was moderate regurgitation. - Pulmonary arteries: Systolic pressure was moderately increased.  Antimicrobials:  Anti-infectives    Start     Dose/Rate Route Frequency Ordered Stop   12/13/16 1000  amoxicillin (AMOXIL) 250 MG/5ML suspension 1,000 mg     1,000 mg Per Tube Every 12 hours 12/13/16 0935 12/16/16 1833   12/12/16 1900  piperacillin-tazobactam (ZOSYN) IVPB 3.375 g  Status:  Discontinued     3.375 g 12.5 mL/hr over 240 Minutes Intravenous Every 8 hours 12/12/16 1759 12/13/16 0911   12/12/16 1400  Ampicillin-Sulbactam (UNASYN) 3 g in sodium chloride 0.9 % 100 mL IVPB  Status:  Discontinued     3 g 200 mL/hr over 30 Minutes Intravenous Every 6 hours 12/12/16 1215 12/12/16 1753   12/10/16 0600  amoxicillin (AMOXIL) 250 MG/5ML suspension 1,000 mg  Status:  Discontinued     1,000 mg Per Tube Every 12 hours 12/09/16 1701 12/12/16 1206   12/09/16 1130   amoxicillin (AMOXIL) 250 MG/5ML suspension 1,000 mg  Status:  Discontinued     1,000 mg Oral Every 12 hours 12/09/16 1117 12/09/16 1118   12/09/16 1130  amoxicillin (AMOXIL) 250 MG/5ML suspension 1,000 mg  Status:  Discontinued     1,000 mg Per Tube Every 12 hours 12/09/16 1118 12/09/16 1701   12/04/16 2200  clarithromycin (BIAXIN) tablet 500 mg     500 mg Per Tube Every 12 hours 12/04/16 1107 12/16/16 2158   12/04/16 2000  piperacillin-tazobactam (ZOSYN) IVPB 3.375 g     3.375 g 12.5 mL/hr over 240 Minutes Intravenous Every 8 hours 12/04/16 1339 12/08/16 2359   12/04/16 1600  metroNIDAZOLE (FLAGYL) tablet 500 mg  Status:  Discontinued     500 mg Per Tube Every 8 hours 12/04/16 1107 12/09/16 1117   12/02/16 1100  clarithromycin (  BIAXIN) 250 MG/5ML suspension 500 mg  Status:  Discontinued     500 mg Per Tube Every 12 hours 12/02/16 1012 12/04/16 1107   12/02/16 1045  metroNIDAZOLE (FLAGYL) 50 mg/ml oral suspension 500 mg  Status:  Discontinued     500 mg Per Tube 3 times daily 12/02/16 1036 12/04/16 1107   12/02/16 1015  metroNIDAZOLE (FLAGYL) 50 mg/ml oral suspension 250 mg  Status:  Discontinued     250 mg Per Tube 4 times daily 12/02/16 1011 12/02/16 1036   11/28/16 0800  fluconazole (DIFLUCAN) IVPB 200 mg  Status:  Discontinued     200 mg 100 mL/hr over 60 Minutes Intravenous Every 24 hours 11/27/16 0624 12/02/16 1018   11/27/16 0630  fluconazole (DIFLUCAN) IVPB 400 mg     400 mg 100 mL/hr over 120 Minutes Intravenous  Once 11/27/16 0622 11/27/16 0841   11/27/16 0600  piperacillin-tazobactam (ZOSYN) IVPB 3.375 g  Status:  Discontinued     3.375 g 12.5 mL/hr over 240 Minutes Intravenous Every 8 hours 11/27/16 0534 12/04/16 1339   11/27/16 0515  piperacillin-tazobactam (ZOSYN) IVPB 3.375 g  Status:  Discontinued     3.375 g 100 mL/hr over 30 Minutes Intravenous  Once 11/27/16 0510 11/27/16 0518   11/27/16 0030  piperacillin-tazobactam (ZOSYN) IVPB 3.375 g     3.375 g 100 mL/hr over 30  Minutes Intravenous  Once 11/27/16 0016 11/27/16 0755   11/27/16 0030  vancomycin (VANCOCIN) IVPB 1000 mg/200 mL premix     1,000 mg 200 mL/hr over 60 Minutes Intravenous  Once 11/27/16 0016 11/27/16 0756     Objective: Vitals:   12/19/16 1800 12/19/16 2058 12/20/16 0617 12/20/16 1100  BP: 110/60 (!) 104/59 (!) 104/36 (!) 100/50  Pulse:  92 92 94  Resp:  20 17   Temp:  99.3 F (37.4 C) 97.9 F (36.6 C)   TempSrc:  Oral Oral   SpO2: 100% 100% 97%   Weight:   85.9 kg (189 lb 6.4 oz)   Height:        Intake/Output Summary (Last 24 hours) at 12/20/16 1119 Last data filed at 12/19/16 1900  Gross per 24 hour  Intake           549.25 ml  Output              700 ml  Net          -150.75 ml   Filed Weights   12/18/16 0401 12/19/16 0606 12/20/16 0617  Weight: 84.5 kg (186 lb 3.2 oz) 85.1 kg (187 lb 9.6 oz) 85.9 kg (189 lb 6.4 oz)   Examination: General exam: NAD.  answer question appropriately when awakened, difficult to understand language  HEENT: PERRLA, oral mucosa moist, no sclera icterus or thrush Respiratory system: Clear to auscultation. Respiratory effort normal,  persistent upper airway congestion Cardiovascular system: S1 & S2 heard,  No murmurs  Gastrointestinal system: Abdomen soft, non-tender, nondistended. Normal bowel sound. No organomegaly- cortrak in place Central nervous system: Alert and oriented to person. No focal neurological deficits. Extremities: No cyanosis, clubbing or edema Skin: No rashes or ulcers Psychiatry:  flat affect  Data Reviewed: I have personally reviewed following labs and imaging studies  CBC:  Recent Labs Lab 12/13/16 1338 12/14/16 0435 12/16/16 0812 12/17/16 0427 12/18/16 0207 12/19/16 0638 12/20/16 0540  WBC 9.4 6.7 6.5 5.8 5.5 7.1 9.2  NEUTROABS 8.2* 5.4  --   --   --   --   --  HGB 10.9* 9.6* 9.3* 9.6* 9.3* 9.9* 9.6*  HCT 36.4* 31.3* 31.9* 32.5* 30.7* 31.8* 31.1*  MCV 109.6* 109.8* 110.8* 110.9* 109.6* 106.4* 105.1*  PLT  436* 381 295 266 246 244 811   Basic Metabolic Panel:  Recent Labs Lab 12/14/16 0435  12/15/16 1453 12/16/16 0454 12/17/16 0427 12/18/16 0207 12/18/16 2001 12/19/16 0638 12/20/16 0540  NA  --   < >  --  156* 157* 152*  --  147* 149*  K  --   < >  --  3.5 3.7 3.4* 3.7 3.8 3.9  CL  --   < >  --  123* 124* 122*  --  118* 118*  CO2  --   < >  --  _0 --  21* 22  GLUCOSE  --   < >  --  138* 155* 168*  --  175* 175*  BUN  --   < >  --  34* 33* 34*  --  30* 32*  CREATININE  --   < >  --  0.88 0.87 0.82  --  0.88 0.91  CALCIUM  --   < >  --  8.3* 8.0* 7.7*  --  7.6* 7.5*  MG 2.3  --  2.2  --   --  2.1  --  2.5* 2.2  PHOS  --   --   --   --   --   --   --   --  2.3*  < > = values in this interval not displayed. GFR: Estimated Creatinine Clearance: 81.7 mL/min (by C-G formula based on SCr of 0.91 mg/dL). Liver Function Tests:  Recent Labs Lab 12/17/16 0427 12/20/16 0540  AST 65* 46*  ALT 47 48  ALKPHOS 60 64  BILITOT 0.7 0.6  PROT 5.9* 6.0*  ALBUMIN 1.8* 1.7*   No results for input(s): LIPASE, AMYLASE in the last 168 hours. No results for input(s): AMMONIA in the last 168 hours. Coagulation Profile: No results for input(s): INR, PROTIME in the last 168 hours. Cardiac Enzymes: No results for input(s): CKTOTAL, CKMB, CKMBINDEX, TROPONINI in the last 168 hours. BNP (last 3 results) No results for input(s): PROBNP in the last 8760 hours. HbA1C: No results for input(s): HGBA1C in the last 72 hours. CBG:  Recent Labs Lab 12/19/16 1637 12/19/16 2008 12/20/16 0057 12/20/16 0433 12/20/16 0804  GLUCAP 146* 172* 186* 176* 154*   Lipid Profile: No results for input(s): CHOL, HDL, LDLCALC, TRIG, CHOLHDL, LDLDIRECT in the last 72 hours. Thyroid Function Tests: No results for input(s): TSH, T4TOTAL, FREET4, T3FREE, THYROIDAB in the last 72 hours. Anemia Panel: No results for input(s): VITAMINB12, FOLATE, FERRITIN, TIBC, IRON, RETICCTPCT in the last 72 hours. Urine  analysis:    Component Value Date/Time   COLORURINE YELLOW 09/10/2016 [place] 09/10/2016 1002   LABSPEC 1.025 09/10/2016 1002   PHURINE 6.5 09/10/2016 1002   GLUCOSEU NEGATIVE 09/10/2016 1002   [place] 09/10/2016 1002   BILIRUBINUR SMALL (A) 09/10/2016 1002   KETONESUR NEGATIVE 09/10/2016 1002   PROTEINUR NEGATIVE 06/10/2010 0954   UROBILINOGEN >=8.0 (A) 09/10/2016 1002   NITRITE NEGATIVE 09/10/2016 1002   [place] 09/10/2016 1002    Recent Results (from the past 240 hour(s))  Culture, Urine     Status: None   Collection Time: 12/10/16 11:58 AM  Result Value Ref Range Status   Specimen Description URINE, RANDOM  Final   Special Requests NONE  Final  Culture NO GROWTH  Final   Report Status 12/11/2016 FINAL  Final  Culture, blood (routine x 2)     Status: None   Collection Time: 12/10/16 12:39 PM  Result Value Ref Range Status   Specimen Description BLOOD RIGHT HAND  Final   Special Requests IN PEDIATRIC BOTTLE Blood Culture adequate volume  Final   Culture NO GROWTH 5 DAYS  Final   Report Status 12/15/2016 FINAL  Final  Culture, blood (routine x 2)     Status: None   Collection Time: 12/10/16 12:39 PM  Result Value Ref Range Status   Specimen Description BLOOD RIGHT HAND  Final   Special Requests IN PEDIATRIC BOTTLE Blood Culture adequate volume  Final   Culture NO GROWTH 5 DAYS  Final   Report Status 12/15/2016 FINAL  Final  Culture, blood (routine x 2)     Status: None (Preliminary result)   Collection Time: 12/16/16  4:54 AM  Result Value Ref Range Status   Specimen Description BLOOD LEFT HAND  Final   Special Requests IN PEDIATRIC BOTTLE Blood Culture adequate volume  Final   Culture NO GROWTH 4 DAYS  Final   Report Status PENDING  Incomplete  Culture, blood (routine x 2)     Status: None (Preliminary result)   Collection Time: 12/16/16  4:59 AM  Result Value Ref Range Status   Specimen Description BLOOD LEFT HAND  Final    Special Requests IN PEDIATRIC BOTTLE Blood Culture adequate volume  Final   Culture NO GROWTH 4 DAYS  Final   Report Status PENDING  Incomplete    Radiology Studies: Dg Chest Port 1 View  Result Date: 12/19/2016 CLINICAL DATA:  Atelectasis EXAM: PORTABLE CHEST 1 VIEW COMPARISON:  S8 01/07/2017 mean FINDINGS: NG tube has been exchanged for a feeding tube which extends below the hemidiaphragms into the stomach. Single lead left subclavian pacemaker/ defibrillator. Similar cardiomegaly with mild interstitial edema pattern and basilar atelectasis. No enlarging effusion or pneumothorax. No new focal collapse or consolidation. IMPRESSION: Stable cardiomegaly with mild interstitial edema pattern No significant interval change. Electronically Signed   By: Jerilynn Mages.  Shick M.D.   On: 12/19/2016 13:53   Scheduled Meds: . aspirin  81 mg Per Tube Daily  . chlorhexidine gluconate (MEDLINE KIT)  15 mL Mouth Rinse BID  . Chlorhexidine Gluconate Cloth  6 each Topical Daily  . feeding supplement (PRO-STAT SUGAR FREE 64)  30 mL Per Tube BID  . free water  200 mL Per Tube Q3H  . insulin aspart  0-20 Units Subcutaneous Q4H  . mouth rinse  15 mL Mouth Rinse QID  . metoprolol tartrate  50 mg Per Tube BID  . pantoprazole sodium  40 mg Per Tube BID  . rivaroxaban  20 mg Per Tube Daily  . sodium chloride flush  10-40 mL Intracatheter Q12H   Continuous Infusions: . sodium chloride Stopped (12/14/16 0953)  . sodium chloride 500 mL (12/03/16 0126)  . feeding supplement (VITAL 1.5 CAL) 1,000 mL (12/20/16 0825)    LOS: 23 days   Time spent in minutes:15  Irwin Brakeman, MD [facility] Pager: [phone] [email or web address] Password [email or web address] 12/20/2016, 11:19 AM

## 2016-12-20 NOTE — Progress Notes (Signed)
Progress Note  Patient Name: Jacob Pittman Date of Encounter: 12/20/2016  Primary Cardiologist: Dr [place] Primary EP:  Dr Lovena Le  Subjective   Doing well today, the patient denies CP or SOB.  No new concerns  Inpatient Medications    Scheduled Meds: . aspirin  81 mg Per Tube Daily  . chlorhexidine gluconate (MEDLINE KIT)  15 mL Mouth Rinse BID  . Chlorhexidine Gluconate Cloth  6 each Topical Daily  . feeding supplement (PRO-STAT SUGAR FREE 64)  30 mL Per Tube BID  . free water  200 mL Per Tube Q3H  . insulin aspart  0-20 Units Subcutaneous Q4H  . mouth rinse  15 mL Mouth Rinse QID  . metoprolol tartrate  100 mg Per Tube BID  . pantoprazole sodium  40 mg Per Tube BID  . rivaroxaban  20 mg Per Tube Daily  . sodium chloride flush  10-40 mL Intracatheter Q12H   Continuous Infusions: . sodium chloride Stopped (12/14/16 0953)  . sodium chloride 500 mL (12/03/16 0126)  . feeding supplement (VITAL 1.5 CAL) 1,000 mL (12/20/16 0825)   PRN Meds: sodium chloride, acetaminophen (TYLENOL) oral liquid 160 mg/5 mL, artificial tears, ondansetron **OR** ondansetron (ZOFRAN) IV, sodium chloride flush   Vital Signs    Vitals:   12/19/16 1800 12/19/16 2058 12/20/16 0617 12/20/16 1100  BP: 110/60 (!) 104/59 (!) 104/36 (!) 100/50  Pulse:  92 92 94  Resp:  20 17   Temp:  99.3 F (37.4 C) 97.9 F (36.6 C)   TempSrc:  Oral Oral   SpO2: 100% 100% 97%   Weight:   189 lb 6.4 oz (85.9 kg)   Height:        Intake/Output Summary (Last 24 hours) at 12/20/16 1127 Last data filed at 12/19/16 1900  Gross per 24 hour  Intake           549.25 ml  Output              700 ml  Net          -150.75 ml   Filed Weights   12/18/16 0401 12/19/16 0606 12/20/16 0617  Weight: 186 lb 3.2 oz (84.5 kg) 187 lb 9.6 oz (85.1 kg) 189 lb 6.4 oz (85.9 kg)    Telemetry    Nonsustained polymorphic VT,  nonsustained SVT - Personally Reviewed  Physical Exam   GEN- The patient is ill appearing, alert and  oriented x 3 today.   Head- normocephalic, atraumatic Eyes-  Sclera clear, conjunctiva pink Ears- hearing intact Oropharynx- clear Neck- supple, Lungs- decreased SBS Heart- Regular rate and rhythm  GI- distended Extremities- no clubbing, cyanosis,  + dependant edema MS- diffuse atrophy Skin- no rash or lesion Psych- euthymic mood, full affect Neuro- strength and sensation are intact   Labs    Chemistry Recent Labs Lab 12/17/16 0427 12/18/16 0207 12/18/16 2001 12/19/16 0638 12/20/16 0540  NA 157* 152*  --  147* 149*  K 3.7 3.4* 3.7 3.8 3.9  CL 124* 122*  --  118* 118*  CO2 24 24  --  21* 22  GLUCOSE 155* 168*  --  175* 175*  BUN 33* 34*  --  30* 32*  CREATININE 0.87 0.82  --  0.88 0.91  CALCIUM 8.0* 7.7*  --  7.6* 7.5*  PROT 5.9*  --   --   --  6.0*  ALBUMIN 1.8*  --   --   --  1.7*  AST 65*  --   --   --  46*  ALT 47  --   --   --  48  ALKPHOS 60  --   --   --  64  BILITOT 0.7  --   --   --  0.6  GFRNONAA >60 >60  --  >60 >60  GFRAA >60 >60  --  >60 >60  ANIONGAP 9 6  --  8 9     Hematology Recent Labs Lab 12/18/16 0207 12/19/16 0638 12/20/16 0540  WBC 5.5 7.1 9.2  RBC 2.80* 2.99* 2.96*  HGB 9.3* 9.9* 9.6*  HCT 30.7* 31.8* 31.1*  MCV 109.6* 106.4* 105.1*  MCH 33.2 33.1 32.4  MCHC 30.3 31.1 30.9  RDW 15.3 15.3 15.3  PLT 246 244 241    Cardiac EnzymesNo results for input(s): TROPONINI in the last 168 hours. No results for input(s): TROPIPOC in the last 168 hours.     Patient Profile     71 y.o. male with nonischemic CM and prior ICD now with sustained VT and ICD shock in the setting of advanced medical illness.  Assessment & Plan    1. Polymorphic VT Nonsustained events Avoid QT prolonging medicines Will increase metoprolol to 117m BID Hopefully will improve as his condition improves  2. Nonischemic CM Continue medical therapy as able  3. Afib/ nonsustained SVT On xarelto,  Suspected embolic CVA this admit  Prognosis is guarded Pt at  risk for decompensation, advanced arrhythmias.  A high level of decision making was required for this encounter. EP to follow  JThompson GrayerMD, FColumbia Surgical Institute LLC9/05/2016 11:27 AM

## 2016-12-20 NOTE — Progress Notes (Signed)
Pt is alert but only response to speech only. Alert and oriented times 1.Follows command, and nod his head per being asked his pain level, turning and positioning continue, mouth care continue at regular interval,  NG tube feeding continue and flushed set up in between the feeding, dressing change has been done, suctioning done prn, meet with daughter and gave her update regarding current situation and asked MD to call her as well, will continue to monitor the patient

## 2016-12-21 ENCOUNTER — Inpatient Hospital Stay (HOSPITAL_COMMUNITY): Payer: Medicare PPO

## 2016-12-21 DIAGNOSIS — I428 Other cardiomyopathies: Secondary | ICD-10-CM

## 2016-12-21 DIAGNOSIS — K631 Perforation of intestine (nontraumatic): Secondary | ICD-10-CM

## 2016-12-21 DIAGNOSIS — I6340 Cerebral infarction due to embolism of unspecified cerebral artery: Secondary | ICD-10-CM

## 2016-12-21 DIAGNOSIS — I878 Other specified disorders of veins: Secondary | ICD-10-CM

## 2016-12-21 DIAGNOSIS — L89620 Pressure ulcer of left heel, unstageable: Secondary | ICD-10-CM

## 2016-12-21 LAB — BASIC METABOLIC PANEL
ANION GAP: 7 (ref 5–15)
BUN: 33 mg/dL — ABNORMAL HIGH (ref 6–20)
CHLORIDE: 113 mmol/L — AB (ref 101–111)
CO2: 24 mmol/L (ref 22–32)
Calcium: 7.4 mg/dL — ABNORMAL LOW (ref 8.9–10.3)
Creatinine, Ser: 0.94 mg/dL (ref 0.61–1.24)
GFR calc Af Amer: >60 mL/min (ref >60)
GLUCOSE: 100 mg/dL — AB (ref 65–99)
POTASSIUM: 4.1 mmol/L (ref 3.5–5.1)
Sodium: 144 mmol/L (ref 135–145)

## 2016-12-21 LAB — BLOOD GAS, ARTERIAL
Acid-Base Excess: 0.6 mmol/L (ref 0.0–2.0)
Bicarbonate: 23.8 mmol/L (ref 20.0–28.0)
DRAWN BY: 42624
O2 CONTENT: 2 L/min
O2 Saturation: 79.4 %
PATIENT TEMPERATURE: 98.6
PCO2 ART: 32.7 mmHg (ref 32.0–48.0)
PO2 ART: 43.4 mmHg — AB (ref 83.0–108.0)
pH, Arterial: 7.476 — ABNORMAL HIGH (ref 7.350–7.450)

## 2016-12-21 LAB — CBC WITH DIFFERENTIAL/PLATELET
BASOS ABS: 0 10*3/uL (ref 0.0–0.1)
Basophils Relative: 0 %
Eosinophils Absolute: 0 10*3/uL (ref 0.0–0.7)
Eosinophils Relative: 1 %
HEMATOCRIT: 27.3 % — AB (ref 39.0–52.0)
Hemoglobin: 8.4 g/dL — ABNORMAL LOW (ref 13.0–17.0)
LYMPHS ABS: 1.5 10*3/uL (ref 0.7–4.0)
LYMPHS PCT: 20 %
MCH: 32.6 pg (ref 26.0–34.0)
MCHC: 30.8 g/dL (ref 30.0–36.0)
MCV: 105.8 fL — AB (ref 78.0–100.0)
MONO ABS: 0.4 10*3/uL (ref 0.1–1.0)
Monocytes Relative: 6 %
NEUTROS ABS: 5.4 10*3/uL (ref 1.7–7.7)
Neutrophils Relative %: 73 %
Platelets: 247 10*3/uL (ref 150–400)
RBC: 2.58 MIL/uL — ABNORMAL LOW (ref 4.22–5.81)
RDW: 15.2 % (ref 11.5–15.5)
WBC: 7.3 10*3/uL (ref 4.0–10.5)

## 2016-12-21 LAB — CBC
HEMATOCRIT: 29.5 % — AB (ref 39.0–52.0)
Hemoglobin: 9.1 g/dL — ABNORMAL LOW (ref 13.0–17.0)
MCH: 32.9 pg (ref 26.0–34.0)
MCHC: 30.8 g/dL (ref 30.0–36.0)
MCV: 106.5 fL — AB (ref 78.0–100.0)
PLATELETS: 215 10*3/uL (ref 150–400)
RBC: 2.77 MIL/uL — ABNORMAL LOW (ref 4.22–5.81)
RDW: 15.2 % (ref 11.5–15.5)
WBC: 8.5 10*3/uL (ref 4.0–10.5)

## 2016-12-21 LAB — CULTURE, BLOOD (ROUTINE X 2)
CULTURE: NO GROWTH
Culture: NO GROWTH
Special Requests: ADEQUATE
Special Requests: ADEQUATE

## 2016-12-21 LAB — GLUCOSE, CAPILLARY
GLUCOSE-CAPILLARY: 189 mg/dL — AB (ref 65–99)
GLUCOSE-CAPILLARY: 105 mg/dL — AB (ref 65–99)
Glucose-Capillary: 119 mg/dL — ABNORMAL HIGH (ref 65–99)
Glucose-Capillary: 193 mg/dL — ABNORMAL HIGH (ref 65–99)
Glucose-Capillary: 194 mg/dL — ABNORMAL HIGH (ref 65–99)
Glucose-Capillary: 91 mg/dL (ref 65–99)
Glucose-Capillary: 97 mg/dL (ref 65–99)

## 2016-12-21 LAB — COMPREHENSIVE METABOLIC PANEL
ALBUMIN: 1.6 g/dL — AB (ref 3.5–5.0)
ALK PHOS: 155 U/L — AB (ref 38–126)
ALT: 96 U/L — AB (ref 17–63)
AST: 201 U/L — ABNORMAL HIGH (ref 15–41)
Anion gap: 8 (ref 5–15)
BUN: 36 mg/dL — ABNORMAL HIGH (ref 6–20)
CALCIUM: 7.5 mg/dL — AB (ref 8.9–10.3)
CHLORIDE: 117 mmol/L — AB (ref 101–111)
CO2: 22 mmol/L (ref 22–32)
CREATININE: 0.94 mg/dL (ref 0.61–1.24)
GFR calc non Af Amer: >60 mL/min (ref >60)
GLUCOSE: 195 mg/dL — AB (ref 65–99)
Potassium: 3.7 mmol/L (ref 3.5–5.1)
SODIUM: 147 mmol/L — AB (ref 135–145)
Total Bilirubin: 0.8 mg/dL (ref 0.3–1.2)
Total Protein: 5.8 g/dL — ABNORMAL LOW (ref 6.5–8.1)

## 2016-12-21 LAB — LACTIC ACID, PLASMA: LACTIC ACID, VENOUS: 1.1 mmol/L (ref 0.5–1.9)

## 2016-12-21 LAB — MAGNESIUM: Magnesium: 2.2 mg/dL (ref 1.7–2.4)

## 2016-12-21 MED ORDER — LIDOCAINE HCL (PF) 1 % IJ SOLN
INTRAMUSCULAR | Status: AC
  Filled 2016-12-21: qty 5

## 2016-12-21 MED ORDER — PIPERACILLIN-TAZOBACTAM 3.375 G IVPB
3.3750 g | Freq: Three times a day (TID) | INTRAVENOUS | Status: DC
  Administered 2016-12-21 – 2017-01-01 (×32): 3.375 g via INTRAVENOUS
  Filled 2016-12-21 (×36): qty 50

## 2016-12-21 MED ORDER — LIDOCAINE HCL (PF) 1 % IJ SOLN: 5.0000 mL | Freq: Once | INTRAMUSCULAR | Status: DC

## 2016-12-21 MED ORDER — IOPAMIDOL (ISOVUE-300) INJECTION 61%
INTRAVENOUS | Status: AC
  Administered 2016-12-21: 80 mL via INTRAVENOUS
  Filled 2016-12-21: qty 100

## 2016-12-21 MED ORDER — CHLORHEXIDINE GLUCONATE 4 % EX LIQD
Freq: Once | CUTANEOUS | Status: DC
  Filled 2016-12-21: qty 15

## 2016-12-21 MED ORDER — SODIUM CHLORIDE 0.9 % IV BOLUS (SEPSIS)
500.0000 mL | Freq: Once | INTRAVENOUS | Status: AC
  Administered 2016-12-21: 500 mL via INTRAVENOUS

## 2016-12-21 MED ORDER — IOPAMIDOL (ISOVUE-300) INJECTION 61%
15.0000 mL | INTRAVENOUS | Status: AC
  Administered 2016-12-21 (×2): 15 mL via ORAL

## 2016-12-21 MED ORDER — DEXTROSE 5 % IV SOLN
INTRAVENOUS | Status: AC
  Administered 2016-12-21: via INTRAVENOUS

## 2016-12-21 NOTE — Progress Notes (Signed)
Patient back from having abdominal CT with left upper extremity more swollen than before.  This nurse was notified that contrast infiltrated in patient's left arm while patient was having the abdominal CT done. Left arm taut and warm to touch.  Warm compress placed per recommendation.  MD notified via paging.  Elnita Maxwell, RN

## 2016-12-21 NOTE — Progress Notes (Addendum)
PROGRESS NOTE  Jacob Pittman   YTK:[id number]  DOB: [birth year]  DOA: 11/26/2016 PCP: Jacob Borg, MD   Brief Narrative:  Jacob Pittman 71 y/o male with EF 15% (ischemic CMP) s/p AICD, HTN, DM, PAD presented on 8/9 with abdominal pain, LA 9.4 and a bowel perforation. Was found to have a perforated pyloric channel ulcer and pneumoperitonitis s/p omental patch repair subsequently on epinephrine and Levophed.  8/12-  TTE LV moderately dilated with EF 15% &diffuse hypokinesis. There is akinesis of the inferolateral and inferior myocardium.  E coli UTI noted 8/13-  right brachial A- line placed and then developed ischemic right arm- vascular consult and transfer from [facility] to [facility]- thrombectomy of brachial artery- heparin Paroxysmal A-fib also noted 8/14- Decreased attenuation involving the right dentate nucleus in the right cerebellum and immediately adjacent cerebellar parenchyma, concerning for recent and potentially acute infarct in this area.  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  I came in room and noticed foul smell of stool.  RN tells me that there is stool flowing from abdominal wound.  I open dressings and find large amount of stool flowing out of the wound.  Wound was cleansed and redressed at bedside.  I called the surgical team and asked them to please come evaluate.     Assessment & Plan:   Perforated duodenal bulb ulcer/ peritonitis/   H pylori ulcer Septic shock  Vancomycin 8/10 (x1 dose) Diflucan 8/10 - 8/15 Zosyn 8/10 > 8/21  Flagyl 8/15 > 8/22   Biaxin 8/15 >>  Amoxicillin 8/22 >> on 8/25 >>Unasyn>>Zosyn - BID PPI-  - 8/25  spiking fevers up to 103- cultures negative-  changed amoxil to Unasyn and then Zosyn - central line removed and fevers resolved - 8/26- changed Zosyn back to Amoxil  - 8/27 - no recurrence of fever- CT abd/pelvis and CXR ordered by gen surgery as they felt he may have another source but I still feel the source was the central line. - 8/28 - temp 99, a bit congested by not in resp distress or hypoxic. As NG got dislodged while tube feeds were running, will need to follow for symptoms of aspiration pneumonia- no change in antibiotics today. - per SLP, still needs to by NPO and will need to cont feeding per NG. Cortrak has been placed.   - Contractor, asked social worker to eval for SNF   - 9/3 - stool noted to be coming out of abdominal wound and LFTs spiked, asked general surgery to come and re-evaluate   Paroxysmal A-fib   - frequent PVFs, vtach, torsades - increased metoprolol to 50 mg BID 9/1 - 8/26- Changed Heparin to Xarelto   - replacing K, Mg as needed, follow closely  B/l basilar atelectasis -pt not using incentive spirometry  Hypernatremia  increased free water boluses on 9/2, follow.      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 - on baby aspirin  - Heparin>> Xarelto    Arterial  thrombosis - right brachial occurring after A-line - s/p thrombectomy- staples intact  Left arm edema - no DVT on ultrasound on 8/23    Fatty infiltration of liver -  Currently weight and diabetes appear controlled    DM (diabetes mellitus), type 2  - A1c 5.8 on 8/10 - on Metformin at home- sugars well controlled here   CBG (last 3)   Recent Labs  12/21/16 0055 12/21/16 0504 12/21/16 0721  GLUCAP 193* 194* 189*   DVT prophylaxis: heparin Code Status: Full code Family Communication: daughter Jacob Pittman and wife by telephone 9/2 Disposition Plan: TBD    Consultants:   Neurology  PCCM  Vascular surgery  Gen surgery  Procedures  8/10- ex lap- patch repair of pyloric ucler  8/13- brachial embolectomy 2 D ECHO Left ventricle: The cavity size was moderately dilated. Wall   thickness was normal. Systolic function was severely reduced. The   estimated ejection fraction was 15%. Diffuse hypokinesis. There   is akinesis of the inferolateral and inferior myocardium. The   study is not technically sufficient to allow evaluation of LV   diastolic function. - Aortic valve: There was mild regurgitation. - Mitral valve: There was moderate regurgitation. - Left atrium: The atrium was severely dilated. - Right ventricle: The cavity size was moderately dilated. Systolic   function was moderately reduced. - Right atrium: The atrium was severely dilated. - Tricuspid valve: There was moderate regurgitation. - Pulmonary arteries: Systolic pressure was moderately increased.  Antimicrobials:  Anti-infectives    Start     Dose/Rate Route Frequency Ordered Stop   12/13/16 1000  amoxicillin (AMOXIL) 250 MG/5ML suspension 1,000 mg     1,000 mg Per Tube Every 12 hours 12/13/16 0935 12/16/16 1833   12/12/16 1900  piperacillin-tazobactam (ZOSYN) IVPB 3.375 g  Status:  Discontinued     3.375 g 12.5 mL/hr over 240 Minutes Intravenous Every 8 hours 12/12/16 1759 12/13/16 0911   12/12/16  1400  Ampicillin-Sulbactam (UNASYN) 3 g in sodium chloride 0.9 % 100 mL IVPB  Status:  Discontinued     3 g 200 mL/hr over 30 Minutes Intravenous Every 6 hours 12/12/16 1215 12/12/16 1753   12/10/16 0600  amoxicillin (AMOXIL) 250 MG/5ML suspension 1,000 mg  Status:  Discontinued     1,000 mg Per Tube Every 12 hours 12/09/16 1701 12/12/16 1206   12/09/16 1130  amoxicillin (AMOXIL) 250 MG/5ML suspension 1,000 mg  Status:  Discontinued     1,000 mg Oral Every 12 hours 12/09/16 1117 12/09/16 1118   12/09/16 1130  amoxicillin (AMOXIL) 250 MG/5ML suspension 1,000 mg  Status:  Discontinued     1,000 mg Per Tube Every 12 hours 12/09/16  1118 12/09/16 1701   12/04/16 2200  clarithromycin (BIAXIN) tablet 500 mg     500 mg Per Tube Every 12 hours 12/04/16 1107 12/16/16 2158   12/04/16 2000  piperacillin-tazobactam (ZOSYN) IVPB 3.375 g     3.375 g 12.5 mL/hr over 240 Minutes Intravenous Every 8 hours 12/04/16 1339 12/08/16 2359   12/04/16 1600  metroNIDAZOLE (FLAGYL) tablet 500 mg  Status:  Discontinued     500 mg Per Tube Every 8 hours 12/04/16 1107 12/09/16 1117   12/02/16 1100  clarithromycin (BIAXIN) 250 MG/5ML suspension 500 mg  Status:  Discontinued     500 mg Per Tube Every 12 hours 12/02/16 1012 12/04/16 1107   12/02/16 1045  metroNIDAZOLE (FLAGYL) 50 mg/ml oral suspension 500 mg  Status:  Discontinued     500 mg Per Tube 3 times daily 12/02/16 1036 12/04/16 1107   12/02/16 1015  metroNIDAZOLE (FLAGYL) 50 mg/ml oral suspension 250 mg  Status:  Discontinued     250 mg Per Tube 4 times daily 12/02/16 1011 12/02/16 1036   11/28/16 0800  fluconazole (DIFLUCAN) IVPB 200 mg  Status:  Discontinued     200 mg 100 mL/hr over 60 Minutes Intravenous Every 24 hours 11/27/16 0624 12/02/16 1018   11/27/16 0630  fluconazole (DIFLUCAN) IVPB 400 mg     400 mg 100 mL/hr over 120 Minutes Intravenous  Once 11/27/16 0622 11/27/16 0841   11/27/16 0600  piperacillin-tazobactam (ZOSYN) IVPB 3.375 g  Status:   Discontinued     3.375 g 12.5 mL/hr over 240 Minutes Intravenous Every 8 hours 11/27/16 0534 12/04/16 1339   11/27/16 0515  piperacillin-tazobactam (ZOSYN) IVPB 3.375 g  Status:  Discontinued     3.375 g 100 mL/hr over 30 Minutes Intravenous  Once 11/27/16 0510 11/27/16 0518   11/27/16 0030  piperacillin-tazobactam (ZOSYN) IVPB 3.375 g     3.375 g 100 mL/hr over 30 Minutes Intravenous  Once 11/27/16 0016 11/27/16 0755   11/27/16 0030  vancomycin (VANCOCIN) IVPB 1000 mg/200 mL premix     1,000 mg 200 mL/hr over 60 Minutes Intravenous  Once 11/27/16 0016 11/27/16 0756     Objective: Vitals:   12/20/16 1330 12/20/16 2025 12/20/16 2354 12/21/16 0602  BP:  (!) 93/59 (!) 102/48 (!) 105/47  Pulse:  88 93 85  Resp:  18 (!) 22 18  Temp: 98.6 F (37 C) 98.7 F (37.1 C) 99.5 F (37.5 C) 98.6 F (37 C)  TempSrc: Oral Oral Oral Oral  SpO2:  98% 97% 99%  Weight:    78 kg (171 lb 14.4 oz)  Height:        Intake/Output Summary (Last 24 hours) at 12/21/16 0734 Last data filed at 12/21/16 0500  Gross per 24 hour  Intake                0 ml  Output             1101 ml  Net            -1101 ml   Filed Weights   12/19/16 0606 12/20/16 0617 12/21/16 0602  Weight: 85.1 kg (187 lb 9.6 oz) 85.9 kg (189 lb 6.4 oz) 78 kg (171 lb 14.4 oz)   Examination: General exam: NAD.  answer question appropriately when awakened, difficult to understand language  HEENT: PERRLA, oral mucosa moist, no sclera icterus or thrush Respiratory system: Clear to auscultation. Respiratory effort normal,  persistent upper airway congestion Cardiovascular system: S1 & S2  heard,  No murmurs  Gastrointestinal system: Abdomen wound with stool flowing from it.  Abdomen mildly distended.  cortrak in place Central nervous system: Alert and oriented to person. No focal neurological deficits. Extremities: No cyanosis, clubbing or edema Skin: No rashes or ulcers Psychiatry:  flat affect  Data Reviewed: I have personally  reviewed following labs and imaging studies  CBC:  Recent Labs Lab 12/16/16 0812 12/17/16 0427 12/18/16 0207 12/19/16 0638 12/20/16 0540  WBC 6.5 5.8 5.5 7.1 9.2  HGB 9.3* 9.6* 9.3* 9.9* 9.6*  HCT 31.9* 32.5* 30.7* 31.8* 31.1*  MCV 110.8* 110.9* 109.6* 106.4* 105.1*  PLT 295 266 246 244 001   Basic Metabolic Panel:  Recent Labs Lab 12/15/16 1453  12/17/16 0427 12/18/16 0207 12/18/16 2001 12/19/16 0638 12/20/16 0540 12/21/16 0421  NA  --   < > 157* 152*  --  147* 149* 147*  K  --   < > 3.7 3.4* 3.7 3.8 3.9 3.7  CL  --   < > 124* 122*  --  118* 118* 117*  CO2  --   < > 24 24  --  21* 22 22  GLUCOSE  --   < > 155* 168*  --  175* 175* 195*  BUN  --   < > 33* 34*  --  30* 32* 36*  CREATININE  --   < > 0.87 0.82  --  0.88 0.91 0.94  CALCIUM  --   < > 8.0* 7.7*  --  7.6* 7.5* 7.5*  MG 2.2  --   --  2.1  --  2.5* 2.2 2.2  PHOS  --   --   --   --   --   --  2.3*  --   < > = values in this interval not displayed. GFR: Estimated Creatinine Clearance: 77.2 mL/min (by C-G formula based on SCr of 0.94 mg/dL). Liver Function Tests:  Recent Labs Lab 12/17/16 0427 12/20/16 0540 12/21/16 0421  AST 65* 46* 201*  ALT 47 48 96*  ALKPHOS 60 64 155*  BILITOT 0.7 0.6 0.8  PROT 5.9* 6.0* 5.8*  ALBUMIN 1.8* 1.7* 1.6*   No results for input(s): LIPASE, AMYLASE in the last 168 hours. No results for input(s): AMMONIA in the last 168 hours. Coagulation Profile: No results for input(s): INR, PROTIME in the last 168 hours. Cardiac Enzymes: No results for input(s): CKTOTAL, CKMB, CKMBINDEX, TROPONINI in the last 168 hours. BNP (last 3 results) No results for input(s): PROBNP in the last 8760 hours. HbA1C: No results for input(s): HGBA1C in the last 72 hours. CBG:  Recent Labs Lab 12/20/16 1622 12/20/16 1950 12/21/16 0055 12/21/16 0504 12/21/16 0721  GLUCAP 157* 135* 193* 194* 189*   Lipid Profile: No results for input(s): CHOL, HDL, LDLCALC, TRIG, CHOLHDL, LDLDIRECT in the  last 72 hours. Thyroid Function Tests: No results for input(s): TSH, T4TOTAL, FREET4, T3FREE, THYROIDAB in the last 72 hours. Anemia Panel: No results for input(s): VITAMINB12, FOLATE, FERRITIN, TIBC, IRON, RETICCTPCT in the last 72 hours. Urine analysis:    Component Value Date/Time   COLORURINE YELLOW 09/10/2016 1002   APPEARANCEUR CLEAR 09/10/2016 1002   LABSPEC 1.025 09/10/2016 1002   PHURINE 6.5 09/10/2016 1002   GLUCOSEU NEGATIVE 09/10/2016 1002   [place] 09/10/2016 1002   BILIRUBINUR SMALL (A) 09/10/2016 1002   [place] 09/10/2016 1002   PROTEINUR NEGATIVE 06/10/2010 0954   UROBILINOGEN >=8.0 (A) 09/10/2016 1002   NITRITE NEGATIVE 09/10/2016 1002  LEUKOCYTESUR NEGATIVE 09/10/2016 1002    Recent Results (from the past 240 hour(s))  Culture, blood (routine x 2)     Status: None (Preliminary result)   Collection Time: 12/16/16  4:54 AM  Result Value Ref Range Status   Specimen Description BLOOD LEFT HAND  Final   Special Requests IN PEDIATRIC BOTTLE Blood Culture adequate volume  Final   Culture NO GROWTH 4 DAYS  Final   Report Status PENDING  Incomplete  Culture, blood (routine x 2)     Status: None (Preliminary result)   Collection Time: 12/16/16  4:59 AM  Result Value Ref Range Status   Specimen Description BLOOD LEFT HAND  Final   Special Requests IN PEDIATRIC BOTTLE Blood Culture adequate volume  Final   Culture NO GROWTH 4 DAYS  Final   Report Status PENDING  Incomplete    Radiology Studies: Dg Chest Port 1 View  Result Date: 12/19/2016 CLINICAL DATA:  Atelectasis EXAM: PORTABLE CHEST 1 VIEW COMPARISON:  S8 01/07/2017 mean FINDINGS: NG tube has been exchanged for a feeding tube which extends below the hemidiaphragms into the stomach. Single lead left subclavian pacemaker/ defibrillator. Similar cardiomegaly with mild interstitial edema pattern and basilar atelectasis. No enlarging effusion or pneumothorax. No new focal collapse or consolidation.  IMPRESSION: Stable cardiomegaly with mild interstitial edema pattern No significant interval change. Electronically Signed   By: Jerilynn Mages.  Shick M.D.   On: 12/19/2016 13:53   Scheduled Meds: . aspirin  81 mg Per Tube Daily  . chlorhexidine gluconate (MEDLINE KIT)  15 mL Mouth Rinse BID  . Chlorhexidine Gluconate Cloth  6 each Topical Daily  . feeding supplement (PRO-STAT SUGAR FREE 64)  30 mL Per Tube BID  . free water  200 mL Per Tube Q3H  . insulin aspart  0-20 Units Subcutaneous Q4H  . mouth rinse  15 mL Mouth Rinse QID  . metoprolol tartrate  100 mg Per Tube BID  . pantoprazole sodium  40 mg Per Tube BID  . rivaroxaban  20 mg Per Tube Daily  . sodium chloride flush  10-40 mL Intracatheter Q12H   Continuous Infusions: . sodium chloride Stopped (12/14/16 0953)  . sodium chloride 500 mL (12/03/16 0126)  . feeding supplement (VITAL 1.5 CAL) 1,000 mL (12/20/16 0825)    LOS: 24 days   Time spent in minutes:29  Irwin Brakeman, MD [facility] Pager: [phone] [email or web address] Password [facility] 12/21/2016, 7:34 AM

## 2016-12-21 NOTE — Progress Notes (Addendum)
  Speech Language Pathology  Patient Details Name: Orwin Alonzo MRN: [id number] DOB: [birth year] Today's Date: 12/21/2016 Time:  -       Chart review re: pt's status for possible improvements with swallow ability and repeat MBS however now with possible wound dehiscence, having abdominal CT with contrast. Will continue work with pt   Royce Macadamia 12/21/2016, 8:55 AM  Breck Coons Lonell Face.Ed [employer] [phone]

## 2016-12-21 NOTE — Progress Notes (Signed)
Patient's daughter notified about patient transfer to [facility]M. Elnita Maxwell, RN

## 2016-12-21 NOTE — Progress Notes (Signed)
IV Team note:  RT arm is restricted due to prior DVT and thrombectomy.  Attempted PICC via LT brachial vein.  Accessed vein successfully however was unable to thread the PICC beyond the AICD.  The left arm is swollen with pitting edema from mid upper arm to wrist.  I was informed that there was an infiltrate of IV dye from a CT scan performed earlier today.   Primary RN made aware as pt is getting ready to transfer to the stepdown unit .

## 2016-12-21 NOTE — Progress Notes (Signed)
12/21/2016 3:42 PM  I called and updated patient's daughter Vikki Ports about CT scan results. She asked that patient's girlfriend not be updated on patient's medical condition at this time.  She is planning to come and see patient in the morning before drain is placed.   Maryln Manuel MD

## 2016-12-21 NOTE — Care Management Important Message (Signed)
Important Message  Patient Details  Name: Jacob Pittman MRN: [id number] Date of Birth: 08/09/1945   Medicare Important Message Given:  Yes    [name] 12/21/2016, 11:10 AM

## 2016-12-21 NOTE — Progress Notes (Signed)
12/21/2016 12:33 PM  I called and updated patient's daughter about the situation.  She verbalized understanding.  I will try to keep them up to date. CT still pending.  Maryln Manuel MD

## 2016-12-21 NOTE — Progress Notes (Signed)
Peripherally Inserted Central Catheter/Midline Placement  The IV Nurse has discussed with the patient and/or persons authorized to consent for the patient, the purpose of this procedure and the potential benefits and risks involved with this procedure.  The benefits include less needle sticks, lab draws from the catheter, and the patient may be discharged home with the catheter. Risks include, but not limited to, infection, bleeding, blood clot (thrombus formation), and puncture of an artery; nerve damage and irregular heartbeat and possibility to perform a PICC exchange if needed/ordered by physician.  Alternatives to this procedure were also discussed.  Bard Power PICC patient education guide, fact sheet on infection prevention and patient information card has been provided to patient /or left at bedside.    PICC/Midline Placement Documentation        Jacob Pittman 12/21/2016, 6:41 PM Phone consent obtained from daughter Jacob Pittman

## 2016-12-21 NOTE — Progress Notes (Signed)
Physical Therapy Treatment Patient Details Name: Jacob Pittman MRN: [id number] DOB: [birth year] Today's Date: 12/21/2016    History of Present Illness Patient is a 71 yo male admitted 11/26/16 with perforated duodenal bulb ulcer, now s/p exploratory lap and repair on 11/27/16. Postoperatively remained in shock requiring epinephrine and Levophed drip. Also complicated by ischemic RUE and is s/p embolectomy.  Patient was slow to wean from vent, extubated on 12/10/16.  Patient with confusion.     PMH:   ICM, DM, HTN, PAD, OSA, CHF, EF 15%, ICD, PAF, OA    PT Comments    Therapist checked with RN prior to session and pt cleared for participation. Patient was more alert and following commands today. Pt able to maintain sitting balance with min guard-max A. This session focused on sitting balance and pt returned to supine. Drainage at abdominal site noted end of session and RN notified.   Follow Up Recommendations  SNF;Supervision/Assistance - 24 hour     Equipment Recommendations  Other (comment) (TBD)    Recommendations for Other Services       Precautions / Restrictions Precautions Precautions: Fall Precaution Comments: NG suction; feeding tube Restrictions Weight Bearing Restrictions: No    Mobility  Bed Mobility Overal bed mobility: Needs Assistance Bed Mobility: Supine to Sit     Supine to sit: Max assist;+2 for physical assistance Sit to supine: Max assist;+2 for physical assistance   General bed mobility comments: cues for sequencing; use of bed pad to scoot hips and assist to bring bilat LE to EOB and elevate trunk into sitting; pt assisted more this session and able to bring bilat LE most of the way to EOB  Transfers                    Ambulation/Gait                 Stairs            Wheelchair Mobility    Modified Rankin (Stroke Patients Only)       Balance Overall balance assessment: Needs assistance Sitting-balance support: Bilateral  upper extremity supported;Feet supported Sitting balance-Leahy Scale: Poor Sitting balance - Comments: pt was able to maintain sitting balance intially with mod/max A and then progresses to min guard with multimodal cues for coming to midline; worked on reaching outside BOS  Postural control: Right lateral lean;Posterior lean                                  Cognition Arousal/Alertness: Awake/alert Behavior During Therapy: WFL for tasks assessed/performed Overall Cognitive Status: No family/caregiver present to determine baseline cognitive functioning Area of Impairment: Following commands;Problem solving                       Following Commands: Follows one step commands with increased time;Follows multi-step commands inconsistently     Problem Solving: Slow processing;Decreased initiation;Requires verbal cues;Requires tactile cues General Comments: pt communicated more and answered questions appropriately; slow to process but more alert today      Exercises General Exercises - Lower Extremity Long Arc Quad: AROM;Both;5 reps;Seated    General Comments General comments (skin integrity, edema, etc.): pt with drainage from abdominal wound upon lying supine--RN notified       Pertinent Vitals/Pain Pain Assessment: No/denies pain Pain Intervention(s): Monitored during session    Home Living  Prior Function            PT Goals (current goals can now be found in the care plan section) Acute Rehab PT Goals PT Goal Formulation: Patient unable to participate in goal setting Time For Goal Achievement: 12/26/16 Potential to Achieve Goals: Fair Progress towards PT goals: Progressing toward goals    Frequency    Min 3X/week      PT Plan Current plan remains appropriate    Co-evaluation              AM-PAC PT "6 Clicks" Daily Activity  Outcome Measure  Difficulty turning over in bed (including adjusting  bedclothes, sheets and blankets)?: Unable Difficulty moving from lying on back to sitting on the side of the bed? : Unable Difficulty sitting down on and standing up from a chair with arms (e.g., wheelchair, bedside commode, etc,.)?: Unable Help needed moving to and from a bed to chair (including a wheelchair)?: Total Help needed walking in hospital room?: Total Help needed climbing 3-5 steps with a railing? : Total 6 Click Score: 6    End of Session   Activity Tolerance: Patient tolerated treatment well Patient left: with call bell/phone within reach;in bed;with bed alarm set Nurse Communication: Mobility status PT Visit Diagnosis: Muscle weakness (generalized) (M62.81);Difficulty in walking, not elsewhere classified (R26.2)     Time: 4098-1191 PT Time Calculation (min) (ACUTE ONLY): 27 min  Charges:  $Therapeutic Activity: 23-37 mins                    G Codes:       Erline Levine, PTA Pager: [phone]     Carolynne Edouard 12/21/2016, 4:53 PM

## 2016-12-21 NOTE — Progress Notes (Addendum)
Progress Note  Patient Name: Jacob Pittman Date of Encounter: 12/21/2016  Primary Cardiologist: Dr Martinique Primary EP: Lovena Le  Subjective   He has become progressively ill and less interactive.  Concerns for wound dehiscence is noted.  Denies CP or SOB.  No further ICD shocks.  Inpatient Medications    Scheduled Meds: . aspirin  81 mg Per Tube Daily  . chlorhexidine gluconate (MEDLINE KIT)  15 mL Mouth Rinse BID  . Chlorhexidine Gluconate Cloth  6 each Topical Daily  . feeding supplement (PRO-STAT SUGAR FREE 64)  30 mL Per Tube BID  . free water  200 mL Per Tube Q3H  . insulin aspart  0-20 Units Subcutaneous Q4H  . mouth rinse  15 mL Mouth Rinse QID  . metoprolol tartrate  100 mg Per Tube BID  . pantoprazole sodium  40 mg Per Tube BID  . rivaroxaban  20 mg Per Tube Daily  . sodium chloride flush  10-40 mL Intracatheter Q12H   Continuous Infusions: . sodium chloride Stopped (12/14/16 0953)  . sodium chloride 500 mL (12/03/16 0126)  . feeding supplement (VITAL 1.5 CAL) 1,000 mL (12/20/16 0825)   PRN Meds: sodium chloride, acetaminophen (TYLENOL) oral liquid 160 mg/5 mL, artificial tears, ondansetron **OR** ondansetron (ZOFRAN) IV, sodium chloride flush   Vital Signs    Vitals:   12/20/16 2025 12/20/16 2354 12/21/16 0602 12/21/16 0754  BP: (!) 93/59 (!) 102/48 (!) 105/47 (!) 108/55  Pulse: 88 93 85 90  Resp: 18 (!) 22 18 (!) 22  Temp: 98.7 F (37.1 C) 99.5 F (37.5 C) 98.6 F (37 C) 98.5 F (36.9 C)  TempSrc: Oral Oral Oral Oral  SpO2: 98% 97% 99% 100%  Weight:   171 lb 14.4 oz (78 kg)   Height:        Intake/Output Summary (Last 24 hours) at 12/21/16 0807 Last data filed at 12/21/16 0500  Gross per 24 hour  Intake                0 ml  Output             1101 ml  Net            -1101 ml   Filed Weights   12/19/16 0606 12/20/16 0617 12/21/16 0602  Weight: 187 lb 9.6 oz (85.1 kg) 189 lb 6.4 oz (85.9 kg) 171 lb 14.4 oz (78 kg)    Telemetry    Sinus  rhythm with frequent atrial and ventricular ectopy - Personally Reviewed  Physical Exam   GEN- The patient is ill appearing, alert but fatigued appearing not very interactive Head- normocephalic, atraumatic Eyes-  Sclera clear, conjunctiva pink Ears- hearing intact Oropharynx- clear Neck- supple, Lungs- rapid shallow breathing, decreased BS Heart- Regular rate and rhythm  GI- distended Extremities- no clubbing, cyanosis, + dependant edema MS- diffuse muscle atrophy Skin- no rash or lesion Psych- euthymic mood, full affect Neuro- strength and sensation are intact   Labs    Chemistry Recent Labs Lab 12/17/16 0427  12/19/16 0638 12/20/16 0540 12/21/16 0421  NA 157*  < > 147* 149* 147*  K 3.7  < > 3.8 3.9 3.7  CL 124*  < > 118* 118* 117*  CO2 24  < > 21* 22 22  GLUCOSE 155*  < > 175* 175* 195*  BUN 33*  < > 30* 32* 36*  CREATININE 0.87  < > 0.88 0.91 0.94  CALCIUM 8.0*  < > 7.6* 7.5* 7.5*  PROT 5.9*  --   --  6.0* 5.8*  ALBUMIN 1.8*  --   --  1.7* 1.6*  AST 65*  --   --  46* 201*  ALT 47  --   --  48 96*  ALKPHOS 60  --   --  64 155*  BILITOT 0.7  --   --  0.6 0.8  GFRNONAA >60  < > >60 >60 >60  GFRAA >60  < > >60 >60 >60  ANIONGAP 9  < > '8 9 8  '$ < > = values in this interval not displayed.   Hematology Recent Labs Lab 12/18/16 0207 12/19/16 0638 12/20/16 0540  WBC 5.5 7.1 9.2  RBC 2.80* 2.99* 2.96*  HGB 9.3* 9.9* 9.6*  HCT 30.7* 31.8* 31.1*  MCV 109.6* 106.4* 105.1*  MCH 33.2 33.1 32.4  MCHC 30.3 31.1 30.9  RDW 15.3 15.3 15.3  PLT 246 244 241    Cardiac EnzymesNo results for input(s): TROPONINI in the last 168 hours. No results for input(s): TROPIPOC in the last 168 hours.     Patient Profile   71 y.o. male with nonischemic CM and prior ICD now with sustained VT and ICD shock in the setting of advanced medical illness.  Assessment & Plan    1. Polymorphic VT Reasonably stable No further ICD shocks Continue metoprolol Likely exacerbated by  underlying medical condition  2. Nonischemic CM Would keep Is and Os slightly negative  3. 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 Possible wound dehiscence Dr Georgette Dover and I have discussed.  We agree that the patient is very high risk for any repeat procedures.  A conservative approach is best.  Prognosis is poor I agree with Dr Durenda Age assessment that code status change would be appropriate I would advise palliative care consultation if the family is willing If palliative options are pursued, we should think about turning ICD shock therapies off.  EP team can assist with this.  Electrophysiology team to see as needed while here. Please call us for any sustained ventricular arrhythmias or ICD shocks Please call with questions.   Thompson Grayer MD, [facility] 12/21/2016 8:07 AM

## 2016-12-21 NOTE — Procedures (Signed)
Central Venous Catheter Insertion Procedure Note Laeton Rei [id number] 1946/04/17  Procedure: Insertion of Central Venous Catheter Indications: Assessment of intravascular volume, Drug and/or fluid administration and Frequent blood sampling  Procedure Details Consent: Risks of procedure as well as the alternatives and risks of each were explained to the (patient/caregiver).  Consent for procedure obtained. Time Out: Verified patient identification, verified procedure, site/side was marked, verified correct patient position, special equipment/implants available, medications/allergies/relevent history reviewed, required imaging and test results available.  Performed  Maximum sterile technique was used including antiseptics, cap, gloves, gown, hand hygiene, mask and sheet. Skin prep: Chlorhexidine; local anesthetic administered A antimicrobial bonded/coated triple lumen catheter was placed in the left internal jugular vein using the Seldinger technique. Ultrasound guidance used.Yes.   Catheter placed to 20 cm. Blood aspirated via all 3 ports and then flushed x 3. Line sutured x 2 and dressing applied.  Evaluation Blood flow good Complications: No apparent complications Patient did tolerate procedure well. Chest X-ray ordered to verify placement.  CXR: pending.  Joneen Roach, AGACNP-BC [facility] Pulmonology/Critical Care Pager [phone] or [phone]  12/21/2016 9:01 PM

## 2016-12-21 NOTE — Progress Notes (Signed)
21 Days Post-Op   Subjective/Chief Complaint: Patient was PRN for the weekend, but called to see patient this morning for "stool coming from wound".  Apparently this is new over the last day, since previous notes stated that the wound was clean.  Patient does not appear to be in any distress.  Nurses tell me that the "stool" is coming from the lower end of the incision.  Patient is having bowel movements per rectum.  Tube feeds at 55 ml/hr   Objective: Vital signs in last 24 hours: Temp:  [98.6 F (37 C)-100.6 F (38.1 C)] 98.6 F (37 C) (09/03 0602) Pulse Rate:  [70-94] 85 (09/03 0602) Resp:  [18-22] 18 (09/03 0602) BP: (93-128)/(47-59) 105/47 (09/03 0602) SpO2:  [97 %-100 %] 99 % (09/03 0602) Weight:  [78 kg (171 lb 14.4 oz)] 78 kg (171 lb 14.4 oz) (09/03 0602) Last BM Date: 12/19/16  Intake/Output from previous day: 09/02 0701 - 09/03 0700 In: -  Out: 1101 [Urine:1100; Stool:1] Intake/Output this shift: No intake/output data recorded.  General appearance: cooperative, no distress and answers questions slowly GI: soft, minimal tenderness; no peritonitis Wound - sides are clean and beginning to granulate  Fascial sutures visible - loose Bottom 3 cm of wound - apparent fascial dehiscence with some stool/ bilious staining.  Dressing was just changed, so not able to visualize any output at this time.  Lab Results:   Recent Labs  12/19/16 0638 12/20/16 0540  WBC 7.1 9.2  HGB 9.9* 9.6*  HCT 31.8* 31.1*  PLT 244 241   BMET  Recent Labs  12/20/16 0540 12/21/16 0421  NA 149* 147*  K 3.9 3.7  CL 118* 117*  CO2 22 22  GLUCOSE 175* 195*  BUN 32* 36*  CREATININE 0.91 0.94  CALCIUM 7.5* 7.5*   PT/INR No results for input(s): LABPROT, INR in the last 72 hours. ABG No results for input(s): PHART, HCO3 in the last 72 hours.  Invalid input(s): PCO2, PO2  Studies/Results: Dg Chest Port 1 View  Result Date: 12/19/2016 CLINICAL DATA:  Atelectasis EXAM: PORTABLE CHEST  1 VIEW COMPARISON:  S8 01/07/2017 mean FINDINGS: NG tube has been exchanged for a feeding tube which extends below the hemidiaphragms into the stomach. Single lead left subclavian pacemaker/ defibrillator. Similar cardiomegaly with mild interstitial edema pattern and basilar atelectasis. No enlarging effusion or pneumothorax. No new focal collapse or consolidation. IMPRESSION: Stable cardiomegaly with mild interstitial edema pattern No significant interval change. Electronically Signed   By: Judie Petit.  Shick M.D.   On: 12/19/2016 13:53    Anti-infectives: Anti-infectives    Start     Dose/Rate Route Frequency Ordered Stop   12/13/16 1000  amoxicillin (AMOXIL) 250 MG/5ML suspension 1,000 mg     1,000 mg Per Tube Every 12 hours 12/13/16 0935 12/16/16 1833   12/12/16 1900  piperacillin-tazobactam (ZOSYN) IVPB 3.375 g  Status:  Discontinued     3.375 g 12.5 mL/hr over 240 Minutes Intravenous Every 8 hours 12/12/16 1759 12/13/16 0911   12/12/16 1400  Ampicillin-Sulbactam (UNASYN) 3 g in sodium chloride 0.9 % 100 mL IVPB  Status:  Discontinued     3 g 200 mL/hr over 30 Minutes Intravenous Every 6 hours 12/12/16 1215 12/12/16 1753   12/10/16 0600  amoxicillin (AMOXIL) 250 MG/5ML suspension 1,000 mg  Status:  Discontinued     1,000 mg Per Tube Every 12 hours 12/09/16 1701 12/12/16 1206   12/09/16 1130  amoxicillin (AMOXIL) 250 MG/5ML suspension 1,000 mg  Status:  Discontinued     1,000 mg Oral Every 12 hours 12/09/16 1117 12/09/16 1118   12/09/16 1130  amoxicillin (AMOXIL) 250 MG/5ML suspension 1,000 mg  Status:  Discontinued     1,000 mg Per Tube Every 12 hours 12/09/16 1118 12/09/16 1701   12/04/16 2200  clarithromycin (BIAXIN) tablet 500 mg     500 mg Per Tube Every 12 hours 12/04/16 1107 12/16/16 2158   12/04/16 2000  piperacillin-tazobactam (ZOSYN) IVPB 3.375 g     3.375 g 12.5 mL/hr over 240 Minutes Intravenous Every 8 hours 12/04/16 1339 12/08/16 2359   12/04/16 1600  metroNIDAZOLE (FLAGYL) tablet  500 mg  Status:  Discontinued     500 mg Per Tube Every 8 hours 12/04/16 1107 12/09/16 1117   12/02/16 1100  clarithromycin (BIAXIN) 250 MG/5ML suspension 500 mg  Status:  Discontinued     500 mg Per Tube Every 12 hours 12/02/16 1012 12/04/16 1107   12/02/16 1045  metroNIDAZOLE (FLAGYL) 50 mg/ml oral suspension 500 mg  Status:  Discontinued     500 mg Per Tube 3 times daily 12/02/16 1036 12/04/16 1107   12/02/16 1015  metroNIDAZOLE (FLAGYL) 50 mg/ml oral suspension 250 mg  Status:  Discontinued     250 mg Per Tube 4 times daily 12/02/16 1011 12/02/16 1036   11/28/16 0800  fluconazole (DIFLUCAN) IVPB 200 mg  Status:  Discontinued     200 mg 100 mL/hr over 60 Minutes Intravenous Every 24 hours 11/27/16 0624 12/02/16 1018   11/27/16 0630  fluconazole (DIFLUCAN) IVPB 400 mg     400 mg 100 mL/hr over 120 Minutes Intravenous  Once 11/27/16 0622 11/27/16 0841   11/27/16 0600  piperacillin-tazobactam (ZOSYN) IVPB 3.375 g  Status:  Discontinued     3.375 g 12.5 mL/hr over 240 Minutes Intravenous Every 8 hours 11/27/16 0534 12/04/16 1339   11/27/16 0515  piperacillin-tazobactam (ZOSYN) IVPB 3.375 g  Status:  Discontinued     3.375 g 100 mL/hr over 30 Minutes Intravenous  Once 11/27/16 0510 11/27/16 0518   11/27/16 0030  piperacillin-tazobactam (ZOSYN) IVPB 3.375 g     3.375 g 100 mL/hr over 30 Minutes Intravenous  Once 11/27/16 0016 11/27/16 0755   11/27/16 0030  vancomycin (VANCOCIN) IVPB 1000 mg/200 mL premix     1,000 mg 200 mL/hr over 60 Minutes Intravenous  Once 11/27/16 0016 11/27/16 0756      Assessment/Plan: Perforated pyloric ulcer  S/P EXPLORATORY LAPAROTOMY, GRAHAM PATCH REPAIR8/10/18 Dr. 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 Darrick Penna ? CVA Hypernatremia/hyperchloremia - Free water 200 ml q8h Dysphagia Type II diabetes Acute renal failure - resolving FEN: IV fluids/npo/tube feedings ID:  zosyn 8/9- 12/08/16,  Diflucan 8/9-8/15/18, clarithromycin 8/17 - 8/29  Amoxicillin 8/23-29/18  =>> day 15 of antibiotics  DVT: SCD/heparin =>> Xarelto per TF  Plan:  Hold tube feeds - may need maintenance IV fluids to avoid dehydration CT abdomen/ pelvis today to evaluate abdomen/ wound. Patient is not a good candidate for any further surgery.  Severe comorbidities, protein-calorie malnutrition, Xarelto. If he has a fistula, we will have to manage with local wound care, possible TNA   LOS: 24 days    Jacob Ackers K. 12/21/2016

## 2016-12-21 NOTE — Progress Notes (Signed)
Patient ID: Jacob Pittman, male   DOB: January 27, 1946, 71 y.o.   MRN: [id number] CT reviewed. He has an intra-abdominal abscess draining spontaneously via his wound. Continue wound care. May need IR eval for drain in AM. Start Zosyn IV. Violeta Gelinas, MD, MPH, FACS Trauma: [phone] General Surgery: [phone]

## 2016-12-21 NOTE — Progress Notes (Signed)
Pt received from [facility] per bed 1915 CHG bath done

## 2016-12-21 NOTE — Progress Notes (Signed)
Pt Abdomen wound is leaking brown bowel through out the night. Rounding with GI Md. Order to turn off tube feeding and plan to obtain a CT scan of abd. Pelvis.

## 2016-12-22 ENCOUNTER — Inpatient Hospital Stay (HOSPITAL_COMMUNITY): Payer: Medicare PPO

## 2016-12-22 LAB — COMPREHENSIVE METABOLIC PANEL
ALBUMIN: 1.6 g/dL — AB (ref 3.5–5.0)
ALK PHOS: 156 U/L — AB (ref 38–126)
ALT: 125 U/L — ABNORMAL HIGH (ref 17–63)
ANION GAP: 8 (ref 5–15)
AST: 149 U/L — ABNORMAL HIGH (ref 15–41)
BILIRUBIN TOTAL: 0.7 mg/dL (ref 0.3–1.2)
BUN: 36 mg/dL — AB (ref 6–20)
CO2: 24 mmol/L (ref 22–32)
Calcium: 7.5 mg/dL — ABNORMAL LOW (ref 8.9–10.3)
Chloride: 115 mmol/L — ABNORMAL HIGH (ref 101–111)
Creatinine, Ser: 1.02 mg/dL (ref 0.61–1.24)
GFR calc Af Amer: >60 mL/min (ref >60)
GFR calc non Af Amer: >60 mL/min (ref >60)
GLUCOSE: 107 mg/dL — AB (ref 65–99)
POTASSIUM: 4.1 mmol/L (ref 3.5–5.1)
SODIUM: 147 mmol/L — AB (ref 135–145)
Total Protein: 6 g/dL — ABNORMAL LOW (ref 6.5–8.1)

## 2016-12-22 LAB — BLOOD CULTURE ID PANEL (REFLEXED)
ACINETOBACTER BAUMANNII: NOT DETECTED
CANDIDA ALBICANS: NOT DETECTED
CANDIDA KRUSEI: NOT DETECTED
CANDIDA PARAPSILOSIS: NOT DETECTED
CANDIDA TROPICALIS: NOT DETECTED
Candida glabrata: NOT DETECTED
ENTEROBACTERIACEAE SPECIES: NOT DETECTED
ESCHERICHIA COLI: NOT DETECTED
Enterobacter cloacae complex: NOT DETECTED
Enterococcus species: NOT DETECTED
Haemophilus influenzae: NOT DETECTED
KLEBSIELLA OXYTOCA: NOT DETECTED
KLEBSIELLA PNEUMONIAE: NOT DETECTED
Listeria monocytogenes: NOT DETECTED
METHICILLIN RESISTANCE: DETECTED — AB
Neisseria meningitidis: NOT DETECTED
PSEUDOMONAS AERUGINOSA: NOT DETECTED
Proteus species: NOT DETECTED
SERRATIA MARCESCENS: NOT DETECTED
STAPHYLOCOCCUS AUREUS BCID: NOT DETECTED
STREPTOCOCCUS AGALACTIAE: NOT DETECTED
STREPTOCOCCUS PNEUMONIAE: NOT DETECTED
STREPTOCOCCUS SPECIES: NOT DETECTED
Staphylococcus species: DETECTED — AB
Streptococcus pyogenes: NOT DETECTED

## 2016-12-22 LAB — GLUCOSE, CAPILLARY
GLUCOSE-CAPILLARY: 98 mg/dL (ref 65–99)
GLUCOSE-CAPILLARY: 101 mg/dL — AB (ref 65–99)
GLUCOSE-CAPILLARY: 102 mg/dL — AB (ref 65–99)
Glucose-Capillary: 122 mg/dL — ABNORMAL HIGH (ref 65–99)
Glucose-Capillary: 123 mg/dL — ABNORMAL HIGH (ref 65–99)
Glucose-Capillary: 87 mg/dL (ref 65–99)
Glucose-Capillary: 91 mg/dL (ref 65–99)

## 2016-12-22 LAB — CBC
HEMATOCRIT: 27.4 % — AB (ref 39.0–52.0)
HEMOGLOBIN: 8.4 g/dL — AB (ref 13.0–17.0)
MCH: 32.4 pg (ref 26.0–34.0)
MCHC: 30.7 g/dL (ref 30.0–36.0)
MCV: 105.8 fL — ABNORMAL HIGH (ref 78.0–100.0)
Platelets: 246 10*3/uL (ref 150–400)
RBC: 2.59 MIL/uL — ABNORMAL LOW (ref 4.22–5.81)
RDW: 15.1 % (ref 11.5–15.5)
WBC: 7.1 10*3/uL (ref 4.0–10.5)

## 2016-12-22 LAB — SURGICAL PCR SCREEN
MRSA, PCR: NEGATIVE
STAPHYLOCOCCUS AUREUS: NEGATIVE

## 2016-12-22 MED ORDER — RIVAROXABAN 20 MG PO TABS
20.0000 mg | ORAL_TABLET | Freq: Every day | ORAL | Status: DC
  Filled 2016-12-22: qty 1

## 2016-12-22 MED ORDER — DEXTROSE 5 % IV SOLN
INTRAVENOUS | Status: AC
  Administered 2016-12-23: via INTRAVENOUS

## 2016-12-22 MED ORDER — METOPROLOL TARTRATE 25 MG/10 ML ORAL SUSPENSION
50.0000 mg | Freq: Two times a day (BID) | ORAL | Status: DC
  Administered 2016-12-23: 50 mg
  Filled 2016-12-22 (×2): qty 20

## 2016-12-22 NOTE — Progress Notes (Signed)
12/22/2016 2:29 PM  I spoke with patient's oldest daughter Joni Reining and udpated her on patient's condition.  She says that the family is leaning to comfort care and she would really appreciate a palliative medicine consult.  I discussed code status with her and she agrees with DNR.  She would like to have the abscess drained if possible and continue IV antibiotics for now.    Maryln Manuel MD

## 2016-12-22 NOTE — Progress Notes (Signed)
PT Cancellation Note  Patient Details Name: Jacob Pittman MRN: [id number] DOB: [birth year]   Cancelled Treatment:    Reason Eval/Treat Not Completed: Medical issues which prohibited therapy   Angelina Ok [facility] 12/22/2016, 2:32 PM [facility] PT [phone]

## 2016-12-22 NOTE — Progress Notes (Signed)
Right arm incision continues to heal Left arm remains swollen duplex last week no DVT Will sign off Call if questions  Fabienne Bruns, MD [facility] Office: [phone] Pager: [phone]

## 2016-12-22 NOTE — Care Management Important Message (Signed)
Important Message  Patient Details  Name: Jacob Pittman MRN: [id number] Date of Birth: Dec 25, 1945   Medicare Important Message Given:  Yes    Kyla Balzarine 12/22/2016, 10:57 AM

## 2016-12-22 NOTE — Progress Notes (Signed)
SLP Cancellation Note  Patient Details Name: Jacob Pittman MRN: [id number] DOB: 12/18/1945   Cancelled treatment:       Reason Eval/Treat Not Completed: Fatigue/lethargy limiting ability to participate; RR in 40s. Will follow.   Blenda Mounts Laurice 12/22/2016, 10:04 AM

## 2016-12-22 NOTE — Progress Notes (Signed)
12/22/2016 2:11 PM  I made multiple attempts to contact daughter Vikki Ports but no answer.  I got through to [facility] and she said that she would try to find her and get her to call us back about code status.    Maryln Manuel MD

## 2016-12-22 NOTE — Progress Notes (Signed)
22 Days Post-Op    CC:  Perforated ulcer  Subjective: He is pretty tired not talking again today.  His wound looks terrible, picture is below.  The necrosis at the base is all new and was not there Friday 8/31, the wound was cleaning up at that time.  He also has some clear drainage at the base now and I am concerned this is peritoneal fluid.  BP and respiratory rates are worsening also.    Temp up to 102.6 at 11 PM.  Objective: Vital signs in last 24 hours: Temp:  [98.3 F (36.8 C)-99.6 F (37.6 C)] 99.6 F (37.6 C) (09/04 0333) Pulse Rate:  [46-99] 99 (09/04 0700) Resp:  [22-47] 45 (09/04 0700) BP: (81-108)/(22-63) 81/40 (09/04 0700) SpO2:  [97 %-100 %] 97 % (09/04 0700) Weight:  [88.7 kg (195 lb 8.8 oz)] 88.7 kg (195 lb 8.8 oz) (09/04 0500) Last BM Date: 12/21/16 1400 NG in  200  IV Urine 100 recorded Afebrile, SR/SVT , BP down some Na 147 still improving LFT's up  WBC 7.1 H/H 8.4/27 CXR today shows worsening infection or edema/small effusions CT scan yesterday:  340 cc abscess along sigmoid, tracking and draining out of the wound.? Fistula Intake/Output from previous day: 09/03 0701 - 09/04 0700 In: 1653.3 [I.V.:153.3; NG/GT:400; IV Piggyback:100] Out: 100 [Urine:100] Intake/Output this shift: Total I/O In: 10 [I.V.:10] Out: -   General appearance: not really looking around or talking this AM.   Resp: clear to auscultation bilaterally and anterior GI: soft, malodorus abdominal wound with new necrosis of the lower wound that was not present on 12/18/16.  Clear fluid coming from the base also.      Lab Results:   Recent Labs  12/21/16 2103 12/22/16 0514  WBC 7.3 7.1  HGB 8.4* 8.4*  HCT 27.3* 27.4*  PLT 247 246    BMET  Recent Labs  12/21/16 2103 12/22/16 0514  NA 144 147*  K 4.1 4.1  CL 113* 115*  CO2 24 24  GLUCOSE 100* 107*  BUN 33* 36*  CREATININE 0.94 1.02  CALCIUM 7.4* 7.5*   PT/INR No results for input(s): LABPROT, INR in the last 72  hours.   Recent Labs Lab 12/17/16 0427 12/20/16 0540 12/21/16 0421 12/22/16 0514  AST 65* 46* 201* 149*  ALT 47 48 96* 125*  ALKPHOS 60 64 155* 156*  BILITOT 0.7 0.6 0.8 0.7  PROT 5.9* 6.0* 5.8* 6.0*  ALBUMIN 1.8* 1.7* 1.6* 1.6*     Lipase     Component Value Date/Time   LIPASE 20 12/01/2016 0304     Medications: . aspirin  81 mg Per Tube Daily  . chlorhexidine   Topical Once  . chlorhexidine gluconate (MEDLINE KIT)  15 mL Mouth Rinse BID  . Chlorhexidine Gluconate Cloth  6 each Topical Daily  . feeding supplement (PRO-STAT SUGAR FREE 64)  30 mL Per Tube BID  . free water  200 mL Per Tube Q3H  . insulin aspart  0-20 Units Subcutaneous Q4H  . lidocaine (PF)  5 mL Intradermal Once  . mouth rinse  15 mL Mouth Rinse QID  . metoprolol tartrate  50 mg Per Tube BID  . pantoprazole sodium  40 mg Per Tube BID  . rivaroxaban  20 mg Per Tube Daily  . sodium chloride flush  10-40 mL Intracatheter Q12H    Assessment/Plan Perforated pyloric ulcer  S/P EXPLORATORY LAPAROTOMY, GRAHAM PATCH REPAIR8/10/18 Dr. 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 Fields ? CVA Hypernatremia/hyperchloremia - Free water 200 ml q8h Dysphagia Type II diabetes Acute renal failure - resolving FEN: IV fluids/npo/tube feedings ID:  zosyn 8/9- 12/08/16, Diflucan 8/9-8/15/18, clarithromycin 8/17 - 8/29 Amoxicillin 8/23-29/18 =>>day 15 of antibiotics  DVT: SCD/heparin =>>Xarelto per TF last dose last PM   Plan:  TF is off, he has a new central line.  We have ask IR to see about draining abscess.  He had Xarelto yesterday PM.  I would keep him NPO,  consider TNA, Heparin drip instead of Xarelto. I ask for a binder to help with protecting lower abdominal wound.  I am worried it may really dehisce, with the degree of the necrosis that is currently visible with the lower wound.  italked with the daughters and said this looks like a very bad turn.  They know he has been very sick and this is a reversal of his course from last week.     LOS: 25 days    Pittman,Jacob 12/22/2016 [phone]  

## 2016-12-22 NOTE — Progress Notes (Signed)
PROGRESS NOTE  Jacob Pittman   XTG:[id number]  DOB: 1946-04-09  DOA: 11/26/2016 PCP: Biagio Borg, MD   Brief Narrative:  Jacob Pittman 71 y/o male with EF 15% (ischemic CMP) s/p AICD, HTN, DM, PAD presented on 8/9 with abdominal pain, LA 9.4 and a bowel perforation. Was found to have a perforated pyloric channel ulcer and pneumoperitonitis s/p omental patch repair subsequently on epinephrine and Levophed.  8/12-  TTE LV moderately dilated with EF 15% &diffuse hypokinesis. There is akinesis of the inferolateral and inferior myocardium.  E coli UTI noted 8/13-  right brachial A- line placed and then developed ischemic right arm- vascular consult and transfer from [facility] to [facility]- thrombectomy of brachial artery- heparin Paroxysmal A-fib also noted 8/14- Decreased attenuation involving the right dentate nucleus in the right cerebellum and immediately adjacent cerebellar parenchyma, concerning for recent and potentially acute infarct in this area.  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- anticoagulation recommended to be stopped for 10-14 days - H pyloli + started on triple therapy - Transaminitis thought to be du to Diflucan hepatotoxicity 8/17 - febrile again and vasopressors resumed 8/23- extubated 8/25 A-fib with HR up to 160s, fever 101.6 at 4 AM- care transferred to [facility] 9/3 - stool noted to be coming out of wound  Subjective: Pt awake, denies complaints, foul smell in room coming from the abdominal wound   Assessment & Plan:   Perforated duodenal bulb ulcer/ peritonitis/   H pylori ulcer Septic shock  Vancomycin 8/10 (x1 dose) Diflucan 8/10 - 8/15 Zosyn 8/10 > 8/21  Flagyl 8/15 > 8/22  Biaxin 8/15 >>  Amoxicillin 8/22 >> on 8/25 >>Unasyn>>Zosyn - BID PPI-  - 8/25  spiking fevers up to 103- cultures negative-  changed amoxil to Unasyn and then Zosyn - central line removed and fevers resolved - 8/26- changed Zosyn back to Amoxil  - 8/27 - no  recurrence of fever- CT abd/pelvis and CXR ordered by gen surgery as they felt he may have another source but I still feel the source was the central line. - 8/28 - temp 99, a bit congested by not in resp distress or hypoxic. As NG got dislodged while tube feeds were running, will need to follow for symptoms of aspiration pneumonia- no change in antibiotics today. - per SLP, still needs to by NPO and will need to cont feeding per NG. Cortrak has been placed.   - Contractor, asked social worker to eval for SNF   - 9/3 - stool noted to be coming out of abdominal wound and LFTs spiked, asked general surgery to come and re-evaluate - 9/3 - central venous line placed for IV access, started Zosyn IV - 9/4 - pending drain placement to abdominal abscess with fluid leaking from abdominal wound   Paroxysmal A-fib   - frequent PVFs, vtach, torsades - increased metoprolol to 50 mg BID 9/1 - I called cardiology and asked them to re-evaluate, see new orders - 8/26- Changed Heparin to Xarelto   - replacing K, Mg as needed, follow closely  B/l basilar atelectasis -pt not using incentive spirometry  Hypernatremia  increased free water boluses on 9/2, now that tube feeds are held, have ordered D5W infusion       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   Nonischemic cardiomyopathy   AICD - Ef 15 %, RV failure, mod pulm HTN- follow with Dr Lovena Le - weight: 90 on admission >>>  84 >> 81 now -following I/Os - on Metoprolol- BP borderline- hold off on ACE I for now  Intake/Output Summary (Last 24 hours) at 12/22/16 0748 Last data filed at 12/22/16 0600  Gross per 24 hour  Intake          1653.33 ml  Output              100 ml  Net          1553.33 ml     Acute respiratory failure with hypoxia  - in relation to sepsis- extubated and stable      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 - on baby aspirin  - Heparin>> Xarelto     Arterial thrombosis - right brachial occurring after A-line - s/p thrombectomy- staples intact  Left arm edema - no DVT on ultrasound on 8/23 - Had IV infiltration of contrast dye 9/3    Fatty infiltration of liver -  Currently weight and diabetes appear controlled    DM (diabetes mellitus), type 2  - A1c 5.8 on 8/10 - on Metformin at home- sugars well controlled here   CBG (last 3)   Recent Labs  12/21/16 2148 12/22/16 0120 12/22/16 0358  GLUCAP 91 87 123*   DVT prophylaxis: heparin Code Status: Full code Family Communication: daughter [name] and wife by telephone 9/2 Disposition Plan: TBD    Consultants:   Neurology  PCCM  Vascular surgery  Gen surgery  Procedures  8/10- ex lap- patch repair of pyloric ucler  8/13- brachial embolectomy 2 D ECHO Left ventricle: The cavity size was moderately dilated. Wall  thickness was normal. Systolic function was severely reduced. The  estimated ejection fraction was 15%. Diffuse hypokinesis. There  is akinesis of the inferolateral and inferior myocardium. The  study is not technically sufficient to allow evaluation of LV  diastolic function. - Aortic valve: There was mild regurgitation. - Mitral valve: There was moderate regurgitation. - Left atrium: The atrium was severely dilated. - Right ventricle: The cavity size was moderately dilated. Systolic  function was moderately reduced. - Right atrium: The atrium was severely dilated. - Tricuspid valve: There was moderate regurgitation. - Pulmonary arteries: Systolic pressure was moderately increased.  Antimicrobials:  Anti-infectives    Start     Dose/Rate Route Frequency Ordered Stop   12/21/16 1500  piperacillin-tazobactam (ZOSYN) IVPB 3.375 g     3.375 g 12.5 mL/hr over 240 Minutes Intravenous Every 8 hours 12/21/16 1456     12/13/16 1000  amoxicillin (AMOXIL) 250 MG/5ML suspension 1,000 mg     1,000 mg Per Tube Every 12 hours 12/13/16 0935 12/16/16 1833    12/12/16 1900  piperacillin-tazobactam (ZOSYN) IVPB 3.375 g  Status:  Discontinued     3.375 g 12.5 mL/hr over 240 Minutes Intravenous Every 8 hours 12/12/16 1759 12/13/16 0911   12/12/16 1400  Ampicillin-Sulbactam (UNASYN) 3 g in sodium chloride 0.9 % 100 mL IVPB  Status:  Discontinued     3 g 200 mL/hr over 30 Minutes Intravenous Every 6 hours 12/12/16 1215 12/12/16 1753   12/10/16 0600  amoxicillin (AMOXIL) 250 MG/5ML suspension 1,000 mg  Status:  Discontinued     1,000 mg Per Tube Every 12 hours 12/09/16 1701 12/12/16 1206   12/09/16 1130  amoxicillin (AMOXIL) 250 MG/5ML suspension 1,000 mg  Status:  Discontinued     1,000 mg Oral Every 12 hours 12/09/16 1117 12/09/16 1118   12/09/16 1130  amoxicillin (AMOXIL) 250  MG/5ML suspension 1,000 mg  Status:  Discontinued     1,000 mg Per Tube Every 12 hours 12/09/16 1118 12/09/16 1701   12/04/16 2200  clarithromycin (BIAXIN) tablet 500 mg     500 mg Per Tube Every 12 hours 12/04/16 1107 12/16/16 2158   12/04/16 2000  piperacillin-tazobactam (ZOSYN) IVPB 3.375 g     3.375 g 12.5 mL/hr over 240 Minutes Intravenous Every 8 hours 12/04/16 1339 12/08/16 2359   12/04/16 1600  metroNIDAZOLE (FLAGYL) tablet 500 mg  Status:  Discontinued     500 mg Per Tube Every 8 hours 12/04/16 1107 12/09/16 1117   12/02/16 1100  clarithromycin (BIAXIN) 250 MG/5ML suspension 500 mg  Status:  Discontinued     500 mg Per Tube Every 12 hours 12/02/16 1012 12/04/16 1107   12/02/16 1045  metroNIDAZOLE (FLAGYL) 50 mg/ml oral suspension 500 mg  Status:  Discontinued     500 mg Per Tube 3 times daily 12/02/16 1036 12/04/16 1107   12/02/16 1015  metroNIDAZOLE (FLAGYL) 50 mg/ml oral suspension 250 mg  Status:  Discontinued     250 mg Per Tube 4 times daily 12/02/16 1011 12/02/16 1036   11/28/16 0800  fluconazole (DIFLUCAN) IVPB 200 mg  Status:  Discontinued     200 mg 100 mL/hr over 60 Minutes Intravenous Every 24 hours 11/27/16 0624 12/02/16 1018   11/27/16 0630  fluconazole  (DIFLUCAN) IVPB 400 mg     400 mg 100 mL/hr over 120 Minutes Intravenous  Once 11/27/16 0622 11/27/16 0841   11/27/16 0600  piperacillin-tazobactam (ZOSYN) IVPB 3.375 g  Status:  Discontinued     3.375 g 12.5 mL/hr over 240 Minutes Intravenous Every 8 hours 11/27/16 0534 12/04/16 1339   11/27/16 0515  piperacillin-tazobactam (ZOSYN) IVPB 3.375 g  Status:  Discontinued     3.375 g 100 mL/hr over 30 Minutes Intravenous  Once 11/27/16 0510 11/27/16 0518   11/27/16 0030  piperacillin-tazobactam (ZOSYN) IVPB 3.375 g     3.375 g 100 mL/hr over 30 Minutes Intravenous  Once 11/27/16 0016 11/27/16 0755   11/27/16 0030  vancomycin (VANCOCIN) IVPB 1000 mg/200 mL premix     1,000 mg 200 mL/hr over 60 Minutes Intravenous  Once 11/27/16 0016 11/27/16 0756     Objective: Vitals:   12/22/16 0333 12/22/16 0400 12/22/16 0500 12/22/16 0545  BP: (!) 102/28 (!) 101/30 (!) 107/22 (!) 99/25  Pulse: (!) 46 88 92 93  Resp: (!) 47 (!) 30 (!) 46 (!) 44  Temp: 99.6 F (37.6 C)     TempSrc: Axillary     SpO2: 100% 99% 99% 99%  Weight:   88.7 kg (195 lb 8.8 oz)   Height:        Intake/Output Summary (Last 24 hours) at 12/22/16 0748 Last data filed at 12/22/16 0600  Gross per 24 hour  Intake          1653.33 ml  Output              100 ml  Net          1553.33 ml   Filed Weights   12/20/16 0617 12/21/16 0602 12/22/16 0500  Weight: 85.9 kg (189 lb 6.4 oz) 78 kg (171 lb 14.4 oz) 88.7 kg (195 lb 8.8 oz)   Examination: General exam: NAD.  answer question appropriately when awakened, difficult to understand language  HEENT: PERRLA, oral mucosa moist, no sclera icterus or thrush Respiratory system: Clear to auscultation. Respiratory effort normal,  persistent upper airway congestion Cardiovascular system: S1 & S2 heard,  No murmurs  Gastrointestinal system: Abdomen wound with stool flowing from it.  Abdomen mildly distended.  cortrak in place Central nervous system: Alert and oriented to person. No focal  neurological deficits. Extremities: No cyanosis, clubbing or edema Skin: No rashes or ulcers Psychiatry:  flat affect  Data Reviewed: I have personally reviewed following labs and imaging studies  CBC:  Recent Labs Lab 12/19/16 0638 12/20/16 0540 12/21/16 0803 12/21/16 2103 12/22/16 0514  WBC 7.1 9.2 8.5 7.3 7.1  NEUTROABS  --   --   --  5.4  --   HGB 9.9* 9.6* 9.1* 8.4* 8.4*  HCT 31.8* 31.1* 29.5* 27.3* 27.4*  MCV 106.4* 105.1* 106.5* 105.8* 105.8*  PLT 244 241 215 247 678   Basic Metabolic Panel:  Recent Labs Lab 12/15/16 1453  12/18/16 0207  12/19/16 9381 12/20/16 0540 12/21/16 0421 12/21/16 2103 12/22/16 0514  NA  --   < > 152*  --  147* 149* 147* 144 147*  K  --   < > 3.4*  < > 3.8 3.9 3.7 4.1 4.1  CL  --   < > 122*  --  118* 118* 117* 113* 115*  CO2  --   < > 24  --  21* '22 22 24 24  '$ GLUCOSE  --   < > 168*  --  175* 175* 195* 100* 107*  BUN  --   < > 34*  --  30* 32* 36* 33* 36*  CREATININE  --   < > 0.82  --  0.88 0.91 0.94 0.94 1.02  CALCIUM  --   < > 7.7*  --  7.6* 7.5* 7.5* 7.4* 7.5*  MG 2.2  --  2.1  --  2.5* 2.2 2.2  --   --   PHOS  --   --   --   --   --  2.3*  --   --   --   < > = values in this interval not displayed. GFR: Estimated Creatinine Clearance: 72.9 mL/min (by C-G formula based on SCr of 1.02 mg/dL). Liver Function Tests:  Recent Labs Lab 12/17/16 0427 12/20/16 0540 12/21/16 0421 12/22/16 0514  AST 65* 46* 201* 149*  ALT 47 48 96* 125*  ALKPHOS 60 64 155* 156*  BILITOT 0.7 0.6 0.8 0.7  PROT 5.9* 6.0* 5.8* 6.0*  ALBUMIN 1.8* 1.7* 1.6* 1.6*   No results for input(s): LIPASE, AMYLASE in the last 168 hours. No results for input(s): AMMONIA in the last 168 hours. Coagulation Profile: No results for input(s): INR, PROTIME in the last 168 hours. Cardiac Enzymes: No results for input(s): CKTOTAL, CKMB, CKMBINDEX, TROPONINI in the last 168 hours. BNP (last 3 results) No results for input(s): PROBNP in the last 8760  hours. HbA1C: No results for input(s): HGBA1C in the last 72 hours. CBG:  Recent Labs Lab 12/21/16 1512 12/21/16 2027 12/21/16 2148 12/22/16 0120 12/22/16 0358  GLUCAP 105* 97 91 87 123*   Lipid Profile: No results for input(s): CHOL, HDL, LDLCALC, TRIG, CHOLHDL, LDLDIRECT in the last 72 hours. Thyroid Function Tests: No results for input(s): TSH, T4TOTAL, FREET4, T3FREE, THYROIDAB in the last 72 hours. Anemia Panel: No results for input(s): VITAMINB12, FOLATE, FERRITIN, TIBC, IRON, RETICCTPCT in the last 72 hours. Urine analysis:    Component Value Date/Time   COLORURINE YELLOW 09/10/2016 1002   APPEARANCEUR CLEAR 09/10/2016 1002   LABSPEC 1.025 09/10/2016 1002  PHURINE 6.5 09/10/2016 1002   GLUCOSEU NEGATIVE 09/10/2016 1002   [place] 09/10/2016 1002   [place] (A) 09/10/2016 1002   [place] 09/10/2016 1002   PROTEINUR NEGATIVE 06/10/2010 0954   UROBILINOGEN >=8.0 (A) 09/10/2016 1002   NITRITE NEGATIVE 09/10/2016 1002   LEUKOCYTESUR NEGATIVE 09/10/2016 1002    Recent Results (from the past 240 hour(s))  Culture, blood (routine x 2)     Status: None   Collection Time: 12/16/16  4:54 AM  Result Value Ref Range Status   Specimen Description BLOOD LEFT HAND  Final   Special Requests IN PEDIATRIC BOTTLE Blood Culture adequate volume  Final   Culture NO GROWTH 5 DAYS  Final   Report Status 12/21/2016 FINAL  Final  Culture, blood (routine x 2)     Status: None   Collection Time: 12/16/16  4:59 AM  Result Value Ref Range Status   Specimen Description BLOOD LEFT HAND  Final   Special Requests IN PEDIATRIC BOTTLE Blood Culture adequate volume  Final   Culture NO GROWTH 5 DAYS  Final   Report Status 12/21/2016 FINAL  Final  Surgical pcr screen     Status: None   Collection Time: 12/21/16  9:33 PM  Result Value Ref Range Status   MRSA, PCR NEGATIVE NEGATIVE Final   Staphylococcus aureus NEGATIVE NEGATIVE Final    Comment: (NOTE) The Xpert SA  Assay (FDA approved for NASAL specimens in patients 23 years of age and older), is one component of a comprehensive surveillance program. It is not intended to diagnose infection nor to guide or monitor treatment.     Radiology Studies: Ct Abdomen Pelvis W Contrast  Addendum Date: 12/21/2016   ADDENDUM REPORT: 12/21/2016 20:15 CONTRAST EXTRAVASATION CONSULTATION: Type of contrast:  Isovue 300 Site of extravasation: Proximal left upper extremity peripheral IV site. Estimated volume of extravasation: 80 ml Area of extravasation scanned with CT? no PATIENT'S SIGNS AND SYMPTOMS Skin blistering/ulceration: no Decrease capillary refill: no Change in skin color: no Decreased motor function or severe tightness: no Decreased pulses distal to site of extravasation: no Altered sensation: no Increasing pain or signs of increased swelling during observation: no TREATMENT Observation period at site: Immediately after the extravasation event by Dr. Polly Cobia. Patient subsequently returned to inpatient floor. Limb elevation and ice / heat pack application recommended. Plastic surgery consulted? no DOCUMENTATION AND FOLLOW-UP Was additional follow up assigned to PA's? no Patient's questions answered? yes Patient is an inpatient and by report the CT technologist informed the nursing staff taking care of the patient about the IV contrast extravasation event. Electronically Signed   By: Ilona Sorrel M.D.   On: 12/21/2016 20:15   Result Date: 12/21/2016 CLINICAL DATA:  Perforated duodenal ulcer, status post Phillip Heal patch on 11/27/2016. 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 Abdominal pain. EXAM: CT ABDOMEN AND PELVIS WITH CONTRAST TECHNIQUE: Multidetector CT imaging of the abdomen and pelvis was performed using the standard protocol following bolus administration of intravenous contrast. CONTRAST:  <See Chart> ISOVUE-300 IOPAMIDOL (ISOVUE-300) INJECTION 61% COMPARISON:  12/13/2016 FINDINGS: The patient was unable to raise is all arms,  causing streak artifact which reduces diagnostic sensitivity and specificity. Lower chest: Moderate cardiomegaly. AICD lead noted. Moderate right and small left pleural effusions with passive atelectasis. Hepatobiliary: Hypodense lesion posteriorly in the right hepatic lobe is obscured by the streak artifact but appears similar to 12/13/16. Pancreas: Unremarkable Spleen: Unremarkable Adrenals/Urinary Tract: Several small renal cysts appear stable. Adrenal glands normal. Stomach/Bowel: A feeding tube  extends through the stomach and duodenum to terminate at about the level of the ligament of Treitz. Again observed are loculations of abnormal gas density along the gastrohepatic ligament, not appreciably changed in position and with marginal walls. There is prominent abnormal wall thickening in the sigmoid colon along a distended loop. Within the adjacent sigmoid colon mesentery and adjacent omentum, there is a 16.7 by 5.6 by 7.0 cm (volume = 340 cm^3) abscess which appears to drain through the omentum to the wound about at image 62/3, and also shown on image 69/8. Moreover, there is a faintly seen potential connection of the proximal sigmoid colon to this abscess cavity on images 17-18 of series 7, with there is a small linear gas collection tracking from the sigmoid to the collection. This smaller connection to the sigmoid colon is not absolutely certain but is suspected. Orally administered contrast extends through to the rectum. I do not see well-defined extension of oral contrast into the dominant abscess cavity. Vascular/Lymphatic: Aortoiliac atherosclerotic vascular disease. Reproductive: Unremarkable Other: Low-grade mesenteric, omental, and subcutaneous edema. Mildly asymmetric edema along the right flank. Musculoskeletal: Bridging spurring of both sacroiliac joints. Lumbar spondylosis and degenerative disc disease with impingement at L3-4 and L4-5. IMPRESSION: 1. There is an approximately 340 cc abscess  tracking along the sigmoid colon mesentery and omentum and draining out through the wound. Questionable connection of this abscess to the thickened and abnormal sigmoid colon. However, we do not demonstrate definite oral contrast extends in from the sigmoid colon into this abscess, and the fistulous connection between the sigmoid colon and the abscess is questionable. 2. Several stable loculations of gas along the gastrohepatic ligament, similar to 12/13/2016. No significant fluid component to these presume small abscesses which have a similar configuration to the prior exam. 3. Stable small fluid density structure along the posterior right hepatic lobe margin, partially obscured by streak artifact. 4. Third spacing of fluid with mesenteric, omental, and subcutaneous edema. 5. Feeding tube extends to the ligament of Treitz. 6. Moderate cardiomegaly. Moderate right and small left pleural effusions with passive atelectasis. Electronically Signed: By: Gaylyn Rong M.D. On: 12/21/2016 14:13   Dg Chest Port 1 View  Result Date: 12/21/2016 CLINICAL DATA:  Central line placement EXAM: PORTABLE CHEST 1 VIEW COMPARISON:  12/21/2016 at 6:12 FINDINGS: There is a new left jugular central line, extending to the low SVC. No pneumothorax. Unchanged cardiomegaly. Grossly intact transvenous cardiac leads. Mild improvement, with partial clearance of central/basilar ground-glass opacities. IMPRESSION: 1. New left jugular central line appears satisfactorily positioned. No pneumothorax. 2. Unchanged cardiomegaly. Partial clearance of central and basilar airspace opacities. Electronically Signed   By: Ellery Plunk M.D.   On: 12/21/2016 21:37   Dg Chest Port 1 View  Result Date: 12/21/2016 CLINICAL DATA:  Fever.  Follow-up pulmonary edema. EXAM: PORTABLE CHEST 1 VIEW COMPARISON:  12/19/2016, 12/16/2016 and earlier, including CT chest 11/27/2016. FINDINGS: Cardiac silhouette markedly enlarged, unchanged. Worsening  interstitial pulmonary edema since the examination 2 days ago. New consolidation at the right lung base. Bilateral pleural effusions suspected. Feeding tube courses below the diaphragm into the stomach. Left subclavian single lead transvenous pacemaker unchanged and appears intact. IMPRESSION: 1. Worsening CHF and/or fluid overload, with increasing interstitial pulmonary edema and small bilateral pleural effusions. 2. New atelectasis versus pneumonia at the right lung base. Pneumonia is favored, query aspiration. Electronically Signed   By: Hulan Saas M.D.   On: 12/21/2016 08:03   Scheduled Meds: . aspirin  81 mg Per  Tube Daily  . chlorhexidine   Topical Once  . chlorhexidine gluconate (MEDLINE KIT)  15 mL Mouth Rinse BID  . Chlorhexidine Gluconate Cloth  6 each Topical Daily  . feeding supplement (PRO-STAT SUGAR FREE 64)  30 mL Per Tube BID  . free water  200 mL Per Tube Q3H  . insulin aspart  0-20 Units Subcutaneous Q4H  . lidocaine (PF)  5 mL Intradermal Once  . lidocaine (PF)      . mouth rinse  15 mL Mouth Rinse QID  . metoprolol tartrate  50 mg Per Tube BID  . pantoprazole sodium  40 mg Per Tube BID  . rivaroxaban  20 mg Per Tube Daily  . sodium chloride flush  10-40 mL Intracatheter Q12H   Continuous Infusions: . sodium chloride Stopped (12/14/16 0953)  . sodium chloride 500 mL (12/03/16 0126)  . dextrose 25 mL/hr at 12/21/16 2352  . feeding supplement (VITAL 1.5 CAL) 1,000 mL (12/20/16 0825)  . piperacillin-tazobactam (ZOSYN)  IV 3.375 g (12/22/16 0517)    LOS: 25 days   Critical Care Time spent in minutes: [place], MD [facility] Pager: [phone] [email or web address] Password [email or web address] 12/22/2016, 7:48 AM

## 2016-12-22 NOTE — Progress Notes (Signed)
Orthopedic Tech Progress Note Patient Details:  Jacob Pittman [birth year] [id number]  Ortho Devices Type of Ortho Device: Abdominal binder Ortho Device/Splint Interventions: Casandra Doffing 12/22/2016, 5:13 PM

## 2016-12-22 NOTE — Care Management Note (Addendum)
Case Management Note  Patient Details  Name: Jacob Pittman MRN: [id number] Date of Birth: 1945/11/19  Subjective/Objective:  Transfer to SDU from 3E, patient with perforated pyloric ulcer, exploratoary lap, graham patch repair 8/10, fascial dehiscence, also Ltach was denied by insurance previously.  There is a palliative meeting tomorrow at 2 pm.  IR has been consulted for  Intra abd abscess drain placement.   9/13 1517 Letha Cape RN, BSN- Peforated gastric ulcer, peritonitis, intra abd abscess and colonic fistula , hypotension, palliative meeting yesterday- going to comfort care, drain removed, tube feeds dc'd, eating as tolerated.  Cont with iv abx to see if makes a difference in 48 hrs if not will decide on hospice options.   9/14 1531 Letha Cape RN, BSN - plan is for SNF per MD, CSW aware.               Action/Plan: NCM will follow for dc needs.   Expected Discharge Date:   (UNKNOWN)               Expected Discharge Plan:     In-House Referral:     Discharge planning Services  CM Consult  Post Acute Care Choice:    Choice offered to:     DME Arranged:    DME Agency:     HH Arranged:    HH Agency:     Status of Service:  In process, will continue to follow  If discussed at Long Length of Stay Meetings, dates discussed:    Additional Comments:  Leone Haven, RN 12/22/2016, 5:19 PM

## 2016-12-22 NOTE — Consult Note (Signed)
Chief Complaint: Patient was seen in consultation today for  Intra abdominal abscess drain placement Chief Complaint  Patient presents with  . Abdominal Pain   at the request of Dr Nydia Bouton  Referring Physician(s): [place]  Supervising Physician: Marybelle Killings  Patient Status: [facility] - In-pt  History of Present Illness: Jacob Pittman is a 71 y.o. male   Perforated pyloric ulcer Exploratory lap; graham patch repair- 11/27/2016 Fascial dehiscence CT IMPRESSION: 1. There is an approximately 340 cc abscess tracking along the sigmoid colon mesentery and omentum and draining out through the wound. Questionable connection of this abscess to the thickened and abnormal sigmoid colon. However, we do not demonstrate definite oral contrast extends in from the sigmoid colon into this abscess, and the fistulous connection between the sigmoid colon and the abscess is questionable. 2. Several stable loculations of gas along the gastrohepatic ligament, similar to 12/13/2016. No significant fluid component to these presume small abscesses which have a similar configuration to the prior exam. 3. Stable small fluid density structure along the posterior right hepatic lobe margin, partially obscured by streak artifact. 4. Third spacing of fluid with mesenteric, omental, and subcutaneous edema. 5. Feeding tube extends to the ligament of Treitz. 6. Moderate cardiomegaly. Moderate right and small left pleural effusions with passive atelectasis.  Request for abscess drain placement Imaging reviewed with Dr Josue Hector procedure On Xarelto---will hold in am   Past Medical History:  Diagnosis Date  . ANXIETY 02/04/2009  . CHF (congestive heart failure) ([place])   . Chronic systolic CHF (congestive heart failure) ([place])   . DIABETES MELLITUS, TYPE II 11/07/2008  . 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 HIP PAIN, RIGHT 11/06/2009  . HYPERLIPIDEMIA 11/07/2008  . HYPERTENSION  10/08/2008  . Nonischemic cardiomyopathy [facility])    s/p ICD implantation 03-2013 by Dr Lovena Le  . OSTEOARTHRITIS, KNEE, RIGHT 10/08/2008  . Peripheral arterial disease [facility])     Past Surgical History:  Procedure Laterality Date  . CARDIAC DEFIBRILLATOR PLACEMENT Left 04/10/13   BTK single chamber ICD implanted by Dr Lovena Le for primary prevention  . EMBOLECTOMY Right 11/30/2016   Procedure: BRACHIAL EMBOLECTOMY WITH VEIN PATCH ANGIOPLASTY;  Surgeon: Elam Dutch, MD;  Location: [place];  Service: Vascular;  Laterality: Right;  . IMPLANTABLE CARDIOVERTER DEFIBRILLATOR IMPLANT N/A 04/10/2013   Procedure: IMPLANTABLE CARDIOVERTER DEFIBRILLATOR IMPLANT;  Surgeon: Evans Lance, MD;  Location: [facility] CATH LAB;  Service: Cardiovascular;  Laterality: N/A;  . LAPAROTOMY N/A 11/27/2016   Procedure: EXPLORATORY LAPAROTOMY abdominal exploration oversew pyloric channel ulcer gram patch repair perforated ulcer;  Surgeon: Alphonsa Overall, MD;  Location: [facility] ORS;  Service: General;  Laterality: N/A;  . s/p left broken arm with pin     1980's    Allergies: Patient has no known allergies.  Medications: Prior to Admission medications   Medication Sig Start Date End Date Taking? Authorizing Provider  aspirin 81 MG EC tablet Take 81 mg by mouth daily.     Yes [provider]  carvedilol (COREG) 12.5 MG tablet TAKE ONE TABLET BY MOUTH TWICE DAILY 04/27/16  Yes [place], Peter M, MD  fluticasone [facility]) 50 MCG/ACT nasal spray Place 2 sprays into both nostrils daily. 09/22/13  Yes Biagio Borg, MD  furosemide (LASIX) 40 MG tablet Take 40 mg by mouth daily.   Yes [provider]  metFORMIN (GLUCOPHAGE-XR) 500 MG 24 hr tablet TAKE TWO TABLETS BY MOUTH ONCE DAILY IN THE MORNING 03/18/16  Yes Biagio Borg,  MD  metolazone (ZAROXOLYN) 2.5 MG tablet TAKE ONE TABLET BY MOUTH EVERY OTHER DAY 01/10/16  Yes [place], Peter M, MD  rosuvastatin (CRESTOR) 20 MG tablet Take 1 tablet (20 mg total) by mouth daily.  09/10/16  Yes [name], MD  albuterol (PROVENTIL HFA;VENTOLIN HFA) 108 (90 BASE) MCG/ACT inhaler Inhale 2 puffs into the lungs every 6 (six) hours as needed for wheezing or shortness of breath. 07/19/14   [name], MD  Blood Glucose Monitoring Suppl (ONE TOUCH ULTRA SYSTEM KIT) w/Device KIT Use as directed daily 09/10/16   [name], MD  cetirizine (ZYRTEC) 10 MG tablet Take 1 tablet (10 mg total) by mouth daily. Patient not taking: Reported on 11/26/2016 09/22/13   [name], MD  furosemide (LASIX) 40 MG tablet Take 1 tablet (40 mg total) by mouth daily. Patient not taking: Reported on 11/26/2016 07/08/16 10/15/16  [place], Peter M, MD  glucose blood test strip Use as instructed 09/10/16   [name], MD  Lancets MISC Use as directed once daily 09/10/16   [name], MD  sildenafil (REVATIO) 20 MG tablet Take 1 tablet (20 mg total) by mouth as directed. Take 2-4 tablets by mouth as needed Patient taking differently: Take 40 mg by mouth daily as needed (ed).  10/15/16   Marinus Maw, MD  silver sulfADIAZINE (SILVADENE) 1 % cream Apply 1 application topically daily. Patient not taking: Reported on 11/26/2016 10/16/15   Carrington Clamp, DPM  spironolactone (ALDACTONE) 25 MG tablet Take 0.5 tablets (12.5 mg total) by mouth daily. Patient not taking: Reported on 11/26/2016 06/08/16   [place], Peter M, MD  Tadalafil 2.5 MG TABS Take 1 tablet (2.5 mg total) by mouth daily as needed. Patient taking differently: Take 2.5 mg by mouth daily as needed (erectile dysfunction).  03/04/15   Dwana Melena, PA-C     Family History  Problem Relation Age of Onset  . Diabetes Mother   . Hypertension Mother     Social History   Social History  . Marital status: Legally Separated    Spouse name: N/A  . Number of children: 2  . Years of education: N/A   Occupational History  . retired [employer]    Social History Main Topics  . Smoking status: Former Games developer  . Smokeless tobacco: Never Used    . Alcohol use Yes     Comment: 1 pint daily  . Drug use: No  . Sexual activity: No   Other Topics Concern  . None   Social History Narrative   Married/separated although wife comes to appts. With him   10th grade education - poor reading and writing ability   Incarcerated for 6 months in 2009.    Review of Systems: A 12 point ROS discussed and pertinent positives are indicated in the HPI above.  All other systems are negative.  Review of Systems  Constitutional: Positive for activity change, appetite change, diaphoresis and fever.  Respiratory: Positive for shortness of breath.   Cardiovascular: Negative for chest pain.  Gastrointestinal: Positive for abdominal pain and nausea.  Neurological: Positive for weakness.  Psychiatric/Behavioral: Positive for confusion. Negative for behavioral problems.    Vital Signs: BP (!) 119/43   Pulse (!) 105   Temp 99.8 F (37.7 C) (Oral)   Resp (!) 43   Ht 6\' 1"  (1.854 m)   Wt 195 lb 8.8 oz (88.7 kg)   SpO2 99%   BMI 25.80 kg/m  Physical Exam  Cardiovascular: Normal rate.   Pulmonary/Chest: Effort normal. He has wheezes.  Abdominal: Soft. Bowel sounds are normal. There is tenderness.  Neurological:  Semi alert   Skin: Skin is warm.  Open abd wound  Psychiatric: He has a normal mood and affect.  Is able to identify self DOB and name  conseted with Dtr [name] via phone  Nursing note and vitals reviewed.   Mallampati Score:  MD Evaluation Airway: WNL Heart: WNL Abdomen: WNL ASA  Classification: 3  Imaging: Ct Head Wo Contrast  Result Date: 12/03/2016 CLINICAL DATA:  Stroke follow-up EXAM: CT HEAD WITHOUT CONTRAST TECHNIQUE: Contiguous axial images were obtained from the base of the skull through the vertex without intravenous contrast. COMPARISON:  12/01/2016 FINDINGS: Brain: No acute infarct is identified. Symmetric normal density of the bilateral deep cerebral white matter which was highlighted previously. Small  remote bilateral peripheral cerebellar infarcts. There is chronic microvascular ischemic gliosis with confluent low-density greatest in the frontal periventricular white matter. Generalized atrophy. No hemorrhage, hydrocephalus, or masslike findings. Vascular: Atherosclerotic calcification. Skull: No acute or aggressive finding. Sinuses/Orbits: Essentially clear sinuses and mastoids. Nasal and oral intubation. IMPRESSION: 1. No acute finding. No acute infarct detected in the cerebellum as questioned previously. 2. Small remote bilateral cerebellar infarcts. Chronic small vessel ischemia in the cerebral white matter. Electronically Signed   By: Monte Fantasia M.D.   On: 12/03/2016 13:15   Ct Head Wo Contrast  Result Date: 12/01/2016 CLINICAL DATA:  Altered level of consciousness. Recent episode of hypertension. EXAM: CT HEAD WITHOUT CONTRAST TECHNIQUE: Contiguous axial images were obtained from the base of the skull through the vertex without intravenous contrast. COMPARISON:  June 10, 2010 FINDINGS: Brain: There is mild diffuse atrophy. There is no intracranial mass, hemorrhage, extra-axial fluid collection, or midline shift. There is decreased attenuation involving the dentate nucleus of the right cerebellum and immediately adjacent cerebellar tissue, a finding concerning for recent infarct in the right mid cerebellar region. No other potential acute/recent infarct is demonstrated on this study. There is small vessel disease throughout the centra semiovale bilaterally, present on prior study. Vascular: No hyperdense vessel. There is calcification in both carotid siphon regions and more subtly in each middle cerebral artery. There is also calcification in the distal vertebral arteries. Skull: Bony calvarium appears intact. Sinuses/Orbits: There is mucosal thickening in the maxillary antra, more on the left than on the right, with a probable retention cyst in the inferior posterior left maxillary antrum.  There is mucosal thickening in several ethmoid air cells bilaterally. Other paranasal sinuses appear clear. Orbits appear symmetric bilaterally. Other: There is mucosal thickening in several mastoid air cells on the right. Visualized mastoid air cells elsewhere clear. Patient is intubated. IMPRESSION: Decreased attenuation involving the right dentate nucleus in the right cerebellum and immediately adjacent cerebellar parenchyma, concerning for recent and potentially acute infarct in this area. Elsewhere there is atrophy with moderate periventricular small vessel disease. There is no intracranial mass, hemorrhage, or extra-axial fluid collection. Multiple foci of arterial vascular calcification noted. Areas of paranasal sinus disease. Areas of right-sided mastoid disease. Electronically Signed   By: Lowella Grip III M.D.   On: 12/01/2016 13:57   Ct Abdomen Pelvis W Contrast  Addendum Date: 12/21/2016   ADDENDUM REPORT: 12/21/2016 20:15 CONTRAST EXTRAVASATION CONSULTATION: Type of contrast:  Isovue 300 Site of extravasation: Proximal left upper extremity peripheral IV site. Estimated volume of extravasation: 80 ml Area of extravasation scanned with CT? no PATIENT'S SIGNS AND  SYMPTOMS Skin blistering/ulceration: no Decrease capillary refill: no Change in skin color: no Decreased motor function or severe tightness: no Decreased pulses distal to site of extravasation: no Altered sensation: no Increasing pain or signs of increased swelling during observation: no TREATMENT Observation period at site: Immediately after the extravasation event by Dr. Polly Cobia. Patient subsequently returned to inpatient floor. Limb elevation and ice / heat pack application recommended. Plastic surgery consulted? no DOCUMENTATION AND FOLLOW-UP Was additional follow up assigned to PA's? no Patient's questions answered? yes Patient is an inpatient and by report the CT technologist informed the nursing staff taking care of the patient about  the IV contrast extravasation event. Electronically Signed   By: Ilona Sorrel M.D.   On: 12/21/2016 20:15   Result Date: 12/21/2016 CLINICAL DATA:  Perforated duodenal ulcer, status post Phillip Heal patch on 11/27/2016. 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 Abdominal pain. EXAM: CT ABDOMEN AND PELVIS WITH CONTRAST TECHNIQUE: Multidetector CT imaging of the abdomen and pelvis was performed using the standard protocol following bolus administration of intravenous contrast. CONTRAST:  <See Chart> ISOVUE-300 IOPAMIDOL (ISOVUE-300) INJECTION 61% COMPARISON:  12/13/2016 FINDINGS: The patient was unable to raise is all arms, causing streak artifact which reduces diagnostic sensitivity and specificity. Lower chest: Moderate cardiomegaly. AICD lead noted. Moderate right and small left pleural effusions with passive atelectasis. Hepatobiliary: Hypodense lesion posteriorly in the right hepatic lobe is obscured by the streak artifact but appears similar to 12/13/16. Pancreas: Unremarkable Spleen: Unremarkable Adrenals/Urinary Tract: Several small renal cysts appear stable. Adrenal glands normal. Stomach/Bowel: A feeding tube extends through the stomach and duodenum to terminate at about the level of the ligament of Treitz. Again observed are loculations of abnormal gas density along the gastrohepatic ligament, not appreciably changed in position and with marginal walls. There is prominent abnormal wall thickening in the sigmoid colon along a distended loop. Within the adjacent sigmoid colon mesentery and adjacent omentum, there is a 16.7 by 5.6 by 7.0 cm (volume = 340 cm^3) abscess which appears to drain through the omentum to the wound about at image 62/3, and also shown on image 69/8. Moreover, there is a faintly seen potential connection of the proximal sigmoid colon to this abscess cavity on images 17-18 of series 7, with there is a small linear gas collection tracking from the sigmoid to the collection. This smaller connection to  the sigmoid colon is not absolutely certain but is suspected. Orally administered contrast extends through to the rectum. I do not see well-defined extension of oral contrast into the dominant abscess cavity. Vascular/Lymphatic: Aortoiliac atherosclerotic vascular disease. Reproductive: Unremarkable Other: Low-grade mesenteric, omental, and subcutaneous edema. Mildly asymmetric edema along the right flank. Musculoskeletal: Bridging spurring of both sacroiliac joints. Lumbar spondylosis and degenerative disc disease with impingement at L3-4 and L4-5. IMPRESSION: 1. There is an approximately 340 cc abscess tracking along the sigmoid colon mesentery and omentum and draining out through the wound. Questionable connection of this abscess to the thickened and abnormal sigmoid colon. However, we do not demonstrate definite oral contrast extends in from the sigmoid colon into this abscess, and the fistulous connection between the sigmoid colon and the abscess is questionable. 2. Several stable loculations of gas along the gastrohepatic ligament, similar to 12/13/2016. No significant fluid component to these presume small abscesses which have a similar configuration to the prior exam. 3. Stable small fluid density structure along the posterior right hepatic lobe margin, partially obscured by streak artifact. 4. Third spacing of fluid with mesenteric, omental,  and subcutaneous edema. 5. Feeding tube extends to the ligament of Treitz. 6. Moderate cardiomegaly. Moderate right and small left pleural effusions with passive atelectasis. Electronically Signed: By: Van Clines M.D. On: 12/21/2016 14:13   Ct Abdomen Pelvis W Contrast  Result Date: 12/13/2016 CLINICAL DATA:  71 year old male with sepsis, perforated duodenum bulb ulcer. Status post exploratory laparotomy, omental patch on 11/27/2016. EXAM: CT ABDOMEN AND PELVIS WITH CONTRAST TECHNIQUE: Multidetector CT imaging of the abdomen and pelvis was performed using the  standard protocol following bolus administration of intravenous contrast. CONTRAST:  137m ISOVUE-300 IOPAMIDOL (ISOVUE-300) INJECTION 61% COMPARISON:  CTA chest abdomen and pelvis 11/27/2016 FINDINGS: Lower chest: Stable cardiomegaly. No pericardial effusion. Increased small layering right pleural effusion and right lower lobe atelectasis. Left lower lobe and right middle lobe ventilation is stable to mildly improved since 11/27/2016. Enteric tube courses to the stomach. Hepatobiliary: Regressed perihepatic fluid, now trace. There is a small 14 mm subcapsular low-density area along the posterior right hepatic lobe which appears to be new on series 3, image 25. The remaining liver parenchyma is within normal limits. Negative gallbladder. No biliary ductal enlargement. Pancreas: Negative. Spleen: Trace perisplenic fluid, otherwise negative. Adrenals/Urinary Tract: Normal adrenal glands. Occasional benign renal cysts. Bilateral renal contrast enhancement and excretion is within normal limits. Foley catheter removed from the urinary bladder. Mild bladder distension. Stomach/Bowel: Fluid intermittently within large bowel to the rectum. Oral contrast has reached the mid transverse colon. Redundant and moderately dilated sigmoid colon mostly containing gas. Diverticulosis at the junction of the sigmoid and descending colon. Upper limits of normal gas and fluid or contrast filled left and transverse colon. Right colon appears normal aside from moderate diverticulosis. Diminutive or absent appendix. Negative terminal ileum. No dilated small bowel. Generalized mild to moderate mesenteric stranding along the greater omentum. Enteric tube courses to the distal gastric body. Continued small volume pneumoperitoneum about the gastric antrum (Series 3 images 21 and 22). The duodenum is decompressed. No retroperitoneal gas or fluid. There is a partially organized appearing fluid collection situated along the undersurface of the  redundant sigmoid colon and adjacent to some otherwise normal appearing distal small bowel loops which encompasses 79 x 59 x 83 mm (AP by transverse by CC). See series 3, image 60 in coronal image 35. Estimated volume of this dominant portion is 193 mL. This is contiguous with a small volume of fluid tracking into the cul-de-sac of the pelvis. The superficial aspect of this fluid abuts the ventral abdominal wall and wound. No gas identified within the collection. Occasional much smaller loculated collections, such as that along the ventral aspect of the transverse colon on series 3, image 29 and coronal image 20. These do not appear drainable. Vascular/Lymphatic: Extensive Aortoiliac calcified atherosclerosis. Major arterial structures in the abdomen and pelvis remain patent. Portal venous system is patent. Reproductive: Negative. Other: Ventral midline abdominal wound. Small volume of pelvic free fluid. Fat containing inguinal hernias are stable. Musculoskeletal: Stable visualized osseous structures. Thoracic spine ankylosis IMPRESSION: 1. Organizing fluid collection along the caudal aspect of redundant and distended proximal sigmoid colon in the mid abdomen is nonspecific but might represent a developing abscess. The dominant portion of this collection is estimated at 193 mL. This fluid is contiguous with a smaller volume of fluid tracking to the pelvic cul-de-sac, and also abuts normal appearing small bowel loops. Occasional other tiny loculated appearing mesenteric fluid collections which are not drainable. 2. Small volume of persistent pneumoperitoneum near the gastric antrum. Generalized mild  to moderate soft tissue stranding of the greater omentum. 3. Small volume perihepatic and perisplenic fluid. There is a new small subcapsular 15 mm collection at the posterior right liver. This is nonspecific, but a small liver abscess is difficult to exclude. 4. Increased small layering right pleural effusion. 5. NG tube  terminates at the gastric body. Oral contrast has reached the transverse colon but there is probably a degree of large bowel ileus, maximal at the proximal sigmoid in #1. Electronically Signed   By: Genevie Ann M.D.   On: 12/13/2016 16:21   Dg Chest Port 1 View  Result Date: 12/22/2016 CLINICAL DATA:  Shortness of Breath EXAM: PORTABLE CHEST 1 VIEW COMPARISON:  12/21/2016 FINDINGS: Left AICD remains in place, unchanged. Cardiomegaly with bilateral airspace opacities, right greater than left. Small bilateral effusions. Overall air perforation and the right lung worsened since prior study. IMPRESSION: Worsening bilateral airspace disease, right greater than left which could represent asymmetric edema or infection. Small effusions. Electronically Signed   By: Rolm Baptise M.D.   On: 12/22/2016 08:46   Dg Chest Port 1 View  Result Date: 12/21/2016 CLINICAL DATA:  Central line placement EXAM: PORTABLE CHEST 1 VIEW COMPARISON:  12/21/2016 at 6:12 FINDINGS: There is a new left jugular central line, extending to the low SVC. No pneumothorax. Unchanged cardiomegaly. Grossly intact transvenous cardiac leads. Mild improvement, with partial clearance of central/basilar ground-glass opacities. IMPRESSION: 1. New left jugular central line appears satisfactorily positioned. No pneumothorax. 2. Unchanged cardiomegaly. Partial clearance of central and basilar airspace opacities. Electronically Signed   By: Andreas Newport M.D.   On: 12/21/2016 21:37   Dg Chest Port 1 View  Result Date: 12/21/2016 CLINICAL DATA:  Fever.  Follow-up pulmonary edema. EXAM: PORTABLE CHEST 1 VIEW COMPARISON:  12/19/2016, 12/16/2016 and earlier, including CT chest 11/27/2016. FINDINGS: Cardiac silhouette markedly enlarged, unchanged. Worsening interstitial pulmonary edema since the examination 2 days ago. New consolidation at the right lung base. Bilateral pleural effusions suspected. Feeding tube courses below the diaphragm into the stomach. Left  subclavian single lead transvenous pacemaker unchanged and appears intact. IMPRESSION: 1. Worsening CHF and/or fluid overload, with increasing interstitial pulmonary edema and small bilateral pleural effusions. 2. New atelectasis versus pneumonia at the right lung base. Pneumonia is favored, query aspiration. Electronically Signed   By: Evangeline Dakin M.D.   On: 12/21/2016 08:03   Dg Chest Port 1 View  Result Date: 12/19/2016 CLINICAL DATA:  Atelectasis EXAM: PORTABLE CHEST 1 VIEW COMPARISON:  S8 01/07/2017 mean FINDINGS: NG tube has been exchanged for a feeding tube which extends below the hemidiaphragms into the stomach. Single lead left subclavian pacemaker/ defibrillator. Similar cardiomegaly with mild interstitial edema pattern and basilar atelectasis. No enlarging effusion or pneumothorax. No new focal collapse or consolidation. IMPRESSION: Stable cardiomegaly with mild interstitial edema pattern No significant interval change. Electronically Signed   By: Jerilynn Mages.  Shick M.D.   On: 12/19/2016 13:53   Dg Chest Port 1 View  Result Date: 12/16/2016 CLINICAL DATA:  Fever and cough. EXAM: PORTABLE CHEST 1 VIEW COMPARISON:  12/13/2016 . FINDINGS: NG tube noted with its tip coiled and projecting over the stomach. Cardiac pacer with lead tip over the right ventricle. Cardiomegaly with normal pulmonary vascularity. Bibasilar atelectasis. IMPRESSION: 1. NG tube noted with its tip coiled and projecting over the stomach. 2. Cardiac pacer with lead tip over the right ventricle. Cardiomegaly with normal pulmonary vascular. 3.  Basilar atelectasis. Electronically Signed   By: Marcello Moores  Register  On: 12/16/2016 10:45   Dg Chest Port 1 View  Result Date: 12/13/2016 CLINICAL DATA:  Fever. EXAM: PORTABLE CHEST 1 VIEW COMPARISON:  12/10/2016 FINDINGS: Endotracheal tube was removed. Nasogastric tube extends into the abdomen. Again noted is a left cardiac ICD. Heart remains enlarged for size. Right jugular central line has  been removed. Increased densities in the right lower chest. Upper lungs remain clear. Negative for a pneumothorax. IMPRESSION: Increased densities in the right lower chest. Findings may represent atelectasis. However, infection cannot be excluded based on history of fever. Recommend follow-up. Support apparatuses as described. Electronically Signed   By: Markus Daft M.D.   On: 12/13/2016 12:01   Dg Chest Port 1 View  Result Date: 12/10/2016 CLINICAL DATA:  Intubation. EXAM: PORTABLE CHEST 1 VIEW COMPARISON:  12/09/2016. FINDINGS: Endotracheal tube, NG tube, right IJ line stable position. Cardiac pacer stable position. Stable cardiomegaly. Low lung volumes with mild bibasilar atelectasis noted on today's exam. No pleural effusion. Pneumothorax . IMPRESSION: 1. Lines and tubes in stable position. 2. Low lung volumes with mild basilar atelectasis noted on today's exam. 2.  Stable cardiomegaly. Electronically Signed   By: Marcello Moores  Register   On: 12/10/2016 06:26   Dg Chest Port 1 View  Result Date: 12/09/2016 CLINICAL DATA:  Pulmonary edema. EXAM: PORTABLE CHEST 1 VIEW COMPARISON:  12/08/2016. FINDINGS: Endotracheal tube, NG tube, right IJ line stable position. Cardiac pacer stable position. Stable cardiomegaly. Low lung volumes. Improved aeration left lung base. No definite infiltrate. No prominent effusion. No pneumothorax. IMPRESSION: 1.  Lines and tubes in stable position. 2. Improved aeration left lung base. No definite infiltrate noted on today's exam. Low lung volumes. 3. Cardiac pacer stable position.  Stable cardiomegaly. Electronically Signed   By: Marcello Moores  Register   On: 12/09/2016 06:59   Dg Chest Port 1 View  Result Date: 12/08/2016 CLINICAL DATA:  Hypoxia EXAM: PORTABLE CHEST 1 VIEW COMPARISON:  December 06, 2016 FINDINGS: Endotracheal tube tip is 6.6 cm above the carina. Central catheter tip is in superior vena cava. Nasogastric tube tip and side port are in the stomach. Pacemaker lead is attached  to the right ventricle. No pneumothorax. There is a small left pleural effusion with patchy airspace consolidation in the left lower lobe. Lungs elsewhere are clear. Heart is mildly enlarged with pulmonary vascularity within normal limits. There is aortic atherosclerosis. No adenopathy. There is degenerative change in each shoulder. IMPRESSION: Tube and catheter positions as described without pneumothorax. Small left pleural effusion with probable pneumonia left lower lobe. Right lung clear. Stable cardiac prominence. There is aortic atherosclerosis. Aortic Atherosclerosis (ICD10-I70.0). Electronically Signed   By: Lowella Grip III M.D.   On: 12/08/2016 20:20   Dg Chest Port 1 View  Result Date: 12/06/2016 CLINICAL DATA:  Acute respiratory failure EXAM: PORTABLE CHEST 1 VIEW COMPARISON:  Four days ago FINDINGS: Endotracheal tube tip between the clavicular heads and carina. An orogastric tube reaches the stomach. Single chamber ICD/ pacer from the left into the right ventricle. Stable cardiomegaly. Right IJ line with tip more midline than before in the setting of rotation. It was more clearly over the SVC previously. Unchanged low volume chest with patchy basilar density and small left effusion. IMPRESSION: 1. Stable positioning of tubes and central line. 2. Low volumes with unchanged atelectasis or pneumonia and small left effusion. Electronically Signed   By: Monte Fantasia M.D.   On: 12/06/2016 07:19   Dg Chest Port 1 View  Result Date: 12/02/2016 EXAM: PORTABLE  CHEST 1 VIEW COMPARISON:  None. FINDINGS: Endotracheal tube, NG tube, right IJ line stable position. Cardiac pacer stable position. Cardiomegaly with pulmonary vascular prominence and bilateral interstitial prominence consistent with pulmonary interstitial edema. Small bilateral pleural effusions. No pneumothorax. IMPRESSION: 1.  Lines and tubes in stable position. 2. Cardiac pacer stable position. Cardiomegaly with pulmonary venous  congestion and bilateral interstitial prominence consistent with pulmonary interstitial edema again noted. Small bilateral pleural effusions. Chest appears unchanged from prior exam . Electronically Signed   By: Marcello Moores  Register   On: 12/02/2016 07:31   Dg Chest Port 1 View  Result Date: 11/30/2016 CLINICAL DATA:  Acute respiratory failure EXAM: PORTABLE CHEST 1 VIEW COMPARISON:  Chest radiograph from one day prior. FINDINGS: Endotracheal tube tip is 4.5 cm above the carina. Single lead left subclavian ICD and right internal jugular central venous catheter are stable in configuration. Enteric tube enters stomach with the tip not seen on this image. Stable cardiomediastinal silhouette with mild cardiomegaly. No pneumothorax. Stable small right pleural effusion. Stable mild pulmonary edema and mild hazy bibasilar lung opacities. IMPRESSION: 1. Well-positioned support structures . 2. Stable mild congestive heart failure. 3. Stable small right pleural effusion. 4. Stable mild hazy bibasilar lung opacities, favor atelectasis. Electronically Signed   By: Ilona Sorrel M.D.   On: 11/30/2016 07:06   Dg Chest Port 1 View  Result Date: 11/29/2016 CLINICAL DATA:  Acute respiratory failure EXAM: PORTABLE CHEST 1 VIEW COMPARISON:  11/27/2016 FINDINGS: Cardiac shadow remains enlarged. Defibrillator is again seen. Endotracheal tube, nasogastric catheter and right jugular central line are again seen and stable. The overall inspiratory effort is poor. No focal confluent infiltrate is seen. No acute bony abnormality is noted. Improvement in the vascular congestion interstitial edema is noted. IMPRESSION: Poor inspiratory effort with overall decrease in vascular congestion. Tubes and lines as described. Electronically Signed   By: Inez Catalina M.D.   On: 11/29/2016 07:04   Dg Chest Port 1 View  Result Date: 11/27/2016 CLINICAL DATA:  Central line placement.  Initial encounter. EXAM: PORTABLE CHEST 1 VIEW COMPARISON:  Chest  radiograph performed earlier today at 1:38 a.m. FINDINGS: The patient's endotracheal tube is seen ending 2-3 cm above the carina. A right IJ line is noted ending about the mid SVC. And enteric tube is noted extending below the diaphragm. The lungs are mildly hypoexpanded. Vascular congestion is noted. Small bilateral pleural effusions are suggested. Increased interstitial markings may reflect mild interstitial edema. There is no evidence of pneumothorax. The cardiomediastinal silhouette is mildly enlarged. An AICD is noted overlying the left chest wall, with a single lead ending overlying the right ventricle. No acute osseous abnormalities are seen. IMPRESSION: 1. Right IJ line noted ending about the mid SVC. 2. Endotracheal tube seen ending 2-3 cm above the carina. 3. Lungs mildly hypoexpanded. Vascular congestion and mild cardiomegaly. Small bilateral pleural effusions suggested. Increased interstitial markings raise concern for mild interstitial edema. Electronically Signed   By: Garald Balding M.D.   On: 11/27/2016 05:59   Dg Chest Portable 1 View  Result Date: 11/27/2016 CLINICAL DATA:  Respiratory failure.  Post intubation. EXAM: PORTABLE CHEST 1 VIEW COMPARISON:  11/26/2016 FINDINGS: Low lung volumes with stable cardiomegaly. Aortic atherosclerosis. ICD device projects over the left axilla with lead projecting over the right ventricle. New endotracheal tube is noted with tip 4 cm above the carina. Mild perihilar vascular congestion is seen with slight increase in airspace confluence in the right upper lobe. No acute nor suspicious  osseous abnormalities. Cervical and thoracic spondylosis. IMPRESSION: 1. Low lung volumes with stable cardiomegaly and aortic atherosclerosis. Mild central vascular congestion. 2. Endotracheal tube tip is 4 cm above the carina in satisfactory position. Electronically Signed   By: Ashley Royalty M.D.   On: 11/27/2016 02:43   Dg Chest Port 1 View  Result Date: 11/26/2016 CLINICAL  DATA:  Difficulty breathing. EXAM: PORTABLE CHEST 1 VIEW COMPARISON:  01/03/2015 FINDINGS: Stable mild cardiomegaly. Stable appearance of single lead ICD. Lungs show low volumes with bibasilar atelectasis, right greater than left. No overt edema or pleural fluid. IMPRESSION: Low bilateral lung volumes with bibasilar atelectasis. Electronically Signed   By: Aletta Edouard M.D.   On: 11/26/2016 23:41   Dg Abd Portable 1v  Result Date: 12/14/2016 CLINICAL DATA:  NG tube placement. EXAM: PORTABLE ABDOMEN - 1 VIEW COMPARISON:  CT abdomen pelvis dated December 13, 2016. FINDINGS: NG tube seen with the tip and distal side port in the gastric body. Unchanged mildly dilated loop of air-filled sigmoid colon. Enteric contrast is noted in the right and left colon. IMPRESSION: 1. Enteric tube in good position in the gastric body. 2. Unchanged focal dilatation of the sigmoid colon. Electronically Signed   By: Titus Dubin M.D.   On: 12/14/2016 16:42   Dg Abd Portable 1 View  Result Date: 11/27/2016 CLINICAL DATA:  Orogastric tube placement EXAM: PORTABLE ABDOMEN - 1 VIEW COMPARISON:  11/26/2016 FINDINGS: The tip and side port of a gastric tube are seen coiled in the left upper quadrant of the abdomen in the expected location of the stomach. Moderate degree of fecal retention is seen within large bowel. Central gas collection is again noted on the supine view. No radio-opaque calculi. IMPRESSION: Gastric tube with side port noted in the expected location of the stomach. Centrally located gas collection is again seen possibly representing free air as before. Electronically Signed   By: Ashley Royalty M.D.   On: 11/27/2016 03:04   Dg Abd Portable 2v  Result Date: 11/26/2016 CLINICAL DATA:  Abdominal pain, distention, nausea and vomiting. EXAM: PORTABLE ABDOMEN - 2 VIEW COMPARISON:  None. FINDINGS: There is a large amount of fecal material throughout the colon. There is a collection of amorphous gas in the midline mid upper  abdomen on the supine film. On the decubitus film there is some air outlining the lateral margin of the liver. There is associated air-fluid level. While this may be an a distended loop of bowel, free intraperitoneal air cannot be excluded and there may be some free fluid in the abdomen is well. CT of the abdomen and pelvis would be needed to exclude perforated viscus. No abnormal calcifications are seen. Diffuse degenerative disease of the lumbar spine present. IMPRESSION: Large amount of fecal material in the colon. Amorphous gas in the midline mid to upper abdomen on the supine film. On the decubitus film, there is an elongated air-fluid level with visualization of the lateral margin of the liver. Free intraperitoneal air cannot be excluded as well as free fluid in the peritoneal cavity. Recommend correlation with CT of the abdomen and pelvis. These results were called by telephone at the time of interpretation on 11/26/2016 at 11:48 pm to [facility], PA-C, who verbally acknowledged these results. Electronically Signed   By: Aletta Edouard M.D.   On: 11/26/2016 23:49   Dg Swallowing Func-speech Pathology  Result Date: 12/13/2016 Objective Swallowing Evaluation: Type of Study: MBS-Modified Barium Swallow Study Patient Details Name: Adeeb Konecny MRN: [id number]  Date of Birth: 1945/04/28 Today's Date: 12/13/2016 Time: SLP Start Time (ACUTE ONLY): 1530-SLP Stop Time (ACUTE ONLY): 1540 SLP Time Calculation (min) (ACUTE ONLY): 10 min Past Medical History: Past Medical History: Diagnosis Date . ANXIETY 02/04/2009 . CHF (congestive heart failure) (HCC)  . Chronic systolic CHF (congestive heart failure) (HCC)  . DIABETES MELLITUS, TYPE II 11/07/2008 . 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 HIP PAIN, RIGHT 11/06/2009 . HYPERLIPIDEMIA 11/07/2008 . HYPERTENSION 10/08/2008 . Nonischemic cardiomyopathy [facility])   s/p ICD implantation 03-2013 by Dr Ladona Ridgel . OSTEOARTHRITIS, KNEE, RIGHT 10/08/2008 . Peripheral arterial  disease (HCC)  Past Surgical History: Past Surgical History: Procedure Laterality Date . CARDIAC DEFIBRILLATOR PLACEMENT Left 04/10/13  BTK single chamber ICD implanted by Dr Ladona Ridgel for primary prevention . EMBOLECTOMY Right 11/30/2016  Procedure: BRACHIAL EMBOLECTOMY WITH VEIN PATCH ANGIOPLASTY;  Surgeon: Sherren Kerns, MD;  Location: [facility] OR;  Service: Vascular;  Laterality: Right; . IMPLANTABLE CARDIOVERTER DEFIBRILLATOR IMPLANT N/A 04/10/2013  Procedure: IMPLANTABLE CARDIOVERTER DEFIBRILLATOR IMPLANT;  Surgeon: Marinus Maw, MD;  Location: [facility] CATH LAB;  Service: Cardiovascular;  Laterality: N/A; . LAPAROTOMY N/A 11/27/2016  Procedure: EXPLORATORY LAPAROTOMY abdominal exploration oversew pyloric channel ulcer gram patch repair perforated ulcer;  Surgeon: Ovidio Kin, MD;  Location: [facility] ORS;  Service: General;  Laterality: N/A; . s/p left broken arm with pin    1980's HPI: Pt is a 71 year old man with ischemic cardiomyopathy, DM, HTN, PAD, OSA who was admitted 8/9 with gastric ulcer perforation and peritonitis. He underwent emergent laparotomy with omental patch repair, postoperatively remained in shock requiring epinephrine and Levophed drip. Course c/b acute ischemia of R arm requiring tx from [facility] to [facility] and urgent OR embolectomy, also c/b slow vent wean. He was intubated 8/10-8/23. SLP ordered for swallowing evaluation post-extubation. Subjective: alert, cooperative Assessment / Plan / Recommendation CHL IP CLINICAL IMPRESSIONS 12/13/2016 Clinical Impression Patient presents with severe risk for aspiration in the context of physical deconditioning after prolonged intubation. MBS limited due to pt's severe aspiration risk. Secretions appear to be thick, coating pt's posterior pharynx and laryngeal vestibule, visible via movement as pt breathing, coughing. SLP performed oral care and suctioning. With pureed solid, pt with prolonged oral phase, weak manipulation and delayed oral transit, initiating a weak  pharyngeal swallow with moderately reduced tongue base retraction, minimal hyolaryngeal excursion, absent epiglottic deflection and reduced pharyngeal constriction. This led to retention of 100% of the bolus in vallecue. SLP cued pt for dry swallows, chin tuck, in an effort to facilitate bolus clearance, which were ineffective. As contrast pooled with pt's secretions, material spilled over pt's epiglottis and into the laryngeal vestibule. Pt intermittently sensed penetration/aspiration, though reflexive and cued cough not effective for airway clearance. Recommend pt remain NPO, with ice chips after oral care to facilitate use of swallowing musculature. Patient may benefit from RMT to strengthen cough, swallowing musculature. Will follow up for therapeutic exercise.  SLP Visit Diagnosis Dysphagia, oropharyngeal phase (R13.12) Attention and concentration deficit following -- Frontal lobe and executive function deficit following -- Impact on safety and function Severe aspiration risk   CHL IP TREATMENT RECOMMENDATION 12/13/2016 Treatment Recommendations F/U MBS in --- days (Comment)   Prognosis 12/13/2016 Prognosis for Safe Diet Advancement Good Barriers to Reach Goals Severity of deficits Barriers/Prognosis Comment -- CHL IP DIET RECOMMENDATION 12/13/2016 SLP Diet Recommendations NPO;Ice chips PRN after oral care;Alternative means - temporary Liquid Administration via -- Medication Administration Via alternative means Compensations -- Postural Changes --   CHL IP OTHER RECOMMENDATIONS 12/13/2016 Recommended  Consults -- Oral Care Recommendations Oral care QID;Other (Comment) Other Recommendations Have oral suction available;Remove water pitcher   CHL IP FOLLOW UP RECOMMENDATIONS 12/13/2016 Follow up Recommendations Other (comment)   CHL IP FREQUENCY AND DURATION 12/13/2016 Speech Therapy Frequency (ACUTE ONLY) min 2x/week Treatment Duration 2 weeks      CHL IP ORAL PHASE 12/13/2016 Oral Phase Impaired Oral - Pudding Teaspoon  -- Oral - Pudding Cup -- Oral - Honey Teaspoon -- Oral - Honey Cup -- Oral - Nectar Teaspoon -- Oral - Nectar Cup -- Oral - Nectar Straw -- Oral - Thin Teaspoon Lingual pumping;Delayed oral transit;Reduced posterior propulsion;Weak lingual manipulation Oral - Thin Cup -- Oral - Thin Straw -- Oral - Puree Lingual pumping;Delayed oral transit;Piecemeal swallowing;Reduced posterior propulsion;Weak lingual manipulation Oral - Mech Soft -- Oral - Regular -- Oral - Multi-Consistency -- Oral - Pill -- Oral Phase - Comment --  CHL IP PHARYNGEAL PHASE 12/13/2016 Pharyngeal Phase Impaired Pharyngeal- Pudding Teaspoon -- Pharyngeal -- Pharyngeal- Pudding Cup -- Pharyngeal -- Pharyngeal- Honey Teaspoon -- Pharyngeal -- Pharyngeal- Honey Cup -- Pharyngeal -- Pharyngeal- Nectar Teaspoon -- Pharyngeal -- Pharyngeal- Nectar Cup -- Pharyngeal -- Pharyngeal- Nectar Straw -- Pharyngeal -- Pharyngeal- Thin Teaspoon Delayed swallow initiation-vallecula;Reduced pharyngeal peristalsis;Reduced epiglottic inversion;Reduced anterior laryngeal mobility;Reduced laryngeal elevation;Reduced airway/laryngeal closure;Reduced tongue base retraction;Penetration/Aspiration before swallow;Penetration/Apiration after swallow;Trace aspiration;Pharyngeal residue - valleculae Pharyngeal Material enters airway, passes BELOW cords and not ejected out despite cough attempt by patient Pharyngeal- Thin Cup -- Pharyngeal -- Pharyngeal- Thin Straw -- Pharyngeal -- Pharyngeal- Puree Delayed swallow initiation-vallecula;Reduced pharyngeal peristalsis;Reduced epiglottic inversion;Reduced anterior laryngeal mobility;Reduced laryngeal elevation;Reduced airway/laryngeal closure;Reduced tongue base retraction;Penetration/Apiration after swallow;Trace aspiration;Pharyngeal residue - valleculae;Pharyngeal residue - pyriform Pharyngeal Material enters airway, passes BELOW cords and not ejected out despite cough attempt by patient;Material enters airway, passes BELOW cords  without attempt by patient to eject out (silent aspiration) Pharyngeal- Mechanical Soft -- Pharyngeal -- Pharyngeal- Regular -- Pharyngeal -- Pharyngeal- Multi-consistency -- Pharyngeal -- Pharyngeal- Pill -- Pharyngeal -- Pharyngeal Comment --  CHL IP CERVICAL ESOPHAGEAL PHASE 12/13/2016 Cervical Esophageal Phase Impaired Pudding Teaspoon -- Pudding Cup -- Honey Teaspoon -- Honey Cup -- Nectar Teaspoon -- Nectar Cup -- Nectar Straw -- Thin Teaspoon -- Thin Cup -- Thin Straw -- Puree Reduced cricopharyngeal relaxation Mechanical Soft -- Regular -- Multi-consistency -- Pill -- Cervical Esophageal Comment -- Deneise Lever, MS, CCC-SLP Speech-Language Pathologist [phone] No flowsheet data found. Aliene Altes 12/13/2016, 4:32 PM              Ct Angio Chest/abd/pel For Dissection W And/or Wo Contrast  Result Date: 11/27/2016 CLINICAL DATA:  Acute onset of right lower quadrant abdominal pain, nausea and vomiting. Generalized abdominal distention. Elevated lipase and lactic acid. Initial encounter. EXAM: CT ANGIOGRAPHY CHEST, ABDOMEN AND PELVIS TECHNIQUE: Multidetector CT imaging through the chest, abdomen and pelvis was performed using the standard protocol during bolus administration of intravenous contrast. Multiplanar reconstructed images and MIPs were obtained and reviewed to evaluate the vascular anatomy. CONTRAST:  80 mL of Isovue 370 IV contrast COMPARISON:  Chest radiograph performed 11/26/2016 FINDINGS: CTA CHEST FINDINGS Cardiovascular: There is no evidence of aortic dissection. There is no evidence of aneurysmal dilatation. Scattered calcification is noted along the aortic arch. There is no evidence of central pulmonary embolus. The heart is enlarged. Scattered coronary artery calcifications are seen. An AICD is noted at the left chest wall, with a single lead ending at the right ventricle. Mediastinum/Nodes: No mediastinal lymphadenopathy is seen. No pericardial effusion  is identified. The visualized  portions of the thyroid gland are unremarkable. No axillary lymphadenopathy is appreciated. Lungs/Pleura: Trace right-sided pleural fluid is noted. Patchy bibasilar airspace opacities likely reflect atelectasis. No pneumothorax is seen. No masses are identified. Musculoskeletal: No acute osseous abnormalities are identified. The visualized musculature is unremarkable in appearance. Review of the MIP images confirms the above findings. CTA ABDOMEN AND PELVIS FINDINGS VASCULAR Aorta: There is no evidence of aortic dissection. There is no evidence of aneurysmal dilatation. Scattered calcification is seen along the abdominal aorta and its branches. Celiac: The celiac trunk appears intact. SMA: The superior mesenteric artery remains patent, with scattered calcification about the origin of the superior mesenteric artery. Renals: Mild calcification is noted about the proximal renal arteries bilaterally. The renal arteries remain patent. Two left-sided renal arteries are seen. IMA: The inferior mesenteric artery appears grossly patent. Inflow: Scattered calcification is seen along the common and internal iliac arteries bilaterally, and along the common femoral arteries and their branches. Veins: Visualized venous structures are grossly unremarkable. Review of the MIP images confirms the above findings. NON-VASCULAR Hepatobiliary: The liver is unremarkable in appearance. The gallbladder is unremarkable. The common bile duct remains normal in caliber. Pancreas: The pancreas is within normal limits. Spleen: The spleen is unremarkable in appearance. Adrenals/Urinary Tract: The adrenal glands are unremarkable in appearance. Nonspecific perinephric stranding noted bilaterally. There is no evidence of hydronephrosis. No renal or ureteral stones are identified. Bilateral renal cysts are seen. Stomach/Bowel: Bowel perforation is noted, with a large amount of free air and free fluid noted throughout the abdomen and pelvis. A tiny  focus of air along the pylorus and proximal duodenum raises question for the site of perforation. This was better characterized on subsequent surgery. Scattered diverticulosis is noted along the ascending colon, without evidence of diverticulitis. The colon is otherwise grossly unremarkable, though it is surrounded by free fluid and air. There is vague mild soft tissue inflammation about the proximal ileum at the left mid abdomen. Lymphatic: No retroperitoneal or pelvic sidewall lymphadenopathy is seen. Reproductive: The bladder is decompressed. Vague soft tissue inflammation about the bladder may be reactive in nature, or could reflect mild cystitis. A Foley catheter is noted in expected position. The prostate is borderline enlarged, measuring 4.9 cm in transverse dimension. Other: Small to moderate bilateral inguinal hernias are noted, containing only fat. Musculoskeletal: No acute osseous abnormalities are identified. The visualized musculature is unremarkable in appearance. Review of the MIP images confirms the above findings. IMPRESSION: 1. Bowel perforation, with a large amount of free air and free fluid noted throughout the abdomen and pelvis. Tiny focus of air along the pylorus and proximal duodenum raises question for the site of perforation. This was better characterized on subsequent surgery. 2. Vague mild soft tissue inflammation about the proximal ileum at the left mid abdomen. 3. No evidence of aortic dissection. No evidence of aneurysmal dilatation. Scattered aortic atherosclerosis. 4. No evidence of central pulmonary embolus. 5. Vague soft tissue inflammation about the bladder may be reactive in nature, or could reflect mild cystitis. 6. Cardiomegaly.  Scattered coronary artery calcification noted. 7. Trace right-sided pleural fluid. Patchy bibasilar airspace opacities likely reflect atelectasis. 8. Scattered diverticulosis along the ascending colon, without evidence of diverticulitis. 9. Borderline  enlarged prostate. 10. Small to moderate bilateral inguinal hernias, containing only fat. Critical Value/emergent results were discussed in person at the time of interpretation on 11/27/2016 at 2:35 am with the OR nursing staff prior to planned surgery, who verbally acknowledged  these results. Electronically Signed   By: Garald Balding M.D.   On: 11/27/2016 05:35   US Abdomen Limited Ruq  Result Date: 12/02/2016 CLINICAL DATA:  Acute onset of transaminitis.  Initial encounter. EXAM: ULTRASOUND ABDOMEN LIMITED RIGHT UPPER QUADRANT COMPARISON:  None. FINDINGS: Gallbladder: Mild echogenic sludge and scattered tiny stones are noted within the gallbladder. No significant gallbladder wall thickening or pericholecystic fluid is seen. No ultrasonographic Murphy's sign is elicited. Common bile duct: Diameter: 0.3 cm, within normal limits in caliber. Liver: No focal lesion identified. Somewhat coarsened echotexture may reflect fatty infiltration. IMPRESSION: 1. No acute abnormality seen at the right upper quadrant. 2. Suggestion of fatty infiltration within the liver. 3. Tiny stones and mild sludge within the gallbladder. No definite evidence for obstruction or cholecystitis. Electronically Signed   By: Garald Balding M.D.   On: 12/02/2016 01:07    Labs:  CBC:  Recent Labs  12/20/16 0540 12/21/16 0803 12/21/16 2103 12/22/16 0514  WBC 9.2 8.5 7.3 7.1  HGB 9.6* 9.1* 8.4* 8.4*  HCT 31.1* 29.5* 27.3* 27.4*  PLT 241 215 247 246    COAGS:  Recent Labs  11/26/16 2315 11/30/16 1036  INR 1.23 1.35  APTT  --  36    BMP:  Recent Labs  12/20/16 0540 12/21/16 0421 12/21/16 2103 12/22/16 0514  NA 149* 147* 144 147*  K 3.9 3.7 4.1 4.1  CL 118* 117* 113* 115*  CO2 '22 22 24 24  '$ GLUCOSE 175* 195* 100* 107*  BUN 32* 36* 33* 36*  CALCIUM 7.5* 7.5* 7.4* 7.5*  CREATININE 0.91 0.94 0.94 1.02  GFRNONAA >60 >60 >60 >60  GFRAA >60 >60 >60 >60    LIVER FUNCTION TESTS:  Recent Labs  12/17/16 0427  12/20/16 0540 12/21/16 0421 12/22/16 0514  BILITOT 0.7 0.6 0.8 0.7  AST 65* 46* 201* 149*  ALT 47 48 96* 125*  ALKPHOS 60 64 155* 156*  PROT 5.9* 6.0* 5.8* 6.0*  ALBUMIN 1.8* 1.7* 1.6* 1.6*    TUMOR MARKERS: No results for input(s): AFPTM, CEA, CA199, CHROMGRNA in the last 8760 hours.  Assessment and Plan:  Intra abd abscess Post op perforated duodenal ulcer Scheduled now for drain placement Risks and benefits discussed with the patient's daughter including bleeding, infection, damage to adjacent structures, bowel perforation/fistula connection, and sepsis. All of her questions were answered, she is agreeable to proceed. Consent signed and in chart.   Thank you for this interesting consult.  I greatly enjoyed meeting Shail Urbas and look forward to participating in their care.  A copy of this report was sent to the requesting provider on this date.  Electronically Signed: Lavonia Drafts, PA-C 12/22/2016, 2:11 PM   I spent a total of 40 Minutes    in face to face in clinical consultation, greater than 50% of which was counseling/coordinating care for abd abscess drain placement

## 2016-12-22 NOTE — Progress Notes (Signed)
PHARMACY - PHYSICIAN COMMUNICATION CRITICAL VALUE ALERT - BLOOD CULTURE IDENTIFICATION (BCID)  Results for orders placed or performed during the hospital encounter of 11/26/16  Blood Culture ID Panel (Reflexed) (Collected: 12/21/2016  6:32 PM)  Result Value Ref Range   Enterococcus species NOT DETECTED NOT DETECTED   Listeria monocytogenes NOT DETECTED NOT DETECTED   Staphylococcus species DETECTED (A) NOT DETECTED   Staphylococcus aureus NOT DETECTED NOT DETECTED   Methicillin resistance DETECTED (A) NOT DETECTED   Streptococcus species NOT DETECTED NOT DETECTED   Streptococcus agalactiae NOT DETECTED NOT DETECTED   Streptococcus pneumoniae NOT DETECTED NOT DETECTED   Streptococcus pyogenes NOT DETECTED NOT DETECTED   Acinetobacter baumannii NOT DETECTED NOT DETECTED   Enterobacteriaceae species NOT DETECTED NOT DETECTED   Enterobacter cloacae complex NOT DETECTED NOT DETECTED   Escherichia coli NOT DETECTED NOT DETECTED   Klebsiella oxytoca NOT DETECTED NOT DETECTED   Klebsiella pneumoniae NOT DETECTED NOT DETECTED   Proteus species NOT DETECTED NOT DETECTED   Serratia marcescens NOT DETECTED NOT DETECTED   Haemophilus influenzae NOT DETECTED NOT DETECTED   Neisseria meningitidis NOT DETECTED NOT DETECTED   Pseudomonas aeruginosa NOT DETECTED NOT DETECTED   Candida albicans NOT DETECTED NOT DETECTED   Candida glabrata NOT DETECTED NOT DETECTED   Candida krusei NOT DETECTED NOT DETECTED   Candida parapsilosis NOT DETECTED NOT DETECTED   Candida tropicalis NOT DETECTED NOT DETECTED    Name of physician (or Provider) Contacted: Merdis Delay, NP   Changes to prescribed antibiotics required: GPCs in clusters on stain with BCID detecting the above. Patient had new fever today at 102.6. Start IV vancomycin for possible sepsis picture per provider.   York Cerise, PharmD Clinical Pharmacist 12/22/16 11:21 PM

## 2016-12-22 NOTE — Progress Notes (Signed)
Shift event note:  Notified by RN on several occasions regarding pt's persistent hypotension (currently 86/26 MAP 46). Order placed for 500 cc IVFB over 2 hours.  Received call from P.Mikey Bussing, NP w/ [facility] service. He has just placed a CVL d/t limited/no access. He also is concerned pt may be deteriorating as evidenced by persistent hypotension. Awaiting labs obtained at the time of CVL placement.. At bedside pt noted resting in bed in NAD. He awakens easily and is oriented to self. He denies pain or other c/o's and can follow simple commands. Skin is warm and dry. Pt's mentation appears to be baseline for him and he has good UOP. RR intermittently up to 40-45 w/o overt resp distress then will return to low 30's. 02 sats remain 100% on 3L [place]. HR in the 80's and pt remains afebrile. BBS with fine crackles at the bases R>L. Abd noted with large dressing that is CDI. Pt is for possible drain placement by IR per surgical team note. BP somewhat improved after bolus. Lactate 1.1, WBC 7.3, Hb 8.4. Assessment/Plan: 1. Persistent hypotension: Though improved with small IVFB, extensive fluid resuscitation not an option. 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 RN to notify for changes in mentation, decreased UOP or any acute changes. Will continue to monitor closely in SDU.   Leanne Chang, NP-C [facility] Pager [phone]

## 2016-12-22 NOTE — Progress Notes (Signed)
No charge note  PMT GOC meeting scheduled with family on 9/5 at 2 pm.  Norvel Richards, PA-C Palliative Medicine Pager: [phone]

## 2016-12-23 ENCOUNTER — Inpatient Hospital Stay (HOSPITAL_COMMUNITY): Payer: Medicare PPO

## 2016-12-23 DIAGNOSIS — I5021 Acute systolic (congestive) heart failure: Secondary | ICD-10-CM

## 2016-12-23 DIAGNOSIS — Z7189 Other specified counseling: Secondary | ICD-10-CM

## 2016-12-23 DIAGNOSIS — Z515 Encounter for palliative care: Secondary | ICD-10-CM

## 2016-12-23 DIAGNOSIS — J9811 Atelectasis: Secondary | ICD-10-CM

## 2016-12-23 LAB — COMPREHENSIVE METABOLIC PANEL
ALBUMIN: 1.7 g/dL — AB (ref 3.5–5.0)
ALK PHOS: 143 U/L — AB (ref 38–126)
ALT: 142 U/L — ABNORMAL HIGH (ref 17–63)
ANION GAP: 10 (ref 5–15)
AST: 161 U/L — AB (ref 15–41)
BUN: 36 mg/dL — AB (ref 6–20)
CALCIUM: 7.4 mg/dL — AB (ref 8.9–10.3)
CO2: 23 mmol/L (ref 22–32)
Chloride: 113 mmol/L — ABNORMAL HIGH (ref 101–111)
Creatinine, Ser: 1.31 mg/dL — ABNORMAL HIGH (ref 0.61–1.24)
GFR calc Af Amer: >60 mL/min (ref >60)
GFR calc non Af Amer: 52 mL/min — ABNORMAL LOW (ref >60)
GLUCOSE: 135 mg/dL — AB (ref 65–99)
POTASSIUM: 4.1 mmol/L (ref 3.5–5.1)
SODIUM: 146 mmol/L — AB (ref 135–145)
Total Bilirubin: 0.9 mg/dL (ref 0.3–1.2)
Total Protein: 6.2 g/dL — ABNORMAL LOW (ref 6.5–8.1)

## 2016-12-23 LAB — GLUCOSE, CAPILLARY
GLUCOSE-CAPILLARY: 118 mg/dL — AB (ref 65–99)
GLUCOSE-CAPILLARY: 171 mg/dL — AB (ref 65–99)
GLUCOSE-CAPILLARY: 164 mg/dL — AB (ref 65–99)
Glucose-Capillary: 134 mg/dL — ABNORMAL HIGH (ref 65–99)
Glucose-Capillary: 119 mg/dL — ABNORMAL HIGH (ref 65–99)
Glucose-Capillary: 78 mg/dL (ref 65–99)

## 2016-12-23 LAB — CBC
HCT: 28.1 % — ABNORMAL LOW (ref 39.0–52.0)
Hemoglobin: 8.6 g/dL — ABNORMAL LOW (ref 13.0–17.0)
MCH: 32.6 pg (ref 26.0–34.0)
MCHC: 30.6 g/dL (ref 30.0–36.0)
MCV: 106.4 fL — ABNORMAL HIGH (ref 78.0–100.0)
PLATELETS: 291 10*3/uL (ref 150–400)
RBC: 2.64 MIL/uL — ABNORMAL LOW (ref 4.22–5.81)
RDW: 15.5 % (ref 11.5–15.5)
WBC: 7.4 10*3/uL (ref 4.0–10.5)

## 2016-12-23 LAB — PROTIME-INR
INR: 1.28
Prothrombin Time: 15.9 seconds — ABNORMAL HIGH (ref 11.4–15.2)

## 2016-12-23 LAB — APTT: aPTT: 25 seconds (ref 24–36)

## 2016-12-23 LAB — PHOSPHORUS: PHOSPHORUS: 4.4 mg/dL (ref 2.5–4.6)

## 2016-12-23 MED ORDER — RIVAROXABAN 20 MG PO TABS: 20.0000 mg | ORAL_TABLET | Freq: Every day | ORAL | Status: DC

## 2016-12-23 MED ORDER — FENTANYL CITRATE (PF) 100 MCG/2ML IJ SOLN
25.0000 ug | INTRAMUSCULAR | Status: DC | PRN
  Administered 2016-12-23 – 2016-12-27 (×2): 25 ug via INTRAVENOUS
  Filled 2016-12-23 (×2): qty 2

## 2016-12-23 MED ORDER — LIDOCAINE-EPINEPHRINE 1 %-1:100000 IJ SOLN
INTRAMUSCULAR | Status: AC
  Filled 2016-12-23: qty 1

## 2016-12-23 MED ORDER — FUROSEMIDE 10 MG/ML IJ SOLN
20.0000 mg | Freq: Once | INTRAMUSCULAR | Status: AC
  Administered 2016-12-23: 20 mg via INTRAVENOUS
  Filled 2016-12-23: qty 2

## 2016-12-23 MED ORDER — VANCOMYCIN HCL 10 G IV SOLR
1750.0000 mg | Freq: Once | INTRAVENOUS | Status: AC
  Administered 2016-12-23: 1750 mg via INTRAVENOUS
  Filled 2016-12-23: qty 1750

## 2016-12-23 MED ORDER — VANCOMYCIN HCL IN DEXTROSE 750-5 MG/150ML-% IV SOLN
750.0000 mg | Freq: Two times a day (BID) | INTRAVENOUS | Status: DC
  Administered 2016-12-23 – 2016-12-25 (×4): 750 mg via INTRAVENOUS
  Filled 2016-12-23 (×4): qty 150

## 2016-12-23 NOTE — Progress Notes (Signed)
Patient has decided that he and his family are likely going to be pursuing palliative care.  They have a meeting scheduled for today, but do not want to pursue drain placement at this time.  We will hold off and if plan changes for placement of a drain please reorder/call if needed.  Jesson Foskey E 9:42 AM 12/23/2016

## 2016-12-23 NOTE — Progress Notes (Signed)
PROGRESS NOTE    Jacob Pittman  QQV:[id number] DOB: December 29, 1945 DOA: 11/26/2016 PCP: Biagio Borg, MD   Brief Narrative: Jacob Pittman is a 71 y.o. 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 Was found to have a perforated pyloric channel ulcer and pneumoperitonitis s/p omental patch repair subsequently on epinephrine and Levophed.  8/12-  TTE LV moderately dilated with EF 15% &diffuse hypokinesis. There is akinesis of the inferolateral and inferior myocardium.  E coli UTI noted 8/13-  right brachial A- line placed and then developed ischemic right arm- vascular consult and transfer from [facility] to [facility]- thrombectomy of brachial artery- heparin Paroxysmal A-fib also noted 8/14- Decreased attenuation involving the right dentate nucleus in the right cerebellum and immediately adjacent cerebellar parenchyma, concerning for recent and potentially acute infarct in this area.  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- anticoagulation recommended to be stopped for 10-14 days - H pyloli + started on triple therapy - Transaminitis thought to be du to Diflucan hepatotoxicity 8/17 - febrile again and vasopressors resumed 8/23- extubated 8/25 A-fib with HR up to 160s, fever 101.6 at 4 AM- care transferred to [facility] 9/3 - stool noted to be coming out of wound   Assessment & Plan:   Principal Problem:   Perforated duodenal bulb ulcer ([place]) Active Problems:   Acute on chronic combined systolic and diastolic CHF (congestive heart failure) ([place])   Nonischemic cardiomyopathy (HCC)   Pneumoperitoneum   Acute respiratory failure with hypoxia (HCC)   Paroxysmal A-fib ([place])   CVA (cerebral vascular accident) ([place])   Arterial thrombosis ([place])   H pylori ulcer   Fatty infiltration of liver   DM (diabetes mellitus), type 2 (HCC)   Pressure sore on heel, left, unstageable (HCC)   Poor venous  access   Perforated duodenal bulb ulcer Peritonitis H. Pylori ulcer Septic shock Patient has had a prolonged hospital course secondary to above problems. Patient has received multiple antibiotic therapies. He now has evidence of a abscess tracking along sigmoid colon mesentery/omentum with drainage through the wound, evidence of wound necrosis and concern for pending dehiscence. -General surgery recommendations -Palliative care recommendations: goals of care discussion planned for today (12/23/16) -Interventional radiology recommendations: drain  Tachypnea Pulmonary edema on imaging. EF of 15% -lasix  Acute on chronic systolic heart failure EF of 15%. Given IV fluids. Weight is up about 8 lbs over the last 5 days. -lasix 67m IV secondary to low blood pressure -strict in/out  Paroxysmal atrial fibrillation -continue metoprolol  Bilateral basilar atelectasis -continue to encourage incentive spirometry  Hypernatremia Improved slightly  Acute respiratory failure with hypoxia Previously intubated and successfully extubated. Secondary to sepsis.  CVA Likely embolic from atrial fibrillation -continue Xarelto -continue aspirin  Arterial thrombosis Right brachial artery. Secondary to A-line. S/p thrombectomy  Bilateral arm edema No DVT on ultrasound -Lasix as above  Fatty liver  Diabetes mellitus, type 2 A1c of 5.8% -continue SSI   DVT prophylaxis: Xarelto Code Status: DNR Family Communication: None at bedside Disposition Plan: Discharge pending goals of care   Consultants:   General surgery  Interventional radiology  Palliative care medicine  Procedures:   8/10- ex lap- patch repair of pyloric ucler  8/13- brachial embolectomy 2 D ECHO Left ventricle: The cavity size was moderately dilated. Wallthickness was normal. Systolic function was severely reduced. Theestimated ejection fraction was 15%. Diffuse hypokinesis. Thereis akinesis of the  inferolateral and inferior myocardium. Thestudy is not technically sufficient to allow evaluation of LVdiastolic function. - Aortic valve: There was mild regurgitation. - Mitral valve: There was moderate regurgitation. - Left atrium: The atrium was severely dilated. - Right ventricle: The cavity size was moderately dilated. Systolicfunction was moderately reduced. - Right atrium: The atrium was severely dilated. - Tricuspid valve: There was moderate regurgitation. - Pulmonary arteries: Systolic pressure was moderately increased.  Antimicrobials:  Amoxicillin  Unasyn  Clarithromycin  Metronidazole  Zosyn  Vancomycin    Subjective: Mild dyspnea. No chest pain. No abdominal pain.  Objective: Vitals:   12/23/16 0400 12/23/16 0500 12/23/16 0600 12/23/16 0700  BP: (!) 121/39 (!) 164/32 (!) 123/38 103/75  Pulse: (!) 101 (!) 101 95 (!) 105  Resp: (!) 40 (!) 45 (!) 38 (!) 42  Temp: 98.6 F (37 C)     TempSrc: Axillary     SpO2: 99% 96% 96% 97%  Weight:      Height:        Intake/Output Summary (Last 24 hours) at 12/23/16 0803 Last data filed at 12/23/16 0700  Gross per 24 hour  Intake             1820 ml  Output                0 ml  Net             1820 ml   Filed Weights   12/21/16 0602 12/22/16 0500 12/23/16 0355  Weight: 78 kg (171 lb 14.4 oz) 88.7 kg (195 lb 8.8 oz) 88.1 kg (194 lb 3.6 oz)    Examination:  General exam: Appears calm and comfortable Respiratory system: Clear to auscultation with decreased breath sounds bilaterally. Respiratory effort increased with mild supraclavicular retractions. Tachypnea. Cardiovascular system: S1 & S2 heard, RRR. No murmurs. Gastrointestinal system: Abdomen is nondistended, soft and nontender. Abdominal binder in place. Normal bowel sounds heard. Central nervous system: Alert.  Extremities: Bilateral upper extremity edema. No calf tenderness Skin: No cyanosis. No rashes Psychiatry: Judgement and insight appear  normal. Mood & affect depressed and flat.     Data Reviewed: I have personally reviewed following labs and imaging studies  CBC:  Recent Labs Lab 12/20/16 0540 12/21/16 0803 12/21/16 2103 12/22/16 0514 12/23/16 0409  WBC 9.2 8.5 7.3 7.1 7.4  NEUTROABS  --   --  5.4  --   --   HGB 9.6* 9.1* 8.4* 8.4* 8.6*  HCT 31.1* 29.5* 27.3* 27.4* 28.1*  MCV 105.1* 106.5* 105.8* 105.8* 106.4*  PLT 241 215 247 246 366   Basic Metabolic Panel:  Recent Labs Lab 12/18/16 0207  12/19/16 2947 12/20/16 0540 12/21/16 0421 12/21/16 2103 12/22/16 0514 12/23/16 0409  NA 152*  --  147* 149* 147* 144 147* 146*  K 3.4*  < > 3.8 3.9 3.7 4.1 4.1 4.1  CL 122*  --  118* 118* 117* 113* 115* 113*  CO2 24  --  21* _0 GLUCOSE 168*  --  175* 175* 195* 100* 107* 135*  BUN 34*  --  30* 32* 36* 33* 36* 36*  CREATININE 0.82  --  0.88 0.91 0.94 0.94 1.02 1.31*  CALCIUM 7.7*  --  7.6* 7.5* 7.5* 7.4* 7.5* 7.4*  MG 2.1  --  2.5* 2.2 2.2  --   --   --   PHOS  --   --   --  2.3*  --   --   --  4.4  < > = values in this interval not displayed. GFR: Estimated Creatinine Clearance: 56.8 mL/min (A) (by C-G formula based on SCr of 1.31 mg/dL (H)). Liver Function Tests:  Recent Labs Lab 12/17/16 0427 12/20/16 0540 12/21/16 0421 12/22/16 0514 12/23/16 0409  AST 65* 46* 201* 149* 161*  ALT 47 48 96* 125* 142*  ALKPHOS 60 64 155* 156* 143*  BILITOT 0.7 0.6 0.8 0.7 0.9  PROT 5.9* 6.0* 5.8* 6.0* 6.2*  ALBUMIN 1.8* 1.7* 1.6* 1.6* 1.7*   No results for input(s): LIPASE, AMYLASE in the last 168 hours. No results for input(s): AMMONIA in the last 168 hours. Coagulation Profile:  Recent Labs Lab 12/23/16 0409  INR 1.28   Cardiac Enzymes: No results for input(s): CKTOTAL, CKMB, CKMBINDEX, TROPONINI in the last 168 hours. BNP (last 3 results) No results for input(s): PROBNP in the last 8760 hours. HbA1C: No results for input(s): HGBA1C in the last 72 hours. CBG:  Recent Labs Lab  12/22/16 1136 12/22/16 1656 12/22/16 2028 12/22/16 2350 12/23/16 0353  GLUCAP 101* 102* 122* 91 134*   Lipid Profile: No results for input(s): CHOL, HDL, LDLCALC, TRIG, CHOLHDL, LDLDIRECT in the last 72 hours. Thyroid Function Tests: No results for input(s): TSH, T4TOTAL, FREET4, T3FREE, THYROIDAB in the last 72 hours. Anemia Panel: No results for input(s): VITAMINB12, FOLATE, FERRITIN, TIBC, IRON, RETICCTPCT in the last 72 hours. Sepsis Labs:  Recent Labs Lab 12/21/16 2103  LATICACIDVEN 1.1    Recent Results (from the past 240 hour(s))  Culture, blood (routine x 2)     Status: None   Collection Time: 12/16/16  4:54 AM  Result Value Ref Range Status   Specimen Description BLOOD LEFT HAND  Final   Special Requests IN PEDIATRIC BOTTLE Blood Culture adequate volume  Final   Culture NO GROWTH 5 DAYS  Final   Report Status 12/21/2016 FINAL  Final  Culture, blood (routine x 2)     Status: None   Collection Time: 12/16/16  4:59 AM  Result Value Ref Range Status   Specimen Description BLOOD LEFT HAND  Final   Special Requests IN PEDIATRIC BOTTLE Blood Culture adequate volume  Final   Culture NO GROWTH 5 DAYS  Final   Report Status 12/21/2016 FINAL  Final  Culture, blood (Routine X 2) w Reflex to ID Panel     Status: None (Preliminary result)   Collection Time: 12/21/16  6:32 PM  Result Value Ref Range Status   Specimen Description BLOOD LEFT HAND  Final   Special Requests   Final    BOTTLES DRAWN AEROBIC ONLY Blood Culture adequate volume   Culture  Setup Time   Final    GRAM POSITIVE COCCI IN CLUSTERS AEROBIC BOTTLE ONLY Organism ID to follow CRITICAL RESULT CALLED TO, READ BACK BY AND VERIFIED WITH: K COOK PHARMD 2304 12/22/16 A BROWNING    Culture GRAM POSITIVE COCCI  Final   Report Status PENDING  Incomplete  Blood Culture ID Panel (Reflexed)     Status: Abnormal   Collection Time: 12/21/16  6:32 PM  Result Value Ref Range Status   Enterococcus species NOT DETECTED NOT  DETECTED Final   Listeria monocytogenes NOT DETECTED NOT DETECTED Final   Staphylococcus species DETECTED (A) NOT DETECTED Final    Comment: Methicillin (oxacillin) resistant coagulase negative staphylococcus. Possible blood culture contaminant (unless isolated from more than one blood culture draw or clinical case suggests pathogenicity). No antibiotic treatment is indicated for blood  culture contaminants. CRITICAL  RESULT CALLED TO, READ BACK BY AND VERIFIED WITH: K COOK PHARMD 5643 12/22/16 A BROWNING    Staphylococcus aureus NOT DETECTED NOT DETECTED Final   Methicillin resistance DETECTED (A) NOT DETECTED Final    Comment: CRITICAL RESULT CALLED TO, READ BACK BY AND VERIFIED WITH: K COOK PHARMD 2304 12/22/16 A BROWNING    Streptococcus species NOT DETECTED NOT DETECTED Final   Streptococcus agalactiae NOT DETECTED NOT DETECTED Final   Streptococcus pneumoniae NOT DETECTED NOT DETECTED Final   Streptococcus pyogenes NOT DETECTED NOT DETECTED Final   Acinetobacter baumannii NOT DETECTED NOT DETECTED Final   Enterobacteriaceae species NOT DETECTED NOT DETECTED Final   Enterobacter cloacae complex NOT DETECTED NOT DETECTED Final   Escherichia coli NOT DETECTED NOT DETECTED Final   Klebsiella oxytoca NOT DETECTED NOT DETECTED Final   Klebsiella pneumoniae NOT DETECTED NOT DETECTED Final   Proteus species NOT DETECTED NOT DETECTED Final   Serratia marcescens NOT DETECTED NOT DETECTED Final   Haemophilus influenzae NOT DETECTED NOT DETECTED Final   Neisseria meningitidis NOT DETECTED NOT DETECTED Final   Pseudomonas aeruginosa NOT DETECTED NOT DETECTED Final   Candida albicans NOT DETECTED NOT DETECTED Final   Candida glabrata NOT DETECTED NOT DETECTED Final   Candida krusei NOT DETECTED NOT DETECTED Final   Candida parapsilosis NOT DETECTED NOT DETECTED Final   Candida tropicalis NOT DETECTED NOT DETECTED Final  Culture, blood (Routine X 2) w Reflex to ID Panel     Status: None  (Preliminary result)   Collection Time: 12/21/16  6:38 PM  Result Value Ref Range Status   Specimen Description BLOOD RIGHT ARM  Final   Special Requests IN PEDIATRIC BOTTLE Blood Culture adequate volume  Final   Culture  Setup Time   Final    GRAM POSITIVE COCCI IN CLUSTERS IN PEDIATRIC BOTTLE CRITICAL VALUE NOTED.  VALUE IS CONSISTENT WITH PREVIOUSLY REPORTED AND CALLED VALUE.    Culture GRAM POSITIVE COCCI  Final   Report Status PENDING  Incomplete  Surgical pcr screen     Status: None   Collection Time: 12/21/16  9:33 PM  Result Value Ref Range Status   MRSA, PCR NEGATIVE NEGATIVE Final   Staphylococcus aureus NEGATIVE NEGATIVE Final    Comment: (NOTE) The Xpert SA Assay (FDA approved for NASAL specimens in patients 71 years of age and older), is one component of a comprehensive surveillance program. It is not intended to diagnose infection nor to guide or monitor treatment.          Radiology Studies: Ct Abdomen Pelvis W Contrast  Addendum Date: 12/21/2016   ADDENDUM REPORT: 12/21/2016 20:15 CONTRAST EXTRAVASATION CONSULTATION: Type of contrast:  Isovue 300 Site of extravasation: Proximal left upper extremity peripheral IV site. Estimated volume of extravasation: 80 ml Area of extravasation scanned with CT? no PATIENT'S SIGNS AND SYMPTOMS Skin blistering/ulceration: no Decrease capillary refill: no Change in skin color: no Decreased motor function or severe tightness: no Decreased pulses distal to site of extravasation: no Altered sensation: no Increasing pain or signs of increased swelling during observation: no TREATMENT Observation period at site: Immediately after the extravasation event by Dr. Polly Cobia. Patient subsequently returned to inpatient floor. Limb elevation and ice / heat pack application recommended. Plastic surgery consulted? no DOCUMENTATION AND FOLLOW-UP Was additional follow up assigned to PA's? no Patient's questions answered? yes Patient is an inpatient and by  report the CT technologist informed the nursing staff taking care of the patient about the IV contrast extravasation event. Electronically  Signed   By: Ilona Sorrel M.D.   On: 12/21/2016 20:15   Result Date: 12/21/2016 CLINICAL DATA:  Perforated duodenal ulcer, status post Phillip Heal patch on 11/27/2016. 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 Abdominal pain. EXAM: CT ABDOMEN AND PELVIS WITH CONTRAST TECHNIQUE: Multidetector CT imaging of the abdomen and pelvis was performed using the standard protocol following bolus administration of intravenous contrast. CONTRAST:  <See Chart> ISOVUE-300 IOPAMIDOL (ISOVUE-300) INJECTION 61% COMPARISON:  12/13/2016 FINDINGS: The patient was unable to raise is all arms, causing streak artifact which reduces diagnostic sensitivity and specificity. Lower chest: Moderate cardiomegaly. AICD lead noted. Moderate right and small left pleural effusions with passive atelectasis. Hepatobiliary: Hypodense lesion posteriorly in the right hepatic lobe is obscured by the streak artifact but appears similar to 12/13/16. Pancreas: Unremarkable Spleen: Unremarkable Adrenals/Urinary Tract: Several small renal cysts appear stable. Adrenal glands normal. Stomach/Bowel: A feeding tube extends through the stomach and duodenum to terminate at about the level of the ligament of Treitz. Again observed are loculations of abnormal gas density along the gastrohepatic ligament, not appreciably changed in position and with marginal walls. There is prominent abnormal wall thickening in the sigmoid colon along a distended loop. Within the adjacent sigmoid colon mesentery and adjacent omentum, there is a 16.7 by 5.6 by 7.0 cm (volume = 340 cm^3) abscess which appears to drain through the omentum to the wound about at image 62/3, and also shown on image 69/8. Moreover, there is a faintly seen potential connection of the proximal sigmoid colon to this abscess cavity on images 17-18 of series 7, with there is a small linear  gas collection tracking from the sigmoid to the collection. This smaller connection to the sigmoid colon is not absolutely certain but is suspected. Orally administered contrast extends through to the rectum. I do not see well-defined extension of oral contrast into the dominant abscess cavity. Vascular/Lymphatic: Aortoiliac atherosclerotic vascular disease. Reproductive: Unremarkable Other: Low-grade mesenteric, omental, and subcutaneous edema. Mildly asymmetric edema along the right flank. Musculoskeletal: Bridging spurring of both sacroiliac joints. Lumbar spondylosis and degenerative disc disease with impingement at L3-4 and L4-5. IMPRESSION: 1. There is an approximately 340 cc abscess tracking along the sigmoid colon mesentery and omentum and draining out through the wound. Questionable connection of this abscess to the thickened and abnormal sigmoid colon. However, we do not demonstrate definite oral contrast extends in from the sigmoid colon into this abscess, and the fistulous connection between the sigmoid colon and the abscess is questionable. 2. Several stable loculations of gas along the gastrohepatic ligament, similar to 12/13/2016. No significant fluid component to these presume small abscesses which have a similar configuration to the prior exam. 3. Stable small fluid density structure along the posterior right hepatic lobe margin, partially obscured by streak artifact. 4. Third spacing of fluid with mesenteric, omental, and subcutaneous edema. 5. Feeding tube extends to the ligament of Treitz. 6. Moderate cardiomegaly. Moderate right and small left pleural effusions with passive atelectasis. Electronically Signed: By: Van Clines M.D. On: 12/21/2016 14:13   Dg Chest Port 1 View  Result Date: 12/22/2016 CLINICAL DATA:  Shortness of Breath EXAM: PORTABLE CHEST 1 VIEW COMPARISON:  12/21/2016 FINDINGS: Left AICD remains in place, unchanged. Cardiomegaly with bilateral airspace opacities, right  greater than left. Small bilateral effusions. Overall air perforation and the right lung worsened since prior study. IMPRESSION: Worsening bilateral airspace disease, right greater than left which could represent asymmetric edema or infection. Small effusions. Electronically Signed   By: Lennette Bihari  [place] M.D.   On: 12/22/2016 08:46   Dg Chest Port 1 View  Result Date: 12/21/2016 CLINICAL DATA:  Central line placement EXAM: PORTABLE CHEST 1 VIEW COMPARISON:  12/21/2016 at 6:12 FINDINGS: There is a new left jugular central line, extending to the low SVC. No pneumothorax. Unchanged cardiomegaly. Grossly intact transvenous cardiac leads. Mild improvement, with partial clearance of central/basilar ground-glass opacities. IMPRESSION: 1. New left jugular central line appears satisfactorily positioned. No pneumothorax. 2. Unchanged cardiomegaly. Partial clearance of central and basilar airspace opacities. Electronically Signed   By: Andreas Newport M.D.   On: 12/21/2016 21:37        Scheduled Meds: . aspirin  81 mg Per Tube Daily  . chlorhexidine   Topical Once  . chlorhexidine gluconate (MEDLINE KIT)  15 mL Mouth Rinse BID  . Chlorhexidine Gluconate Cloth  6 each Topical Daily  . feeding supplement (PRO-STAT SUGAR FREE 64)  30 mL Per Tube BID  . free water  200 mL Per Tube Q3H  . furosemide  20 mg Intravenous Once  . insulin aspart  0-20 Units Subcutaneous Q4H  . lidocaine (PF)  5 mL Intradermal Once  . lidocaine-EPINEPHrine      . mouth rinse  15 mL Mouth Rinse QID  . metoprolol tartrate  50 mg Per Tube BID  . pantoprazole sodium  40 mg Per Tube BID  . rivaroxaban  20 mg Per Tube Q supper  . sodium chloride flush  10-40 mL Intracatheter Q12H   Continuous Infusions: . sodium chloride Stopped (12/14/16 0953)  . sodium chloride 500 mL (12/03/16 0126)  . dextrose 50 mL/hr at 12/23/16 0018  . feeding supplement (VITAL 1.5 CAL) 1,000 mL (12/20/16 0825)  . piperacillin-tazobactam (ZOSYN)  IV 3.375 g  (12/23/16 0603)  . vancomycin       LOS: 26 days     Cordelia Poche, MD [facility] 12/23/2016, 8:03 AM Pager: [phone]  If 7PM-7AM, please contact night-coverage [email or web address] Password [email or web address] 12/23/2016, 8:03 AM

## 2016-12-23 NOTE — Progress Notes (Signed)
Palliative care meeting has been completed.  For now, family would like to proceed with drain placement.  We will plan for this for tomorrow.  Ehan Freas E 4:11 PM 12/23/2016

## 2016-12-23 NOTE — Progress Notes (Signed)
Pharmacy Antibiotic Note  Mahd Click is a 71 y.o. male admitted on 11/26/2016 with abdominal pain, found to have perforated pyloric channel ulcer and pneumoperitonitis. He has a complicated antibiotic history since his admission on 8/10, but is currently only receiving Zosyn. New fevers and culture data suggest possible sepsis.  Pharmacy has been consulted for vancomycin dosing.  Renal function is stable. Estimated normalized CrCl ~ 65-70 mL/min. Will dose conservatively given prolonged inpatient stay.   Plan: Vancomycin 1750 mg IV x1, then 750 mg IV q12hr Zosyn 3.375 g IV q8hr per MD Monitor renal function, clinical picture, and culture data Vanc trough at [facility] and as needed (goal 15-20 mcg/mL)  Height: 6\' 1"  (185.4 cm) Weight: 195 lb 8.8 oz (88.7 kg) IBW/kg (Calculated) : 79.9  Temp (24hrs), Avg:99.8 F (37.7 C), Min:98.3 F (36.8 C), Max:102.6 F (39.2 C)   Recent Labs Lab 12/19/16 0638 12/20/16 0540 12/21/16 0421 12/21/16 0803 12/21/16 2103 12/22/16 0514  WBC 7.1 9.2  --  8.5 7.3 7.1  CREATININE 0.88 0.91 0.94  --  0.94 1.02  LATICACIDVEN  --   --   --   --  1.1  --     Estimated Creatinine Clearance: 72.9 mL/min (by C-G formula based on SCr of 1.02 mg/dL).    No Known Allergies  Antimicrobials this admission: 8/10 Zosyn >> 8/22, 8/25>>8/26, 9/3 >>  8/10 Diflucan >>8/15 8/10 Vancomycin x1, 9/5 >>  8/15 Flagyl >>8/22 8/15 Biaxin >> 8/29 8/22 Amoxicillin>>8/25, 8/26 >> 8/29 8/25 Unasyn >>8/25  Microbiology results: 8/10 BCx: negative 8/10 Abd wound: normal skin flora 8/10 MRSA PCR: neg 8/9 BCx: neg 8/9 UCx: > 100K E coli R to Unaysn/amp, sens to all others 8/10 BCx: Neg 8/17 C. Diff: negative 8/18 Resp Cx: normal flora 8/17 UCx: neg 8/17 BCx: neg 8/23 BCx: neg 8/29 blood x 2 - neg 9/3 blood x2 - GPCs in clusters -BCID: staph species with methicillin resistance   York Cerise, PharmD Clinical Pharmacist 12/23/16 1:15 AM

## 2016-12-23 NOTE — Progress Notes (Signed)
RN just called and stated that family changed their minds and now want to proceed with drain placement for the patient.  He is still NPO and ready for his procedure.  I will realert the staff in IR to this change in plans.  Alka Falwell E 12:12 PM 12/23/2016

## 2016-12-23 NOTE — Plan of Care (Signed)
Problem: Health Behavior/Discharge Planning: Goal: Ability to manage health-related needs will improve Outcome: Progressing Patient's family meeting with palliative team at 2pm 9/5 to discuss goals of care and possibility of changing patient to comfort care. Family made patient a DNR today.   Problem: Problem: Skin/Wound Progression Goal: Adequate nutrition will be maintained Outcome: Progressing Patient NPO for possible drain placement into abscess by IR 9/5. Currently having TID wound changes to abdomen. Wound continues to be malodorous and purulent.

## 2016-12-23 NOTE — Progress Notes (Signed)
SLP Cancellation Note  Patient Details Name: Arlan Obier MRN: [id number] DOB: 1945/06/23   Cancelled treatment:       Reason Eval/Treat Not Completed: Medical issues which prohibited therapy.  NPO/TF on hold in preparation for procedure.     Blenda Mounts [place] 12/23/2016, 12:55 PM

## 2016-12-23 NOTE — Progress Notes (Signed)
PT Cancellation Note  Patient Details Name: Jacob Pittman MRN: [id number] DOB: [birth year]   Cancelled Treatment:    Reason Eval/Treat Not Completed: Medical issues which prohibited therapy. Pt possibly to pursue palliative/comfort care. Will check on after family meeting with palliative.   [name] 12/23/2016, 10:50 AM Skip Mayer PT [phone]

## 2016-12-23 NOTE — Consult Note (Signed)
Consultation Note Date: 12/23/2016   Patient Name: Jacob Pittman  DOB: 02/12/1946  MRN: [id number]  Age / Sex: 71 y.o., male  PCP: Biagio Borg, MD Referring Physician: Mariel Aloe, MD  Reason for Consultation: Establishing goals of care  HPI/Patient Profile: 71 y.o. male  with past medical history of PAD, NICM with ICD, CHF, and anxiety who was admitted on 11/26/2016 with abdominal pain and encephalopathy.  His lactic acid was 9+.  Work up revealed a perforated pyloric channel ulcer and he underwent surgery.  His post op course has been quite complicated with afib RVR, hypotension, transaminitis, difficult wound healing, an inability to eat or stand.  He is quite deconditioned.  He has been found to have a colonic abscess and is going to IR today for abscess drainage.   Clinical Assessment and Goals of Care:  I have reviewed medical records including EPIC notes, labs and imaging, received report from [facility] attending, assessed the patient and then met at the bedside along with his daughters, his nieces and his wife to discuss diagnosis prognosis, GOC, EOL wishes, disposition and options.  I introduced Palliative Medicine as specialized medical care for people living with serious illness. It focuses on providing relief from the symptoms and stress of a serious illness. The goal is to improve quality of life for both the patient and the family.  We discussed a brief life review of the patient. He is a very strong man. 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   His daughters have never seen him sick.  He does not like to be down or limited.  He is not a complainer.  We discussed his initial injury - perforation with contamination of the abdominal cavity - and the complexity of that injury and the difficulty of recovery.  We discussed his very weak heart, the respiratory distress he is  beginning to have and the continued deterioration of the wound.    I attempted to elicit values and goals of care important to the patient.  The family felt he would never want to be down in the bed.  He would not want to live in a facility.  They do not want him to suffer.    The difference between aggressive medical intervention and comfort care was considered in light of the patient's goals of care.  The family each agreed that they feel comfort care - and even [facility] is the way to go at this point to give him some happiness before he passes.  However, they are not quite prepared to make that decision without his buy in - and unfortunately, he is unable to clearly give his opinion on this most difficult question.  After our meeting I discussed the case with IR PA, and CCS MD.  The patient's outlook for recovery is poor.  Dr. Barry Dienes explained the need for his mental status to clear in the next 24 - 48 hours as an indication of improved prognosis.  Questions and concerns were addressed.  The family was encouraged to call with questions or concerns.    Primary Decision Maker:  NEXT OF KIN    SUMMARY OF RECOMMENDATIONS     Move forward with drain placement in colonic abscess.  PMT will check in again tomorrow to monitor progress and continue to support the family in their decision making process.  Will ask chaplain to visit to support patient and family  Code Status/Advance Care Planning:  DNR   Symptom Management:   Added fentanyl PRN for tachypnea  Additional Recommendations (Limitations, Scope, Preferences):  Full Scope Treatment  Palliative Prophylaxis:   Delirium Protocol and Frequent Pain Assessment  Psycho-social/Spiritual:   Desire for further Chaplaincy support: yes  Prognosis: days to weeks depending on goals of care.    Discharge Planning: To Be Determined      Primary Diagnoses: Present on Admission: . Pneumoperitoneum . Perforated duodenal  bulb ulcer (HCC) . Acute on chronic combined systolic and diastolic CHF (congestive heart failure) (HCC)   I have reviewed the medical record, interviewed the patient and family, and examined the patient. The following aspects are pertinent.  Past Medical History:  Diagnosis Date  . ANXIETY 02/04/2009  . CHF (congestive heart failure) (HCC)   . Chronic systolic CHF (congestive heart failure) (HCC)   . DIABETES MELLITUS, TYPE II 11/07/2008  . 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 HIP PAIN, RIGHT 11/06/2009  . HYPERLIPIDEMIA 11/07/2008  . HYPERTENSION 10/08/2008  . Nonischemic cardiomyopathy [facility])    s/p ICD implantation 03-2013 by Dr Ladona Ridgel  . OSTEOARTHRITIS, KNEE, RIGHT 10/08/2008  . Peripheral arterial disease (HCC)    Social History   Social History  . Marital status: Legally Separated    Spouse name: N/A  . Number of children: 2  . Years of education: N/A   Occupational History  . retired [employer]    Social History Main Topics  . Smoking status: Former Games developer  . Smokeless tobacco: Never Used  . Alcohol use Yes     Comment: 1 pint daily  . Drug use: No  . Sexual activity: No   Other Topics Concern  . None   Social History Narrative   Married/separated although wife comes to appts. With him   10th grade education - poor reading and writing ability   Incarcerated for 6 months in 2009.   Family History  Problem Relation Age of Onset  . Diabetes Mother   . Hypertension Mother    Scheduled Meds: . aspirin  81 mg Per Tube Daily  . chlorhexidine   Topical Once  . chlorhexidine gluconate (MEDLINE KIT)  15 mL Mouth Rinse BID  . Chlorhexidine Gluconate Cloth  6 each Topical Daily  . feeding supplement (PRO-STAT SUGAR FREE 64)  30 mL Per Tube BID  . free water  200 mL Per Tube Q3H  . insulin aspart  0-20 Units Subcutaneous Q4H  . lidocaine (PF)  5 mL Intradermal Once  . lidocaine-EPINEPHrine      . mouth rinse  15 mL Mouth Rinse QID  . metoprolol tartrate  50  mg Per Tube BID  . pantoprazole sodium  40 mg Per Tube BID  . rivaroxaban  20 mg Per Tube Q supper  . sodium chloride flush  10-40 mL Intracatheter Q12H   Continuous Infusions: . sodium chloride Stopped (12/14/16 0953)  . sodium chloride 500 mL (12/03/16 0126)  . feeding supplement (VITAL 1.5 CAL) 1,000 mL (12/20/16 0825)  . piperacillin-tazobactam (ZOSYN)  IV Stopped (12/23/16 1003)  . vancomycin     PRN Meds:.sodium chloride, acetaminophen (TYLENOL) oral liquid 160 mg/5 mL, artificial tears, fentaNYL (SUBLIMAZE) injection, ondansetron **OR** ondansetron (ZOFRAN) IV, sodium chloride flush No Known Allergies Review of Systems patient unable  Physical Exam  Well developed male, Awake, confused - unable to tell me where he is or why CV rrr resp tachypnea, mild distress   Vital Signs: BP (!) 140/38   Pulse 95   Temp 98 F (36.7 C) (Oral)   Resp (!) 27   Ht '6\' 1"'$  (1.854 m)   Wt 88.1 kg (194 lb 3.6 oz)   SpO2 99%   BMI 25.62 kg/m  Pain Assessment: No/denies pain POSS *See Group Information*: 1-Acceptable,Awake and alert Pain Score: 0-No pain   SpO2: SpO2: 99 % O2 Device:SpO2: 99 % O2 Flow Rate: .O2 Flow Rate (L/min): 2 L/min  IO: Intake/output summary:  Intake/Output Summary (Last 24 hours) at 12/23/16 1432 Last data filed at 12/23/16 1235  Gross per 24 hour  Intake             1460 ml  Output              400 ml  Net             1060 ml    LBM: Last BM Date: 12/21/16 Baseline Weight: Weight: 90.7 kg (200 lb) Most recent weight: Weight: 88.1 kg (194 lb 3.6 oz)     Palliative Assessment/Data:     Time In: 3:00 Time Out: 4:10 Time Total: 70 min. Greater than 50%  of this time was spent counseling and coordinating care related to the above assessment and plan.  Signed by: Florentina Jenny, PA-C Palliative Medicine Pager: [phone]  Please contact Palliative Medicine Team phone at [phone] for questions and concerns.  For individual provider: See  Shea Evans

## 2016-12-23 NOTE — Progress Notes (Signed)
23 Days Post-Op    CC:  Perforated ulcer  Subjective: Nursing has talked with the family and they are not going to go forward with the drain and are moving to Palliative care.   Abdominal binder is in place, no real change with the dressing.   Objective: Vital signs in last 24 hours: Temp:  [98 F (36.7 C)-102.6 F (39.2 C)] 98 F (36.7 C) (09/05 0800) Pulse Rate:  [90-114] 114 (09/05 0800) Resp:  [28-51] 28 (09/05 0800) BP: (81-164)/(32-75) 92/38 (09/05 0800) SpO2:  [94 %-100 %] 100 % (09/05 0800) Weight:  [88.1 kg (194 lb 3.6 oz)] 88.1 kg (194 lb 3.6 oz) (09/05 0355) Last BM Date: 12/21/16  He is afebrile, VSS No labs this AM  Intake/Output from previous day: 09/04 0701 - 09/05 0700 In: 1870 [I.V.:1220; IV Piggyback:650] Out: -  Intake/Output this shift: No intake/output data recorded.  General appearance: alert, no distress and looking around some this AM and said hello, but not much else.   GI: open wound looks like yesterday.  Still malodorus. Dressing in place .  Drainage is still the same.    Lab Results:   Recent Labs  12/22/16 0514 12/23/16 0409  WBC 7.1 7.4  HGB 8.4* 8.6*  HCT 27.4* 28.1*  PLT 246 291    BMET  Recent Labs  12/22/16 0514 12/23/16 0409  NA 147* 146*  K 4.1 4.1  CL 115* 113*  CO2 24 23  GLUCOSE 107* 135*  BUN 36* 36*  CREATININE 1.02 1.31*  CALCIUM 7.5* 7.4*   PT/INR  Recent Labs  12/23/16 0409  LABPROT 15.9*  INR 1.28     Recent Labs Lab 12/17/16 0427 12/20/16 0540 12/21/16 0421 12/22/16 0514 12/23/16 0409  AST 65* 46* 201* 149* 161*  ALT 47 48 96* 125* 142*  ALKPHOS 60 64 155* 156* 143*  BILITOT 0.7 0.6 0.8 0.7 0.9  PROT 5.9* 6.0* 5.8* 6.0* 6.2*  ALBUMIN 1.8* 1.7* 1.6* 1.6* 1.7*     Lipase     Component Value Date/Time   LIPASE 20 12/01/2016 0304     Medications: . aspirin  81 mg Per Tube Daily  . chlorhexidine   Topical Once  . chlorhexidine gluconate (MEDLINE KIT)  15 mL Mouth Rinse BID  .  Chlorhexidine Gluconate Cloth  6 each Topical Daily  . feeding supplement (PRO-STAT SUGAR FREE 64)  30 mL Per Tube BID  . free water  200 mL Per Tube Q3H  . insulin aspart  0-20 Units Subcutaneous Q4H  . lidocaine (PF)  5 mL Intradermal Once  . lidocaine-EPINEPHrine      . mouth rinse  15 mL Mouth Rinse QID  . metoprolol tartrate  50 mg Per Tube BID  . pantoprazole sodium  40 mg Per Tube BID  . rivaroxaban  20 mg Per Tube Q supper  . sodium chloride flush  10-40 mL Intracatheter Q12H   . sodium chloride Stopped (12/14/16 0953)  . sodium chloride 500 mL (12/03/16 0126)  . dextrose 50 mL/hr at 12/23/16 0018  . feeding supplement (VITAL 1.5 CAL) 1,000 mL (12/20/16 0825)  . piperacillin-tazobactam (ZOSYN)  IV 3.375 g (12/23/16 0603)  . vancomycin     Anti-infectives    Start     Dose/Rate Route Frequency Ordered Stop   12/23/16 1800  vancomycin (VANCOCIN) IVPB 750 mg/150 ml premix     750 mg 150 mL/hr over 60 Minutes Intravenous Every 12 hours 12/23/16 0310  12/23/16 0145  vancomycin (VANCOCIN) 1,750 mg in sodium chloride 0.9 % 500 mL IVPB     1,750 mg 250 mL/hr over 120 Minutes Intravenous  Once 12/23/16 0138 12/23/16 0353   12/21/16 1500  piperacillin-tazobactam (ZOSYN) IVPB 3.375 g     3.375 g 12.5 mL/hr over 240 Minutes Intravenous Every 8 hours 12/21/16 1456     12/13/16 1000  amoxicillin (AMOXIL) 250 MG/5ML suspension 1,000 mg     1,000 mg Per Tube Every 12 hours 12/13/16 0935 12/16/16 1833   12/12/16 1900  piperacillin-tazobactam (ZOSYN) IVPB 3.375 g  Status:  Discontinued     3.375 g 12.5 mL/hr over 240 Minutes Intravenous Every 8 hours 12/12/16 1759 12/13/16 0911   12/12/16 1400  Ampicillin-Sulbactam (UNASYN) 3 g in sodium chloride 0.9 % 100 mL IVPB  Status:  Discontinued     3 g 200 mL/hr over 30 Minutes Intravenous Every 6 hours 12/12/16 1215 12/12/16 1753   12/10/16 0600  amoxicillin (AMOXIL) 250 MG/5ML suspension 1,000 mg  Status:  Discontinued     1,000 mg Per Tube  Every 12 hours 12/09/16 1701 12/12/16 1206   12/09/16 1130  amoxicillin (AMOXIL) 250 MG/5ML suspension 1,000 mg  Status:  Discontinued     1,000 mg Oral Every 12 hours 12/09/16 1117 12/09/16 1118   12/09/16 1130  amoxicillin (AMOXIL) 250 MG/5ML suspension 1,000 mg  Status:  Discontinued     1,000 mg Per Tube Every 12 hours 12/09/16 1118 12/09/16 1701   12/04/16 2200  clarithromycin (BIAXIN) tablet 500 mg     500 mg Per Tube Every 12 hours 12/04/16 1107 12/16/16 2158   12/04/16 2000  piperacillin-tazobactam (ZOSYN) IVPB 3.375 g     3.375 g 12.5 mL/hr over 240 Minutes Intravenous Every 8 hours 12/04/16 1339 12/08/16 2359   12/04/16 1600  metroNIDAZOLE (FLAGYL) tablet 500 mg  Status:  Discontinued     500 mg Per Tube Every 8 hours 12/04/16 1107 12/09/16 1117   12/02/16 1100  clarithromycin (BIAXIN) 250 MG/5ML suspension 500 mg  Status:  Discontinued     500 mg Per Tube Every 12 hours 12/02/16 1012 12/04/16 1107   12/02/16 1045  metroNIDAZOLE (FLAGYL) 50 mg/ml oral suspension 500 mg  Status:  Discontinued     500 mg Per Tube 3 times daily 12/02/16 1036 12/04/16 1107   12/02/16 1015  metroNIDAZOLE (FLAGYL) 50 mg/ml oral suspension 250 mg  Status:  Discontinued     250 mg Per Tube 4 times daily 12/02/16 1011 12/02/16 1036   11/28/16 0800  fluconazole (DIFLUCAN) IVPB 200 mg  Status:  Discontinued     200 mg 100 mL/hr over 60 Minutes Intravenous Every 24 hours 11/27/16 0624 12/02/16 1018   11/27/16 0630  fluconazole (DIFLUCAN) IVPB 400 mg     400 mg 100 mL/hr over 120 Minutes Intravenous  Once 11/27/16 0622 11/27/16 0841   11/27/16 0600  piperacillin-tazobactam (ZOSYN) IVPB 3.375 g  Status:  Discontinued     3.375 g 12.5 mL/hr over 240 Minutes Intravenous Every 8 hours 11/27/16 0534 12/04/16 1339   11/27/16 0515  piperacillin-tazobactam (ZOSYN) IVPB 3.375 g  Status:  Discontinued     3.375 g 100 mL/hr over 30 Minutes Intravenous  Once 11/27/16 0510 11/27/16 0518   11/27/16 0030   piperacillin-tazobactam (ZOSYN) IVPB 3.375 g     3.375 g 100 mL/hr over 30 Minutes Intravenous  Once 11/27/16 0016 11/27/16 0755   11/27/16 0030  vancomycin (VANCOCIN) IVPB 1000 mg/200 mL premix  1,000 mg 200 mL/hr over 60 Minutes Intravenous  Once 11/27/16 0016 11/27/16 0756      Assessment/Plan  Perforated pyloric ulcer  S/P EXPLORATORY LAPAROTOMY, GRAHAM PATCH REPAIR8/10/18 Dr. 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 Oneida Alar ? CVA Hypernatremia/hyperchloremia - Free water 200 ml q8h Dysphagia Type II diabetes Acute renal failure - resolving FEN: IV fluids/npo/tube feedings ID:  zosyn 8/9- 12/08/16, Diflucan 8/9-8/15/18, clarithromycin 8/17 - 8/29 Amoxicillin 8/23-29/18 =>>day 15 of antibiotics   Vancomycin restarted 9/5 =>> day 2  Zosyn restarted 9/3 =>> day 3 DVT: SCD/heparin =>>Xarelto per TF last dose last PM    Plan:  Will continue to follow and monitor the  results of the Palliative meeting.  LOS: 26 days    Nataline Basara 12/23/2016 [phone]

## 2016-12-24 ENCOUNTER — Inpatient Hospital Stay (HOSPITAL_COMMUNITY): Payer: Medicare PPO

## 2016-12-24 DIAGNOSIS — E118 Type 2 diabetes mellitus with unspecified complications: Secondary | ICD-10-CM

## 2016-12-24 DIAGNOSIS — T814XXA Infection following a procedure, initial encounter: Secondary | ICD-10-CM

## 2016-12-24 DIAGNOSIS — R7881 Bacteremia: Secondary | ICD-10-CM

## 2016-12-24 DIAGNOSIS — K651 Peritoneal abscess: Secondary | ICD-10-CM

## 2016-12-24 DIAGNOSIS — I5022 Chronic systolic (congestive) heart failure: Secondary | ICD-10-CM

## 2016-12-24 DIAGNOSIS — I110 Hypertensive heart disease with heart failure: Secondary | ICD-10-CM

## 2016-12-24 LAB — BASIC METABOLIC PANEL
ANION GAP: 10 (ref 5–15)
BUN: 50 mg/dL — ABNORMAL HIGH (ref 6–20)
CALCIUM: 7.3 mg/dL — AB (ref 8.9–10.3)
CO2: 23 mmol/L (ref 22–32)
CREATININE: 1.52 mg/dL — AB (ref 0.61–1.24)
Chloride: 113 mmol/L — ABNORMAL HIGH (ref 101–111)
GFR calc non Af Amer: 44 mL/min — ABNORMAL LOW (ref >60)
GFR, EST AFRICAN AMERICAN: 51 mL/min — AB (ref >60)
Glucose, Bld: 91 mg/dL (ref 65–99)
Potassium: 3.8 mmol/L (ref 3.5–5.1)
Sodium: 146 mmol/L — ABNORMAL HIGH (ref 135–145)

## 2016-12-24 LAB — GLUCOSE, CAPILLARY
GLUCOSE-CAPILLARY: 101 mg/dL — AB (ref 65–99)
GLUCOSE-CAPILLARY: 92 mg/dL (ref 65–99)
GLUCOSE-CAPILLARY: 73 mg/dL (ref 65–99)
Glucose-Capillary: 81 mg/dL (ref 65–99)
Glucose-Capillary: 98 mg/dL (ref 65–99)

## 2016-12-24 LAB — CBC
HCT: 27.2 % — ABNORMAL LOW (ref 39.0–52.0)
HEMOGLOBIN: 8.5 g/dL — AB (ref 13.0–17.0)
MCH: 33.3 pg (ref 26.0–34.0)
MCHC: 31.3 g/dL (ref 30.0–36.0)
MCV: 106.7 fL — ABNORMAL HIGH (ref 78.0–100.0)
PLATELETS: 304 10*3/uL (ref 150–400)
RBC: 2.55 MIL/uL — ABNORMAL LOW (ref 4.22–5.81)
RDW: 15.5 % (ref 11.5–15.5)
WBC: 7.9 10*3/uL (ref 4.0–10.5)

## 2016-12-24 MED ORDER — FUROSEMIDE 10 MG/ML IJ SOLN
20.0000 mg | Freq: Once | INTRAMUSCULAR | Status: AC
  Administered 2016-12-24: 20 mg via INTRAVENOUS
  Filled 2016-12-24: qty 2

## 2016-12-24 MED ORDER — MIDAZOLAM HCL 2 MG/2ML IJ SOLN
INTRAMUSCULAR | Status: AC
  Filled 2016-12-24: qty 2

## 2016-12-24 MED ORDER — FENTANYL CITRATE (PF) 100 MCG/2ML IJ SOLN
INTRAMUSCULAR | Status: AC
  Filled 2016-12-24: qty 2

## 2016-12-24 MED ORDER — PANTOPRAZOLE SODIUM 40 MG IV SOLR
40.0000 mg | Freq: Two times a day (BID) | INTRAVENOUS | Status: DC
  Administered 2016-12-24 – 2017-01-01 (×17): 40 mg via INTRAVENOUS
  Filled 2016-12-24 (×17): qty 40

## 2016-12-24 MED ORDER — METOPROLOL TARTRATE 5 MG/5ML IV SOLN
5.0000 mg | Freq: Four times a day (QID) | INTRAVENOUS | Status: DC
  Filled 2016-12-24: qty 5

## 2016-12-24 MED ORDER — LIDOCAINE HCL (PF) 1 % IJ SOLN
INTRAMUSCULAR | Status: AC
  Filled 2016-12-24: qty 30

## 2016-12-24 MED ORDER — MIDAZOLAM HCL 2 MG/2ML IJ SOLN
INTRAMUSCULAR | Status: AC | PRN
  Administered 2016-12-24: 1 mg via INTRAVENOUS

## 2016-12-24 MED ORDER — DEXTROSE-NACL 5-0.9 % IV SOLN
INTRAVENOUS | Status: DC
  Administered 2016-12-24: 18:00:00 via INTRAVENOUS

## 2016-12-24 MED ORDER — SODIUM CHLORIDE 0.9% FLUSH
5.0000 mL | Freq: Three times a day (TID) | INTRAVENOUS | Status: DC
  Administered 2016-12-24 – 2017-01-13 (×42): 5 mL via INTRAVENOUS

## 2016-12-24 MED ORDER — ENOXAPARIN SODIUM 100 MG/ML ~~LOC~~ SOLN
85.0000 mg | Freq: Two times a day (BID) | SUBCUTANEOUS | Status: DC
  Administered 2016-12-24 – 2016-12-31 (×14): 85 mg via SUBCUTANEOUS
  Filled 2016-12-24 (×14): qty 1

## 2016-12-24 MED ORDER — DAKINS (1/4 STRENGTH) 0.125 % EX SOLN
Freq: Two times a day (BID) | CUTANEOUS | Status: AC
  Administered 2016-12-24 – 2016-12-25 (×3): 1
  Administered 2016-12-26 (×2)
  Filled 2016-12-24: qty 473

## 2016-12-24 MED ORDER — RIVAROXABAN 20 MG PO TABS: 20.0000 mg | ORAL_TABLET | Freq: Every day | ORAL | Status: DC

## 2016-12-24 MED ORDER — FENTANYL CITRATE (PF) 100 MCG/2ML IJ SOLN
INTRAMUSCULAR | Status: AC | PRN
  Administered 2016-12-24: 50 ug via INTRAVENOUS

## 2016-12-24 NOTE — Progress Notes (Signed)
Responded to [facility] Consult to visited with patient and family. Family is considering comfort care. Per nurse patient is going for procedure for drain. I spoke with patient's niece Carney Bern) at bedside regarding immediate need and she said none at presence. Chaplain will follow as needed.   12/24/16 1000  Clinical Encounter Type  Visited With Patient and family together;Health care provider  Visit Type Initial;Spiritual support;Pre-op  Referral From Nurse  Spiritual Encounters  Spiritual Needs Emotional  Stress Factors  Family Stress Factors None identified  Fae Pippin, [facility], Pager [phone]

## 2016-12-24 NOTE — Consult Note (Addendum)
[facility] for Infectious Disease  Date of Admission:  11/26/2016  Date of Consult:  12/24/2016  Reason for Consult: MRSE Bacteremia Referring Physician: Lonny Prude  Impression/Recommendation MRSE bacteremia related to IV line, TPN Would continue vanco Would pull line Would repeat BCx sent today Not put in new central line til repeat BCx are clear Could consider TTE to eval his pacer/defib  Perforated gastric ulcer with bowel leak, abscess A culture of this fluid would be useful.   Comment- The above plan for his bacteremia is what is typically done.  If we are not able to pull his line, if he has to have TPN (can he be allowed to have NGT feeds?), can try to "treat though" and give him vanco via his central line. This is a consideration given his poor overall health.  A TEE would typically be indicated due to his bacteremia and his CV device, however due to his poor overall health I would not do this.  He is not a candidate for device removal.   Thank you so much for this interesting consult,   Bobby Rumpf (pager) [phone] [email or web address]  Dallen Bunte is an 71 y.o. male.  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   On 9-3 he was noted to have stool issuing from his abd wound. He was found on CT to have a 340cc abscess. He began to have fever (102.6). He was started on zosyn.  He was eval by palliative care and family decided to be made palliative care on 9-5. IR drain not placed.  Later in the day, the family decided that IR drain would be appropriate and to continue his current level of care.  He underwent drain placement today in IR.  He is now found  to have MRSE in his BCx 2/2.   Past Medical History:  Diagnosis Date  . ANXIETY 02/04/2009  . CHF (congestive heart failure) ([place])   . Chronic systolic CHF (congestive heart failure) ([place])   . DIABETES MELLITUS, TYPE II 11/07/2008  . 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 HIP PAIN, RIGHT 11/06/2009  . HYPERLIPIDEMIA 11/07/2008  . HYPERTENSION 10/08/2008  . Nonischemic cardiomyopathy [facility])    s/p ICD implantation 03-2013 by Dr Lovena Le  . OSTEOARTHRITIS, KNEE, RIGHT 10/08/2008  . Peripheral arterial disease [facility])     Past Surgical History:  Procedure Laterality Date  . CARDIAC DEFIBRILLATOR PLACEMENT Left 04/10/13   BTK single chamber ICD implanted by Dr Lovena Le for primary prevention  . EMBOLECTOMY Right 11/30/2016   Procedure: BRACHIAL EMBOLECTOMY WITH VEIN PATCH ANGIOPLASTY;  Surgeon: Elam Dutch, MD;  Location: [facility];  Service: Vascular;  Laterality: Right;  . IMPLANTABLE CARDIOVERTER DEFIBRILLATOR IMPLANT N/A 04/10/2013   Procedure: IMPLANTABLE CARDIOVERTER DEFIBRILLATOR IMPLANT;  Surgeon: Evans Lance, MD;  Location: [facility] CATH LAB;  Service: Cardiovascular;  Laterality: N/A;  . LAPAROTOMY N/A 11/27/2016   Procedure: EXPLORATORY LAPAROTOMY abdominal exploration oversew pyloric channel ulcer gram patch repair perforated ulcer;  Surgeon: Alphonsa Overall, MD;  Location: [facility];  Service: General;  Laterality: N/A;  . s/p left broken arm with pin     1980's     No Known Allergies  Medications:  Scheduled: . aspirin  81 mg Per Tube Daily  . chlorhexidine   Topical Once  . chlorhexidine gluconate (MEDLINE KIT)  15 mL Mouth Rinse BID  . Chlorhexidine Gluconate Cloth  6 each Topical Daily  . enoxaparin (LOVENOX) injection  85 mg Subcutaneous Q12H  . fentaNYL      . furosemide  20 mg Intravenous Once  . insulin aspart  0-20 Units Subcutaneous Q4H  . lidocaine (PF)  5 mL Intradermal Once  . lidocaine (PF)      . mouth rinse  15 mL Mouth Rinse QID  . metoprolol tartrate  50 mg  Per Tube BID  . midazolam      . pantoprazole (PROTONIX) IV  40 mg Intravenous Q12H  . sodium chloride flush  10-40 mL Intracatheter Q12H  . sodium chloride flush  5 mL Intravenous Q8H  . sodium hypochlorite   Irrigation BID    Abtx:  Anti-infectives    Start     Dose/Rate Route Frequency Ordered Stop   12/23/16 1800  vancomycin (VANCOCIN) IVPB 750 mg/150 ml premix     750 mg 150 mL/hr over 60 Minutes Intravenous Every 12 hours 12/23/16 0310     12/23/16 0145  vancomycin (VANCOCIN) 1,750 mg in sodium chloride 0.9 % 500 mL IVPB     1,750 mg 250 mL/hr over 120 Minutes Intravenous  Once 12/23/16 0138 12/23/16 0353   12/21/16 1500  piperacillin-tazobactam (ZOSYN) IVPB 3.375 g     3.375 g 12.5 mL/hr over 240 Minutes Intravenous Every 8 hours 12/21/16 1456     12/13/16 1000  amoxicillin (AMOXIL) 250 MG/5ML suspension 1,000 mg     1,000 mg Per Tube Every 12 hours 12/13/16 0935 12/16/16 1833   12/12/16 1900  piperacillin-tazobactam (ZOSYN) IVPB 3.375 g  Status:  Discontinued     3.375 g 12.5 mL/hr over 240 Minutes Intravenous Every 8 hours 12/12/16 1759 12/13/16 0911   12/12/16 1400  Ampicillin-Sulbactam (UNASYN) 3 g in sodium chloride 0.9 % 100 mL IVPB  Status:  Discontinued     3 g 200 mL/hr over 30 Minutes Intravenous Every 6 hours 12/12/16 1215 12/12/16 1753   12/10/16 0600  amoxicillin (AMOXIL) 250 MG/5ML suspension 1,000 mg  Status:  Discontinued     1,000 mg Per Tube Every 12 hours 12/09/16 1701 12/12/16 1206   12/09/16 1130  amoxicillin (AMOXIL) 250 MG/5ML suspension 1,000 mg  Status:  Discontinued     1,000 mg Oral Every 12 hours 12/09/16 1117 12/09/16 1118   12/09/16 1130  amoxicillin (AMOXIL) 250 MG/5ML suspension 1,000 mg  Status:  Discontinued     1,000 mg Per Tube Every 12 hours 12/09/16 1118 12/09/16 1701   12/04/16 2200  clarithromycin (BIAXIN) tablet 500 mg     500 mg Per Tube Every 12 hours 12/04/16 1107 12/16/16 2158   12/04/16 2000  piperacillin-tazobactam (ZOSYN) IVPB  3.375 g     3.375 g 12.5 mL/hr over 240 Minutes Intravenous Every 8 hours 12/04/16 1339 12/08/16 2359   12/04/16 1600  metroNIDAZOLE (FLAGYL) tablet 500 mg  Status:  Discontinued     500 mg Per Tube Every 8 hours 12/04/16 1107 12/09/16 1117   12/02/16 1100  clarithromycin (BIAXIN) 250 MG/5ML suspension 500 mg  Status:  Discontinued     500 mg Per Tube Every 12 hours 12/02/16 1012 12/04/16 1107   12/02/16 1045  metroNIDAZOLE (FLAGYL) 50 mg/ml oral suspension 500 mg  Status:  Discontinued     500 mg Per Tube 3 times daily  12/02/16 1036 12/04/16 1107   12/02/16 1015  metroNIDAZOLE (FLAGYL) 50 mg/ml oral suspension 250 mg  Status:  Discontinued     250 mg Per Tube 4 times daily 12/02/16 1011 12/02/16 1036   11/28/16 0800  fluconazole (DIFLUCAN) IVPB 200 mg  Status:  Discontinued     200 mg 100 mL/hr over 60 Minutes Intravenous Every 24 hours 11/27/16 0624 12/02/16 1018   11/27/16 0630  fluconazole (DIFLUCAN) IVPB 400 mg     400 mg 100 mL/hr over 120 Minutes Intravenous  Once 11/27/16 0622 11/27/16 0841   11/27/16 0600  piperacillin-tazobactam (ZOSYN) IVPB 3.375 g  Status:  Discontinued     3.375 g 12.5 mL/hr over 240 Minutes Intravenous Every 8 hours 11/27/16 0534 12/04/16 1339   11/27/16 0515  piperacillin-tazobactam (ZOSYN) IVPB 3.375 g  Status:  Discontinued     3.375 g 100 mL/hr over 30 Minutes Intravenous  Once 11/27/16 0510 11/27/16 0518   11/27/16 0030  piperacillin-tazobactam (ZOSYN) IVPB 3.375 g     3.375 g 100 mL/hr over 30 Minutes Intravenous  Once 11/27/16 0016 11/27/16 0755   11/27/16 0030  vancomycin (VANCOCIN) IVPB 1000 mg/200 mL premix     1,000 mg 200 mL/hr over 60 Minutes Intravenous  Once 11/27/16 0016 11/27/16 0756      Total days of antibiotics:  Vanco 9-5 Zosyn 9-3          Social History:  reports that he has quit smoking. He has never used smokeless tobacco. He reports that he drinks alcohol. He reports that he does not use drugs.  Family History  Problem  Relation Age of Onset  . Diabetes Mother   . Hypertension Mother     History obtained from unobtainable from patient due to mental status  Blood pressure (!) 107/33, pulse 72, temperature 97.9 F (36.6 C), temperature source Oral, resp. rate (!) 30, height '6\' 1"'$  (1.854 m), weight 87.7 kg (193 lb 5.5 oz), SpO2 100 %. 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  Eyes: negative findings: pupils equal, round, reactive to light and accomodation Throat: dry Neck: L neck IJ clean.  Lungs: diminished breath sounds bilaterally Heart: irregularly irregular rhythm Abdomen: normal findings: bowel sounds normal and soft, non-tender and distended. wound partially undressed, foul odor. no gross d/c.  Extremities: edema BUE edema, RUE cool. wounds on BLE   Results for orders placed or performed during the hospital encounter of 11/26/16 (from the past 48 hour(s))  Glucose, capillary     Status: Abnormal   Collection Time: 12/22/16  4:56 PM  Result Value Ref Range   Glucose-Capillary 102 (H) 65 - 99 mg/dL  Glucose, capillary     Status: Abnormal   Collection Time: 12/22/16  8:28 PM  Result Value Ref Range   Glucose-Capillary 122 (H) 65 - 99 mg/dL  Glucose, capillary     Status: None   Collection Time: 12/22/16 11:50 PM  Result Value Ref Range   Glucose-Capillary 91 65 - 99 mg/dL  Glucose, capillary     Status: Abnormal   Collection Time: 12/23/16  3:53 AM  Result Value Ref Range   Glucose-Capillary 134 (H) 65 - 99 mg/dL   Comment 1 Notify RN    Comment 2 Document in Chart   Comprehensive metabolic panel     Status: Abnormal   Collection Time: 12/23/16  4:09 AM  Result Value Ref Range   Sodium 146 (H) 135 - 145 mmol/L  Potassium 4.1 3.5 - 5.1 mmol/L   Chloride 113 (H) 101 - 111 mmol/L   CO2 23 22 - 32 mmol/L   Glucose, Bld 135 (H) 65 - 99 mg/dL   BUN 36 (H) 6 - 20 mg/dL   Creatinine, Ser 1.31 (H) 0.61 - 1.24 mg/dL   Calcium  7.4 (L) 8.9 - 10.3 mg/dL   Total Protein 6.2 (L) 6.5 - 8.1 g/dL   Albumin 1.7 (L) 3.5 - 5.0 g/dL   AST 161 (H) 15 - 41 U/L   ALT 142 (H) 17 - 63 U/L   Alkaline Phosphatase 143 (H) 38 - 126 U/L   Total Bilirubin 0.9 0.3 - 1.2 mg/dL   GFR calc non Af Amer 52 (L) >60 mL/min   GFR calc Af Amer >60 >60 mL/min    Comment: (NOTE) The eGFR has been calculated using the CKD EPI equation. This calculation has not been validated in all clinical situations. eGFR's persistently <60 mL/min signify possible Chronic Kidney Disease.    Anion gap 10 5 - 15  CBC     Status: Abnormal   Collection Time: 12/23/16  4:09 AM  Result Value Ref Range   WBC 7.4 4.0 - 10.5 K/uL   RBC 2.64 (L) 4.22 - 5.81 MIL/uL   Hemoglobin 8.6 (L) 13.0 - 17.0 g/dL   HCT 28.1 (L) 39.0 - 52.0 %   MCV 106.4 (H) 78.0 - 100.0 fL   MCH 32.6 26.0 - 34.0 pg   MCHC 30.6 30.0 - 36.0 g/dL   RDW 15.5 11.5 - 15.5 %   Platelets 291 150 - 400 K/uL  Protime-INR     Status: Abnormal   Collection Time: 12/23/16  4:09 AM  Result Value Ref Range   Prothrombin Time 15.9 (H) 11.4 - 15.2 seconds   INR 1.28   APTT     Status: None   Collection Time: 12/23/16  4:09 AM  Result Value Ref Range   aPTT 25 24 - 36 seconds  Phosphorus     Status: None   Collection Time: 12/23/16  4:09 AM  Result Value Ref Range   Phosphorus 4.4 2.5 - 4.6 mg/dL  Glucose, capillary     Status: Abnormal   Collection Time: 12/23/16  8:09 AM  Result Value Ref Range   Glucose-Capillary 119 (H) 65 - 99 mg/dL   Comment 1 Notify RN   Glucose, capillary     Status: Abnormal   Collection Time: 12/23/16 12:18 PM  Result Value Ref Range   Glucose-Capillary 118 (H) 65 - 99 mg/dL  Glucose, capillary     Status: Abnormal   Collection Time: 12/23/16  4:22 PM  Result Value Ref Range   Glucose-Capillary 171 (H) 65 - 99 mg/dL   Comment 1 Notify RN    Comment 2 Document in Chart   Glucose, capillary     Status: Abnormal   Collection Time: 12/23/16  7:12 PM  Result Value  Ref Range   Glucose-Capillary 164 (H) 65 - 99 mg/dL   Comment 1 Notify RN    Comment 2 Document in Chart   Glucose, capillary     Status: None   Collection Time: 12/23/16 11:09 PM  Result Value Ref Range   Glucose-Capillary 78 65 - 99 mg/dL   Comment 1 Notify RN    Comment 2 Document in Chart   Glucose, capillary     Status: Abnormal   Collection Time: 12/24/16  3:33 AM  Result Value Ref Range  Glucose-Capillary 101 (H) 65 - 99 mg/dL  CBC     Status: Abnormal   Collection Time: 12/24/16  4:48 AM  Result Value Ref Range   WBC 7.9 4.0 - 10.5 K/uL   RBC 2.55 (L) 4.22 - 5.81 MIL/uL   Hemoglobin 8.5 (L) 13.0 - 17.0 g/dL   HCT 09.3 (L) 26.7 - 12.4 %   MCV 106.7 (H) 78.0 - 100.0 fL   MCH 33.3 26.0 - 34.0 pg   MCHC 31.3 30.0 - 36.0 g/dL   RDW 58.0 99.8 - 33.8 %   Platelets 304 150 - 400 K/uL  Glucose, capillary     Status: None   Collection Time: 12/24/16  7:27 AM  Result Value Ref Range   Glucose-Capillary 92 65 - 99 mg/dL  Basic metabolic panel     Status: Abnormal   Collection Time: 12/24/16  9:56 AM  Result Value Ref Range   Sodium 146 (H) 135 - 145 mmol/L   Potassium 3.8 3.5 - 5.1 mmol/L   Chloride 113 (H) 101 - 111 mmol/L   CO2 23 22 - 32 mmol/L   Glucose, Bld 91 65 - 99 mg/dL   BUN 50 (H) 6 - 20 mg/dL   Creatinine, Ser 2.50 (H) 0.61 - 1.24 mg/dL   Calcium 7.3 (L) 8.9 - 10.3 mg/dL   GFR calc non Af Amer 44 (L) >60 mL/min   GFR calc Af Amer 51 (L) >60 mL/min    Comment: (NOTE) The eGFR has been calculated using the CKD EPI equation. This calculation has not been validated in all clinical situations. eGFR's persistently <60 mL/min signify possible Chronic Kidney Disease.    Anion gap 10 5 - 15  Body fluid culture     Status: None (Preliminary result)   Collection Time: 12/24/16 12:13 PM  Result Value Ref Range   Specimen Description FLUID    Special Requests ASCITIES    Gram Stain      RARE WBC PRESENT,BOTH PMN AND MONONUCLEAR NO ORGANISMS SEEN    Culture  PENDING    Report Status PENDING   Glucose, capillary     Status: None   Collection Time: 12/24/16  1:32 PM  Result Value Ref Range   Glucose-Capillary 73 65 - 99 mg/dL      Component Value Date/Time   SDES FLUID 12/24/2016 1213   SPECREQUEST ASCITIES 12/24/2016 1213   CULT PENDING 12/24/2016 1213   REPTSTATUS PENDING 12/24/2016 1213   Dg Chest Port 1 View  Result Date: 12/23/2016 CLINICAL DATA:  Shortness of breath; chronic CHF, cardiomyopathy, peripheral vascular disease EXAM: PORTABLE CHEST 1 VIEW COMPARISON:  Portable chest x-ray of December 22, 2016 FINDINGS: The lungs are reasonably well inflated. Bibasilar densities are less conspicuous today. There are small bilateral pleural effusions. The cardiac silhouette remains enlarged. The pulmonary vascularity is less engorged. The ICD is in stable position. The feeding tube tip projects below the inferior margin of the image. A left internal jugular venous catheter tip projects in the midportion of the SVC. There is calcification in the wall of the aortic arch. IMPRESSION: Decreased pulmonary interstitial edema. Decreased bibasilar atelectasis or pneumonia. Persistent mild CHF with small effusions. Electronically Signed   By: David  [place] M.D.   On: 12/23/2016 08:27   Recent Results (from the past 240 hour(s))  Culture, blood (routine x 2)     Status: None   Collection Time: 12/16/16  4:54 AM  Result Value Ref Range Status   Specimen Description BLOOD  LEFT HAND  Final   Special Requests IN PEDIATRIC BOTTLE Blood Culture adequate volume  Final   Culture NO GROWTH 5 DAYS  Final   Report Status 12/21/2016 FINAL  Final  Culture, blood (routine x 2)     Status: None   Collection Time: 12/16/16  4:59 AM  Result Value Ref Range Status   Specimen Description BLOOD LEFT HAND  Final   Special Requests IN PEDIATRIC BOTTLE Blood Culture adequate volume  Final   Culture NO GROWTH 5 DAYS  Final   Report Status 12/21/2016 FINAL  Final  Culture,  blood (Routine X 2) w Reflex to ID Panel     Status: None (Preliminary result)   Collection Time: 12/21/16  6:32 PM  Result Value Ref Range Status   Specimen Description BLOOD LEFT HAND  Final   Special Requests   Final    BOTTLES DRAWN AEROBIC ONLY Blood Culture adequate volume   Culture  Setup Time   Final    GRAM POSITIVE COCCI IN CLUSTERS AEROBIC BOTTLE ONLY Organism ID to follow CRITICAL RESULT CALLED TO, READ BACK BY AND VERIFIED WITH: K COOK PHARMD [id number] 12/22/16 A BROWNING    Culture GRAM POSITIVE COCCI IDENTIFICATION TO FOLLOW   Final   Report Status PENDING  Incomplete  Blood Culture ID Panel (Reflexed)     Status: Abnormal   Collection Time: 12/21/16  6:32 PM  Result Value Ref Range Status   Enterococcus species NOT DETECTED NOT DETECTED Final   Listeria monocytogenes NOT DETECTED NOT DETECTED Final   Staphylococcus species DETECTED (A) NOT DETECTED Final    Comment: Methicillin (oxacillin) resistant coagulase negative staphylococcus. Possible blood culture contaminant (unless isolated from more than one blood culture draw or clinical case suggests pathogenicity). No antibiotic treatment is indicated for blood  culture contaminants. CRITICAL RESULT CALLED TO, READ BACK BY AND VERIFIED WITH: K COOK PHARMD [id number] 12/22/16 A BROWNING    Staphylococcus aureus NOT DETECTED NOT DETECTED Final   Methicillin resistance DETECTED (A) NOT DETECTED Final    Comment: CRITICAL RESULT CALLED TO, READ BACK BY AND VERIFIED WITH: K COOK PHARMD [id number] 12/22/16 A BROWNING    Streptococcus species NOT DETECTED NOT DETECTED Final   Streptococcus agalactiae NOT DETECTED NOT DETECTED Final   Streptococcus pneumoniae NOT DETECTED NOT DETECTED Final   Streptococcus pyogenes NOT DETECTED NOT DETECTED Final   Acinetobacter baumannii NOT DETECTED NOT DETECTED Final   Enterobacteriaceae species NOT DETECTED NOT DETECTED Final   Enterobacter cloacae complex NOT DETECTED NOT DETECTED Final   Escherichia coli  NOT DETECTED NOT DETECTED Final   Klebsiella oxytoca NOT DETECTED NOT DETECTED Final   Klebsiella pneumoniae NOT DETECTED NOT DETECTED Final   Proteus species NOT DETECTED NOT DETECTED Final   Serratia marcescens NOT DETECTED NOT DETECTED Final   Haemophilus influenzae NOT DETECTED NOT DETECTED Final   Neisseria meningitidis NOT DETECTED NOT DETECTED Final   Pseudomonas aeruginosa NOT DETECTED NOT DETECTED Final   Candida albicans NOT DETECTED NOT DETECTED Final   Candida glabrata NOT DETECTED NOT DETECTED Final   Candida krusei NOT DETECTED NOT DETECTED Final   Candida parapsilosis NOT DETECTED NOT DETECTED Final   Candida tropicalis NOT DETECTED NOT DETECTED Final  Culture, blood (Routine X 2) w Reflex to ID Panel     Status: None (Preliminary result)   Collection Time: 12/21/16  6:38 PM  Result Value Ref Range Status   Specimen Description BLOOD RIGHT ARM  Final   Special Requests IN PEDIATRIC  BOTTLE Blood Culture adequate volume  Final   Culture  Setup Time   Final    GRAM POSITIVE COCCI IN CLUSTERS IN PEDIATRIC BOTTLE CRITICAL VALUE NOTED.  VALUE IS CONSISTENT WITH PREVIOUSLY REPORTED AND CALLED VALUE.    Culture GRAM POSITIVE COCCI  Final   Report Status PENDING  Incomplete  Surgical pcr screen     Status: None   Collection Time: 12/21/16  9:33 PM  Result Value Ref Range Status   MRSA, PCR NEGATIVE NEGATIVE Final   Staphylococcus aureus NEGATIVE NEGATIVE Final    Comment: (NOTE) The Xpert SA Assay (FDA approved for NASAL specimens in patients 71 years of age and older), is one component of a comprehensive surveillance program. It is not intended to diagnose infection nor to guide or monitor treatment.   Body fluid culture     Status: None (Preliminary result)   Collection Time: 12/24/16 12:13 PM  Result Value Ref Range Status   Specimen Description FLUID  Final   Special Requests ASCITIES  Final   Gram Stain   Final    RARE WBC PRESENT,BOTH PMN AND MONONUCLEAR NO  ORGANISMS SEEN    Culture PENDING  Incomplete   Report Status PENDING  Incomplete      12/24/2016, 3:08 PM     LOS: 27 days    Records and images were personally reviewed where available.  Bobby Rumpf, MD [facility] for Infectious Disease [facility] [phone] 12/24/2016, 3:08 PM

## 2016-12-24 NOTE — Progress Notes (Addendum)
Nutrition Consult/Follow Up  DOCUMENTATION CODES:   Obesity unspecified  INTERVENTION:    TPN per pharmacy  NUTRITION DIAGNOSIS:   Inadequate oral intake related to altered GI function as evidenced by NPO status, ongoing  GOAL:   Patient will meet greater than or equal to 90% of their needs, progressing  MONITOR:   Diet advancement, Labs, Weight trends, Skin, I & O's  ASSESSMENT:   71 y.o.M with CHF (EF 15%), HTN, DM, PAD, and OA. Pt admitted with peritonitis and septic shock secondary to perforated ulcer s/p exp lap oversew pyloric channel ulcer, graham omental patch repair for perforated ulcer. Pt transferred to [facility] for brachial embolectomy 8/13.  Pt s/p placement of 10 F fluid drain for postop wound abscess. Spoke with Revonda Standard, PharmD. More than likely plan to start TPN tonight. Awaiting confirmation from MD. TF discontinued. Cortrak small bore feeding tube remains in place. Labs and medications reviewed. Na 146 (H). CBG's [id number].  Diet Order:  Diet NPO time specified  Skin:  Wound (see comment) (stage II pressure injury sacrum)  Last BM:  9/5   Intake/Output Summary (Last 24 hours) at 12/24/16 1346 Last data filed at 12/24/16 1200  Gross per 24 hour  Intake              570 ml  Output              235 ml  Net              335 ml   Height:   Ht Readings from Last 1 Encounters:  12/21/16 6\' 1"  (1.854 m)    Weight:   Wt Readings from Last 1 Encounters:  12/24/16 193 lb 5.5 oz (87.7 kg)    Ideal Body Weight:  70 kg  BMI:  Body mass index is 25.51 kg/m.  Estimated Nutritional Needs:   Kcal:  2020-2265 kcals  Protein:  100-115 g   Fluid:  >/= 2 L  EDUCATION NEEDS:   No education needs identified at this time  Maureen Chatters, RD, LDN Pager #: [phone] After-Hours Pager #: [phone]

## 2016-12-24 NOTE — Procedures (Signed)
Interventional Radiology Procedure Note  Procedure: Placement of a 23F drain into the fluid collection.  Small volume serosanguinous ascites aspirated and sent for culture.  Drain left to JP bulb.    Complications: None  Estimated Blood Loss: None  Recommendations: - Drain to JP bulb - Follow cx  Signed,  Sterling Big, MD

## 2016-12-24 NOTE — Progress Notes (Signed)
SLP Cancellation Note  Patient Details Name: Jacob Pittman MRN: [id number] DOB: 1946-02-04   Cancelled treatment:       Reason Eval/Treat Not Completed: Patient at procedure or test/unavailable;Medical issues which prohibited therapy. Pt getting drain placed. MD notes also indicate recommendations that he remain NPO with initiation of TPN. Discussed with RN - will hold PO trials with SLP for now but will check back in to assess medical readiness.   [name] 12/24/2016, 11:04 AM  [name], M.A. CCC-SLP [phone]

## 2016-12-24 NOTE — Progress Notes (Signed)
Pharmacy Consult re: TPN Discussed with Hospitalist, may get ID involved Patient with MRSE bacteremia Suggested holding TPN since line has not been changed since patient has become bacteremic Awaiting repeat blood cultures to document clearance Will hold TPN tonight and will reassess in the morning   Jacob Pittman  12/24/2016 3:03 PM

## 2016-12-24 NOTE — Progress Notes (Signed)
Physical Therapy Treatment Patient Details Name: Jacob Pittman MRN: [id number] DOB: 1946-02-17 Today's Date: 12/24/2016    History of Present Illness Patient is a 71 yo male admitted 11/26/16 with perforated duodenal bulb ulcer, now s/p exploratory lap and repair on 11/27/16. Postoperatively remained in shock requiring epinephrine and Levophed drip. Also complicated by ischemic RUE and is s/p embolectomy.  Patient was slow to wean from vent, extubated on 12/10/16.  Patient with confusion.     PMH:   ICM, DM, HTN, PAD, OSA, CHF, EF 15%, ICD, PAF, OA    PT Comments    Patient s/p drain placement today. Tolerated sitting EOB ~10 mins with Max A assist. Able to initiate trunk activation with cues but not able to sustain. Fatigues quickly. Difficulty with sequencing and requires step by step cues for all movement. VSS throughout. Will continue to follow. Recommend lift to chair as tolerated.  Follow Up Recommendations  SNF;Supervision/Assistance - 24 hour     Equipment Recommendations  Other (comment) (TBA)    Recommendations for Other Services       Precautions / Restrictions Precautions Precautions: Fall Precaution Comments: NG tube; feeding tube; Jp drain Restrictions Weight Bearing Restrictions: No    Mobility  Bed Mobility Overal bed mobility: Needs Assistance Bed Mobility: Rolling;Supine to Sit;Sit to Supine Rolling: Max assist;+2 for physical assistance   Supine to sit: Max assist;+2 for physical assistance Sit to supine: Max assist;+2 for physical assistance   General bed mobility comments: Cues for sequencing, able to initiate bringing LEs to EOB with step by step cues, assist with trunk. Assist with bringing BLEs into bed.   Transfers                    Ambulation/Gait                 Stairs            Wheelchair Mobility    Modified Rankin (Stroke Patients Only)       Balance Overall balance assessment: Needs assistance Sitting-balance  support: Bilateral upper extremity supported;Feet supported Sitting balance-Leahy Scale: Poor Sitting balance - Comments: Required Max A for static sitting balance with posterior lean; Able to initiate forward lean but not able to sustain. Worked on facilitation of trunk to right/left but fatigued.  Postural control: Posterior lean                                  Cognition Arousal/Alertness: Awake/alert Behavior During Therapy: WFL for tasks assessed/performed Overall Cognitive Status: No family/caregiver present to determine baseline cognitive functioning Area of Impairment: Following commands;Problem solving                       Following Commands: Follows one step commands with increased time;Follows multi-step commands inconsistently     Problem Solving: Slow processing;Decreased initiation;Requires verbal cues;Requires tactile cues General Comments: Low toned voice. Difficult to understand at times. Calling family member in room granddaughter when she was not. Slow to process.      Exercises      General Comments General comments (skin integrity, edema, etc.): Family present.       Pertinent Vitals/Pain Pain Assessment: Faces Faces Pain Scale: No hurt    Home Living                      Prior Function  PT Goals (current goals can now be found in the care plan section) Progress towards PT goals: Progressing toward goals    Frequency    Min 2X/week      PT Plan Current plan remains appropriate;Frequency needs to be updated    Co-evaluation              AM-PAC PT "6 Clicks" Daily Activity  Outcome Measure  Difficulty turning over in bed (including adjusting bedclothes, sheets and blankets)?: Unable Difficulty moving from lying on back to sitting on the side of the bed? : Unable Difficulty sitting down on and standing up from a chair with arms (e.g., wheelchair, bedside commode, etc,.)?: Unable Help needed  moving to and from a bed to chair (including a wheelchair)?: Total Help needed walking in hospital room?: Total Help needed climbing 3-5 steps with a railing? : Total 6 Click Score: 6    End of Session   Activity Tolerance: Patient limited by fatigue Patient left: in bed;with call bell/phone within reach;with nursing/sitter in room;with bed alarm set;Other (comment);with family/visitor present (prevalon boots) Nurse Communication: Mobility status;Need for lift equipment PT Visit Diagnosis: Muscle weakness (generalized) (M62.81);Difficulty in walking, not elsewhere classified (R26.2)     Time: 1610-9604 PT Time Calculation (min) (ACUTE ONLY): 31 min  Charges:  $Therapeutic Activity: 23-37 mins                    G Codes:       Mylo Red, PT, DPT [phone]     Blake Divine A Violet Seabury 12/24/2016, 4:37 PM

## 2016-12-24 NOTE — Progress Notes (Signed)
Jacob Pittman had an IV infiltrate while in CT 12/22/16, in his LEFT upper arm. I was able to examine his arm today. Jacob Pittman denies any pain to his arm, no hardness, or swelling to the area.

## 2016-12-24 NOTE — Progress Notes (Signed)
24 Days Post-Op    CC:  Perforated ulcer  Subjective: Palliative care met with family and patient- they decided to go forward with IR drain. He denies being in pain  Objective: Vital signs in last 24 hours: Temp:  [97.6 F (36.4 C)-98.3 F (36.8 C)] 98 F (36.7 C) (09/06 0729) Pulse Rate:  [71-95] 75 (09/06 0900) Resp:  [27-38] 34 (09/06 0900) BP: (94-140)/(27-40) 102/34 (09/06 0900) SpO2:  [88 %-100 %] 99 % (09/06 0900) FiO2 (%):  [2 %] 2 % (09/05 1200) Weight:  [87.7 kg (193 lb 5.5 oz)] 87.7 kg (193 lb 5.5 oz) (09/06 0453) Last BM Date: 12/21/16  He is afebrile, VSS No labs this AM  Intake/Output from previous day: 09/05 0701 - 09/06 0700 In: 570 [I.V.:120; IV Piggyback:450] Out: 625 [Urine:625] Intake/Output this shift: No intake/output data recorded.  Alert, answering some questions, some interaction Unlabored resp Abdomen soft, midline wound unchanged with wet to dry dressing  Lab Results:   Recent Labs  12/23/16 0409 12/24/16 0448  WBC 7.4 7.9  HGB 8.6* 8.5*  HCT 28.1* 27.2*  PLT 291 304    BMET  Recent Labs  12/22/16 0514 12/23/16 0409  NA 147* 146*  K 4.1 4.1  CL 115* 113*  CO2 24 23  GLUCOSE 107* 135*  BUN 36* 36*  CREATININE 1.02 1.31*  CALCIUM 7.5* 7.4*   PT/INR  Recent Labs  12/23/16 0409  LABPROT 15.9*  INR 1.28     Recent Labs Lab 12/20/16 0540 12/21/16 0421 12/22/16 0514 12/23/16 0409  AST 46* 201* 149* 161*  ALT 48 96* 125* 142*  ALKPHOS 64 155* 156* 143*  BILITOT 0.6 0.8 0.7 0.9  PROT 6.0* 5.8* 6.0* 6.2*  ALBUMIN 1.7* 1.6* 1.6* 1.7*     Lipase     Component Value Date/Time   LIPASE 20 12/01/2016 0304     Medications: . aspirin  81 mg Per Tube Daily  . chlorhexidine   Topical Once  . chlorhexidine gluconate (MEDLINE KIT)  15 mL Mouth Rinse BID  . Chlorhexidine Gluconate Cloth  6 each Topical Daily  . feeding supplement (PRO-STAT SUGAR FREE 64)  30 mL Per Tube BID  . free water  200 mL Per Tube Q3H  .  insulin aspart  0-20 Units Subcutaneous Q4H  . lidocaine (PF)  5 mL Intradermal Once  . mouth rinse  15 mL Mouth Rinse QID  . metoprolol tartrate  50 mg Per Tube BID  . pantoprazole sodium  40 mg Per Tube BID  . rivaroxaban  20 mg Per Tube Q supper  . sodium chloride flush  10-40 mL Intracatheter Q12H  . sodium hypochlorite   Irrigation BID   . sodium chloride Stopped (12/14/16 0953)  . sodium chloride 10 mL/hr at 12/23/16 2110  . feeding supplement (VITAL 1.5 CAL) 1,000 mL (12/20/16 0825)  . piperacillin-tazobactam (ZOSYN)  IV 3.375 g (12/24/16 0519)  . vancomycin Stopped (12/24/16 0160)   Anti-infectives    Start     Dose/Rate Route Frequency Ordered Stop   12/23/16 1800  vancomycin (VANCOCIN) IVPB 750 mg/150 ml premix     750 mg 150 mL/hr over 60 Minutes Intravenous Every 12 hours 12/23/16 0310     12/23/16 0145  vancomycin (VANCOCIN) 1,750 mg in sodium chloride 0.9 % 500 mL IVPB     1,750 mg 250 mL/hr over 120 Minutes Intravenous  Once 12/23/16 0138 12/23/16 0353   12/21/16 1500  piperacillin-tazobactam (ZOSYN) IVPB 3.375 g  3.375 g 12.5 mL/hr over 240 Minutes Intravenous Every 8 hours 12/21/16 1456     12/13/16 1000  amoxicillin (AMOXIL) 250 MG/5ML suspension 1,000 mg     1,000 mg Per Tube Every 12 hours 12/13/16 0935 12/16/16 1833   12/12/16 1900  piperacillin-tazobactam (ZOSYN) IVPB 3.375 g  Status:  Discontinued     3.375 g 12.5 mL/hr over 240 Minutes Intravenous Every 8 hours 12/12/16 1759 12/13/16 0911   12/12/16 1400  Ampicillin-Sulbactam (UNASYN) 3 g in sodium chloride 0.9 % 100 mL IVPB  Status:  Discontinued     3 g 200 mL/hr over 30 Minutes Intravenous Every 6 hours 12/12/16 1215 12/12/16 1753   12/10/16 0600  amoxicillin (AMOXIL) 250 MG/5ML suspension 1,000 mg  Status:  Discontinued     1,000 mg Per Tube Every 12 hours 12/09/16 1701 12/12/16 1206   12/09/16 1130  amoxicillin (AMOXIL) 250 MG/5ML suspension 1,000 mg  Status:  Discontinued     1,000 mg Oral Every  12 hours 12/09/16 1117 12/09/16 1118   12/09/16 1130  amoxicillin (AMOXIL) 250 MG/5ML suspension 1,000 mg  Status:  Discontinued     1,000 mg Per Tube Every 12 hours 12/09/16 1118 12/09/16 1701   12/04/16 2200  clarithromycin (BIAXIN) tablet 500 mg     500 mg Per Tube Every 12 hours 12/04/16 1107 12/16/16 2158   12/04/16 2000  piperacillin-tazobactam (ZOSYN) IVPB 3.375 g     3.375 g 12.5 mL/hr over 240 Minutes Intravenous Every 8 hours 12/04/16 1339 12/08/16 2359   12/04/16 1600  metroNIDAZOLE (FLAGYL) tablet 500 mg  Status:  Discontinued     500 mg Per Tube Every 8 hours 12/04/16 1107 12/09/16 1117   12/02/16 1100  clarithromycin (BIAXIN) 250 MG/5ML suspension 500 mg  Status:  Discontinued     500 mg Per Tube Every 12 hours 12/02/16 1012 12/04/16 1107   12/02/16 1045  metroNIDAZOLE (FLAGYL) 50 mg/ml oral suspension 500 mg  Status:  Discontinued     500 mg Per Tube 3 times daily 12/02/16 1036 12/04/16 1107   12/02/16 1015  metroNIDAZOLE (FLAGYL) 50 mg/ml oral suspension 250 mg  Status:  Discontinued     250 mg Per Tube 4 times daily 12/02/16 1011 12/02/16 1036   11/28/16 0800  fluconazole (DIFLUCAN) IVPB 200 mg  Status:  Discontinued     200 mg 100 mL/hr over 60 Minutes Intravenous Every 24 hours 11/27/16 0624 12/02/16 1018   11/27/16 0630  fluconazole (DIFLUCAN) IVPB 400 mg     400 mg 100 mL/hr over 120 Minutes Intravenous  Once 11/27/16 0622 11/27/16 0841   11/27/16 0600  piperacillin-tazobactam (ZOSYN) IVPB 3.375 g  Status:  Discontinued     3.375 g 12.5 mL/hr over 240 Minutes Intravenous Every 8 hours 11/27/16 0534 12/04/16 1339   11/27/16 0515  piperacillin-tazobactam (ZOSYN) IVPB 3.375 g  Status:  Discontinued     3.375 g 100 mL/hr over 30 Minutes Intravenous  Once 11/27/16 0510 11/27/16 0518   11/27/16 0030  piperacillin-tazobactam (ZOSYN) IVPB 3.375 g     3.375 g 100 mL/hr over 30 Minutes Intravenous  Once 11/27/16 0016 11/27/16 0755   11/27/16 0030  vancomycin (VANCOCIN) IVPB  1000 mg/200 mL premix     1,000 mg 200 mL/hr over 60 Minutes Intravenous  Once 11/27/16 0016 11/27/16 0756      Assessment/Plan  Perforated pyloric ulcer  S/P EXPLORATORY LAPAROTOMY, [facility] PATCH REPAIR8/10/18 Dr. 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 Oneida Alar ? CVA Hypernatremia/hyperchloremia - Free water 200 ml q8h Dysphagia- dobhoff tube in place Type II diabetes Acute renal failure - resolving FEN: IV fluids/npo/tube feedings ID:  zosyn 8/9- 12/08/16, Diflucan 8/9-8/15/18, clarithromycin 8/17 - 8/29 Amoxicillin 8/23-29/18 =>>   Vancomycin restarted 9/5 =>> Zosyn restarted 9/3 =>>  DVT: SCD/heparin =>>Xarelto per TF last dose last PM    Plan:  For IR drain today. We will continue to follow. Appreciate palliative care seeing him  LOS: 27 days    Jacob Pittman 12/24/2016

## 2016-12-24 NOTE — Progress Notes (Signed)
PROGRESS NOTE    Jacob Pittman  IWL:[id number] DOB: July 06, 1945 DOA: 11/26/2016 PCP: Biagio Borg, MD   Brief Narrative: Jacob Pittman is a 71 y.o. 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 Was found to have a perforated pyloric channel ulcer and pneumoperitonitis s/p omental patch repair subsequently on epinephrine and Levophed.  8/12-  TTE LV moderately dilated with EF 15% &diffuse hypokinesis. There is akinesis of the inferolateral and inferior myocardium.  E coli UTI noted 8/13-  right brachial A- line placed and then developed ischemic right arm- vascular consult and transfer from [facility] to [facility]- thrombectomy of brachial artery- heparin Paroxysmal A-fib also noted 8/14- Decreased attenuation involving the right dentate nucleus in the right cerebellum and immediately adjacent cerebellar parenchyma, concerning for recent and potentially acute infarct in this area.  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- anticoagulation recommended to be stopped for 10-14 days - H pyloli + started on triple therapy - Transaminitis thought to be du to Diflucan hepatotoxicity 8/17 - febrile again and vasopressors resumed 8/23- extubated 8/25 A-fib with HR up to 160s, fever 101.6 at 4 AM- care transferred to [facility] 9/3 - stool noted to be coming out of wound   Assessment & Plan:   Principal Problem:   Perforated duodenal bulb ulcer ([place]) Active Problems:   Acute on chronic combined systolic and diastolic CHF (congestive heart failure) ([place])   Nonischemic cardiomyopathy (HCC)   Pneumoperitoneum   Acute respiratory failure with hypoxia (HCC)   Paroxysmal A-fib ([place])   CVA (cerebral vascular accident) ([place])   Arterial thrombosis ([place])   H pylori ulcer   Fatty infiltration of liver   DM (diabetes mellitus), type 2 (HCC)   Pressure sore on heel, left, unstageable ([place])   Poor venous  access   Palliative care encounter   Goals of care, counseling/discussion   Perforated duodenal bulb ulcer Peritonitis H. Pylori ulcer Septic shock Abdominal Abscess Patient has had a prolonged hospital course secondary to above problems. Patient has received multiple antibiotic therapies. He now has evidence of a abscess tracking along sigmoid colon mesentery/omentum with drainage through the wound, evidence of wound necrosis. -General surgery recommendations -Palliative care recommendations: [place] discussion yesterday. Continue medical treatment for now -Interventional radiology recommendations: drain to be placed today -consult pharmacy for TPN  Methicillin resistant staph species bacteremia Seen on 9/3 blood cultures x2. -continue Vancomycin   Tachypnea Pulmonary edema on imaging. EF of 15% -lasix  Acute on chronic systolic heart failure EF of 15%. Given IV fluids. Weight is up about 8 lbs over the last 5 days. -lasix '20mg'$  IV secondary to low blood pressure -strict in/out  Paroxysmal atrial fibrillation -continue metoprolol  Bilateral basilar atelectasis -continue to encourage incentive spirometry  Hypernatremia Improved slightly  Acute respiratory failure with hypoxia Previously intubated and successfully extubated. Secondary to sepsis.  CVA Likely embolic from atrial fibrillation -continue Xarelto -continue aspirin  Arterial thrombosis Right brachial artery. Secondary to A-line. S/p thrombectomy  Bilateral arm edema No DVT on ultrasound -Lasix as above  Fatty liver  Diabetes mellitus, type 2 A1c of 5.8% -continue SSI   DVT prophylaxis: Xarelto Code Status: DNR Family Communication: None at bedside Disposition Plan: Discharge pending goals of care   Consultants:   General surgery  Interventional radiology  Palliative care medicine  Procedures:   8/10- ex lap- patch repair of pyloric ucler  8/13- brachial embolectomy  2 D ECHO Left  ventricle: The cavity size was moderately dilated. Wallthickness was normal. Systolic function was severely reduced. Theestimated ejection fraction was 15%. Diffuse hypokinesis. Thereis akinesis of the inferolateral and inferior myocardium. Thestudy is not technically sufficient to allow evaluation of LVdiastolic function. - Aortic valve: There was mild regurgitation. - Mitral valve: There was moderate regurgitation. - Left atrium: The atrium was severely dilated. - Right ventricle: The cavity size was moderately dilated. Systolicfunction was moderately reduced. - Right atrium: The atrium was severely dilated. - Tricuspid valve: There was moderate regurgitation. - Pulmonary arteries: Systolic pressure was moderately increased.  Antimicrobials:  Amoxicillin  Unasyn  Clarithromycin  Metronidazole  Zosyn  Vancomycin    Subjective: Improved dyspnea per patient. No abdominal pain.  Objective: Vitals:   12/24/16 0453 12/24/16 0729 12/24/16 0800 12/24/16 0900  BP:  (!) 108/34 (!) 105/36 (!) 102/34  Pulse:   73 75  Resp:   (!) 31 (!) 34  Temp:  98 F (36.7 C)    TempSrc:  Axillary    SpO2:   100% 99%  Weight: 87.7 kg (193 lb 5.5 oz)     Height:        Intake/Output Summary (Last 24 hours) at 12/24/16 1033 Last data filed at 12/24/16 0600  Gross per 24 hour  Intake              570 ml  Output              625 ml  Net              -55 ml   Filed Weights   12/22/16 0500 12/23/16 0355 12/24/16 0453  Weight: 88.7 kg (195 lb 8.8 oz) 88.1 kg (194 lb 3.6 oz) 87.7 kg (193 lb 5.5 oz)    Examination:  General exam: Appears calm and comfortable Respiratory system: Clear to auscultation with decreased breath sounds bilaterally. Respiratory effort normal. Tachypnea. Cardiovascular system: S1 & S2 heard, RRR. 2/6 systolic murmur. Gastrointestinal system: Abdomen is nondistended, soft and nontender. Abdominal binder in place. Normal bowel sounds heard. Central nervous  system: Alert.  Extremities: Bilateral upper extremity edema. No calf tenderness Skin: No cyanosis. No rashes Psychiatry: Judgement and insight appear normal. Mood & affect depressed and flat.     Data Reviewed: I have personally reviewed following labs and imaging studies  CBC:  Recent Labs Lab 12/21/16 0803 12/21/16 2103 12/22/16 0514 12/23/16 0409 12/24/16 0448  WBC 8.5 7.3 7.1 7.4 7.9  NEUTROABS  --  5.4  --   --   --   HGB 9.1* 8.4* 8.4* 8.6* 8.5*  HCT 29.5* 27.3* 27.4* 28.1* 27.2*  MCV 106.5* 105.8* 105.8* 106.4* 106.7*  PLT 215 247 246 291 170   Basic Metabolic Panel:  Recent Labs Lab 12/18/16 0207  12/19/16 0174 12/20/16 0540 12/21/16 0421 12/21/16 2103 12/22/16 0514 12/23/16 0409  NA 152*  --  147* 149* 147* 144 147* 146*  K 3.4*  < > 3.8 3.9 3.7 4.1 4.1 4.1  CL 122*  --  118* 118* 117* 113* 115* 113*  CO2 24  --  21* '22 22 24 24 23  '$ GLUCOSE 168*  --  175* 175* 195* 100* 107* 135*  BUN 34*  --  30* 32* 36* 33* 36* 36*  CREATININE 0.82  --  0.88 0.91 0.94 0.94 1.02 1.31*  CALCIUM 7.7*  --  7.6* 7.5* 7.5* 7.4* 7.5* 7.4*  MG 2.1  --  2.5* 2.2 2.2  --   --   --  PHOS  --   --   --  2.3*  --   --   --  4.4  < > = values in this interval not displayed. GFR: Estimated Creatinine Clearance: 56.8 mL/min (A) (by C-G formula based on SCr of 1.31 mg/dL (H)). Liver Function Tests:  Recent Labs Lab 12/20/16 0540 12/21/16 0421 12/22/16 0514 12/23/16 0409  AST 46* 201* 149* 161*  ALT 48 96* 125* 142*  ALKPHOS 64 155* 156* 143*  BILITOT 0.6 0.8 0.7 0.9  PROT 6.0* 5.8* 6.0* 6.2*  ALBUMIN 1.7* 1.6* 1.6* 1.7*   No results for input(s): LIPASE, AMYLASE in the last 168 hours. No results for input(s): AMMONIA in the last 168 hours. Coagulation Profile:  Recent Labs Lab 12/23/16 0409  INR 1.28   Cardiac Enzymes: No results for input(s): CKTOTAL, CKMB, CKMBINDEX, TROPONINI in the last 168 hours. BNP (last 3 results) No results for input(s): PROBNP in the  last 8760 hours. HbA1C: No results for input(s): HGBA1C in the last 72 hours. CBG:  Recent Labs Lab 12/23/16 1622 12/23/16 1912 12/23/16 2309 12/24/16 0333 12/24/16 0727  GLUCAP 171* 164* 78 101* 92   Lipid Profile: No results for input(s): CHOL, HDL, LDLCALC, TRIG, CHOLHDL, LDLDIRECT in the last 72 hours. Thyroid Function Tests: No results for input(s): TSH, T4TOTAL, FREET4, T3FREE, THYROIDAB in the last 72 hours. Anemia Panel: No results for input(s): VITAMINB12, FOLATE, FERRITIN, TIBC, IRON, RETICCTPCT in the last 72 hours. Sepsis Labs:  Recent Labs Lab 12/21/16 2103  LATICACIDVEN 1.1    Recent Results (from the past 240 hour(s))  Culture, blood (routine x 2)     Status: None   Collection Time: 12/16/16  4:54 AM  Result Value Ref Range Status   Specimen Description BLOOD LEFT HAND  Final   Special Requests IN PEDIATRIC BOTTLE Blood Culture adequate volume  Final   Culture NO GROWTH 5 DAYS  Final   Report Status 12/21/2016 FINAL  Final  Culture, blood (routine x 2)     Status: None   Collection Time: 12/16/16  4:59 AM  Result Value Ref Range Status   Specimen Description BLOOD LEFT HAND  Final   Special Requests IN PEDIATRIC BOTTLE Blood Culture adequate volume  Final   Culture NO GROWTH 5 DAYS  Final   Report Status 12/21/2016 FINAL  Final  Culture, blood (Routine X 2) w Reflex to ID Panel     Status: None (Preliminary result)   Collection Time: 12/21/16  6:32 PM  Result Value Ref Range Status   Specimen Description BLOOD LEFT HAND  Final   Special Requests   Final    BOTTLES DRAWN AEROBIC ONLY Blood Culture adequate volume   Culture  Setup Time   Final    GRAM POSITIVE COCCI IN CLUSTERS AEROBIC BOTTLE ONLY Organism ID to follow CRITICAL RESULT CALLED TO, READ BACK BY AND VERIFIED WITH: K COOK PHARMD 2304 12/22/16 A BROWNING    Culture GRAM POSITIVE COCCI IDENTIFICATION TO FOLLOW   Final   Report Status PENDING  Incomplete  Blood Culture ID Panel (Reflexed)      Status: Abnormal   Collection Time: 12/21/16  6:32 PM  Result Value Ref Range Status   Enterococcus species NOT DETECTED NOT DETECTED Final   Listeria monocytogenes NOT DETECTED NOT DETECTED Final   Staphylococcus species DETECTED (A) NOT DETECTED Final    Comment: Methicillin (oxacillin) resistant coagulase negative staphylococcus. Possible blood culture contaminant (unless isolated from more than one blood culture draw  or clinical case suggests pathogenicity). No antibiotic treatment is indicated for blood  culture contaminants. CRITICAL RESULT CALLED TO, READ BACK BY AND VERIFIED WITH: K COOK PHARMD [id number] 12/22/16 A BROWNING    Staphylococcus aureus NOT DETECTED NOT DETECTED Final   Methicillin resistance DETECTED (A) NOT DETECTED Final    Comment: CRITICAL RESULT CALLED TO, READ BACK BY AND VERIFIED WITH: K COOK PHARMD 2304 12/22/16 A BROWNING    Streptococcus species NOT DETECTED NOT DETECTED Final   Streptococcus agalactiae NOT DETECTED NOT DETECTED Final   Streptococcus pneumoniae NOT DETECTED NOT DETECTED Final   Streptococcus pyogenes NOT DETECTED NOT DETECTED Final   Acinetobacter baumannii NOT DETECTED NOT DETECTED Final   Enterobacteriaceae species NOT DETECTED NOT DETECTED Final   Enterobacter cloacae complex NOT DETECTED NOT DETECTED Final   Escherichia coli NOT DETECTED NOT DETECTED Final   Klebsiella oxytoca NOT DETECTED NOT DETECTED Final   Klebsiella pneumoniae NOT DETECTED NOT DETECTED Final   Proteus species NOT DETECTED NOT DETECTED Final   Serratia marcescens NOT DETECTED NOT DETECTED Final   Haemophilus influenzae NOT DETECTED NOT DETECTED Final   Neisseria meningitidis NOT DETECTED NOT DETECTED Final   Pseudomonas aeruginosa NOT DETECTED NOT DETECTED Final   Candida albicans NOT DETECTED NOT DETECTED Final   Candida glabrata NOT DETECTED NOT DETECTED Final   Candida krusei NOT DETECTED NOT DETECTED Final   Candida parapsilosis NOT DETECTED NOT DETECTED Final     Candida tropicalis NOT DETECTED NOT DETECTED Final  Culture, blood (Routine X 2) w Reflex to ID Panel     Status: None (Preliminary result)   Collection Time: 12/21/16  6:38 PM  Result Value Ref Range Status   Specimen Description BLOOD RIGHT ARM  Final   Special Requests IN PEDIATRIC BOTTLE Blood Culture adequate volume  Final   Culture  Setup Time   Final    GRAM POSITIVE COCCI IN CLUSTERS IN PEDIATRIC BOTTLE CRITICAL VALUE NOTED.  VALUE IS CONSISTENT WITH PREVIOUSLY REPORTED AND CALLED VALUE.    Culture GRAM POSITIVE COCCI  Final   Report Status PENDING  Incomplete  Surgical pcr screen     Status: None   Collection Time: 12/21/16  9:33 PM  Result Value Ref Range Status   MRSA, PCR NEGATIVE NEGATIVE Final   Staphylococcus aureus NEGATIVE NEGATIVE Final    Comment: (NOTE) The Xpert SA Assay (FDA approved for NASAL specimens in patients 50 years of age and older), is one component of a comprehensive surveillance program. It is not intended to diagnose infection nor to guide or monitor treatment.          Radiology Studies: Dg Chest Port 1 View  Result Date: 12/23/2016 CLINICAL DATA:  Shortness of breath; chronic CHF, cardiomyopathy, peripheral vascular disease EXAM: PORTABLE CHEST 1 VIEW COMPARISON:  Portable chest x-ray of December 22, 2016 FINDINGS: The lungs are reasonably well inflated. Bibasilar densities are less conspicuous today. There are small bilateral pleural effusions. The cardiac silhouette remains enlarged. The pulmonary vascularity is less engorged. The ICD is in stable position. The feeding tube tip projects below the inferior margin of the image. A left internal jugular venous catheter tip projects in the midportion of the SVC. There is calcification in the wall of the aortic arch. IMPRESSION: Decreased pulmonary interstitial edema. Decreased bibasilar atelectasis or pneumonia. Persistent mild CHF with small effusions. Electronically Signed   By: David  Martinique  M.D.   On: 12/23/2016 08:27        Scheduled Meds: .  aspirin  81 mg Per Tube Daily  . chlorhexidine   Topical Once  . chlorhexidine gluconate (MEDLINE KIT)  15 mL Mouth Rinse BID  . Chlorhexidine Gluconate Cloth  6 each Topical Daily  . feeding supplement (PRO-STAT SUGAR FREE 64)  30 mL Per Tube BID  . free water  200 mL Per Tube Q3H  . insulin aspart  0-20 Units Subcutaneous Q4H  . lidocaine (PF)  5 mL Intradermal Once  . mouth rinse  15 mL Mouth Rinse QID  . metoprolol tartrate  50 mg Per Tube BID  . pantoprazole sodium  40 mg Per Tube BID  . rivaroxaban  20 mg Per Tube Q supper  . sodium chloride flush  10-40 mL Intracatheter Q12H  . sodium hypochlorite   Irrigation BID   Continuous Infusions: . sodium chloride Stopped (12/14/16 0953)  . sodium chloride 10 mL/hr at 12/23/16 2110  . feeding supplement (VITAL 1.5 CAL) 1,000 mL (12/20/16 0825)  . piperacillin-tazobactam (ZOSYN)  IV Stopped (12/24/16 0919)  . vancomycin Stopped (12/24/16 9735)     LOS: 27 days     Cordelia Poche, MD [facility] 12/24/2016, 10:33 AM Pager: [phone]  If 7PM-7AM, please contact night-coverage [email or web address] Password [email or web address] 12/24/2016, 10:33 AM

## 2016-12-25 ENCOUNTER — Telehealth: Payer: Self-pay | Admitting: Cardiology

## 2016-12-25 ENCOUNTER — Inpatient Hospital Stay (HOSPITAL_COMMUNITY): Payer: Medicare PPO

## 2016-12-25 ENCOUNTER — Other Ambulatory Visit (HOSPITAL_COMMUNITY): Payer: Medicare PPO

## 2016-12-25 DIAGNOSIS — I361 Nonrheumatic tricuspid (valve) insufficiency: Secondary | ICD-10-CM

## 2016-12-25 DIAGNOSIS — T814XXD Infection following a procedure, subsequent encounter: Secondary | ICD-10-CM

## 2016-12-25 DIAGNOSIS — I340 Nonrheumatic mitral (valve) insufficiency: Secondary | ICD-10-CM

## 2016-12-25 LAB — GLUCOSE, CAPILLARY
GLUCOSE-CAPILLARY: 102 mg/dL — AB (ref 65–99)
GLUCOSE-CAPILLARY: 105 mg/dL — AB (ref 65–99)
GLUCOSE-CAPILLARY: 88 mg/dL (ref 65–99)
GLUCOSE-CAPILLARY: 90 mg/dL (ref 65–99)
GLUCOSE-CAPILLARY: 98 mg/dL (ref 65–99)
Glucose-Capillary: 99 mg/dL (ref 65–99)
Glucose-Capillary: 93 mg/dL (ref 65–99)

## 2016-12-25 LAB — DIFFERENTIAL
BASOS PCT: 0 %
Basophils Absolute: 0 10*3/uL (ref 0.0–0.1)
EOS ABS: 0.1 10*3/uL (ref 0.0–0.7)
EOS PCT: 1 %
LYMPHS PCT: 11 %
Lymphs Abs: 1.1 10*3/uL (ref 0.7–4.0)
MONO ABS: 0.3 10*3/uL (ref 0.1–1.0)
Monocytes Relative: 4 %
Neutro Abs: 8.2 10*3/uL — ABNORMAL HIGH (ref 1.7–7.7)
Neutrophils Relative %: 84 %

## 2016-12-25 LAB — CULTURE, BLOOD (ROUTINE X 2)
Special Requests: ADEQUATE
Special Requests: ADEQUATE

## 2016-12-25 LAB — MAGNESIUM: MAGNESIUM: 2.3 mg/dL (ref 1.7–2.4)

## 2016-12-25 LAB — CBC
HEMATOCRIT: 23.5 % — AB (ref 39.0–52.0)
Hemoglobin: 7.3 g/dL — ABNORMAL LOW (ref 13.0–17.0)
MCH: 32.9 pg (ref 26.0–34.0)
MCHC: 31.1 g/dL (ref 30.0–36.0)
MCV: 105.9 fL — AB (ref 78.0–100.0)
PLATELETS: 356 10*3/uL (ref 150–400)
RBC: 2.22 MIL/uL — ABNORMAL LOW (ref 4.22–5.81)
RDW: 15.4 % (ref 11.5–15.5)
WBC: 9.8 10*3/uL (ref 4.0–10.5)

## 2016-12-25 LAB — COMPREHENSIVE METABOLIC PANEL
ALT: 99 U/L — ABNORMAL HIGH (ref 17–63)
ANION GAP: 11 (ref 5–15)
AST: 70 U/L — ABNORMAL HIGH (ref 15–41)
Albumin: 1.8 g/dL — ABNORMAL LOW (ref 3.5–5.0)
Alkaline Phosphatase: 130 U/L — ABNORMAL HIGH (ref 38–126)
BILIRUBIN TOTAL: 0.9 mg/dL (ref 0.3–1.2)
BUN: 46 mg/dL — ABNORMAL HIGH (ref 6–20)
CO2: 22 mmol/L (ref 22–32)
Calcium: 7.4 mg/dL — ABNORMAL LOW (ref 8.9–10.3)
Chloride: 112 mmol/L — ABNORMAL HIGH (ref 101–111)
Creatinine, Ser: 1.35 mg/dL — ABNORMAL HIGH (ref 0.61–1.24)
GFR, EST AFRICAN AMERICAN: 59 mL/min — AB (ref >60)
GFR, EST NON AFRICAN AMERICAN: 50 mL/min — AB (ref >60)
Glucose, Bld: 90 mg/dL (ref 65–99)
POTASSIUM: 3.5 mmol/L (ref 3.5–5.1)
Sodium: 145 mmol/L (ref 135–145)
TOTAL PROTEIN: 6.2 g/dL — AB (ref 6.5–8.1)

## 2016-12-25 LAB — PREALBUMIN: PREALBUMIN: 8.2 mg/dL — AB (ref 18–38)

## 2016-12-25 LAB — TRIGLYCERIDES: TRIGLYCERIDES: 189 mg/dL — AB (ref <150)

## 2016-12-25 LAB — PHOSPHORUS: PHOSPHORUS: 3.7 mg/dL (ref 2.5–4.6)

## 2016-12-25 LAB — CK: Total CK: 48 U/L — ABNORMAL LOW (ref 49–397)

## 2016-12-25 MED ORDER — VITAL 1.5 CAL PO LIQD
1000.0000 mL | ORAL | Status: DC
  Administered 2016-12-25 – 2016-12-27 (×3): 1000 mL
  Filled 2016-12-25 (×6): qty 1000

## 2016-12-25 MED ORDER — ALBUMIN HUMAN 25 % IV SOLN
50.0000 g | Freq: Once | INTRAVENOUS | Status: DC
  Filled 2016-12-25: qty 50

## 2016-12-25 MED ORDER — SODIUM CHLORIDE 0.9 % IV SOLN
500.0000 mg | INTRAVENOUS | Status: DC
  Administered 2016-12-25 – 2016-12-31 (×7): 500 mg via INTRAVENOUS
  Filled 2016-12-25 (×8): qty 10

## 2016-12-25 MED ORDER — ALBUMIN HUMAN 25 % IV SOLN
50.0000 g | Freq: Once | INTRAVENOUS | Status: AC
  Administered 2016-12-26: 50 g via INTRAVENOUS
  Filled 2016-12-25: qty 200

## 2016-12-25 MED ORDER — SODIUM CHLORIDE 0.9 % IV SOLN
500.0000 mg | INTRAVENOUS | Status: DC
  Filled 2016-12-25: qty 10

## 2016-12-25 NOTE — Progress Notes (Signed)
25 Days Post-Op    CC:  Perforated ulcer  Subjective: He looks better today especially compared to Tuesday this week 12/22/16. Wound is cleaning up some, he is more alert, and seems to feel better.  Drainage from the JP is serosanguinous fluid.    Objective: Vital signs in last 24 hours: Temp:  [97 F (36.1 C)-98.3 F (36.8 C)] 97 F (36.1 C) (09/07 1237) Pulse Rate:  [57-83] 71 (09/07 1237) Resp:  [17-39] 33 (09/07 1237) BP: (67-113)/(25-77) 99/31 (09/07 1237) SpO2:  [97 %-100 %] 100 % (09/07 0800) Weight:  [86 kg (189 lb 9.5 oz)] 86 kg (189 lb 9.5 oz) (09/07 0500) Last BM Date: 12/21/16  Intake/Output from previous day: 09/06 0701 - 09/07 0700 In: 1076.7 [I.V.:661.7; IV Piggyback:400] Out: 635 [Urine:625; Drains:10] Intake/Output this shift: Total I/O In: -  Out: 450 [Urine:450]  General appearance: alert, cooperative, no distress and talking and responding more than he as Tuesday.   Resp: Clear anterior exam GI: soft, drain is clear serosanguinous fluid and he is not having pain.  Open abdominal would is cleaning up again, still some cloudy fluid at the base of the open wound.  Less necrotic tissue now than earlier in the week.  Lab Results:   Recent Labs  12/24/16 0448 12/25/16 0221  WBC 7.9 9.8  HGB 8.5* 7.3*  HCT 27.2* 23.5*  PLT 304 356    BMET  Recent Labs  12/24/16 0956 12/25/16 0221  NA 146* 145  K 3.8 3.5  CL 113* 112*  CO2 23 22  GLUCOSE 91 90  BUN 50* 46*  CREATININE 1.52* 1.35*  CALCIUM 7.3* 7.4*   PT/INR  Recent Labs  12/23/16 0409  LABPROT 15.9*  INR 1.28     Recent Labs Lab 12/20/16 0540 12/21/16 0421 12/22/16 0514 12/23/16 0409 12/25/16 0221  AST 46* 201* 149* 161* 70*  ALT 48 96* 125* 142* 99*  ALKPHOS 64 155* 156* 143* 130*  BILITOT 0.6 0.8 0.7 0.9 0.9  PROT 6.0* 5.8* 6.0* 6.2* 6.2*  ALBUMIN 1.7* 1.6* 1.6* 1.7* 1.8*     Lipase     Component Value Date/Time   LIPASE 20 12/01/2016 0304     Medications: .  aspirin  81 mg Per Tube Daily  . chlorhexidine   Topical Once  . chlorhexidine gluconate (MEDLINE KIT)  15 mL Mouth Rinse BID  . enoxaparin (LOVENOX) injection  85 mg Subcutaneous Q12H  . insulin aspart  0-20 Units Subcutaneous Q4H  . lidocaine (PF)  5 mL Intradermal Once  . mouth rinse  15 mL Mouth Rinse QID  . pantoprazole (PROTONIX) IV  40 mg Intravenous Q12H  . sodium chloride flush  5 mL Intravenous Q8H  . sodium hypochlorite   Irrigation BID   . sodium chloride Stopped (12/14/16 0953)  . sodium chloride 10 mL/hr at 12/23/16 2110  . DAPTOmycin (CUBICIN)  IV    . dextrose 5 % and 0.9% NaCl 50 mL/hr at 12/24/16 1758  . piperacillin-tazobactam (ZOSYN)  IV Stopped (12/25/16 0956)   Anti-infectives    Start     Dose/Rate Route Frequency Ordered Stop   12/25/16 1600  DAPTOmycin (CUBICIN) 500 mg in sodium chloride 0.9 % IVPB     500 mg 220 mL/hr over 30 Minutes Intravenous Every 24 hours 12/25/16 1002     12/25/16 1300  DAPTOmycin (CUBICIN) 500 mg in sodium chloride 0.9 % IVPB  Status:  Discontinued     500 mg 220 mL/hr over 30  Minutes Intravenous Every 24 hours 12/25/16 0956 12/25/16 1002   12/23/16 1800  vancomycin (VANCOCIN) IVPB 750 mg/150 ml premix  Status:  Discontinued     750 mg 150 mL/hr over 60 Minutes Intravenous Every 12 hours 12/23/16 0310 12/25/16 0941   12/23/16 0145  vancomycin (VANCOCIN) 1,750 mg in sodium chloride 0.9 % 500 mL IVPB     1,750 mg 250 mL/hr over 120 Minutes Intravenous  Once 12/23/16 0138 12/23/16 0353   12/21/16 1500  piperacillin-tazobactam (ZOSYN) IVPB 3.375 g     3.375 g 12.5 mL/hr over 240 Minutes Intravenous Every 8 hours 12/21/16 1456     12/13/16 1000  amoxicillin (AMOXIL) 250 MG/5ML suspension 1,000 mg     1,000 mg Per Tube Every 12 hours 12/13/16 0935 12/16/16 1833   12/12/16 1900  piperacillin-tazobactam (ZOSYN) IVPB 3.375 g  Status:  Discontinued     3.375 g 12.5 mL/hr over 240 Minutes Intravenous Every 8 hours 12/12/16 1759 12/13/16  0911   12/12/16 1400  Ampicillin-Sulbactam (UNASYN) 3 g in sodium chloride 0.9 % 100 mL IVPB  Status:  Discontinued     3 g 200 mL/hr over 30 Minutes Intravenous Every 6 hours 12/12/16 1215 12/12/16 1753   12/10/16 0600  amoxicillin (AMOXIL) 250 MG/5ML suspension 1,000 mg  Status:  Discontinued     1,000 mg Per Tube Every 12 hours 12/09/16 1701 12/12/16 1206   12/09/16 1130  amoxicillin (AMOXIL) 250 MG/5ML suspension 1,000 mg  Status:  Discontinued     1,000 mg Oral Every 12 hours 12/09/16 1117 12/09/16 1118   12/09/16 1130  amoxicillin (AMOXIL) 250 MG/5ML suspension 1,000 mg  Status:  Discontinued     1,000 mg Per Tube Every 12 hours 12/09/16 1118 12/09/16 1701   12/04/16 2200  clarithromycin (BIAXIN) tablet 500 mg     500 mg Per Tube Every 12 hours 12/04/16 1107 12/16/16 2158   12/04/16 2000  piperacillin-tazobactam (ZOSYN) IVPB 3.375 g     3.375 g 12.5 mL/hr over 240 Minutes Intravenous Every 8 hours 12/04/16 1339 12/08/16 2359   12/04/16 1600  metroNIDAZOLE (FLAGYL) tablet 500 mg  Status:  Discontinued     500 mg Per Tube Every 8 hours 12/04/16 1107 12/09/16 1117   12/02/16 1100  clarithromycin (BIAXIN) 250 MG/5ML suspension 500 mg  Status:  Discontinued     500 mg Per Tube Every 12 hours 12/02/16 1012 12/04/16 1107   12/02/16 1045  metroNIDAZOLE (FLAGYL) 50 mg/ml oral suspension 500 mg  Status:  Discontinued     500 mg Per Tube 3 times daily 12/02/16 1036 12/04/16 1107   12/02/16 1015  metroNIDAZOLE (FLAGYL) 50 mg/ml oral suspension 250 mg  Status:  Discontinued     250 mg Per Tube 4 times daily 12/02/16 1011 12/02/16 1036   11/28/16 0800  fluconazole (DIFLUCAN) IVPB 200 mg  Status:  Discontinued     200 mg 100 mL/hr over 60 Minutes Intravenous Every 24 hours 11/27/16 0624 12/02/16 1018   11/27/16 0630  fluconazole (DIFLUCAN) IVPB 400 mg     400 mg 100 mL/hr over 120 Minutes Intravenous  Once 11/27/16 0622 11/27/16 0841   11/27/16 0600  piperacillin-tazobactam (ZOSYN) IVPB 3.375 g   Status:  Discontinued     3.375 g 12.5 mL/hr over 240 Minutes Intravenous Every 8 hours 11/27/16 0534 12/04/16 1339   11/27/16 0515  piperacillin-tazobactam (ZOSYN) IVPB 3.375 g  Status:  Discontinued     3.375 g 100 mL/hr over 30 Minutes Intravenous  Once 11/27/16 0510 11/27/16 0518   11/27/16 0030  piperacillin-tazobactam (ZOSYN) IVPB 3.375 g     3.375 g 100 mL/hr over 30 Minutes Intravenous  Once 11/27/16 0016 11/27/16 0755   11/27/16 0030  vancomycin (VANCOCIN) IVPB 1000 mg/200 mL premix     1,000 mg 200 mL/hr over 60 Minutes Intravenous  Once 11/27/16 0016 11/27/16 0756     Assessment/Plan Perforated pyloric ulcer  S/P EXPLORATORY LAPAROTOMY, [place] PATCH REPAIR8/10/18 Dr. 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 Oneida Alar ? CVA Malnutrition - prealbumin 8.2 Hypernatremia/hyperchloremia - Free water 200 ml q8h Dysphagia Type II diabetes Acute renal failure - resolving FEN: IV fluids/npo/ ID:  zosyn 8/9- 12/08/16, Diflucan 8/9-8/15/18, clarithromycin 8/17 - 8/29 Amoxicillin 8/23-29/18 =>>day 15 of antibiotics              Vancomycin restarted 9/5 =>> day 2  Zosyn restarted 9/3 =>> day 5  Daptomycin 12/25/16 =>> day 1 DVT: SCD/heparin =>>Xarelto per TF last dose last PM   Plan:  Restart tube feeds slowly, family asking about swallow evaluation and I recommended seeing how we do with tube feeds and reevaluate on Monday.      LOS: 28 days    Jacob Pittman 12/25/2016 [phone]

## 2016-12-25 NOTE — Progress Notes (Signed)
SLP Cancellation Note  Patient Details Name: Jacob Pittman MRN: [id number] DOB: March 03, 1946   Cancelled treatment:       Reason Eval/Treat Not Completed: Medical issues which prohibited therapy. Discussed pt with RN, who also spoke with surgical PA. Plan is for pt to initiate TFs again today. Surgical team does not want pt to have additional POs other than small amounts of ice or sips of water. RN reports that pt has already been getting this from nursing staff without overt incidence. Since pt cannot medically have more than this, SLP will hold on diagnostic POs at this time, but will check back in after the weekend to see how he has been tolerating his TFs. SLP services can be reached over the weekend at [phone] if needed.   [name] 12/25/2016, 3:13 PM  [name], M.A. CCC-SLP [phone]

## 2016-12-25 NOTE — Progress Notes (Signed)
  Echocardiogram 2D Echocardiogram has been performed.  Nolon Rod 12/25/2016, 3:08 PM

## 2016-12-25 NOTE — Progress Notes (Signed)
Nutrition Follow-up  DOCUMENTATION CODES:   Obesity unspecified  INTERVENTION:   Vital 1.5 at rate of 20 ml/hr. Goal rate is 55 ml/hr with Pro-Stat 30 mL BID prroviding 2180 kcals, 120 g of protein.    NUTRITION DIAGNOSIS:   Inadequate oral intake related to altered GI function as evidenced by NPO status.  Being addressed via TF  GOAL:   Patient will meet greater than or equal to 90% of their needs  Progressing  MONITOR:   Diet advancement, Labs, Weight trends, Skin, I & O's  REASON FOR ASSESSMENT:   Consult New TPN/TNA  ASSESSMENT:   71 y.o.M with CHF (EF 15%), HTN, DM, PAD, and OA. Pt admitted with peritonitis and septic shock secondary to perforated ulcer s/p exp lap oversew pyloric channel ulcer, graham omental patch repair for perforated ulcer. Pt transferred to  for brachial embolectomy 8/13.  TPN on hold at present due to MRSE bacteremia and pt without access for TPN  Consult received to start TF at low rate of 20 ml/hr. Cortrak tube in place  Labs: Creatinine 1.35, BUN 46, CBGs wdl, sodium 145 (wdl) Meds: reviewed  Diet Order:  Diet NPO time specified  Skin:  Wound (see comment) (stage II sacrum, unstageable heel, abdominal wound)  Last BM:  9/5  Height:   Ht Readings from Last 1 Encounters:  12/21/16 6\' 1"  (1.854 m)    Weight:   Wt Readings from Last 1 Encounters:  12/25/16 189 lb 9.5 oz (86 kg)    Ideal Body Weight:  70 kg  BMI:  Body mass index is 25.01 kg/m.  Estimated Nutritional Needs:   Kcal:  2020-2265 kcals  Protein:  100-115 g   Fluid:  >/= 2 L  EDUCATION NEEDS:   No education needs identified at this time  Romelle Starcher MS, RD, LDN [phone] Pager  [phone] Weekend/On-Call Pager

## 2016-12-25 NOTE — Progress Notes (Signed)
Pharmacy Antibiotic Note  Jacob Pittman is a 71 y.o. male admitted on 11/26/2016 with abdominal pain, found to have perforated pyloric channel ulcer. Patient has also now developed MRSE bacteremia.  Patient as been treated with vancomycin. Today cultures show that the Vancomycin MIC for his MRSE is 4. Data suggests that a higher MIC in coag negative staph may not an issue but given patient's clinical picture and presence of pacemaker, will switch to daptomycin. WBC today is WNL and patient afebrile. Repeat blood cultures from this morning are pending.    Plan: Daptomycin 500 mg  (~ 6 mg/kg) every 24 hours Baseline and Weekly CK levels Monitor renal function, cultures, clinical status   Height: 6\' 1"  (185.4 cm) Weight: 189 lb 9.5 oz (86 kg) IBW/kg (Calculated) : 79.9  Temp (24hrs), Avg:97.9 F (36.6 C), Min:97 F (36.1 C), Max:98.3 F (36.8 C)   Recent Labs Lab 12/21/16 2103 12/22/16 0514 12/23/16 0409 12/24/16 0448 12/24/16 0956 12/25/16 0221  WBC 7.3 7.1 7.4 7.9  --  9.8  CREATININE 0.94 1.02 1.31*  --  1.52* 1.35*  LATICACIDVEN 1.1  --   --   --   --   --     Estimated Creatinine Clearance: 55.1 mL/min (A) (by C-G formula based on SCr of 1.35 mg/dL (H)).    No Known Allergies  Antimicrobials this admission: 9/7 Dapto >> 8/10 Zosyn >> 8/22, 8/25>>8/26, 9/3 >>  8/10 Vancomycin x1, 9/5 >> 9/7 8/10 Diflucan >>8/15 8/15 Flagyl >>8/22 8/15 Biaxin >> 8/29 8/22 Amoxicillin>>8/25, 8/26 >> 8/29 8/25 Unasyn >>8/25    Microbiology results: 8/10 BCx: negative 8/10 Abd wound: normal skin flora 8/10 MRSA PCR: neg 8/9 BCx: neg 8/9 UCx: > 100K E coli R to Unaysn/amp, sens to all others 8/10 BCx: Neg 8/17 C. Diff: negative 8/18 Resp Cx: normal flora 8/17 UCx: neg 8/17 BCx: neg 8/23 BCx: neg 8/29 bcx: ngtd 9/3 bcx: MRSE in 2/4   Thank you for allowing pharmacy to be a part of this patient's care.  Della Goo, PharmD PGY2 Infectious Diseases Pharmacy  Resident Pager: [phone] 12/25/2016 10:12 AM

## 2016-12-25 NOTE — Plan of Care (Signed)
Problem: Physical Regulation: Goal: Ability to maintain clinical measurements within normal limits will improve Outcome: Progressing Patient had drain placed by IR today for abdominal abscess. Patient still not to baseline mental. Family meeting with palliative determined that they would watch and see how he did over the next 48 hours. Central line removed per MD order in case this was cause of positive blood cultures. New cultures obtained. Will await results.

## 2016-12-25 NOTE — Progress Notes (Signed)
[facility]  [facility] Infusion Coordinator will follow pt during this admission with ID team to support home infusion needs for IV ABX/TNA as ordered at DC.  If patient discharges after hours, please call [phone].   Sedalia Muta 12/25/2016, 12:34 PM

## 2016-12-25 NOTE — Progress Notes (Signed)
PROGRESS NOTE    Jacob Pittman  KXF:[id number] DOB: 12-14-1945 DOA: 11/26/2016 PCP: Biagio Borg, MD   Brief Narrative: Jacob Pittman is a 71 y.o. 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 Was found to have a perforated pyloric channel ulcer and pneumoperitonitis s/p omental patch repair subsequently on epinephrine and Levophed.  8/12-  TTE LV moderately dilated with EF 15% &diffuse hypokinesis. There is akinesis of the inferolateral and inferior myocardium.  E coli UTI noted 8/13-  right brachial A- line placed and then developed ischemic right arm- vascular consult and transfer from [facility] to [facility]- thrombectomy of brachial artery- heparin Paroxysmal A-fib also noted 8/14- Decreased attenuation involving the right dentate nucleus in the right cerebellum and immediately adjacent cerebellar parenchyma, concerning for recent and potentially acute infarct in this area.  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- anticoagulation recommended to be stopped for 10-14 days - H pyloli + started on triple therapy - Transaminitis thought to be du to Diflucan hepatotoxicity 8/17 - febrile again and vasopressors resumed 8/23- extubated 8/25 A-fib with HR up to 160s, fever 101.6 at 4 AM- care transferred to [facility] 9/3 - stool noted to be coming out of wound   Assessment & Plan:   Principal Problem:   Perforated duodenal bulb ulcer ([place]) Active Problems:   Acute on chronic combined systolic and diastolic CHF (congestive heart failure) ([place])   Nonischemic cardiomyopathy (HCC)   Pneumoperitoneum   Acute respiratory failure with hypoxia (HCC)   Paroxysmal A-fib ([place])   CVA (cerebral vascular accident) ([place])   Arterial thrombosis ([place])   H pylori ulcer   Fatty infiltration of liver   DM (diabetes mellitus), type 2 (HCC)   Pressure sore on heel, left, unstageable ([place])   Poor venous  access   Palliative care encounter   Goals of care, counseling/discussion   Perforated duodenal bulb ulcer Peritonitis H. Pylori ulcer Septic shock Abdominal Abscess Patient has had a prolonged hospital course secondary to above problems. Patient has received multiple antibiotic therapies. He now has evidence of a abscess tracking along sigmoid colon mesentery/omentum with drainage through the wound, evidence of wound necrosis. -General surgery recommendations -Palliative care recommendations: [place] discussion yesterday. Continue medical treatment for now -Interventional radiology recommendations -albumin to help with oncotic pressure  Methicillin resistant staph species bacteremia Seen on 9/3 blood cultures x2. IJ removed 12/24/16 -infectious disease recommendations: daptomycin -repeat blood cultures obtained 12/25/16 and are pending  Tachypnea Pulmonary edema on imaging. EF of 15%. Improved with lasix. Stable.  Acute on chronic systolic heart failure EF of 15%. Given IV fluids. Weight is down 6 lbs in the last three days. -strict in/out -daily weights -hold lasix today secondary to hypotension  Paroxysmal atrial fibrillation -continue metoprolol  Bilateral basilar atelectasis -continue to encourage incentive spirometry  Hypernatremia Improved slightly  Acute respiratory failure with hypoxia Previously intubated and successfully extubated. Secondary to sepsis.  CVA Likely embolic from atrial fibrillation -continue Xarelto -continue aspirin  Arterial thrombosis Right brachial artery. Secondary to A-line. S/p thrombectomy  Bilateral arm edema No DVT on ultrasound -Lasix as above  Fatty liver  Diabetes mellitus, type 2 A1c of 5.8% -continue SSI  Pacer firing Read as asystole which does not appear to be the case. No arrhythmia noted. -cardiology for device interrogation.   DVT prophylaxis: Xarelto Code Status: DNR Family Communication: None at  bedside Disposition Plan: Discharge pending  goals of care   Consultants:   General surgery  Interventional radiology  Palliative care medicine  Procedures:   8/10- ex lap- patch repair of pyloric ucler  8/13- brachial embolectomy 2 D ECHO Left ventricle: The cavity size was moderately dilated. Wallthickness was normal. Systolic function was severely reduced. Theestimated ejection fraction was 15%. Diffuse hypokinesis. Thereis akinesis of the inferolateral and inferior myocardium. Thestudy is not technically sufficient to allow evaluation of LVdiastolic function. - Aortic valve: There was mild regurgitation. - Mitral valve: There was moderate regurgitation. - Left atrium: The atrium was severely dilated. - Right ventricle: The cavity size was moderately dilated. Systolicfunction was moderately reduced. - Right atrium: The atrium was severely dilated. - Tricuspid valve: There was moderate regurgitation. - Pulmonary arteries: Systolic pressure was moderately increased.  Antimicrobials:  Amoxicillin  Unasyn  Clarithromycin  Metronidazole  Zosyn  Vancomycin  Daptomycin    Subjective: No abdominal pain.  Objective: Vitals:   12/25/16 0400 12/25/16 0500 12/25/16 0600 12/25/16 0700  BP: (!) 94/37 98/61 (!) 108/41 (!) 106/47  Pulse: 70 72 68 66  Resp: (!) 32 (!) 39 (!) 35 17  Temp:      TempSrc:      SpO2: 100% 100% 99% 97%  Weight:  86 kg (189 lb 9.5 oz)    Height:        Intake/Output Summary (Last 24 hours) at 12/25/16 0813 Last data filed at 12/25/16 0700  Gross per 24 hour  Intake          1076.67 ml  Output              635 ml  Net           441.67 ml   Filed Weights   12/23/16 0355 12/24/16 0453 12/25/16 0500  Weight: 88.1 kg (194 lb 3.6 oz) 87.7 kg (193 lb 5.5 oz) 86 kg (189 lb 9.5 oz)    Examination:  General exam: Appears calm and comfortable Respiratory system: Clear to auscultation with decreased breath sounds bilaterally.  Respiratory effort normal. Tachypnea. Cardiovascular system: S1 & S2 heard, RRR. 2/6 systolic murmur. Gastrointestinal system: Abdomen is nondistended, soft and nontender. Abdominal binder in place. Normal bowel sounds heard. Drain with serous drainage Central nervous system: Alert.  Extremities: Bilateral upper extremity edema. No calf tenderness Skin: No cyanosis. No rashes Psychiatry: Judgement and insight appear normal. Mood & affect depressed and flat.     Data Reviewed: I have personally reviewed following labs and imaging studies  CBC:  Recent Labs Lab 12/21/16 2103 12/22/16 0514 12/23/16 0409 12/24/16 0448 12/25/16 0221  WBC 7.3 7.1 7.4 7.9 9.8  NEUTROABS 5.4  --   --   --  8.2*  HGB 8.4* 8.4* 8.6* 8.5* 7.3*  HCT 27.3* 27.4* 28.1* 27.2* 23.5*  MCV 105.8* 105.8* 106.4* 106.7* 105.9*  PLT 247 246 291 304 007   Basic Metabolic Panel:  Recent Labs Lab 12/19/16 0638 12/20/16 0540 12/21/16 0421 12/21/16 2103 12/22/16 0514 12/23/16 0409 12/24/16 0956 12/25/16 0221  NA 147* 149* 147* 144 147* 146* 146* 145  K 3.8 3.9 3.7 4.1 4.1 4.1 3.8 3.5  CL 118* 118* 117* 113* 115* 113* 113* 112*  CO2 21* '22 22 24 24 23 23 22  '$ GLUCOSE 175* 175* 195* 100* 107* 135* 91 90  BUN 30* 32* 36* 33* 36* 36* 50* 46*  CREATININE 0.88 0.91 0.94 0.94 1.02 1.31* 1.52* 1.35*  CALCIUM 7.6* 7.5* 7.5* 7.4* 7.5* 7.4* 7.3* 7.4*  MG 2.5* 2.2 2.2  --   --   --   --  2.3  PHOS  --  2.3*  --   --   --  4.4  --  3.7   GFR: Estimated Creatinine Clearance: 55.1 mL/min (A) (by C-G formula based on SCr of 1.35 mg/dL (H)). Liver Function Tests:  Recent Labs Lab 12/20/16 0540 12/21/16 0421 12/22/16 0514 12/23/16 0409 12/25/16 0221  AST 46* 201* 149* 161* 70*  ALT 48 96* 125* 142* 99*  ALKPHOS 64 155* 156* 143* 130*  BILITOT 0.6 0.8 0.7 0.9 0.9  PROT 6.0* 5.8* 6.0* 6.2* 6.2*  ALBUMIN 1.7* 1.6* 1.6* 1.7* 1.8*   No results for input(s): LIPASE, AMYLASE in the last 168 hours. No results for  input(s): AMMONIA in the last 168 hours. Coagulation Profile:  Recent Labs Lab 12/23/16 0409  INR 1.28   Cardiac Enzymes: No results for input(s): CKTOTAL, CKMB, CKMBINDEX, TROPONINI in the last 168 hours. BNP (last 3 results) No results for input(s): PROBNP in the last 8760 hours. HbA1C: No results for input(s): HGBA1C in the last 72 hours. CBG:  Recent Labs Lab 12/24/16 1332 12/24/16 1632 12/24/16 2007 12/25/16 0016 12/25/16 0317  GLUCAP 73 81 98 90 98   Lipid Profile:  Recent Labs  12/25/16 0221  TRIG 189*   Thyroid Function Tests: No results for input(s): TSH, T4TOTAL, FREET4, T3FREE, THYROIDAB in the last 72 hours. Anemia Panel: No results for input(s): VITAMINB12, FOLATE, FERRITIN, TIBC, IRON, RETICCTPCT in the last 72 hours. Sepsis Labs:  Recent Labs Lab 12/21/16 2103  LATICACIDVEN 1.1    Recent Results (from the past 240 hour(s))  Culture, blood (routine x 2)     Status: None   Collection Time: 12/16/16  4:54 AM  Result Value Ref Range Status   Specimen Description BLOOD LEFT HAND  Final   Special Requests IN PEDIATRIC BOTTLE Blood Culture adequate volume  Final   Culture NO GROWTH 5 DAYS  Final   Report Status 12/21/2016 FINAL  Final  Culture, blood (routine x 2)     Status: None   Collection Time: 12/16/16  4:59 AM  Result Value Ref Range Status   Specimen Description BLOOD LEFT HAND  Final   Special Requests IN PEDIATRIC BOTTLE Blood Culture adequate volume  Final   Culture NO GROWTH 5 DAYS  Final   Report Status 12/21/2016 FINAL  Final  Culture, blood (Routine X 2) w Reflex to ID Panel     Status: Abnormal   Collection Time: 12/21/16  6:32 PM  Result Value Ref Range Status   Specimen Description BLOOD LEFT HAND  Final   Special Requests   Final    BOTTLES DRAWN AEROBIC ONLY Blood Culture adequate volume   Culture  Setup Time   Final    GRAM POSITIVE COCCI IN CLUSTERS AEROBIC BOTTLE ONLY CRITICAL RESULT CALLED TO, READ BACK BY AND  VERIFIED WITH: K COOK PHARMD [id number] 12/22/16 A BROWNING    Culture (A)  Final    STAPHYLOCOCCUS EPIDERMIDIS SUSCEPTIBILITIES PERFORMED ON PREVIOUS CULTURE WITHIN THE LAST 5 DAYS.    Report Status 12/25/2016 FINAL  Final  Blood Culture ID Panel (Reflexed)     Status: Abnormal   Collection Time: 12/21/16  6:32 PM  Result Value Ref Range Status   Enterococcus species NOT DETECTED NOT DETECTED Final   Listeria monocytogenes NOT DETECTED NOT DETECTED Final   Staphylococcus species DETECTED (A) NOT DETECTED Final    Comment: Methicillin (oxacillin)  resistant coagulase negative staphylococcus. Possible blood culture contaminant (unless isolated from more than one blood culture draw or clinical case suggests pathogenicity). No antibiotic treatment is indicated for blood  culture contaminants. CRITICAL RESULT CALLED TO, READ BACK BY AND VERIFIED WITH: K COOK PHARMD [id number] 12/22/16 A BROWNING    Staphylococcus aureus NOT DETECTED NOT DETECTED Final   Methicillin resistance DETECTED (A) NOT DETECTED Final    Comment: CRITICAL RESULT CALLED TO, READ BACK BY AND VERIFIED WITH: K COOK PHARMD [id number] 12/22/16 A BROWNING    Streptococcus species NOT DETECTED NOT DETECTED Final   Streptococcus agalactiae NOT DETECTED NOT DETECTED Final   Streptococcus pneumoniae NOT DETECTED NOT DETECTED Final   Streptococcus pyogenes NOT DETECTED NOT DETECTED Final   Acinetobacter baumannii NOT DETECTED NOT DETECTED Final   Enterobacteriaceae species NOT DETECTED NOT DETECTED Final   Enterobacter cloacae complex NOT DETECTED NOT DETECTED Final   Escherichia coli NOT DETECTED NOT DETECTED Final   Klebsiella oxytoca NOT DETECTED NOT DETECTED Final   Klebsiella pneumoniae NOT DETECTED NOT DETECTED Final   Proteus species NOT DETECTED NOT DETECTED Final   Serratia marcescens NOT DETECTED NOT DETECTED Final   Haemophilus influenzae NOT DETECTED NOT DETECTED Final   Neisseria meningitidis NOT DETECTED NOT DETECTED Final    Pseudomonas aeruginosa NOT DETECTED NOT DETECTED Final   Candida albicans NOT DETECTED NOT DETECTED Final   Candida glabrata NOT DETECTED NOT DETECTED Final   Candida krusei NOT DETECTED NOT DETECTED Final   Candida parapsilosis NOT DETECTED NOT DETECTED Final   Candida tropicalis NOT DETECTED NOT DETECTED Final  Culture, blood (Routine X 2) w Reflex to ID Panel     Status: Abnormal   Collection Time: 12/21/16  6:38 PM  Result Value Ref Range Status   Specimen Description BLOOD RIGHT ARM  Final   Special Requests IN PEDIATRIC BOTTLE Blood Culture adequate volume  Final   Culture  Setup Time   Final    GRAM POSITIVE COCCI IN CLUSTERS IN PEDIATRIC BOTTLE CRITICAL VALUE NOTED.  VALUE IS CONSISTENT WITH PREVIOUSLY REPORTED AND CALLED VALUE.    Culture STAPHYLOCOCCUS EPIDERMIDIS (A)  Final   Report Status 12/25/2016 FINAL  Final   Organism ID, Bacteria STAPHYLOCOCCUS EPIDERMIDIS  Final      Susceptibility   Staphylococcus epidermidis - MIC*    CIPROFLOXACIN >=8 RESISTANT Resistant     ERYTHROMYCIN >=8 RESISTANT Resistant     GENTAMICIN 8 INTERMEDIATE Intermediate     OXACILLIN RESISTANT Resistant     TETRACYCLINE 2 SENSITIVE Sensitive     VANCOMYCIN 4 SENSITIVE Sensitive     TRIMETH/SULFA 160 RESISTANT Resistant     CLINDAMYCIN >=8 RESISTANT Resistant     RIFAMPIN <=0.5 SENSITIVE Sensitive     Inducible Clindamycin NEGATIVE Sensitive     * STAPHYLOCOCCUS EPIDERMIDIS  Surgical pcr screen     Status: None   Collection Time: 12/21/16  9:33 PM  Result Value Ref Range Status   MRSA, PCR NEGATIVE NEGATIVE Final   Staphylococcus aureus NEGATIVE NEGATIVE Final    Comment: (NOTE) The Xpert SA Assay (FDA approved for NASAL specimens in patients 64 years of age and older), is one component of a comprehensive surveillance program. It is not intended to diagnose infection nor to guide or monitor treatment.   Body fluid culture     Status: None (Preliminary result)   Collection Time:  12/24/16 12:13 PM  Result Value Ref Range Status   Specimen Description FLUID  Final   Special Requests  ASCITIES  Final   Gram Stain   Final    RARE WBC PRESENT,BOTH PMN AND MONONUCLEAR NO ORGANISMS SEEN    Culture PENDING  Incomplete   Report Status PENDING  Incomplete         Radiology Studies: Ct Image Guided Drainage By Percutaneous Catheter  Result Date: 12/24/2016 INDICATION: 71 year old male with postoperative intra-abdominal fluid collection and spontaneous drainage from his midline wound. EXAM: CT GUIDED DRAINAGE OF  ABSCESS MEDICATIONS: The patient is currently admitted to the hospital and receiving intravenous antibiotics. The antibiotics were administered within an appropriate time frame prior to the initiation of the procedure. ANESTHESIA/SEDATION: 1 mg IV Versed 50 mcg IV Fentanyl Moderate Sedation Time:  15 minutes The patient was continuously monitored during the procedure by the interventional radiology nurse under my direct supervision. COMPLICATIONS: None immediate. TECHNIQUE: Informed written consent was obtained from the patient after a thorough discussion of the procedural risks, benefits and alternatives. All questions were addressed. A timeout was performed prior to the initiation of the procedure. PROCEDURE: A planning axial CT scan was performed. The intra-abdominal fluid collection is significantly decreased in size compared to 12/21/2016 likely due to continued drainage through the midline abdominal wound. There is still sufficient fluid to warrant drain placement. Therefore, the overlying skin was marked and sterilely prepped and draped in standard fashion using chlorhexidine skin prep. Local anesthesia was attained by infiltration with 1% lidocaine. Under intermittent CT guidance, a 18 gauge trocar needle was advanced into the fluid collection. A 0.035 wire was then advanced the needle and coiled within the fluid collection. The skin tract was dilated to 10 [place] and a  Cook 10.2 [place] all-purpose drainage catheter was advanced over the wire and positioned in the fluid collection. Aspiration yields approximately 10 mL of serosanguineous fluid. No purulence or foul odor. The catheter was secured to the skin with 0 Prolene suture and connected to JP bulb suction. FINDINGS: Serosanguineous fluid. IMPRESSION: Successful placement of a 10 French drainage catheter. Aspiration yields serosanguineous fluid. A sample was sent for Gram stain and culture. PLAN: Maintain tube to JP bulb suction and flush every shift. Recommend repeat CT scan of the abdomen and pelvis prior to drain removal. Signed, Criselda Peaches, MD [facility] Radiology Electronically Signed   By: Jacqulynn Cadet M.D.   On: 12/24/2016 17:31        Scheduled Meds: . aspirin  81 mg Per Tube Daily  . chlorhexidine   Topical Once  . chlorhexidine gluconate (MEDLINE KIT)  15 mL Mouth Rinse BID  . enoxaparin (LOVENOX) injection  85 mg Subcutaneous Q12H  . insulin aspart  0-20 Units Subcutaneous Q4H  . lidocaine (PF)  5 mL Intradermal Once  . mouth rinse  15 mL Mouth Rinse QID  . pantoprazole (PROTONIX) IV  40 mg Intravenous Q12H  . sodium chloride flush  5 mL Intravenous Q8H  . sodium hypochlorite   Irrigation BID   Continuous Infusions: . sodium chloride Stopped (12/14/16 0953)  . sodium chloride 10 mL/hr at 12/23/16 2110  . dextrose 5 % and 0.9% NaCl 50 mL/hr at 12/24/16 1758  . piperacillin-tazobactam (ZOSYN)  IV 3.375 g (12/25/16 0556)  . vancomycin Stopped (12/25/16 0654)     LOS: 28 days     Cordelia Poche, MD [facility] 12/25/2016, 8:13 AM Pager: [phone]  If 7PM-7AM, please contact night-coverage [email or web address] Password [facility] 12/25/2016, 8:13 AM

## 2016-12-25 NOTE — Telephone Encounter (Signed)
Patient daughter called and wanted to know if patient device shocked him. Patient daughter informed me that patient has been in hospital for 30 days. I informed patient daughter that we are unable to see if his ICD shocked him b/c he has not been near his home monitor and that is how his device communicates with our office. I informed daughter that when the hospital MD comes to see patient today that she needs to request that his ICD be checked by a Set designer. I also, informed her that she will need to request that patients cardiologist gets involved with patient care. Pt daughter verbalized understanding.

## 2016-12-25 NOTE — Progress Notes (Signed)
Referring Physician(s): Dr Lonny Prude  Supervising Physician: Jacqulynn Cadet  Patient Status:  [facility] - In-pt  Chief Complaint: Perforated pyloric ulcer Exploratory lap; graham patch repair- 11/27/2016 Fascial dehiscence Intra abdominal fluid collection   Subjective:  Drain placed 9/5 Output is minimal Wbc wnl afeb   Allergies: Patient has no known allergies.  Medications: Prior to Admission medications   Medication Sig Start Date End Date Taking? Authorizing Provider  aspirin 81 MG EC tablet Take 81 mg by mouth daily.     Yes [provider]  carvedilol (COREG) 12.5 MG tablet TAKE ONE TABLET BY MOUTH TWICE DAILY 04/27/16  Yes Martinique, Peter M, MD  fluticasone [facility]) 50 MCG/ACT nasal spray Place 2 sprays into both nostrils daily. 09/22/13  Yes Biagio Borg, MD  furosemide (LASIX) 40 MG tablet Take 40 mg by mouth daily.   Yes [provider]  metFORMIN (GLUCOPHAGE-XR) 500 MG 24 hr tablet TAKE TWO TABLETS BY MOUTH ONCE DAILY IN THE MORNING 03/18/16  Yes Biagio Borg, MD  metolazone (ZAROXOLYN) 2.5 MG tablet TAKE ONE TABLET BY MOUTH EVERY OTHER DAY 01/10/16  Yes Martinique, Peter M, MD  rosuvastatin (CRESTOR) 20 MG tablet Take 1 tablet (20 mg total) by mouth daily. 09/10/16  Yes Biagio Borg, MD  albuterol (PROVENTIL HFA;VENTOLIN HFA) 108 (90 BASE) MCG/ACT inhaler Inhale 2 puffs into the lungs every 6 (six) hours as needed for wheezing or shortness of breath. 07/19/14   Biagio Borg, MD  Blood Glucose Monitoring Suppl (ONE TOUCH ULTRA SYSTEM KIT) w/Device KIT Use as directed daily 09/10/16   Biagio Borg, MD  cetirizine (ZYRTEC) 10 MG tablet Take 1 tablet (10 mg total) by mouth daily. Patient not taking: Reported on 11/26/2016 09/22/13   Biagio Borg, MD  furosemide (LASIX) 40 MG tablet Take 1 tablet (40 mg total) by mouth daily. Patient not taking: Reported on 11/26/2016 07/08/16 10/15/16  Martinique, Peter M, MD  glucose blood test strip Use as instructed 09/10/16   Biagio Borg, MD  Lancets MISC Use as directed once daily 09/10/16   Biagio Borg, MD  sildenafil (REVATIO) 20 MG tablet Take 1 tablet (20 mg total) by mouth as directed. Take 2-4 tablets by mouth as needed Patient taking differently: Take 40 mg by mouth daily as needed (ed).  10/15/16   Evans Lance, MD  silver sulfADIAZINE (SILVADENE) 1 % cream Apply 1 application topically daily. Patient not taking: Reported on 11/26/2016 10/16/15   Gean Birchwood, DPM  spironolactone (ALDACTONE) 25 MG tablet Take 0.5 tablets (12.5 mg total) by mouth daily. Patient not taking: Reported on 11/26/2016 06/08/16   Martinique, Peter M, MD  Tadalafil 2.5 MG TABS Take 1 tablet (2.5 mg total) by mouth daily as needed. Patient taking differently: Take 2.5 mg by mouth daily as needed (erectile dysfunction).  03/04/15   Brett Canales, PA-C     Vital Signs: BP (!) 99/31 (BP Location: Right Leg)   Pulse 71   Temp (!) 97 F (36.1 C) (Axillary)   Resp (!) 33   Ht '6\' 1"'$  (1.854 m)   Wt 189 lb 9.5 oz (86 kg)   SpO2 100%   BMI 25.01 kg/m   Physical Exam  Abdominal: Soft.  Neurological: He is alert.  Skin: Skin is warm and dry.  Site of drain is clean and dry NT No bleeding OP is blood tinged 10 cc yesterday; 10 cc in Neligh note and  vitals reviewed.   Imaging: Dg Chest Port 1 View  Result Date: 12/23/2016 CLINICAL DATA:  Shortness of breath; chronic CHF, cardiomyopathy, peripheral vascular disease EXAM: PORTABLE CHEST 1 VIEW COMPARISON:  Portable chest x-ray of December 22, 2016 FINDINGS: The lungs are reasonably well inflated. Bibasilar densities are less conspicuous today. There are small bilateral pleural effusions. The cardiac silhouette remains enlarged. The pulmonary vascularity is less engorged. The ICD is in stable position. The feeding tube tip projects below the inferior margin of the image. A left internal jugular venous catheter tip projects in the midportion of the SVC. There is calcification in the  wall of the aortic arch. IMPRESSION: Decreased pulmonary interstitial edema. Decreased bibasilar atelectasis or pneumonia. Persistent mild CHF with small effusions. Electronically Signed   By: David  Martinique M.D.   On: 12/23/2016 08:27   Dg Chest Port 1 View  Result Date: 12/22/2016 CLINICAL DATA:  Shortness of Breath EXAM: PORTABLE CHEST 1 VIEW COMPARISON:  12/21/2016 FINDINGS: Left AICD remains in place, unchanged. Cardiomegaly with bilateral airspace opacities, right greater than left. Small bilateral effusions. Overall air perforation and the right lung worsened since prior study. IMPRESSION: Worsening bilateral airspace disease, right greater than left which could represent asymmetric edema or infection. Small effusions. Electronically Signed   By: Rolm Baptise M.D.   On: 12/22/2016 08:46   Dg Chest Port 1 View  Result Date: 12/21/2016 CLINICAL DATA:  Central line placement EXAM: PORTABLE CHEST 1 VIEW COMPARISON:  12/21/2016 at 6:12 FINDINGS: There is a new left jugular central line, extending to the low SVC. No pneumothorax. Unchanged cardiomegaly. Grossly intact transvenous cardiac leads. Mild improvement, with partial clearance of central/basilar ground-glass opacities. IMPRESSION: 1. New left jugular central line appears satisfactorily positioned. No pneumothorax. 2. Unchanged cardiomegaly. Partial clearance of central and basilar airspace opacities. Electronically Signed   By: Andreas Newport M.D.   On: 12/21/2016 21:37   Ct Image Guided Drainage By Percutaneous Catheter  Result Date: 12/24/2016 INDICATION: 71 year old male with postoperative intra-abdominal fluid collection and spontaneous drainage from his midline wound. EXAM: CT GUIDED DRAINAGE OF  ABSCESS MEDICATIONS: The patient is currently admitted to the hospital and receiving intravenous antibiotics. The antibiotics were administered within an appropriate time frame prior to the initiation of the procedure. ANESTHESIA/SEDATION: 1 mg IV  Versed 50 mcg IV Fentanyl Moderate Sedation Time:  15 minutes The patient was continuously monitored during the procedure by the interventional radiology nurse under my direct supervision. COMPLICATIONS: None immediate. TECHNIQUE: Informed written consent was obtained from the patient after a thorough discussion of the procedural risks, benefits and alternatives. All questions were addressed. A timeout was performed prior to the initiation of the procedure. PROCEDURE: A planning axial CT scan was performed. The intra-abdominal fluid collection is significantly decreased in size compared to 12/21/2016 likely due to continued drainage through the midline abdominal wound. There is still sufficient fluid to warrant drain placement. Therefore, the overlying skin was marked and sterilely prepped and draped in standard fashion using chlorhexidine skin prep. Local anesthesia was attained by infiltration with 1% lidocaine. Under intermittent CT guidance, a 18 gauge trocar needle was advanced into the fluid collection. A 0.035 wire was then advanced the needle and coiled within the fluid collection. The skin tract was dilated to 10 [place] and a Cook 10.2 [place] all-purpose drainage catheter was advanced over the wire and positioned in the fluid collection. Aspiration yields approximately 10 mL of serosanguineous fluid. No purulence or foul odor. The catheter was secured  to the skin with 0 Prolene suture and connected to JP bulb suction. FINDINGS: Serosanguineous fluid. IMPRESSION: Successful placement of a 10 French drainage catheter. Aspiration yields serosanguineous fluid. A sample was sent for Gram stain and culture. PLAN: Maintain tube to JP bulb suction and flush every shift. Recommend repeat CT scan of the abdomen and pelvis prior to drain removal. Signed, Criselda Peaches, MD [facility] Radiology Electronically Signed   By: Jacqulynn Cadet M.D.   On: 12/24/2016 17:31     Labs:  CBC:  Recent Labs  12/22/16 0514 12/23/16 0409 12/24/16 0448 12/25/16 0221  WBC 7.1 7.4 7.9 9.8  HGB 8.4* 8.6* 8.5* 7.3*  HCT 27.4* 28.1* 27.2* 23.5*  PLT 246 291 304 356    COAGS:  Recent Labs  11/26/16 2315 11/30/16 1036 12/23/16 0409  INR 1.23 1.35 1.28  APTT  --  36 25    BMP:  Recent Labs  12/22/16 0514 12/23/16 0409 12/24/16 0956 12/25/16 0221  NA 147* 146* 146* 145  K 4.1 4.1 3.8 3.5  CL 115* 113* 113* 112*  CO2 '24 23 23 22  '$ GLUCOSE 107* 135* 91 90  BUN 36* 36* 50* 46*  CALCIUM 7.5* 7.4* 7.3* 7.4*  CREATININE 1.02 1.31* 1.52* 1.35*  GFRNONAA >60 52* 44* 50*  GFRAA >60 >60 51* 59*    LIVER FUNCTION TESTS:  Recent Labs  12/21/16 0421 12/22/16 0514 12/23/16 0409 12/25/16 0221  BILITOT 0.8 0.7 0.9 0.9  AST 201* 149* 161* 70*  ALT 96* 125* 142* 99*  ALKPHOS 155* 156* 143* 130*  PROT 5.8* 6.0* 6.2* 6.2*  ALBUMIN 1.6* 1.6* 1.7* 1.8*    Assessment and Plan:  Intra abd fluid collection Drain intact Will follow   Electronically Signed: Metztli Sachdev A, PA-C 12/25/2016, 2:29 PM   I spent a total of 15 Minutes at the the patient's bedside AND on the patient's hospital floor or unit, greater than 50% of which was counseling/coordinating care for abscess drain

## 2016-12-25 NOTE — Progress Notes (Signed)
Pt Left IJ central venous catheter removed per physician order.  No issues during removal, catheter intact. Pt vital signs remained stable throughout procedure. Occlusive dressing in place and pt placed in supine position with HOB flat. Pt and family at bedside directed to keep pt in this position for a period of 30 minutes.  Will continue to monitor.

## 2016-12-25 NOTE — Progress Notes (Addendum)
PHARMACY - ADULT TOTAL PARENTERAL NUTRITION CONSULT NOTE   Pharmacy Consult:  TPN Indication:  Possible fistula  Patient Measurements: Height: 6\' 1"  (185.4 cm) Weight: 189 lb 9.5 oz (86 kg) IBW/kg (Calculated) : 79.9 TPN AdjBW (KG): 82.3 Body mass index is 25.01 kg/m.  Assessment:  61 YOM presented 11/26/16 with gastric ulcer perforation and peritonitis.  He underwent emergent ex-lap with omental patch repair on 11/27/16.  Had multi-organ failure post-op requiring intubation and pressors. Acute ischemia of right hand on 8/13 requiring transfer to [facility] for R brachial ulner/radial artery embolectomy.  Pharmacy consulted to start TPN for post-op ileus on 11/30/16.  She came off of TPN on 12/03/16 and was tolerating tube feeding (last charted on 12/20/16).    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  Pharmacy consulted to resume TPN on 12/24/16; however, he has bacteremia and TPN is postponed.   Nutritional Goals (per RD rec on 9/6): 2020-2265 kCal and 100-115 gm protein per day  Current Nutrition:  NPO   Plan:  Hold TPN given MRSE bacteremia and patient is without access for TPN.  Noted plan to replace central line when culture is negative x 3 days.  Please re-consult when ready to resume TPN.   Camelia Stelzner D. Laney Potash, PharmD, BCPS Pager:  [phone] 12/25/2016, 11:34 AM

## 2016-12-25 NOTE — Care Management Important Message (Signed)
Important Message  Patient Details  Name: Jacob Pittman MRN: [id number] Date of Birth: October 19, 1945   Medicare Important Message Given:  Yes    Kyla Balzarine 12/25/2016, 9:29 AM

## 2016-12-25 NOTE — Progress Notes (Signed)
INFECTIOUS DISEASE PROGRESS NOTE  ID: Jacob Pittman is a 72 y.o. male with  Principal Problem:   Perforated duodenal bulb ulcer ([place]) Active Problems:   Acute on chronic combined systolic and diastolic CHF (congestive heart failure) (HCC)   Nonischemic cardiomyopathy (HCC)   Pneumoperitoneum   Acute respiratory failure with hypoxia (HCC)   Paroxysmal A-fib (HCC)   CVA (cerebral vascular accident) ([place])   Arterial thrombosis (HCC)   H pylori ulcer   Fatty infiltration of liver   DM (diabetes mellitus), type 2 (HCC)   Pressure sore on heel, left, unstageable ([place])   Poor venous access   Palliative care encounter   Goals of care, counseling/discussion  Subjective: Without complaints.  More awake and alert, interactive.   Abtx:  Anti-infectives    Start     Dose/Rate Route Frequency Ordered Stop   12/23/16 1800  vancomycin (VANCOCIN) IVPB 750 mg/150 ml premix     750 mg 150 mL/hr over 60 Minutes Intravenous Every 12 hours 12/23/16 0310     12/23/16 0145  vancomycin (VANCOCIN) 1,750 mg in sodium chloride 0.9 % 500 mL IVPB     1,750 mg 250 mL/hr over 120 Minutes Intravenous  Once 12/23/16 0138 12/23/16 0353   12/21/16 1500  piperacillin-tazobactam (ZOSYN) IVPB 3.375 g     3.375 g 12.5 mL/hr over 240 Minutes Intravenous Every 8 hours 12/21/16 1456     12/13/16 1000  amoxicillin (AMOXIL) 250 MG/5ML suspension 1,000 mg     1,000 mg Per Tube Every 12 hours 12/13/16 0935 12/16/16 1833   12/12/16 1900  piperacillin-tazobactam (ZOSYN) IVPB 3.375 g  Status:  Discontinued     3.375 g 12.5 mL/hr over 240 Minutes Intravenous Every 8 hours 12/12/16 1759 12/13/16 0911   12/12/16 1400  Ampicillin-Sulbactam (UNASYN) 3 g in sodium chloride 0.9 % 100 mL IVPB  Status:  Discontinued     3 g 200 mL/hr over 30 Minutes Intravenous Every 6 hours 12/12/16 1215 12/12/16 1753   12/10/16 0600  amoxicillin (AMOXIL) 250 MG/5ML suspension 1,000 mg  Status:  Discontinued     1,000 mg Per Tube Every 12  hours 12/09/16 1701 12/12/16 1206   12/09/16 1130  amoxicillin (AMOXIL) 250 MG/5ML suspension 1,000 mg  Status:  Discontinued     1,000 mg Oral Every 12 hours 12/09/16 1117 12/09/16 1118   12/09/16 1130  amoxicillin (AMOXIL) 250 MG/5ML suspension 1,000 mg  Status:  Discontinued     1,000 mg Per Tube Every 12 hours 12/09/16 1118 12/09/16 1701   12/04/16 2200  clarithromycin (BIAXIN) tablet 500 mg     500 mg Per Tube Every 12 hours 12/04/16 1107 12/16/16 2158   12/04/16 2000  piperacillin-tazobactam (ZOSYN) IVPB 3.375 g     3.375 g 12.5 mL/hr over 240 Minutes Intravenous Every 8 hours 12/04/16 1339 12/08/16 2359   12/04/16 1600  metroNIDAZOLE (FLAGYL) tablet 500 mg  Status:  Discontinued     500 mg Per Tube Every 8 hours 12/04/16 1107 12/09/16 1117   12/02/16 1100  clarithromycin (BIAXIN) 250 MG/5ML suspension 500 mg  Status:  Discontinued     500 mg Per Tube Every 12 hours 12/02/16 1012 12/04/16 1107   12/02/16 1045  metroNIDAZOLE (FLAGYL) 50 mg/ml oral suspension 500 mg  Status:  Discontinued     500 mg Per Tube 3 times daily 12/02/16 1036 12/04/16 1107   12/02/16 1015  metroNIDAZOLE (FLAGYL) 50 mg/ml oral suspension 250 mg  Status:  Discontinued  250 mg Per Tube 4 times daily 12/02/16 1011 12/02/16 1036   11/28/16 0800  fluconazole (DIFLUCAN) IVPB 200 mg  Status:  Discontinued     200 mg 100 mL/hr over 60 Minutes Intravenous Every 24 hours 11/27/16 0624 12/02/16 1018   11/27/16 0630  fluconazole (DIFLUCAN) IVPB 400 mg     400 mg 100 mL/hr over 120 Minutes Intravenous  Once 11/27/16 0622 11/27/16 0841   11/27/16 0600  piperacillin-tazobactam (ZOSYN) IVPB 3.375 g  Status:  Discontinued     3.375 g 12.5 mL/hr over 240 Minutes Intravenous Every 8 hours 11/27/16 0534 12/04/16 1339   11/27/16 0515  piperacillin-tazobactam (ZOSYN) IVPB 3.375 g  Status:  Discontinued     3.375 g 100 mL/hr over 30 Minutes Intravenous  Once 11/27/16 0510 11/27/16 0518   11/27/16 0030  piperacillin-tazobactam  (ZOSYN) IVPB 3.375 g     3.375 g 100 mL/hr over 30 Minutes Intravenous  Once 11/27/16 0016 11/27/16 0755   11/27/16 0030  vancomycin (VANCOCIN) IVPB 1000 mg/200 mL premix     1,000 mg 200 mL/hr over 60 Minutes Intravenous  Once 11/27/16 0016 11/27/16 0756      Medications:  Scheduled: . aspirin  81 mg Per Tube Daily  . chlorhexidine   Topical Once  . chlorhexidine gluconate (MEDLINE KIT)  15 mL Mouth Rinse BID  . enoxaparin (LOVENOX) injection  85 mg Subcutaneous Q12H  . insulin aspart  0-20 Units Subcutaneous Q4H  . lidocaine (PF)  5 mL Intradermal Once  . mouth rinse  15 mL Mouth Rinse QID  . pantoprazole (PROTONIX) IV  40 mg Intravenous Q12H  . sodium chloride flush  5 mL Intravenous Q8H  . sodium hypochlorite   Irrigation BID    Objective: Vital signs in last 24 hours: Temp:  [97 F (36.1 C)-98.3 F (36.8 C)] 98 F (36.7 C) (09/07 0800) Pulse Rate:  [57-83] 72 (09/07 0800) Resp:  [17-39] 26 (09/07 0800) BP: (67-113)/(25-77) 108/44 (09/07 0800) SpO2:  [97 %-100 %] 100 % (09/07 0800) Weight:  [86 kg (189 lb 9.5 oz)] 86 kg (189 lb 9.5 oz) (09/07 0500)   General appearance: alert, cooperative and no distress Resp: clear to auscultation bilaterally Chest wall: no tenderness, no fluctuance at ICD site.  Cardio: regular rate and rhythm GI: normal findings: bowel sounds normal, soft, non-tender and dressings not removed.  Extremities: edema 3+ BUE. L hand cooler vs R.   Lab Results  Recent Labs  12/24/16 0448 12/24/16 0956 12/25/16 0221  WBC 7.9  --  9.8  HGB 8.5*  --  7.3*  HCT 27.2*  --  23.5*  NA  --  146* 145  K  --  3.8 3.5  CL  --  113* 112*  CO2  --  23 22  BUN  --  50* 46*  CREATININE  --  1.52* 1.35*   Liver Panel  Recent Labs  12/23/16 0409 12/25/16 0221  PROT 6.2* 6.2*  ALBUMIN 1.7* 1.8*  AST 161* 70*  ALT 142* 99*  ALKPHOS 143* 130*  BILITOT 0.9 0.9   Sedimentation Rate No results for input(s): ESRSEDRATE in the last 72  hours. C-Reactive Protein No results for input(s): CRP in the last 72 hours.  Microbiology: Recent Results (from the past 240 hour(s))  Culture, blood (routine x 2)     Status: None   Collection Time: 12/16/16  4:54 AM  Result Value Ref Range Status   Specimen Description BLOOD LEFT HAND  Final  Special Requests IN PEDIATRIC BOTTLE Blood Culture adequate volume  Final   Culture NO GROWTH 5 DAYS  Final   Report Status 12/21/2016 FINAL  Final  Culture, blood (routine x 2)     Status: None   Collection Time: 12/16/16  4:59 AM  Result Value Ref Range Status   Specimen Description BLOOD LEFT HAND  Final   Special Requests IN PEDIATRIC BOTTLE Blood Culture adequate volume  Final   Culture NO GROWTH 5 DAYS  Final   Report Status 12/21/2016 FINAL  Final  Culture, blood (Routine X 2) w Reflex to ID Panel     Status: Abnormal   Collection Time: 12/21/16  6:32 PM  Result Value Ref Range Status   Specimen Description BLOOD LEFT HAND  Final   Special Requests   Final    BOTTLES DRAWN AEROBIC ONLY Blood Culture adequate volume   Culture  Setup Time   Final    GRAM POSITIVE COCCI IN CLUSTERS AEROBIC BOTTLE ONLY CRITICAL RESULT CALLED TO, READ BACK BY AND VERIFIED WITH: K COOK PHARMD [id number] 12/22/16 A BROWNING    Culture (A)  Final    STAPHYLOCOCCUS EPIDERMIDIS SUSCEPTIBILITIES PERFORMED ON PREVIOUS CULTURE WITHIN THE LAST 5 DAYS.    Report Status 12/25/2016 FINAL  Final  Blood Culture ID Panel (Reflexed)     Status: Abnormal   Collection Time: 12/21/16  6:32 PM  Result Value Ref Range Status   Enterococcus species NOT DETECTED NOT DETECTED Final   Listeria monocytogenes NOT DETECTED NOT DETECTED Final   Staphylococcus species DETECTED (A) NOT DETECTED Final    Comment: Methicillin (oxacillin) resistant coagulase negative staphylococcus. Possible blood culture contaminant (unless isolated from more than one blood culture draw or clinical case suggests pathogenicity). No antibiotic treatment is  indicated for blood  culture contaminants. CRITICAL RESULT CALLED TO, READ BACK BY AND VERIFIED WITH: K COOK PHARMD [id number] 12/22/16 A BROWNING    Staphylococcus aureus NOT DETECTED NOT DETECTED Final   Methicillin resistance DETECTED (A) NOT DETECTED Final    Comment: CRITICAL RESULT CALLED TO, READ BACK BY AND VERIFIED WITH: K COOK PHARMD [id number] 12/22/16 A BROWNING    Streptococcus species NOT DETECTED NOT DETECTED Final   Streptococcus agalactiae NOT DETECTED NOT DETECTED Final   Streptococcus pneumoniae NOT DETECTED NOT DETECTED Final   Streptococcus pyogenes NOT DETECTED NOT DETECTED Final   Acinetobacter baumannii NOT DETECTED NOT DETECTED Final   Enterobacteriaceae species NOT DETECTED NOT DETECTED Final   Enterobacter cloacae complex NOT DETECTED NOT DETECTED Final   Escherichia coli NOT DETECTED NOT DETECTED Final   Klebsiella oxytoca NOT DETECTED NOT DETECTED Final   Klebsiella pneumoniae NOT DETECTED NOT DETECTED Final   Proteus species NOT DETECTED NOT DETECTED Final   Serratia marcescens NOT DETECTED NOT DETECTED Final   Haemophilus influenzae NOT DETECTED NOT DETECTED Final   Neisseria meningitidis NOT DETECTED NOT DETECTED Final   Pseudomonas aeruginosa NOT DETECTED NOT DETECTED Final   Candida albicans NOT DETECTED NOT DETECTED Final   Candida glabrata NOT DETECTED NOT DETECTED Final   Candida krusei NOT DETECTED NOT DETECTED Final   Candida parapsilosis NOT DETECTED NOT DETECTED Final   Candida tropicalis NOT DETECTED NOT DETECTED Final  Culture, blood (Routine X 2) w Reflex to ID Panel     Status: Abnormal   Collection Time: 12/21/16  6:38 PM  Result Value Ref Range Status   Specimen Description BLOOD RIGHT ARM  Final   Special Requests IN PEDIATRIC BOTTLE Blood Culture adequate  volume  Final   Culture  Setup Time   Final    GRAM POSITIVE COCCI IN CLUSTERS IN PEDIATRIC BOTTLE CRITICAL VALUE NOTED.  VALUE IS CONSISTENT WITH PREVIOUSLY REPORTED AND CALLED VALUE.     Culture STAPHYLOCOCCUS EPIDERMIDIS (A)  Final   Report Status 12/25/2016 FINAL  Final   Organism ID, Bacteria STAPHYLOCOCCUS EPIDERMIDIS  Final      Susceptibility   Staphylococcus epidermidis - MIC*    CIPROFLOXACIN >=8 RESISTANT Resistant     ERYTHROMYCIN >=8 RESISTANT Resistant     GENTAMICIN 8 INTERMEDIATE Intermediate     OXACILLIN RESISTANT Resistant     TETRACYCLINE 2 SENSITIVE Sensitive     VANCOMYCIN 4 SENSITIVE Sensitive     TRIMETH/SULFA 160 RESISTANT Resistant     CLINDAMYCIN >=8 RESISTANT Resistant     RIFAMPIN <=0.5 SENSITIVE Sensitive     Inducible Clindamycin NEGATIVE Sensitive     * STAPHYLOCOCCUS EPIDERMIDIS  Surgical pcr screen     Status: None   Collection Time: 12/21/16  9:33 PM  Result Value Ref Range Status   MRSA, PCR NEGATIVE NEGATIVE Final   Staphylococcus aureus NEGATIVE NEGATIVE Final    Comment: (NOTE) The Xpert SA Assay (FDA approved for NASAL specimens in patients 26 years of age and older), is one component of a comprehensive surveillance program. It is not intended to diagnose infection nor to guide or monitor treatment.   Body fluid culture     Status: None (Preliminary result)   Collection Time: 12/24/16 12:13 PM  Result Value Ref Range Status   Specimen Description FLUID  Final   Special Requests ASCITIES  Final   Gram Stain   Final    RARE WBC PRESENT,BOTH PMN AND MONONUCLEAR NO ORGANISMS SEEN    Culture PENDING  Incomplete   Report Status PENDING  Incomplete    Studies/Results: Ct Image Guided Drainage By Percutaneous Catheter  Result Date: 12/24/2016 INDICATION: 71 year old male with postoperative intra-abdominal fluid collection and spontaneous drainage from his midline wound. EXAM: CT GUIDED DRAINAGE OF  ABSCESS MEDICATIONS: The patient is currently admitted to the hospital and receiving intravenous antibiotics. The antibiotics were administered within an appropriate time frame prior to the initiation of the procedure.  ANESTHESIA/SEDATION: 1 mg IV Versed 50 mcg IV Fentanyl Moderate Sedation Time:  15 minutes The patient was continuously monitored during the procedure by the interventional radiology nurse under my direct supervision. COMPLICATIONS: None immediate. TECHNIQUE: Informed written consent was obtained from the patient after a thorough discussion of the procedural risks, benefits and alternatives. All questions were addressed. A timeout was performed prior to the initiation of the procedure. PROCEDURE: A planning axial CT scan was performed. The intra-abdominal fluid collection is significantly decreased in size compared to 12/21/2016 likely due to continued drainage through the midline abdominal wound. There is still sufficient fluid to warrant drain placement. Therefore, the overlying skin was marked and sterilely prepped and draped in standard fashion using chlorhexidine skin prep. Local anesthesia was attained by infiltration with 1% lidocaine. Under intermittent CT guidance, a 18 gauge trocar needle was advanced into the fluid collection. A 0.035 wire was then advanced the needle and coiled within the fluid collection. The skin tract was dilated to 10 [place] and a Cook 10.2 [place] all-purpose drainage catheter was advanced over the wire and positioned in the fluid collection. Aspiration yields approximately 10 mL of serosanguineous fluid. No purulence or foul odor. The catheter was secured to the skin with 0 Prolene suture and connected to  JP bulb suction. FINDINGS: Serosanguineous fluid. IMPRESSION: Successful placement of a 10 French drainage catheter. Aspiration yields serosanguineous fluid. A sample was sent for Gram stain and culture. PLAN: Maintain tube to JP bulb suction and flush every shift. Recommend repeat CT scan of the abdomen and pelvis prior to drain removal. Signed, Criselda Peaches, MD [facility] Electronically Signed   By: Jacqulynn Cadet M.D.   On: 12/24/2016 17:31     Assessment/Plan: MRSE bacteremia MIC elevated (4) Will change to dapto.  Check CK Central line out yesterday Check TTE Repeat BCx sent 9-7- when negative 3 days would consider replacing central line.   Perforated gastric ulcer Intra-abdominal abscess Await Cx  ICD Apparently fired last pm per family.  CV to f/u?  Total days of antibiotics:  vanco 9-5 Zosyn 9-3         Bobby Rumpf Infectious Diseases (pager) [phone] [email or web address] 12/25/2016, 9:38 AM  LOS: 28 days

## 2016-12-25 NOTE — Progress Notes (Signed)
ANTICOAGULATION CONSULT NOTE - Initial Consult  Pharmacy Consult for Lovenox Indication: atrial fibrillation  No Known Allergies  Patient Measurements: Height: 6\' 1"  (185.4 cm) Weight: 189 lb 9.5 oz (86 kg) IBW/kg (Calculated) : 79.9  Assessment: 71 yo M with hx of Afib. On Xarelto PTA and then transitioned to heparin. On 8/26 was switched back to Xarelto but then had clinical worsening and is now on Lovenox. Patient is NPO and was started on TPN but currently being held. Hgb down to 7.3, plts wnl.  Goal of Therapy:  Monitor platelets by anticoagulation protocol: Yes   Plan:  Continue Lovenox 85mg  [place] Q12h Monitor CBC, s/s of bleed  Enzo Bi, PharmD, [facility] Clinical Pharmacist Pager [phone] 12/25/2016 10:44 AM

## 2016-12-26 LAB — BASIC METABOLIC PANEL
ANION GAP: 11 (ref 5–15)
BUN: 30 mg/dL — ABNORMAL HIGH (ref 6–20)
CHLORIDE: 117 mmol/L — AB (ref 101–111)
CO2: 20 mmol/L — ABNORMAL LOW (ref 22–32)
CREATININE: 0.96 mg/dL (ref 0.61–1.24)
Calcium: 7.5 mg/dL — ABNORMAL LOW (ref 8.9–10.3)
GFR calc non Af Amer: >60 mL/min (ref >60)
Glucose, Bld: 121 mg/dL — ABNORMAL HIGH (ref 65–99)
Potassium: 3.6 mmol/L (ref 3.5–5.1)
SODIUM: 148 mmol/L — AB (ref 135–145)

## 2016-12-26 LAB — BODY FLUID CULTURE

## 2016-12-26 LAB — GLUCOSE, CAPILLARY
GLUCOSE-CAPILLARY: 102 mg/dL — AB (ref 65–99)
GLUCOSE-CAPILLARY: 108 mg/dL — AB (ref 65–99)
GLUCOSE-CAPILLARY: 104 mg/dL — AB (ref 65–99)
Glucose-Capillary: 102 mg/dL — ABNORMAL HIGH (ref 65–99)
Glucose-Capillary: 125 mg/dL — ABNORMAL HIGH (ref 65–99)

## 2016-12-26 LAB — CBC
HCT: 27.4 % — ABNORMAL LOW (ref 39.0–52.0)
Hemoglobin: 8.2 g/dL — ABNORMAL LOW (ref 13.0–17.0)
MCH: 32.4 pg (ref 26.0–34.0)
MCHC: 29.9 g/dL — ABNORMAL LOW (ref 30.0–36.0)
MCV: 108.3 fL — AB (ref 78.0–100.0)
Platelets: 284 10*3/uL (ref 150–400)
RBC: 2.53 MIL/uL — ABNORMAL LOW (ref 4.22–5.81)
RDW: 15.3 % (ref 11.5–15.5)
WBC: 6.3 10*3/uL (ref 4.0–10.5)

## 2016-12-26 MED ORDER — FUROSEMIDE 10 MG/ML IJ SOLN
20.0000 mg | Freq: Once | INTRAMUSCULAR | Status: AC
  Administered 2016-12-26: 20 mg via INTRAVENOUS
  Filled 2016-12-26: qty 2

## 2016-12-26 MED ORDER — MIDODRINE HCL 5 MG PO TABS
2.5000 mg | ORAL_TABLET | Freq: Three times a day (TID) | ORAL | Status: DC
  Administered 2016-12-26 – 2017-01-13 (×51): 2.5 mg via ORAL
  Filled 2016-12-26 (×37): qty 1
  Filled 2016-12-26: qty 0.5
  Filled 2016-12-26 (×13): qty 1

## 2016-12-26 NOTE — Progress Notes (Signed)
PROGRESS NOTE    Jacob Pittman  XMD:[id number] DOB: [birth year] DOA: 11/26/2016 PCP: [name], MD   Brief Narrative: Jacob Pittman is a 71 y.o. 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 Was found to have a perforated pyloric channel ulcer and pneumoperitonitis s/p omental patch repair subsequently on epinephrine and Levophed.  8/12-  TTE LV moderately dilated with EF 15% &diffuse hypokinesis. There is akinesis of the inferolateral and inferior myocardium.  E coli UTI noted 8/13-  right brachial A- line placed and then developed ischemic right arm- vascular consult and transfer from [facility] to [facility]- thrombectomy of brachial artery- heparin Paroxysmal A-fib also noted 8/14- Decreased attenuation involving the right dentate nucleus in the right cerebellum and immediately adjacent cerebellar parenchyma, concerning for recent and potentially acute infarct in this area.  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- anticoagulation recommended to be stopped for 10-14 days - H pyloli + started on triple therapy - Transaminitis thought to be du to Diflucan hepatotoxicity 8/17 - febrile again and vasopressors resumed 8/23- extubated 8/25 A-fib with HR up to 160s, fever 101.6 at 4 AM- care transferred to [facility] 9/3 - stool noted to be coming out of wound   Assessment & Plan:   Principal Problem:   Perforated duodenal bulb ulcer (HCC) Active Problems:   Acute on chronic combined systolic and diastolic CHF (congestive heart failure) (HCC)   Nonischemic cardiomyopathy (HCC)   Pneumoperitoneum   Acute respiratory failure with hypoxia (HCC)   Paroxysmal A-fib (HCC)   CVA (cerebral vascular accident) (HCC)   Arterial thrombosis (HCC)   H pylori ulcer   Fatty infiltration of liver   DM (diabetes mellitus), type 2 (HCC)   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 Pylori ulcer Septic shock Abdominal Abscess Patient has had a prolonged hospital course secondary to above problems. Patient has received multiple antibiotic therapies. He now has evidence of a abscess tracking along sigmoid colon mesentery/omentum with drainage through the wound, evidence of wound necrosis. -General surgery recommendations -Palliative care recommendations: Continue medical treatment for now -Interventional radiology recommendations -start midodrine  Methicillin resistant staph species bacteremia Seen on 9/3 blood cultures x2. IJ removed 12/24/16 -infectious disease recommendations: daptomycin -repeat blood cultures obtained 12/25/16 and are pending (no growth x1 day)  Tachypnea Pulmonary edema on imaging. Improved with lasix. Stable.  Acute on chronic systolic and diastolic heart failure Repeat echo significant for improved EF of 25-30% from 15% with grade 2 diastolic dysfunction. Weight is up -strict in/out -daily weights -lasix x1  Paroxysmal atrial fibrillation -continue metoprolol  Bilateral basilar atelectasis -continue to encourage incentive spirometry  Hypernatremia Improved slightly  Acute respiratory failure with hypoxia Previously intubated and successfully extubated. Secondary to sepsis.  CVA Likely embolic from atrial fibrillation -continue Xarelto -continue aspirin  Arterial thrombosis Right brachial artery. Secondary to A-line. S/p thrombectomy  Bilateral arm edema No DVT on ultrasound -Lasix as above  Fatty liver  Diabetes mellitus, type 2 A1c of 5.8% -continue SSI  Pacer firing Read as asystole which does not appear to be the case. No arrhythmia noted. -cardiology for device interrogation.   DVT prophylaxis: Xarelto Code Status: DNR Family Communication: None at bedside Disposition Plan: Discharge  pending goals of care   Consultants:  General surgery  Interventional radiology  Palliative care medicine  Procedures:   8/10- ex lap- patch repair of pyloric ucler  8/13- brachial embolectomy 2 D ECHO Left ventricle: The cavity size was moderately dilated. Wallthickness was normal. Systolic function was severely reduced. Theestimated ejection fraction was 15%. Diffuse hypokinesis. Thereis akinesis of the inferolateral and inferior myocardium. Thestudy is not technically sufficient to allow evaluation of LVdiastolic function. - Aortic valve: There was mild regurgitation. - Mitral valve: There was moderate regurgitation. - Left atrium: The atrium was severely dilated. - Right ventricle: The cavity size was moderately dilated. Systolicfunction was moderately reduced. - Right atrium: The atrium was severely dilated. - Tricuspid valve: There was moderate regurgitation. - Pulmonary arteries: Systolic pressure was moderately increased.  Echocardiogram (12/25/16): EF of 27-06%, grade 2 diastolic dysfunction  Antimicrobials:  Amoxicillin  Unasyn  Clarithromycin  Metronidazole  Zosyn  Vancomycin  Daptomycin    Subjective: No concerns today  Objective: Vitals:   12/25/16 2345 12/26/16 0446 12/26/16 0700 12/26/16 1243  BP: (!) 99/57 (!) 93/50 (!) 104/34 (!) 117/40  Pulse: 70 71 70 77  Resp: (!) 31 (!) 22 (!) 38 (!) 37  Temp: 98.6 F (37 C) 97.7 F (36.5 C) 97.9 F (36.6 C) 98.3 F (36.8 C)  TempSrc: Oral Oral Oral Oral  SpO2: 100% 99%  94%  Weight:  89 kg (196 lb 3.4 oz)    Height:        Intake/Output Summary (Last 24 hours) at 12/26/16 1402 Last data filed at 12/26/16 1300  Gross per 24 hour  Intake             1705 ml  Output              755 ml  Net              950 ml   Filed Weights   12/24/16 0453 12/25/16 0500 12/26/16 0446  Weight: 87.7 kg (193 lb 5.5 oz) 86 kg (189 lb 9.5 oz) 89 kg (196 lb 3.4 oz)    Examination:  General exam:  Appears calm and comfortable Respiratory system: Clear to auscultation with decreased breath sounds bilaterally. Respiratory effort normal. Tachypnea. Cardiovascular system: S1 & S2 heard, RRR. 2/6 systolic murmur. Gastrointestinal system: Abdomen is nondistended, soft and nontender. Abdominal binder in place. Normal bowel sounds heard. Drain with serous drainage Central nervous system: Alert and oriented to self and to city Extremities: Bilateral upper extremity edema. No calf tenderness Skin: No cyanosis. No rashes Psychiatry: Judgement and insight appear impaired. Mood & affect depressed and flat.     Data Reviewed: I have personally reviewed following labs and imaging studies  CBC:  Recent Labs Lab 12/21/16 2103 12/22/16 0514 12/23/16 0409 12/24/16 0448 12/25/16 0221 12/26/16 0932  WBC 7.3 7.1 7.4 7.9 9.8 6.3  NEUTROABS 5.4  --   --   --  8.2*  --   HGB 8.4* 8.4* 8.6* 8.5* 7.3* 8.2*  HCT 27.3* 27.4* 28.1* 27.2* 23.5* 27.4*  MCV 105.8* 105.8* 106.4* 106.7* 105.9* 108.3*  PLT 247 246 291 304 356 237   Basic Metabolic Panel:  Recent Labs Lab 12/20/16 0540 12/21/16 0421  12/22/16 0514 12/23/16 0409 12/24/16 0956 12/25/16 0221 12/26/16 0932  NA 149* 147*  < > 147* 146* 146* 145 148*  K 3.9 3.7  < > 4.1 4.1 3.8 3.5 3.6  CL 118* 117*  < > 115* 113* 113* 112* 117*  CO2 22 22  < > 24  $'23 23 22 'x$ 20*  GLUCOSE 175* 195*  < > 107* 135* 91 90 121*  BUN 32* 36*  < > 36* 36* 50* 46* 30*  CREATININE 0.91 0.94  < > 1.02 1.31* 1.52* 1.35* 0.96  CALCIUM 7.5* 7.5*  < > 7.5* 7.4* 7.3* 7.4* 7.5*  MG 2.2 2.2  --   --   --   --  2.3  --   PHOS 2.3*  --   --   --  4.4  --  3.7  --   < > = values in this interval not displayed. GFR: Estimated Creatinine Clearance: 77.4 mL/min (by C-G formula based on SCr of 0.96 mg/dL). Liver Function Tests:  Recent Labs Lab 12/20/16 0540 12/21/16 0421 12/22/16 0514 12/23/16 0409 12/25/16 0221  AST 46* 201* 149* 161* 70*  ALT 48 96* 125* 142*  99*  ALKPHOS 64 155* 156* 143* 130*  BILITOT 0.6 0.8 0.7 0.9 0.9  PROT 6.0* 5.8* 6.0* 6.2* 6.2*  ALBUMIN 1.7* 1.6* 1.6* 1.7* 1.8*   No results for input(s): LIPASE, AMYLASE in the last 168 hours. No results for input(s): AMMONIA in the last 168 hours. Coagulation Profile:  Recent Labs Lab 12/23/16 0409  INR 1.28   Cardiac Enzymes:  Recent Labs Lab 12/25/16 1020  CKTOTAL 48*   BNP (last 3 results) No results for input(s): PROBNP in the last 8760 hours. HbA1C: No results for input(s): HGBA1C in the last 72 hours. CBG:  Recent Labs Lab 12/25/16 2051 12/25/16 2350 12/26/16 0312 12/26/16 0804 12/26/16 1242  GLUCAP 105* 102* 102* 125* 102*   Lipid Profile:  Recent Labs  12/25/16 0221  TRIG 189*   Thyroid Function Tests: No results for input(s): TSH, T4TOTAL, FREET4, T3FREE, THYROIDAB in the last 72 hours. Anemia Panel: No results for input(s): VITAMINB12, FOLATE, FERRITIN, TIBC, IRON, RETICCTPCT in the last 72 hours. Sepsis Labs:  Recent Labs Lab 12/21/16 2103  LATICACIDVEN 1.1    Recent Results (from the past 240 hour(s))  Culture, blood (Routine X 2) w Reflex to ID Panel     Status: Abnormal   Collection Time: 12/21/16  6:32 PM  Result Value Ref Range Status   Specimen Description BLOOD LEFT HAND  Final   Special Requests   Final    BOTTLES DRAWN AEROBIC ONLY Blood Culture adequate volume   Culture  Setup Time   Final    GRAM POSITIVE COCCI IN CLUSTERS AEROBIC BOTTLE ONLY CRITICAL RESULT CALLED TO, READ BACK BY AND VERIFIED WITH: K COOK PHARMD [id number] 12/22/16 A BROWNING    Culture (A)  Final    STAPHYLOCOCCUS EPIDERMIDIS SUSCEPTIBILITIES PERFORMED ON PREVIOUS CULTURE WITHIN THE LAST 5 DAYS.    Report Status 12/25/2016 FINAL  Final  Blood Culture ID Panel (Reflexed)     Status: Abnormal   Collection Time: 12/21/16  6:32 PM  Result Value Ref Range Status   Enterococcus species NOT DETECTED NOT DETECTED Final   Listeria monocytogenes NOT DETECTED NOT  DETECTED Final   Staphylococcus species DETECTED (A) NOT DETECTED Final    Comment: Methicillin (oxacillin) resistant coagulase negative staphylococcus. Possible blood culture contaminant (unless isolated from more than one blood culture draw or clinical case suggests pathogenicity). No antibiotic treatment is indicated for blood  culture contaminants. CRITICAL RESULT CALLED TO, READ BACK BY AND VERIFIED WITH: K COOK PHARMD 1505 12/22/16 A BROWNING    Staphylococcus aureus NOT DETECTED NOT DETECTED Final   Methicillin resistance DETECTED (A) NOT DETECTED Final  Comment: CRITICAL RESULT CALLED TO, READ BACK BY AND VERIFIED WITH: K COOK PHARMD 2304 12/22/16 A BROWNING    Streptococcus species NOT DETECTED NOT DETECTED Final   Streptococcus agalactiae NOT DETECTED NOT DETECTED Final   Streptococcus pneumoniae NOT DETECTED NOT DETECTED Final   Streptococcus pyogenes NOT DETECTED NOT DETECTED Final   Acinetobacter baumannii NOT DETECTED NOT DETECTED Final   Enterobacteriaceae species NOT DETECTED NOT DETECTED Final   Enterobacter cloacae complex NOT DETECTED NOT DETECTED Final   Escherichia coli NOT DETECTED NOT DETECTED Final   Klebsiella oxytoca NOT DETECTED NOT DETECTED Final   Klebsiella pneumoniae NOT DETECTED NOT DETECTED Final   Proteus species NOT DETECTED NOT DETECTED Final   Serratia marcescens NOT DETECTED NOT DETECTED Final   Haemophilus influenzae NOT DETECTED NOT DETECTED Final   Neisseria meningitidis NOT DETECTED NOT DETECTED Final   Pseudomonas aeruginosa NOT DETECTED NOT DETECTED Final   Candida albicans NOT DETECTED NOT DETECTED Final   Candida glabrata NOT DETECTED NOT DETECTED Final   Candida krusei NOT DETECTED NOT DETECTED Final   Candida parapsilosis NOT DETECTED NOT DETECTED Final   Candida tropicalis NOT DETECTED NOT DETECTED Final  Culture, blood (Routine X 2) w Reflex to ID Panel     Status: Abnormal   Collection Time: 12/21/16  6:38 PM  Result Value Ref Range  Status   Specimen Description BLOOD RIGHT ARM  Final   Special Requests IN PEDIATRIC BOTTLE Blood Culture adequate volume  Final   Culture  Setup Time   Final    GRAM POSITIVE COCCI IN CLUSTERS IN PEDIATRIC BOTTLE CRITICAL VALUE NOTED.  VALUE IS CONSISTENT WITH PREVIOUSLY REPORTED AND CALLED VALUE.    Culture STAPHYLOCOCCUS EPIDERMIDIS (A)  Final   Report Status 12/25/2016 FINAL  Final   Organism ID, Bacteria STAPHYLOCOCCUS EPIDERMIDIS  Final      Susceptibility   Staphylococcus epidermidis - MIC*    CIPROFLOXACIN >=8 RESISTANT Resistant     ERYTHROMYCIN >=8 RESISTANT Resistant     GENTAMICIN 8 INTERMEDIATE Intermediate     OXACILLIN RESISTANT Resistant     TETRACYCLINE 2 SENSITIVE Sensitive     VANCOMYCIN 4 SENSITIVE Sensitive     TRIMETH/SULFA 160 RESISTANT Resistant     CLINDAMYCIN >=8 RESISTANT Resistant     RIFAMPIN <=0.5 SENSITIVE Sensitive     Inducible Clindamycin NEGATIVE Sensitive     * STAPHYLOCOCCUS EPIDERMIDIS  Surgical pcr screen     Status: None   Collection Time: 12/21/16  9:33 PM  Result Value Ref Range Status   MRSA, PCR NEGATIVE NEGATIVE Final   Staphylococcus aureus NEGATIVE NEGATIVE Final    Comment: (NOTE) The Xpert SA Assay (FDA approved for NASAL specimens in patients 78 years of age and older), is one component of a comprehensive surveillance program. It is not intended to diagnose infection nor to guide or monitor treatment.   Body fluid culture     Status: Abnormal   Collection Time: 12/24/16 12:13 PM  Result Value Ref Range Status   Specimen Description FLUID  Final   Special Requests ASCITIES  Final   Gram Stain   Final    RARE WBC PRESENT,BOTH PMN AND MONONUCLEAR NO ORGANISMS SEEN    Culture (A)  Final    MULTIPLE ORGANISMS PRESENT, NONE PREDOMINANT CRITICAL RESULT CALLED TO, READ BACK BY AND VERIFIED WITH: A. DAVIS RN, AT 3154 12/25/16 BY D. VANHOOK REGARDING CULTURE GROWTH    Report Status 12/26/2016 FINAL  Final  Culture, blood (routine  x  2)     Status: None (Preliminary result)   Collection Time: 12/25/16  2:21 AM  Result Value Ref Range Status   Specimen Description BLOOD LEFT HAND  Final   Special Requests IN PEDIATRIC BOTTLE Blood Culture adequate volume  Final   Culture NO GROWTH 1 DAY  Final   Report Status PENDING  Incomplete  Culture, blood (routine x 2)     Status: None (Preliminary result)   Collection Time: 12/25/16  2:35 AM  Result Value Ref Range Status   Specimen Description BLOOD LEFT WRIST  Final   Special Requests IN PEDIATRIC BOTTLE Blood Culture adequate volume  Final   Culture NO GROWTH 1 DAY  Final   Report Status PENDING  Incomplete         Radiology Studies: No results found.      Scheduled Meds: . aspirin  81 mg Per Tube Daily  . chlorhexidine   Topical Once  . chlorhexidine gluconate (MEDLINE KIT)  15 mL Mouth Rinse BID  . enoxaparin (LOVENOX) injection  85 mg Subcutaneous Q12H  . feeding supplement (VITAL 1.5 CAL)  1,000 mL Per Tube Q24H  . insulin aspart  0-20 Units Subcutaneous Q4H  . lidocaine (PF)  5 mL Intradermal Once  . mouth rinse  15 mL Mouth Rinse QID  . pantoprazole (PROTONIX) IV  40 mg Intravenous Q12H  . sodium chloride flush  5 mL Intravenous Q8H  . sodium hypochlorite   Irrigation BID   Continuous Infusions: . sodium chloride Stopped (12/14/16 0953)  . sodium chloride 10 mL/hr at 12/23/16 2110  . DAPTOmycin (CUBICIN)  IV Stopped (12/25/16 1739)  . piperacillin-tazobactam (ZOSYN)  IV 3.375 g (12/26/16 1205)     LOS: 29 days     Cordelia Poche, MD [facility] 12/26/2016, 2:02 PM Pager: [phone]  If 7PM-7AM, please contact night-coverage [email or web address] Password [email or web address] 12/26/2016, 2:02 PM

## 2016-12-26 NOTE — Progress Notes (Signed)
26 Days Post-Op    CC:  Perforated ulcer  Subjective: No complaints.     Objective: Vital signs in last 24 hours: Temp:  [97 F (36.1 C)-98.6 F (37 C)] 97.9 F (36.6 C) (09/08 0700) Pulse Rate:  [69-78] 70 (09/08 0700) Resp:  [22-38] 38 (09/08 0700) BP: (93-115)/(31-76) 104/34 (09/08 0700) SpO2:  [99 %-100 %] 99 % (09/08 0446) Weight:  [89 kg (196 lb 3.4 oz)] 89 kg (196 lb 3.4 oz) (09/08 0446) Last BM Date: 12/24/16  Intake/Output from previous day: 09/07 0701 - 09/08 0700 In: 1700 [I.V.:1005; NG/GT:290; IV Piggyback:400] Out: 705 [Urine:700; Drains:5] Intake/Output this shift: No intake/output data recorded.  General appearance: alert, cooperative, no distress and talking and responding more.   Resp: Clear anterior exam GI: soft, drain is clear serous fluid and he is not having pain.    Lab Results:   Recent Labs  12/24/16 0448 12/25/16 0221  WBC 7.9 9.8  HGB 8.5* 7.3*  HCT 27.2* 23.5*  PLT 304 356    BMET  Recent Labs  12/24/16 0956 12/25/16 0221  NA 146* 145  K 3.8 3.5  CL 113* 112*  CO2 23 22  GLUCOSE 91 90  BUN 50* 46*  CREATININE 1.52* 1.35*  CALCIUM 7.3* 7.4*   PT/INR No results for input(s): LABPROT, INR in the last 72 hours.   Recent Labs Lab 12/20/16 0540 12/21/16 0421 12/22/16 0514 12/23/16 0409 12/25/16 0221  AST 46* 201* 149* 161* 70*  ALT 48 96* 125* 142* 99*  ALKPHOS 64 155* 156* 143* 130*  BILITOT 0.6 0.8 0.7 0.9 0.9  PROT 6.0* 5.8* 6.0* 6.2* 6.2*  ALBUMIN 1.7* 1.6* 1.6* 1.7* 1.8*     Lipase     Component Value Date/Time   LIPASE 20 12/01/2016 0304     Medications: . aspirin  81 mg Per Tube Daily  . chlorhexidine   Topical Once  . chlorhexidine gluconate (MEDLINE KIT)  15 mL Mouth Rinse BID  . enoxaparin (LOVENOX) injection  85 mg Subcutaneous Q12H  . feeding supplement (VITAL 1.5 CAL)  1,000 mL Per Tube Q24H  . insulin aspart  0-20 Units Subcutaneous Q4H  . lidocaine (PF)  5 mL Intradermal Once  . mouth  rinse  15 mL Mouth Rinse QID  . pantoprazole (PROTONIX) IV  40 mg Intravenous Q12H  . sodium chloride flush  5 mL Intravenous Q8H  . sodium hypochlorite   Irrigation BID   . sodium chloride Stopped (12/14/16 0953)  . sodium chloride 10 mL/hr at 12/23/16 2110  . DAPTOmycin (CUBICIN)  IV Stopped (12/25/16 1739)  . dextrose 5 % and 0.9% NaCl 50 mL/hr at 12/24/16 1758  . piperacillin-tazobactam (ZOSYN)  IV Stopped (12/26/16 0704)   Anti-infectives    Start     Dose/Rate Route Frequency Ordered Stop   12/25/16 1600  DAPTOmycin (CUBICIN) 500 mg in sodium chloride 0.9 % IVPB     500 mg 220 mL/hr over 30 Minutes Intravenous Every 24 hours 12/25/16 1002     12/25/16 1300  DAPTOmycin (CUBICIN) 500 mg in sodium chloride 0.9 % IVPB  Status:  Discontinued     500 mg 220 mL/hr over 30 Minutes Intravenous Every 24 hours 12/25/16 0956 12/25/16 1002   12/23/16 1800  vancomycin (VANCOCIN) IVPB 750 mg/150 ml premix  Status:  Discontinued     750 mg 150 mL/hr over 60 Minutes Intravenous Every 12 hours 12/23/16 0310 12/25/16 0941   12/23/16 0145  vancomycin (VANCOCIN) 1,750 mg  in sodium chloride 0.9 % 500 mL IVPB     1,750 mg 250 mL/hr over 120 Minutes Intravenous  Once 12/23/16 0138 12/23/16 0353   12/21/16 1500  piperacillin-tazobactam (ZOSYN) IVPB 3.375 g     3.375 g 12.5 mL/hr over 240 Minutes Intravenous Every 8 hours 12/21/16 1456     12/13/16 1000  amoxicillin (AMOXIL) 250 MG/5ML suspension 1,000 mg     1,000 mg Per Tube Every 12 hours 12/13/16 0935 12/16/16 1833   12/12/16 1900  piperacillin-tazobactam (ZOSYN) IVPB 3.375 g  Status:  Discontinued     3.375 g 12.5 mL/hr over 240 Minutes Intravenous Every 8 hours 12/12/16 1759 12/13/16 0911   12/12/16 1400  Ampicillin-Sulbactam (UNASYN) 3 g in sodium chloride 0.9 % 100 mL IVPB  Status:  Discontinued     3 g 200 mL/hr over 30 Minutes Intravenous Every 6 hours 12/12/16 1215 12/12/16 1753   12/10/16 0600  amoxicillin (AMOXIL) 250 MG/5ML suspension  1,000 mg  Status:  Discontinued     1,000 mg Per Tube Every 12 hours 12/09/16 1701 12/12/16 1206   12/09/16 1130  amoxicillin (AMOXIL) 250 MG/5ML suspension 1,000 mg  Status:  Discontinued     1,000 mg Oral Every 12 hours 12/09/16 1117 12/09/16 1118   12/09/16 1130  amoxicillin (AMOXIL) 250 MG/5ML suspension 1,000 mg  Status:  Discontinued     1,000 mg Per Tube Every 12 hours 12/09/16 1118 12/09/16 1701   12/04/16 2200  clarithromycin (BIAXIN) tablet 500 mg     500 mg Per Tube Every 12 hours 12/04/16 1107 12/16/16 2158   12/04/16 2000  piperacillin-tazobactam (ZOSYN) IVPB 3.375 g     3.375 g 12.5 mL/hr over 240 Minutes Intravenous Every 8 hours 12/04/16 1339 12/08/16 2359   12/04/16 1600  metroNIDAZOLE (FLAGYL) tablet 500 mg  Status:  Discontinued     500 mg Per Tube Every 8 hours 12/04/16 1107 12/09/16 1117   12/02/16 1100  clarithromycin (BIAXIN) 250 MG/5ML suspension 500 mg  Status:  Discontinued     500 mg Per Tube Every 12 hours 12/02/16 1012 12/04/16 1107   12/02/16 1045  metroNIDAZOLE (FLAGYL) 50 mg/ml oral suspension 500 mg  Status:  Discontinued     500 mg Per Tube 3 times daily 12/02/16 1036 12/04/16 1107   12/02/16 1015  metroNIDAZOLE (FLAGYL) 50 mg/ml oral suspension 250 mg  Status:  Discontinued     250 mg Per Tube 4 times daily 12/02/16 1011 12/02/16 1036   11/28/16 0800  fluconazole (DIFLUCAN) IVPB 200 mg  Status:  Discontinued     200 mg 100 mL/hr over 60 Minutes Intravenous Every 24 hours 11/27/16 0624 12/02/16 1018   11/27/16 0630  fluconazole (DIFLUCAN) IVPB 400 mg     400 mg 100 mL/hr over 120 Minutes Intravenous  Once 11/27/16 0622 11/27/16 0841   11/27/16 0600  piperacillin-tazobactam (ZOSYN) IVPB 3.375 g  Status:  Discontinued     3.375 g 12.5 mL/hr over 240 Minutes Intravenous Every 8 hours 11/27/16 0534 12/04/16 1339   11/27/16 0515  piperacillin-tazobactam (ZOSYN) IVPB 3.375 g  Status:  Discontinued     3.375 g 100 mL/hr over 30 Minutes Intravenous  Once  11/27/16 0510 11/27/16 0518   11/27/16 0030  piperacillin-tazobactam (ZOSYN) IVPB 3.375 g     3.375 g 100 mL/hr over 30 Minutes Intravenous  Once 11/27/16 0016 11/27/16 0755   11/27/16 0030  vancomycin (VANCOCIN) IVPB 1000 mg/200 mL premix     1,000 mg 200  mL/hr over 60 Minutes Intravenous  Once 11/27/16 0016 11/27/16 0756     Assessment/Plan Perforated pyloric ulcer  S/P EXPLORATORY LAPAROTOMY, GRAHAM PATCH REPAIR8/10/18 Dr. 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 Oneida Alar ? CVA Malnutrition - prealbumin 8.2 Hypernatremia/hyperchloremia - Free water 200 ml q8h Dysphagia Type II diabetes Acute renal failure - resolving FEN: IV fluids/npo/ ID:  zosyn 8/9- 12/08/16, Diflucan 8/9-8/15/18, clarithromycin 8/17 - 8/29 Amoxicillin 8/23-29/18 =>>              Vancomycin restarted 9/5 =>>   Zosyn restarted 9/3 =>>  Daptomycin 12/25/16 =>> DVT: SCD/heparin =>>Xarelto per TF last dose last PM   Plan:  Continue slowly advancing tube feeds. Continue drain.       LOS: 29 days    Clovis Riley MD 12/26/2016

## 2016-12-27 ENCOUNTER — Inpatient Hospital Stay (HOSPITAL_COMMUNITY): Payer: Medicare PPO

## 2016-12-27 DIAGNOSIS — R740 Nonspecific elevation of levels of transaminase and lactic acid dehydrogenase [LDH]: Secondary | ICD-10-CM

## 2016-12-27 LAB — GLUCOSE, CAPILLARY
GLUCOSE-CAPILLARY: 109 mg/dL — AB (ref 65–99)
GLUCOSE-CAPILLARY: 117 mg/dL — AB (ref 65–99)
GLUCOSE-CAPILLARY: 120 mg/dL — AB (ref 65–99)
GLUCOSE-CAPILLARY: 108 mg/dL — AB (ref 65–99)
Glucose-Capillary: 100 mg/dL — ABNORMAL HIGH (ref 65–99)
Glucose-Capillary: 100 mg/dL — ABNORMAL HIGH (ref 65–99)
Glucose-Capillary: 108 mg/dL — ABNORMAL HIGH (ref 65–99)
Glucose-Capillary: 123 mg/dL — ABNORMAL HIGH (ref 65–99)

## 2016-12-27 MED ORDER — FUROSEMIDE 10 MG/ML IJ SOLN
20.0000 mg | Freq: Every day | INTRAMUSCULAR | Status: DC
  Administered 2016-12-27 – 2016-12-29 (×3): 20 mg via INTRAVENOUS
  Filled 2016-12-27 (×3): qty 2

## 2016-12-27 NOTE — Progress Notes (Signed)
PROGRESS NOTE    Jacob Pittman  YBO:[id number] DOB: 01-08-1946 DOA: 11/26/2016 PCP: Biagio Borg, MD   Brief Narrative: Jacob Pittman is a 71 y.o. 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 Was found to have a perforated pyloric channel ulcer and pneumoperitonitis s/p omental patch repair subsequently on epinephrine and Levophed.  8/12-  TTE LV moderately dilated with EF 15% &diffuse hypokinesis. There is akinesis of the inferolateral and inferior myocardium.  E coli UTI noted 8/13-  right brachial A- line placed and then developed ischemic right arm- vascular consult and transfer from [facility] to [facility]- thrombectomy of brachial artery- heparin Paroxysmal A-fib also noted 8/14- Decreased attenuation involving the right dentate nucleus in the right cerebellum and immediately adjacent cerebellar parenchyma, concerning for recent and potentially acute infarct in this area.  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- anticoagulation recommended to be stopped for 10-14 days - H pyloli + started on triple therapy - Transaminitis thought to be du to Diflucan hepatotoxicity 8/17 - febrile again and vasopressors resumed 8/23- extubated 8/25 A-fib with HR up to 160s, fever 101.6 at 4 AM- care transferred to [facility] 9/3 - stool noted to be coming out of wound   Assessment & Plan:   Principal Problem:   Perforated duodenal bulb ulcer ([place]) Active Problems:   Acute on chronic combined systolic and diastolic CHF (congestive heart failure) ([place])   Nonischemic cardiomyopathy (HCC)   Pneumoperitoneum   Acute respiratory failure with hypoxia (HCC)   Paroxysmal A-fib ([place])   CVA (cerebral vascular accident) ([place])   Arterial thrombosis ([place])   H pylori ulcer   Fatty infiltration of liver   DM (diabetes mellitus), type 2 (HCC)   Pressure sore on heel, left, unstageable ([place])   Poor venous  access   Palliative care encounter   Goals of care, counseling/discussion   Perforated duodenal bulb ulcer Peritonitis H. Pylori ulcer Septic shock Abdominal Abscess Patient has had a prolonged hospital course secondary to above problems. Patient has received multiple antibiotic therapies. He now has evidence of a abscess tracking along sigmoid colon mesentery/omentum with drainage through the wound, evidence of wound necrosis. -General surgery recommendations -Palliative care recommendations: Continue medical treatment for now -Interventional radiology recommendations -continue midodrine  Methicillin resistant staph species bacteremia Seen on 9/3 blood cultures x2. IJ removed 12/24/16 -infectious disease recommendations: daptomycin -repeat blood cultures obtained 12/25/16 and are pending (no growth x2 days)  Tachypnea Pulmonary edema on imaging. Improved with lasix. Stable.  Acute on chronic systolic and diastolic heart failure Repeat echo significant for improved EF of 25-30% from 15% with grade 2 diastolic dysfunction. Weight is down after lasix IV. -strict in/out -daily weights  Paroxysmal atrial fibrillation -continue metoprolol  Bilateral basilar atelectasis -continue to encourage incentive spirometry  Hypernatremia Improved slightly  Acute respiratory failure with hypoxia Previously intubated and successfully extubated. Secondary to sepsis.  CVA Likely embolic from atrial fibrillation -continue Xarelto -continue aspirin  Arterial thrombosis Right brachial artery. Secondary to A-line. S/p thrombectomy  Bilateral arm edema No DVT on ultrasound -Lasix as above  Fatty liver  Diabetes mellitus, type 2 A1c of 5.8% -continue SSI  Pacer firing Read as asystole which does not appear to be the case. No arrhythmia noted. -cardiology for device interrogation.   DVT prophylaxis: Xarelto Code Status: DNR Family Communication: None at bedside Disposition Plan:  Discharge pending goals of care  Consultants:   General surgery  Interventional radiology  Palliative care medicine  Procedures:   8/10- ex lap- patch repair of pyloric ucler  8/13- brachial embolectomy 2 D ECHO Left ventricle: The cavity size was moderately dilated. Wallthickness was normal. Systolic function was severely reduced. Theestimated ejection fraction was 15%. Diffuse hypokinesis. Thereis akinesis of the inferolateral and inferior myocardium. Thestudy is not technically sufficient to allow evaluation of LVdiastolic function. - Aortic valve: There was mild regurgitation. - Mitral valve: There was moderate regurgitation. - Left atrium: The atrium was severely dilated. - Right ventricle: The cavity size was moderately dilated. Systolicfunction was moderately reduced. - Right atrium: The atrium was severely dilated. - Tricuspid valve: There was moderate regurgitation. - Pulmonary arteries: Systolic pressure was moderately increased.  Echocardiogram (12/25/16): EF of 85-02%, grade 2 diastolic dysfunction  Antimicrobials:  Amoxicillin  Unasyn  Clarithromycin  Metronidazole  Zosyn  Vancomycin  Daptomycin    Subjective: No pain  Objective: Vitals:   12/27/16 0600 12/27/16 0700 12/27/16 0703 12/27/16 1224  BP: 100/82 (!) 93/59    Pulse: 76 85    Resp: (!) 38 (!) 26    Temp:   98.8 F (37.1 C) (!) 97.3 F (36.3 C)  TempSrc:   Oral Axillary  SpO2: (!) 84% 98%    Weight:      Height:        Intake/Output Summary (Last 24 hours) at 12/27/16 1227 Last data filed at 12/27/16 1226  Gross per 24 hour  Intake              745 ml  Output             1700 ml  Net             -955 ml   Filed Weights   12/25/16 0500 12/26/16 0446 12/27/16 0354  Weight: 86 kg (189 lb 9.5 oz) 89 kg (196 lb 3.4 oz) 84.7 kg (186 lb 11.7 oz)    Examination:  General exam: Appears calm and comfortable Respiratory system: Clear to auscultation with decreased  breath sounds bilaterally. Respiratory effort normal. Tachypnea. Cardiovascular system: S1 & S2 heard, RRR. 2/6 systolic murmur. Gastrointestinal system: Abdomen is nondistended, soft and nontender. Abdominal binder in place. Normal bowel sounds heard. Serous drainage Central nervous system: Alert and oriented to self and to city Extremities: Bilateral upper extremity edema. No calf tenderness Skin: No cyanosis. No rashes Psychiatry: Judgement and insight appear impaired. Mood & affect depressed and flat.     Data Reviewed: I have personally reviewed following labs and imaging studies  CBC:  Recent Labs Lab 12/21/16 2103 12/22/16 0514 12/23/16 0409 12/24/16 0448 12/25/16 0221 12/26/16 0932  WBC 7.3 7.1 7.4 7.9 9.8 6.3  NEUTROABS 5.4  --   --   --  8.2*  --   HGB 8.4* 8.4* 8.6* 8.5* 7.3* 8.2*  HCT 27.3* 27.4* 28.1* 27.2* 23.5* 27.4*  MCV 105.8* 105.8* 106.4* 106.7* 105.9* 108.3*  PLT 247 246 291 304 356 774   Basic Metabolic Panel:  Recent Labs Lab 12/21/16 0421  12/22/16 0514 12/23/16 0409 12/24/16 0956 12/25/16 0221 12/26/16 0932  NA 147*  < > 147* 146* 146* 145 148*  K 3.7  < > 4.1 4.1 3.8 3.5 3.6  CL 117*  < > 115* 113* 113* 112* 117*  CO2 22  < > _0 20*  GLUCOSE 195*  < > 107* 135* 91 90 121*  BUN 36*  < >  36* 36* 50* 46* 30*  CREATININE 0.94  < > 1.02 1.31* 1.52* 1.35* 0.96  CALCIUM 7.5*  < > 7.5* 7.4* 7.3* 7.4* 7.5*  MG 2.2  --   --   --   --  2.3  --   PHOS  --   --   --  4.4  --  3.7  --   < > = values in this interval not displayed. GFR: Estimated Creatinine Clearance: 77.4 mL/min (by C-G formula based on SCr of 0.96 mg/dL). Liver Function Tests:  Recent Labs Lab 12/21/16 0421 12/22/16 0514 12/23/16 0409 12/25/16 0221  AST 201* 149* 161* 70*  ALT 96* 125* 142* 99*  ALKPHOS 155* 156* 143* 130*  BILITOT 0.8 0.7 0.9 0.9  PROT 5.8* 6.0* 6.2* 6.2*  ALBUMIN 1.6* 1.6* 1.7* 1.8*   No results for input(s): LIPASE, AMYLASE in the last 168  hours. No results for input(s): AMMONIA in the last 168 hours. Coagulation Profile:  Recent Labs Lab 12/23/16 0409  INR 1.28   Cardiac Enzymes:  Recent Labs Lab 12/25/16 1020  CKTOTAL 48*   BNP (last 3 results) No results for input(s): PROBNP in the last 8760 hours. HbA1C: No results for input(s): HGBA1C in the last 72 hours. CBG:  Recent Labs Lab 12/26/16 1933 12/27/16 0005 12/27/16 0357 12/27/16 0714 12/27/16 0831  GLUCAP 104* 100* 109* 123* 117*   Lipid Profile:  Recent Labs  12/25/16 0221  TRIG 189*   Thyroid Function Tests: No results for input(s): TSH, T4TOTAL, FREET4, T3FREE, THYROIDAB in the last 72 hours. Anemia Panel: No results for input(s): VITAMINB12, FOLATE, FERRITIN, TIBC, IRON, RETICCTPCT in the last 72 hours. Sepsis Labs:  Recent Labs Lab 12/21/16 2103  LATICACIDVEN 1.1    Recent Results (from the past 240 hour(s))  Culture, blood (Routine X 2) w Reflex to ID Panel     Status: Abnormal   Collection Time: 12/21/16  6:32 PM  Result Value Ref Range Status   Specimen Description BLOOD LEFT HAND  Final   Special Requests   Final    BOTTLES DRAWN AEROBIC ONLY Blood Culture adequate volume   Culture  Setup Time   Final    GRAM POSITIVE COCCI IN CLUSTERS AEROBIC BOTTLE ONLY CRITICAL RESULT CALLED TO, READ BACK BY AND VERIFIED WITH: K COOK PHARMD [id number] 12/22/16 A BROWNING    Culture (A)  Final    STAPHYLOCOCCUS EPIDERMIDIS SUSCEPTIBILITIES PERFORMED ON PREVIOUS CULTURE WITHIN THE LAST 5 DAYS.    Report Status 12/25/2016 FINAL  Final  Blood Culture ID Panel (Reflexed)     Status: Abnormal   Collection Time: 12/21/16  6:32 PM  Result Value Ref Range Status   Enterococcus species NOT DETECTED NOT DETECTED Final   Listeria monocytogenes NOT DETECTED NOT DETECTED Final   Staphylococcus species DETECTED (A) NOT DETECTED Final    Comment: Methicillin (oxacillin) resistant coagulase negative staphylococcus. Possible blood culture contaminant  (unless isolated from more than one blood culture draw or clinical case suggests pathogenicity). No antibiotic treatment is indicated for blood  culture contaminants. CRITICAL RESULT CALLED TO, READ BACK BY AND VERIFIED WITH: K COOK PHARMD [id number] 12/22/16 A BROWNING    Staphylococcus aureus NOT DETECTED NOT DETECTED Final   Methicillin resistance DETECTED (A) NOT DETECTED Final    Comment: CRITICAL RESULT CALLED TO, READ BACK BY AND VERIFIED WITH: K COOK PHARMD [id number] 12/22/16 A BROWNING    Streptococcus species NOT DETECTED NOT DETECTED Final   Streptococcus agalactiae NOT DETECTED NOT  DETECTED Final   Streptococcus pneumoniae NOT DETECTED NOT DETECTED Final   Streptococcus pyogenes NOT DETECTED NOT DETECTED Final   Acinetobacter baumannii NOT DETECTED NOT DETECTED Final   Enterobacteriaceae species NOT DETECTED NOT DETECTED Final   Enterobacter cloacae complex NOT DETECTED NOT DETECTED Final   Escherichia coli NOT DETECTED NOT DETECTED Final   Klebsiella oxytoca NOT DETECTED NOT DETECTED Final   Klebsiella pneumoniae NOT DETECTED NOT DETECTED Final   Proteus species NOT DETECTED NOT DETECTED Final   Serratia marcescens NOT DETECTED NOT DETECTED Final   Haemophilus influenzae NOT DETECTED NOT DETECTED Final   Neisseria meningitidis NOT DETECTED NOT DETECTED Final   Pseudomonas aeruginosa NOT DETECTED NOT DETECTED Final   Candida albicans NOT DETECTED NOT DETECTED Final   Candida glabrata NOT DETECTED NOT DETECTED Final   Candida krusei NOT DETECTED NOT DETECTED Final   Candida parapsilosis NOT DETECTED NOT DETECTED Final   Candida tropicalis NOT DETECTED NOT DETECTED Final  Culture, blood (Routine X 2) w Reflex to ID Panel     Status: Abnormal   Collection Time: 12/21/16  6:38 PM  Result Value Ref Range Status   Specimen Description BLOOD RIGHT ARM  Final   Special Requests IN PEDIATRIC BOTTLE Blood Culture adequate volume  Final   Culture  Setup Time   Final    GRAM POSITIVE COCCI IN  CLUSTERS IN PEDIATRIC BOTTLE CRITICAL VALUE NOTED.  VALUE IS CONSISTENT WITH PREVIOUSLY REPORTED AND CALLED VALUE.    Culture STAPHYLOCOCCUS EPIDERMIDIS (A)  Final   Report Status 12/25/2016 FINAL  Final   Organism ID, Bacteria STAPHYLOCOCCUS EPIDERMIDIS  Final      Susceptibility   Staphylococcus epidermidis - MIC*    CIPROFLOXACIN >=8 RESISTANT Resistant     ERYTHROMYCIN >=8 RESISTANT Resistant     GENTAMICIN 8 INTERMEDIATE Intermediate     OXACILLIN RESISTANT Resistant     TETRACYCLINE 2 SENSITIVE Sensitive     VANCOMYCIN 4 SENSITIVE Sensitive     TRIMETH/SULFA 160 RESISTANT Resistant     CLINDAMYCIN >=8 RESISTANT Resistant     RIFAMPIN <=0.5 SENSITIVE Sensitive     Inducible Clindamycin NEGATIVE Sensitive     * STAPHYLOCOCCUS EPIDERMIDIS  Surgical pcr screen     Status: None   Collection Time: 12/21/16  9:33 PM  Result Value Ref Range Status   MRSA, PCR NEGATIVE NEGATIVE Final   Staphylococcus aureus NEGATIVE NEGATIVE Final    Comment: (NOTE) The Xpert SA Assay (FDA approved for NASAL specimens in patients 65 years of age and older), is one component of a comprehensive surveillance program. It is not intended to diagnose infection nor to guide or monitor treatment.   Body fluid culture     Status: Abnormal   Collection Time: 12/24/16 12:13 PM  Result Value Ref Range Status   Specimen Description FLUID  Final   Special Requests ASCITIES  Final   Gram Stain   Final    RARE WBC PRESENT,BOTH PMN AND MONONUCLEAR NO ORGANISMS SEEN    Culture (A)  Final    MULTIPLE ORGANISMS PRESENT, NONE PREDOMINANT CRITICAL RESULT CALLED TO, READ BACK BY AND VERIFIED WITH: A. DAVIS RN, AT [id number] 12/25/16 BY D. VANHOOK REGARDING CULTURE GROWTH    Report Status 12/26/2016 FINAL  Final  Culture, blood (routine x 2)     Status: None (Preliminary result)   Collection Time: 12/25/16  2:21 AM  Result Value Ref Range Status   Specimen Description BLOOD LEFT HAND  Final   Special Requests  IN  PEDIATRIC BOTTLE Blood Culture adequate volume  Final   Culture NO GROWTH 2 DAYS  Final   Report Status PENDING  Incomplete  Culture, blood (routine x 2)     Status: None (Preliminary result)   Collection Time: 12/25/16  2:35 AM  Result Value Ref Range Status   Specimen Description BLOOD LEFT WRIST  Final   Special Requests IN PEDIATRIC BOTTLE Blood Culture adequate volume  Final   Culture NO GROWTH 2 DAYS  Final   Report Status PENDING  Incomplete         Radiology Studies: No results found.      Scheduled Meds: . aspirin  81 mg Per Tube Daily  . chlorhexidine   Topical Once  . chlorhexidine gluconate (MEDLINE KIT)  15 mL Mouth Rinse BID  . enoxaparin (LOVENOX) injection  85 mg Subcutaneous Q12H  . feeding supplement (VITAL 1.5 CAL)  1,000 mL Per Tube Q24H  . insulin aspart  0-20 Units Subcutaneous Q4H  . lidocaine (PF)  5 mL Intradermal Once  . mouth rinse  15 mL Mouth Rinse QID  . midodrine  2.5 mg Oral TID WC  . pantoprazole (PROTONIX) IV  40 mg Intravenous Q12H  . sodium chloride flush  5 mL Intravenous Q8H   Continuous Infusions: . sodium chloride Stopped (12/14/16 0953)  . sodium chloride 10 mL/hr at 12/27/16 0040  . DAPTOmycin (CUBICIN)  IV Stopped (12/26/16 1650)  . piperacillin-tazobactam (ZOSYN)  IV 3.375 g (12/27/16 0946)     LOS: 30 days     Cordelia Poche, MD [facility] 12/27/2016, 12:27 PM Pager: [phone]  If 7PM-7AM, please contact night-coverage [email or web address] Password [email or web address] 12/27/2016, 12:27 PM

## 2016-12-27 NOTE — Progress Notes (Addendum)
MD notified: patient more lethargic today compared to yesterdays assessmThe First Baylor ScElwin SleiKentuckRUs Air Force Hospital 92Nd Medical Group147PSonONG:LKentuckyanNashua Ambulatory Surgical Center [id number] EJanJensMaryjeaJaCommunity Hospital EastGracielaThe First Physicians Surgery Center Elwin SleiKentuckRWestern Missouri Medical Center147Imperial SonONG:LKentuckyanSaint Joseph Mercy Livingston Hospita60847-806Holli13086 EJaJensMaryjeaJaParkwest Surgery Center LLCGraciela The First Memorial Hermann Pearland HElwin SleiKentuckRLegent Orthopedic + Spine147PagSonONG:LKentuckyanSt Lukes Hospital Monroe Campusi36563-678Holli13086 EJaJensMaryjeaJaHumboldt General HospitalGrThe First Promenades Surgery Center LLDocElwin SleiKentuckRHospital Buen Samaritano147PacSonONG:LKentuckyanMarshfield Medical Ctr Neillsvillei(80985-876Holli13086 EJaJensMaryjeaJaSurgicare LLCGracielThe First [place] Community Mental Elwin SleiKentuckRAlexandria Va Health Care System147CoSonONG:LKentuckyanSurgicare Surgical Associates Of Fairlawn LLCi(87930-772Holli13086 EJanJensMaryjeaJaSaint Joseph HospitalGraciela Husban409-Lucia GasMArlys JoRThe First Primary ChElwin SleiKentuckRPerimeter Surgical Center147Wheat SonONG:LKentuckyanLaser Surgery Ctri((84713-259Holli13086 EJaJensMaryjeaJaBrooks County HospitalGraciela Husban409-Lucia GaThe First North Central Surgical CenteBaptist Health MeElwin SleiKentuckRDelano Regional Medical Center14SonONG:LKentuckyanNortheastern Health Systemi80402-611Holli13086 EJaJensMaryjeaJaMonroe County Surgical Center LLCGracieThe First Los Robles HospitaElwin SleiKentuckREncompass Health Rehabilitation Of City View147YuSonONG:LKentuckyanDunes Surgical Hospitali(([phone])311Holli13086 EJensMaryjeaJaMetrowest Medical Center - Framingham CampusGraciela Husban4The First Christus St Vincent Regional Medical CElwin SleiKentuckRSpokane Digestive Disease Center Ps147HSonONG:LKentuckyanGalleria Surgery Center LLCi77281-418Holli13086 EJanJensMaryjeaJaMemorial Hospital And Health Care CenterGraciela Husban409-Lucia GasMAThe First ConneElwin SleiKentuckRCheyenne Surgical Center LLC147BSonONG:LKentuckyanNovant Health Haymarket Ambulatory Surgical Centeri97669-489Holli13086 EJanJensMaryjeaJaMemorial Hospital WestGraciela Husban409-Lucia GasMArlys JoTheElwin SleiKentuckRGab Endoscopy Center Ltd147Kings SonONG:LKentuckyanNavarro Regional Hospita56726-753Holli13086 EJensMaryjeaJaScl Health Community Hospital - NorthglennGraciela Husban409-Lucia GasMArlys JoRedge 952(WBon6BelgiuKaThe First Elwin SleiKentuckRSparrow Health System-St Lawrence Campus147RobinsonSonONG:LKentuckyanRiveredge Hospitali31(401) 460Holli13086 EJaJensMaryjeaJaGeisinger Encompass Health Rehabilitation HospitalGracThe First Va Roseburg Healthcare SystElwin SleiKentuckRCape And Islands Endoscopy Center LLC147BeSonONG:LKentuckyanGoleta Valley Cottage Hospita([phone] EJanJensMaryjeaJaTarboro Endoscopy Center Elwin SleiKentuckREmory University Hospital Midtown147EdwardSonONG:LKentuckyanHerndon Surgery Center Fresno Ca Multi Asci51657 343Holli13086 EJaJensMaryjeaJaHenrico Doctors' Hospital - ParhamGraciela Husban4The First Baton Rouge GenElwin SleiKentuckRWestern Pa Surgery Center Wexford Branch LLC147PreSonONG:LKentuckyanDignity Health Az General Hospital Mesa, LLCi22667-378Holli13086 EJensMaryjeaJaSpecialty Surgicare Of Las Vegas LPGraciela Husban409-Lucia GasMArlyThe Elwin SleiKentuckRSouthwood Psychiatric Hospital1SonONG:LKentuckyanMidwest Surgical Hospital LLCi(76618-749Holli13086 EJaJensMaryjeaJaEast Bay Surgery Center LLCGraciela Husban409-Lucia GasMArlys JoRedge 9ThElwin SleiKentuckRNorth Memorial Ambulatory Surgery Center At Maple Grove LLC147DanSonONG:LKentuckyanSe Texas Er And Hospitali(([phone] 599Holli13086 EJanJensMaryjeaJaAdventist Healthcare White Oak Medical CenterGraciela Husban409The First PolElwin SleiKentuckREncompass Health Rehabilitation Hospital Of Vineland147GiSonONG:LKentuckyanErlanger Medical Centeri([phone] EJaJensMaryjeaJaDoctors' Community HospitalGracielaThe First Brook Plaza Ambulatory Surgical CentePElwin SleiKentuckROrthopaedic Hsptl Of Wi147LewSonONG:LKentuckyanBahamas Surgery Centeri78251-106Holli13086 EJanJensMaryjeaJaSelect Specialty Hospital - South DallasGraciela HuThe First The CookevElwin SleiKentuckRChildrens Healthcare Of Atlanta At Scottish Rite147MontgSonONG:LKentuckyanBrooke Glen Behavioral Hospitali([phone] 012Holli13086 EJanJensMaryjeaJaVibra Hospital Of BoiseGraciela Husban409-Lucia GasMArlys JoRedge 952(WBonThe First Spaulding Rehabilitation HospElwin SleRGuthrie Cortland Regional Medical Center147RocONG:LanKentucky60403130JensMaryjeaJaLaurel Laser And Surgery Center Elwin SleiKentuckRSan Angelo Community Medical Center147New SonONG:LKentuckyanCommunity Memorial [phone])711Holli13086 EJaJensMaryjeaJaWoodland Memorial HospitalGraciela Husban409-Lucia GasMArlys JoRedge 9The First Fullerton KimballElwin SleiKentuckRBuena Vista Regional Medical Center147MorSonONG:LKentuckyanCass Lake Hospitali([phone] Charles The First Eagan OElwin SleiKentuckRHighland Community Hospital147VanSonONG:LKentuckyanCuba Memorial Hospitali27(805) 367Holli13086 EJanJensMaryjeaJaKnoxville Orthopaedic Surgery Center LLCGraciela Husban409The First Gso Equipment Corp Dba The Oregon Clinic EndElwin SleiKentuckRBacon County Hospital147WinSonONG:LKentuckyanPage Memorial Hospitali52057Holli13086 EJanJensMaryjeaJaHoly Redeemer Ambulatory Surgery Center LLCGraciela Husban409-Lucia GasMArlys JoRedge 9The First Asc Surgical Ventures LLC DElwin SleiKentuckRSt. Mary'S Regional Medical Center147Champion HeSonONG:LKentuckyanCompass Behavioral [phone] EJan(JensMaryjeaJaThe Scranton Pa Endoscopy Asc LPGraciela Husban409The First Alexandria Va HeElwin SleiKentuckROklahoma Outpatient Surgery Limited Partnership147VallSonONG:LKentuckyanAtlanta South Endoscopy Center LLCi(90725-486Holli13086 EJaJensMaryjeaJaSame Day Surgicare Of New England IncGraciela Husban409-Lucia GasMArlys JoRedge 952(WBon7BelgiuKathCLacretia St. Mary'S Healthcare -The First Spectrum Health United Memorial - UnitedElwin SleiKentuckRSt Vincent Salem Hospital Inc147ASonONG:LKentuckyanVibra Specialty [phone])418Holli13086 EJanJensMaryjeaJaCentral Louisiana State HospitalGracielThe First Fort LauElwin SleiKentuckRMagee Rehabilitation Hospital147LSonONG:LKentuckyanMethodist Hospitali22334-806Holli13086 EJanJensMaryjeaJaCgh Medical CenterGraciela Husban409-LucThe First Va Medical Center And AmbuElwin SleiKentuckRJackson South147Huber SonONG:LKentuckyanLiberty Eye Surgical Center LLCi(82865-312Holli130[address] Joseph Hospital WestGraciela Husban409-LuThe First Kindred HospitElwin SleiKentuckRSoutheast Regional Medical Center147Fairview HeSonONG:LKentuckyanCommunity Surgery Center Of Glendalei(([phone]) 415Holli13086 EJaJensMaryjeaJaAlexian Brothers Behavioral Health HospitalGraciela Husban409The First Spalding RehabElwin SleiKentuckRShoreline Surgery Center LLC147NSonONG:LKentuckyanSsm St. Joseph Health Center-Wentzvillei31([phone]) 013Holli130[address]JaAssencion St Vincent'S Medical Center SouthsideGraciela Husban409-The First Advanced Elwin SleiKentuckROrlando Health Dr P Phillips Hospital147Shady SonONG:LKentuckyanWest Haven Va Medical Centeri([phone] EJaJensMaryjeaJaVision Correction CenterGraciela Husban409-LThe First Midwest Eye Elwin SleiKentuckRSouthern Winds Hospital147JacSonONG:LKentuckyanNorthwest Orthopaedic Specialists Psi81903Holli13086 EJaJensMaryjeaJaStephens County HospitalGraciela Husban409-Lucia GasMArThe First Surgery Elwin SleiKentuckRGuilford Surgery Center147QuaSonONG:LKentuckyanOklahoma Heart Hospitali(([phone] EJaJensMaryjeaJaJay HospitalGraciela Husban409-Lucia GasMArlys JoThe First Buena VisElwin SleiKentuckRPeacehealth Cottage Grove Community Hospital147SonONG:LKentuckyanEdgerton Hospital And Health Servicesi([phone])616Holli13086 EJanJensMaryjeaJaLake Norman Regional Medical CenterGraciela Husban409-Lucia GasElwin SleiKentuckRRegions Behavioral Hospital147LyndonSonONG:LKentuckyanConey Island Hospitali34478-263Holli13086 EJanJensMaryjeaJaSouthwest Eye Surgery CenterGraciela Husban409-Lucia GasMArlys JoRedThe First Monterey Peninsula Surgery CentElwin SleiKentuckRKalispell Regional Medical Center Inc147Fort GSonONG:LKentuckyanNorth Kitsap Ambulatory Surgery Center In([phone] EJaJensMaryjeaJaMercy Hospital LebanonGraciela Husban409-LucThe FElwin SleiKentuckRThree Rivers Hospital147GranSonONG:LKentuckyanSt. Mary Medical Centeri([phone])208Holli13086 EJaJensMaryjeaJaO'Connor HospitalGraciela Husban409-Lucia GasMArlys JoRedge 952(WBon7BelgiuKathCLacretia Southwell Ambulatory Inc Dba SThe First Great River Elwin SleiKentuckRMountainview Medical Center147Desert SSonONG:LKentuckyanChoctaw Regional Medical Centeri(70403-694Holli13086 EJaJensMaryjeaJaThe New Mexico Behavioral Health Institute At Las VegasGraciela Husban409-TheElwin SleiKentuckRGenesis Medical Center-Dewitt147NSonONG:LKentuckyanDignity Health Chandler Regional Medical Centeri33551-297Holli13086 EJaJensMaryjeaJaOswego HospitalGraciela Husban409-LucThe First 96Th Medical Elwin SleiKentuckRHills & Dales General Hospital147OSonONG:LKentuckyanMeridian Plastic Surgery Centeri84647-479Holli13086 EJaJensMaryjeaJaBiltmore Surgical Partners LLCGraciela Husban409-Lucia GasMAThe First Endoscopic Ambulatory SpeciaElwin SleiKentuckRChambersburg Hospital147MSonONG:LKentuckyanGrand View Surgery Center At Haleysvillei(4([phone] EJanJensMaryjeaJaCarolinas Healthcare System Blue RidgeGraciela HuElwin SleiKentuckRTexas Health Harris Methodist Hospital Alliance147LandisSonONG:LKentuckyanSt. Elizabeth Granti86715-348Holli13086 EJanJensMaryjeaJaSpecialty Surgery Center LLCGraciela Husban4The First Guthrie Corning HosElwin SleiKentuckRSilicon Valley Surgery Center LP147ProSonONG:LKentuckyanBanner Casa Grande Medical Centeri42309-665Holli13086 EJan(JensMaryjeaJaStar Valley Medical CenterGracElwin SleiKentuckRSt. Alexius Hospital - Lanum Campus147SwSonONG:LKentuckyanUcsd Surgical Center Of San Diego LLCi(6836542Holli13086 EJan(JensMaryjeaJaSheridan County HospitalGraciela Husban409-Lucia GasMArlys TheElwin SleiKentuckRCarilion Stonewall Jackson Hospital147MSonONG:LKentuckyanMid America Rehabilitation Hospitali71([phone]) 605Holli13086 EJaJensMaryjeaJaFreeman Surgery Center Of Pittsburg LLCGraciela Husban409The First Black Hills Surgery Center LimitedElwin SleiKentuckRSpringfield Hospital147ForestSonONG:LKentuckyanSpecialty Surgery Center Of Connecticuti([phone] EJaJensMaryjeaJaCypress Pointe Surgical HospitalGraciela HusbThe First Deer Lodge MedicElwin SleiKentuckRGrays Harbor Community Hospital147WentSonONG:LKentuckyanReedsburg Area Med Ctri([phone])636Holli13086 EJaJensMaryjeaJaMercy Medical Center Mt. ShaThe First University Hospital Stoney Brook Southampton Elwin SleiKentuckRNorthern Wyoming Surgical Center147SearSonONG:LKentuckyanSaint ALPhonsus Regional Medical Centeri61818-459Holli13086 EJanJensMaryjeaJaAllen Memorial HospitalGraciela HusThe First CElwin SleiKentuckRCarolina Digestive Care147BuSonONG:LKentuckyanCedar County Memorial Hospita47606-818Holli13086 EJaJensMaryjeaJaBaylor Heart And Vascular CenterGraciela Husban409-Lucia GasMArlys JoRThe FirstElwin SleiKentuckRSt Joseph'S Children'S Home147LeSonONG:LKentuckyanOmega Surgery Center Lincolni(218310-275Holli13086 EJaJensMaryjeaJaDignity Health-St. Rose Dominican Sahara CampusGraciela Husban409-Lucia GasMArlys JoReThe First Providence Hood River Elwin SleiKentuckRFirelands Regional Medical Center147GSonONG:LKentuckyanEastern Regional Medical Centeri((73401-486Holli13086 EJaJensMaryjeaJaGastrointestinal Associates Endoscopy CenterGraciela Husban409-Lucia GasThe First Precision SurgElwin SleiKentuckREndocentre At Quarterfield Station147WiSonONG:LKentuckyanValdese General Hospital, Inc.[phone] EJaJensMaryjeaJaHeart Of America Surgery Center LLCGraciela Husban409-Lucia GasMArlys JThe First Southern Crescent Hospital For Specialty CarMartin Elwin SleiKentuckRMosaic Life Care At St. Joseph147North BenniSonONG:LKentuckyanRaritan Bay Medical Center - Perth Amboyi([phone] EJan(JensMaryjeaJaParkland Health Center-Bonne TerreGraciela Husban409-LuciaThe First CElwin SleiKentuckRChattanooga Endoscopy Center147MSonONG:LKentuckyanBrookdale Hospital Medical Centeri(57516-300Holli13086 EJaJensMaryjeaJaElliot Hospital City Of ManchesterGraciela HusThe First Presence Central And Suburban Hospitals Network Dba Presence Mercy Medical CenteElwin SleiKentuckRUniversity Of Cincinnati Medical Center, LLC147CalSonONG:LKentuckyanGreene County Hospitali21([phone]) 662Holli13086 EJaJensMaryjeaJaCommunity Hospital FairfaxGraciela HusbanThe First St Elwin SleiKentuckRMesa View Regional Hospital147El PSonONG:LKentuckyanRedington-Fairview General Hospitali(898847-185Holli13086 EJaJensMaryjeaJaStraith Hospital For Special SurgeryGraciela HusbThe First White Plains HospitElwin SleiKentuckRSentara Rmh Medical Center147PSonONG:LKentuckyanLb Surgery Center LLCi(73620 430Holli13086 EJan(JensMaryjeaJaRoane Medical CenterGraciela Husban409-LThe First Eastern Oregon RegElwin SleiKentuckRRegency Hospital Of Akron147JoneSonONG:LKentuckyanRiverview Psychiatric Centeri((838409-490Holli13086 EJaJensMaryjeaJaEncompass Health Rehabilitation Hospital Of MontgomeryGracielaElwin SleiKentuckRScottsdale Healthcare Shea147Mount PleSonONG:LKentuckyanCalifornia Pacific Medical Center - St. Luke'S Campusi([phone] EJanJensMaryjeaJaRockcastle Regional Hospital & Respiratory Care CenterGraciela Husban409-Lucia GaThe First Paragon Laser And Eye Surgery CenteRush Surgicenter At The Professional Building Ltd ParElwin SleiKentuckRNatural Eyes Laser And Surgery Center LlLP147HornSonONG:LKentuckyanHomestead Hospitali(217385-018Holli13086 EJaJensMaryjeaJaRolling Plains Memorial HospitalGraciela Husban409-Lucia GasMArlThe First Doctors Park Surgery InBaltimore AmElwin SleiKentuckRMitchell County Hospital Health Systems147ClevSonONG:LKentuckyanBacharach Institute For Rehabilitationi44605-865Holli13086 EJaJensMaryjeaJaBrook Plaza Ambulatory Surgical CenterGraciela HusbanThe First Fort Washakie ReElwin SleiKentuckRNorthwest Ohio Endoscopy Center147BridgeSonONG:LKentuckyanSouthern Ohio Eye Surgery Center LLCi7026030Holli13086 EJaJensMaryjeaJaClarksville Eye Surgery CenterGraciela Husban409-LThe First Unicoi County Memorial HospitaSurgery Center AtElwin SleiKentuckRLafayette-Amg Specialty Hospital147GSonONG:LKentuckyanShawnee Mission Surgery Center LLCi41281-474Holli13086 EJensMaryjeaJaPublic Health Serv Indian HospGraciela Husban409-Lucia GasThe First Medstar-Georgetown UniversityElwin SleiKentuckRRiverside General Hospital147SonONG:LKentuckyanShriners Hospital For Childreni(8071621Holli13086 EJensMaryjeaJaAstra Sunnyside Community HospitalGraciela Husban409-LuciaThe First Fort MElwin SleiKentuckRAdvanced Ambulatory Surgical Center Inc147Chino VSonONG:LKentuckyanTallahassee Outpatient Surgery Centeri219-351Holli13086 EJan(JensMaryjeaJaSurgery Center Of Enid IncGraciela HusbaThe Elwin SleiKentuckRMemorial Care Surgical Center At Orange Coast LLC147SutheSonONG:LKentuckyanMission Hospital Laguna [phone] EJanJensMaryjeaJaOphthalmic Outpatient Surgery Center Partners LLCGraciela The First Encompass Health Rehab HoElwin SleiKentuckREncompass Health Rehabilitation Hospital Of Charleston147FoSonONG:LKentuckyanField Memorial Community Hospitali43579-490Holli13086 EJanJensMaryjeaJaPiedmont Outpatient Surgery CenterGraciela Husban409-Lucia GasMArlys JoRedge 95The First Elwin SleiKentuckRThe Paviliion147LSonONG:LKentuckyanNorthern Plains Surgery Center [phone]) 089Holli13086 EJaJensMaryjeaJaPorter Medical Center, Inc.Graciela Husban409-LuThe First Our Lady Of Lourdes RegionaElwin SleiKentuckRCarolinas Healthcare System Kings Mountain147GiSonONG:LKentuckyanSelect Specialty Hospital - [phone])699Holli13086 EJaJensMaryjeaJaCity Of Hope Helford Clinical Research HospitalGraciela Husban409-Lucia GasMArlys JoRedge 952(WBon1BelgiuKathCLacretia Surgery [id number] First North Central Bronx Hospital8.1PhKentuckSsm Health St. Mary'S Hospital - Klutz CityyiSonShriners Hospitals For Children-PhiladeLPhiad703Hollice EJan(681)BoLacretia KentuThe First Pampa RegionalElwin SleiKentuckRTrigg County Hospital Inc.147CarSonONG:LKentuckyanBaptist Hospital For Womeni57437 058Holli13086 EJan(JensMaryjeaJaSelect Specialty Hospital - Palm BeachGThe First Aspirus Ironwood HospitaMercy Medical CenElwin SleRMetropolitan New Jersey LLC Dba Metropolitan Surgery Center147EONG:LanKentucky(27(305130JensMaryjeaJaSt. John'S Pleasant Valley HospitalGraciela Husban4Elwin SleiKentuckRCharleston Va Medical Center147North VSonONG:LKentuckyanLac+Usc Medical Centeri(95646-397Holli13086 EJanJensMaryjeaJaFisher County Hospital DistrictGraciela Husban4The First North Shore Endoscopy CenteWiElwin SleiKentuckRBroward Health Medical Center147East FaiSonONG:LKentuckyanSsm Health Surgerydigestive Health Ctr On Park Sti84951-594Holli13086 EJaJensMaryjeaJaGoodall-Witcher HospitalGraciela Husban409-Lucia GasMThe First Hhc Southington SurElwin SleiKentuckRUnity Surgical Center LLC147DollaSonONG:LKentuckyanCommunity Hospitali35765 182Holli13086 EJaJensMaryjeaJaSutter Fairfield Surgery CenterGraciela HusbElwin SleiKentuckRCarson Tahoe Regional Medical Center147City of thSonONG:LKentuckyanChrist Hospitali((703469 145Holli13086 EJaJensMaryjeaJaUhhs Richmond Heights HospitalGracielaThe FirstElwin SleiKentuckRCataract And Laser Center Inc147EllsSonONG:LKentuckyanCornerstone Ambulatory Surgery Center LLCi43([phone])765Holli13086 EJaJensMaryjeaJaMountain View Regional Medical CenterGraciela Husban40The First Va Medical CenterElwin SleiKentuckRUnited Medical Healthwest-New Orleans147PoplarSonONG:LKentuckyanPutnam General Hospitali92803-810Holli13086 EJaJensMaryjeaJaCommunity Medical Center IncGraciela HuElwin SleiKentuckRHutchinson Ambulatory Surgery Center LLC147RSonONG:LKentuckyanEastside Associates LLCi810-698Holli13086 EJan(JensMaryjeaJaHealthsouth/MaElwin SleiKentuckRJennersville Regional Hospital147VillaSonONG:LKentuckyanForrest General Hospitali(317-65Hollice EJanMaryjeaJacGThe FirElwin SleiKentuckRJane Todd Crawford Memorial Hospital147SonONG:LKentuckyanFayette Medical Centeri67(573)522Holli13086 EJaJensMaryjeaJaAcadia Medical Arts Ambulatory Surgical SuiteGraciela HusbaThe FiElwin SleiKentuckRAmerican Surgisite Centers147PerrysSonONG:LKentuckyanHosp Del Maestroi([phone])366Holli13086 EJanJensMaryjeaJaPhysicians Eye Surgery CenterGracielElwin SleiKentuckRArkansas Children'S Hospital147RiveSonONG:LKentuckyanUsmd Hospital At Arlingtoni(901-45Hollice EJanMaryjeaJacGraciela Husban409The FElwin SleiKentuckRExcela Health Latrobe Hospital147SonONG:LKentuckyanElmhurst Memorial Hospitali98830-201Holli13086 EJaJensMaryjeaJaLandmark Hospital Of Athens, LLCGracThe FirsElwin SleiKentuckROcean Behavioral Hospital Of Biloxi147RidSonONG:LKentuckyanEl Paso Behavioral Health Systemi(7024972Holli13086 EJaJensMaryjeaJaVa Middle Tennessee Healthcare SystemGracTElwin SleiKentuckRShriners Hospitals For Children1SonONG:LKentuckyanJefferson Surgery Center Cherry Hilli(346212-374Holli13086 EJaJensMaryjeaJaUniversity Of Illinois HospitalGraciela Husban409-The First TElwin SleiKentuckRRobert E. Bush Naval Hospital147Parcelas Viejas BoriSonONG:LKentuckyanRiverwalk Asc LLCi(90432-506Holli13086 EJaJensMaryjeaJaEastern New Mexico Medical CenterGraciela Husban409-Lucia GasMArlysThe First Sanford TransplantElwin SleiKentuckREncompass Health Rehab Hospital Of Huntington147DSonONG:LKentuckyanCommunity Hospital Of San Bernardinoi26586-194Holli13086 EJaJensMaryjeaJaMadison Memorial HospitalGraciela Husban409-Lucia GaThe First Surgery CenteElwin SleiKentuckRSt Catherine Hospital147SonONG:LKentuckyanNorthern Nj Endoscopy Center LLCi507 090Holli13086 EJanJensMaryjeaJaSt. Peter'S Addiction Recovery CenterGraciela Husban409-Lucia GasMArlThe First Great PlainElwin SleiKentuckRPalmer Lutheran Health Center147SonONG:LKentuckyanElite Surgical Servicesi([phone]) 641Holli13086 EJanJensMaryjeaJaBaylor Scott And White Surgicare Fort WorthGraciela HusThe First NElwin SleiKentuckRBillings Clinic147BeaveSonONG:LKentuckyanPlainview Hospitali(81561-598Holli13086 EJaJensMaryjeaJaVa Medical Center - ChillicotheGraciela Husban409-LucThe First Baptist Hospital Of MiamGarland SurgElwin SleiKentuckRCli Surgery Center147HaSonONG:LKentuckyanWalter Reed National Military Medical Centeri(213) 088Holli13086 EJaJensMaryjeaJaMariners HospitalGraciThe First Ocean County Eye Associates Good SaElwin SleiKentuckRGi Specialists LLC147ArkwSonONG:LKentuckyanOhio Specialty Surgical Suites LLCi732 522Holli13086 EJaJensMaryjeaJaVirtua West Jersey Hospital - CamdenGraciela HusThe FirstElwin SleiKentuckRCoshocton County Memorial Hospital147CenterSonONG:LKentuckyanArc Of Georgia LLCi21626-561Holli13086 EJaJensMaryjeaJaShriners Hospital For ChildrThe First Children'S Hospital Colorado At Memorial HospElwin SleiKentuckRMariners Hospital147StottSonONG:LKentuckyanToms River Surgery Centeri([phone] EJaJensMaryjeaJaLeesburg Rehabilitation HospitalGraciela HusbThe First Easton Ambulatory Services Associate Dba Northwood SurgeElwin SleiKentuckRGi Wellness Center Of Frederick LLC147FarnsSonONG:LKentuckyanNew Lifecare Hospital Of Mechanicsburgi26([phone])717Holli13086 EJan(JensMaryjeaJaRogers City Rehabilitation HospitalGraciThe First Healthcare Enterprises LLC DElwin SleiKentuckRSaint Joseph Hospital - South Campus147RSonONG:LKentuckyanSurgical Institute Of Michigani7131368Holli13086 EJaJensMaryjeaJaAdvanced Care Hospital Of Southern New MexicoGracielThe First Eye SElwin SleiKentuckRCataract And Laser Institute147TaSonONG:LKentuckyanEast Mountain Hospitali(840831-446Holli13086 EJanJensMaryjeaJaGlenbeighGraciela Husban40The First Auestetic Plastic Surgery Center LP Dba Museum District Ambulatory Surgery CenteTrinity Surgery Center LLC Dba Baycare SurElwin SleiKentuckRMs State Hospital147IngallsSonONG:LKentuckyanValley Regional Surgery Cente(5761413Holli13086 EJaJensMaryjeaJaMayo ClinicGraciela Husban409-Lucia GaThe FirsElwin SleiKentuckRBanner Goldfield Medical Center147CSonONG:LKentuckyanPanama City Surgery Centeri([phone] EJaJensMaryjeaJaMountain Home Surgery CenterGraciela Husban409-Lucia GasMArlys JoRedge 952(WBon2BelgiuKathCLacretia Conway [id number] NoPrKentuc40981TexasFarrelCascade Eye And SkiLouis MeckelroethtOf Branson LLGastroenterology Specialists IncMercy Hospital South36.1 G>nitor patient closely.

## 2016-12-27 NOTE — Progress Notes (Signed)
Referring Physician(s): Dr Lonny Prude  Supervising Physician: Jacqulynn Cadet  Patient Status:  [facility] - In-pt  Chief Complaint: Perforated pyloric ulcer Exploratory lap; graham patch repair- 11/27/2016 Fascial dehiscence Intra abdominal fluid collection   Subjective:  Drain placed 9/5 Output is minimal Wbc wnl afeb   Allergies: Patient has no known allergies.  Medications:  Current Facility-Administered Medications:  .  0.9 %  sodium chloride infusion, 250 mL, Intravenous, PRN, Germain Osgood, PA-C, Stopped at 12/14/16 [phone] .  0.9 %  sodium chloride infusion, , Intravenous, Continuous, Erick Colace, NP, Last Rate: 10 mL/hr at 12/27/16 0040 .  acetaminophen (TYLENOL) solution 650 mg, 650 mg, Per Tube, Q4H PRN, Mannam, Praveen, MD, 650 mg at 12/20/16 2323 .  artificial tears (LACRILUBE) ophthalmic ointment, , Both Eyes, Q3H PRN, Hammonds, Sharyn Blitz, MD .  aspirin chewable tablet 81 mg, 81 mg, Per Tube, Daily, Anders Simmonds, MD, 81 mg at 12/26/16 1127 .  chlorhexidine (HIBICLENS) 4 % liquid, , Topical, Once, Corey Harold, NP .  chlorhexidine gluconate (MEDLINE KIT) (PERIDEX) 0.12 % solution 15 mL, 15 mL, Mouth Rinse, BID, Mannam, Praveen, MD, 15 mL at 12/26/16 2012 .  DAPTOmycin (CUBICIN) 500 mg in sodium chloride 0.9 % IVPB, 500 mg, Intravenous, Q24H, Susa Raring, RPH, Stopped at 12/26/16 1650 .  enoxaparin (LOVENOX) injection 85 mg, 85 mg, Subcutaneous, Q12H, Mariel Aloe, MD, 85 mg at 12/27/16 0615 .  feeding supplement (VITAL 1.5 CAL) liquid 1,000 mL, 1,000 mL, Per Tube, Q24H, Earnstine Regal, PA-C, Last Rate: 20 mL/hr at 12/26/16 1621, 1,000 mL at 12/26/16 1621 .  fentaNYL (SUBLIMAZE) injection 25-50 mcg, 25-50 mcg, Intravenous, Q1H PRN, Dellinger, Marianne L, PA-C, 25 mcg at 12/23/16 1426 .  insulin aspart (novoLOG) injection 0-20 Units, 0-20 Units, Subcutaneous, Q4H, Alphonsa Overall, MD, 3 Units at 12/26/16 0809 .  lidocaine (PF) (XYLOCAINE) 1 %  injection 5 mL, 5 mL, Intradermal, Once, Johnson, Clanford L, MD .  MEDLINE mouth rinse, 15 mL, Mouth Rinse, QID, Mannam, Praveen, MD, 15 mL at 12/27/16 0409 .  midodrine (PROAMATINE) tablet 2.5 mg, 2.5 mg, Oral, TID WC, Mariel Aloe, MD, 2.5 mg at 12/26/16 1751 .  ondansetron (ZOFRAN-ODT) disintegrating tablet 4 mg, 4 mg, Oral, Q6H PRN **OR** ondansetron (ZOFRAN) injection 4 mg, 4 mg, Intravenous, Q6H PRN, Alphonsa Overall, MD .  pantoprazole (PROTONIX) injection 40 mg, 40 mg, Intravenous, Q12H, Mariel Aloe, MD, 40 mg at 12/26/16 2114 .  piperacillin-tazobactam (ZOSYN) IVPB 3.375 g, 3.375 g, Intravenous, Q8H, Georganna Skeans, MD, Stopped at 12/27/16 [phone] .  sodium chloride flush (NS) 0.9 % injection 5 mL, 5 mL, Intravenous, Q8H, Jacqulynn Cadet, MD, 5 mL at 12/26/16 2114 .  sodium hypochlorite (DAKIN'S 1/4 STRENGTH) topical solution, , Irrigation, BID, Stark Klein, MD    Vital Signs: BP (!) 93/59   Pulse 85   Temp 98.8 F (37.1 C) (Oral)   Resp (!) 26   Ht '6\' 1"'$  (1.854 m)   Wt 186 lb 11.7 oz (84.7 kg)   SpO2 98%   BMI 24.64 kg/m   Physical Exam  Abdominal: Soft.  Neurological: He is alert.  Skin: Skin is warm and dry.  Site of drain is clean and dry NT No bleeding Scant amt serous fluid in bulb  Nursing note and vitals reviewed.   Imaging: Ct Image Guided Drainage By Percutaneous Catheter  Result Date: 12/24/2016 INDICATION: 71 year old male with postoperative intra-abdominal fluid collection and spontaneous drainage from his midline wound.  EXAM: CT GUIDED DRAINAGE OF  ABSCESS MEDICATIONS: The patient is currently admitted to the hospital and receiving intravenous antibiotics. The antibiotics were administered within an appropriate time frame prior to the initiation of the procedure. ANESTHESIA/SEDATION: 1 mg IV Versed 50 mcg IV Fentanyl Moderate Sedation Time:  15 minutes The patient was continuously monitored during the procedure by the interventional radiology nurse  under my direct supervision. COMPLICATIONS: None immediate. TECHNIQUE: Informed written consent was obtained from the patient after a thorough discussion of the procedural risks, benefits and alternatives. All questions were addressed. A timeout was performed prior to the initiation of the procedure. PROCEDURE: A planning axial CT scan was performed. The intra-abdominal fluid collection is significantly decreased in size compared to 12/21/2016 likely due to continued drainage through the midline abdominal wound. There is still sufficient fluid to warrant drain placement. Therefore, the overlying skin was marked and sterilely prepped and draped in standard fashion using chlorhexidine skin prep. Local anesthesia was attained by infiltration with 1% lidocaine. Under intermittent CT guidance, a 18 gauge trocar needle was advanced into the fluid collection. A 0.035 wire was then advanced the needle and coiled within the fluid collection. The skin tract was dilated to 10 [place] and a Cook 10.2 [place] all-purpose drainage catheter was advanced over the wire and positioned in the fluid collection. Aspiration yields approximately 10 mL of serosanguineous fluid. No purulence or foul odor. The catheter was secured to the skin with 0 Prolene suture and connected to JP bulb suction. FINDINGS: Serosanguineous fluid. IMPRESSION: Successful placement of a 10 French drainage catheter. Aspiration yields serosanguineous fluid. A sample was sent for Gram stain and culture. PLAN: Maintain tube to JP bulb suction and flush every shift. Recommend repeat CT scan of the abdomen and pelvis prior to drain removal. Signed, Criselda Peaches, MD [facility] Radiology Electronically Signed   By: Jacqulynn Cadet M.D.   On: 12/24/2016 17:31    Labs:  CBC:  Recent Labs  12/23/16 0409 12/24/16 0448 12/25/16 0221 12/26/16 0932  WBC 7.4 7.9 9.8 6.3  HGB 8.6* 8.5* 7.3* 8.2*  HCT 28.1*  27.2* 23.5* 27.4*  PLT 291 304 356 284    COAGS:  Recent Labs  11/26/16 2315 11/30/16 1036 12/23/16 0409  INR 1.23 1.35 1.28  APTT  --  36 25    BMP:  Recent Labs  12/23/16 0409 12/24/16 0956 12/25/16 0221 12/26/16 0932  NA 146* 146* 145 148*  K 4.1 3.8 3.5 3.6  CL 113* 113* 112* 117*  CO2 '23 23 22 '$ 20*  GLUCOSE 135* 91 90 121*  BUN 36* 50* 46* 30*  CALCIUM 7.4* 7.3* 7.4* 7.5*  CREATININE 1.31* 1.52* 1.35* 0.96  GFRNONAA 52* 44* 50* >60  GFRAA >60 51* 59* >60    LIVER FUNCTION TESTS:  Recent Labs  12/21/16 0421 12/22/16 0514 12/23/16 0409 12/25/16 0221  BILITOT 0.8 0.7 0.9 0.9  AST 201* 149* 161* 70*  ALT 96* 125* 142* 99*  ALKPHOS 155* 156* 143* 130*  PROT 5.8* 6.0* 6.2* 6.2*  ALBUMIN 1.6* 1.6* 1.7* 1.8*    Assessment and Plan:  Intra abd fluid collection Drain intact Will follow   Electronically Signed: Ascencion Dike, PA-C 12/27/2016, 9:18 AM   I spent a total of 15 Minutes at the the patient's bedside AND on the patient's hospital floor or unit, greater than 50% of which was counseling/coordinating care for abscess drain

## 2016-12-28 DIAGNOSIS — A419 Sepsis, unspecified organism: Secondary | ICD-10-CM

## 2016-12-28 DIAGNOSIS — I5041 Acute combined systolic (congestive) and diastolic (congestive) heart failure: Secondary | ICD-10-CM

## 2016-12-28 DIAGNOSIS — I9589 Other hypotension: Secondary | ICD-10-CM

## 2016-12-28 DIAGNOSIS — K631 Perforation of intestine (nontraumatic): Secondary | ICD-10-CM

## 2016-12-28 LAB — CBC
HCT: 30.2 % — ABNORMAL LOW (ref 39.0–52.0)
HEMOGLOBIN: 9 g/dL — AB (ref 13.0–17.0)
MCH: 31.9 pg (ref 26.0–34.0)
MCHC: 29.8 g/dL — ABNORMAL LOW (ref 30.0–36.0)
MCV: 107.1 fL — ABNORMAL HIGH (ref 78.0–100.0)
PLATELETS: 343 10*3/uL (ref 150–400)
RBC: 2.82 MIL/uL — AB (ref 4.22–5.81)
RDW: 16.3 % — ABNORMAL HIGH (ref 11.5–15.5)
WBC: 6.9 10*3/uL (ref 4.0–10.5)

## 2016-12-28 LAB — COMPREHENSIVE METABOLIC PANEL
ALK PHOS: 92 U/L (ref 38–126)
ALT: 49 U/L (ref 17–63)
ANION GAP: 8 (ref 5–15)
AST: 39 U/L (ref 15–41)
Albumin: 2 g/dL — ABNORMAL LOW (ref 3.5–5.0)
BUN: 22 mg/dL — ABNORMAL HIGH (ref 6–20)
CALCIUM: 7.7 mg/dL — AB (ref 8.9–10.3)
CO2: 23 mmol/L (ref 22–32)
CREATININE: 0.82 mg/dL (ref 0.61–1.24)
Chloride: 116 mmol/L — ABNORMAL HIGH (ref 101–111)
Glucose, Bld: 110 mg/dL — ABNORMAL HIGH (ref 65–99)
Potassium: 3.1 mmol/L — ABNORMAL LOW (ref 3.5–5.1)
SODIUM: 147 mmol/L — AB (ref 135–145)
TOTAL PROTEIN: 5.7 g/dL — AB (ref 6.5–8.1)
Total Bilirubin: 0.6 mg/dL (ref 0.3–1.2)

## 2016-12-28 LAB — GLUCOSE, CAPILLARY
GLUCOSE-CAPILLARY: 115 mg/dL — AB (ref 65–99)
GLUCOSE-CAPILLARY: 113 mg/dL — AB (ref 65–99)
Glucose-Capillary: 112 mg/dL — ABNORMAL HIGH (ref 65–99)
Glucose-Capillary: 78 mg/dL (ref 65–99)
Glucose-Capillary: 88 mg/dL (ref 65–99)
Glucose-Capillary: 81 mg/dL (ref 65–99)
Glucose-Capillary: 75 mg/dL (ref 65–99)

## 2016-12-28 LAB — BLOOD GAS, ARTERIAL
Acid-Base Excess: 1.1 mmol/L (ref 0.0–2.0)
Bicarbonate: 24.6 mmol/L (ref 20.0–28.0)
DRAWN BY: 511841
FIO2: 21
O2 SAT: 95.3 %
PATIENT TEMPERATURE: 98.8
pCO2 arterial: 35.2 mmHg (ref 32.0–48.0)
pH, Arterial: 7.459 — ABNORMAL HIGH (ref 7.350–7.450)
pO2, Arterial: 75.9 mmHg — ABNORMAL LOW (ref 83.0–108.0)

## 2016-12-28 MED ORDER — FENTANYL CITRATE (PF) 100 MCG/2ML IJ SOLN
12.5000 ug | INTRAMUSCULAR | Status: DC | PRN
  Filled 2016-12-28: qty 2

## 2016-12-28 MED ORDER — POTASSIUM CHLORIDE 20 MEQ/15ML (10%) PO SOLN
40.0000 meq | Freq: Once | ORAL | Status: AC
  Administered 2016-12-28: 40 meq via ORAL
  Filled 2016-12-28: qty 30

## 2016-12-28 NOTE — Progress Notes (Addendum)
ANTICOAGULATION/ANTIBIOTIC CONSULT NOTE - Follow Up Consult  Pharmacy Consult for Lovenox and Daptomycin Indication: atrial fibrillation and MRSE bacteremia  No Known Allergies  Patient Measurements: Height: 6\' 1"  (185.4 cm) Weight: 183 lb 6.8 oz (83.2 kg) IBW/kg (Calculated) : 79.9  Vital Signs: Temp: 98.8 F (37.1 C) (09/10 0815) Temp Source: Oral (09/10 0815) BP: 130/51 (09/10 0815) Pulse Rate: 75 (09/10 0815)  Labs:  Recent Labs  12/25/16 1020 12/26/16 0932 12/28/16 0534  HGB  --  8.2* 9.0*  HCT  --  27.4* 30.2*  PLT  --  284 343  CREATININE  --  0.96 0.82  CKTOTAL 48*  --   --     Estimated Creatinine Clearance: 90.7 mL/min (by C-G formula based on SCr of 0.82 mg/dL).  Assessment: 71 yo M with hx of Afib. On Xarelto PTA and then transitioned to heparin. On 8/26 was switched back to Xarelto but then had clinical worsening and is now on Lovenox. Patient is NPO and was started on TPN but currently being held. CBC/renal function stable.   He also continues on day #3 daptomycin for MRSE bacteremia and zosyn for perforated pyloric ulcer. Baseline CK 48.   9/7 Daptomycin >> 8/10 Zosyn >> 8/22, 8/25>>8/26, 9/3 >>  8/10 Vancomycin x1, 9/5 >> 9/7 8/10 Diflucan >>8/15 8/15 Flagyl >>8/22 8/15 Biaxin >> 8/29 8/22 Amoxicillin>>8/25, 8/26 >> 8/29 8/25 Unasyn >>8/25 -On BID PPI  8/10 BCx: negative 8/10 Abd wound: normal skin flora 8/10 MRSA PCR: neg 8/9 BCx: neg 8/9 UCx: > 100K E coli R to Unaysn/amp, sens to all others 8/10 BCx: Neg 8/17 C. Diff: negative 8/18 Resp Cx: normal flora 8/17 UCx: neg 8/17 BCx: neg 8/23 BCx: neg 8/29 BCx: ngtd 9/3 BCx: MRSE in 2/4  9/6 ascitic fluid: neg 9/7 BCx >> ngtd   Goal of Therapy:  Anti-Xa level 0.6-1 units/ml 4hrs after LMWH dose given Monitor platelets by anticoagulation protocol: Yes   Plan:  1) Continue lovenox 85mg  sq q12 2) Continue daptomycin 500mg  IV q24 (~ 6mg /kg q24) 3) Check weekly CK (qFriday) 4) Continue  zosyn 3.375g IV q8 (EI) per MD  Fredrik Rigger 12/28/2016,8:30 AM

## 2016-12-28 NOTE — Progress Notes (Signed)
INFECTIOUS DISEASE PROGRESS NOTE  ID: Jacob Pittman is a 71 y.o. 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   Abtx:  Anti-infectives    Start     Dose/Rate Route Frequency Ordered Stop   12/25/16 1600  DAPTOmycin (CUBICIN) 500 mg in sodium chloride 0.9 % IVPB     500 mg 220 mL/hr over 30 Minutes Intravenous Every 24 hours 12/25/16 1002     12/25/16 1300  DAPTOmycin (CUBICIN) 500 mg in sodium chloride 0.9 % IVPB  Status:  Discontinued     500 mg 220 mL/hr over 30 Minutes Intravenous Every 24 hours 12/25/16 0956 12/25/16 1002   12/23/16 1800  vancomycin (VANCOCIN) IVPB 750 mg/150 ml premix  Status:  Discontinued     750 mg 150 mL/hr over 60 Minutes Intravenous Every 12 hours 12/23/16 0310 12/25/16 0941   12/23/16 0145  vancomycin (VANCOCIN) 1,750 mg in sodium chloride 0.9 % 500 mL IVPB     1,750 mg 250 mL/hr over 120 Minutes Intravenous  Once 12/23/16 0138 12/23/16 0353   12/21/16 1500  piperacillin-tazobactam (ZOSYN) IVPB 3.375 g     3.375 g 12.5 mL/hr over 240 Minutes Intravenous Every 8 hours 12/21/16 1456     12/13/16 1000  amoxicillin (AMOXIL) 250 MG/5ML suspension 1,000 mg     1,000 mg Per Tube Every 12 hours 12/13/16 0935 12/16/16 1833   12/12/16 1900  piperacillin-tazobactam (ZOSYN) IVPB 3.375 g  Status:  Discontinued     3.375 g 12.5 mL/hr over 240 Minutes  Intravenous Every 8 hours 12/12/16 1759 12/13/16 0911   12/12/16 1400  Ampicillin-Sulbactam (UNASYN) 3 g in sodium chloride 0.9 % 100 mL IVPB  Status:  Discontinued     3 g 200 mL/hr over 30 Minutes Intravenous Every 6 hours 12/12/16 1215 12/12/16 1753   12/10/16 0600  amoxicillin (AMOXIL) 250 MG/5ML suspension 1,000 mg  Status:  Discontinued     1,000 mg Per Tube Every 12 hours 12/09/16 1701 12/12/16 1206   12/09/16 1130  amoxicillin (AMOXIL) 250 MG/5ML suspension 1,000 mg  Status:  Discontinued     1,000 mg Oral Every 12 hours 12/09/16 1117 12/09/16 1118   12/09/16 1130  amoxicillin (AMOXIL) 250 MG/5ML suspension 1,000 mg  Status:  Discontinued     1,000 mg Per Tube Every 12 hours 12/09/16 1118 12/09/16 1701   12/04/16 2200  clarithromycin (BIAXIN) tablet 500 mg     500 mg Per Tube Every 12 hours 12/04/16 1107 12/16/16 2158   12/04/16 2000  piperacillin-tazobactam (ZOSYN) IVPB 3.375 g     3.375 g 12.5 mL/hr over 240 Minutes Intravenous Every 8 hours 12/04/16 1339 12/08/16 2359   12/04/16 1600  metroNIDAZOLE (FLAGYL) tablet 500 mg  Status:  Discontinued     500 mg Per Tube Every 8 hours 12/04/16 1107 12/09/16 1117   12/02/16 1100  clarithromycin (BIAXIN) 250  MG/5ML suspension 500 mg  Status:  Discontinued     500 mg Per Tube Every 12 hours 12/02/16 1012 12/04/16 1107   12/02/16 1045  metroNIDAZOLE (FLAGYL) 50 mg/ml oral suspension 500 mg  Status:  Discontinued     500 mg Per Tube 3 times daily 12/02/16 1036 12/04/16 1107   12/02/16 1015  metroNIDAZOLE (FLAGYL) 50 mg/ml oral suspension 250 mg  Status:  Discontinued     250 mg Per Tube 4 times daily 12/02/16 1011 12/02/16 1036   11/28/16 0800  fluconazole (DIFLUCAN) IVPB 200 mg  Status:  Discontinued     200 mg 100 mL/hr over 60 Minutes Intravenous Every 24 hours 11/27/16 0624 12/02/16 1018   11/27/16 0630  fluconazole (DIFLUCAN) IVPB 400 mg     400 mg 100 mL/hr over 120 Minutes Intravenous  Once 11/27/16 0622 11/27/16 0841   11/27/16  0600  piperacillin-tazobactam (ZOSYN) IVPB 3.375 g  Status:  Discontinued     3.375 g 12.5 mL/hr over 240 Minutes Intravenous Every 8 hours 11/27/16 0534 12/04/16 1339   11/27/16 0515  piperacillin-tazobactam (ZOSYN) IVPB 3.375 g  Status:  Discontinued     3.375 g 100 mL/hr over 30 Minutes Intravenous  Once 11/27/16 0510 11/27/16 0518   11/27/16 0030  piperacillin-tazobactam (ZOSYN) IVPB 3.375 g     3.375 g 100 mL/hr over 30 Minutes Intravenous  Once 11/27/16 0016 11/27/16 0755   11/27/16 0030  vancomycin (VANCOCIN) IVPB 1000 mg/200 mL premix     1,000 mg 200 mL/hr over 60 Minutes Intravenous  Once 11/27/16 0016 11/27/16 0756      Medications:  Scheduled: . aspirin  81 mg Per Tube Daily  . chlorhexidine   Topical Once  . chlorhexidine gluconate (MEDLINE KIT)  15 mL Mouth Rinse BID  . enoxaparin (LOVENOX) injection  85 mg Subcutaneous Q12H  . furosemide  20 mg Intravenous Daily  . insulin aspart  0-20 Units Subcutaneous Q4H  . lidocaine (PF)  5 mL Intradermal Once  . mouth rinse  15 mL Mouth Rinse QID  . midodrine  2.5 mg Oral TID WC  . pantoprazole (PROTONIX) IV  40 mg Intravenous Q12H  . sodium chloride flush  5 mL Intravenous Q8H    Objective: Vital signs in last 24 hours: Temp:  [98.7 F (37.1 C)-99 F (37.2 C)] 99 F (37.2 C) (09/10 1202) Pulse Rate:  [43-84] 76 (09/10 1200) Resp:  [22-40] 32 (09/10 1200) BP: (107-142)/(46-69) 134/56 (09/10 1200) SpO2:  [92 %-100 %] 100 % (09/10 1200) Weight:  [83.2 kg (183 lb 6.8 oz)] 83.2 kg (183 lb 6.8 oz) (09/10 0407)   General appearance: alert and no distress Resp: clear to auscultation bilaterally Cardio: regular rate and rhythm GI: normal findings: bowel sounds normal and soft, non-tender Extremities: edema B UE  Lab Results  Recent Labs  12/26/16 0932 12/28/16 0534  WBC 6.3 6.9  HGB 8.2* 9.0*  HCT 27.4* 30.2*  NA 148* 147*  K 3.6 3.1*  CL 117* 116*  CO2 20* 23  BUN 30* 22*  CREATININE 0.96 0.82   Liver  Panel  Recent Labs  12/28/16 0534  PROT 5.7*  ALBUMIN 2.0*  AST 39  ALT 49  ALKPHOS 92  BILITOT 0.6   Sedimentation Rate No results for input(s): ESRSEDRATE in the last 72 hours. C-Reactive Protein No results for input(s): CRP in the last 72 hours.  Microbiology: Recent Results (from the past 240 hour(s))  Culture, blood (Routine X 2) w Reflex  to ID Panel     Status: Abnormal   Collection Time: 12/21/16  6:32 PM  Result Value Ref Range Status   Specimen Description BLOOD LEFT HAND  Final   Special Requests   Final    BOTTLES DRAWN AEROBIC ONLY Blood Culture adequate volume   Culture  Setup Time   Final    GRAM POSITIVE COCCI IN CLUSTERS AEROBIC BOTTLE ONLY CRITICAL RESULT CALLED TO, READ BACK BY AND VERIFIED WITH: K COOK PHARMD [id number] 12/22/16 A BROWNING    Culture (A)  Final    STAPHYLOCOCCUS EPIDERMIDIS SUSCEPTIBILITIES PERFORMED ON PREVIOUS CULTURE WITHIN THE LAST 5 DAYS.    Report Status 12/25/2016 FINAL  Final  Blood Culture ID Panel (Reflexed)     Status: Abnormal   Collection Time: 12/21/16  6:32 PM  Result Value Ref Range Status   Enterococcus species NOT DETECTED NOT DETECTED Final   Listeria monocytogenes NOT DETECTED NOT DETECTED Final   Staphylococcus species DETECTED (A) NOT DETECTED Final    Comment: Methicillin (oxacillin) resistant coagulase negative staphylococcus. Possible blood culture contaminant (unless isolated from more than one blood culture draw or clinical case suggests pathogenicity). No antibiotic treatment is indicated for blood  culture contaminants. CRITICAL RESULT CALLED TO, READ BACK BY AND VERIFIED WITH: K COOK PHARMD [id number] 12/22/16 A BROWNING    Staphylococcus aureus NOT DETECTED NOT DETECTED Final   Methicillin resistance DETECTED (A) NOT DETECTED Final    Comment: CRITICAL RESULT CALLED TO, READ BACK BY AND VERIFIED WITH: K COOK PHARMD [id number] 12/22/16 A BROWNING    Streptococcus species NOT DETECTED NOT DETECTED Final   Streptococcus  agalactiae NOT DETECTED NOT DETECTED Final   Streptococcus pneumoniae NOT DETECTED NOT DETECTED Final   Streptococcus pyogenes NOT DETECTED NOT DETECTED Final   Acinetobacter baumannii NOT DETECTED NOT DETECTED Final   Enterobacteriaceae species NOT DETECTED NOT DETECTED Final   Enterobacter cloacae complex NOT DETECTED NOT DETECTED Final   Escherichia coli NOT DETECTED NOT DETECTED Final   Klebsiella oxytoca NOT DETECTED NOT DETECTED Final   Klebsiella pneumoniae NOT DETECTED NOT DETECTED Final   Proteus species NOT DETECTED NOT DETECTED Final   Serratia marcescens NOT DETECTED NOT DETECTED Final   Haemophilus influenzae NOT DETECTED NOT DETECTED Final   Neisseria meningitidis NOT DETECTED NOT DETECTED Final   Pseudomonas aeruginosa NOT DETECTED NOT DETECTED Final   Candida albicans NOT DETECTED NOT DETECTED Final   Candida glabrata NOT DETECTED NOT DETECTED Final   Candida krusei NOT DETECTED NOT DETECTED Final   Candida parapsilosis NOT DETECTED NOT DETECTED Final   Candida tropicalis NOT DETECTED NOT DETECTED Final  Culture, blood (Routine X 2) w Reflex to ID Panel     Status: Abnormal   Collection Time: 12/21/16  6:38 PM  Result Value Ref Range Status   Specimen Description BLOOD RIGHT ARM  Final   Special Requests IN PEDIATRIC BOTTLE Blood Culture adequate volume  Final   Culture  Setup Time   Final    GRAM POSITIVE COCCI IN CLUSTERS IN PEDIATRIC BOTTLE CRITICAL VALUE NOTED.  VALUE IS CONSISTENT WITH PREVIOUSLY REPORTED AND CALLED VALUE.    Culture STAPHYLOCOCCUS EPIDERMIDIS (A)  Final   Report Status 12/25/2016 FINAL  Final   Organism ID, Bacteria STAPHYLOCOCCUS EPIDERMIDIS  Final      Susceptibility   Staphylococcus epidermidis - MIC*    CIPROFLOXACIN >=8 RESISTANT Resistant     ERYTHROMYCIN >=8 RESISTANT Resistant     GENTAMICIN 8 INTERMEDIATE Intermediate  OXACILLIN RESISTANT Resistant     TETRACYCLINE 2 SENSITIVE Sensitive     VANCOMYCIN 4 SENSITIVE Sensitive      TRIMETH/SULFA 160 RESISTANT Resistant     CLINDAMYCIN >=8 RESISTANT Resistant     RIFAMPIN <=0.5 SENSITIVE Sensitive     Inducible Clindamycin NEGATIVE Sensitive     * STAPHYLOCOCCUS EPIDERMIDIS  Surgical pcr screen     Status: None   Collection Time: 12/21/16  9:33 PM  Result Value Ref Range Status   MRSA, PCR NEGATIVE NEGATIVE Final   Staphylococcus aureus NEGATIVE NEGATIVE Final    Comment: (NOTE) The Xpert SA Assay (FDA approved for NASAL specimens in patients 54 years of age and older), is one component of a comprehensive surveillance program. It is not intended to diagnose infection nor to guide or monitor treatment.   Body fluid culture     Status: Abnormal   Collection Time: 12/24/16 12:13 PM  Result Value Ref Range Status   Specimen Description FLUID  Final   Special Requests ASCITIES  Final   Gram Stain   Final    RARE WBC PRESENT,BOTH PMN AND MONONUCLEAR NO ORGANISMS SEEN    Culture (A)  Final    MULTIPLE ORGANISMS PRESENT, NONE PREDOMINANT CRITICAL RESULT CALLED TO, READ BACK BY AND VERIFIED WITH: A. DAVIS RN, AT 1429 12/25/16 BY D. VANHOOK REGARDING CULTURE GROWTH    Report Status 12/26/2016 FINAL  Final  Culture, blood (routine x 2)     Status: None (Preliminary result)   Collection Time: 12/25/16  2:21 AM  Result Value Ref Range Status   Specimen Description BLOOD LEFT HAND  Final   Special Requests IN PEDIATRIC BOTTLE Blood Culture adequate volume  Final   Culture NO GROWTH 2 DAYS  Final   Report Status PENDING  Incomplete  Culture, blood (routine x 2)     Status: None (Preliminary result)   Collection Time: 12/25/16  2:35 AM  Result Value Ref Range Status   Specimen Description BLOOD LEFT WRIST  Final   Special Requests IN PEDIATRIC BOTTLE Blood Culture adequate volume  Final   Culture NO GROWTH 2 DAYS  Final   Report Status PENDING  Incomplete    Studies/Results: Dg Chest Port 1 View  Result Date: 12/27/2016 CLINICAL DATA:  Pleural effusion. EXAM:  PORTABLE CHEST 1 VIEW COMPARISON:  Most recent chest radiograph yesterday. FINDINGS: Enteric tube in place, tip below the diaphragm. Left-sided pacemaker remains in place. Stable cardiomegaly and mediastinal contours. Hazy opacity at the lung bases consistent with pleural effusions and likely underlying atelectasis, worsening prior exam. Worsening vascular congestion. No pneumothorax. IMPRESSION: Worsening hazy opacity at the lung bases consistent with increasing pleural effusions/atelectasis. Worsening vascular congestion. Electronically Signed   By: Jeb Levering M.D.   On: 12/27/2016 17:18     Assessment/Plan: MRSE bacteremia MIC elevated (4) CK fine Central line out 9-6 Check TTE does not specifically mention endocarditis. Again, given his overall co-morbidities, would not check TEE.  Repeat BCx sent 9-7- negative 3 days, can replace central line.   Perforated gastric ulcer Intra-abdominal abscess Cx- poly-microbial. No change in anbx.  Stool draining from wound, defer to surgery.   ICD Apparently fired  CV to f/u?  Total days of antibiotics:  dapto 9-7 Zosyn 9-3         Bobby Rumpf Infectious Diseases (pager) [phone] [email or web address] 12/28/2016, 2:37 PM  LOS: 31 days

## 2016-12-28 NOTE — Progress Notes (Signed)
Patient ID: Jacob Pittman, male   DOB: 10/23/1945, 71 y.o.   MRN: [id number]  [facility] Surgery Progress Note  28 Days Post-Op  Subjective: CC- perforated ulcer Patient with no complaints this morning. Denies abdominal pain, nausea, or vomiting. TF at 40mL/hr. States that he is not passing flatus. Last BM 9/8.  Objective: Vital signs in last 24 hours: Temp:  [97.3 F (36.3 C)-98.9 F (37.2 C)] 98.8 F (37.1 C) (09/10 0815) Pulse Rate:  [74-89] 75 (09/10 0815) Resp:  [22-31] 24 (09/10 0815) BP: (105-142)/(46-69) 130/51 (09/10 0815) SpO2:  [92 %-100 %] 100 % (09/10 0815) Weight:  [183 lb 6.8 oz (83.2 kg)] 183 lb 6.8 oz (83.2 kg) (09/10 0407) Last BM Date: 12/24/16  Intake/Output from previous day: 09/09 0701 - 09/10 0700 In: 705 [I.V.:5; NG/GT:440; IV Piggyback:260] Out: 1280 [Urine:1275; Drains:5] Intake/Output this shift: No intake/output data recorded.  PE: Gen:  Alert, NAD, cooperative HEENT: EOM's intact, pupils equal and round, Cortrak in place Pulm:  CTAB, no W/R/R, effort normal Abd: Soft, mild distension, +BS, drain site clear with minimal drainage, open midline incision actively draining enteric contents from distal aspect of wound     Lab Results:   Recent Labs  12/26/16 0932 12/28/16 0534  WBC 6.3 6.9  HGB 8.2* 9.0*  HCT 27.4* 30.2*  PLT 284 343   BMET  Recent Labs  12/26/16 0932 12/28/16 0534  NA 148* 147*  K 3.6 3.1*  CL 117* 116*  CO2 20* 23  GLUCOSE 121* 110*  BUN 30* 22*  CREATININE 0.96 0.82  CALCIUM 7.5* 7.7*   PT/INR No results for input(s): LABPROT, INR in the last 72 hours. CMP     Component Value Date/Time   NA 147 (H) 12/28/2016 0534   K 3.1 (L) 12/28/2016 0534   CL 116 (H) 12/28/2016 0534   CO2 23 12/28/2016 0534   GLUCOSE 110 (H) 12/28/2016 0534   BUN 22 (H) 12/28/2016 0534   CREATININE 0.82 12/28/2016 0534   CREATININE 0.91 03/04/2015 1618   CALCIUM 7.7 (L) 12/28/2016 0534   PROT 5.7 (L) 12/28/2016 0534   ALBUMIN 2.0 (L) 12/28/2016 0534   AST 39 12/28/2016 0534   ALT 49 12/28/2016 0534   ALKPHOS 92 12/28/2016 0534   BILITOT 0.6 12/28/2016 0534   GFRNONAA >60 12/28/2016 0534   GFRAA >60 12/28/2016 0534   Lipase     Component Value Date/Time   LIPASE 20 12/01/2016 0304       Studies/Results: Dg Chest Port 1 View  Result Date: 12/27/2016 CLINICAL DATA:  Pleural effusion. EXAM: PORTABLE CHEST 1 VIEW COMPARISON:  Most recent chest radiograph yesterday. FINDINGS: Enteric tube in place, tip below the diaphragm. Left-sided pacemaker remains in place. Stable cardiomegaly and mediastinal contours. Hazy opacity at the lung bases consistent with pleural effusions and likely underlying atelectasis, worsening prior exam. Worsening vascular congestion. No pneumothorax. IMPRESSION: Worsening hazy opacity at the lung bases consistent with increasing pleural effusions/atelectasis. Worsening vascular congestion. Electronically Signed   By: Rubye Oaks M.D.   On: 12/27/2016 17:18    Anti-infectives: Anti-infectives    Start     Dose/Rate Route Frequency Ordered Stop   12/25/16 1600  DAPTOmycin (CUBICIN) 500 mg in sodium chloride 0.9 % IVPB     500 mg 220 mL/hr over 30 Minutes Intravenous Every 24 hours 12/25/16 1002     12/25/16 1300  DAPTOmycin (CUBICIN) 500 mg in sodium chloride 0.9 % IVPB  Status:  Discontinued  500 mg 220 mL/hr over 30 Minutes Intravenous Every 24 hours 12/25/16 0956 12/25/16 1002   12/23/16 1800  vancomycin (VANCOCIN) IVPB 750 mg/150 ml premix  Status:  Discontinued     750 mg 150 mL/hr over 60 Minutes Intravenous Every 12 hours 12/23/16 0310 12/25/16 0941   12/23/16 0145  vancomycin (VANCOCIN) 1,750 mg in sodium chloride 0.9 % 500 mL IVPB     1,750 mg 250 mL/hr over 120 Minutes Intravenous  Once 12/23/16 0138 12/23/16 0353   12/21/16 1500  piperacillin-tazobactam (ZOSYN) IVPB 3.375 g     3.375 g 12.5 mL/hr over 240 Minutes Intravenous Every 8 hours 12/21/16 1456      12/13/16 1000  amoxicillin (AMOXIL) 250 MG/5ML suspension 1,000 mg     1,000 mg Per Tube Every 12 hours 12/13/16 0935 12/16/16 1833   12/12/16 1900  piperacillin-tazobactam (ZOSYN) IVPB 3.375 g  Status:  Discontinued     3.375 g 12.5 mL/hr over 240 Minutes Intravenous Every 8 hours 12/12/16 1759 12/13/16 0911   12/12/16 1400  Ampicillin-Sulbactam (UNASYN) 3 g in sodium chloride 0.9 % 100 mL IVPB  Status:  Discontinued     3 g 200 mL/hr over 30 Minutes Intravenous Every 6 hours 12/12/16 1215 12/12/16 1753   12/10/16 0600  amoxicillin (AMOXIL) 250 MG/5ML suspension 1,000 mg  Status:  Discontinued     1,000 mg Per Tube Every 12 hours 12/09/16 1701 12/12/16 1206   12/09/16 1130  amoxicillin (AMOXIL) 250 MG/5ML suspension 1,000 mg  Status:  Discontinued     1,000 mg Oral Every 12 hours 12/09/16 1117 12/09/16 1118   12/09/16 1130  amoxicillin (AMOXIL) 250 MG/5ML suspension 1,000 mg  Status:  Discontinued     1,000 mg Per Tube Every 12 hours 12/09/16 1118 12/09/16 1701   12/04/16 2200  clarithromycin (BIAXIN) tablet 500 mg     500 mg Per Tube Every 12 hours 12/04/16 1107 12/16/16 2158   12/04/16 2000  piperacillin-tazobactam (ZOSYN) IVPB 3.375 g     3.375 g 12.5 mL/hr over 240 Minutes Intravenous Every 8 hours 12/04/16 1339 12/08/16 2359   12/04/16 1600  metroNIDAZOLE (FLAGYL) tablet 500 mg  Status:  Discontinued     500 mg Per Tube Every 8 hours 12/04/16 1107 12/09/16 1117   12/02/16 1100  clarithromycin (BIAXIN) 250 MG/5ML suspension 500 mg  Status:  Discontinued     500 mg Per Tube Every 12 hours 12/02/16 1012 12/04/16 1107   12/02/16 1045  metroNIDAZOLE (FLAGYL) 50 mg/ml oral suspension 500 mg  Status:  Discontinued     500 mg Per Tube 3 times daily 12/02/16 1036 12/04/16 1107   12/02/16 1015  metroNIDAZOLE (FLAGYL) 50 mg/ml oral suspension 250 mg  Status:  Discontinued     250 mg Per Tube 4 times daily 12/02/16 1011 12/02/16 1036   11/28/16 0800  fluconazole (DIFLUCAN) IVPB 200 mg  Status:   Discontinued     200 mg 100 mL/hr over 60 Minutes Intravenous Every 24 hours 11/27/16 0624 12/02/16 1018   11/27/16 0630  fluconazole (DIFLUCAN) IVPB 400 mg     400 mg 100 mL/hr over 120 Minutes Intravenous  Once 11/27/16 0622 11/27/16 0841   11/27/16 0600  piperacillin-tazobactam (ZOSYN) IVPB 3.375 g  Status:  Discontinued     3.375 g 12.5 mL/hr over 240 Minutes Intravenous Every 8 hours 11/27/16 0534 12/04/16 1339   11/27/16 0515  piperacillin-tazobactam (ZOSYN) IVPB 3.375 g  Status:  Discontinued     3.375 g  100 mL/hr over 30 Minutes Intravenous  Once 11/27/16 0510 11/27/16 0518   11/27/16 0030  piperacillin-tazobactam (ZOSYN) IVPB 3.375 g     3.375 g 100 mL/hr over 30 Minutes Intravenous  Once 11/27/16 0016 11/27/16 0755   11/27/16 0030  vancomycin (VANCOCIN) IVPB 1000 mg/200 mL premix     1,000 mg 200 mL/hr over 60 Minutes Intravenous  Once 11/27/16 0016 11/27/16 0756       Assessment/Plan Perforated pyloric ulcer  S/P EXPLORATORY LAPAROTOMY, GRAHAM PATCH REPAIR8/10/18 Dr. 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 Darrick Penna CVA - on lovenox and aspirin, xarelto? Malnutrition - prealbumin 8.2 (9/7) Hypernatremia/hyperchloremia Dysphagia DM Acute renal failure - improved, Cr 0.82  FEN: IVF, NPO, hold TF ID:  zosyn 8/9- 12/08/16, Diflucan 8/9-8/15/18, clarithromycin 8/17 - 8/29,Amoxicillin 8/23-29/18 =>>day 15 of antibiotics  Vancomycin restarted 9/5>>9/7, Zosyn restarted 9/3>>, Daptomycin 9/7>> DVT: lovenox  Plan:  Wound now draining enteric contents.  Hold tube feeds. Recommend PICC/TNA.  Will discuss with MD, repeat CT?    LOS: 31 days    Franne Forts , [facility] Surgery 12/28/2016, 8:45 AM Pager: [phone] Consults: [phone] Mon-Fri 7:00 am-4:30 pm Sat-Sun 7:00 am-11:30 am

## 2016-12-28 NOTE — Progress Notes (Signed)
SLP Cancellation Note  Patient Details Name: Jacob Pittman MRN: [id number] DOB: 1945/08/30   Cancelled treatment:       Reason Eval/Treat Not Completed: Medical issues which prohibited therapy. Per surgical team's note today, "Wound now draining enteric contents. Hold tube feeds.Recommend PICC/TNA." Pt not medically ready for POs at this time. It has been 10 days since he has been medically able to participate in swallowing trials. SLP to sign off at this time - please reorder when appropriate.    [name] 12/28/2016, 1:26 PM  [name], M.A. CCC-SLP [phone]

## 2016-12-28 NOTE — Progress Notes (Signed)
Physical Therapy Treatment Patient Details Name: Jacob Pittman MRN: [id number] DOB: January 27, 1946 Today's Date: 12/28/2016    History of Present Illness Patient is a 71 yo male admitted 11/26/16 with perforated duodenal bulb ulcer, now s/p exploratory lap and repair on 11/27/16. Postoperatively remained in shock requiring epinephrine and Levophed drip. Also complicated by ischemic RUE and is s/p embolectomy.  Patient was slow to wean from vent, extubated on 12/10/16.  Patient with confusion.     PMH:   ICM, DM, HTN, PAD, OSA, CHF, EF 15%, ICD, PAF, OA    PT Comments    Patient lethargic during session today and demonstrates some confusion. Demonstrates impaired attention, memory, awareness and problem solving.  Tolerated sitting EOB ~5 mins with total A for support. Pt not engaging in any core activation and requires cues to stay attended to task. Session limited due to oozing of stool from abdominal incision. RN notified. Will continue to follow.   Follow Up Recommendations  SNF;Supervision/Assistance - 24 hour     Equipment Recommendations  Other (comment) (TBA)    Recommendations for Other Services       Precautions / Restrictions Precautions Precautions: Fall Precaution Comments: NG tube; feeding tube; Jp drain Restrictions Weight Bearing Restrictions: No    Mobility  Bed Mobility Overal bed mobility: Needs Assistance Bed Mobility: Rolling;Sidelying to Sit;Sit to Sidelying Rolling: Max assist;+2 for physical assistance Sidelying to sit: Max assist;+2 for physical assistance;HOB elevated     Sit to sidelying: Max assist;+2 for physical assistance;HOB elevated General bed mobility comments: Cues for sequencing, able to initiate moving BLEs but not bringing them to EOB, assist with trunk. Assist with bringing BLEs into bed and lowering trunk.  Transfers                    Ambulation/Gait                 Stairs            Wheelchair Mobility     Modified Rankin (Stroke Patients Only)       Balance Overall balance assessment: Needs assistance Sitting-balance support: Bilateral upper extremity supported;Feet supported Sitting balance-Leahy Scale: Poor Sitting balance - Comments: Required Max A for static sitting balance with posterior lean and posterior pelvic tilt; not able to initiate trunk activation today.  Postural control: Posterior lean                                  Cognition Arousal/Alertness: Lethargic Behavior During Therapy: WFL for tasks assessed/performed Overall Cognitive Status: Impaired/Different from baseline Area of Impairment: Orientation;Attention;Memory;Safety/judgement;Awareness;Problem solving                 Orientation Level: Disoriented to;Place;Time;Situation Current Attention Level: Sustained Memory: Decreased short-term memory Following Commands: Follows one step commands inconsistently;Follows one step commands with increased time Safety/Judgement: Decreased awareness of deficits Awareness: Intellectual Problem Solving: Slow processing;Decreased initiation;Requires verbal cues;Requires tactile cues;Difficulty sequencing General Comments: Low toned voice. Difficult to understand at times. Adamant he is not in the hospital. Eyes closed at times sitting EOB. "I have not been stuck in the bed"      Exercises General Exercises - Lower Extremity Long Arc Quad: AROM;Both;5 reps;Seated;AAROM    General Comments General comments (skin integrity, edema, etc.): Session cut short as pt with oozing stool from abdominal incision. RN notified.       Pertinent Vitals/Pain Pain Assessment: Faces Faces Pain  Scale: No hurt    Home Living                      Prior Function            PT Goals (current goals can now be found in the care plan section) Progress towards PT goals: Not progressing toward goals - comment (secondary to lethargy/confusion)     Frequency    Min 2X/week      PT Plan Current plan remains appropriate    Co-evaluation              AM-PAC PT "6 Clicks" Daily Activity  Outcome Measure  Difficulty turning over in bed (including adjusting bedclothes, sheets and blankets)?: Unable Difficulty moving from lying on back to sitting on the side of the bed? : Unable Difficulty sitting down on and standing up from a chair with arms (e.g., wheelchair, bedside commode, etc,.)?: Unable Help needed moving to and from a bed to chair (including a wheelchair)?: Total Help needed walking in hospital room?: Total Help needed climbing 3-5 steps with a railing? : Total 6 Click Score: 6    End of Session   Activity Tolerance: Patient limited by fatigue;Patient limited by lethargy Patient left: in bed;with call bell/phone within reach;Other (comment) (prevalon boots) Nurse Communication: Mobility status;Need for lift equipment PT Visit Diagnosis: Muscle weakness (generalized) (M62.81);Difficulty in walking, not elsewhere classified (R26.2)     Time: 8295-6213 PT Time Calculation (min) (ACUTE ONLY): 15 min  Charges:  $Therapeutic Activity: 8-22 mins                    G Codes:       Mylo Red, PT, DPT [phone]     Blake Divine A Brooklin Rieger 12/28/2016, 3:44 PM

## 2016-12-28 NOTE — Progress Notes (Signed)
Dressing change done to ABD wound - when cleaning wound applied very light pressure to bottom of wound - expressed mod amt of light brown stool - foul smelling from wound - cleansed and redressed - Pt has  JP drain Lt lower ABD that does not drain anything - rechanrged at this time .

## 2016-12-28 NOTE — Progress Notes (Signed)
PROGRESS NOTE    Jacob Pittman  ZOX:[id number] DOB: [birth year] DOA: 11/26/2016 PCP: Biagio Borg, MD   Brief Narrative: Jacob Pittman is a 71 y.o. 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 Was found to have a perforated pyloric channel ulcer and pneumoperitonitis s/p omental patch repair subsequently on epinephrine and Levophed.  8/12-  TTE LV moderately dilated with EF 15% &diffuse hypokinesis. There is akinesis of the inferolateral and inferior myocardium.  E coli UTI noted 8/13-  right brachial A- line placed and then developed ischemic right arm- vascular consult and transfer from [facility] to [facility]- thrombectomy of brachial artery- heparin Paroxysmal A-fib also noted 8/14- Decreased attenuation involving the right dentate nucleus in the right cerebellum and immediately adjacent cerebellar parenchyma, concerning for recent and potentially acute infarct in this area.  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- anticoagulation recommended to be stopped for 10-14 days - H pyloli + started on triple therapy - Transaminitis thought to be du to Diflucan hepatotoxicity 8/17 - febrile again and vasopressors resumed 8/23- extubated 8/25 A-fib with HR up to 160s, fever 101.6 at 4 AM- care transferred to [facility] 9/3 - stool noted to be coming out of wound   Assessment & Plan:   Principal Problem:   Perforated duodenal bulb ulcer ([place]) Active Problems:   Acute on chronic combined systolic and diastolic CHF (congestive heart failure) ([place])   Nonischemic cardiomyopathy (HCC)   Pneumoperitoneum   Acute respiratory failure with hypoxia (HCC)   Paroxysmal A-fib ([place])   CVA (cerebral vascular accident) ([place])   Arterial thrombosis ([place])   H pylori ulcer   Fatty infiltration of liver   DM (diabetes mellitus), type 2 (HCC)   Pressure sore on heel, left, unstageable ([place])   Poor venous  access   Palliative care encounter   Goals of care, counseling/discussion   Perforated duodenal bulb ulcer Peritonitis H. Pylori ulcer Septic shock Abdominal Abscess Patient has had a prolonged hospital course secondary to above problems. Patient has received multiple antibiotic therapies. He had evidence of a abscess tracking along sigmoid colon mesentery/omentum with drainage through the wound, evidence of wound necrosis. S/p drain placement with cultures significant for multiple organisms. -General surgery recommendations: tube feeds prior to trying by mouth nutrition; when cleared by surgery, re-consult speech therapy -Palliative care recommendations: Continue medical treatment for now -Interventional radiology recommendations -continue midodrine  Methicillin resistant staph species bacteremia Seen on 9/3 blood cultures x2. IJ removed 12/24/16 -infectious disease recommendations: daptomycin -repeat blood cultures obtained 12/25/16 and are pending (no growth x2 days)  Tachypnea Pulmonary edema on imaging. Improved with lasix. Stable.  Acute on chronic systolic and diastolic heart failure Repeat echo significant for improved EF of 25-30% from 15% with grade 2 diastolic dysfunction. Weight is down after lasix IV. -continue lasix '20mg'$  IV daily as blood pressure has improved -strict in/out -daily weights  Paroxysmal atrial fibrillation -continue metoprolol  Bilateral basilar atelectasis -continue to encourage incentive spirometry  Hypernatremia Improved slightly  Acute respiratory failure with hypoxia Previously intubated and successfully extubated. Secondary to sepsis. Oxygen saturations dropped to 80s overnight. Started on [place] oxygen. Abnormal breathing pattern per nurse report overnight. -ABG -continue oxygen  Hypokalemia -Supplement with potassium as needed  CVA Likely embolic from atrial fibrillation -continue Xarelto -continue aspirin  Arterial thrombosis Right  brachial artery. Secondary to A-line. S/p thrombectomy  Bilateral arm edema No DVT on  ultrasound -Lasix as above  Fatty liver  Diabetes mellitus, type 2 A1c of 5.8% -continue SSI  Pacer firing Read as asystole which does not appear to be the case. No arrhythmia noted. -cardiology for device interrogation.  Cognitive impairment Patient has been consistently oriented to person with me. No history of dementia. ?s/p CVA.   DVT prophylaxis: Xarelto Code Status: DNR Family Communication: None at bedside Disposition Plan: Discharge pending goals of care   Consultants:   General surgery  Interventional radiology  Palliative care medicine  Procedures:   8/10- ex lap- patch repair of pyloric ucler  8/13- brachial embolectomy 2 D ECHO Left ventricle: The cavity size was moderately dilated. Wallthickness was normal. Systolic function was severely reduced. Theestimated ejection fraction was 15%. Diffuse hypokinesis. Thereis akinesis of the inferolateral and inferior myocardium. Thestudy is not technically sufficient to allow evaluation of LVdiastolic function. - Aortic valve: There was mild regurgitation. - Mitral valve: There was moderate regurgitation. - Left atrium: The atrium was severely dilated. - Right ventricle: The cavity size was moderately dilated. Systolicfunction was moderately reduced. - Right atrium: The atrium was severely dilated. - Tricuspid valve: There was moderate regurgitation. - Pulmonary arteries: Systolic pressure was moderately increased.  Echocardiogram (12/25/16): EF of 25-30%, grade 2 diastolic dysfunction  Antimicrobials:  Amoxicillin  Unasyn  Clarithromycin  Metronidazole  Zosyn  Vancomycin  Daptomycin    Subjective: No pain  Objective: Vitals:   12/27/16 2335 12/28/16 0353 12/28/16 0407 12/28/16 0815  BP: 109/69 (!) 142/46  (!) 130/51  Pulse: 81 74  75  Resp: (!) 23 (!) 29  (!) 24  Temp: 98.7 F (37.1 C) 98.8  F (37.1 C)  98.8 F (37.1 C)  TempSrc: Oral Oral  Oral  SpO2: 92% 97%  100%  Weight:   83.2 kg (183 lb 6.8 oz)   Height:        Intake/Output Summary (Last 24 hours) at 12/28/16 0935 Last data filed at 12/28/16 0400  Gross per 24 hour  Intake              705 ml  Output             1280 ml  Net             -575 ml   Filed Weights   12/26/16 0446 12/27/16 0354 12/28/16 0407  Weight: 89 kg (196 lb 3.4 oz) 84.7 kg (186 lb 11.7 oz) 83.2 kg (183 lb 6.8 oz)    Examination:  General exam: Appears calm and comfortable Respiratory system: Clear to auscultation with decreased breath sounds bilaterally. Respiratory effort normal. Tachypnea. Cardiovascular system: S1 & S2 heard, RRR. 2/6 systolic murmur. Gastrointestinal system: Abdomen is nondistended, soft and nontender. Abdominal binder in place. Normal bowel sounds heard. Serous drainage Central nervous system: Sleeping Extremities: Bilateral upper extremity edema. No calf tenderness Skin: No cyanosis. No rashes Psychiatry: Judgement and insight appear impaired. Mood & affect depressed and flat.     Data Reviewed: I have personally reviewed following labs and imaging studies  CBC:  Recent Labs Lab 12/21/16 2103  12/23/16 0409 12/24/16 0448 12/25/16 0221 12/26/16 0932 12/28/16 0534  WBC 7.3  < > 7.4 7.9 9.8 6.3 6.9  NEUTROABS 5.4  --   --   --  8.2*  --   --   HGB 8.4*  < > 8.6* 8.5* 7.3* 8.2* 9.0*  HCT 27.3*  < > 28.1* 27.2* 23.5* 27.4* 30.2*  MCV 105.8*  < > 106.4*  106.7* 105.9* 108.3* 107.1*  PLT 247  < > 291 304 356 284 343  < > = values in this interval not displayed. Basic Metabolic Panel:  Recent Labs Lab 12/23/16 0409 12/24/16 0956 12/25/16 0221 12/26/16 0932 12/28/16 0534  NA 146* 146* 145 148* 147*  K 4.1 3.8 3.5 3.6 3.1*  CL 113* 113* 112* 117* 116*  CO2 '23 23 22 '$ 20* 23  GLUCOSE 135* 91 90 121* 110*  BUN 36* 50* 46* 30* 22*  CREATININE 1.31* 1.52* 1.35* 0.96 0.82  CALCIUM 7.4* 7.3* 7.4* 7.5*  7.7*  MG  --   --  2.3  --   --   PHOS 4.4  --  3.7  --   --    GFR: Estimated Creatinine Clearance: 90.7 mL/min (by C-G formula based on SCr of 0.82 mg/dL). Liver Function Tests:  Recent Labs Lab 12/22/16 0514 12/23/16 0409 12/25/16 0221 12/28/16 0534  AST 149* 161* 70* 39  ALT 125* 142* 99* 49  ALKPHOS 156* 143* 130* 92  BILITOT 0.7 0.9 0.9 0.6  PROT 6.0* 6.2* 6.2* 5.7*  ALBUMIN 1.6* 1.7* 1.8* 2.0*   No results for input(s): LIPASE, AMYLASE in the last 168 hours. No results for input(s): AMMONIA in the last 168 hours. Coagulation Profile:  Recent Labs Lab 12/23/16 0409  INR 1.28   Cardiac Enzymes:  Recent Labs Lab 12/25/16 1020  CKTOTAL 48*   BNP (last 3 results) No results for input(s): PROBNP in the last 8760 hours. HbA1C: No results for input(s): HGBA1C in the last 72 hours. CBG:  Recent Labs Lab 12/27/16 1222 12/27/16 1612 12/27/16 1943 12/27/16 2339 12/28/16 0356  GLUCAP 120* 108* 108* 100* 115*   Lipid Profile: No results for input(s): CHOL, HDL, LDLCALC, TRIG, CHOLHDL, LDLDIRECT in the last 72 hours. Thyroid Function Tests: No results for input(s): TSH, T4TOTAL, FREET4, T3FREE, THYROIDAB in the last 72 hours. Anemia Panel: No results for input(s): VITAMINB12, FOLATE, FERRITIN, TIBC, IRON, RETICCTPCT in the last 72 hours. Sepsis Labs:  Recent Labs Lab 12/21/16 2103  LATICACIDVEN 1.1    Recent Results (from the past 240 hour(s))  Culture, blood (Routine X 2) w Reflex to ID Panel     Status: Abnormal   Collection Time: 12/21/16  6:32 PM  Result Value Ref Range Status   Specimen Description BLOOD LEFT HAND  Final   Special Requests   Final    BOTTLES DRAWN AEROBIC ONLY Blood Culture adequate volume   Culture  Setup Time   Final    GRAM POSITIVE COCCI IN CLUSTERS AEROBIC BOTTLE ONLY CRITICAL RESULT CALLED TO, READ BACK BY AND VERIFIED WITH: K COOK PHARMD [id number] 12/22/16 A BROWNING    Culture (A)  Final    STAPHYLOCOCCUS  EPIDERMIDIS SUSCEPTIBILITIES PERFORMED ON PREVIOUS CULTURE WITHIN THE LAST 5 DAYS.    Report Status 12/25/2016 FINAL  Final  Blood Culture ID Panel (Reflexed)     Status: Abnormal   Collection Time: 12/21/16  6:32 PM  Result Value Ref Range Status   Enterococcus species NOT DETECTED NOT DETECTED Final   Listeria monocytogenes NOT DETECTED NOT DETECTED Final   Staphylococcus species DETECTED (A) NOT DETECTED Final    Comment: Methicillin (oxacillin) resistant coagulase negative staphylococcus. Possible blood culture contaminant (unless isolated from more than one blood culture draw or clinical case suggests pathogenicity). No antibiotic treatment is indicated for blood  culture contaminants. CRITICAL RESULT CALLED TO, READ BACK BY AND VERIFIED WITH: K COOK PHARMD [id number] 12/22/16 A BROWNING  Staphylococcus aureus NOT DETECTED NOT DETECTED Final   Methicillin resistance DETECTED (A) NOT DETECTED Final    Comment: CRITICAL RESULT CALLED TO, READ BACK BY AND VERIFIED WITH: K COOK PHARMD 2304 12/22/16 A BROWNING    Streptococcus species NOT DETECTED NOT DETECTED Final   Streptococcus agalactiae NOT DETECTED NOT DETECTED Final   Streptococcus pneumoniae NOT DETECTED NOT DETECTED Final   Streptococcus pyogenes NOT DETECTED NOT DETECTED Final   Acinetobacter baumannii NOT DETECTED NOT DETECTED Final   Enterobacteriaceae species NOT DETECTED NOT DETECTED Final   Enterobacter cloacae complex NOT DETECTED NOT DETECTED Final   Escherichia coli NOT DETECTED NOT DETECTED Final   Klebsiella oxytoca NOT DETECTED NOT DETECTED Final   Klebsiella pneumoniae NOT DETECTED NOT DETECTED Final   Proteus species NOT DETECTED NOT DETECTED Final   Serratia marcescens NOT DETECTED NOT DETECTED Final   Haemophilus influenzae NOT DETECTED NOT DETECTED Final   Neisseria meningitidis NOT DETECTED NOT DETECTED Final   Pseudomonas aeruginosa NOT DETECTED NOT DETECTED Final   Candida albicans NOT DETECTED NOT DETECTED  Final   Candida glabrata NOT DETECTED NOT DETECTED Final   Candida krusei NOT DETECTED NOT DETECTED Final   Candida parapsilosis NOT DETECTED NOT DETECTED Final   Candida tropicalis NOT DETECTED NOT DETECTED Final  Culture, blood (Routine X 2) w Reflex to ID Panel     Status: Abnormal   Collection Time: 12/21/16  6:38 PM  Result Value Ref Range Status   Specimen Description BLOOD RIGHT ARM  Final   Special Requests IN PEDIATRIC BOTTLE Blood Culture adequate volume  Final   Culture  Setup Time   Final    GRAM POSITIVE COCCI IN CLUSTERS IN PEDIATRIC BOTTLE CRITICAL VALUE NOTED.  VALUE IS CONSISTENT WITH PREVIOUSLY REPORTED AND CALLED VALUE.    Culture STAPHYLOCOCCUS EPIDERMIDIS (A)  Final   Report Status 12/25/2016 FINAL  Final   Organism ID, Bacteria STAPHYLOCOCCUS EPIDERMIDIS  Final      Susceptibility   Staphylococcus epidermidis - MIC*    CIPROFLOXACIN >=8 RESISTANT Resistant     ERYTHROMYCIN >=8 RESISTANT Resistant     GENTAMICIN 8 INTERMEDIATE Intermediate     OXACILLIN RESISTANT Resistant     TETRACYCLINE 2 SENSITIVE Sensitive     VANCOMYCIN 4 SENSITIVE Sensitive     TRIMETH/SULFA 160 RESISTANT Resistant     CLINDAMYCIN >=8 RESISTANT Resistant     RIFAMPIN <=0.5 SENSITIVE Sensitive     Inducible Clindamycin NEGATIVE Sensitive     * STAPHYLOCOCCUS EPIDERMIDIS  Surgical pcr screen     Status: None   Collection Time: 12/21/16  9:33 PM  Result Value Ref Range Status   MRSA, PCR NEGATIVE NEGATIVE Final   Staphylococcus aureus NEGATIVE NEGATIVE Final    Comment: (NOTE) The Xpert SA Assay (FDA approved for NASAL specimens in patients 29 years of age and older), is one component of a comprehensive surveillance program. It is not intended to diagnose infection nor to guide or monitor treatment.   Body fluid culture     Status: Abnormal   Collection Time: 12/24/16 12:13 PM  Result Value Ref Range Status   Specimen Description FLUID  Final   Special Requests ASCITIES  Final    Gram Stain   Final    RARE WBC PRESENT,BOTH PMN AND MONONUCLEAR NO ORGANISMS SEEN    Culture (A)  Final    MULTIPLE ORGANISMS PRESENT, NONE PREDOMINANT CRITICAL RESULT CALLED TO, READ BACK BY AND VERIFIED WITH: A. DAVIS RN, AT 1429 12/25/16 BY  D. VANHOOK REGARDING CULTURE GROWTH    Report Status 12/26/2016 FINAL  Final  Culture, blood (routine x 2)     Status: None (Preliminary result)   Collection Time: 12/25/16  2:21 AM  Result Value Ref Range Status   Specimen Description BLOOD LEFT HAND  Final   Special Requests IN PEDIATRIC BOTTLE Blood Culture adequate volume  Final   Culture NO GROWTH 2 DAYS  Final   Report Status PENDING  Incomplete  Culture, blood (routine x 2)     Status: None (Preliminary result)   Collection Time: 12/25/16  2:35 AM  Result Value Ref Range Status   Specimen Description BLOOD LEFT WRIST  Final   Special Requests IN PEDIATRIC BOTTLE Blood Culture adequate volume  Final   Culture NO GROWTH 2 DAYS  Final   Report Status PENDING  Incomplete         Radiology Studies: Dg Chest Port 1 View  Result Date: 12/27/2016 CLINICAL DATA:  Pleural effusion. EXAM: PORTABLE CHEST 1 VIEW COMPARISON:  Most recent chest radiograph yesterday. FINDINGS: Enteric tube in place, tip below the diaphragm. Left-sided pacemaker remains in place. Stable cardiomegaly and mediastinal contours. Hazy opacity at the lung bases consistent with pleural effusions and likely underlying atelectasis, worsening prior exam. Worsening vascular congestion. No pneumothorax. IMPRESSION: Worsening hazy opacity at the lung bases consistent with increasing pleural effusions/atelectasis. Worsening vascular congestion. Electronically Signed   By: Jeb Levering M.D.   On: 12/27/2016 17:18        Scheduled Meds: . aspirin  81 mg Per Tube Daily  . chlorhexidine   Topical Once  . chlorhexidine gluconate (MEDLINE KIT)  15 mL Mouth Rinse BID  . enoxaparin (LOVENOX) injection  85 mg Subcutaneous Q12H  .  feeding supplement (VITAL 1.5 CAL)  1,000 mL Per Tube Q24H  . furosemide  20 mg Intravenous Daily  . insulin aspart  0-20 Units Subcutaneous Q4H  . lidocaine (PF)  5 mL Intradermal Once  . mouth rinse  15 mL Mouth Rinse QID  . midodrine  2.5 mg Oral TID WC  . pantoprazole (PROTONIX) IV  40 mg Intravenous Q12H  . sodium chloride flush  5 mL Intravenous Q8H   Continuous Infusions: . sodium chloride Stopped (12/14/16 0953)  . sodium chloride 10 mL/hr at 12/27/16 0040  . DAPTOmycin (CUBICIN)  IV Stopped (12/27/16 1705)  . piperacillin-tazobactam (ZOSYN)  IV Stopped (12/28/16 0549)     LOS: 31 days     Cordelia Poche, MD [facility] 12/28/2016, 9:35 AM Pager: [phone]  If 7PM-7AM, please contact night-coverage [email or web address] Password [email or web address] 12/28/2016, 9:35 AM

## 2016-12-28 NOTE — Progress Notes (Signed)
Call made to DR [facility] - made aware of pt's last CBG was 78 - This am Surgery consult D/C'd TF - Pt on NS at 10 cc/hr only - MD preferred not to run any other maintenance fluid - if CBG becomes too low just follow protocol.

## 2016-12-28 NOTE — Progress Notes (Addendum)
Upon start of shift (12/27/16 1900) pt found to be very sedated, blinking to his name being called, not responding to sternal rub. As shift progressed, pt improved in level of alertness as well as mentation. At this time, pt is as alert as I have seen him in two overnight shifts.   Pt seems to experience a moderate amount of pain during midline dehiscence dressing changes, but verbally denies the need for intervention.  0865 - pt noted to be in Cheyne-Stokes respiratory pattern with one instance of desaturation to the high 80s and occasional tachycardia. 2L O2 applied via nasal cannula.

## 2016-12-29 ENCOUNTER — Inpatient Hospital Stay (HOSPITAL_COMMUNITY): Payer: Medicare PPO

## 2016-12-29 DIAGNOSIS — R7881 Bacteremia: Secondary | ICD-10-CM

## 2016-12-29 DIAGNOSIS — B957 Other staphylococcus as the cause of diseases classified elsewhere: Secondary | ICD-10-CM

## 2016-12-29 LAB — BASIC METABOLIC PANEL
Anion gap: 5 (ref 5–15)
BUN: 25 mg/dL — AB (ref 6–20)
CHLORIDE: 118 mmol/L — AB (ref 101–111)
CO2: 25 mmol/L (ref 22–32)
Calcium: 7.8 mg/dL — ABNORMAL LOW (ref 8.9–10.3)
Creatinine, Ser: 0.85 mg/dL (ref 0.61–1.24)
Glucose, Bld: 78 mg/dL (ref 65–99)
POTASSIUM: 3.7 mmol/L (ref 3.5–5.1)
SODIUM: 148 mmol/L — AB (ref 135–145)

## 2016-12-29 LAB — GLUCOSE, CAPILLARY
GLUCOSE-CAPILLARY: 75 mg/dL (ref 65–99)
GLUCOSE-CAPILLARY: 61 mg/dL — AB (ref 65–99)
GLUCOSE-CAPILLARY: 97 mg/dL (ref 65–99)
GLUCOSE-CAPILLARY: 67 mg/dL (ref 65–99)
GLUCOSE-CAPILLARY: 89 mg/dL (ref 65–99)
GLUCOSE-CAPILLARY: 76 mg/dL (ref 65–99)
Glucose-Capillary: 69 mg/dL (ref 65–99)

## 2016-12-29 MED ORDER — DEXTROSE 50 % IV SOLN
INTRAVENOUS | Status: AC
  Administered 2016-12-29: 25 mL
  Filled 2016-12-29: qty 50

## 2016-12-29 MED ORDER — IOPAMIDOL (ISOVUE-300) INJECTION 61%
INTRAVENOUS | Status: AC
  Administered 2016-12-29: 30 mL
  Filled 2016-12-29: qty 30

## 2016-12-29 MED ORDER — IOPAMIDOL (ISOVUE-300) INJECTION 61%
INTRAVENOUS | Status: AC
Start: 2016-12-29 — End: 2016-12-29
  Administered 2016-12-29: 100 mL
  Filled 2016-12-29: qty 100

## 2016-12-29 NOTE — Progress Notes (Signed)
29 Days Post-Op   Subjective/Chief Complaint: no complaint  Pt without complaint    Objective: Vital signs in last 24 hours: Temp:  [98 F (36.7 C)-99 F (37.2 C)] 98.4 F (36.9 C) (09/11 0733) Pulse Rate:  [43-84] 72 (09/11 0733) Resp:  [19-40] 27 (09/11 0733) BP: (101-142)/(31-90) 113/49 (09/11 0733) SpO2:  [92 %-100 %] 100 % (09/11 0733) Weight:  [82.5 kg (181 lb 14.1 oz)] 82.5 kg (181 lb 14.1 oz) (09/11 0300) Last BM Date: 12/24/16  Intake/Output from previous day: 09/10 0701 - 09/11 0700 In: 280 [I.V.:120; IV Piggyback:160] Out: 1350 [Urine:1350] Intake/Output this shift: No intake/output data recorded.  Incision/Wound:stool leaking out of inferior portion of the wound   Open with fascia intact but necrotic   Drain serosanguinous   Lab Results:   Recent Labs  12/26/16 0932 12/28/16 0534  WBC 6.3 6.9  HGB 8.2* 9.0*  HCT 27.4* 30.2*  PLT 284 343   BMET  Recent Labs  12/26/16 0932 12/28/16 0534  NA 148* 147*  K 3.6 3.1*  CL 117* 116*  CO2 20* 23  GLUCOSE 121* 110*  BUN 30* 22*  CREATININE 0.96 0.82  CALCIUM 7.5* 7.7*   PT/INR No results for input(s): LABPROT, INR in the last 72 hours. ABG  Recent Labs  12/28/16 0948  PHART 7.459*  HCO3 24.6    Studies/Results: Dg Chest Port 1 View  Result Date: 12/27/2016 CLINICAL DATA:  Pleural effusion. EXAM: PORTABLE CHEST 1 VIEW COMPARISON:  Most recent chest radiograph yesterday. FINDINGS: Enteric tube in place, tip below the diaphragm. Left-sided pacemaker remains in place. Stable cardiomegaly and mediastinal contours. Hazy opacity at the lung bases consistent with pleural effusions and likely underlying atelectasis, worsening prior exam. Worsening vascular congestion. No pneumothorax. IMPRESSION: Worsening hazy opacity at the lung bases consistent with increasing pleural effusions/atelectasis. Worsening vascular congestion. Electronically Signed   By: Rubye Oaks M.D.   On: 12/27/2016 17:18     Anti-infectives: Anti-infectives    Start     Dose/Rate Route Frequency Ordered Stop   12/25/16 1600  DAPTOmycin (CUBICIN) 500 mg in sodium chloride 0.9 % IVPB     500 mg 220 mL/hr over 30 Minutes Intravenous Every 24 hours 12/25/16 1002     12/25/16 1300  DAPTOmycin (CUBICIN) 500 mg in sodium chloride 0.9 % IVPB  Status:  Discontinued     500 mg 220 mL/hr over 30 Minutes Intravenous Every 24 hours 12/25/16 0956 12/25/16 1002   12/23/16 1800  vancomycin (VANCOCIN) IVPB 750 mg/150 ml premix  Status:  Discontinued     750 mg 150 mL/hr over 60 Minutes Intravenous Every 12 hours 12/23/16 0310 12/25/16 0941   12/23/16 0145  vancomycin (VANCOCIN) 1,750 mg in sodium chloride 0.9 % 500 mL IVPB     1,750 mg 250 mL/hr over 120 Minutes Intravenous  Once 12/23/16 0138 12/23/16 0353   12/21/16 1500  piperacillin-tazobactam (ZOSYN) IVPB 3.375 g     3.375 g 12.5 mL/hr over 240 Minutes Intravenous Every 8 hours 12/21/16 1456     12/13/16 1000  amoxicillin (AMOXIL) 250 MG/5ML suspension 1,000 mg     1,000 mg Per Tube Every 12 hours 12/13/16 0935 12/16/16 1833   12/12/16 1900  piperacillin-tazobactam (ZOSYN) IVPB 3.375 g  Status:  Discontinued     3.375 g 12.5 mL/hr over 240 Minutes Intravenous Every 8 hours 12/12/16 1759 12/13/16 0911   12/12/16 1400  Ampicillin-Sulbactam (UNASYN) 3 g in sodium chloride 0.9 % 100 mL IVPB  Status:  Discontinued     3 g 200 mL/hr over 30 Minutes Intravenous Every 6 hours 12/12/16 1215 12/12/16 1753   12/10/16 0600  amoxicillin (AMOXIL) 250 MG/5ML suspension 1,000 mg  Status:  Discontinued     1,000 mg Per Tube Every 12 hours 12/09/16 1701 12/12/16 1206   12/09/16 1130  amoxicillin (AMOXIL) 250 MG/5ML suspension 1,000 mg  Status:  Discontinued     1,000 mg Oral Every 12 hours 12/09/16 1117 12/09/16 1118   12/09/16 1130  amoxicillin (AMOXIL) 250 MG/5ML suspension 1,000 mg  Status:  Discontinued     1,000 mg Per Tube Every 12 hours 12/09/16 1118 12/09/16 1701    12/04/16 2200  clarithromycin (BIAXIN) tablet 500 mg     500 mg Per Tube Every 12 hours 12/04/16 1107 12/16/16 2158   12/04/16 2000  piperacillin-tazobactam (ZOSYN) IVPB 3.375 g     3.375 g 12.5 mL/hr over 240 Minutes Intravenous Every 8 hours 12/04/16 1339 12/08/16 2359   12/04/16 1600  metroNIDAZOLE (FLAGYL) tablet 500 mg  Status:  Discontinued     500 mg Per Tube Every 8 hours 12/04/16 1107 12/09/16 1117   12/02/16 1100  clarithromycin (BIAXIN) 250 MG/5ML suspension 500 mg  Status:  Discontinued     500 mg Per Tube Every 12 hours 12/02/16 1012 12/04/16 1107   12/02/16 1045  metroNIDAZOLE (FLAGYL) 50 mg/ml oral suspension 500 mg  Status:  Discontinued     500 mg Per Tube 3 times daily 12/02/16 1036 12/04/16 1107   12/02/16 1015  metroNIDAZOLE (FLAGYL) 50 mg/ml oral suspension 250 mg  Status:  Discontinued     250 mg Per Tube 4 times daily 12/02/16 1011 12/02/16 1036   11/28/16 0800  fluconazole (DIFLUCAN) IVPB 200 mg  Status:  Discontinued     200 mg 100 mL/hr over 60 Minutes Intravenous Every 24 hours 11/27/16 0624 12/02/16 1018   11/27/16 0630  fluconazole (DIFLUCAN) IVPB 400 mg     400 mg 100 mL/hr over 120 Minutes Intravenous  Once 11/27/16 0622 11/27/16 0841   11/27/16 0600  piperacillin-tazobactam (ZOSYN) IVPB 3.375 g  Status:  Discontinued     3.375 g 12.5 mL/hr over 240 Minutes Intravenous Every 8 hours 11/27/16 0534 12/04/16 1339   11/27/16 0515  piperacillin-tazobactam (ZOSYN) IVPB 3.375 g  Status:  Discontinued     3.375 g 100 mL/hr over 30 Minutes Intravenous  Once 11/27/16 0510 11/27/16 0518   11/27/16 0030  piperacillin-tazobactam (ZOSYN) IVPB 3.375 g     3.375 g 100 mL/hr over 30 Minutes Intravenous  Once 11/27/16 0016 11/27/16 0755   11/27/16 0030  vancomycin (VANCOCIN) IVPB 1000 mg/200 mL premix     1,000 mg 200 mL/hr over 60 Minutes Intravenous  Once 11/27/16 0016 11/27/16 0756      Assessment/Plan:   Perforated pyloric ulcer  S/P EXPLORATORY LAPAROTOMY,  [place] PATCH REPAIR8/10/18 Dr. 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 Darrick Penna CVA - on lovenox and aspirin, xarelto? Malnutrition - prealbumin 8.2 (9/7) Hypernatremia/hyperchloremia Dysphagia DM Acute renal failure - improved, Cr 0.82  FEN:  IVF, NPO, hold TF ID:  zosyn 8/9- 12/08/16, Diflucan 8/9-8/15/18, clarithromycin 8/17 - 8/29,Amoxicillin 8/23-29/18 =>>day 15 of antibiotics  Vancomycin restarted 9/5>>9/7, Zosyn restarted 9/3>>,Daptomycin 9/7>> DVT: lovenox  Plan: Wound now draining enteric contents. Hold tube feeds.Recommend PICC/TNA.    CT today   Recommend palliative care   Not an operative candidate in present condition    LOS: 32 days    Vasily Fedewa A. 12/29/2016

## 2016-12-29 NOTE — Progress Notes (Signed)
INFECTIOUS DISEASE PROGRESS NOTE  ID: Jacob Pittman is a 71 y.o. male with  Principal Problem:   Perforated duodenal bulb ulcer ([place]) Active Problems:   Acute on chronic combined systolic and diastolic CHF (congestive heart failure) (HCC)   Nonischemic cardiomyopathy (HCC)   Pneumoperitoneum   Acute respiratory failure with hypoxia (HCC)   Paroxysmal A-fib (HCC)   CVA (cerebral vascular accident) ([place])   Arterial thrombosis (HCC)   H pylori ulcer   Fatty infiltration of liver   DM (diabetes mellitus), type 2 (HCC)   Pressure sore on heel, left, unstageable (HCC)   Poor venous access   Palliative care encounter   Goals of care, counseling/discussion   Bowel perforation ([place])   Sepsis ([place])  Subjective: No complaints.   Abtx:  Anti-infectives    Start     Dose/Rate Route Frequency Ordered Stop   12/25/16 1600  DAPTOmycin (CUBICIN) 500 mg in sodium chloride 0.9 % IVPB     500 mg 220 mL/hr over 30 Minutes Intravenous Every 24 hours 12/25/16 1002     12/25/16 1300  DAPTOmycin (CUBICIN) 500 mg in sodium chloride 0.9 % IVPB  Status:  Discontinued     500 mg 220 mL/hr over 30 Minutes Intravenous Every 24 hours 12/25/16 0956 12/25/16 1002   12/23/16 1800  vancomycin (VANCOCIN) IVPB 750 mg/150 ml premix  Status:  Discontinued     750 mg 150 mL/hr over 60 Minutes Intravenous Every 12 hours 12/23/16 0310 12/25/16 0941   12/23/16 0145  vancomycin (VANCOCIN) 1,750 mg in sodium chloride 0.9 % 500 mL IVPB     1,750 mg 250 mL/hr over 120 Minutes Intravenous  Once 12/23/16 0138 12/23/16 0353   12/21/16 1500  piperacillin-tazobactam (ZOSYN) IVPB 3.375 g     3.375 g 12.5 mL/hr over 240 Minutes Intravenous Every 8 hours 12/21/16 1456     12/13/16 1000  amoxicillin (AMOXIL) 250 MG/5ML suspension 1,000 mg     1,000 mg Per Tube Every 12 hours 12/13/16 0935 12/16/16 1833   12/12/16 1900  piperacillin-tazobactam (ZOSYN) IVPB 3.375 g  Status:  Discontinued     3.375 g 12.5 mL/hr over 240  Minutes Intravenous Every 8 hours 12/12/16 1759 12/13/16 0911   12/12/16 1400  Ampicillin-Sulbactam (UNASYN) 3 g in sodium chloride 0.9 % 100 mL IVPB  Status:  Discontinued     3 g 200 mL/hr over 30 Minutes Intravenous Every 6 hours 12/12/16 1215 12/12/16 1753   12/10/16 0600  amoxicillin (AMOXIL) 250 MG/5ML suspension 1,000 mg  Status:  Discontinued     1,000 mg Per Tube Every 12 hours 12/09/16 1701 12/12/16 1206   12/09/16 1130  amoxicillin (AMOXIL) 250 MG/5ML suspension 1,000 mg  Status:  Discontinued     1,000 mg Oral Every 12 hours 12/09/16 1117 12/09/16 1118   12/09/16 1130  amoxicillin (AMOXIL) 250 MG/5ML suspension 1,000 mg  Status:  Discontinued     1,000 mg Per Tube Every 12 hours 12/09/16 1118 12/09/16 1701   12/04/16 2200  clarithromycin (BIAXIN) tablet 500 mg     500 mg Per Tube Every 12 hours 12/04/16 1107 12/16/16 2158   12/04/16 2000  piperacillin-tazobactam (ZOSYN) IVPB 3.375 g     3.375 g 12.5 mL/hr over 240 Minutes Intravenous Every 8 hours 12/04/16 1339 12/08/16 2359   12/04/16 1600  metroNIDAZOLE (FLAGYL) tablet 500 mg  Status:  Discontinued     500 mg Per Tube Every 8 hours 12/04/16 1107 12/09/16 1117   12/02/16 1100  clarithromycin (BIAXIN) 250 MG/5ML suspension 500 mg  Status:  Discontinued     500 mg Per Tube Every 12 hours 12/02/16 1012 12/04/16 1107   12/02/16 1045  metroNIDAZOLE (FLAGYL) 50 mg/ml oral suspension 500 mg  Status:  Discontinued     500 mg Per Tube 3 times daily 12/02/16 1036 12/04/16 1107   12/02/16 1015  metroNIDAZOLE (FLAGYL) 50 mg/ml oral suspension 250 mg  Status:  Discontinued     250 mg Per Tube 4 times daily 12/02/16 1011 12/02/16 1036   11/28/16 0800  fluconazole (DIFLUCAN) IVPB 200 mg  Status:  Discontinued     200 mg 100 mL/hr over 60 Minutes Intravenous Every 24 hours 11/27/16 0624 12/02/16 1018   11/27/16 0630  fluconazole (DIFLUCAN) IVPB 400 mg     400 mg 100 mL/hr over 120 Minutes Intravenous  Once 11/27/16 0622 11/27/16 0841    11/27/16 0600  piperacillin-tazobactam (ZOSYN) IVPB 3.375 g  Status:  Discontinued     3.375 g 12.5 mL/hr over 240 Minutes Intravenous Every 8 hours 11/27/16 0534 12/04/16 1339   11/27/16 0515  piperacillin-tazobactam (ZOSYN) IVPB 3.375 g  Status:  Discontinued     3.375 g 100 mL/hr over 30 Minutes Intravenous  Once 11/27/16 0510 11/27/16 0518   11/27/16 0030  piperacillin-tazobactam (ZOSYN) IVPB 3.375 g     3.375 g 100 mL/hr over 30 Minutes Intravenous  Once 11/27/16 0016 11/27/16 0755   11/27/16 0030  vancomycin (VANCOCIN) IVPB 1000 mg/200 mL premix     1,000 mg 200 mL/hr over 60 Minutes Intravenous  Once 11/27/16 0016 11/27/16 0756      Medications:  Scheduled: . iopamidol      . aspirin  81 mg Per Tube Daily  . chlorhexidine   Topical Once  . chlorhexidine gluconate (MEDLINE KIT)  15 mL Mouth Rinse BID  . enoxaparin (LOVENOX) injection  85 mg Subcutaneous Q12H  . furosemide  20 mg Intravenous Daily  . insulin aspart  0-20 Units Subcutaneous Q4H  . lidocaine (PF)  5 mL Intradermal Once  . mouth rinse  15 mL Mouth Rinse QID  . midodrine  2.5 mg Oral TID WC  . pantoprazole (PROTONIX) IV  40 mg Intravenous Q12H  . sodium chloride flush  5 mL Intravenous Q8H    Objective: Vital signs in last 24 hours: Temp:  [98 F (36.7 C)-99 F (37.2 C)] 98.4 F (36.9 C) (09/11 0733) Pulse Rate:  [62-82] 72 (09/11 0733) Resp:  [19-32] 27 (09/11 0733) BP: (101-142)/(31-90) 113/49 (09/11 0733) SpO2:  [97 %-100 %] 100 % (09/11 0733) Weight:  [82.5 kg (181 lb 14.1 oz)] 82.5 kg (181 lb 14.1 oz) (09/11 0300)   General appearance: alert, cooperative and no distress Resp: clear to auscultation bilaterally Cardio: regular rate and rhythm GI: normal findings: bowel sounds normal and soft, non-tender Extremities: edema none BLE, boots on feet.   Lab Results  Recent Labs  12/28/16 0534 12/29/16 0908  WBC 6.9  --   HGB 9.0*  --   HCT 30.2*  --   NA 147* 148*  K 3.1* 3.7  CL 116* 118*    CO2 23 25  BUN 22* 25*  CREATININE 0.82 0.85   Liver Panel  Recent Labs  12/28/16 0534  PROT 5.7*  ALBUMIN 2.0*  AST 39  ALT 49  ALKPHOS 92  BILITOT 0.6   Sedimentation Rate No results for input(s): ESRSEDRATE in the last 72 hours. C-Reactive Protein No results for input(s): CRP  in the last 72 hours.  Microbiology: Recent Results (from the past 240 hour(s))  Culture, blood (Routine X 2) w Reflex to ID Panel     Status: Abnormal   Collection Time: 12/21/16  6:32 PM  Result Value Ref Range Status   Specimen Description BLOOD LEFT HAND  Final   Special Requests   Final    BOTTLES DRAWN AEROBIC ONLY Blood Culture adequate volume   Culture  Setup Time   Final    GRAM POSITIVE COCCI IN CLUSTERS AEROBIC BOTTLE ONLY CRITICAL RESULT CALLED TO, READ BACK BY AND VERIFIED WITH: K COOK PHARMD [id number] 12/22/16 A BROWNING    Culture (A)  Final    STAPHYLOCOCCUS EPIDERMIDIS SUSCEPTIBILITIES PERFORMED ON PREVIOUS CULTURE WITHIN THE LAST 5 DAYS.    Report Status 12/25/2016 FINAL  Final  Blood Culture ID Panel (Reflexed)     Status: Abnormal   Collection Time: 12/21/16  6:32 PM  Result Value Ref Range Status   Enterococcus species NOT DETECTED NOT DETECTED Final   Listeria monocytogenes NOT DETECTED NOT DETECTED Final   Staphylococcus species DETECTED (A) NOT DETECTED Final    Comment: Methicillin (oxacillin) resistant coagulase negative staphylococcus. Possible blood culture contaminant (unless isolated from more than one blood culture draw or clinical case suggests pathogenicity). No antibiotic treatment is indicated for blood  culture contaminants. CRITICAL RESULT CALLED TO, READ BACK BY AND VERIFIED WITH: K COOK PHARMD [id number] 12/22/16 A BROWNING    Staphylococcus aureus NOT DETECTED NOT DETECTED Final   Methicillin resistance DETECTED (A) NOT DETECTED Final    Comment: CRITICAL RESULT CALLED TO, READ BACK BY AND VERIFIED WITH: K COOK PHARMD [id number] 12/22/16 A BROWNING    Streptococcus  species NOT DETECTED NOT DETECTED Final   Streptococcus agalactiae NOT DETECTED NOT DETECTED Final   Streptococcus pneumoniae NOT DETECTED NOT DETECTED Final   Streptococcus pyogenes NOT DETECTED NOT DETECTED Final   Acinetobacter baumannii NOT DETECTED NOT DETECTED Final   Enterobacteriaceae species NOT DETECTED NOT DETECTED Final   Enterobacter cloacae complex NOT DETECTED NOT DETECTED Final   Escherichia coli NOT DETECTED NOT DETECTED Final   Klebsiella oxytoca NOT DETECTED NOT DETECTED Final   Klebsiella pneumoniae NOT DETECTED NOT DETECTED Final   Proteus species NOT DETECTED NOT DETECTED Final   Serratia marcescens NOT DETECTED NOT DETECTED Final   Haemophilus influenzae NOT DETECTED NOT DETECTED Final   Neisseria meningitidis NOT DETECTED NOT DETECTED Final   Pseudomonas aeruginosa NOT DETECTED NOT DETECTED Final   Candida albicans NOT DETECTED NOT DETECTED Final   Candida glabrata NOT DETECTED NOT DETECTED Final   Candida krusei NOT DETECTED NOT DETECTED Final   Candida parapsilosis NOT DETECTED NOT DETECTED Final   Candida tropicalis NOT DETECTED NOT DETECTED Final  Culture, blood (Routine X 2) w Reflex to ID Panel     Status: Abnormal   Collection Time: 12/21/16  6:38 PM  Result Value Ref Range Status   Specimen Description BLOOD RIGHT ARM  Final   Special Requests IN PEDIATRIC BOTTLE Blood Culture adequate volume  Final   Culture  Setup Time   Final    GRAM POSITIVE COCCI IN CLUSTERS IN PEDIATRIC BOTTLE CRITICAL VALUE NOTED.  VALUE IS CONSISTENT WITH PREVIOUSLY REPORTED AND CALLED VALUE.    Culture STAPHYLOCOCCUS EPIDERMIDIS (A)  Final   Report Status 12/25/2016 FINAL  Final   Organism ID, Bacteria STAPHYLOCOCCUS EPIDERMIDIS  Final      Susceptibility   Staphylococcus epidermidis - MIC*    CIPROFLOXACIN >=8  RESISTANT Resistant     ERYTHROMYCIN >=8 RESISTANT Resistant     GENTAMICIN 8 INTERMEDIATE Intermediate     OXACILLIN RESISTANT Resistant     TETRACYCLINE 2  SENSITIVE Sensitive     VANCOMYCIN 4 SENSITIVE Sensitive     TRIMETH/SULFA 160 RESISTANT Resistant     CLINDAMYCIN >=8 RESISTANT Resistant     RIFAMPIN <=0.5 SENSITIVE Sensitive     Inducible Clindamycin NEGATIVE Sensitive     * STAPHYLOCOCCUS EPIDERMIDIS  Surgical pcr screen     Status: None   Collection Time: 12/21/16  9:33 PM  Result Value Ref Range Status   MRSA, PCR NEGATIVE NEGATIVE Final   Staphylococcus aureus NEGATIVE NEGATIVE Final    Comment: (NOTE) The Xpert SA Assay (FDA approved for NASAL specimens in patients 47 years of age and older), is one component of a comprehensive surveillance program. It is not intended to diagnose infection nor to guide or monitor treatment.   Body fluid culture     Status: Abnormal   Collection Time: 12/24/16 12:13 PM  Result Value Ref Range Status   Specimen Description FLUID  Final   Special Requests ASCITIES  Final   Gram Stain   Final    RARE WBC PRESENT,BOTH PMN AND MONONUCLEAR NO ORGANISMS SEEN    Culture (A)  Final    MULTIPLE ORGANISMS PRESENT, NONE PREDOMINANT CRITICAL RESULT CALLED TO, READ BACK BY AND VERIFIED WITH: A. DAVIS RN, AT 1429 12/25/16 BY D. VANHOOK REGARDING CULTURE GROWTH    Report Status 12/26/2016 FINAL  Final  Culture, blood (routine x 2)     Status: None (Preliminary result)   Collection Time: 12/25/16  2:21 AM  Result Value Ref Range Status   Specimen Description BLOOD LEFT HAND  Final   Special Requests IN PEDIATRIC BOTTLE Blood Culture adequate volume  Final   Culture NO GROWTH 4 DAYS  Final   Report Status PENDING  Incomplete  Culture, blood (routine x 2)     Status: None (Preliminary result)   Collection Time: 12/25/16  2:35 AM  Result Value Ref Range Status   Specimen Description BLOOD LEFT WRIST  Final   Special Requests IN PEDIATRIC BOTTLE Blood Culture adequate volume  Final   Culture NO GROWTH 4 DAYS  Final   Report Status PENDING  Incomplete    Studies/Results: Dg Chest Port 1  View  Result Date: 12/27/2016 CLINICAL DATA:  Pleural effusion. EXAM: PORTABLE CHEST 1 VIEW COMPARISON:  Most recent chest radiograph yesterday. FINDINGS: Enteric tube in place, tip below the diaphragm. Left-sided pacemaker remains in place. Stable cardiomegaly and mediastinal contours. Hazy opacity at the lung bases consistent with pleural effusions and likely underlying atelectasis, worsening prior exam. Worsening vascular congestion. No pneumothorax. IMPRESSION: Worsening hazy opacity at the lung bases consistent with increasing pleural effusions/atelectasis. Worsening vascular congestion. Electronically Signed   By: Jeb Levering M.D.   On: 12/27/2016 17:18     Assessment/Plan: MRSE bacteremia MIC elevated (4) CK fine Central line out 9-6 Check TTE does not specifically mention endocarditis. Again, given his overall co-morbidities, would not check TEE.  Repeat BCx sent 9-7- negative 4 days, having difficulty getting PIC.  Day 6 of anti-MRSE anbx. Would aim for 14 days.   Perforated gastric ulcer Intra-abdominal abscess Cx- poly-microbial. No change in anbx.  Stool draining from wound, defer to surgery.   ICD  Total days of antibiotics:  dapto 9-7 Zosyn 9-3   Available as needed.  Bobby Rumpf Infectious Diseases (pager) [phone] [email or web address] 12/29/2016, 11:28 AM  LOS: 32 days

## 2016-12-29 NOTE — Progress Notes (Signed)
Spoke with primary RN about PICC line placement ,IV team had attempted to place PICC on left side without success due to left sided AICD . Right arm is restricted due to thrombectomy. RN to notify MD.

## 2016-12-29 NOTE — Care Management Important Message (Signed)
Important Message  Patient Details  Name: Jacob Pittman MRN: [id number] Date of Birth: 1946-02-18   Medicare Important Message Given:  Yes    [name] 12/29/2016, 9:00 AM

## 2016-12-29 NOTE — Progress Notes (Signed)
PROGRESS NOTE    Jacob Pittman  FCF:[id number] DOB: [birth year] DOA: 11/26/2016 PCP: [name], MD   Brief Narrative: Jacob Pittman is a 71 y.o. 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 Was found to have a perforated pyloric channel ulcer and pneumoperitonitis s/p omental patch repair subsequently on epinephrine and Levophed.  8/12-  TTE LV moderately dilated with EF 15% &diffuse hypokinesis. There is akinesis of the inferolateral and inferior myocardium.  E coli UTI noted 8/13-  right brachial A- line placed and then developed ischemic right arm- vascular consult and transfer from [facility] to [facility]- thrombectomy of brachial artery- heparin Paroxysmal A-fib also noted 8/14- Decreased attenuation involving the right dentate nucleus in the right cerebellum and immediately adjacent cerebellar parenchyma, concerning for recent and potentially acute infarct in this area.  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- anticoagulation recommended to be stopped for 10-14 days - H pyloli + started on triple therapy - Transaminitis thought to be du to Diflucan hepatotoxicity 8/17 - febrile again and vasopressors resumed 8/23- extubated 8/25 A-fib with HR up to 160s, fever 101.6 at 4 AM- care transferred to [facility] 9/3 - stool noted to be coming out of wound   Assessment & Plan:   Principal Problem:   Perforated duodenal bulb ulcer (HCC) Active Problems:   Acute on chronic combined systolic and diastolic CHF (congestive heart failure) (HCC)   Nonischemic cardiomyopathy (HCC)   Pneumoperitoneum   Acute respiratory failure with hypoxia (HCC)   Paroxysmal A-fib (HCC)   CVA (cerebral vascular accident) (HCC)   Arterial thrombosis (HCC)   H pylori ulcer   Fatty infiltration of liver   DM (diabetes mellitus), type 2 (HCC)   Pressure sore on heel, left, unstageable (HCC)   Poor venous  access   Palliative care encounter   Goals of care, counseling/discussion   Bowel perforation (HCC)   Sepsis (HCC)   Perforated duodenal bulb ulcer Peritonitis H. Pylori ulcer Septic shock Abdominal Abscess Patient has had a prolonged hospital course secondary to above problems. Patient has received multiple antibiotic therapies. He had evidence of a abscess tracking along sigmoid colon mesentery/omentum with drainage through the wound, evidence of wound necrosis. S/p drain placement with cultures significant for multiple organisms. Drain not outputting much -General surgery recommendations: discontinue tube feeds secondary to wound draining enteric contents; CT scan today -Palliative care recommendations -new goals of care discussion as patient will need TPN and not sure if this would be in his best interest given previous goals -Interventional radiology recommendations -continue midodrine  Methicillin resistant staph species bacteremia Seen on 9/3 blood cultures x2. IJ removed 12/24/16 -infectious disease recommendations: daptomycin -repeat blood cultures obtained 12/25/16 and are pending (no growth x2 days)  Tachypnea Pulmonary edema on imaging. Improved with lasix. Stable.  Acute on chronic systolic and diastolic heart failure Repeat echo significant for improved EF of 25-30% from 15% with grade 2 diastolic dysfunction. Weight is down after lasix IV. -continue lasix 20mg  IV daily as blood pressure has improved -strict in/out -daily weights -daily BMP while on IV lasix  Paroxysmal atrial fibrillation -continue metoprolol  Bilateral basilar atelectasis -continue to encourage incentive spirometry  Hypernatremia Improved slightly. Will likely continue to improve with lasix  Acute respiratory failure with hypoxia Previously intubated and successfully extubated. Secondary to sepsis. Oxygen saturations dropped to 80s overnight. Started on [place] oxygen. Abnormal breathing pattern per  nurse report overnight. -ABG -continue oxygen  Hypokalemia -Supplement with potassium as needed  CVA Likely embolic from atrial fibrillation -continue Xarelto -continue aspirin  Arterial thrombosis Right brachial artery. Secondary to A-line. S/p thrombectomy  Bilateral arm edema No DVT on ultrasound -Lasix as above  Fatty liver  Diabetes mellitus, type 2 A1c of 5.8% -continue SSI  Pacer firing Read as asystole which does not appear to be the case. No arrhythmia noted. -cardiology for device interrogation.  Cognitive impairment Patient has been consistently oriented to person with me. No history of dementia. ?s/p CVA.   DVT prophylaxis: Xarelto Code Status: DNR Family Communication: None at bedside. Called daughter without answer Disposition Plan: Discharge pending goals of care   Consultants:   General surgery  Interventional radiology  Palliative care medicine  Procedures:   8/10- ex lap- patch repair of pyloric ucler  8/13- brachial embolectomy  8/12- EF 15%, diffuse hypokinesis and akinesis of inferolateral/inferioir myocardium  9/6- Percutaneous drain  9/7- Echocardiogram: EF of 40-98%, grade 2 diastolic dysfunction. No evidence of vegetations   Antimicrobials:  Amoxicillin  Unasyn  Clarithromycin  Metronidazole  Zosyn  Vancomycin  Daptomycin    Subjective: No pain  Objective: Vitals:   12/29/16 0000 12/29/16 0300 12/29/16 0400 12/29/16 0733  BP: (!) 123/45 (!) 108/45 (!) 101/54 (!) 113/49  Pulse: 71 65 62 72  Resp: (!) 24 (!) 24 (!) 23 (!) 27  Temp:  98.6 F (37 C)  98.4 F (36.9 C)  TempSrc:  Oral  Oral  SpO2: 100% 100% 100% 100%  Weight:  82.5 kg (181 lb 14.1 oz)    Height:        Intake/Output Summary (Last 24 hours) at 12/29/16 0932 Last data filed at 12/29/16 0600  Gross per 24 hour  Intake              280 ml  Output             1350 ml  Net            -1070 ml   Filed Weights   12/27/16 0354 12/28/16  0407 12/29/16 0300  Weight: 84.7 kg (186 lb 11.7 oz) 83.2 kg (183 lb 6.8 oz) 82.5 kg (181 lb 14.1 oz)    Examination:  General exam: Appears calm and comfortable Respiratory system: Clear to auscultation with decreased breath sounds bilaterally. Respiratory effort normal. Cardiovascular system: S1 & S2 heard, RRR. 2/6 systolic murmur. Gastrointestinal system: Abdomen is nondistended, soft and nontender. Abdominal binder in place. Normal bowel sounds heard. Serous drainage in bulb. Foul odor from abdominal wound Central nervous system: Alert, oriented to person. Extremities: Bilateral upper extremity edema. No calf tenderness Skin: No cyanosis. No rashes Psychiatry: Judgement and insight appear impaired. Mood & affect depressed and flat.     Data Reviewed: I have personally reviewed following labs and imaging studies  CBC:  Recent Labs Lab 12/23/16 0409 12/24/16 0448 12/25/16 0221 12/26/16 0932 12/28/16 0534  WBC 7.4 7.9 9.8 6.3 6.9  NEUTROABS  --   --  8.2*  --   --   HGB 8.6* 8.5* 7.3* 8.2* 9.0*  HCT 28.1* 27.2* 23.5* 27.4* 30.2*  MCV 106.4* 106.7* 105.9* 108.3* 107.1*  PLT 291 304 356 284 119   Basic Metabolic Panel:  Recent Labs Lab 12/23/16 0409 12/24/16 0956 12/25/16 0221 12/26/16 0932 12/28/16 0534  NA 146* 146* 145 148* 147*  K 4.1 3.8 3.5 3.6 3.1*  CL 113* 113* 112* 117* 116*  CO2 '23 23 22 '$ 20* 23  GLUCOSE 135* 91 90 121* 110*  BUN 36* 50* 46* 30* 22*  CREATININE 1.31* 1.52* 1.35* 0.96 0.82  CALCIUM 7.4* 7.3* 7.4* 7.5* 7.7*  MG  --   --  2.3  --   --   PHOS 4.4  --  3.7  --   --    GFR: Estimated Creatinine Clearance: 90.7 mL/min (by C-G formula based on SCr of 0.82 mg/dL). Liver Function Tests:  Recent Labs Lab 12/23/16 0409 12/25/16 0221 12/28/16 0534  AST 161* 70* 39  ALT 142* 99* 49  ALKPHOS 143* 130* 92  BILITOT 0.9 0.9 0.6  PROT 6.2* 6.2* 5.7*  ALBUMIN 1.7* 1.8* 2.0*   No results for input(s): LIPASE, AMYLASE in the last 168  hours. No results for input(s): AMMONIA in the last 168 hours. Coagulation Profile:  Recent Labs Lab 12/23/16 0409  INR 1.28   Cardiac Enzymes:  Recent Labs Lab 12/25/16 1020  CKTOTAL 48*   BNP (last 3 results) No results for input(s): PROBNP in the last 8760 hours. HbA1C: No results for input(s): HGBA1C in the last 72 hours. CBG:  Recent Labs Lab 12/28/16 1550 12/28/16 1612 12/28/16 2008 12/28/16 2318 12/29/16 0422  GLUCAP 88 78 81 75 75   Lipid Profile: No results for input(s): CHOL, HDL, LDLCALC, TRIG, CHOLHDL, LDLDIRECT in the last 72 hours. Thyroid Function Tests: No results for input(s): TSH, T4TOTAL, FREET4, T3FREE, THYROIDAB in the last 72 hours. Anemia Panel: No results for input(s): VITAMINB12, FOLATE, FERRITIN, TIBC, IRON, RETICCTPCT in the last 72 hours. Sepsis Labs: No results for input(s): PROCALCITON, LATICACIDVEN in the last 168 hours.  Recent Results (from the past 240 hour(s))  Culture, blood (Routine X 2) w Reflex to ID Panel     Status: Abnormal   Collection Time: 12/21/16  6:32 PM  Result Value Ref Range Status   Specimen Description BLOOD LEFT HAND  Final   Special Requests   Final    BOTTLES DRAWN AEROBIC ONLY Blood Culture adequate volume   Culture  Setup Time   Final    GRAM POSITIVE COCCI IN CLUSTERS AEROBIC BOTTLE ONLY CRITICAL RESULT CALLED TO, READ BACK BY AND VERIFIED WITH: K COOK PHARMD 1975 12/22/16 A BROWNING    Culture (A)  Final    STAPHYLOCOCCUS EPIDERMIDIS SUSCEPTIBILITIES PERFORMED ON PREVIOUS CULTURE WITHIN THE LAST 5 DAYS.    Report Status 12/25/2016 FINAL  Final  Blood Culture ID Panel (Reflexed)     Status: Abnormal   Collection Time: 12/21/16  6:32 PM  Result Value Ref Range Status   Enterococcus species NOT DETECTED NOT DETECTED Final   Listeria monocytogenes NOT DETECTED NOT DETECTED Final   Staphylococcus species DETECTED (A) NOT DETECTED Final    Comment: Methicillin (oxacillin) resistant coagulase negative  staphylococcus. Possible blood culture contaminant (unless isolated from more than one blood culture draw or clinical case suggests pathogenicity). No antibiotic treatment is indicated for blood  culture contaminants. CRITICAL RESULT CALLED TO, READ BACK BY AND VERIFIED WITH: K COOK PHARMD [id number] 12/22/16 A BROWNING    Staphylococcus aureus NOT DETECTED NOT DETECTED Final   Methicillin resistance DETECTED (A) NOT DETECTED Final    Comment: CRITICAL RESULT CALLED TO, READ BACK BY AND VERIFIED WITH: K COOK PHARMD [id number] 12/22/16 A BROWNING    Streptococcus species NOT DETECTED NOT DETECTED Final   Streptococcus agalactiae NOT DETECTED NOT DETECTED Final   Streptococcus pneumoniae NOT DETECTED NOT DETECTED Final   Streptococcus pyogenes  NOT DETECTED NOT DETECTED Final   Acinetobacter baumannii NOT DETECTED NOT DETECTED Final   Enterobacteriaceae species NOT DETECTED NOT DETECTED Final   Enterobacter cloacae complex NOT DETECTED NOT DETECTED Final   Escherichia coli NOT DETECTED NOT DETECTED Final   Klebsiella oxytoca NOT DETECTED NOT DETECTED Final   Klebsiella pneumoniae NOT DETECTED NOT DETECTED Final   Proteus species NOT DETECTED NOT DETECTED Final   Serratia marcescens NOT DETECTED NOT DETECTED Final   Haemophilus influenzae NOT DETECTED NOT DETECTED Final   Neisseria meningitidis NOT DETECTED NOT DETECTED Final   Pseudomonas aeruginosa NOT DETECTED NOT DETECTED Final   Candida albicans NOT DETECTED NOT DETECTED Final   Candida glabrata NOT DETECTED NOT DETECTED Final   Candida krusei NOT DETECTED NOT DETECTED Final   Candida parapsilosis NOT DETECTED NOT DETECTED Final   Candida tropicalis NOT DETECTED NOT DETECTED Final  Culture, blood (Routine X 2) w Reflex to ID Panel     Status: Abnormal   Collection Time: 12/21/16  6:38 PM  Result Value Ref Range Status   Specimen Description BLOOD RIGHT ARM  Final   Special Requests IN PEDIATRIC BOTTLE Blood Culture adequate volume  Final    Culture  Setup Time   Final    GRAM POSITIVE COCCI IN CLUSTERS IN PEDIATRIC BOTTLE CRITICAL VALUE NOTED.  VALUE IS CONSISTENT WITH PREVIOUSLY REPORTED AND CALLED VALUE.    Culture STAPHYLOCOCCUS EPIDERMIDIS (A)  Final   Report Status 12/25/2016 FINAL  Final   Organism ID, Bacteria STAPHYLOCOCCUS EPIDERMIDIS  Final      Susceptibility   Staphylococcus epidermidis - MIC*    CIPROFLOXACIN >=8 RESISTANT Resistant     ERYTHROMYCIN >=8 RESISTANT Resistant     GENTAMICIN 8 INTERMEDIATE Intermediate     OXACILLIN RESISTANT Resistant     TETRACYCLINE 2 SENSITIVE Sensitive     VANCOMYCIN 4 SENSITIVE Sensitive     TRIMETH/SULFA 160 RESISTANT Resistant     CLINDAMYCIN >=8 RESISTANT Resistant     RIFAMPIN <=0.5 SENSITIVE Sensitive     Inducible Clindamycin NEGATIVE Sensitive     * STAPHYLOCOCCUS EPIDERMIDIS  Surgical pcr screen     Status: None   Collection Time: 12/21/16  9:33 PM  Result Value Ref Range Status   MRSA, PCR NEGATIVE NEGATIVE Final   Staphylococcus aureus NEGATIVE NEGATIVE Final    Comment: (NOTE) The Xpert SA Assay (FDA approved for NASAL specimens in patients 29 years of age and older), is one component of a comprehensive surveillance program. It is not intended to diagnose infection nor to guide or monitor treatment.   Body fluid culture     Status: Abnormal   Collection Time: 12/24/16 12:13 PM  Result Value Ref Range Status   Specimen Description FLUID  Final   Special Requests ASCITIES  Final   Gram Stain   Final    RARE WBC PRESENT,BOTH PMN AND MONONUCLEAR NO ORGANISMS SEEN    Culture (A)  Final    MULTIPLE ORGANISMS PRESENT, NONE PREDOMINANT CRITICAL RESULT CALLED TO, READ BACK BY AND VERIFIED WITH: A. DAVIS RN, AT 1429 12/25/16 BY D. VANHOOK REGARDING CULTURE GROWTH    Report Status 12/26/2016 FINAL  Final  Culture, blood (routine x 2)     Status: None (Preliminary result)   Collection Time: 12/25/16  2:21 AM  Result Value Ref Range Status   Specimen  Description BLOOD LEFT HAND  Final   Special Requests IN PEDIATRIC BOTTLE Blood Culture adequate volume  Final   Culture NO GROWTH  3 DAYS  Final   Report Status PENDING  Incomplete  Culture, blood (routine x 2)     Status: None (Preliminary result)   Collection Time: 12/25/16  2:35 AM  Result Value Ref Range Status   Specimen Description BLOOD LEFT WRIST  Final   Special Requests IN PEDIATRIC BOTTLE Blood Culture adequate volume  Final   Culture NO GROWTH 3 DAYS  Final   Report Status PENDING  Incomplete         Radiology Studies: Dg Chest Port 1 View  Result Date: 12/27/2016 CLINICAL DATA:  Pleural effusion. EXAM: PORTABLE CHEST 1 VIEW COMPARISON:  Most recent chest radiograph yesterday. FINDINGS: Enteric tube in place, tip below the diaphragm. Left-sided pacemaker remains in place. Stable cardiomegaly and mediastinal contours. Hazy opacity at the lung bases consistent with pleural effusions and likely underlying atelectasis, worsening prior exam. Worsening vascular congestion. No pneumothorax. IMPRESSION: Worsening hazy opacity at the lung bases consistent with increasing pleural effusions/atelectasis. Worsening vascular congestion. Electronically Signed   By: Jeb Levering M.D.   On: 12/27/2016 17:18        Scheduled Meds: . aspirin  81 mg Per Tube Daily  . chlorhexidine   Topical Once  . chlorhexidine gluconate (MEDLINE KIT)  15 mL Mouth Rinse BID  . enoxaparin (LOVENOX) injection  85 mg Subcutaneous Q12H  . furosemide  20 mg Intravenous Daily  . insulin aspart  0-20 Units Subcutaneous Q4H  . lidocaine (PF)  5 mL Intradermal Once  . mouth rinse  15 mL Mouth Rinse QID  . midodrine  2.5 mg Oral TID WC  . pantoprazole (PROTONIX) IV  40 mg Intravenous Q12H  . sodium chloride flush  5 mL Intravenous Q8H   Continuous Infusions: . sodium chloride Stopped (12/14/16 0953)  . sodium chloride 10 mL/hr at 12/27/16 0040  . DAPTOmycin (CUBICIN)  IV Stopped (12/28/16 1616)  .  piperacillin-tazobactam (ZOSYN)  IV Stopped (12/29/16 9357)     LOS: 18 days     Cordelia Poche, MD [facility] 12/29/2016, 9:32 AM Pager: [phone]  If 7PM-7AM, please contact night-coverage [email or web address] Password [facility] 12/29/2016, 9:32 AM

## 2016-12-29 NOTE — Progress Notes (Signed)
Patient ID: Jacob Pittman, male   DOB: 12-26-1945, 71 y.o.   MRN: [id number]    Referring Physician(s): Dr. Gardenia Phlegm  Supervising Physician: Corrie Mckusick  Patient Status: [facility] - In-pt  Chief Complaint: Intra-abdominal abscess  Subjective: Patient with no new complaints today.  He is draining enteric contents from his abdominal wound.  Allergies: Patient has no known allergies.  Medications: Prior to Admission medications   Medication Sig Start Date End Date Taking? Authorizing Provider  aspirin 81 MG EC tablet Take 81 mg by mouth daily.     Yes [provider]  carvedilol (COREG) 12.5 MG tablet TAKE ONE TABLET BY MOUTH TWICE DAILY 04/27/16  Yes Martinique, Peter M, MD  fluticasone [facility]) 50 MCG/ACT nasal spray Place 2 sprays into both nostrils daily. 09/22/13  Yes Biagio Borg, MD  furosemide (LASIX) 40 MG tablet Take 40 mg by mouth daily.   Yes [provider]  metFORMIN (GLUCOPHAGE-XR) 500 MG 24 hr tablet TAKE TWO TABLETS BY MOUTH ONCE DAILY IN THE MORNING 03/18/16  Yes Biagio Borg, MD  metolazone (ZAROXOLYN) 2.5 MG tablet TAKE ONE TABLET BY MOUTH EVERY OTHER DAY 01/10/16  Yes [place], Peter M, MD  rosuvastatin (CRESTOR) 20 MG tablet Take 1 tablet (20 mg total) by mouth daily. 09/10/16  Yes Biagio Borg, MD  albuterol (PROVENTIL HFA;VENTOLIN HFA) 108 (90 BASE) MCG/ACT inhaler Inhale 2 puffs into the lungs every 6 (six) hours as needed for wheezing or shortness of breath. 07/19/14   Biagio Borg, MD  Blood Glucose Monitoring Suppl (ONE TOUCH ULTRA SYSTEM KIT) w/Device KIT Use as directed daily 09/10/16   Biagio Borg, MD  cetirizine (ZYRTEC) 10 MG tablet Take 1 tablet (10 mg total) by mouth daily. Patient not taking: Reported on 11/26/2016 09/22/13   Biagio Borg, MD  furosemide (LASIX) 40 MG tablet Take 1 tablet (40 mg total) by mouth daily. Patient not taking: Reported on 11/26/2016 07/08/16 10/15/16  [place], Peter M, MD  glucose blood test strip Use as instructed  09/10/16   Biagio Borg, MD  Lancets MISC Use as directed once daily 09/10/16   Biagio Borg, MD  sildenafil (REVATIO) 20 MG tablet Take 1 tablet (20 mg total) by mouth as directed. Take 2-4 tablets by mouth as needed Patient taking differently: Take 40 mg by mouth daily as needed (ed).  10/15/16   Evans Lance, MD  silver sulfADIAZINE (SILVADENE) 1 % cream Apply 1 application topically daily. Patient not taking: Reported on 11/26/2016 10/16/15   Gean Birchwood, DPM  spironolactone (ALDACTONE) 25 MG tablet Take 0.5 tablets (12.5 mg total) by mouth daily. Patient not taking: Reported on 11/26/2016 06/08/16   [place], Peter M, MD  Tadalafil 2.5 MG TABS Take 1 tablet (2.5 mg total) by mouth daily as needed. Patient taking differently: Take 2.5 mg by mouth daily as needed (erectile dysfunction).  03/04/15   Brett Canales, PA-C    Vital Signs: BP (!) 113/57   Pulse 65   Temp 98.5 F (36.9 C) (Oral)   Resp (!) 22   Ht '6\' 1"'$  (1.854 m)   Wt 181 lb 14.1 oz (82.5 kg)   SpO2 99%   BMI 24.00 kg/m   Physical Exam: Abd: soft, drain in place with no output currently.  Drain site is c/d/i  Imaging: Ct Abdomen Pelvis W Contrast  Result Date: 12/29/2016 CLINICAL DATA:  Abdominal infection including peritonitis. Repair of perforated pyloric channel ulcer, 11/27/2016. EXAM:  CT ABDOMEN AND PELVIS WITH CONTRAST TECHNIQUE: Multidetector CT imaging of the abdomen and pelvis was performed using the standard protocol following bolus administration of intravenous contrast. CONTRAST:  122m ISOVUE-300 IOPAMIDOL (ISOVUE-300) INJECTION 61% COMPARISON:  12/24/2016, 12/21/2016, 12/13/2016 CT FINDINGS: Lower chest: Stable cardiomegaly without pericardial effusion. ICD lead noted in the right ventricle. Moderate right and small left pleural effusions are present with adjacent compressive atelectasis, slightly increased on the left since 12/21/2016. Hepatobiliary: 1 cm subcapsular hypodense lesion in the right hepatic  lobe, series 3, image 22, stable in appearance, more commonly associated with a cyst or hemangioma. No biliary dilatation. Physiologic distention of the gallbladder without calculus or secondary signs of acute cholecystitis. Pancreas: Unremarkable Spleen: Unremarkable Adrenals/Urinary Tract: Stable bilateral renal cysts. No obstructive uropathy. Normal bilateral adrenal glands. No hydroureteronephrosis. The urinary bladder is physiologically distended. Stomach/Bowel: A feeding tube is seen within the distal duodenum terminating near the ligament of Treitz. Stomach is physiologically distended with enteric contrast. No bowel obstruction or inflammation. Again noted are a few dots of gas along the gastrohepatic ligament without significant worsening. Enteric contrast reaches the rectum. 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 Interval drainage via percutaneous pigtail catheter of the fluid collection within the lower abdomen. The tip of the pigtail catheter seen in the midline of the upper pelvis with small residual volume of fluid/abscess seen measuring approximately 4.9 x 2.5 x 2.5 cm. Vascular/Lymphatic: Moderate aortoiliac and branch vessel atherosclerosis. No aneurysm. No lymphadenopathy. Reproductive: Unremarkable Other: Percutaneous drainage pigtail catheter is noted knee left lower quadrant approach. Mild third spacing of fluid in the subcutaneous and mesenteric soft tissues. Musculoskeletal: Thoracolumbar spondylosis. Lumbosacral transitional vertebral anatomy. IMPRESSION: 1. Interval near complete decompression of previously noted abscess cavity status post percutaneous drainage catheter placement via left lower quadrant approach. A small amount of residual fluid persist measuring 4.9 x 2.5 x 2.5 cm to the right of the catheter. 2.  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 3. Slight increase small left effusion. 4. Several small loculations of gas are again seen along the gastrohepatic ligament without significant change. 5. 1 cm fluid attenuating structure along the posterior right hepatic lobe margin, nonspecific but stable in appearance. 6. Moderate cardiomegaly with bilateral right greater than left pleural effusions with compressive atelectasis. 7. Feeding tube noted within the distal duodenum. 8. Mild third spacing of fluid. Electronically Signed   By: DAshley RoyaltyM.D.   On: 12/29/2016 15:49   Dg Chest Port 1 View  Result Date: 12/27/2016 CLINICAL DATA:  Pleural effusion. EXAM: PORTABLE CHEST 1 VIEW COMPARISON:  Most recent chest radiograph yesterday. FINDINGS: Enteric tube in place, tip below the diaphragm. Left-sided pacemaker remains in place. Stable cardiomegaly and mediastinal contours. Hazy opacity at the lung bases consistent with pleural effusions and likely underlying atelectasis, worsening prior exam. Worsening vascular congestion. No pneumothorax. IMPRESSION: Worsening hazy opacity at the lung bases consistent with increasing pleural effusions/atelectasis. Worsening vascular congestion. Electronically Signed   By: MJeb LeveringM.D.   On: 12/27/2016 17:18    Labs:  CBC:  Recent Labs  12/24/16 0448 12/25/16 0221 12/26/16 0932 12/28/16 0534  WBC 7.9 9.8 6.3 6.9  HGB 8.5* 7.3* 8.2* 9.0*  HCT 27.2* 23.5* 27.4* 30.2*  PLT 304 356 284 343  COAGS:  Recent Labs  11/26/16 2315 11/30/16 1036 12/23/16 0409  INR 1.23 1.35 1.28  APTT  --  36 25    BMP:  Recent Labs  12/25/16 0221 12/26/16 0932 12/28/16 0534 12/29/16 0908  NA 145 148* 147* 148*  K 3.5 3.6 3.1* 3.7  CL 112* 117* 116* 118*  CO2 22 20* 23 25  GLUCOSE 90 121* 110* 78  BUN 46* 30* 22* 25*    CALCIUM 7.4* 7.5* 7.7* 7.8*  CREATININE 1.35* 0.96 0.82 0.85  GFRNONAA 50* >60 >60 >60  GFRAA 59* >60 >60 >60    LIVER FUNCTION TESTS:  Recent Labs  12/22/16 0514 12/23/16 0409 12/25/16 0221 12/28/16 0534  BILITOT 0.7 0.9 0.9 0.6  AST 149* 161* 70* 39  ALT 125* 142* 99* 49  ALKPHOS 156* 143* 130* 92  PROT 6.0* 6.2* 6.2* 5.7*  ALBUMIN 1.6* 1.7* 1.8* 2.0*    Assessment and Plan: 1. Intra-abdominal abscess -drain is not draining much fluid. -repeat CT scan today shows near complete decompression of previous fluid collection.  There is still residual fluid to the right of the catheter though.  This will be discussed with Dr. Earleen Newport to determine if his drain can be removed or not.   Electronically Signed: Henreitta Cea 12/29/2016, 4:18 PM   I spent a total of 15 Minutes at the the patient's bedside AND on the patient's hospital floor or unit, greater than 50% of which was counseling/coordinating care for intra-abdominal abscess

## 2016-12-30 LAB — GLUCOSE, CAPILLARY
GLUCOSE-CAPILLARY: 58 mg/dL — AB (ref 65–99)
GLUCOSE-CAPILLARY: 63 mg/dL — AB (ref 65–99)
GLUCOSE-CAPILLARY: 60 mg/dL — AB (ref 65–99)
Glucose-Capillary: 70 mg/dL (ref 65–99)
Glucose-Capillary: 100 mg/dL — ABNORMAL HIGH (ref 65–99)
Glucose-Capillary: 122 mg/dL — ABNORMAL HIGH (ref 65–99)
Glucose-Capillary: 124 mg/dL — ABNORMAL HIGH (ref 65–99)

## 2016-12-30 LAB — BASIC METABOLIC PANEL
ANION GAP: 9 (ref 5–15)
BUN: 24 mg/dL — ABNORMAL HIGH (ref 6–20)
CHLORIDE: 118 mmol/L — AB (ref 101–111)
CO2: 19 mmol/L — ABNORMAL LOW (ref 22–32)
Calcium: 8 mg/dL — ABNORMAL LOW (ref 8.9–10.3)
Creatinine, Ser: 1 mg/dL (ref 0.61–1.24)
GFR calc non Af Amer: >60 mL/min (ref >60)
Glucose, Bld: 60 mg/dL — ABNORMAL LOW (ref 65–99)
POTASSIUM: 4.4 mmol/L (ref 3.5–5.1)
SODIUM: 146 mmol/L — AB (ref 135–145)

## 2016-12-30 LAB — BLOOD CULTURE ID PANEL (REFLEXED)
ACINETOBACTER BAUMANNII: NOT DETECTED
CANDIDA ALBICANS: NOT DETECTED
Candida glabrata: NOT DETECTED
Candida krusei: NOT DETECTED
Candida parapsilosis: NOT DETECTED
Candida tropicalis: NOT DETECTED
Carbapenem resistance: NOT DETECTED
ENTEROBACTER CLOACAE COMPLEX: NOT DETECTED
ENTEROBACTERIACEAE SPECIES: NOT DETECTED
ENTEROCOCCUS SPECIES: NOT DETECTED
Escherichia coli: NOT DETECTED
HAEMOPHILUS INFLUENZAE: NOT DETECTED
Klebsiella oxytoca: NOT DETECTED
Klebsiella pneumoniae: NOT DETECTED
Listeria monocytogenes: NOT DETECTED
METHICILLIN RESISTANCE: DETECTED — AB
NEISSERIA MENINGITIDIS: NOT DETECTED
PROTEUS SPECIES: NOT DETECTED
PSEUDOMONAS AERUGINOSA: NOT DETECTED
STAPHYLOCOCCUS AUREUS BCID: NOT DETECTED
STREPTOCOCCUS PNEUMONIAE: NOT DETECTED
STREPTOCOCCUS PYOGENES: NOT DETECTED
STREPTOCOCCUS SPECIES: NOT DETECTED
Serratia marcescens: NOT DETECTED
Staphylococcus species: DETECTED — AB
Streptococcus agalactiae: NOT DETECTED
VANCOMYCIN RESISTANCE: NOT DETECTED

## 2016-12-30 LAB — CBC
HCT: 30.2 % — ABNORMAL LOW (ref 39.0–52.0)
HEMOGLOBIN: 9.3 g/dL — AB (ref 13.0–17.0)
MCH: 33.3 pg (ref 26.0–34.0)
MCHC: 30.8 g/dL (ref 30.0–36.0)
MCV: 108.2 fL — ABNORMAL HIGH (ref 78.0–100.0)
Platelets: 304 10*3/uL (ref 150–400)
RBC: 2.79 MIL/uL — ABNORMAL LOW (ref 4.22–5.81)
RDW: 16.7 % — ABNORMAL HIGH (ref 11.5–15.5)
WBC: 5.7 10*3/uL (ref 4.0–10.5)

## 2016-12-30 LAB — CULTURE, BLOOD (ROUTINE X 2)
Culture: NO GROWTH
Special Requests: ADEQUATE

## 2016-12-30 MED ORDER — DEXTROSE 5 % IV SOLN
INTRAVENOUS | Status: DC
  Administered 2016-12-30: 22:00:00 via INTRAVENOUS
  Administered 2016-12-30: 50 mL via INTRAVENOUS

## 2016-12-30 MED ORDER — LACTATED RINGERS IV BOLUS (SEPSIS)
500.0000 mL | Freq: Once | INTRAVENOUS | Status: AC
  Administered 2016-12-30: 500 mL via INTRAVENOUS

## 2016-12-30 MED ORDER — FLUCONAZOLE IN SODIUM CHLORIDE 200-0.9 MG/100ML-% IV SOLN
200.0000 mg | INTRAVENOUS | Status: DC
  Administered 2016-12-30 – 2016-12-31 (×2): 200 mg via INTRAVENOUS
  Filled 2016-12-30 (×3): qty 100

## 2016-12-30 MED ORDER — FUROSEMIDE 10 MG/ML IJ SOLN: 20.0000 mg | Freq: Two times a day (BID) | INTRAMUSCULAR | Status: DC | PRN

## 2016-12-30 MED ORDER — DEXTROSE 50 % IV SOLN
INTRAVENOUS | Status: AC
  Administered 2016-12-30: 25 mL
  Filled 2016-12-30: qty 50

## 2016-12-30 MED ORDER — DEXTROSE 50 % IV SOLN
INTRAVENOUS | Status: AC
  Filled 2016-12-30: qty 50

## 2016-12-30 MED ORDER — DEXTROSE 50 % IV SOLN
1.0000 | Freq: Once | INTRAVENOUS | Status: AC
Start: 2016-12-30 — End: 2016-12-30
  Administered 2016-12-30: 50 mL via INTRAVENOUS

## 2016-12-30 MED ORDER — DEXTROSE 50 % IV SOLN
50.0000 mL | INTRAVENOUS | Status: AC
  Administered 2016-12-30: 50 mL via INTRAVENOUS
  Filled 2016-12-30: qty 50

## 2016-12-30 NOTE — Progress Notes (Signed)
PHARMACY - PHYSICIAN COMMUNICATION CRITICAL VALUE ALERT - BLOOD CULTURE IDENTIFICATION (BCID)  Results for orders placed or performed during the hospital encounter of 11/26/16  Blood Culture ID Panel (Reflexed) (Collected: 12/25/2016  2:35 AM)  Result Value Ref Range   Enterococcus species NOT DETECTED NOT DETECTED   Vancomycin resistance NOT DETECTED NOT DETECTED   Listeria monocytogenes NOT DETECTED NOT DETECTED   Staphylococcus species DETECTED (A) NOT DETECTED   Staphylococcus aureus NOT DETECTED NOT DETECTED   Methicillin resistance DETECTED (A) NOT DETECTED   Streptococcus species NOT DETECTED NOT DETECTED   Streptococcus agalactiae NOT DETECTED NOT DETECTED   Streptococcus pneumoniae NOT DETECTED NOT DETECTED   Streptococcus pyogenes NOT DETECTED NOT DETECTED   Acinetobacter baumannii NOT DETECTED NOT DETECTED   Enterobacteriaceae species NOT DETECTED NOT DETECTED   Enterobacter cloacae complex NOT DETECTED NOT DETECTED   Escherichia coli NOT DETECTED NOT DETECTED   Klebsiella oxytoca NOT DETECTED NOT DETECTED   Klebsiella pneumoniae NOT DETECTED NOT DETECTED   Proteus species NOT DETECTED NOT DETECTED   Serratia marcescens NOT DETECTED NOT DETECTED   Carbapenem resistance NOT DETECTED NOT DETECTED   Haemophilus influenzae NOT DETECTED NOT DETECTED   Neisseria meningitidis NOT DETECTED NOT DETECTED   Pseudomonas aeruginosa NOT DETECTED NOT DETECTED   Candida albicans NOT DETECTED NOT DETECTED   Candida glabrata NOT DETECTED NOT DETECTED   Candida krusei NOT DETECTED NOT DETECTED   Candida parapsilosis NOT DETECTED NOT DETECTED   Candida tropicalis NOT DETECTED NOT DETECTED    Name of physician (or Provider) Contacted: K Schorr NP  Changes to prescribed antibiotics required: Lab reports coag-negative staph w/ methicillin resistance, may be contaminant but pt is on daptomycin and is adequately covered.  Vernard Gambles, PharmD, BCPS  12/30/2016  4:05 AM

## 2016-12-30 NOTE — Progress Notes (Signed)
Hypoglycemic Event  CBG: 60  Treatment: 15 GM carbohydrate snack, D50 IV 25 mL and D50 IV 50 mL  Symptoms: None  Follow-up CBG: Time:1807 CBG Result:122  Possible Reasons for Event: Inadequate meal intake (Patient is NPO and is not on tube feeding)  Comments/MD notified: Patient given 1/2 amp (25 ml) D50 and Dr. Thedore Mins notified. An additional 1 amp (50 ml) of D50 was ordered and given. Patient also given 120 ml of orange juice per MD order.     Earlene Plater, Lollie Marrow

## 2016-12-30 NOTE — Progress Notes (Signed)
Referring Physician(s):  Dr. Gardenia Phlegm  Supervising Physician: Aletta Edouard  Patient Status:  [facility] - In-pt  Chief Complaint:  Intra-abdominal abscess  Subjective: Alert, no complaints. Small amount of brown output in bulb.   Allergies: Patient has no known allergies.  Medications: Prior to Admission medications   Medication Sig Start Date End Date Taking? Authorizing Provider  aspirin 81 MG EC tablet Take 81 mg by mouth daily.     Yes [provider]  carvedilol (COREG) 12.5 MG tablet TAKE ONE TABLET BY MOUTH TWICE DAILY 04/27/16  Yes Martin[place], Peter M, MD  fluticasone [facility]) 50 MCG/ACT nasal spray Place 2 sprays into both nostrils daily. 09/22/13  Yes Biagio Borg, MD  furosemide (LASIX) 40 MG tablet Take 40 mg by mouth daily.   Yes [provider]  metFORMIN (GLUCOPHAGE-XR) 500 MG 24 hr tablet TAKE TWO TABLETS BY MOUTH ONCE DAILY IN THE MORNING 03/18/16  Yes Biagio Borg, MD  metolazone (ZAROXOLYN) 2.5 MG tablet TAKE ONE TABLET BY MOUTH EVERY OTHER DAY 01/10/16  Yes [place], Peter M, MD  rosuvastatin (CRESTOR) 20 MG tablet Take 1 tablet (20 mg total) by mouth daily. 09/10/16  Yes Biagio Borg, MD  albuterol (PROVENTIL HFA;VENTOLIN HFA) 108 (90 BASE) MCG/ACT inhaler Inhale 2 puffs into the lungs every 6 (six) hours as needed for wheezing or shortness of breath. 07/19/14   Biagio Borg, MD  Blood Glucose Monitoring Suppl (ONE TOUCH ULTRA SYSTEM KIT) w/Device KIT Use as directed daily 09/10/16   Biagio Borg, MD  cetirizine (ZYRTEC) 10 MG tablet Take 1 tablet (10 mg total) by mouth daily. Patient not taking: Reported on 11/26/2016 09/22/13   Biagio Borg, MD  furosemide (LASIX) 40 MG tablet Take 1 tablet (40 mg total) by mouth daily. Patient not taking: Reported on 11/26/2016 07/08/16 10/15/16  [place], Peter M, MD  glucose blood test strip Use as instructed 09/10/16   Biagio Borg, MD  Lancets MISC Use as directed once daily 09/10/16   Biagio Borg, MD  sildenafil  (REVATIO) 20 MG tablet Take 1 tablet (20 mg total) by mouth as directed. Take 2-4 tablets by mouth as needed Patient taking differently: Take 40 mg by mouth daily as needed (ed).  10/15/16   Evans Lance, MD  silver sulfADIAZINE (SILVADENE) 1 % cream Apply 1 application topically daily. Patient not taking: Reported on 11/26/2016 10/16/15   Gean Birchwood, DPM  spironolactone (ALDACTONE) 25 MG tablet Take 0.5 tablets (12.5 mg total) by mouth daily. Patient not taking: Reported on 11/26/2016 06/08/16   [place], Peter M, MD  Tadalafil 2.5 MG TABS Take 1 tablet (2.5 mg total) by mouth daily as needed. Patient taking differently: Take 2.5 mg by mouth daily as needed (erectile dysfunction).  03/04/15   Brett Canales, PA-C     Vital Signs: BP (!) 100/49 (BP Location: Left Leg)   Pulse 78   Temp (!) 96.9 F (36.1 C) (Axillary)   Resp (!) 24   Ht 6' 1" (1.854 m)   Wt 185 lb 13.6 oz (84.3 kg)   SpO2 97%   BMI 24.52 kg/m   Physical Exam  NAD, alert, communicative Abd: Drain in place in LLQ.  Site intact without erythema or warmth.  Abdominal dressing with binder in place.  Scant amount of dark brown output.   Imaging: Ct Abdomen Pelvis W Contrast  Result Date: 12/29/2016 CLINICAL DATA:  Abdominal infection including peritonitis. Repair of  perforated pyloric channel ulcer, 11/27/2016. EXAM: CT ABDOMEN AND PELVIS WITH CONTRAST TECHNIQUE: Multidetector CT imaging of the abdomen and pelvis was performed using the standard protocol following bolus administration of intravenous contrast. CONTRAST:  164m ISOVUE-300 IOPAMIDOL (ISOVUE-300) INJECTION 61% COMPARISON:  12/24/2016, 12/21/2016, 12/13/2016 CT FINDINGS: Lower chest: Stable cardiomegaly without pericardial effusion. ICD lead noted in the right ventricle. Moderate right and small left pleural effusions are present with adjacent compressive atelectasis, slightly increased on the left since 12/21/2016. Hepatobiliary: 1 cm subcapsular hypodense  lesion in the right hepatic lobe, series 3, image 22, stable in appearance, more commonly associated with a cyst or hemangioma. No biliary dilatation. Physiologic distention of the gallbladder without calculus or secondary signs of acute cholecystitis. Pancreas: Unremarkable Spleen: Unremarkable Adrenals/Urinary Tract: Stable bilateral renal cysts. No obstructive uropathy. Normal bilateral adrenal glands. No hydroureteronephrosis. The urinary bladder is physiologically distended. Stomach/Bowel: A feeding tube is seen within the distal duodenum terminating near the ligament of Treitz. Stomach is physiologically distended with enteric contrast. No bowel obstruction or inflammation. Again noted are a few dots of gas along the gastrohepatic ligament without significant worsening. Enteric contrast reaches the rectum. 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 Interval drainage via percutaneous pigtail catheter of the fluid collection within the lower abdomen. The tip of the pigtail catheter seen in the midline of the upper pelvis with small residual volume of fluid/abscess seen measuring approximately 4.9 x 2.5 x 2.5 cm. Vascular/Lymphatic: Moderate aortoiliac and branch vessel atherosclerosis. No aneurysm. No lymphadenopathy. Reproductive: Unremarkable Other: Percutaneous drainage pigtail catheter is noted knee left lower quadrant approach. Mild third spacing of fluid in the subcutaneous and mesenteric soft tissues. Musculoskeletal: Thoracolumbar spondylosis. Lumbosacral transitional vertebral anatomy. IMPRESSION: 1. Interval near complete decompression of previously noted abscess cavity status post percutaneous drainage catheter placement via left lower quadrant approach. A small amount of residual fluid persist measuring 4.9 x 2.5 x 2.5 cm to the right of  the catheter. 2. 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 3. Slight increase small left effusion. 4. Several small loculations of gas are again seen along the gastrohepatic ligament without significant change. 5. 1 cm fluid attenuating structure along the posterior right hepatic lobe margin, nonspecific but stable in appearance. 6. Moderate cardiomegaly with bilateral right greater than left pleural effusions with compressive atelectasis. 7. Feeding tube noted within the distal duodenum. 8. Mild third spacing of fluid. Electronically Signed   By: DAshley RoyaltyM.D.   On: 12/29/2016 15:49   Dg Chest Port 1 View  Result Date: 12/27/2016 CLINICAL DATA:  Pleural effusion. EXAM: PORTABLE CHEST 1 VIEW COMPARISON:  Most recent chest radiograph yesterday. FINDINGS: Enteric tube in place, tip below the diaphragm. Left-sided pacemaker remains in place. Stable cardiomegaly and mediastinal contours. Hazy opacity at the lung bases consistent with pleural effusions and likely underlying atelectasis, worsening prior exam. Worsening vascular congestion. No pneumothorax. IMPRESSION: Worsening hazy opacity at the lung bases consistent with increasing pleural effusions/atelectasis. Worsening vascular congestion. Electronically Signed   By: MJeb LeveringM.D.   On: 12/27/2016 17:18    Labs:  CBC:  Recent Labs  12/25/16 0221 12/26/16 0932 12/28/16 0534 12/30/16 0634  WBC 9.8 6.3 6.9 5.7  HGB 7.3* 8.2* 9.0* 9.3*  HCT 23.5* 27.4* 30.2* 30.2*  PLT 356 284 343 304    COAGS:  Recent Labs  11/26/16 2315 11/30/16 1036 12/23/16 0409  INR 1.23 1.35 1.28  APTT  --  36 25    BMP:  Recent Labs  12/26/16 0932 12/28/16 0534 12/29/16 0908 12/30/16 0352  NA 148* 147* 148* 146*  K 3.6 3.1* 3.7 4.4  CL 117* 116* 118* 118*  CO2 20* 23 25 19*  GLUCOSE 121* 110* 78 60*  BUN 30*  22* 25* 24*  CALCIUM 7.5* 7.7* 7.8* 8.0*  CREATININE 0.96 0.82 0.85 1.00  GFRNONAA >60 >60 >60 >60  GFRAA >60 >60 >60 >60    LIVER FUNCTION TESTS:  Recent Labs  12/22/16 0514 12/23/16 0409 12/25/16 0221 12/28/16 0534  BILITOT 0.7 0.9 0.9 0.6  AST 149* 161* 70* 39  ALT 125* 142* 99* 49  ALKPHOS 156* 143* 130* 92  PROT 6.0* 6.2* 6.2* 5.7*  ALBUMIN 1.6* 1.7* 1.8* 2.0*    Assessment and Plan: Intrabdominal abscess s/p drain placement 12/24/16 by Dr. Laurence Ferrari IR following for patient with abdominal abscess drain.  Per Dr. Earleen Newport, no residual fluid at location of drain, though a small amount of fluid remains adjacent, but not likely communicating, with current drain. OK to pull. Drain removed.  Scant amount of output in bulb.  Patient tolerated well.   Note GOC meeting as well today.  IR available if needed.   Electronically Signed: Docia Barrier, PA 12/30/2016, 1:01 PM   I spent a total of 15 Minutes at the the patient's bedside AND on the patient's hospital floor or unit, greater than 50% of which was counseling/coordinating care for abdominal abscess

## 2016-12-30 NOTE — Progress Notes (Signed)
Goals of care meeting scheduled for 10:15AM today per daughters request. Anderson Malta, DO Palliative Medicine

## 2016-12-30 NOTE — Progress Notes (Signed)
Palliative Care Family Meeting Reason: Goals of Care  I met with Jacob Pittman, his daughter Jacob Pittman at bedside and daughter Jacob Pittman, his legal spouse (separated for 17 years but never officially divorced) by speaker phone.  Jacob Pittman was remarkably lucid, clear mentally and able to accurately and confidently express his goals and wishes to his family. They all understand how serious his condition is and also the uncertainty of the future and his prognosis. I offered several options and discussed possible trajectories of treatment choices including TPN/NPO, PO/Wound/Ostormy, Strict comfort care and comfort care with treatments offered that will improve his QOL and also potentially treat any reversible illness when medically possible.   Jacob Pittman explained to his family that he is tired, his first goals was to be "strainght in his heart" he told his family that he loved them and they had a positive exchange about past life struggles and forgiveness- even shared humor together under these circumstances- his daughter said this was the first time she had seen him smile. His goals are as follows:  1. Comfort is his primary goal 2. Treat reversible illness when medically reasonable and does not involve overly invasive or rigorous treatment plans- antibiotics for now but may elect to stop these if further decline occurs. 3. D/C Cortrak, he wants to be able to eat as tolerated, No TPN now or in the future. 4. Discussed Dr. Josetta Huddle note about treating colonic-cuteaneous fistula as colostomy 5. I discussed how fragile he is- death at anytime is possible and also he may stabilize out for some period of time-I have recommended now that we have established comfort and QOL now and future QOL as a priority that we give this another day or two before discussing specific hospice options and next steps in his care-he is extremely hypotensive right now and my guess is he will either decline rapidly after our  conversation today or rally- difficult to tell at this point- this will be important in deciding about discharge options.  Lane Hacker, DO Palliative Medicine [phone]  Time: 35 min Greater than 50%  of this time was spent counseling and coordinating care related to the above assessment and plan.

## 2016-12-30 NOTE — Progress Notes (Signed)
PROGRESS NOTE    Jacob Pittman  ERX:[id number] DOB: December 16, 1945 DOA: 11/26/2016 PCP: [name], MD   Brief Narrative:  Jacob Pittman is a 71 y.o. 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 Was found to have a perforated pyloric channel ulcer and pneumoperitonitis s/p omental patch repair subsequently on epinephrine and Levophed.  8/12-  TTE LV moderately dilated with EF 15% &diffuse hypokinesis. There is akinesis of the inferolateral and inferior myocardium.  E coli UTI noted 8/13-  right brachial A- line placed and then developed ischemic right arm- vascular consult and transfer from [name] to [facility]- thrombectomy of brachial artery- heparin Paroxysmal A-fib also noted 8/14- Decreased attenuation involving the right dentate nucleus in the right cerebellum and immediately adjacent cerebellar parenchyma, concerning for recent and potentially acute infarct in this area.  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 Pylori ulcer, Septic shock with Abdominal Abscess and cutaneocolonic fistula.  Patient has had a prolonged hospital course secondary to above problems. Patient has received multiple antibiotic therapies. He had evidence of a abscess tracking along sigmoid colon mesentery/omentum with drainage through the wound, evidence of wound necrosis. S/p drain  placement with cultures significant for multiple organisms. Drain not outputting much, I are managing the drain, general surgery following closely along with ID. Currently no feeding except gentle IV fluids which were started, added midodrine for blood pressure support, palliative care is having goals of care discussion with family on 12/30/2016 further management per decision.   Methicillin resistant staph species bacteremia  - Seen on 9/3 blood cultures x2. IJ removed 12/24/16, infectious disease recommendations: daptomycin, repeat blood cultures obtained 12/25/16 and are pending (no growth x2 days)  Tachypnea - due to atelectasis along with oral secretions and possible Pulmonary edema on imaging. Improved with Lasix, currently appears slightly dehydrated hold further Lasix.  Acute on chronic systolic and diastolic heart failure - Repeat echo significant for improved EF of 25-30% from 15% with grade 2 diastolic dysfunction. Has been adequately diuresed, now sodium is trending high and urine appears extremely concentrated, hold further Lasix gentle hydration and monitor.    Paroxysmal atrial fibrillation  [place] vasc 2 score  of at least 5. continue metoprolol and for now full dose Lovenox.  Bilateral basilar atelectasis  -continue to encourage incentive spirometry  Hypernatremia - urine concentrated, hold Lasix and hydrate.     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   Arterial thrombosis  Right brachial artery. Secondary to A-line. S/p thrombectomy, on Lovenox.  Bilateral arm edema No DVT on ultrasound, diuresis as possible.   Fatty liver - supportive care  Pacer firing -  Read as asystole which does not appear to be the case. No arrhythmia noted. Seen by cardiology for device interrogation.  Cognitive impairment - mild delirium. Monitor.    Diabetes mellitus, type  2 A1c of 5.8%, -continue SSI  CBG (last 3)   Recent Labs   12/30/16 0308 12/30/16 0715 12/30/16 0800  GLUCAP 70 58* 100*      DVT prophylaxis: Xarelto/Lovenox Code Status: DNR Family Communication: None at bedside. Called daughter without answer Disposition Plan: Discharge pending goals of care   Consultants:   General surgery  Interventional radiology  Palliative care medicine  Procedures:   8/10- ex lap- patch repair of pyloric ucler  8/13- brachial embolectomy  8/12- EF 15%, diffuse hypokinesis and akinesis of inferolateral/inferioir myocardium  9/6- Percutaneous drain  9/7- Echocardiogram: EF of 25-30%, grade 2 diastolic dysfunction. No evidence of vegetations   Antimicrobials:  Amoxicillin  Unasyn  Clarithromycin  Metronidazole  Zosyn  Vancomycin  Daptomycin    Objective:  Vitals:   12/30/16 0000 12/30/16 0303 12/30/16 0400 12/30/16 0716  BP: (!) 80/32 (!) 90/50  (!) 79/26  Pulse:  62  (!) 57  Resp:  (!) 24  (!) 23  Temp:  98.2 F (36.8 C)  97.9 F (36.6 C)  TempSrc:  Axillary  Oral  SpO2:  100%  100%  Weight:   84.3 kg (185 lb 13.6 oz)   Height:        Intake/Output Summary (Last 24 hours) at 12/30/16 1050 Last data filed at 12/30/16 0958  Gross per 24 hour  Intake              275 ml  Output             1725 ml  Net            -1450 ml   Filed Weights   12/28/16 0407 12/29/16 0300 12/30/16 0400  Weight: 83.2 kg (183 lb 6.8 oz) 82.5 kg (181 lb 14.1 oz) 84.3 kg (185 lb 13.6 oz)    Examination:  General exam: Appears calm and comfortable Respiratory system: Clear to auscultation with decreased breath sounds bilaterally. Respiratory effort normal. Cardiovascular system: S1 & S2 heard, RRR. 2/6 systolic murmur. Gastrointestinal system: Abdomen is nondistended, soft and nontender. Abdominal binder in place. Normal bowel sounds heard. Serous drainage in bulb. Foul odor from abdominal wound Central nervous system: Alert, oriented to person. Extremities: Bilateral upper extremity edema. No  calf tenderness Skin: No cyanosis. No rashes Psychiatry: Judgement and insight appear impaired. Mood & affect depressed and flat.     Data Reviewed: I have personally reviewed following labs and imaging studies  CBC:  Recent Labs Lab 12/24/16 0448 12/25/16 0221 12/26/16 0932 12/28/16 0534 12/30/16 0634  WBC 7.9 9.8 6.3 6.9 5.7  NEUTROABS  --  8.2*  --   --   --   HGB 8.5* 7.3* 8.2* 9.0* 9.3*  HCT 27.2* 23.5* 27.4* 30.2* 30.2*  MCV 106.7* 105.9* 108.3* 107.1* 108.2*  PLT 304 356 284 343 304   Basic Metabolic Panel:  Recent Labs Lab 12/25/16 0221 12/26/16 0932 12/28/16 0534 12/29/16 0908 12/30/16 0352  NA 145 148* 147* 148* 146*  K 3.5 3.6 3.1* 3.7 4.4  CL 112* 117* 116* 118* 118*  CO2 22 20* 23 25 19*  GLUCOSE 90 121* 110* 78 60*  BUN 46* 30* 22* 25* 24*  CREATININE 1.35* 0.96 0.82 0.85 1.00  CALCIUM 7.4* 7.5* 7.7* 7.8* 8.0*  MG 2.3  --   --   --   --   PHOS 3.7  --   --   --   --    GFR: Estimated Creatinine Clearance: 74.4 mL/min (by  C-G formula based on SCr of 1 mg/dL). Liver Function Tests:  Recent Labs Lab 12/25/16 0221 12/28/16 0534  AST 70* 39  ALT 99* 49  ALKPHOS 130* 92  BILITOT 0.9 0.6  PROT 6.2* 5.7*  ALBUMIN 1.8* 2.0*   No results for input(s): LIPASE, AMYLASE in the last 168 hours. No results for input(s): AMMONIA in the last 168 hours. Coagulation Profile: No results for input(s): INR, PROTIME in the last 168 hours. Cardiac Enzymes:  Recent Labs Lab 12/25/16 1020  CKTOTAL 48*   BNP (last 3 results) No results for input(s): PROBNP in the last 8760 hours. HbA1C: No results for input(s): HGBA1C in the last 72 hours. CBG:  Recent Labs Lab 12/29/16 2054 12/29/16 2314 12/30/16 0308 12/30/16 0715 12/30/16 0800  GLUCAP 89 76 70 58* 100*   Lipid Profile: No results for input(s): CHOL, HDL, LDLCALC, TRIG, CHOLHDL, LDLDIRECT in the last 72 hours. Thyroid Function Tests: No results for input(s): TSH, T4TOTAL, FREET4, T3FREE,  THYROIDAB in the last 72 hours. Anemia Panel: No results for input(s): VITAMINB12, FOLATE, FERRITIN, TIBC, IRON, RETICCTPCT in the last 72 hours. Sepsis Labs: No results for input(s): PROCALCITON, LATICACIDVEN in the last 168 hours.  Recent Results (from the past 240 hour(s))  Culture, blood (Routine X 2) w Reflex to ID Panel     Status: Abnormal   Collection Time: 12/21/16  6:32 PM  Result Value Ref Range Status   Specimen Description BLOOD LEFT HAND  Final   Special Requests   Final    BOTTLES DRAWN AEROBIC ONLY Blood Culture adequate volume   Culture  Setup Time   Final    GRAM POSITIVE COCCI IN CLUSTERS AEROBIC BOTTLE ONLY CRITICAL RESULT CALLED TO, READ BACK BY AND VERIFIED WITH: K COOK PHARMD [id number] 12/22/16 A BROWNING    Culture (A)  Final    STAPHYLOCOCCUS EPIDERMIDIS SUSCEPTIBILITIES PERFORMED ON PREVIOUS CULTURE WITHIN THE LAST 5 DAYS.    Report Status 12/25/2016 FINAL  Final  Blood Culture ID Panel (Reflexed)     Status: Abnormal   Collection Time: 12/21/16  6:32 PM  Result Value Ref Range Status   Enterococcus species NOT DETECTED NOT DETECTED Final   Listeria monocytogenes NOT DETECTED NOT DETECTED Final   Staphylococcus species DETECTED (A) NOT DETECTED Final    Comment: Methicillin (oxacillin) resistant coagulase negative staphylococcus. Possible blood culture contaminant (unless isolated from more than one blood culture draw or clinical case suggests pathogenicity). No antibiotic treatment is indicated for blood  culture contaminants. CRITICAL RESULT CALLED TO, READ BACK BY AND VERIFIED WITH: K COOK PHARMD [id number] 12/22/16 A BROWNING    Staphylococcus aureus NOT DETECTED NOT DETECTED Final   Methicillin resistance DETECTED (A) NOT DETECTED Final    Comment: CRITICAL RESULT CALLED TO, READ BACK BY AND VERIFIED WITH: K COOK PHARMD [id number] 12/22/16 A BROWNING    Streptococcus species NOT DETECTED NOT DETECTED Final   Streptococcus agalactiae NOT DETECTED NOT DETECTED Final    Streptococcus pneumoniae NOT DETECTED NOT DETECTED Final   Streptococcus pyogenes NOT DETECTED NOT DETECTED Final   Acinetobacter baumannii NOT DETECTED NOT DETECTED Final   Enterobacteriaceae species NOT DETECTED NOT DETECTED Final   Enterobacter cloacae complex NOT DETECTED NOT DETECTED Final   Escherichia coli NOT DETECTED NOT DETECTED Final   Klebsiella oxytoca NOT DETECTED NOT DETECTED Final   Klebsiella pneumoniae NOT DETECTED NOT DETECTED Final   Proteus species NOT DETECTED NOT DETECTED Final   Serratia marcescens NOT DETECTED NOT DETECTED Final  Haemophilus influenzae NOT DETECTED NOT DETECTED Final   Neisseria meningitidis NOT DETECTED NOT DETECTED Final   Pseudomonas aeruginosa NOT DETECTED NOT DETECTED Final   Candida albicans NOT DETECTED NOT DETECTED Final   Candida glabrata NOT DETECTED NOT DETECTED Final   Candida krusei NOT DETECTED NOT DETECTED Final   Candida parapsilosis NOT DETECTED NOT DETECTED Final   Candida tropicalis NOT DETECTED NOT DETECTED Final  Culture, blood (Routine X 2) w Reflex to ID Panel     Status: Abnormal   Collection Time: 12/21/16  6:38 PM  Result Value Ref Range Status   Specimen Description BLOOD RIGHT ARM  Final   Special Requests IN PEDIATRIC BOTTLE Blood Culture adequate volume  Final   Culture  Setup Time   Final    GRAM POSITIVE COCCI IN CLUSTERS IN PEDIATRIC BOTTLE CRITICAL VALUE NOTED.  VALUE IS CONSISTENT WITH PREVIOUSLY REPORTED AND CALLED VALUE.    Culture STAPHYLOCOCCUS EPIDERMIDIS (A)  Final   Report Status 12/25/2016 FINAL  Final   Organism ID, Bacteria STAPHYLOCOCCUS EPIDERMIDIS  Final      Susceptibility   Staphylococcus epidermidis - MIC*    CIPROFLOXACIN >=8 RESISTANT Resistant     ERYTHROMYCIN >=8 RESISTANT Resistant     GENTAMICIN 8 INTERMEDIATE Intermediate     OXACILLIN RESISTANT Resistant     TETRACYCLINE 2 SENSITIVE Sensitive     VANCOMYCIN 4 SENSITIVE Sensitive     TRIMETH/SULFA 160 RESISTANT Resistant      CLINDAMYCIN >=8 RESISTANT Resistant     RIFAMPIN <=0.5 SENSITIVE Sensitive     Inducible Clindamycin NEGATIVE Sensitive     * STAPHYLOCOCCUS EPIDERMIDIS  Surgical pcr screen     Status: None   Collection Time: 12/21/16  9:33 PM  Result Value Ref Range Status   MRSA, PCR NEGATIVE NEGATIVE Final   Staphylococcus aureus NEGATIVE NEGATIVE Final    Comment: (NOTE) The Xpert SA Assay (FDA approved for NASAL specimens in patients 54 years of age and older), is one component of a comprehensive surveillance program. It is not intended to diagnose infection nor to guide or monitor treatment.   Body fluid culture     Status: Abnormal   Collection Time: 12/24/16 12:13 PM  Result Value Ref Range Status   Specimen Description FLUID  Final   Special Requests ASCITIES  Final   Gram Stain   Final    RARE WBC PRESENT,BOTH PMN AND MONONUCLEAR NO ORGANISMS SEEN    Culture (A)  Final    MULTIPLE ORGANISMS PRESENT, NONE PREDOMINANT CRITICAL RESULT CALLED TO, READ BACK BY AND VERIFIED WITH: A. DAVIS RN, AT 1429 12/25/16 BY D. VANHOOK REGARDING CULTURE GROWTH    Report Status 12/26/2016 FINAL  Final  Culture, blood (routine x 2)     Status: None (Preliminary result)   Collection Time: 12/25/16  2:21 AM  Result Value Ref Range Status   Specimen Description BLOOD LEFT HAND  Final   Special Requests IN PEDIATRIC BOTTLE Blood Culture adequate volume  Final   Culture NO GROWTH 4 DAYS  Final   Report Status PENDING  Incomplete  Culture, blood (routine x 2)     Status: None (Preliminary result)   Collection Time: 12/25/16  2:35 AM  Result Value Ref Range Status   Specimen Description BLOOD LEFT WRIST  Final   Special Requests IN PEDIATRIC BOTTLE Blood Culture adequate volume  Final   Culture  Setup Time   Final    GRAM POSITIVE COCCI IN CLUSTERS IN PEDIATRIC BOTTLE  CRITICAL RESULT CALLED TO, READ BACK BY AND VERIFIED WITH: V.BRYK PHARMD 12/30/16 0342 L.CHAMPION    Culture GRAM POSITIVE COCCI IN CLUSTERS   Final   Report Status PENDING  Incomplete  Blood Culture ID Panel (Reflexed)     Status: Abnormal   Collection Time: 12/25/16  2:35 AM  Result Value Ref Range Status   Enterococcus species NOT DETECTED NOT DETECTED Final   Vancomycin resistance NOT DETECTED NOT DETECTED Final   Listeria monocytogenes NOT DETECTED NOT DETECTED Final   Staphylococcus species DETECTED (A) NOT DETECTED Final    Comment: CRITICAL RESULT CALLED TO, READ BACK BY AND VERIFIED WITH: V.BRYK PHARMD 12/30/16 0342 L.CHAMPION    Staphylococcus aureus NOT DETECTED NOT DETECTED Final   Methicillin resistance DETECTED (A) NOT DETECTED Final    Comment: CRITICAL RESULT CALLED TO, READ BACK BY AND VERIFIED WITH: V.BRYK PHARMD 12/30/16 0342 L.CHAMPION    Streptococcus species NOT DETECTED NOT DETECTED Final   Streptococcus agalactiae NOT DETECTED NOT DETECTED Final   Streptococcus pneumoniae NOT DETECTED NOT DETECTED Final   Streptococcus pyogenes NOT DETECTED NOT DETECTED Final   Acinetobacter baumannii NOT DETECTED NOT DETECTED Final   Enterobacteriaceae species NOT DETECTED NOT DETECTED Final   Enterobacter cloacae complex NOT DETECTED NOT DETECTED Final   Escherichia coli NOT DETECTED NOT DETECTED Final   Klebsiella oxytoca NOT DETECTED NOT DETECTED Final   Klebsiella pneumoniae NOT DETECTED NOT DETECTED Final   Proteus species NOT DETECTED NOT DETECTED Final   Serratia marcescens NOT DETECTED NOT DETECTED Final   Carbapenem resistance NOT DETECTED NOT DETECTED Final   Haemophilus influenzae NOT DETECTED NOT DETECTED Final   Neisseria meningitidis NOT DETECTED NOT DETECTED Final   Pseudomonas aeruginosa NOT DETECTED NOT DETECTED Final   Candida albicans NOT DETECTED NOT DETECTED Final   Candida glabrata NOT DETECTED NOT DETECTED Final   Candida krusei NOT DETECTED NOT DETECTED Final   Candida parapsilosis NOT DETECTED NOT DETECTED Final   Candida tropicalis NOT DETECTED NOT DETECTED Final     Radiology  Studies: Ct Abdomen Pelvis W Contrast  Result Date: 12/29/2016 CLINICAL DATA:  Abdominal infection including peritonitis. Repair of perforated pyloric channel ulcer, 11/27/2016. EXAM: CT ABDOMEN AND PELVIS WITH CONTRAST TECHNIQUE: Multidetector CT imaging of the abdomen and pelvis was performed using the standard protocol following bolus administration of intravenous contrast. CONTRAST:  ISOVUE-300 IOPAMIDOL (ISOVUE-300) INJECTION 61% COMPARISON:  12/24/2016, 12/21/2016, 12/13/2016 CT FINDINGS: Lower chest: Stable cardiomegaly without pericardial effusion. ICD lead noted in the right ventricle. Moderate right and small left pleural effusions are present with adjacent compressive atelectasis, slightly increased on the left since 12/21/2016. Hepatobiliary: 1 cm subcapsular hypodense lesion in the right hepatic lobe, series 3, image 22, stable in appearance, more commonly associated with a cyst or hemangioma. No biliary dilatation. Physiologic distention of the gallbladder without calculus or secondary signs of acute cholecystitis. Pancreas: Unremarkable Spleen: Unremarkable Adrenals/Urinary Tract: Stable bilateral renal cysts. No obstructive uropathy. Normal bilateral adrenal glands. No hydroureteronephrosis. The urinary bladder is physiologically distended. Stomach/Bowel: A feeding tube is seen within the distal duodenum terminating near the ligament of Treitz. Stomach is physiologically distended with enteric contrast. No bowel obstruction or inflammation. Again noted are a few dots of gas along the gastrohepatic ligament without significant worsening. Enteric contrast reaches the rectum. 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 Interval drainage via percutaneous pigtail  catheter of the fluid collection within the lower abdomen. The tip of  the pigtail catheter seen in the midline of the upper pelvis with small residual volume of fluid/abscess seen measuring approximately 4.9 x 2.5 x 2.5 cm. Vascular/Lymphatic: Moderate aortoiliac and branch vessel atherosclerosis. No aneurysm. No lymphadenopathy. Reproductive: Unremarkable Other: Percutaneous drainage pigtail catheter is noted knee left lower quadrant approach. Mild third spacing of fluid in the subcutaneous and mesenteric soft tissues. Musculoskeletal: Thoracolumbar spondylosis. Lumbosacral transitional vertebral anatomy. IMPRESSION: 1. Interval near complete decompression of previously noted abscess cavity status post percutaneous drainage catheter placement via left lower quadrant approach. A small amount of residual fluid persist measuring 4.9 x 2.5 x 2.5 cm to the right of the catheter. 2. 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 3. Slight increase small left effusion. 4. Several small loculations of gas are again seen along the gastrohepatic ligament without significant change. 5. 1 cm fluid attenuating structure along the posterior right hepatic lobe margin, nonspecific but stable in appearance. 6. Moderate cardiomegaly with bilateral right greater than left pleural effusions with compressive atelectasis. 7. Feeding tube noted within the distal duodenum. 8. Mild third spacing of fluid. Electronically Signed   By: Ashley Royalty M.D.   On: 12/29/2016 15:49     Scheduled Meds: . dextrose      . aspirin  81 mg Per Tube Daily  . chlorhexidine   Topical Once  . chlorhexidine gluconate (MEDLINE KIT)  15 mL Mouth Rinse BID  . enoxaparin (LOVENOX) injection  85 mg Subcutaneous Q12H  . insulin aspart  0-20 Units Subcutaneous Q4H  . lidocaine (PF)  5 mL Intradermal Once  . mouth rinse  15 mL Mouth Rinse QID  . midodrine  2.5 mg Oral TID WC  . pantoprazole  (PROTONIX) IV  40 mg Intravenous Q12H  . sodium chloride flush  5 mL Intravenous Q8H   Continuous Infusions: . sodium chloride Stopped (12/14/16 0953)  . sodium chloride 10 mL/hr at 12/27/16 0040  . DAPTOmycin (CUBICIN)  IV Stopped (12/29/16 1647)  . dextrose 50 mL (12/30/16 0729)  . lactated ringers    . piperacillin-tazobactam (ZOSYN)  IV 3.375 g (12/30/16 0921)     LOS: 33 days   Signature  Lala Lund M.D on 12/30/2016 at 10:51 AM  Between 7am to 7pm - Pager - [phone] ( page via [email or web address], text pages only, please mention full 10 digit call back number).  After 7pm go to [email or web address] - password [facility]

## 2016-12-30 NOTE — Progress Notes (Signed)
Hypoglycemic Event  CBG: 58  Treatment: D50 IV 50 mL  Symptoms: None  Follow-up CBG: Time:0800 CBG Result: 100  Possible Reasons for Event: Inadequate meal intake  Comments/MD notified:Dr. Thedore Mins started patient on d=D5W @ 50cc/hr    Earlene Plater, Lollie Marrow

## 2016-12-30 NOTE — Progress Notes (Signed)
Nutrition Follow Up/Brief Note  Chart reviewed. Pt now transitioning to comfort care.  Cortrak feeding tube removed. TF discontinued. No further nutrition interventions warranted at this time.  Please re-consult as needed.   Maureen Chatters, RD, LDN Pager #: [phone] After-Hours Pager #: [phone]

## 2016-12-30 NOTE — Progress Notes (Signed)
   12/30/16 1300  Clinical Encounter Type  Visited With Patient  Visit Type Follow-up;Spiritual support;Other (Comment) (Felt need to see)  Referral From Chaplain  Spiritual Encounters  Spiritual Needs Prayer  Stress Factors  Family Stress Factors None identified  Had visit with patient and  prayer. He requested card. Told patient if wanted chaplain to just tell his nurse and chaplain would be called.

## 2016-12-30 NOTE — Progress Notes (Signed)
30 Days Post-Op   Subjective/Chief Complaint: Pt denies pain   Objective: Vital signs in last 24 hours: Temp:  [97.4 F (36.3 C)-98.5 F (36.9 C)] 97.9 F (36.6 C) (09/12 0716) Pulse Rate:  [57-74] 57 (09/12 0716) Resp:  [21-31] 23 (09/12 0716) BP: (64-132)/(26-73) 79/26 (09/12 0716) SpO2:  [97 %-100 %] 100 % (09/12 0716) Weight:  [84.3 kg (185 lb 13.6 oz)] 84.3 kg (185 lb 13.6 oz) (09/12 0400) Last BM Date: 12/24/16  Intake/Output from previous day: 09/11 0701 - 09/12 0700 In: 275 [I.V.:25; IV Piggyback:250] Out: 1425 [Urine:1425] Intake/Output this shift: Total I/O In: -  Out: 300 [Urine:300]  Incision/Wound:open with minimal feculent drainage   JP serous   Lab Results:   Recent Labs  12/28/16 0534 12/30/16 0634  WBC 6.9 5.7  HGB 9.0* 9.3*  HCT 30.2* 30.2*  PLT 343 304   BMET  Recent Labs  12/29/16 0908 12/30/16 0352  NA 148* 146*  K 3.7 4.4  CL 118* 118*  CO2 25 19*  GLUCOSE 78 60*  BUN 25* 24*  CREATININE 0.85 1.00  CALCIUM 7.8* 8.0*   PT/INR No results for input(s): LABPROT, INR in the last 72 hours. ABG  Recent Labs  12/28/16 0948  PHART 7.459*  HCO3 24.6    Studies/Results: Ct Abdomen Pelvis W Contrast  Result Date: 12/29/2016 CLINICAL DATA:  Abdominal infection including peritonitis. Repair of perforated pyloric channel ulcer, 11/27/2016. EXAM: CT ABDOMEN AND PELVIS WITH CONTRAST TECHNIQUE: Multidetector CT imaging of the abdomen and pelvis was performed using the standard protocol following bolus administration of intravenous contrast. CONTRAST:  ISOVUE-300 IOPAMIDOL (ISOVUE-300) INJECTION 61% COMPARISON:  12/24/2016, 12/21/2016, 12/13/2016 CT FINDINGS: Lower chest: Stable cardiomegaly without pericardial effusion. ICD lead noted in the right ventricle. Moderate right and small left pleural effusions are present with adjacent compressive atelectasis, slightly increased on the left since 12/21/2016. Hepatobiliary: 1 cm subcapsular  hypodense lesion in the right hepatic lobe, series 3, image 22, stable in appearance, more commonly associated with a cyst or hemangioma. No biliary dilatation. Physiologic distention of the gallbladder without calculus or secondary signs of acute cholecystitis. Pancreas: Unremarkable Spleen: Unremarkable Adrenals/Urinary Tract: Stable bilateral renal cysts. No obstructive uropathy. Normal bilateral adrenal glands. No hydroureteronephrosis. The urinary bladder is physiologically distended. Stomach/Bowel: A feeding tube is seen within the distal duodenum terminating near the ligament of Treitz. Stomach is physiologically distended with enteric contrast. No bowel obstruction or inflammation. Again noted are a few dots of gas along the gastrohepatic ligament without significant worsening. Enteric contrast reaches the rectum. 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 Interval drainage via percutaneous pigtail catheter of the fluid collection within the lower abdomen. The tip of the pigtail catheter seen in the midline of the upper pelvis with small residual volume of fluid/abscess seen measuring approximately 4.9 x 2.5 x 2.5 cm. Vascular/Lymphatic: Moderate aortoiliac and branch vessel atherosclerosis. No aneurysm. No lymphadenopathy. Reproductive: Unremarkable Other: Percutaneous drainage pigtail catheter is noted knee left lower quadrant approach. Mild third spacing of fluid in the subcutaneous and mesenteric soft tissues. Musculoskeletal: Thoracolumbar spondylosis. Lumbosacral transitional vertebral anatomy. IMPRESSION: 1. Interval near complete decompression of previously noted abscess cavity status post percutaneous drainage catheter placement via left lower quadrant approach. A small amount of residual fluid persist measuring 4.9 x 2.5 x 2.5 cm to  the right of the catheter. 2. Serpiginous  extraluminal contrast is seen extending toward the ventral abdominal wound from what appears to be the proximal sigmoid raising concern for a colocutaneous fistula. 3. Slight increase small left effusion. 4. Several small loculations of gas are again seen along the gastrohepatic ligament without significant change. 5. 1 cm fluid attenuating structure along the posterior right hepatic lobe margin, nonspecific but stable in appearance. 6. Moderate cardiomegaly with bilateral right greater than left pleural effusions with compressive atelectasis. 7. Feeding tube noted within the distal duodenum. 8. Mild third spacing of fluid. Electronically Signed   By: Tollie Eth M.D.   On: 12/29/2016 15:49    Anti-infectives: Anti-infectives    Start     Dose/Rate Route Frequency Ordered Stop   12/25/16 1600  DAPTOmycin (CUBICIN) 500 mg in sodium chloride 0.9 % IVPB     500 mg 220 mL/hr over 30 Minutes Intravenous Every 24 hours 12/25/16 1002     12/25/16 1300  DAPTOmycin (CUBICIN) 500 mg in sodium chloride 0.9 % IVPB  Status:  Discontinued     500 mg 220 mL/hr over 30 Minutes Intravenous Every 24 hours 12/25/16 0956 12/25/16 1002   12/23/16 1800  vancomycin (VANCOCIN) IVPB 750 mg/150 ml premix  Status:  Discontinued     750 mg 150 mL/hr over 60 Minutes Intravenous Every 12 hours 12/23/16 0310 12/25/16 0941   12/23/16 0145  vancomycin (VANCOCIN) 1,750 mg in sodium chloride 0.9 % 500 mL IVPB     1,750 mg 250 mL/hr over 120 Minutes Intravenous  Once 12/23/16 0138 12/23/16 0353   12/21/16 1500  piperacillin-tazobactam (ZOSYN) IVPB 3.375 g     3.375 g 12.5 mL/hr over 240 Minutes Intravenous Every 8 hours 12/21/16 1456     12/13/16 1000  amoxicillin (AMOXIL) 250 MG/5ML suspension 1,000 mg     1,000 mg Per Tube Every 12 hours 12/13/16 0935 12/16/16 1833   12/12/16 1900  piperacillin-tazobactam (ZOSYN) IVPB 3.375 g  Status:  Discontinued     3.375 g 12.5 mL/hr over 240  Minutes Intravenous Every 8 hours 12/12/16 1759 12/13/16 0911   12/12/16 1400  Ampicillin-Sulbactam (UNASYN) 3 g in sodium chloride 0.9 % 100 mL IVPB  Status:  Discontinued     3 g 200 mL/hr over 30 Minutes Intravenous Every 6 hours 12/12/16 1215 12/12/16 1753   12/10/16 0600  amoxicillin (AMOXIL) 250 MG/5ML suspension 1,000 mg  Status:  Discontinued     1,000 mg Per Tube Every 12 hours 12/09/16 1701 12/12/16 1206   12/09/16 1130  amoxicillin (AMOXIL) 250 MG/5ML suspension 1,000 mg  Status:  Discontinued     1,000 mg Oral Every 12 hours 12/09/16 1117 12/09/16 1118   12/09/16 1130  amoxicillin (AMOXIL) 250 MG/5ML suspension 1,000 mg  Status:  Discontinued     1,000 mg Per Tube Every 12 hours 12/09/16 1118 12/09/16 1701   12/04/16 2200  clarithromycin (BIAXIN) tablet 500 mg     500 mg Per Tube Every 12 hours 12/04/16 1107 12/16/16 2158   12/04/16 2000  piperacillin-tazobactam (ZOSYN) IVPB 3.375 g     3.375 g 12.5 mL/hr over 240 Minutes Intravenous Every 8 hours 12/04/16 1339 12/08/16 2359   12/04/16 1600  metroNIDAZOLE (FLAGYL) tablet 500 mg  Status:  Discontinued     500 mg Per Tube Every 8 hours 12/04/16 1107 12/09/16 1117   12/02/16 1100  clarithromycin (BIAXIN) 250 MG/5ML suspension 500 mg  Status:  Discontinued     500 mg Per Tube Every 12 hours 12/02/16 1012  12/04/16 1107   12/02/16 1045  metroNIDAZOLE (FLAGYL) 50 mg/ml oral suspension 500 mg  Status:  Discontinued     500 mg Per Tube 3 times daily 12/02/16 1036 12/04/16 1107   12/02/16 1015  metroNIDAZOLE (FLAGYL) 50 mg/ml oral suspension 250 mg  Status:  Discontinued     250 mg Per Tube 4 times daily 12/02/16 1011 12/02/16 1036   11/28/16 0800  fluconazole (DIFLUCAN) IVPB 200 mg  Status:  Discontinued     200 mg 100 mL/hr over 60 Minutes Intravenous Every 24 hours 11/27/16 0624 12/02/16 1018   11/27/16 0630  fluconazole (DIFLUCAN) IVPB 400 mg     400 mg 100 mL/hr over 120 Minutes Intravenous  Once 11/27/16 0622 11/27/16 0841    11/27/16 0600  piperacillin-tazobactam (ZOSYN) IVPB 3.375 g  Status:  Discontinued     3.375 g 12.5 mL/hr over 240 Minutes Intravenous Every 8 hours 11/27/16 0534 12/04/16 1339   11/27/16 0515  piperacillin-tazobactam (ZOSYN) IVPB 3.375 g  Status:  Discontinued     3.375 g 100 mL/hr over 30 Minutes Intravenous  Once 11/27/16 0510 11/27/16 0518   11/27/16 0030  piperacillin-tazobactam (ZOSYN) IVPB 3.375 g     3.375 g 100 mL/hr over 30 Minutes Intravenous  Once 11/27/16 0016 11/27/16 0755   11/27/16 0030  vancomycin (VANCOCIN) IVPB 1000 mg/200 mL premix     1,000 mg 200 mL/hr over 60 Minutes Intravenous  Once 11/27/16 0016 11/27/16 0756      Assessment/Plan: Patient Active Problem List   Diagnosis Date Noted  . Coag negative Staphylococcus bacteremia   . Bowel perforation (HCC)   . Sepsis (HCC)   . Palliative care encounter   . Goals of care, counseling/discussion   . Poor venous access   . Pressure sore on heel, left, unstageable (HCC) 12/16/2016  . Paroxysmal A-fib (HCC) 12/12/2016  . CVA (cerebral vascular accident) (HCC) 12/12/2016  . 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 H pylori ulcer 12/12/2016  . Fatty infiltration of liver 12/12/2016  . DM (diabetes mellitus), type 2 (HCC) 12/12/2016  . Acute respiratory failure with hypoxia (HCC)   . Pneumoperitoneum 11/27/2016  . Perforated duodenal bulb ulcer (HCC) 11/27/2016  . Elevated PSA 02/07/2016  . Contact dermatitis 07/23/2015  . History of colonic polyps 07/15/2015  . Venous stasis ulcer of left lower extremity (HCC) 01/21/2015  . Left arm swelling 01/03/2015  . Dyspnea 01/03/2015  . Irregular heart beats 01/03/2015  . Wheezing 07/19/2014  . Peripheral arterial disease (HCC) 10/31/2013  . Conjunctivitis 09/22/2013  . Allergic rhinitis 09/22/2013  . ICD (implantable cardioverter-defibrillator), single, in situ 07/12/2013  . Chronic combined systolic and diastolic heart failure, NYHA class 3 (HCC) 04/10/2013  .  Nonischemic cardiomyopathy (HCC)   . Acute on chronic combined systolic and diastolic CHF (congestive heart failure) (HCC) 09/30/2012  . History of alcohol use 09/30/2012  . Back pain 10/08/2011  . 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 Preventative health care 08/17/2010  . HIP PAIN, RIGHT 11/06/2009  . ANXIETY 02/04/2009  . Diabetes (HCC) 11/07/2008  . Hyperlipidemia 11/07/2008  . 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 Essential hypertension 10/08/2008  . OSTEOARTHRITIS, KNEE, RIGHT 10/08/2008  . 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  He  will not tolerate any more operative intervention  Palliative care consult appreciated    Care discussed with daughters yesterday      LOS: 33 days    Shanieka Blea A. 12/30/2016

## 2016-12-30 NOTE — Progress Notes (Signed)
It was reported from central tele that pt had 18 beats VTach at 1849 before shift change.

## 2016-12-31 LAB — CULTURE, BLOOD (ROUTINE X 2): SPECIAL REQUESTS: ADEQUATE

## 2016-12-31 MED ORDER — APIXABAN 5 MG PO TABS
5.0000 mg | ORAL_TABLET | Freq: Two times a day (BID) | ORAL | Status: DC
  Administered 2016-12-31 – 2017-01-13 (×25): 5 mg via ORAL
  Filled 2016-12-31 (×25): qty 1

## 2016-12-31 NOTE — Progress Notes (Addendum)
PROGRESS NOTE    Jacob Pittman  OVA:[id number] DOB: [birth year] DOA: 11/26/2016 PCP: [name], MD   Brief Narrative:  Jacob Pittman is a 71 y.o. 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 Was found to have a perforated pyloric channel ulcer and pneumoperitonitis s/p omental patch repair subsequently on epinephrine and Levophed.  8/12-  TTE LV moderately dilated with EF 15% &diffuse hypokinesis. There is akinesis of the inferolateral and inferior myocardium.  E coli UTI noted 8/13-  right brachial A- line placed and then developed ischemic right arm- vascular consult and transfer from [facility] to [facility]- thrombectomy of brachial artery- heparin Paroxysmal A-fib also noted 8/14- Decreased attenuation involving the right dentate nucleus in the right cerebellum and immediately adjacent cerebellar parenchyma, concerning for recent and potentially acute infarct in this area.  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 No focal weakness.   Assessment & Plan:    Perforated duodenal bulb ulcer, Peritonitis, H. Pylori ulcer, Septic shock with Abdominal Abscess and cutaneocolonic fistula.  Patient has had a prolonged hospital course secondary to above problems. Patient has received multiple antibiotic therapies. He had evidence of a abscess tracking along sigmoid colon mesentery/omentum with drainage  through the wound, evidence of wound necrosis. S/p drain placement with cultures significant for multiple organisms. Drain not outputting much, IR removed the drain on 12/30/2016, general surgery following closely along with ID.   Discussed the case with both general surgery and ID on 12/31/2016, fistula will be left open and will be covered with equal sponge by wound care, continue oral diet speech to clear, ID will clarify final antibiotics course and duration on 12/31/2016. Patient wants to continue medical treatment but wants to be DO NOT RESUSCITATE, does not wish any further heroics, if medical treatment does not make him better he will consider hospice.   Methicillin resistant staph species bacteremia  - Seen on 9/3 blood cultures x2. IJ removed 12/24/16, infectious disease recommendations: daptomycin, repeat blood cultures obtained 12/25/16 show 1 out of 2 positive , ID to clarify further course of treatment. For now continue daptomycin.  Tachypnea - due to atelectasis along with oral secretions and possible Pulmonary edema on imaging. Improved with Lasix, currently appears slightly dehydrated hold further Lasix.  Acute on chronic systolic and diastolic heart failure - Repeat echo significant for improved EF of 25-30% from 15% with grade 2 diastolic dysfunction. Has been adequately diuresed, now sodium is trending high and urine appears extremely concentrated, hold further Lasix gentle hydration and monitor.   Paroxysmal atrial fibrillation  [place] vasc 2 score  of at least 5. continue metoprolol and for now full dose Lovenox.  Bilateral basilar atelectasis  -continue to encourage incentive spirometry  Hypernatremia - urine concentrated, hold Lasix and hydrate.     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   Arterial thrombosis  Right brachial artery. Secondary to A-line - provoked, S/p thrombectomy on 11-30-16, on  Anticoagulation.  Bilateral arm edema No DVT on ultrasound, diuresis as possible.   Fatty liver - supportive care  Pacer firing -  Read as asystole which does not appear to be the case. No arrhythmia noted. Seen by cardiology for device interrogation.  Cognitive impairment - mild delirium. Monitor.    Diabetes mellitus, type 2 A1c of 5.8%, - continue SSI  CBG (last 3)   Recent Labs  12/30/16 1534 12/30/16 1807 12/30/16 1910  GLUCAP 60* 122* 124*      DVT prophylaxis: Xarelto/Lovenox Code Status: DNR Family Communication: None at bedside. Called daughter without answer Disposition Plan: Discharge pending goals of care   Consultants:   General surgery  Interventional radiology  Palliative care medicine  Procedures:   8/10- ex lap- patch repair of pyloric ucler  8/13- brachial embolectomy  8/12- EF 15%, diffuse hypokinesis and akinesis of inferolateral/inferioir myocardium  9/6- Percutaneous drain  9/7- Echocardiogram: EF of 16-10%, grade 2 diastolic dysfunction. No evidence of vegetations   Antimicrobials:  Amoxicillin  Unasyn  Clarithromycin  Metronidazole  Zosyn  Vancomycin  Daptomycin    Objective:  Vitals:   12/31/16 0400 12/31/16 0500 12/31/16 0733 12/31/16 0800  BP: 95/63 (!) 90/47 (!) 107/56 94/73  Pulse: 66 60 62 (!) 59  Resp: (!) 25 (!) 21 (!) 26 (!) 26  Temp:   97.8 F (36.6 C)   TempSrc:   Oral   SpO2: 97% 98% 100% 99%  Weight:      Height:        Intake/Output Summary (Last 24 hours) at 12/31/16 1112 Last data filed at 12/31/16 1109  Gross per 24 hour  Intake             1750 ml  Output              530 ml  Net             1220 ml   Filed Weights   12/28/16 0407 12/29/16 0300 12/30/16 0400  Weight: 83.2 kg (183 lb 6.8 oz) 82.5 kg (181 lb 14.1 oz) 84.3 kg (185 lb 13.6 oz)    Examination:  Awake Alert, Oriented X 3, No new F.N deficits, Normal affect [place].AT,PERRAL Supple Neck,No JVD, No cervical lymphadenopathy  appriciated.  Symmetrical Chest wall movement, Good air movement bilaterally, CTAB RRR,No Gallops,Rubs or new Murmurs, No Parasternal Heave +ve B.Sounds, Abd Soft, No tenderness, No organomegaly appriciated, No rebound - guarding or rigidity. Abd bandage/binder in place, stool through fistula No Cyanosis, Clubbing or edema, No new Rash or bruise     Data Reviewed: I have personally reviewed following labs and imaging studies  CBC:  Recent Labs Lab 12/25/16 0221 12/26/16 0932 12/28/16 0534 12/30/16 0634  WBC 9.8 6.3 6.9 5.7  NEUTROABS 8.2*  --   --   --   HGB 7.3* 8.2* 9.0* 9.3*  HCT 23.5* 27.4* 30.2* 30.2*  MCV 105.9* 108.3* 107.1* 108.2*  PLT 356 284 343 960   Basic Metabolic Panel:  Recent Labs Lab 12/25/16 0221 12/26/16 0932 12/28/16 0534 12/29/16 0908 12/30/16 0352  NA 145 148* 147* 148* 146*  K 3.5 3.6 3.1* 3.7 4.4  CL 112* 117* 116* 118* 118*  CO2 22 20* 23 25 19*  GLUCOSE 90 121* 110* 78 60*  BUN 46* 30* 22* 25* 24*  CREATININE 1.35* 0.96 0.82 0.85 1.00  CALCIUM 7.4* 7.5* 7.7* 7.8* 8.0*  MG 2.3  --   --   --   --   PHOS 3.7  --   --   --   --    GFR: Estimated Creatinine Clearance: 74.4 mL/min (by C-G formula based on SCr of 1 mg/dL). Liver Function Tests:  Recent Labs Lab 12/25/16 0221 12/28/16 0534  AST 70* 39  ALT 99* 49  ALKPHOS 130* 92  BILITOT 0.9 0.6  PROT 6.2* 5.7*  ALBUMIN 1.8* 2.0*   No results for input(s): LIPASE, AMYLASE in the last 168 hours. No results for input(s): AMMONIA in the last 168 hours. Coagulation Profile: No results for input(s): INR, PROTIME in the last 168 hours. Cardiac Enzymes:  Recent Labs Lab 12/25/16 1020  CKTOTAL 48*   BNP (last 3 results) No results for input(s): PROBNP in the last 8760 hours. HbA1C: No results for input(s): HGBA1C in the last 72 hours. CBG:  Recent Labs Lab 12/30/16 0800 12/30/16 1139 12/30/16 1534 12/30/16 1807 12/30/16 1910  GLUCAP 100* 63* 60* 122* 124*   Lipid  Profile: No results for input(s): CHOL, HDL, LDLCALC, TRIG, CHOLHDL, LDLDIRECT in the last 72 hours. Thyroid Function Tests: No results for input(s): TSH, T4TOTAL, FREET4, T3FREE, THYROIDAB in the last 72 hours. Anemia Panel: No results for input(s): VITAMINB12, FOLATE, FERRITIN, TIBC, IRON, RETICCTPCT in the last 72 hours. Sepsis Labs: No results for input(s): PROCALCITON, LATICACIDVEN in the last 168 hours.  Recent Results (from the past 240 hour(s))  Culture, blood (Routine X 2) w Reflex to ID Panel     Status: Abnormal   Collection Time: 12/21/16  6:32 PM  Result Value Ref Range Status   Specimen Description BLOOD LEFT HAND  Final   Special Requests   Final    BOTTLES DRAWN AEROBIC ONLY Blood Culture adequate volume   Culture  Setup Time   Final    GRAM POSITIVE COCCI IN CLUSTERS AEROBIC BOTTLE ONLY CRITICAL RESULT CALLED TO, READ BACK BY AND VERIFIED WITH: K COOK PHARMD 0500 12/22/16 A BROWNING    Culture (A)  Final    STAPHYLOCOCCUS EPIDERMIDIS SUSCEPTIBILITIES PERFORMED ON PREVIOUS CULTURE WITHIN THE LAST 5 DAYS.    Report Status 12/25/2016 FINAL  Final  Blood Culture ID Panel (Reflexed)     Status: Abnormal   Collection Time: 12/21/16  6:32 PM  Result Value Ref Range Status   Enterococcus species NOT DETECTED NOT DETECTED Final   Listeria monocytogenes NOT DETECTED NOT DETECTED Final   Staphylococcus species DETECTED (A) NOT DETECTED Final    Comment: Methicillin (oxacillin) resistant coagulase negative staphylococcus. Possible blood culture contaminant (unless isolated from more than one blood culture draw or clinical case suggests pathogenicity). No antibiotic treatment is indicated for blood  culture contaminants. CRITICAL RESULT CALLED TO, READ BACK BY AND VERIFIED WITH: K COOK PHARMD 3601 12/22/16 A BROWNING    Staphylococcus aureus NOT DETECTED NOT DETECTED Final   Methicillin resistance DETECTED (A) NOT DETECTED Final    Comment: CRITICAL RESULT CALLED TO, READ BACK  BY AND VERIFIED WITH: K COOK PHARMD 2304 12/22/16 A BROWNING    Streptococcus species NOT DETECTED NOT DETECTED Final   Streptococcus agalactiae NOT DETECTED NOT DETECTED Final   Streptococcus pneumoniae NOT DETECTED NOT DETECTED Final   Streptococcus pyogenes NOT DETECTED NOT DETECTED Final   Acinetobacter baumannii NOT DETECTED NOT DETECTED Final   Enterobacteriaceae species NOT DETECTED NOT DETECTED Final   Enterobacter cloacae complex NOT DETECTED NOT DETECTED Final   Escherichia coli NOT DETECTED NOT DETECTED  Final   Klebsiella oxytoca NOT DETECTED NOT DETECTED Final   Klebsiella pneumoniae NOT DETECTED NOT DETECTED Final   Proteus species NOT DETECTED NOT DETECTED Final   Serratia marcescens NOT DETECTED NOT DETECTED Final   Haemophilus influenzae NOT DETECTED NOT DETECTED Final   Neisseria meningitidis NOT DETECTED NOT DETECTED Final   Pseudomonas aeruginosa NOT DETECTED NOT DETECTED Final   Candida albicans NOT DETECTED NOT DETECTED Final   Candida glabrata NOT DETECTED NOT DETECTED Final   Candida krusei NOT DETECTED NOT DETECTED Final   Candida parapsilosis NOT DETECTED NOT DETECTED Final   Candida tropicalis NOT DETECTED NOT DETECTED Final  Culture, blood (Routine X 2) w Reflex to ID Panel     Status: Abnormal   Collection Time: 12/21/16  6:38 PM  Result Value Ref Range Status   Specimen Description BLOOD RIGHT ARM  Final   Special Requests IN PEDIATRIC BOTTLE Blood Culture adequate volume  Final   Culture  Setup Time   Final    GRAM POSITIVE COCCI IN CLUSTERS IN PEDIATRIC BOTTLE CRITICAL VALUE NOTED.  VALUE IS CONSISTENT WITH PREVIOUSLY REPORTED AND CALLED VALUE.    Culture STAPHYLOCOCCUS EPIDERMIDIS (A)  Final   Report Status 12/25/2016 FINAL  Final   Organism ID, Bacteria STAPHYLOCOCCUS EPIDERMIDIS  Final      Susceptibility   Staphylococcus epidermidis - MIC*    CIPROFLOXACIN >=8 RESISTANT Resistant     ERYTHROMYCIN >=8 RESISTANT Resistant     GENTAMICIN 8  INTERMEDIATE Intermediate     OXACILLIN RESISTANT Resistant     TETRACYCLINE 2 SENSITIVE Sensitive     VANCOMYCIN 4 SENSITIVE Sensitive     TRIMETH/SULFA 160 RESISTANT Resistant     CLINDAMYCIN >=8 RESISTANT Resistant     RIFAMPIN <=0.5 SENSITIVE Sensitive     Inducible Clindamycin NEGATIVE Sensitive     * STAPHYLOCOCCUS EPIDERMIDIS  Surgical pcr screen     Status: None   Collection Time: 12/21/16  9:33 PM  Result Value Ref Range Status   MRSA, PCR NEGATIVE NEGATIVE Final   Staphylococcus aureus NEGATIVE NEGATIVE Final    Comment: (NOTE) The Xpert SA Assay (FDA approved for NASAL specimens in patients 49 years of age and older), is one component of a comprehensive surveillance program. It is not intended to diagnose infection nor to guide or monitor treatment.   Body fluid culture     Status: Abnormal   Collection Time: 12/24/16 12:13 PM  Result Value Ref Range Status   Specimen Description FLUID  Final   Special Requests ASCITIES  Final   Gram Stain   Final    RARE WBC PRESENT,BOTH PMN AND MONONUCLEAR NO ORGANISMS SEEN    Culture (A)  Final    MULTIPLE ORGANISMS PRESENT, NONE PREDOMINANT CRITICAL RESULT CALLED TO, READ BACK BY AND VERIFIED WITH: A. DAVIS RN, AT 1660 12/25/16 BY D. VANHOOK REGARDING CULTURE GROWTH    Report Status 12/26/2016 FINAL  Final  Culture, blood (routine x 2)     Status: None   Collection Time: 12/25/16  2:21 AM  Result Value Ref Range Status   Specimen Description BLOOD LEFT HAND  Final   Special Requests IN PEDIATRIC BOTTLE Blood Culture adequate volume  Final   Culture NO GROWTH 5 DAYS  Final   Report Status 12/30/2016 FINAL  Final  Culture, blood (routine x 2)     Status: None (Preliminary result)   Collection Time: 12/25/16  2:35 AM  Result Value Ref Range Status   Specimen Description  BLOOD LEFT WRIST  Final   Special Requests IN PEDIATRIC BOTTLE Blood Culture adequate volume  Final   Culture  Setup Time   Final    GRAM POSITIVE COCCI IN  CLUSTERS IN PEDIATRIC BOTTLE CRITICAL RESULT CALLED TO, READ BACK BY AND VERIFIED WITH: V.BRYK PHARMD 12/30/16 0342 L.CHAMPION    Culture   Final    GRAM POSITIVE COCCI IN CLUSTERS IDENTIFICATION TO FOLLOW    Report Status PENDING  Incomplete  Blood Culture ID Panel (Reflexed)     Status: Abnormal   Collection Time: 12/25/16  2:35 AM  Result Value Ref Range Status   Enterococcus species NOT DETECTED NOT DETECTED Final   Vancomycin resistance NOT DETECTED NOT DETECTED Final   Listeria monocytogenes NOT DETECTED NOT DETECTED Final   Staphylococcus species DETECTED (A) NOT DETECTED Final    Comment: CRITICAL RESULT CALLED TO, READ BACK BY AND VERIFIED WITH: V.BRYK PHARMD 12/30/16 0342 L.CHAMPION    Staphylococcus aureus NOT DETECTED NOT DETECTED Final   Methicillin resistance DETECTED (A) NOT DETECTED Final    Comment: CRITICAL RESULT CALLED TO, READ BACK BY AND VERIFIED WITH: V.BRYK PHARMD 12/30/16 0342 L.CHAMPION    Streptococcus species NOT DETECTED NOT DETECTED Final   Streptococcus agalactiae NOT DETECTED NOT DETECTED Final   Streptococcus pneumoniae NOT DETECTED NOT DETECTED Final   Streptococcus pyogenes NOT DETECTED NOT DETECTED Final   Acinetobacter baumannii NOT DETECTED NOT DETECTED Final   Enterobacteriaceae species NOT DETECTED NOT DETECTED Final   Enterobacter cloacae complex NOT DETECTED NOT DETECTED Final   Escherichia coli NOT DETECTED NOT DETECTED Final   Klebsiella oxytoca NOT DETECTED NOT DETECTED Final   Klebsiella pneumoniae NOT DETECTED NOT DETECTED Final   Proteus species NOT DETECTED NOT DETECTED Final   Serratia marcescens NOT DETECTED NOT DETECTED Final   Carbapenem resistance NOT DETECTED NOT DETECTED Final   Haemophilus influenzae NOT DETECTED NOT DETECTED Final   Neisseria meningitidis NOT DETECTED NOT DETECTED Final   Pseudomonas aeruginosa NOT DETECTED NOT DETECTED Final   Candida albicans NOT DETECTED NOT DETECTED Final   Candida glabrata NOT  DETECTED NOT DETECTED Final   Candida krusei NOT DETECTED NOT DETECTED Final   Candida parapsilosis NOT DETECTED NOT DETECTED Final   Candida tropicalis NOT DETECTED NOT DETECTED Final     Radiology Studies: Ct Abdomen Pelvis W Contrast  Result Date: 12/29/2016 CLINICAL DATA:  Abdominal infection including peritonitis. Repair of perforated pyloric channel ulcer, 11/27/2016. EXAM: CT ABDOMEN AND PELVIS WITH CONTRAST TECHNIQUE: Multidetector CT imaging of the abdomen and pelvis was performed using the standard protocol following bolus administration of intravenous contrast. CONTRAST:  135m ISOVUE-300 IOPAMIDOL (ISOVUE-300) INJECTION 61% COMPARISON:  12/24/2016, 12/21/2016, 12/13/2016 CT FINDINGS: Lower chest: Stable cardiomegaly without pericardial effusion. ICD lead noted in the right ventricle. Moderate right and small left pleural effusions are present with adjacent compressive atelectasis, slightly increased on the left since 12/21/2016. Hepatobiliary: 1 cm subcapsular hypodense lesion in the right hepatic lobe, series 3, image 22, stable in appearance, more commonly associated with a cyst or hemangioma. No biliary dilatation. Physiologic distention of the gallbladder without calculus or secondary signs of acute cholecystitis. Pancreas: Unremarkable Spleen: Unremarkable Adrenals/Urinary Tract: Stable bilateral renal cysts. No obstructive uropathy. Normal bilateral adrenal glands. No hydroureteronephrosis. The urinary bladder is physiologically distended. Stomach/Bowel: A feeding tube is seen within the distal duodenum terminating near the ligament of Treitz. Stomach is physiologically distended with enteric contrast. No bowel obstruction or inflammation. Again noted are a few dots of gas  along the gastrohepatic ligament without significant worsening. Enteric contrast reaches the rectum. Ventral midline abdominal wound is noted with serpiginous extraluminal contrast noted tracking through the anterior  abdominal wall and mesentery raising concern for a cutaneous fistula with the sigmoid colon. Interval drainage via percutaneous pigtail catheter of the fluid collection within the lower abdomen. The tip of the pigtail catheter seen in the midline of the upper pelvis with small residual volume of fluid/abscess seen measuring approximately 4.9 x 2.5 x 2.5 cm. Vascular/Lymphatic: Moderate aortoiliac and branch vessel atherosclerosis. No aneurysm. No lymphadenopathy. Reproductive: Unremarkable Other: Percutaneous drainage pigtail catheter is noted knee left lower quadrant approach. Mild third spacing of fluid in the subcutaneous and mesenteric soft tissues. Musculoskeletal: Thoracolumbar spondylosis. Lumbosacral transitional vertebral anatomy. IMPRESSION: 1. Interval near complete decompression of previously noted abscess cavity status post percutaneous drainage catheter placement via left lower quadrant approach. A small amount of residual fluid persist measuring 4.9 x 2.5 x 2.5 cm to the right of the catheter. 2. 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 3. Slight increase small left effusion. 4. Several small loculations of gas are again seen along the gastrohepatic ligament without significant change. 5. 1 cm fluid attenuating structure along the posterior right hepatic lobe margin, nonspecific but stable in appearance. 6. Moderate cardiomegaly with bilateral right greater than left pleural effusions with compressive atelectasis. 7. Feeding tube noted within the distal duodenum. 8. Mild third spacing of fluid. Electronically Signed   By: Ashley Royalty M.D.   On: 12/29/2016 15:49     Scheduled Meds: . aspirin  81 mg Per Tube Daily  . chlorhexidine gluconate (MEDLINE KIT)  15 mL Mouth Rinse BID  . enoxaparin (LOVENOX) injection  85 mg Subcutaneous Q12H  . lidocaine (PF)  5 mL  Intradermal Once  . mouth rinse  15 mL Mouth Rinse QID  . midodrine  2.5 mg Oral TID WC  . pantoprazole (PROTONIX) IV  40 mg Intravenous Q12H  . sodium chloride flush  5 mL Intravenous Q8H   Continuous Infusions: . sodium chloride Stopped (12/14/16 0953)  . sodium chloride 10 mL/hr at 12/27/16 0040  . DAPTOmycin (CUBICIN)  IV Stopped (12/30/16 1540)  . dextrose 50 mL/hr at 12/31/16 0800  . fluconazole (DIFLUCAN) IV Stopped (12/30/16 1740)  . piperacillin-tazobactam (ZOSYN)  IV 3.375 g (12/31/16 1027)     LOS: 34 days   Signature  Lala Lund M.D on 12/31/2016 at 11:12 AM  Between 7am to 7pm - Pager - [phone] ( page via [email or web address], text pages only, please mention full 10 digit call back number).  After 7pm go to [email or web address] - password [facility]

## 2016-12-31 NOTE — Progress Notes (Signed)
Central telemetry called to report patient had a run of SVT.  Patient was asleep.  States, "I didn't feel nothing."  BP 92/55, HR 75 R 20 oxygen sat is 99% on room air.  Family at the bedside, will continue to monitor.

## 2016-12-31 NOTE — Progress Notes (Signed)
ANTICOAGULATION CONSULT NOTE - Initial Consult  Pharmacy Consult for Eliquis Indication: atrial fibrillation  No Known Allergies  Patient Measurements: Height: 6\' 1"  (185.4 cm) Weight: 185 lb 13.6 oz (84.3 kg) IBW/kg (Calculated) : 79.9   Vital Signs: Temp: 97.9 F (36.6 C) (09/13 1153) Temp Source: Oral (09/13 1153) BP: 99/67 (09/13 1153) Pulse Rate: 61 (09/13 1153)  Labs:  Recent Labs  12/29/16 0908 12/30/16 0352 12/30/16 0634  HGB  --   --  9.3*  HCT  --   --  30.2*  PLT  --   --  304  CREATININE 0.85 1.00  --     Estimated Creatinine Clearance: 74.4 mL/min (by C-G formula based on SCr of 1 mg/dL).   Medical History: Past Medical History:  Diagnosis Date  . ANXIETY 02/04/2009  . CHF (congestive heart failure) (HCC)   . Chronic systolic CHF (congestive heart failure) (HCC)   . DIABETES MELLITUS, TYPE II 11/07/2008  . 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 HIP PAIN, RIGHT 11/06/2009  . HYPERLIPIDEMIA 11/07/2008  . HYPERTENSION 10/08/2008  . Nonischemic cardiomyopathy [facility])    s/p ICD implantation 03-2013 by Dr Ladona Ridgel  . OSTEOARTHRITIS, KNEE, RIGHT 10/08/2008  . Peripheral arterial disease (HCC)     Assessment:  Anticoag: Afib - Transitioned from IV heparin back to Xarelto for afib on 8/26through 9/6. To Lovenox on 9/6 as pt NPO  Hgb 9.3, plts wnl. No bleeding noted. - August 13 (?): Clot from a-line s/p thrombectomy - PM: Transition Lovenox>>Eliquis (discussed with Dr. Thedore Mins), 71 y/o, 84 kg, CrCl 74 afib dose ok  Goal of Therapy:  Therapeutic oral anticoagulation Monitor platelets by anticoagulation protocol: Yes   Plan:  Eliquis 5mg  BID  Shaya Reddick S. Merilynn Finland, PharmD, BCPS Clinical Staff Pharmacist Pager [phone]  Misty Stanley Stillinger 12/31/2016,3:11 PM

## 2016-12-31 NOTE — Consult Note (Signed)
WOC Nurse wound consult note Reason for Consult: placement of Eakin pouch to manage new EC fistula Wound type: surgical wound with EC fistula at the distal end Pressure Injury POA:NA Wound bed: 80% pink, 20% pink in wound bed Drainage (amount, consistency, odor) green effluent with stool odor at distal end, fistula stomatized  Periwound: intact  Dressing procedure/placement/frequency: Removed old dressing, cleaned and dried skin around wound Pattern created for fistula pouch. Cut large Eakin pouch to fit wound, used extra barrier in horseshoe shape along the distal edge of the wound bed and left and right edges of the wound. Placed new Eakin pouch. Placed to suction for a short time to aid in seal.  Removed suction, explained to bedside nursing staff how to manage. Explained use of Eakin pouch to patient's family.  Patient is confused but pleasant.  WOC will follow along for weekly Eakin pouch changes and frequent assessment of pouch integrity Katheryn Culliton [facility], CNS, CWON-AP [phone]

## 2016-12-31 NOTE — Progress Notes (Signed)
31 Days Post-Op   Subjective/Chief Complaint: IN BETTER SPIRITS ABLE TO EAT    Objective: Vital signs in last 24 hours: Temp:  [96.9 F (36.1 C)-98.3 F (36.8 C)] 97.8 F (36.6 C) (09/13 0733) Pulse Rate:  [39-81] 59 (09/13 0800) Resp:  [14-32] 26 (09/13 0800) BP: (60-114)/(27-73) 94/73 (09/13 0800) SpO2:  [96 %-100 %] 99 % (09/13 0800) Last BM Date: 12/30/16  Intake/Output from previous day: 09/12 0701 - 09/13 0700 In: 1050 [P.O.:150; I.V.:600; NG/GT:50; IV Piggyback:250] Out: 480 [Urine:480] Intake/Output this shift: Total I/O In: 700 [I.V.:600; IV Piggyback:100] Out: -   Incision/Wound:wound open with fistula inferior  Portion   Lab Results:   Recent Labs  12/30/16 0634  WBC 5.7  HGB 9.3*  HCT 30.2*  PLT 304   BMET  Recent Labs  12/29/16 0908 12/30/16 0352  NA 148* 146*  K 3.7 4.4  CL 118* 118*  CO2 25 19*  GLUCOSE 78 60*  BUN 25* 24*  CREATININE 0.85 1.00  CALCIUM 7.8* 8.0*   PT/INR No results for input(s): LABPROT, INR in the last 72 hours. ABG No results for input(s): PHART, HCO3 in the last 72 hours.  Invalid input(s): PCO2, PO2  Studies/Results: Ct Abdomen Pelvis W Contrast  Result Date: 12/29/2016 CLINICAL DATA:  Abdominal infection including peritonitis. Repair of perforated pyloric channel ulcer, 11/27/2016. EXAM: CT ABDOMEN AND PELVIS WITH CONTRAST TECHNIQUE: Multidetector CT imaging of the abdomen and pelvis was performed using the standard protocol following bolus administration of intravenous contrast. CONTRAST:  ISOVUE-300 IOPAMIDOL (ISOVUE-300) INJECTION 61% COMPARISON:  12/24/2016, 12/21/2016, 12/13/2016 CT FINDINGS: Lower chest: Stable cardiomegaly without pericardial effusion. ICD lead noted in the right ventricle. Moderate right and small left pleural effusions are present with adjacent compressive atelectasis, slightly increased on the left since 12/21/2016. Hepatobiliary: 1 cm subcapsular hypodense lesion in the right  hepatic lobe, series 3, image 22, stable in appearance, more commonly associated with a cyst or hemangioma. No biliary dilatation. Physiologic distention of the gallbladder without calculus or secondary signs of acute cholecystitis. Pancreas: Unremarkable Spleen: Unremarkable Adrenals/Urinary Tract: Stable bilateral renal cysts. No obstructive uropathy. Normal bilateral adrenal glands. No hydroureteronephrosis. The urinary bladder is physiologically distended. Stomach/Bowel: A feeding tube is seen within the distal duodenum terminating near the ligament of Treitz. Stomach is physiologically distended with enteric contrast. No bowel obstruction or inflammation. Again noted are a few dots of gas along the gastrohepatic ligament without significant worsening. Enteric contrast reaches the rectum. 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 Interval drainage via percutaneous pigtail catheter of the fluid collection within the lower abdomen. The tip of the pigtail catheter seen in the midline of the upper pelvis with small residual volume of fluid/abscess seen measuring approximately 4.9 x 2.5 x 2.5 cm. Vascular/Lymphatic: Moderate aortoiliac and branch vessel atherosclerosis. No aneurysm. No lymphadenopathy. Reproductive: Unremarkable Other: Percutaneous drainage pigtail catheter is noted knee left lower quadrant approach. Mild third spacing of fluid in the subcutaneous and mesenteric soft tissues. Musculoskeletal: Thoracolumbar spondylosis. Lumbosacral transitional vertebral anatomy. IMPRESSION: 1. Interval near complete decompression of previously noted abscess cavity status post percutaneous drainage catheter placement via left lower quadrant approach. A small amount of residual fluid persist measuring 4.9 x 2.5 x 2.5 cm to the right of the catheter. 2.  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 3. Slight increase small left effusion. 4. Several small loculations of gas are again seen along the gastrohepatic ligament without significant change. 5. 1 cm fluid attenuating structure along the posterior right hepatic lobe margin, nonspecific but stable in appearance. 6. Moderate cardiomegaly with bilateral right greater than left pleural effusions with compressive atelectasis. 7. Feeding tube noted within the distal duodenum. 8. Mild third spacing of fluid. Electronically Signed   By: Tollie Eth M.D.   On: 12/29/2016 15:49    Anti-infectives: Anti-infectives    Start     Dose/Rate Route Frequency Ordered Stop   12/30/16 1230  fluconazole (DIFLUCAN) IVPB 200 mg     200 mg 100 mL/hr over 60 Minutes Intravenous Every 24 hours 12/30/16 1127     12/25/16 1600  DAPTOmycin (CUBICIN) 500 mg in sodium chloride 0.9 % IVPB     500 mg 220 mL/hr over 30 Minutes Intravenous Every 24 hours 12/25/16 1002     12/25/16 1300  DAPTOmycin (CUBICIN) 500 mg in sodium chloride 0.9 % IVPB  Status:  Discontinued     500 mg 220 mL/hr over 30 Minutes Intravenous Every 24 hours 12/25/16 0956 12/25/16 1002   12/23/16 1800  vancomycin (VANCOCIN) IVPB 750 mg/150 ml premix  Status:  Discontinued     750 mg 150 mL/hr over 60 Minutes Intravenous Every 12 hours 12/23/16 0310 12/25/16 0941   12/23/16 0145  vancomycin (VANCOCIN) 1,750 mg in sodium chloride 0.9 % 500 mL IVPB     1,750 mg 250 mL/hr over 120 Minutes Intravenous  Once 12/23/16 0138 12/23/16 0353   12/21/16 1500  piperacillin-tazobactam (ZOSYN) IVPB 3.375 g     3.375 g 12.5 mL/hr over 240 Minutes Intravenous Every 8 hours 12/21/16 1456     12/13/16 1000  amoxicillin (AMOXIL) 250 MG/5ML suspension 1,000 mg     1,000 mg Per Tube Every 12 hours 12/13/16 0935 12/16/16 1833   12/12/16 1900   piperacillin-tazobactam (ZOSYN) IVPB 3.375 g  Status:  Discontinued     3.375 g 12.5 mL/hr over 240 Minutes Intravenous Every 8 hours 12/12/16 1759 12/13/16 0911   12/12/16 1400  Ampicillin-Sulbactam (UNASYN) 3 g in sodium chloride 0.9 % 100 mL IVPB  Status:  Discontinued     3 g 200 mL/hr over 30 Minutes Intravenous Every 6 hours 12/12/16 1215 12/12/16 1753   12/10/16 0600  amoxicillin (AMOXIL) 250 MG/5ML suspension 1,000 mg  Status:  Discontinued     1,000 mg Per Tube Every 12 hours 12/09/16 1701 12/12/16 1206   12/09/16 1130  amoxicillin (AMOXIL) 250 MG/5ML suspension 1,000 mg  Status:  Discontinued     1,000 mg Oral Every 12 hours 12/09/16 1117 12/09/16 1118   12/09/16 1130  amoxicillin (AMOXIL) 250 MG/5ML suspension 1,000 mg  Status:  Discontinued     1,000 mg Per Tube Every 12 hours 12/09/16 1118 12/09/16 1701   12/04/16 2200  clarithromycin (BIAXIN) tablet 500 mg     500 mg Per Tube Every 12 hours 12/04/16 1107 12/16/16 2158   12/04/16 2000  piperacillin-tazobactam (ZOSYN) IVPB 3.375 g     3.375 g 12.5 mL/hr over 240 Minutes Intravenous Every 8 hours 12/04/16 1339 12/08/16 2359   12/04/16 1600  metroNIDAZOLE (FLAGYL) tablet 500 mg  Status:  Discontinued     500 mg Per Tube Every 8 hours 12/04/16 1107 12/09/16 1117   12/02/16 1100  clarithromycin (BIAXIN) 250 MG/5ML suspension 500 mg  Status:  Discontinued     500 mg Per Tube Every 12 hours 12/02/16 1012 12/04/16 1107   12/02/16 1045  metroNIDAZOLE (FLAGYL) 50 mg/ml oral suspension 500 mg  Status:  Discontinued     500 mg Per Tube 3 times daily 12/02/16 1036 12/04/16 1107   12/02/16 1015  metroNIDAZOLE (FLAGYL) 50 mg/ml oral suspension 250 mg  Status:  Discontinued     250 mg Per Tube 4 times daily 12/02/16 1011 12/02/16 1036   11/28/16 0800  fluconazole (DIFLUCAN) IVPB 200 mg  Status:  Discontinued     200 mg 100 mL/hr over 60 Minutes Intravenous Every 24 hours 11/27/16 0624 12/02/16 1018   11/27/16 0630  fluconazole (DIFLUCAN)  IVPB 400 mg     400 mg 100 mL/hr over 120 Minutes Intravenous  Once 11/27/16 0622 11/27/16 0841   11/27/16 0600  piperacillin-tazobactam (ZOSYN) IVPB 3.375 g  Status:  Discontinued     3.375 g 12.5 mL/hr over 240 Minutes Intravenous Every 8 hours 11/27/16 0534 12/04/16 1339   11/27/16 0515  piperacillin-tazobactam (ZOSYN) IVPB 3.375 g  Status:  Discontinued     3.375 g 100 mL/hr over 30 Minutes Intravenous  Once 11/27/16 0510 11/27/16 0518   11/27/16 0030  piperacillin-tazobactam (ZOSYN) IVPB 3.375 g     3.375 g 100 mL/hr over 30 Minutes Intravenous  Once 11/27/16 0016 11/27/16 0755   11/27/16 0030  vancomycin (VANCOCIN) IVPB 1000 mg/200 mL premix     1,000 mg 200 mL/hr over 60 Minutes Intravenous  Once 11/27/16 0016 11/27/16 0756      Assessment/Plan: Patient Active Problem List   Diagnosis Date Noted  . Coag negative Staphylococcus bacteremia   . Bowel perforation (HCC)   . Sepsis (HCC)   . Palliative care encounter   . Goals of care, counseling/discussion   . Poor venous access   . Pressure sore on heel, left, unstageable (HCC) 12/16/2016  . Paroxysmal A-fib (HCC) 12/12/2016  . CVA (cerebral vascular accident) (HCC) 12/12/2016  . 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 H pylori ulcer 12/12/2016  . Fatty infiltration of liver 12/12/2016  . DM (diabetes mellitus), type 2 (HCC) 12/12/2016  . Acute respiratory failure with hypoxia (HCC)   . Pneumoperitoneum 11/27/2016  . Perforated duodenal bulb ulcer (HCC) 11/27/2016  . Elevated PSA 02/07/2016  . Contact dermatitis 07/23/2015  . History of colonic polyps 07/15/2015  . Venous stasis ulcer of left lower extremity (HCC) 01/21/2015  . Left arm swelling 01/03/2015  . Dyspnea 01/03/2015  . Irregular heart beats 01/03/2015  . Wheezing 07/19/2014  . Peripheral arterial disease (HCC) 10/31/2013  . Conjunctivitis 09/22/2013  . Allergic rhinitis 09/22/2013  . ICD (implantable cardioverter-defibrillator), single, in situ  07/12/2013  . Chronic combined systolic and diastolic heart failure, NYHA class 3 (HCC) 04/10/2013  . Nonischemic cardiomyopathy (HCC)   . Acute on chronic combined systolic and diastolic CHF (congestive heart failure) (HCC) 09/30/2012  . History of alcohol use 09/30/2012  . Back pain 10/08/2011  . 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 Preventative health care 08/17/2010  . HIP PAIN, RIGHT 11/06/2009  . ANXIETY 02/04/2009  . Diabetes (HCC) 11/07/2008  . Hyperlipidemia 11/07/2008  . 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 Essential hypertension 10/08/2008  . OSTEOARTHRITIS, KNEE, RIGHT 10/08/2008  . FATIGUE 10/08/2008    Agree with comfort as main goal Ok to [employer] pouch over wound and treat as colostomy   No TNA so no need for PICC at this point  LOS: 34 days    Shannan Slinker A. 12/31/2016

## 2016-12-31 NOTE — Progress Notes (Signed)
ANTICOAGULATION/ANTIBIOTIC CONSULT NOTE - Follow Up Consult  Pharmacy Consult for Lovenox and Daptomycin Indication: atrial fibrillation and MRSE bacteremia  No Known Allergies  Patient Measurements: Height: 6\' 1"  (185.4 cm) Weight: 185 lb 13.6 oz (84.3 kg) IBW/kg (Calculated) : 79.9  Vital Signs: Temp: 97.8 F (36.6 C) (09/13 0733) Temp Source: Oral (09/13 0733) BP: 107/56 (09/13 0733) Pulse Rate: 62 (09/13 0733)  Labs:  Recent Labs  12/29/16 0908 12/30/16 0352 12/30/16 0634  HGB  --   --  9.3*  HCT  --   --  30.2*  PLT  --   --  304  CREATININE 0.85 1.00  --     Estimated Creatinine Clearance: 74.4 mL/min (by C-G formula based on SCr of 1 mg/dL).  Assessment: 71 yo M with hx of Afib. On Xarelto PTA and then transitioned to heparin. On 8/26 was switched back to Xarelto but then had clinical worsening and is now on Lovenox. Patient is NPO and was started on TPN but currently being held. CBC/renal function stable.   He also continues on daptomycin for MRSE bacteremia and zosyn for IAA. Baseline CK 48.   9/7 Daptomycin >> 8/10 Zosyn >> 8/22, 8/25>>8/26, 9/3 >>  8/10 Vancomycin x1, 9/5 >> 9/7 8/10 Diflucan >>8/15 8/15 Flagyl >>8/22 8/15 Biaxin >> 8/29 8/22 Amoxicillin>>8/25, 8/26 >> 8/29 8/25 Unasyn >>8/25 -On BID PPI  8/10 BCx: negative 8/10 Abd wound: normal skin flora 8/10 MRSA PCR: neg 8/9 BCx: neg 8/9 UCx: > 100K E coli R to Unaysn/amp, sens to all others 8/10 BCx: Neg 8/17 C. Diff: negative 8/18 Resp Cx: normal flora 8/17 UCx: neg 8/17 BCx: neg 8/23 BCx: neg 8/29 BCx: ngtd 9/3 BCx: MRSE in 2/4  9/6 ascitic fluid: neg 9/7 BCx >> ngtd   Goal of Therapy:  Anti-Xa level 0.6-1 units/ml 4hrs after LMWH dose given Monitor platelets by anticoagulation protocol: Yes   Plan:  1) Continue lovenox 85mg  sq q12 2) Continue daptomycin 500mg  IV q24 (~ 6mg /kg q24) 3) Check weekly CK (qFriday) 4) Continue zosyn 3.375g IV q8 (EI) per MD   Pollyann Samples,  PharmD, BCPS 12/31/2016, 8:02 AM

## 2016-12-31 NOTE — Progress Notes (Signed)
Physical Therapy Treatment Patient Details Name: Jacob Pittman MRN: [id number] DOB: Jan 25, 1946 Today's Date: 12/31/2016    History of Present Illness Patient is a 71 yo male admitted 11/26/16 with perforated duodenal bulb ulcer, now s/p exploratory lap and repair on 11/27/16. Postoperatively remained in shock requiring epinephrine and Levophed drip. Also complicated by ischemic RUE and is s/p embolectomy.  Patient was slow to wean from vent, extubated on 12/10/16.  Patient with confusion.     PMH:   ICM, DM, HTN, PAD, OSA, CHF, EF 15%, ICD, PAF, OA    PT Comments    Patient seen for reassessment and mobility progression. Current goals and discharge recommendations remain appropriate. Patient much more alert today and tolerated EOB and OOB to chair activity. Will continue to see and progress as tolerated. Will need ST SNF upon acute discharge.    Follow Up Recommendations  SNF;Supervision/Assistance - 24 hour     Equipment Recommendations  Other (comment) (TBA)    Recommendations for Other Services       Precautions / Restrictions Precautions Precautions: Fall Precaution Comments: NG tube; feeding tube; Jp drain Restrictions Weight Bearing Restrictions: No    Mobility  Bed Mobility Overal bed mobility: Needs Assistance Bed Mobility: Rolling;Supine to Sit Rolling: Max assist;+2 for physical assistance   Supine to sit: Max assist;+2 for physical assistance     General bed mobility comments: VCs for positioning and techniqe, max assist to come to EOB (+2); significant posterior lean despite multi modal cues  Transfers Overall transfer level: Needs assistance   Transfers: Lateral/Scoot Transfers          Lateral/Scoot Transfers: +2 physical assistance;Max assist General transfer comment: Lateral scoot to drop arm recliner with +2 assist using chuck pad. Patient able to use arms to help position  Ambulation/Gait                 Stairs            Wheelchair  Mobility    Modified Rankin (Stroke Patients Only)       Balance Overall balance assessment: Needs assistance Sitting-balance support: Bilateral upper extremity supported;Feet supported Sitting balance-Leahy Scale: Poor Sitting balance - Comments: Continues to required physical assist to maintain upright balance at EOB Postural control: Posterior lean                                  Cognition Arousal/Alertness: Awake/alert Behavior During Therapy: WFL for tasks assessed/performed Overall Cognitive Status: No family/caregiver present to determine baseline cognitive functioning                                        Exercises General Exercises - Lower Extremity Ankle Circles/Pumps: AROM;Both;5 reps;Seated Hip Flexion/Marching: AAROM;Both;5 reps;Seated    General Comments        Pertinent Vitals/Pain Pain Assessment: Faces Faces Pain Scale: Hurts little more Pain Location: abdomen with movement Pain Descriptors / Indicators: Grimacing;Guarding Pain Intervention(s): Monitored during session    Home Living                      Prior Function            PT Goals (current goals can now be found in the care plan section) Acute Rehab PT Goals Patient Stated Goal: Unable to state PT Goal Formulation:  Patient unable to participate in goal setting Time For Goal Achievement: 12/26/16 Potential to Achieve Goals: Fair Progress towards PT goals: Progressing toward goals    Frequency    Min 2X/week      PT Plan Current plan remains appropriate    Co-evaluation              AM-PAC PT "6 Clicks" Daily Activity  Outcome Measure  Difficulty turning over in bed (including adjusting bedclothes, sheets and blankets)?: Unable Difficulty moving from lying on back to sitting on the side of the bed? : Unable Difficulty sitting down on and standing up from a chair with arms (e.g., wheelchair, bedside commode, etc,.)?: Unable Help  needed moving to and from a bed to chair (including a wheelchair)?: Total Help needed walking in hospital room?: Total Help needed climbing 3-5 steps with a railing? : Total 6 Click Score: 6    End of Session Equipment Utilized During Treatment: Oxygen Activity Tolerance: Patient limited by fatigue;Patient limited by lethargy Patient left: in bed;with call bell/phone within reach;Other (comment) (prevalon boots) Nurse Communication: Mobility status;Need for lift equipment PT Visit Diagnosis: Muscle weakness (generalized) (M62.81);Difficulty in walking, not elsewhere classified (R26.2)     Time: 1610-9604 PT Time Calculation (min) (ACUTE ONLY): 28 min  Charges:  $Therapeutic Activity: 23-37 mins                    G Codes:       Charlotte Crumb, PT DPT  Board Certified Neurologic Specialist [phone]    Fabio Asa 12/31/2016, 5:01 PM

## 2017-01-01 LAB — CBC
HCT: 31.6 % — ABNORMAL LOW (ref 39.0–52.0)
Hemoglobin: 10.2 g/dL — ABNORMAL LOW (ref 13.0–17.0)
MCH: 34 pg (ref 26.0–34.0)
MCHC: 32.3 g/dL (ref 30.0–36.0)
MCV: 105.3 fL — AB (ref 78.0–100.0)
PLATELETS: 242 10*3/uL (ref 150–400)
RBC: 3 MIL/uL — AB (ref 4.22–5.81)
RDW: 16.7 % — ABNORMAL HIGH (ref 11.5–15.5)
WBC: 4.2 10*3/uL (ref 4.0–10.5)

## 2017-01-01 LAB — BASIC METABOLIC PANEL
Anion gap: 8 (ref 5–15)
BUN: 10 mg/dL (ref 6–20)
CHLORIDE: 108 mmol/L (ref 101–111)
CO2: 21 mmol/L — AB (ref 22–32)
CREATININE: 0.75 mg/dL (ref 0.61–1.24)
Calcium: 7.7 mg/dL — ABNORMAL LOW (ref 8.9–10.3)
GFR calc non Af Amer: >60 mL/min (ref >60)
Glucose, Bld: 76 mg/dL (ref 65–99)
Potassium: 3.6 mmol/L (ref 3.5–5.1)
Sodium: 137 mmol/L (ref 135–145)

## 2017-01-01 LAB — GLUCOSE, CAPILLARY
Glucose-Capillary: 73 mg/dL (ref 65–99)
Glucose-Capillary: 64 mg/dL — ABNORMAL LOW (ref 65–99)
Glucose-Capillary: 97 mg/dL (ref 65–99)

## 2017-01-01 LAB — CK: Total CK: 49 U/L (ref 49–397)

## 2017-01-01 MED ORDER — PANTOPRAZOLE SODIUM 40 MG PO TBEC
40.0000 mg | DELAYED_RELEASE_TABLET | Freq: Two times a day (BID) | ORAL | Status: DC
  Administered 2017-01-01 – 2017-01-13 (×24): 40 mg via ORAL
  Filled 2017-01-01 (×24): qty 1

## 2017-01-01 MED ORDER — DOXYCYCLINE HYCLATE 100 MG PO TABS
100.0000 mg | ORAL_TABLET | Freq: Two times a day (BID) | ORAL | Status: DC
  Administered 2017-01-01 – 2017-01-13 (×25): 100 mg via ORAL
  Filled 2017-01-01 (×25): qty 1

## 2017-01-01 MED ORDER — COLLAGENASE 250 UNIT/GM EX OINT
TOPICAL_OINTMENT | Freq: Every day | CUTANEOUS | Status: DC
  Administered 2017-01-01 – 2017-01-02 (×2): 1 via TOPICAL
  Administered 2017-01-03 – 2017-01-06 (×4): via TOPICAL
  Administered 2017-01-08: 1 via TOPICAL
  Administered 2017-01-09 – 2017-01-10 (×3): via TOPICAL
  Administered 2017-01-11: 1 via TOPICAL
  Administered 2017-01-12 – 2017-01-13 (×2): via TOPICAL
  Filled 2017-01-01 (×2): qty 30

## 2017-01-01 MED ORDER — DEXTROSE 50 % IV SOLN
INTRAVENOUS | Status: AC
  Administered 2017-01-01: 25 mL
  Filled 2017-01-01: qty 50

## 2017-01-01 NOTE — Progress Notes (Signed)
INFECTIOUS DISEASE PROGRESS NOTE  ID: Jacob Pittman is a 71 y.o. male with  Principal Problem:   Perforated duodenal bulb ulcer ([place]) Active Problems:   Acute on chronic combined systolic and diastolic CHF (congestive heart failure) (HCC)   Nonischemic cardiomyopathy (HCC)   Pneumoperitoneum   Acute respiratory failure with hypoxia (HCC)   Paroxysmal A-fib (HCC)   CVA (cerebral vascular accident) ([place])   Arterial thrombosis (HCC)   H pylori ulcer   Fatty infiltration of liver   DM (diabetes mellitus), type 2 (HCC)   Pressure sore on heel, left, unstageable (HCC)   Poor venous access   Palliative care encounter   Goals of care, counseling/discussion   Bowel perforation ([place])   Sepsis ([place])   Coag negative Staphylococcus bacteremia  Subjective: Without complaints.   Abtx:  Anti-infectives    Start     Dose/Rate Route Frequency Ordered Stop   12/30/16 1230  fluconazole (DIFLUCAN) IVPB 200 mg     200 mg 100 mL/hr over 60 Minutes Intravenous Every 24 hours 12/30/16 1127     12/25/16 1600  DAPTOmycin (CUBICIN) 500 mg in sodium chloride 0.9 % IVPB     500 mg 220 mL/hr over 30 Minutes Intravenous Every 24 hours 12/25/16 1002     12/25/16 1300  DAPTOmycin (CUBICIN) 500 mg in sodium chloride 0.9 % IVPB  Status:  Discontinued     500 mg 220 mL/hr over 30 Minutes Intravenous Every 24 hours 12/25/16 0956 12/25/16 1002   12/23/16 1800  vancomycin (VANCOCIN) IVPB 750 mg/150 ml premix  Status:  Discontinued     750 mg 150 mL/hr over 60 Minutes Intravenous Every 12 hours 12/23/16 0310 12/25/16 0941   12/23/16 0145  vancomycin (VANCOCIN) 1,750 mg in sodium chloride 0.9 % 500 mL IVPB     1,750 mg 250 mL/hr over 120 Minutes Intravenous  Once 12/23/16 0138 12/23/16 0353   12/21/16 1500  piperacillin-tazobactam (ZOSYN) IVPB 3.375 g     3.375 g 12.5 mL/hr over 240 Minutes Intravenous Every 8 hours 12/21/16 1456     12/13/16 1000  amoxicillin (AMOXIL) 250 MG/5ML suspension 1,000 mg     1,000 mg Per Tube Every 12 hours 12/13/16 0935 12/16/16 1833   12/12/16 1900  piperacillin-tazobactam (ZOSYN) IVPB 3.375 g  Status:  Discontinued     3.375 g 12.5 mL/hr over 240 Minutes Intravenous Every 8 hours 12/12/16 1759 12/13/16 0911   12/12/16 1400  Ampicillin-Sulbactam (UNASYN) 3 g in sodium chloride 0.9 % 100 mL IVPB  Status:  Discontinued     3 g 200 mL/hr over 30 Minutes Intravenous Every 6 hours 12/12/16 1215 12/12/16 1753   12/10/16 0600  amoxicillin (AMOXIL) 250 MG/5ML suspension 1,000 mg  Status:  Discontinued     1,000 mg Per Tube Every 12 hours 12/09/16 1701 12/12/16 1206   12/09/16 1130  amoxicillin (AMOXIL) 250 MG/5ML suspension 1,000 mg  Status:  Discontinued     1,000 mg Oral Every 12 hours 12/09/16 1117 12/09/16 1118   12/09/16 1130  amoxicillin (AMOXIL) 250 MG/5ML suspension 1,000 mg  Status:  Discontinued     1,000 mg Per Tube Every 12 hours 12/09/16 1118 12/09/16 1701   12/04/16 2200  clarithromycin (BIAXIN) tablet 500 mg     500 mg Per Tube Every 12 hours 12/04/16 1107 12/16/16 2158   12/04/16 2000  piperacillin-tazobactam (ZOSYN) IVPB 3.375 g     3.375 g 12.5 mL/hr over 240 Minutes Intravenous Every 8 hours 12/04/16 1339 12/08/16  2359   12/04/16 1600  metroNIDAZOLE (FLAGYL) tablet 500 mg  Status:  Discontinued     500 mg Per Tube Every 8 hours 12/04/16 1107 12/09/16 1117   12/02/16 1100  clarithromycin (BIAXIN) 250 MG/5ML suspension 500 mg  Status:  Discontinued     500 mg Per Tube Every 12 hours 12/02/16 1012 12/04/16 1107   12/02/16 1045  metroNIDAZOLE (FLAGYL) 50 mg/ml oral suspension 500 mg  Status:  Discontinued     500 mg Per Tube 3 times daily 12/02/16 1036 12/04/16 1107   12/02/16 1015  metroNIDAZOLE (FLAGYL) 50 mg/ml oral suspension 250 mg  Status:  Discontinued     250 mg Per Tube 4 times daily 12/02/16 1011 12/02/16 1036   11/28/16 0800  fluconazole (DIFLUCAN) IVPB 200 mg  Status:  Discontinued     200 mg 100 mL/hr over 60 Minutes Intravenous Every 24  hours 11/27/16 0624 12/02/16 1018   11/27/16 0630  fluconazole (DIFLUCAN) IVPB 400 mg     400 mg 100 mL/hr over 120 Minutes Intravenous  Once 11/27/16 0622 11/27/16 0841   11/27/16 0600  piperacillin-tazobactam (ZOSYN) IVPB 3.375 g  Status:  Discontinued     3.375 g 12.5 mL/hr over 240 Minutes Intravenous Every 8 hours 11/27/16 0534 12/04/16 1339   11/27/16 0515  piperacillin-tazobactam (ZOSYN) IVPB 3.375 g  Status:  Discontinued     3.375 g 100 mL/hr over 30 Minutes Intravenous  Once 11/27/16 0510 11/27/16 0518   11/27/16 0030  piperacillin-tazobactam (ZOSYN) IVPB 3.375 g     3.375 g 100 mL/hr over 30 Minutes Intravenous  Once 11/27/16 0016 11/27/16 0755   11/27/16 0030  vancomycin (VANCOCIN) IVPB 1000 mg/200 mL premix     1,000 mg 200 mL/hr over 60 Minutes Intravenous  Once 11/27/16 0016 11/27/16 0756      Medications:  Scheduled: . apixaban  5 mg Oral Q12H  . aspirin  81 mg Per Tube Daily  . chlorhexidine gluconate (MEDLINE KIT)  15 mL Mouth Rinse BID  . lidocaine (PF)  5 mL Intradermal Once  . mouth rinse  15 mL Mouth Rinse QID  . midodrine  2.5 mg Oral TID WC  . pantoprazole  40 mg Oral BID  . sodium chloride flush  5 mL Intravenous Q8H    Objective: Vital signs in last 24 hours: Temp:  [97.6 F (36.4 C)-97.9 F (36.6 C)] 97.9 F (36.6 C) (09/14 0827) Pulse Rate:  [58-88] 61 (09/14 0827) Resp:  [16-33] 22 (09/14 0827) BP: (94-141)/(57-101) 104/70 (09/14 0827) SpO2:  [92 %-100 %] 97 % (09/14 0827)   General appearance: alert and no distress Resp: clear to auscultation bilaterally Chest wall: no tenderness, ICD no fluctuance.  Cardio: regular rate and rhythm GI: normal findings: bowel sounds normal and soft, non-tender Extremities: edema anasarca  Lab Results  Recent Labs  12/30/16 0352 12/30/16 0634 01/01/17 0831  WBC  --  5.7 4.2  HGB  --  9.3* 10.2*  HCT  --  30.2* 31.6*  NA 146*  --  137  K 4.4  --  3.6  CL 118*  --  108  CO2 19*  --  21*  BUN  24*  --  10  CREATININE 1.00  --  0.75   Liver Panel No results for input(s): PROT, ALBUMIN, AST, ALT, ALKPHOS, BILITOT, BILIDIR, IBILI in the last 72 hours. Sedimentation Rate No results for input(s): ESRSEDRATE in the last 72 hours. C-Reactive Protein No results for input(s): CRP in  the last 72 hours.  Microbiology: Recent Results (from the past 240 hour(s))  Body fluid culture     Status: Abnormal   Collection Time: 12/24/16 12:13 PM  Result Value Ref Range Status   Specimen Description FLUID  Final   Special Requests ASCITIES  Final   Gram Stain   Final    RARE WBC PRESENT,BOTH PMN AND MONONUCLEAR NO ORGANISMS SEEN    Culture (A)  Final    MULTIPLE ORGANISMS PRESENT, NONE PREDOMINANT CRITICAL RESULT CALLED TO, READ BACK BY AND VERIFIED WITH: A. DAVIS RN, AT 1429 12/25/16 BY D. VANHOOK REGARDING CULTURE GROWTH    Report Status 12/26/2016 FINAL  Final  Culture, blood (routine x 2)     Status: None   Collection Time: 12/25/16  2:21 AM  Result Value Ref Range Status   Specimen Description BLOOD LEFT HAND  Final   Special Requests IN PEDIATRIC BOTTLE Blood Culture adequate volume  Final   Culture NO GROWTH 5 DAYS  Final   Report Status 12/30/2016 FINAL  Final  Culture, blood (routine x 2)     Status: Abnormal   Collection Time: 12/25/16  2:35 AM  Result Value Ref Range Status   Specimen Description BLOOD LEFT WRIST  Final   Special Requests IN PEDIATRIC BOTTLE Blood Culture adequate volume  Final   Culture  Setup Time   Final    GRAM POSITIVE COCCI IN CLUSTERS IN PEDIATRIC BOTTLE CRITICAL RESULT CALLED TO, READ BACK BY AND VERIFIED WITH: V.BRYK PHARMD 12/30/16 0342 L.CHAMPION    Culture (A)  Final    STAPHYLOCOCCUS SPECIES (COAGULASE NEGATIVE) THE SIGNIFICANCE OF ISOLATING THIS ORGANISM FROM A SINGLE SET OF BLOOD CULTURES WHEN MULTIPLE SETS ARE DRAWN IS UNCERTAIN. PLEASE NOTIFY THE MICROBIOLOGY DEPARTMENT WITHIN ONE WEEK IF SPECIATION AND SENSITIVITIES ARE REQUIRED.     Report Status 12/31/2016 FINAL  Final  Blood Culture ID Panel (Reflexed)     Status: Abnormal   Collection Time: 12/25/16  2:35 AM  Result Value Ref Range Status   Enterococcus species NOT DETECTED NOT DETECTED Final   Vancomycin resistance NOT DETECTED NOT DETECTED Final   Listeria monocytogenes NOT DETECTED NOT DETECTED Final   Staphylococcus species DETECTED (A) NOT DETECTED Final    Comment: CRITICAL RESULT CALLED TO, READ BACK BY AND VERIFIED WITH: V.BRYK PHARMD 12/30/16 0342 L.CHAMPION    Staphylococcus aureus NOT DETECTED NOT DETECTED Final   Methicillin resistance DETECTED (A) NOT DETECTED Final    Comment: CRITICAL RESULT CALLED TO, READ BACK BY AND VERIFIED WITH: V.BRYK PHARMD 12/30/16 0342 L.CHAMPION    Streptococcus species NOT DETECTED NOT DETECTED Final   Streptococcus agalactiae NOT DETECTED NOT DETECTED Final   Streptococcus pneumoniae NOT DETECTED NOT DETECTED Final   Streptococcus pyogenes NOT DETECTED NOT DETECTED Final   Acinetobacter baumannii NOT DETECTED NOT DETECTED Final   Enterobacteriaceae species NOT DETECTED NOT DETECTED Final   Enterobacter cloacae complex NOT DETECTED NOT DETECTED Final   Escherichia coli NOT DETECTED NOT DETECTED Final   Klebsiella oxytoca NOT DETECTED NOT DETECTED Final   Klebsiella pneumoniae NOT DETECTED NOT DETECTED Final   Proteus species NOT DETECTED NOT DETECTED Final   Serratia marcescens NOT DETECTED NOT DETECTED Final   Carbapenem resistance NOT DETECTED NOT DETECTED Final   Haemophilus influenzae NOT DETECTED NOT DETECTED Final   Neisseria meningitidis NOT DETECTED NOT DETECTED Final   Pseudomonas aeruginosa NOT DETECTED NOT DETECTED Final   Candida albicans NOT DETECTED NOT DETECTED Final   Candida glabrata NOT  DETECTED NOT DETECTED Final   Candida krusei NOT DETECTED NOT DETECTED Final   Candida parapsilosis NOT DETECTED NOT DETECTED Final   Candida tropicalis NOT DETECTED NOT DETECTED Final    Studies/Results: No  results found.   Assessment/Plan: MRSE bacteremia MIC elevated (4) CK fine Central line out 9-6 Check TTE does not specifically mention endocarditis. Again, given his overall co-morbidities,  would not check TEE.  Repeat BCx sent 9-7 1/2 MRSE Day 9 of anti-MRSE anbx. Needs repeat bcx (sent) His repeat BCx suggest (but do not confirm) endocarditis, infection of his ICD.  Would suggest indefinite doxy '100mg'$  bid  Perforated gastric ulcer Intra-abdominal abscess Cx- poly-microbial. No change in anbx.  Stool draining from wound, defer to surgery.   ICD  Total days of antibiotics:  dapto 9-7 Zosyn 9-3         Bobby Rumpf Infectious Diseases (pager) [phone] [email or web address] 01/01/2017, 10:16 AM  LOS: 35 days

## 2017-01-01 NOTE — Progress Notes (Signed)
Orthopedic Tech Progress Note Patient Details:  Nazario Whitehair 02-22-1946 [id number]  Ortho Devices Type of Ortho Device: Abdominal binder Ortho Device/Splint Location: dropped off 46-62 Inch abdominal binder at bedside as request by floor nurse. ( it was stated that previous abdominal binder was too small and a larger one was needed). Ortho Device/Splint Interventions: Allyne Gee 01/01/2017, 10:01 PM

## 2017-01-01 NOTE — Progress Notes (Signed)
32 Days Post-Op   Subjective/Chief Complaint: No complaints ate yesterday Eakins pouch in place    Objective: Vital signs in last 24 hours: Temp:  [97.6 F (36.4 C)-97.9 F (36.6 C)] 97.8 F (36.6 C) (09/14 0426) Pulse Rate:  [58-88] 61 (09/14 0700) Resp:  [16-33] 30 (09/14 0700) BP: (94-141)/(57-101) 116/64 (09/14 0700) SpO2:  [92 %-100 %] 98 % (09/14 0700) Last BM Date: 01/01/17  Intake/Output from previous day: 09/13 0701 - 09/14 0700 In: 1912.5 [I.V.:1800; IV Piggyback:112.5] Out: 800 [Urine:800] Intake/Output this shift: No intake/output data recorded.  Incision/Wound:stool draining out inferior portion Eakins pouch in place   Lab Results:   Recent Labs  12/30/16 0634  WBC 5.7  HGB 9.3*  HCT 30.2*  PLT 304   BMET  Recent Labs  12/29/16 0908 12/30/16 0352  NA 148* 146*  K 3.7 4.4  CL 118* 118*  CO2 25 19*  GLUCOSE 78 60*  BUN 25* 24*  CREATININE 0.85 1.00  CALCIUM 7.8* 8.0*   PT/INR No results for input(s): LABPROT, INR in the last 72 hours. ABG No results for input(s): PHART, HCO3 in the last 72 hours.  Invalid input(s): PCO2, PO2  Studies/Results: No results found.  Anti-infectives: Anti-infectives    Start     Dose/Rate Route Frequency Ordered Stop   12/30/16 1230  fluconazole (DIFLUCAN) IVPB 200 mg     200 mg 100 mL/hr over 60 Minutes Intravenous Every 24 hours 12/30/16 1127     12/25/16 1600  DAPTOmycin (CUBICIN) 500 mg in sodium chloride 0.9 % IVPB     500 mg 220 mL/hr over 30 Minutes Intravenous Every 24 hours 12/25/16 1002     12/25/16 1300  DAPTOmycin (CUBICIN) 500 mg in sodium chloride 0.9 % IVPB  Status:  Discontinued     500 mg 220 mL/hr over 30 Minutes Intravenous Every 24 hours 12/25/16 0956 12/25/16 1002   12/23/16 1800  vancomycin (VANCOCIN) IVPB 750 mg/150 ml premix  Status:  Discontinued     750 mg 150 mL/hr over 60 Minutes Intravenous Every 12 hours 12/23/16 0310 12/25/16 0941   12/23/16 0145  vancomycin (VANCOCIN)  1,750 mg in sodium chloride 0.9 % 500 mL IVPB     1,750 mg 250 mL/hr over 120 Minutes Intravenous  Once 12/23/16 0138 12/23/16 0353   12/21/16 1500  piperacillin-tazobactam (ZOSYN) IVPB 3.375 g     3.375 g 12.5 mL/hr over 240 Minutes Intravenous Every 8 hours 12/21/16 1456     12/13/16 1000  amoxicillin (AMOXIL) 250 MG/5ML suspension 1,000 mg     1,000 mg Per Tube Every 12 hours 12/13/16 0935 12/16/16 1833   12/12/16 1900  piperacillin-tazobactam (ZOSYN) IVPB 3.375 g  Status:  Discontinued     3.375 g 12.5 mL/hr over 240 Minutes Intravenous Every 8 hours 12/12/16 1759 12/13/16 0911   12/12/16 1400  Ampicillin-Sulbactam (UNASYN) 3 g in sodium chloride 0.9 % 100 mL IVPB  Status:  Discontinued     3 g 200 mL/hr over 30 Minutes Intravenous Every 6 hours 12/12/16 1215 12/12/16 1753   12/10/16 0600  amoxicillin (AMOXIL) 250 MG/5ML suspension 1,000 mg  Status:  Discontinued     1,000 mg Per Tube Every 12 hours 12/09/16 1701 12/12/16 1206   12/09/16 1130  amoxicillin (AMOXIL) 250 MG/5ML suspension 1,000 mg  Status:  Discontinued     1,000 mg Oral Every 12 hours 12/09/16 1117 12/09/16 1118   12/09/16 1130  amoxicillin (AMOXIL) 250 MG/5ML suspension 1,000 mg  Status:  Discontinued     1,000 mg Per Tube Every 12 hours 12/09/16 1118 12/09/16 1701   12/04/16 2200  clarithromycin (BIAXIN) tablet 500 mg     500 mg Per Tube Every 12 hours 12/04/16 1107 12/16/16 2158   12/04/16 2000  piperacillin-tazobactam (ZOSYN) IVPB 3.375 g     3.375 g 12.5 mL/hr over 240 Minutes Intravenous Every 8 hours 12/04/16 1339 12/08/16 2359   12/04/16 1600  metroNIDAZOLE (FLAGYL) tablet 500 mg  Status:  Discontinued     500 mg Per Tube Every 8 hours 12/04/16 1107 12/09/16 1117   12/02/16 1100  clarithromycin (BIAXIN) 250 MG/5ML suspension 500 mg  Status:  Discontinued     500 mg Per Tube Every 12 hours 12/02/16 1012 12/04/16 1107   12/02/16 1045  metroNIDAZOLE (FLAGYL) 50 mg/ml oral suspension 500 mg  Status:  Discontinued      500 mg Per Tube 3 times daily 12/02/16 1036 12/04/16 1107   12/02/16 1015  metroNIDAZOLE (FLAGYL) 50 mg/ml oral suspension 250 mg  Status:  Discontinued     250 mg Per Tube 4 times daily 12/02/16 1011 12/02/16 1036   11/28/16 0800  fluconazole (DIFLUCAN) IVPB 200 mg  Status:  Discontinued     200 mg 100 mL/hr over 60 Minutes Intravenous Every 24 hours 11/27/16 0624 12/02/16 1018   11/27/16 0630  fluconazole (DIFLUCAN) IVPB 400 mg     400 mg 100 mL/hr over 120 Minutes Intravenous  Once 11/27/16 0622 11/27/16 0841   11/27/16 0600  piperacillin-tazobactam (ZOSYN) IVPB 3.375 g  Status:  Discontinued     3.375 g 12.5 mL/hr over 240 Minutes Intravenous Every 8 hours 11/27/16 0534 12/04/16 1339   11/27/16 0515  piperacillin-tazobactam (ZOSYN) IVPB 3.375 g  Status:  Discontinued     3.375 g 100 mL/hr over 30 Minutes Intravenous  Once 11/27/16 0510 11/27/16 0518   11/27/16 0030  piperacillin-tazobactam (ZOSYN) IVPB 3.375 g     3.375 g 100 mL/hr over 30 Minutes Intravenous  Once 11/27/16 0016 11/27/16 0755   11/27/16 0030  vancomycin (VANCOCIN) IVPB 1000 mg/200 mL premix     1,000 mg 200 mL/hr over 60 Minutes Intravenous  Once 11/27/16 0016 11/27/16 0756      Assessment/Plan: Patient Active Problem List   Diagnosis Date Noted  . Coag negative Staphylococcus bacteremia   . Bowel perforation (HCC)   . Sepsis (HCC)   . Palliative care encounter   . Goals of care, counseling/discussion   . Poor venous access   . Pressure sore on heel, left, unstageable (HCC) 12/16/2016  . Paroxysmal A-fib (HCC) 12/12/2016  . CVA (cerebral vascular accident) (HCC) 12/12/2016  . 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 H pylori ulcer 12/12/2016  . Fatty infiltration of liver 12/12/2016  . DM (diabetes mellitus), type 2 (HCC) 12/12/2016  . Acute respiratory failure with hypoxia (HCC)   . Pneumoperitoneum 11/27/2016  . Perforated duodenal bulb ulcer (HCC) 11/27/2016  . Elevated PSA 02/07/2016  . Contact  dermatitis 07/23/2015  . History of colonic polyps 07/15/2015  . Venous stasis ulcer of left lower extremity (HCC) 01/21/2015  . Left arm swelling 01/03/2015  . Dyspnea 01/03/2015  . Irregular heart beats 01/03/2015  . Wheezing 07/19/2014  . Peripheral arterial disease (HCC) 10/31/2013  . Conjunctivitis 09/22/2013  . Allergic rhinitis 09/22/2013  . ICD (implantable cardioverter-defibrillator), single, in situ 07/12/2013  . Chronic combined systolic and diastolic heart failure, NYHA class 3 (HCC) 04/10/2013  . Nonischemic cardiomyopathy (HCC)   .  Acute on chronic combined systolic and diastolic CHF (congestive heart failure) (HCC) 09/30/2012  . History of alcohol use 09/30/2012  . Back pain 10/08/2011  . 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 Preventative health care 08/17/2010  . HIP PAIN, RIGHT 11/06/2009  . ANXIETY 02/04/2009  . Diabetes (HCC) 11/07/2008  . Hyperlipidemia 11/07/2008  . 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 Essential hypertension 10/08/2008  . OSTEOARTHRITIS, KNEE, RIGHT 10/08/2008  . FATIGUE 10/08/2008   Perforated pyloric ulcer  S/P EXPLORATORY LAPAROTOMY, GRAHAM PATCH REPAIR8/10/18 Dr. 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 Darrick Penna CVA - on lovenox and aspirin, xarelto? Malnutrition - prealbumin 8.2 (9/7) Hypernatremia/hyperchloremia Dysphagia DM Acute renal failure - improved, Cr 0.82  COLOCUTANEOUS FISTULA focusing on comfort let him eat and use Eakins pouch    LOS: 35 days    Jacob Pittman A. 01/01/2017

## 2017-01-01 NOTE — NC FL2 (Signed)
[place] LEVEL OF CARE SCREENING TOOL     IDENTIFICATION  Patient Name: Jacob Pittman Birthdate: April 27, 1945 Sex: male Admission Date (Current Location): 11/26/2016  [facility] and [place] Number:  Herbalist and Address:  [facility] [place], [place] [address], [place], [place] [address]      Provider Number: [id number]  Attending Physician Name and Address:  Thurnell Lose, MD  Relative Name and Phone Number:       Current Level of Care: Hospital Recommended Level of Care: [place] Prior Approval Number:    Date Approved/Denied:   PASRR Number: [id number]  Discharge Plan: SNF    Current Diagnoses: Patient Active Problem List   Diagnosis Date Noted  . Coag negative Staphylococcus bacteremia   . Bowel perforation ([place])   . Sepsis ([place])   . Palliative care encounter   . Goals of care, counseling/discussion   . Poor venous access   . Pressure sore on heel, left, unstageable ([place]) 12/16/2016  . Paroxysmal A-fib ([place]) 12/12/2016  . CVA (cerebral vascular accident) ([place]) 12/12/2016  . 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 H pylori ulcer 12/12/2016  . Fatty infiltration of liver 12/12/2016  . DM (diabetes mellitus), type 2 ([place]) 12/12/2016  . Acute respiratory failure with hypoxia ([place])   . Pneumoperitoneum 11/27/2016  . Perforated duodenal bulb ulcer ([place]) 11/27/2016  . Elevated PSA 02/07/2016  . Contact dermatitis 07/23/2015  . History of colonic polyps 07/15/2015  . Venous stasis ulcer of left lower extremity ([place]) 01/21/2015  . Left arm swelling 01/03/2015  . Dyspnea 01/03/2015  . Irregular heart beats 01/03/2015  . Wheezing 07/19/2014  . Peripheral arterial disease ([place]) 10/31/2013  . Conjunctivitis 09/22/2013  . Allergic rhinitis 09/22/2013  . ICD (implantable cardioverter-defibrillator), single, in situ 07/12/2013  . Chronic combined systolic and diastolic heart failure, NYHA class 3 ([place]) 04/10/2013   . Nonischemic cardiomyopathy ([place])   . Acute on chronic combined systolic and diastolic CHF (congestive heart failure) ([place]) 09/30/2012  . History of alcohol use 09/30/2012  . Back pain 10/08/2011  . 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 Preventative health care 08/17/2010  . HIP PAIN, RIGHT 11/06/2009  . ANXIETY 02/04/2009  . Diabetes ([place]) 11/07/2008  . Hyperlipidemia 11/07/2008  . 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 Essential hypertension 10/08/2008  . OSTEOARTHRITIS, KNEE, RIGHT 10/08/2008  . FATIGUE 10/08/2008    Orientation RESPIRATION BLADDER Height & Weight     Self, Place, Situation  Normal External catheter Weight: 185 lb 13.6 oz (84.3 kg) Height:  _0  (185.4 cm)  BEHAVIORAL SYMPTOMS/MOOD NEUROLOGICAL BOWEL NUTRITION STATUS      Continent Diet (see DC summary)  AMBULATORY STATUS COMMUNICATION OF NEEDS Skin   Extensive Assist Verbally Surgical wounds (Wound type: surgical wound with EC fistula at the distal end;Drainage (amount, consistency, odor) green effluent with stool odor at distal end, fistula stomatized - eakin pouch)                       Personal Care Assistance Level of Assistance  Bathing, Dressing Bathing Assistance: Maximum assistance Feeding assistance: Limited assistance Dressing Assistance: Maximum assistance     Functional Limitations Info  Sight, Hearing, Speech Sight Info: Adequate Hearing Info: Adequate Speech Info: Adequate    SPECIAL CARE FACTORS FREQUENCY  PT (By licensed PT), OT (By licensed OT)     PT Frequency: 3/wk OT Frequency: 3/wk  Contractures Contractures Info: Not present    Additional Factors Info  Code Status, Allergies Code Status Info: DNR Allergies Info: NKA           Current Medications (01/01/2017):  This is the current hospital active medication list Current Facility-Administered Medications  Medication Dose Route Frequency Provider Last Rate Last Dose  . acetaminophen  (TYLENOL) solution 650 mg  650 mg Per Tube Q4H PRN Mannam, Praveen, MD   650 mg at 12/20/16 2323  . apixaban (ELIQUIS) tablet 5 mg  5 mg Oral Q12H Karren Cobble, RPH   5 mg at 01/01/17 0930  . artificial tears (LACRILUBE) ophthalmic ointment   Both Eyes Q3H PRN Hammonds, Sharyn Blitz, MD      . aspirin chewable tablet 81 mg  81 mg Per Tube Daily Anders Simmonds, MD   81 mg at 01/01/17 0930  . chlorhexidine gluconate (MEDLINE KIT) (PERIDEX) 0.12 % solution 15 mL  15 mL Mouth Rinse BID Mannam, Praveen, MD   15 mL at 12/31/16 2000  . collagenase (SANTYL) ointment   Topical Daily Lala Lund K, MD      . DAPTOmycin (CUBICIN) 500 mg in sodium chloride 0.9 % IVPB  500 mg Intravenous Q24H Susa Raring, [facility]   Stopped at 12/31/16 1713  . fentaNYL (SUBLIMAZE) injection 12.5-25 mcg  12.5-25 mcg Intravenous Q1H PRN Meuth, Brooke A, PA-C      . fluconazole (DIFLUCAN) IVPB 200 mg  200 mg Intravenous Q24H Lane Hacker L, DO   Stopped at 12/31/16 1618  . lidocaine (PF) (XYLOCAINE) 1 % injection 5 mL  5 mL Intradermal Once Johnson, Clanford L, MD      . MEDLINE mouth rinse  15 mL Mouth Rinse QID Mannam, Praveen, MD   15 mL at 01/01/17 0400  . midodrine (PROAMATINE) tablet 2.5 mg  2.5 mg Oral TID WC Mariel Aloe, MD   2.5 mg at 01/01/17 0930  . ondansetron (ZOFRAN-ODT) disintegrating tablet 4 mg  4 mg Oral Q6H PRN Alphonsa Overall, MD       Or  . ondansetron [facility]) injection 4 mg  4 mg Intravenous Q6H PRN Alphonsa Overall, MD      . pantoprazole (PROTONIX) EC tablet 40 mg  40 mg Oral BID Thurnell Lose, MD      . piperacillin-tazobactam (ZOSYN) IVPB 3.375 g  3.375 g Intravenous Q8H Georganna Skeans, MD 12.5 mL/hr at 01/01/17 0938 3.375 g at 01/01/17 0938  . sodium chloride flush (NS) 0.9 % injection 5 mL  5 mL Intravenous Q8H Jacqulynn Cadet, MD   5 mL at 01/01/17 4158     Discharge Medications: Please see discharge summary for a list of discharge medications.  Relevant Imaging  Results:  Relevant Lab Results:   Additional Information SSN: [id number]  Jorge Ny, [place]

## 2017-01-01 NOTE — Clinical Social Work Note (Signed)
Clinical Social Work Assessment  Patient Details  Name: Jacob Pittman MRN: [id number] Date of Birth: [birth year]  Date of referral:  01/01/17               Reason for consult:  Facility Placement                Permission sought to share information with:  Oceanographer granted to share information::  Yes, Verbal Permission Granted  Name::     Teacher, English as a foreign language::  SNF  Relationship::  dtr  Contact Information:     Housing/Transportation Living arrangements for the past 2 months:  Skilled Building surveyor of Information:  Patient, Adult Children Patient Interpreter Needed:  None Criminal Activity/Legal Involvement Pertinent to Current Situation/Hospitalization:  No - Comment as needed Significant Relationships:  None Lives with:  Self Do you feel safe going back to the place where you live?  No Need for family participation in patient care:  No (Coment)  Care giving concerns:  Pt lives at home alone and now with severe weakness and wound care needs.  Social Worker assessment / plan:  CSW spoke with pt concerning PT recommendation for SNF for continued therapy and wound care.   Patient expressed understanding of recommendation and gave verbal permission for CSW to speak with patient dtr- CSW updated dtr on conversation.  Employment status:  Retired Database administrator PT Recommendations:  Skilled Nursing Facility Information / Referral to community resources:  Skilled Nursing Facility  Patient/Family's Response to care:  Patient and pt dtr agreeable to SNF if appropriate but also still interested in hospice if patient declines.  Patient/Family's Understanding of and Emotional Response to Diagnosis, Current Treatment, and Prognosis:  Realistic understanding of pt condition and possible needs for DC.  Emotional Assessment Appearance:  Appears stated age Attitude/Demeanor/Rapport:  Lethargic Affect (typically observed):   Appropriate Orientation:  Oriented to Self, Oriented to Place, Oriented to  Time, Oriented to Situation Alcohol / Substance use:  Not Applicable Psych involvement (Current and /or in the community):  No (Comment)  Discharge Needs  Concerns to be addressed:  Care Coordination Readmission within the last 30 days:  No Current discharge risk:  Physical Impairment Barriers to Discharge:  Continued Medical Work up   Burna Sis, LCSW 01/01/2017, 3:47 PM

## 2017-01-01 NOTE — Progress Notes (Signed)
Pharmacy Antibiotic Note Jacob Pittman is a 71 y.o. male admitted on 11/26/2016 with abdominal pain, found to have perforated pyloric channel ulcer. Patient has also now developed MRSE bacteremia and is on day 8 daptomycin for treatment. Per ID would treat with 14 days of daptomycin; however pt still w/o documented negative blood cultures.   Plan: 1. Continue Daptomycin 500 mg IV every 24 hours, CK 49 on 9/14 2. Remains on Zosyn and fluconazole per primary team   Height: 6\' 1"  (185.4 cm) Weight: 185 lb 13.6 oz (84.3 kg) IBW/kg (Calculated) : 79.9  Temp (24hrs), Avg:97.8 F (36.6 C), Min:97.6 F (36.4 C), Max:97.9 F (36.6 C)   Recent Labs Lab 12/26/16 0932 12/28/16 0534 12/29/16 0908 12/30/16 0352 12/30/16 0634 01/01/17 0831  WBC 6.3 6.9  --   --  5.7 4.2  CREATININE 0.96 0.82 0.85 1.00  --  0.75    Estimated Creatinine Clearance: 92.9 mL/min (by C-G formula based on SCr of 0.75 mg/dL).    No Known Allergies  Antimicrobials this admission: 9/7 Daptomycin >> 8/10 Zosyn >> 8/22, 8/25>>8/26, 9/3 >>  8/10 Vancomycin x1, 9/5 >> 9/7 8/10 Diflucan >>8/15, 9/12 >>  8/15 Flagyl >>8/22 8/15 Biaxin >> 8/29 8/22 Amoxicillin>>8/25, 8/26 >> 8/29 8/25 Unasyn >>8/25  Microbiology results: 8/10 BCx: negative 8/10 Abd wound: normal skin flora 8/10 MRSA PCR: neg 8/9 BCx: neg 8/9 UCx: > 100K E coli R to Unaysn/amp, sens to all others 8/10 BCx: Neg 8/17 C. Diff: negative 8/18 Resp Cx: normal flora 8/17 UCx: neg 8/17 BCx: neg 8/23 BCx: neg 8/29 bcx: ngtd 9/3 bcx: MRSE in 2/4, Vanc MIC =4 9/6 Ascitic fluid: multiple organisms, none predominant 9/7 BCx >> 1/2 GPC, MRSE on BCID  Thank you for allowing pharmacy to be a part of this patient's care.  Pollyann Samples, PharmD, BCPS 01/01/2017, 10:17 AM

## 2017-01-01 NOTE — Progress Notes (Addendum)
PROGRESS NOTE    [name]  UVO:[id number] DOB: 01-07-1946 DOA: 11/26/2016 PCP: Biagio Borg, MD   Brief Narrative:  Jacob Pittman is a 71 y.o. 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 Was found to have a perforated pyloric channel ulcer and pneumoperitonitis s/p omental patch repair subsequently on epinephrine and Levophed.  8/12-  TTE LV moderately dilated with EF 15% &diffuse hypokinesis. There is akinesis of the inferolateral and inferior myocardium.  E coli UTI noted 8/13-  right brachial A- line placed and then developed ischemic right arm- vascular consult and transfer from [facility] to [facility]- thrombectomy of brachial artery- heparin Paroxysmal A-fib also noted 8/14- Decreased attenuation involving the right dentate nucleus in the right cerebellum and immediately adjacent cerebellar parenchyma, concerning for recent and potentially acute infarct in this area.  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 No focal weakness.   Assessment & Plan:    Perforated duodenal bulb ulcer, Peritonitis, H. Pylori ulcer, Septic shock with Abdominal Abscess and cutaneocolonic fistula.  Patient has had a prolonged hospital course secondary to above problems. Patient has received multiple antibiotic therapies. He had evidence of a abscess tracking along sigmoid colon mesentery/omentum with drainage  through the wound, evidence of wound necrosis. S/p drain placement with cultures significant for multiple organisms. Drain not outputting much, IR removed the drain on 12/30/2016, general surgery following closely along with ID.   Discussed the case with both general surgery and ID on 12/31/2016, fistula will be left open and will be covered with Eikin pouch by wound care, continue oral diet speech to clear. Patient wants to continue medical treatment but wants to be DO NOT RESUSCITATE, does not wish any further heroics, if medical treatment does not make him better he will consider hospice. He has had adequate antibiotic treatment for over 2-3 weeks for his abdominal issues. Zosyn and fluconazole and 01/01/2017 and monitor clinically.   Methicillin resistant staph species bacteremia  - Seen on 9/3 blood cultures x2. IJ removed 12/24/16, however repeat blood cultures 1 out of 2 again positive, discussed with ID physician Dr. Johnnye Sima in detail on 01/01/2017, at this time there is suspicion that patient's AICD might be infected however removal of AICD will undertake a major surgery, patient only wishes medical treatment does not want headache procedures, ID physician also had detailed discussion with patient's daughter. At this time plan is to switch him to oral doxycycline indefinitely for chronic suppressive treatment, no further cultures, if clinically declines and infection becomes worse comfort measures.  Tachypnea - due to atelectasis along with oral secretions and possible Pulmonary edema on imaging, resolved he is at baseline oxygen demand exam stable.  Acute on chronic systolic and diastolic heart failure - Repeat echo significant for improved EF of 25-30% from 15% with grade 2 diastolic dysfunction. Has been adequately diuresed, currently euvolemic. He uses when necessary low-dose Lasix gets  dehydrated fast.   H. pylori infection. Treated with triple antibiotic treatment.   Paroxysmal atrial  fibrillation  Mali vasc 2 score  of at least 5. continue metoprolol and for now on Eliquis  Bilateral basilar atelectasis  -continue to encourage incentive spirometry  Hypernatremia - urine concentrated, hold Lasix , resolved after hydration with D5W.   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   Arterial thrombosis  Right brachial artery. Secondary to A-line - provoked, S/p thrombectomy on 11-30-16, on Anticoagulation with Eliquis now.  Bilateral arm edema No DVT on ultrasound, diuresis as possible.   Fatty liver - supportive care  Pacer firing -  Read as asystole which does not appear to be the case. No arrhythmia noted. Seen by cardiology for device interrogation.  Cognitive impairment - mild delirium. Monitor.    Diabetes mellitus, type 2 A1c of 5.8%, - continue SSI  CBG (last 3)   Recent Labs  12/30/16 1807 12/30/16 1910 01/01/17 0756  GLUCAP 122* 124* 73      DVT prophylaxis: Eliquis Code Status: DNR Family Communication: None at bedside. Called daughter without answer Disposition Plan: Clearly rehabilitation or SNF, social work looking for placement   Consultants:   General surgery  Interventional radiology  Palliative care medicine  ID  Procedures:   8/10- ex lap- patch repair of pyloric ucler  8/13- brachial embolectomy  8/12- EF 15%, diffuse hypokinesis and akinesis of inferolateral/inferioir myocardium  9/6- Percutaneous drain  9/7- Echocardiogram: EF of 80-03%, grade 2 diastolic dysfunction. No evidence of vegetations  Anti-infectives    Start     Dose/Rate Route Frequency Ordered Stop   01/01/17 1200  doxycycline (VIBRA-TABS) tablet 100 mg     100 mg Oral Every 12 hours 01/01/17 1157     12/30/16 1230  fluconazole (DIFLUCAN) IVPB 200 mg  Status:  Discontinued     200 mg 100 mL/hr over 60 Minutes Intravenous Every 24 hours 12/30/16 1127 01/01/17 1203   12/25/16 1600  DAPTOmycin (CUBICIN) 500 mg in  sodium chloride 0.9 % IVPB  Status:  Discontinued     500 mg 220 mL/hr over 30 Minutes Intravenous Every 24 hours 12/25/16 1002 01/01/17 1157   12/25/16 1300  DAPTOmycin (CUBICIN) 500 mg in sodium chloride 0.9 % IVPB  Status:  Discontinued     500 mg 220 mL/hr over 30 Minutes Intravenous Every 24 hours 12/25/16 0956 12/25/16 1002   12/23/16 1800  vancomycin (VANCOCIN) IVPB 750 mg/150 ml premix  Status:  Discontinued     750 mg 150 mL/hr over 60 Minutes Intravenous Every 12 hours 12/23/16 0310 12/25/16 0941   12/23/16 0145  vancomycin (VANCOCIN) 1,750 mg in sodium chloride 0.9 % 500 mL IVPB     1,750 mg 250 mL/hr over 120 Minutes Intravenous  Once 12/23/16 0138 12/23/16 0353   12/21/16 1500  piperacillin-tazobactam (ZOSYN) IVPB 3.375 g  Status:  Discontinued     3.375 g 12.5 mL/hr over 240 Minutes Intravenous Every 8 hours 12/21/16 1456 01/01/17 1203   12/13/16 1000  amoxicillin (AMOXIL) 250 MG/5ML suspension 1,000 mg     1,000 mg Per Tube Every 12 hours 12/13/16 0935 12/16/16 1833   12/12/16 1900  piperacillin-tazobactam (ZOSYN) IVPB 3.375 g  Status:  Discontinued     3.375 g 12.5 mL/hr over 240 Minutes Intravenous Every 8 hours 12/12/16 1759 12/13/16 0911   12/12/16 1400  Ampicillin-Sulbactam (UNASYN) 3 g in sodium chloride 0.9 % 100 mL IVPB  Status:  Discontinued     3 g 200 mL/hr over 30 Minutes Intravenous Every 6 hours 12/12/16 1215 12/12/16 1753   12/10/16 0600  amoxicillin (AMOXIL) 250 MG/5ML suspension 1,000 mg  Status:  Discontinued     1,000 mg Per Tube Every 12 hours 12/09/16 1701 12/12/16 1206   12/09/16 1130  amoxicillin (AMOXIL) 250 MG/5ML suspension 1,000 mg  Status:  Discontinued     1,000 mg Oral Every 12 hours 12/09/16 1117 12/09/16 1118   12/09/16 1130  amoxicillin (AMOXIL) 250 MG/5ML suspension 1,000 mg  Status:  Discontinued     1,000 mg Per Tube Every 12 hours 12/09/16 1118 12/09/16 1701   12/04/16 2200  clarithromycin (BIAXIN) tablet 500 mg     500 mg Per Tube  Every 12 hours 12/04/16 1107 12/16/16 2158   12/04/16 2000  piperacillin-tazobactam (ZOSYN) IVPB 3.375 g     3.375 g 12.5 mL/hr over 240 Minutes Intravenous Every 8 hours 12/04/16 1339 12/08/16 2359   12/04/16 1600  metroNIDAZOLE (FLAGYL) tablet 500 mg  Status:  Discontinued     500 mg Per Tube Every 8 hours 12/04/16 1107 12/09/16 1117   12/02/16 1100  clarithromycin (BIAXIN) 250 MG/5ML suspension 500 mg  Status:  Discontinued     500 mg Per Tube Every 12 hours 12/02/16 1012 12/04/16 1107   12/02/16 1045  metroNIDAZOLE (FLAGYL) 50 mg/ml oral suspension 500 mg  Status:  Discontinued     500 mg Per Tube 3 times daily 12/02/16 1036 12/04/16 1107   12/02/16 1015  metroNIDAZOLE (FLAGYL) 50 mg/ml oral suspension 250 mg  Status:  Discontinued     250 mg Per Tube 4 times daily 12/02/16 1011 12/02/16 1036   11/28/16 0800  fluconazole (DIFLUCAN) IVPB 200 mg  Status:  Discontinued     200 mg 100 mL/hr over 60 Minutes Intravenous Every 24 hours 11/27/16 0624 12/02/16 1018   11/27/16 0630  fluconazole (DIFLUCAN) IVPB 400 mg     400 mg 100 mL/hr over 120 Minutes Intravenous  Once 11/27/16 0622 11/27/16 0841   11/27/16 0600  piperacillin-tazobactam (ZOSYN) IVPB 3.375 g  Status:  Discontinued     3.375 g 12.5 mL/hr over 240 Minutes Intravenous Every 8 hours 11/27/16 0534 12/04/16 1339   11/27/16 0515  piperacillin-tazobactam (ZOSYN) IVPB 3.375 g  Status:  Discontinued     3.375 g 100 mL/hr over 30 Minutes Intravenous  Once 11/27/16 0510 11/27/16 0518   11/27/16 0030  piperacillin-tazobactam (ZOSYN) IVPB 3.375 g     3.375 g 100 mL/hr over 30 Minutes Intravenous  Once 11/27/16 0016 11/27/16 0755   11/27/16 0030  vancomycin (VANCOCIN) IVPB 1000 mg/200 mL premix     1,000 mg 200 mL/hr over 60 Minutes Intravenous  Once 11/27/16 0016 11/27/16 0756        Objective:  Vitals:   01/01/17 0700 01/01/17 0752 01/01/17 0825 01/01/17 0827  BP: 116/64   104/70  Pulse: 61   61  Resp: (!) 30   (!) 22  Temp:    97.9 F (36.6 C) 97.9 F (36.6 C)  TempSrc:  Oral Oral Oral  SpO2: 98%   97%  Weight:      Height:        Intake/Output Summary (Last 24 hours) at 01/01/17 1204 Last data filed at 01/01/17 1046  Gross per 24 hour  Intake           1452.5 ml  Output  450 ml  Net           1002.5 ml   Filed Weights   12/28/16 0407 12/29/16 0300 12/30/16 0400  Weight: 83.2 kg (183 lb 6.8 oz) 82.5 kg (181 lb 14.1 oz) 84.3 kg (185 lb 13.6 oz)    Examination:  Awake Alert, Oriented X 3, No new F.N deficits, Normal affect [place].AT,PERRAL Supple Neck,No JVD, No cervical lymphadenopathy appriciated.  Symmetrical Chest wall movement, Good air movement bilaterally, CTAB RRR,No Gallops,Rubs or new Murmurs, No Parasternal Heave +ve B.Sounds, Abd Soft, No tenderness, No organomegaly appriciated, No rebound - guarding or rigidity. Abd bandage/binder in place, stool through fistula No Cyanosis, Clubbing or edema, No new Rash or bruise      Data Reviewed: I have personally reviewed following labs and imaging studies  CBC:  Recent Labs Lab 12/26/16 0932 12/28/16 0534 12/30/16 0634 01/01/17 0831  WBC 6.3 6.9 5.7 4.2  HGB 8.2* 9.0* 9.3* 10.2*  HCT 27.4* 30.2* 30.2* 31.6*  MCV 108.3* 107.1* 108.2* 105.3*  PLT 284 343 304 151   Basic Metabolic Panel:  Recent Labs Lab 12/26/16 0932 12/28/16 0534 12/29/16 0908 12/30/16 0352 01/01/17 0831  NA 148* 147* 148* 146* 137  K 3.6 3.1* 3.7 4.4 3.6  CL 117* 116* 118* 118* 108  CO2 20* 23 25 19* 21*  GLUCOSE 121* 110* 78 60* 76  BUN 30* 22* 25* 24* 10  CREATININE 0.96 0.82 0.85 1.00 0.75  CALCIUM 7.5* 7.7* 7.8* 8.0* 7.7*   GFR: Estimated Creatinine Clearance: 92.9 mL/min (by C-G formula based on SCr of 0.75 mg/dL). Liver Function Tests:  Recent Labs Lab 12/28/16 0534  AST 39  ALT 49  ALKPHOS 92  BILITOT 0.6  PROT 5.7*  ALBUMIN 2.0*   No results for input(s): LIPASE, AMYLASE in the last 168 hours. No results for input(s):  AMMONIA in the last 168 hours. Coagulation Profile: No results for input(s): INR, PROTIME in the last 168 hours. Cardiac Enzymes:  Recent Labs Lab 01/01/17 0831  CKTOTAL 49   BNP (last 3 results) No results for input(s): PROBNP in the last 8760 hours. HbA1C: No results for input(s): HGBA1C in the last 72 hours. CBG:  Recent Labs Lab 12/30/16 1139 12/30/16 1534 12/30/16 1807 12/30/16 1910 01/01/17 0756  GLUCAP 63* 60* 122* 124* 73   Lipid Profile: No results for input(s): CHOL, HDL, LDLCALC, TRIG, CHOLHDL, LDLDIRECT in the last 72 hours. Thyroid Function Tests: No results for input(s): TSH, T4TOTAL, FREET4, T3FREE, THYROIDAB in the last 72 hours. Anemia Panel: No results for input(s): VITAMINB12, FOLATE, FERRITIN, TIBC, IRON, RETICCTPCT in the last 72 hours. Sepsis Labs: No results for input(s): PROCALCITON, LATICACIDVEN in the last 168 hours.  Recent Results (from the past 240 hour(s))  Body fluid culture     Status: Abnormal   Collection Time: 12/24/16 12:13 PM  Result Value Ref Range Status   Specimen Description FLUID  Final   Special Requests ASCITIES  Final   Gram Stain   Final    RARE WBC PRESENT,BOTH PMN AND MONONUCLEAR NO ORGANISMS SEEN    Culture (A)  Final    MULTIPLE ORGANISMS PRESENT, NONE PREDOMINANT CRITICAL RESULT CALLED TO, READ BACK BY AND VERIFIED WITH: A. DAVIS RN, AT [id number] 12/25/16 BY D. VANHOOK REGARDING CULTURE GROWTH    Report Status 12/26/2016 FINAL  Final  Culture, blood (routine x 2)     Status: None   Collection Time: 12/25/16  2:21 AM  Result Value Ref Range Status  Specimen Description BLOOD LEFT HAND  Final   Special Requests IN PEDIATRIC BOTTLE Blood Culture adequate volume  Final   Culture NO GROWTH 5 DAYS  Final   Report Status 12/30/2016 FINAL  Final  Culture, blood (routine x 2)     Status: Abnormal   Collection Time: 12/25/16  2:35 AM  Result Value Ref Range Status   Specimen Description BLOOD LEFT WRIST  Final   Special  Requests IN PEDIATRIC BOTTLE Blood Culture adequate volume  Final   Culture  Setup Time   Final    GRAM POSITIVE COCCI IN CLUSTERS IN PEDIATRIC BOTTLE CRITICAL RESULT CALLED TO, READ BACK BY AND VERIFIED WITH: V.BRYK PHARMD 12/30/16 0342 L.CHAMPION    Culture (A)  Final    STAPHYLOCOCCUS SPECIES (COAGULASE NEGATIVE) THE SIGNIFICANCE OF ISOLATING THIS ORGANISM FROM A SINGLE SET OF BLOOD CULTURES WHEN MULTIPLE SETS ARE DRAWN IS UNCERTAIN. PLEASE NOTIFY THE MICROBIOLOGY DEPARTMENT WITHIN ONE WEEK IF SPECIATION AND SENSITIVITIES ARE REQUIRED.    Report Status 12/31/2016 FINAL  Final  Blood Culture ID Panel (Reflexed)     Status: Abnormal   Collection Time: 12/25/16  2:35 AM  Result Value Ref Range Status   Enterococcus species NOT DETECTED NOT DETECTED Final   Vancomycin resistance NOT DETECTED NOT DETECTED Final   Listeria monocytogenes NOT DETECTED NOT DETECTED Final   Staphylococcus species DETECTED (A) NOT DETECTED Final    Comment: CRITICAL RESULT CALLED TO, READ BACK BY AND VERIFIED WITH: V.BRYK PHARMD 12/30/16 0342 L.CHAMPION    Staphylococcus aureus NOT DETECTED NOT DETECTED Final   Methicillin resistance DETECTED (A) NOT DETECTED Final    Comment: CRITICAL RESULT CALLED TO, READ BACK BY AND VERIFIED WITH: V.BRYK PHARMD 12/30/16 0342 L.CHAMPION    Streptococcus species NOT DETECTED NOT DETECTED Final   Streptococcus agalactiae NOT DETECTED NOT DETECTED Final   Streptococcus pneumoniae NOT DETECTED NOT DETECTED Final   Streptococcus pyogenes NOT DETECTED NOT DETECTED Final   Acinetobacter baumannii NOT DETECTED NOT DETECTED Final   Enterobacteriaceae species NOT DETECTED NOT DETECTED Final   Enterobacter cloacae complex NOT DETECTED NOT DETECTED Final   Escherichia coli NOT DETECTED NOT DETECTED Final   Klebsiella oxytoca NOT DETECTED NOT DETECTED Final   Klebsiella pneumoniae NOT DETECTED NOT DETECTED Final   Proteus species NOT DETECTED NOT DETECTED Final   Serratia marcescens  NOT DETECTED NOT DETECTED Final   Carbapenem resistance NOT DETECTED NOT DETECTED Final   Haemophilus influenzae NOT DETECTED NOT DETECTED Final   Neisseria meningitidis NOT DETECTED NOT DETECTED Final   Pseudomonas aeruginosa NOT DETECTED NOT DETECTED Final   Candida albicans NOT DETECTED NOT DETECTED Final   Candida glabrata NOT DETECTED NOT DETECTED Final   Candida krusei NOT DETECTED NOT DETECTED Final   Candida parapsilosis NOT DETECTED NOT DETECTED Final   Candida tropicalis NOT DETECTED NOT DETECTED Final     Radiology Studies: No results found.   Scheduled Meds: . apixaban  5 mg Oral Q12H  . aspirin  81 mg Per Tube Daily  . chlorhexidine gluconate (MEDLINE KIT)  15 mL Mouth Rinse BID  . collagenase   Topical Daily  . doxycycline  100 mg Oral Q12H  . lidocaine (PF)  5 mL Intradermal Once  . mouth rinse  15 mL Mouth Rinse QID  . midodrine  2.5 mg Oral TID WC  . pantoprazole  40 mg Oral BID  . sodium chloride flush  5 mL Intravenous Q8H   Continuous Infusions:    LOS: 35  days   Signature  Lala Lund M.D on 01/01/2017 at 12:04 PM  Between 7am to 7pm - Pager - [phone] ( page via [email or web address], text pages only, please mention full 10 digit call back number).  After 7pm go to [email or web address] - password [facility]

## 2017-01-01 NOTE — Consult Note (Signed)
WOC Nurse wound follow up Wound type:sacral wound, unstageable, pressure wound Measurement: 6cm x 8cm x 0.1cm  Wound bed:70% pink, 10% black eschar spots 20% yellow fibrin Drainage (amount, consistency, odor) none noted Periwound: intact Dressing procedure/placement/frequency: Asked by bedside RN to see patient to assess known sacral pressure wound. Looks like shallow healing wound now with two areas of unstageable, one covered with black eschar, one with yellow fibrin. I have provided nurses with orders for Santyl dressing changes. We will continue to follow patient for fistula care, will not follow for sacral wound.    Barnett Hatter, RN-C, WTA-C Wound Treatment Associate

## 2017-01-01 NOTE — Evaluation (Signed)
Clinical/Bedside Swallow Evaluation Patient Details  Name: Jacob Pittman MRN: [id number] Date of Birth: 1945-07-30  Today's Date: 01/01/2017 Time: SLP Start Time (ACUTE ONLY): 0844 SLP Stop Time (ACUTE ONLY): 0855 SLP Time Calculation (min) (ACUTE ONLY): 11 min  Past Medical History:  Past Medical History:  Diagnosis Date  . ANXIETY 02/04/2009  . CHF (congestive heart failure) (HCC)   . Chronic systolic CHF (congestive heart failure) (HCC)   . DIABETES MELLITUS, TYPE II 11/07/2008  . 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 HIP PAIN, RIGHT 11/06/2009  . HYPERLIPIDEMIA 11/07/2008  . HYPERTENSION 10/08/2008  . Nonischemic cardiomyopathy [facility])    s/p ICD implantation 03-2013 by Dr Ladona Ridgel  . OSTEOARTHRITIS, KNEE, RIGHT 10/08/2008  . Peripheral arterial disease (HCC)    Past Surgical History:  Past Surgical History:  Procedure Laterality Date  . CARDIAC DEFIBRILLATOR PLACEMENT Left 04/10/13   BTK single chamber ICD implanted by Dr Ladona Ridgel for primary prevention  . EMBOLECTOMY Right 11/30/2016   Procedure: BRACHIAL EMBOLECTOMY WITH VEIN PATCH ANGIOPLASTY;  Surgeon: Sherren Kerns, MD;  Location: [facility] OR;  Service: Vascular;  Laterality: Right;  . IMPLANTABLE CARDIOVERTER DEFIBRILLATOR IMPLANT N/A 04/10/2013   Procedure: IMPLANTABLE CARDIOVERTER DEFIBRILLATOR IMPLANT;  Surgeon: Marinus Maw, MD;  Location: [facility] CATH LAB;  Service: Cardiovascular;  Laterality: N/A;  . LAPAROTOMY N/A 11/27/2016   Procedure: EXPLORATORY LAPAROTOMY abdominal exploration oversew pyloric channel ulcer gram patch repair perforated ulcer;  Surgeon: Ovidio Kin, MD;  Location: [facility] ORS;  Service: General;  Laterality: N/A;  . s/p left broken arm with pin     1980's   HPI:  Pt is a 71 year old man with ischemic cardiomyopathy, DM, HTN, PAD, OSA who was admitted 8/9 with gastric ulcer perforation and peritonitis. He underwent emergent laparotomy with omental patch repair, postoperatively remained in shock requiring  epinephrine and Levophed drip. Course c/b acute ischemia of R arm requiring tx from [facility] to [facility] and urgent OR embolectomy, also c/b slow vent wean. He was intubated 8/10-8/23. MBS completed 8/26 showed a severe oropharyngeal dysphagia with 100% of the bolus remaining in the vallecula resulting in delayed aspiration. Last SLP visit 8/31 due to medical issues preventing therapy. Per palliative care meeting 9/12, comfort is his primary goal. He wanted to d/c cortrak, begin PO diet, and avoid TPN now and in the future. Pt was started on a soft diet and thin liquids; SLP ordered to assess oropharyngeal function.   Assessment / Plan / Recommendation Clinical Impression  Pt's last MBS on 8/26 showed a severe oropharyngeal dysphagia with recommendations to remain NPO. Since that time, pt/family wishes have been to allow for a PO diet, with no desire for alternative means of nutrition (Cortrak d/c, no TPN), so a soft diet and thin liquids were initiated.   SLP provided skilled observation during breakfast meal with throat clearing and intermittent coughing noted. Hyolaryngeal movement seems minimal to palpation. Pt has had increased time post-extubation to allow for less irritation of the pharynx/larynx that would suggest perhaps some improvement in swallow function, but on the other hand he has still not been eating and drinking and therefore is likely still very deconditioned, likely having similar swallowing difficulty.   SLP had a discussion with pt about his wishes in regards to PO intake, offering options for MBS, alternative diets. Pt is adamant that he wants to be able to eat and drink by mouth, reiterating no NGT. He also wants to be able to eat and drink what he wants, saying  he would not want a modified diet/liquids because eating and drinking "makes him feel good."   Although there is concern for aspiration, it seems like completing an MBS would not change the course of pt's tx and therefore would not be  considered necessary testing. Pt would like to hold on testing at this time and verbalized the risks of aspiration. I offered to talk to his family about this as well but he declined, stating that he felt good about his decision.   Recommend to continue current diet as comfort feeding per pt wishes. Would focus on careful feeding, additional dry swallows, and use of aspiration precautions to mitigate risk as much as possible. Frequent, thorough oral care will also be important. Per pt request, will f/u briefly for additional education.  SLP Visit Diagnosis: Dysphagia, oropharyngeal phase (R13.12)    Aspiration Risk  Severe aspiration risk    Diet Recommendation Dysphagia 3 (Mech soft);Thin liquid (per pt/family wishes - for comfort)   Liquid Administration via: Cup Medication Administration: Crushed with puree Supervision: Staff to assist with self feeding;Full supervision/cueing for compensatory strategies Compensations: Slow rate;Small sips/bites;Multiple dry swallows after each bite/sip Postural Changes: Seated upright at 90 degrees;Remain upright for at least 30 minutes after po intake    Other  Recommendations Oral Care Recommendations: Oral care QID   Follow up Recommendations Skilled Nursing facility      Frequency and Duration min 2x/week  1 week       Prognosis Prognosis for Safe Diet Advancement: Fair Barriers to Reach Goals: Severity of deficits      Swallow Study   General Date of Onset: 11/26/16 HPI: Pt is a 71 year old man with ischemic cardiomyopathy, DM, HTN, PAD, OSA who was admitted 8/9 with gastric ulcer perforation and peritonitis. He underwent emergent laparotomy with omental patch repair, postoperatively remained in shock requiring epinephrine and Levophed drip. Course c/b acute ischemia of R arm requiring tx from [facility] to [facility] and urgent OR embolectomy, also c/b slow vent wean. He was intubated 8/10-8/23. MBS completed 8/26 showed a severe oropharyngeal dysphagia  with 100% of the bolus remaining in the vallecula resulting in delayed aspiration. Last SLP visit 8/31 due to medical issues preventing therapy. Per palliative care meeting 9/12, comfort is his primary goal. He wanted to d/c cortrak, begin PO diet, and avoid TPN now and in the future. Pt was started on a soft diet and thin liquids; SLP ordered to assess oropharyngeal function. Type of Study: Bedside Swallow Evaluation Previous Swallow Assessment: see HPI Diet Prior to this Study: Dysphagia 3 (soft);Thin liquids Temperature Spikes Noted: No Respiratory Status: Room air History of Recent Intubation: Yes Length of Intubations (days): 13 days Date extubated: 12/10/16 Behavior/Cognition: Alert;Cooperative;Pleasant mood Oral Care Completed by SLP: No (already eating breakfast) Oral Cavity - Dentition: Poor condition;Missing dentition Vision: Functional for self-feeding Self-Feeding Abilities: Able to feed self;Needs assist Patient Positioning: Upright in bed Baseline Vocal Quality: Hoarse Volitional Cough: Strong Volitional Swallow: Able to elicit    Oral/Motor/Sensory Function     Ice Chips Ice chips: Not tested   Thin Liquid Thin Liquid: Impaired Presentation: Cup;Self Fed;Straw Pharyngeal  Phase Impairments: Throat Clearing - Immediate;Cough - Delayed    Nectar Thick Nectar Thick Liquid: Not tested   Honey Thick Honey Thick Liquid: Not tested   Puree Puree: Not tested   Solid   GO   Solid: Impaired Presentation: Self Fed;Spoon Oral Phase Functional Implications: Impaired mastication Pharyngeal Phase Impairments: Throat Clearing - Immediate;Cough - Delayed  [name] 01/01/2017,10:33 AM  [name], M.A. CCC-SLP [phone]

## 2017-01-02 LAB — CBC WITH DIFFERENTIAL/PLATELET
BASOS PCT: 0 %
Basophils Absolute: 0 10*3/uL (ref 0.0–0.1)
EOS ABS: 0 10*3/uL (ref 0.0–0.7)
Eosinophils Relative: 1 %
HEMATOCRIT: 30.4 % — AB (ref 39.0–52.0)
HEMOGLOBIN: 9.4 g/dL — AB (ref 13.0–17.0)
LYMPHS ABS: 1.2 10*3/uL (ref 0.7–4.0)
Lymphocytes Relative: 30 %
MCH: 32.5 pg (ref 26.0–34.0)
MCHC: 30.9 g/dL (ref 30.0–36.0)
MCV: 105.2 fL — ABNORMAL HIGH (ref 78.0–100.0)
MONOS PCT: 11 %
Monocytes Absolute: 0.4 10*3/uL (ref 0.1–1.0)
NEUTROS ABS: 2.3 10*3/uL (ref 1.7–7.7)
NEUTROS PCT: 58 %
Platelets: 287 10*3/uL (ref 150–400)
RBC: 2.89 MIL/uL — AB (ref 4.22–5.81)
RDW: 16.4 % — ABNORMAL HIGH (ref 11.5–15.5)
WBC: 3.9 10*3/uL — AB (ref 4.0–10.5)

## 2017-01-02 LAB — URINALYSIS, ROUTINE W REFLEX MICROSCOPIC
Bilirubin Urine: NEGATIVE
Glucose, UA: NEGATIVE mg/dL
KETONES UR: NEGATIVE mg/dL
Leukocytes, UA: NEGATIVE
NITRITE: NEGATIVE
PH: 5 (ref 5.0–8.0)
Protein, ur: 30 mg/dL — AB
SPECIFIC GRAVITY, URINE: 1.023 (ref 1.005–1.030)
Squamous Epithelial / LPF: NONE SEEN

## 2017-01-02 MED ORDER — ACETAMINOPHEN 160 MG/5ML PO SOLN
650.0000 mg | ORAL | Status: DC | PRN
Start: 2017-01-02 — End: 2017-01-13
  Administered 2017-01-02 – 2017-01-10 (×4): 650 mg via ORAL
  Filled 2017-01-02 (×5): qty 20.3

## 2017-01-02 NOTE — Progress Notes (Addendum)
Patient's urine appears to be darker in color (tea/red). Small clots were noted. Blount, NP notified. Urinalysis ordered. Eliquis on hold.

## 2017-01-02 NOTE — Progress Notes (Signed)
PROGRESS NOTE    Jacob Pittman  UVO:[id number] DOB: 01-07-1946 DOA: 11/26/2016 PCP: Biagio Borg, MD   Brief Narrative:  Jacob Pittman is a 72 y.o. 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 Was found to have a perforated pyloric channel ulcer and pneumoperitonitis s/p omental patch repair subsequently on epinephrine and Levophed.  8/12-  TTE LV moderately dilated with EF 15% &diffuse hypokinesis. There is akinesis of the inferolateral and inferior myocardium.  E coli UTI noted 8/13-  right brachial A- line placed and then developed ischemic right arm- vascular consult and transfer from [facility] to [facility]- thrombectomy of brachial artery- heparin Paroxysmal A-fib also noted 8/14- Decreased attenuation involving the right dentate nucleus in the right cerebellum and immediately adjacent cerebellar parenchyma, concerning for recent and potentially acute infarct in this area.  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 No focal weakness.   Assessment & Plan:    Perforated duodenal bulb ulcer, Peritonitis, H. Pylori ulcer, Septic shock with Abdominal Abscess and cutaneocolonic fistula.  Patient has had a prolonged hospital course secondary to above problems. Patient has received multiple antibiotic therapies. He had evidence of a abscess tracking along sigmoid colon mesentery/omentum with drainage  through the wound, evidence of wound necrosis. S/p drain placement with cultures significant for multiple organisms. Drain not outputting much, IR removed the drain on 12/30/2016, general surgery following closely along with ID.   Discussed the case with both general surgery and ID on 12/31/2016, fistula will be left open and will be covered with Eikin pouch by wound care, continue oral diet speech to clear. Patient wants to continue medical treatment but wants to be DO NOT RESUSCITATE, does not wish any further heroics, if medical treatment does not make him better he will consider hospice. He has had adequate antibiotic treatment for over 2-3 weeks for his abdominal issues. Zosyn and fluconazole and 01/01/2017 and monitor clinically.   Methicillin resistant staph species bacteremia  - Seen on 9/3 blood cultures x2. IJ removed 12/24/16, however repeat blood cultures 1 out of 2 again positive, discussed with ID physician Dr. Johnnye Sima in detail on 01/01/2017, at this time there is suspicion that patient's AICD might be infected however removal of AICD will undertake a major surgery, patient only wishes medical treatment does not want headache procedures, ID physician also had detailed discussion with patient's daughter. At this time plan is to switch him to oral doxycycline indefinitely for chronic suppressive treatment, no further cultures, if clinically declines and infection becomes worse comfort measures.  Tachypnea - due to atelectasis along with oral secretions and possible Pulmonary edema on imaging, resolved he is at baseline oxygen demand exam stable.  Acute on chronic systolic and diastolic heart failure - Repeat echo significant for improved EF of 25-30% from 15% with grade 2 diastolic dysfunction. Has been adequately diuresed, currently euvolemic. He uses when necessary low-dose Lasix gets  dehydrated fast.   H. pylori infection. Treated with triple antibiotic treatment.   Paroxysmal atrial  fibrillation  Mali vasc 2 score  of at least 5. continue metoprolol and for now on Eliquis  Bilateral basilar atelectasis  -continue to encourage incentive spirometry  Hypernatremia - urine concentrated, hold Lasix , resolved after hydration with D5W.   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   Arterial thrombosis  Right brachial artery. Secondary to A-line - provoked, S/p thrombectomy on 11-30-16, on Anticoagulation with Eliquis now.  Bilateral arm edema No DVT on ultrasound, diuresis as possible.   Fatty liver - supportive care  Pacer firing -  Read as asystole which does not appear to be the case. No arrhythmia noted. Seen by cardiology for device interrogation.  Cognitive impairment - mild delirium. Monitor.    Diabetes mellitus, type 2 A1c of 5.8%, - continue SSI  CBG (last 3)   Recent Labs  01/01/17 0756 01/01/17 1635 01/01/17 1730  GLUCAP 73 64* 97      DVT prophylaxis: Eliquis Code Status: DNR Family Communication:  daughter at bedside on 9/15 Disposition Plan: difficult placement due to large abdominal wound, case discussed with social worker over the phone on 9/15  Plan to d/c to SNF with doxycycline indefinitely, with palliative care following. family will consider comfort measures if patient deteriorate.   Consultants:   General surgery  Interventional radiology  Palliative care medicine  ID  Procedures:   8/10- ex lap- patch repair of pyloric ucler  8/13- brachial embolectomy  8/12- EF 15%, diffuse hypokinesis and akinesis of inferolateral/inferioir myocardium  9/6- Percutaneous drain  9/7- Echocardiogram: EF of 04-88%, grade 2 diastolic dysfunction. No evidence of vegetations  Anti-infectives    Start     Dose/Rate Route Frequency Ordered Stop   01/01/17 1200  doxycycline (VIBRA-TABS) tablet 100 mg     100 mg Oral Every 12 hours 01/01/17 1157     12/30/16 1230  fluconazole (DIFLUCAN) IVPB 200 mg   Status:  Discontinued     200 mg 100 mL/hr over 60 Minutes Intravenous Every 24 hours 12/30/16 1127 01/01/17 1203   12/25/16 1600  DAPTOmycin (CUBICIN) 500 mg in sodium chloride 0.9 % IVPB  Status:  Discontinued     500 mg 220 mL/hr over 30 Minutes Intravenous Every 24 hours 12/25/16 1002 01/01/17 1157   12/25/16 1300  DAPTOmycin (CUBICIN) 500 mg in sodium chloride 0.9 % IVPB  Status:  Discontinued     500 mg 220 mL/hr over 30 Minutes Intravenous Every 24 hours 12/25/16 0956 12/25/16 1002   12/23/16 1800  vancomycin (VANCOCIN) IVPB 750 mg/150 ml premix  Status:  Discontinued     750 mg 150 mL/hr over 60 Minutes Intravenous Every 12 hours 12/23/16 0310 12/25/16 0941   12/23/16 0145  vancomycin (VANCOCIN) 1,750 mg in sodium chloride 0.9 % 500 mL IVPB     1,750 mg 250 mL/hr over 120 Minutes Intravenous  Once 12/23/16 0138 12/23/16 0353   12/21/16 1500  piperacillin-tazobactam (ZOSYN) IVPB 3.375 g  Status:  Discontinued     3.375 g 12.5 mL/hr over 240 Minutes Intravenous Every 8 hours 12/21/16 1456 01/01/17 1203   12/13/16 1000  amoxicillin (AMOXIL) 250 MG/5ML suspension 1,000 mg     1,000 mg Per Tube Every 12 hours 12/13/16 0935 12/16/16 1833   12/12/16 1900  piperacillin-tazobactam (ZOSYN) IVPB 3.375 g  Status:  Discontinued     3.375 g 12.5 mL/hr over 240 Minutes  Intravenous Every 8 hours 12/12/16 1759 12/13/16 0911   12/12/16 1400  Ampicillin-Sulbactam (UNASYN) 3 g in sodium chloride 0.9 % 100 mL IVPB  Status:  Discontinued     3 g 200 mL/hr over 30 Minutes Intravenous Every 6 hours 12/12/16 1215 12/12/16 1753   12/10/16 0600  amoxicillin (AMOXIL) 250 MG/5ML suspension 1,000 mg  Status:  Discontinued     1,000 mg Per Tube Every 12 hours 12/09/16 1701 12/12/16 1206   12/09/16 1130  amoxicillin (AMOXIL) 250 MG/5ML suspension 1,000 mg  Status:  Discontinued     1,000 mg Oral Every 12 hours 12/09/16 1117 12/09/16 1118   12/09/16 1130  amoxicillin (AMOXIL) 250 MG/5ML suspension 1,000 mg   Status:  Discontinued     1,000 mg Per Tube Every 12 hours 12/09/16 1118 12/09/16 1701   12/04/16 2200  clarithromycin (BIAXIN) tablet 500 mg     500 mg Per Tube Every 12 hours 12/04/16 1107 12/16/16 2158   12/04/16 2000  piperacillin-tazobactam (ZOSYN) IVPB 3.375 g     3.375 g 12.5 mL/hr over 240 Minutes Intravenous Every 8 hours 12/04/16 1339 12/08/16 2359   12/04/16 1600  metroNIDAZOLE (FLAGYL) tablet 500 mg  Status:  Discontinued     500 mg Per Tube Every 8 hours 12/04/16 1107 12/09/16 1117   12/02/16 1100  clarithromycin (BIAXIN) 250 MG/5ML suspension 500 mg  Status:  Discontinued     500 mg Per Tube Every 12 hours 12/02/16 1012 12/04/16 1107   12/02/16 1045  metroNIDAZOLE (FLAGYL) 50 mg/ml oral suspension 500 mg  Status:  Discontinued     500 mg Per Tube 3 times daily 12/02/16 1036 12/04/16 1107   12/02/16 1015  metroNIDAZOLE (FLAGYL) 50 mg/ml oral suspension 250 mg  Status:  Discontinued     250 mg Per Tube 4 times daily 12/02/16 1011 12/02/16 1036   11/28/16 0800  fluconazole (DIFLUCAN) IVPB 200 mg  Status:  Discontinued     200 mg 100 mL/hr over 60 Minutes Intravenous Every 24 hours 11/27/16 0624 12/02/16 1018   11/27/16 0630  fluconazole (DIFLUCAN) IVPB 400 mg     400 mg 100 mL/hr over 120 Minutes Intravenous  Once 11/27/16 0622 11/27/16 0841   11/27/16 0600  piperacillin-tazobactam (ZOSYN) IVPB 3.375 g  Status:  Discontinued     3.375 g 12.5 mL/hr over 240 Minutes Intravenous Every 8 hours 11/27/16 0534 12/04/16 1339   11/27/16 0515  piperacillin-tazobactam (ZOSYN) IVPB 3.375 g  Status:  Discontinued     3.375 g 100 mL/hr over 30 Minutes Intravenous  Once 11/27/16 0510 11/27/16 0518   11/27/16 0030  piperacillin-tazobactam (ZOSYN) IVPB 3.375 g     3.375 g 100 mL/hr over 30 Minutes Intravenous  Once 11/27/16 0016 11/27/16 0755   11/27/16 0030  vancomycin (VANCOCIN) IVPB 1000 mg/200 mL premix     1,000 mg 200 mL/hr over 60 Minutes Intravenous  Once 11/27/16 0016 11/27/16 0756         Objective:  Vitals:   01/01/17 2303 01/02/17 0349 01/02/17 0724 01/02/17 1126  BP: 109/62 112/65 108/71 98/67  Pulse: 85 67 (!) 37 65  Resp: (!) 26 (!) 24    Temp: 98.4 F (36.9 C) 98.2 F (36.8 C) (!) 97.4 F (36.3 C) 97.7 F (36.5 C)  TempSrc: Oral Oral Oral Oral  SpO2: 98% 100% 100% 100%  Weight:  84.6 kg (186 lb 8.2 oz)    Height:        Intake/Output Summary (Last 24  hours) at 01/02/17 1557 Last data filed at 01/02/17 0300  Gross per 24 hour  Intake                0 ml  Output              800 ml  Net             -800 ml   Filed Weights   12/29/16 0300 12/30/16 0400 01/02/17 0349  Weight: 82.5 kg (181 lb 14.1 oz) 84.3 kg (185 lb 13.6 oz) 84.6 kg (186 lb 8.2 oz)    Examination:  Awake Alert, not oriented to time but to person and place,  No new F.N deficits, Normal affect [place].AT,PERRAL Supple Neck,No JVD, No cervical lymphadenopathy appriciated.  Symmetrical Chest wall movement, Good air movement bilaterally, CTAB RRR,No Gallops,Rubs or new Murmurs, No Parasternal Heave +ve B.Sounds, Abd Soft, No tenderness, No organomegaly appriciated, No rebound - guarding or rigidity. Abd bandage/binder in place, stool through fistula No Cyanosis, Clubbing or edema, No new Rash or bruise      Data Reviewed: I have personally reviewed following labs and imaging studies  CBC:  Recent Labs Lab 12/28/16 0534 12/30/16 0634 01/01/17 0831 01/02/17 0358  WBC 6.9 5.7 4.2 3.9*  NEUTROABS  --   --   --  2.3  HGB 9.0* 9.3* 10.2* 9.4*  HCT 30.2* 30.2* 31.6* 30.4*  MCV 107.1* 108.2* 105.3* 105.2*  PLT 343 304 242 967   Basic Metabolic Panel:  Recent Labs Lab 12/28/16 0534 12/29/16 0908 12/30/16 0352 01/01/17 0831  NA 147* 148* 146* 137  K 3.1* 3.7 4.4 3.6  CL 116* 118* 118* 108  CO2 23 25 19* 21*  GLUCOSE 110* 78 60* 76  BUN 22* 25* 24* 10  CREATININE 0.82 0.85 1.00 0.75  CALCIUM 7.7* 7.8* 8.0* 7.7*   GFR: Estimated Creatinine Clearance: 92.9 mL/min  (by C-G formula based on SCr of 0.75 mg/dL). Liver Function Tests:  Recent Labs Lab 12/28/16 0534  AST 39  ALT 49  ALKPHOS 92  BILITOT 0.6  PROT 5.7*  ALBUMIN 2.0*   No results for input(s): LIPASE, AMYLASE in the last 168 hours. No results for input(s): AMMONIA in the last 168 hours. Coagulation Profile: No results for input(s): INR, PROTIME in the last 168 hours. Cardiac Enzymes:  Recent Labs Lab 01/01/17 0831  CKTOTAL 49   BNP (last 3 results) No results for input(s): PROBNP in the last 8760 hours. HbA1C: No results for input(s): HGBA1C in the last 72 hours. CBG:  Recent Labs Lab 12/30/16 1807 12/30/16 1910 01/01/17 0756 01/01/17 1635 01/01/17 1730  GLUCAP 122* 124* 73 64* 97   Lipid Profile: No results for input(s): CHOL, HDL, LDLCALC, TRIG, CHOLHDL, LDLDIRECT in the last 72 hours. Thyroid Function Tests: No results for input(s): TSH, T4TOTAL, FREET4, T3FREE, THYROIDAB in the last 72 hours. Anemia Panel: No results for input(s): VITAMINB12, FOLATE, FERRITIN, TIBC, IRON, RETICCTPCT in the last 72 hours. Sepsis Labs: No results for input(s): PROCALCITON, LATICACIDVEN in the last 168 hours.  Recent Results (from the past 240 hour(s))  Body fluid culture     Status: Abnormal   Collection Time: 12/24/16 12:13 PM  Result Value Ref Range Status   Specimen Description FLUID  Final   Special Requests ASCITIES  Final   Gram Stain   Final    RARE WBC PRESENT,BOTH PMN AND MONONUCLEAR NO ORGANISMS SEEN    Culture (A)  Final    MULTIPLE ORGANISMS  PRESENT, NONE PREDOMINANT CRITICAL RESULT CALLED TO, READ BACK BY AND VERIFIED WITH: A. DAVIS RN, AT 6761 12/25/16 BY D. VANHOOK REGARDING CULTURE GROWTH    Report Status 12/26/2016 FINAL  Final  Culture, blood (routine x 2)     Status: None   Collection Time: 12/25/16  2:21 AM  Result Value Ref Range Status   Specimen Description BLOOD LEFT HAND  Final   Special Requests IN PEDIATRIC BOTTLE Blood Culture adequate  volume  Final   Culture NO GROWTH 5 DAYS  Final   Report Status 12/30/2016 FINAL  Final  Culture, blood (routine x 2)     Status: Abnormal   Collection Time: 12/25/16  2:35 AM  Result Value Ref Range Status   Specimen Description BLOOD LEFT WRIST  Final   Special Requests IN PEDIATRIC BOTTLE Blood Culture adequate volume  Final   Culture  Setup Time   Final    GRAM POSITIVE COCCI IN CLUSTERS IN PEDIATRIC BOTTLE CRITICAL RESULT CALLED TO, READ BACK BY AND VERIFIED WITH: V.BRYK PHARMD 12/30/16 0342 L.CHAMPION    Culture (A)  Final    STAPHYLOCOCCUS SPECIES (COAGULASE NEGATIVE) THE SIGNIFICANCE OF ISOLATING THIS ORGANISM FROM A SINGLE SET OF BLOOD CULTURES WHEN MULTIPLE SETS ARE DRAWN IS UNCERTAIN. PLEASE NOTIFY THE MICROBIOLOGY DEPARTMENT WITHIN ONE WEEK IF SPECIATION AND SENSITIVITIES ARE REQUIRED.    Report Status 12/31/2016 FINAL  Final  Blood Culture ID Panel (Reflexed)     Status: Abnormal   Collection Time: 12/25/16  2:35 AM  Result Value Ref Range Status   Enterococcus species NOT DETECTED NOT DETECTED Final   Vancomycin resistance NOT DETECTED NOT DETECTED Final   Listeria monocytogenes NOT DETECTED NOT DETECTED Final   Staphylococcus species DETECTED (A) NOT DETECTED Final    Comment: CRITICAL RESULT CALLED TO, READ BACK BY AND VERIFIED WITH: V.BRYK PHARMD 12/30/16 0342 L.CHAMPION    Staphylococcus aureus NOT DETECTED NOT DETECTED Final   Methicillin resistance DETECTED (A) NOT DETECTED Final    Comment: CRITICAL RESULT CALLED TO, READ BACK BY AND VERIFIED WITH: V.BRYK PHARMD 12/30/16 0342 L.CHAMPION    Streptococcus species NOT DETECTED NOT DETECTED Final   Streptococcus agalactiae NOT DETECTED NOT DETECTED Final   Streptococcus pneumoniae NOT DETECTED NOT DETECTED Final   Streptococcus pyogenes NOT DETECTED NOT DETECTED Final   Acinetobacter baumannii NOT DETECTED NOT DETECTED Final   Enterobacteriaceae species NOT DETECTED NOT DETECTED Final   Enterobacter cloacae  complex NOT DETECTED NOT DETECTED Final   Escherichia coli NOT DETECTED NOT DETECTED Final   Klebsiella oxytoca NOT DETECTED NOT DETECTED Final   Klebsiella pneumoniae NOT DETECTED NOT DETECTED Final   Proteus species NOT DETECTED NOT DETECTED Final   Serratia marcescens NOT DETECTED NOT DETECTED Final   Carbapenem resistance NOT DETECTED NOT DETECTED Final   Haemophilus influenzae NOT DETECTED NOT DETECTED Final   Neisseria meningitidis NOT DETECTED NOT DETECTED Final   Pseudomonas aeruginosa NOT DETECTED NOT DETECTED Final   Candida albicans NOT DETECTED NOT DETECTED Final   Candida glabrata NOT DETECTED NOT DETECTED Final   Candida krusei NOT DETECTED NOT DETECTED Final   Candida parapsilosis NOT DETECTED NOT DETECTED Final   Candida tropicalis NOT DETECTED NOT DETECTED Final  Culture, blood (Routine X 2) w Reflex to ID Panel     Status: None (Preliminary result)   Collection Time: 01/01/17 12:45 PM  Result Value Ref Range Status   Specimen Description BLOOD LEFT ARM  Final   Special Requests IN PEDIATRIC BOTTLE Blood  Culture adequate volume  Final   Culture NO GROWTH < 24 HOURS  Final   Report Status PENDING  Incomplete     Radiology Studies: No results found.   Scheduled Meds: . apixaban  5 mg Oral Q12H  . aspirin  81 mg Per Tube Daily  . chlorhexidine gluconate (MEDLINE KIT)  15 mL Mouth Rinse BID  . collagenase   Topical Daily  . doxycycline  100 mg Oral Q12H  . lidocaine (PF)  5 mL Intradermal Once  . mouth rinse  15 mL Mouth Rinse QID  . midodrine  2.5 mg Oral TID WC  . pantoprazole  40 mg Oral BID  . sodium chloride flush  5 mL Intravenous Q8H   Continuous Infusions:    LOS: 36 days   Signature  Kaliel Bolds M.D PhD on 01/02/2017 at 3:57 PM  Between 7am to 7pm - Pager - [phone] ( page via [email or web address], text pages only, please mention full 10 digit call back number).  After 7pm go to [email or web address] - password [email or web address]

## 2017-01-02 NOTE — Progress Notes (Signed)
Patient ID: Jacob Pittman, male   DOB: January 06, 1946, 71 y.o.   MRN: [id number]  [facility] Surgery Progress Note  33 Days Post-Op  Subjective: CC- thirsty No complaints this morning. Slept well last night. Denies abdominal pain, nausea, vomiting. Tolerating dysphagia 3 diet.   Objective: Vital signs in last 24 hours: Temp:  [97.4 F (36.3 C)-98.5 F (36.9 C)] 97.4 F (36.3 C) (09/15 0724) Pulse Rate:  [37-85] 37 (09/15 0724) Resp:  [17-26] 24 (09/15 0349) BP: (96-112)/(49-71) 108/71 (09/15 0724) SpO2:  [95 %-100 %] 100 % (09/15 0724) Weight:  [186 lb 8.2 oz (84.6 kg)] 186 lb 8.2 oz (84.6 kg) (09/15 0349) Last BM Date:  (midline leaking stool-Eakins pouch)  Intake/Output from previous day: 09/14 0701 - 09/15 0700 In: 240 [P.O.:240] Out: 800 [Urine:700; Stool:100] Intake/Output this shift: No intake/output data recorded.  PE: Gen:  Alert, NAD, cooperative HEENT: EOM's intact, pupils equal and round Pulm:  CTAB, no W/R/R, effort normal Abd: Soft, mild distension, +BS, open midline incision with Eakins pouch in place, no abdominal tenderness Ext: edema noted to BUE  Lab Results:   Recent Labs  01/01/17 0831 01/02/17 0358  WBC 4.2 3.9*  HGB 10.2* 9.4*  HCT 31.6* 30.4*  PLT 242 287   BMET  Recent Labs  01/01/17 0831  NA 137  K 3.6  CL 108  CO2 21*  GLUCOSE 76  BUN 10  CREATININE 0.75  CALCIUM 7.7*   PT/INR No results for input(s): LABPROT, INR in the last 72 hours. CMP     Component Value Date/Time   NA 137 01/01/2017 0831   K 3.6 01/01/2017 0831   CL 108 01/01/2017 0831   CO2 21 (L) 01/01/2017 0831   GLUCOSE 76 01/01/2017 0831   BUN 10 01/01/2017 0831   CREATININE 0.75 01/01/2017 0831   CREATININE 0.91 03/04/2015 1618   CALCIUM 7.7 (L) 01/01/2017 0831   PROT 5.7 (L) 12/28/2016 0534   ALBUMIN 2.0 (L) 12/28/2016 0534   AST 39 12/28/2016 0534   ALT 49 12/28/2016 0534   ALKPHOS 92 12/28/2016 0534   BILITOT 0.6 12/28/2016 0534   GFRNONAA >60  01/01/2017 0831   GFRAA >60 01/01/2017 0831   Lipase     Component Value Date/Time   LIPASE 20 12/01/2016 0304       Studies/Results: No results found.  Anti-infectives: Anti-infectives    Start     Dose/Rate Route Frequency Ordered Stop   01/01/17 1200  doxycycline (VIBRA-TABS) tablet 100 mg     100 mg Oral Every 12 hours 01/01/17 1157     12/30/16 1230  fluconazole (DIFLUCAN) IVPB 200 mg  Status:  Discontinued     200 mg 100 mL/hr over 60 Minutes Intravenous Every 24 hours 12/30/16 1127 01/01/17 1203   12/25/16 1600  DAPTOmycin (CUBICIN) 500 mg in sodium chloride 0.9 % IVPB  Status:  Discontinued     500 mg 220 mL/hr over 30 Minutes Intravenous Every 24 hours 12/25/16 1002 01/01/17 1157   12/25/16 1300  DAPTOmycin (CUBICIN) 500 mg in sodium chloride 0.9 % IVPB  Status:  Discontinued     500 mg 220 mL/hr over 30 Minutes Intravenous Every 24 hours 12/25/16 0956 12/25/16 1002   12/23/16 1800  vancomycin (VANCOCIN) IVPB 750 mg/150 ml premix  Status:  Discontinued     750 mg 150 mL/hr over 60 Minutes Intravenous Every 12 hours 12/23/16 0310 12/25/16 0941   12/23/16 0145  vancomycin (VANCOCIN) 1,750 mg in sodium chloride 0.9 %  500 mL IVPB     1,750 mg 250 mL/hr over 120 Minutes Intravenous  Once 12/23/16 0138 12/23/16 0353   12/21/16 1500  piperacillin-tazobactam (ZOSYN) IVPB 3.375 g  Status:  Discontinued     3.375 g 12.5 mL/hr over 240 Minutes Intravenous Every 8 hours 12/21/16 1456 01/01/17 1203   12/13/16 1000  amoxicillin (AMOXIL) 250 MG/5ML suspension 1,000 mg     1,000 mg Per Tube Every 12 hours 12/13/16 0935 12/16/16 1833   12/12/16 1900  piperacillin-tazobactam (ZOSYN) IVPB 3.375 g  Status:  Discontinued     3.375 g 12.5 mL/hr over 240 Minutes Intravenous Every 8 hours 12/12/16 1759 12/13/16 0911   12/12/16 1400  Ampicillin-Sulbactam (UNASYN) 3 g in sodium chloride 0.9 % 100 mL IVPB  Status:  Discontinued     3 g 200 mL/hr over 30 Minutes Intravenous Every 6 hours  12/12/16 1215 12/12/16 1753   12/10/16 0600  amoxicillin (AMOXIL) 250 MG/5ML suspension 1,000 mg  Status:  Discontinued     1,000 mg Per Tube Every 12 hours 12/09/16 1701 12/12/16 1206   12/09/16 1130  amoxicillin (AMOXIL) 250 MG/5ML suspension 1,000 mg  Status:  Discontinued     1,000 mg Oral Every 12 hours 12/09/16 1117 12/09/16 1118   12/09/16 1130  amoxicillin (AMOXIL) 250 MG/5ML suspension 1,000 mg  Status:  Discontinued     1,000 mg Per Tube Every 12 hours 12/09/16 1118 12/09/16 1701   12/04/16 2200  clarithromycin (BIAXIN) tablet 500 mg     500 mg Per Tube Every 12 hours 12/04/16 1107 12/16/16 2158   12/04/16 2000  piperacillin-tazobactam (ZOSYN) IVPB 3.375 g     3.375 g 12.5 mL/hr over 240 Minutes Intravenous Every 8 hours 12/04/16 1339 12/08/16 2359   12/04/16 1600  metroNIDAZOLE (FLAGYL) tablet 500 mg  Status:  Discontinued     500 mg Per Tube Every 8 hours 12/04/16 1107 12/09/16 1117   12/02/16 1100  clarithromycin (BIAXIN) 250 MG/5ML suspension 500 mg  Status:  Discontinued     500 mg Per Tube Every 12 hours 12/02/16 1012 12/04/16 1107   12/02/16 1045  metroNIDAZOLE (FLAGYL) 50 mg/ml oral suspension 500 mg  Status:  Discontinued     500 mg Per Tube 3 times daily 12/02/16 1036 12/04/16 1107   12/02/16 1015  metroNIDAZOLE (FLAGYL) 50 mg/ml oral suspension 250 mg  Status:  Discontinued     250 mg Per Tube 4 times daily 12/02/16 1011 12/02/16 1036   11/28/16 0800  fluconazole (DIFLUCAN) IVPB 200 mg  Status:  Discontinued     200 mg 100 mL/hr over 60 Minutes Intravenous Every 24 hours 11/27/16 0624 12/02/16 1018   11/27/16 0630  fluconazole (DIFLUCAN) IVPB 400 mg     400 mg 100 mL/hr over 120 Minutes Intravenous  Once 11/27/16 0622 11/27/16 0841   11/27/16 0600  piperacillin-tazobactam (ZOSYN) IVPB 3.375 g  Status:  Discontinued     3.375 g 12.5 mL/hr over 240 Minutes Intravenous Every 8 hours 11/27/16 0534 12/04/16 1339   11/27/16 0515  piperacillin-tazobactam (ZOSYN) IVPB 3.375  g  Status:  Discontinued     3.375 g 100 mL/hr over 30 Minutes Intravenous  Once 11/27/16 0510 11/27/16 0518   11/27/16 0030  piperacillin-tazobactam (ZOSYN) IVPB 3.375 g     3.375 g 100 mL/hr over 30 Minutes Intravenous  Once 11/27/16 0016 11/27/16 0755   11/27/16 0030  vancomycin (VANCOCIN) IVPB 1000 mg/200 mL premix     1,000 mg 200 mL/hr  over 60 Minutes Intravenous  Once 11/27/16 0016 11/27/16 0756       Assessment/Plan Perforated pyloric ulcer  S/P EXPLORATORY LAPAROTOMY, GRAHAM PATCH REPAIR8/10/18 Dr. 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 Darrick Penna CVA - on Eliquis Malnutrition - prealbumin 8.2 (9/7) Hypernatremia/hyperchloremia - resolved Dysphagia DM Acute renal failure - improved Code status DNR  FEN: dyphagia 3 diet ID:  zosyn 8/9- 12/08/16, Diflucan 8/9-8/15/18, clarithromycin 8/17 - 8/29,Amoxicillin 8/23-29/18 =>>day 15 of antibiotics  Vancomycin restarted 9/5>>9/7, Zosyn restarted 9/3>>9/14,Daptomycin  9/7>>9/14, Diflucan restarted 9/12>>9/14,  Doxycycline 9/14>>day#2 (indefinite for possible endocarditis/infxn of his ICD) DVT: Eliquis  Plan: Continue Eakins pouch and diet.   LOS: 36 days    Franne Forts , [facility] Surgery 01/02/2017, 8:19 AM Pager: [phone] Consults: [phone] Mon-Fri 7:00 am-4:30 pm Sat-Sun 7:00 am-11:30 am

## 2017-01-03 LAB — BASIC METABOLIC PANEL
Anion gap: 7 (ref 5–15)
BUN: 9 mg/dL (ref 6–20)
CALCIUM: 8 mg/dL — AB (ref 8.9–10.3)
CHLORIDE: 111 mmol/L (ref 101–111)
CO2: 22 mmol/L (ref 22–32)
Creatinine, Ser: 0.63 mg/dL (ref 0.61–1.24)
GFR calc Af Amer: >60 mL/min (ref >60)
GFR calc non Af Amer: >60 mL/min (ref >60)
GLUCOSE: 79 mg/dL (ref 65–99)
Potassium: 3.1 mmol/L — ABNORMAL LOW (ref 3.5–5.1)
Sodium: 140 mmol/L (ref 135–145)

## 2017-01-03 LAB — GLUCOSE, CAPILLARY
GLUCOSE-CAPILLARY: 70 mg/dL (ref 65–99)
GLUCOSE-CAPILLARY: 76 mg/dL (ref 65–99)
GLUCOSE-CAPILLARY: 80 mg/dL (ref 65–99)
Glucose-Capillary: 82 mg/dL (ref 65–99)
Glucose-Capillary: 68 mg/dL (ref 65–99)

## 2017-01-03 LAB — MAGNESIUM: Magnesium: 1.8 mg/dL (ref 1.7–2.4)

## 2017-01-03 MED ORDER — POTASSIUM CHLORIDE CRYS ER 20 MEQ PO TBCR
20.0000 meq | EXTENDED_RELEASE_TABLET | Freq: Two times a day (BID) | ORAL | Status: AC
  Administered 2017-01-03 (×2): 20 meq via ORAL
  Filled 2017-01-03 (×2): qty 1

## 2017-01-03 NOTE — Progress Notes (Signed)
01/03/2017 Per Dr Roda Shutters continue the Eliquis and stop the Asprin 81 mg. [facility] Rn

## 2017-01-03 NOTE — Progress Notes (Signed)
0100  Report of patient having blood present in urine from previous night shift. Patient has no documented urine during the day shift. Bladder scan performed with 250 cc residual.  NP Blount called at this time for request of I & O. Performed I & O cath, with 500cc pink tinged urine output.  UA reveals RBC's to numerous to count, NP Blount called and notified. Will hold night time Eliquis dose at this time per NP Blount. Report given to day nurse to pass on to day team for further orders regarding day dosage of Eliquis.

## 2017-01-03 NOTE — Progress Notes (Signed)
01/03/2017 Patient had a bladder scan done at 0928 and he had 350 cc in bladder. Dr Roda Shutters was made aware and order was given to place foley cath for urinary retention. Foley was placed at 1200 patient had 550cc of amber, cloudy urine. [facility] RN.

## 2017-01-03 NOTE — Progress Notes (Addendum)
PROGRESS NOTE    Jacob Pittman  BOE:[id number] DOB: [birth year] DOA: 11/26/2016 PCP: [name], MD   Brief Narrative:  Jacob Pittman is a 71 y.o. 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 Was found to have a perforated pyloric channel ulcer and pneumoperitonitis s/p omental patch repair subsequently on epinephrine and Levophed.  8/12-  TTE LV moderately dilated with EF 15% &diffuse hypokinesis. There is akinesis of the inferolateral and inferior myocardium.  E coli UTI noted 8/13-  right brachial A- line placed and then developed ischemic right arm- vascular consult and transfer from [facility] to [facility]- thrombectomy of brachial artery- heparin Paroxysmal A-fib also noted 8/14- Decreased attenuation involving the right dentate nucleus in the right cerebellum and immediately adjacent cerebellar parenchyma, concerning for recent and potentially acute infarct in this area.  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- anticoagulation recommended to be stopped for 10-14 days - H pyloli + started on triple therapy - Transaminitis thought to be du to Diflucan hepatotoxicity 8/17 - febrile again and vasopressors resumed 8/23- extubated 8/25 A-fib with HR up to 160s, fever 101.6 at 4 AM- care transferred to [facility] 9/3 - stool noted to be coming out of wound   Subjective:   Post ovid residual about 500cc last night, he required in and out cath Bladder scan this am 350cc,  Patient in bed, appears comfortable, denies any headache, no fever, no chest pain or pressure, no shortness of breath , no abdominal pain. No focal weakness.   Assessment & Plan:   Acute urinary retention on 9/15-9/16, ua on 9/15 with rbc TNTC, hgb stable Insert foley, hold asa, continue elliquis  Hypokalemia: replacek, check  mag  Perforated duodenal bulb ulcer, Peritonitis, H. Pylori ulcer, Septic shock with  Abdominal Abscess and cutaneocolonic fistula.  Patient has had a prolonged hospital course secondary to above problems. Patient has received multiple antibiotic therapies. He had evidence of a abscess tracking along sigmoid colon mesentery/omentum with drainage through the wound, evidence of wound necrosis. S/p drain placement with cultures significant for multiple organisms. Drain not outputting much, IR removed the drain on 12/30/2016, general surgery following closely along with ID.   Discussed the case with both general surgery and ID on 12/31/2016, fistula will be left open and will be covered with Eikin pouch by wound care, continue oral diet speech to clear. Patient wants to continue medical treatment but wants to be DO NOT RESUSCITATE, does not wish any further heroics, if medical treatment does not make him better he will consider hospice. He has had adequate antibiotic treatment for over 2-3 weeks for his abdominal issues. Zosyn and fluconazole , last dose on 01/01/2017 and monitor clinically.   Methicillin resistant staph species bacteremia  - Seen on 9/3 blood cultures x2. IJ removed 12/24/16, however repeat blood cultures 1 out of 2 again positive, discussed with ID physician Dr. Ninetta Lights in detail on 01/01/2017, at this time there is suspicion that patient's AICD might be infected however removal of AICD will undertake a major surgery, patient only wishes medical treatment does not want headache procedures, ID physician also had detailed discussion with patient's daughter. At this time plan is to switch him to oral doxycycline indefinitely for chronic suppressive treatment, no further cultures, if clinically declines and infection becomes worse comfort measures.  Tachypnea - due to atelectasis along with oral secretions and possible Pulmonary edema on  imaging, resolved he is at baseline oxygen demand exam stable.  Acute on chronic systolic and diastolic heart failure - Repeat echo significant  for improved EF of 25-30% from 15% with grade 2 diastolic dysfunction. Has been adequately diuresed, currently euvolemic. He uses when necessary low-dose Lasix gets dehydrated fast.   H. pylori infection. Treated with triple antibiotic treatment.   Paroxysmal atrial fibrillation  Mali vasc 2 score  of at least 5. continue metoprolol and for now on Eliquis  Bilateral basilar atelectasis  -continue to encourage incentive spirometry  Hypernatremia - urine concentrated, hold Lasix , resolved after hydration with D5W. 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   Arterial thrombosis  Right brachial artery. Secondary to A-line - provoked, S/p thrombectomy on 11-30-16, on Anticoagulation with Eliquis now.  Bilateral arm edema No DVT on ultrasound, diuresis as possible.   Fatty liver - supportive care  Pacer firing -  Read as asystole which does not appear to be the case. No arrhythmia noted. Seen by cardiology for device interrogation.  Cognitive impairment - mild delirium. Monitor.    Diabetes mellitus, type 2 A1c of 5.8%, - continue SSI  CBG (last 3)   Recent Labs  01/01/17 0756 01/01/17 1635 01/01/17 1730  GLUCAP 73 64* 97      DVT prophylaxis: Eliquis Code Status: DNR Family Communication:  daughter at bedside on 9/15 Disposition Plan: difficult placement due to large abdominal wound, case discussed with social worker over the phone on 9/15  Plan to d/c to SNF with doxycycline indefinitely, with palliative care following. family will consider comfort measures if patient deteriorate.   Consultants:   General surgery  Interventional radiology  Palliative care medicine  ID  Procedures:   8/10- ex lap- patch repair of pyloric ucler  8/13- brachial embolectomy  8/12- EF 15%, diffuse hypokinesis and akinesis of inferolateral/inferioir myocardium  9/6- Percutaneous  drain  9/7- Echocardiogram: EF of 18-29%, grade 2 diastolic dysfunction. No evidence of vegetations  Anti-infectives    Start     Dose/Rate Route Frequency Ordered Stop   01/01/17 1200  doxycycline (VIBRA-TABS) tablet 100 mg     100 mg Oral Every 12 hours 01/01/17 1157     12/30/16 1230  fluconazole (DIFLUCAN) IVPB 200 mg  Status:  Discontinued     200 mg 100 mL/hr over 60 Minutes Intravenous Every 24 hours 12/30/16 1127 01/01/17 1203   12/25/16 1600  DAPTOmycin (CUBICIN) 500 mg in sodium chloride 0.9 % IVPB  Status:  Discontinued     500 mg 220 mL/hr over 30 Minutes Intravenous Every 24 hours 12/25/16 1002 01/01/17 1157   12/25/16 1300  DAPTOmycin (CUBICIN) 500 mg in sodium chloride 0.9 % IVPB  Status:  Discontinued     500 mg 220 mL/hr over 30 Minutes Intravenous Every 24 hours 12/25/16 0956 12/25/16 1002   12/23/16 1800  vancomycin (VANCOCIN) IVPB 750 mg/150 ml premix  Status:  Discontinued     750 mg 150 mL/hr over 60 Minutes Intravenous Every 12 hours 12/23/16 0310 12/25/16 0941   12/23/16 0145  vancomycin (VANCOCIN) 1,750 mg in sodium chloride 0.9 % 500 mL IVPB     1,750 mg 250 mL/hr over 120 Minutes Intravenous  Once 12/23/16 0138 12/23/16 0353   12/21/16 1500  piperacillin-tazobactam (ZOSYN) IVPB 3.375 g  Status:  Discontinued     3.375 g 12.5 mL/hr over 240 Minutes  Intravenous Every 8 hours 12/21/16 1456 01/01/17 1203   12/13/16 1000  amoxicillin (AMOXIL) 250 MG/5ML suspension 1,000 mg     1,000 mg Per Tube Every 12 hours 12/13/16 0935 12/16/16 1833   12/12/16 1900  piperacillin-tazobactam (ZOSYN) IVPB 3.375 g  Status:  Discontinued     3.375 g 12.5 mL/hr over 240 Minutes Intravenous Every 8 hours 12/12/16 1759 12/13/16 0911   12/12/16 1400  Ampicillin-Sulbactam (UNASYN) 3 g in sodium chloride 0.9 % 100 mL IVPB  Status:  Discontinued     3 g 200 mL/hr over 30 Minutes Intravenous Every 6 hours 12/12/16 1215 12/12/16 1753   12/10/16 0600  amoxicillin (AMOXIL) 250 MG/5ML  suspension 1,000 mg  Status:  Discontinued     1,000 mg Per Tube Every 12 hours 12/09/16 1701 12/12/16 1206   12/09/16 1130  amoxicillin (AMOXIL) 250 MG/5ML suspension 1,000 mg  Status:  Discontinued     1,000 mg Oral Every 12 hours 12/09/16 1117 12/09/16 1118   12/09/16 1130  amoxicillin (AMOXIL) 250 MG/5ML suspension 1,000 mg  Status:  Discontinued     1,000 mg Per Tube Every 12 hours 12/09/16 1118 12/09/16 1701   12/04/16 2200  clarithromycin (BIAXIN) tablet 500 mg     500 mg Per Tube Every 12 hours 12/04/16 1107 12/16/16 2158   12/04/16 2000  piperacillin-tazobactam (ZOSYN) IVPB 3.375 g     3.375 g 12.5 mL/hr over 240 Minutes Intravenous Every 8 hours 12/04/16 1339 12/08/16 2359   12/04/16 1600  metroNIDAZOLE (FLAGYL) tablet 500 mg  Status:  Discontinued     500 mg Per Tube Every 8 hours 12/04/16 1107 12/09/16 1117   12/02/16 1100  clarithromycin (BIAXIN) 250 MG/5ML suspension 500 mg  Status:  Discontinued     500 mg Per Tube Every 12 hours 12/02/16 1012 12/04/16 1107   12/02/16 1045  metroNIDAZOLE (FLAGYL) 50 mg/ml oral suspension 500 mg  Status:  Discontinued     500 mg Per Tube 3 times daily 12/02/16 1036 12/04/16 1107   12/02/16 1015  metroNIDAZOLE (FLAGYL) 50 mg/ml oral suspension 250 mg  Status:  Discontinued     250 mg Per Tube 4 times daily 12/02/16 1011 12/02/16 1036   11/28/16 0800  fluconazole (DIFLUCAN) IVPB 200 mg  Status:  Discontinued     200 mg 100 mL/hr over 60 Minutes Intravenous Every 24 hours 11/27/16 0624 12/02/16 1018   11/27/16 0630  fluconazole (DIFLUCAN) IVPB 400 mg     400 mg 100 mL/hr over 120 Minutes Intravenous  Once 11/27/16 0622 11/27/16 0841   11/27/16 0600  piperacillin-tazobactam (ZOSYN) IVPB 3.375 g  Status:  Discontinued     3.375 g 12.5 mL/hr over 240 Minutes Intravenous Every 8 hours 11/27/16 0534 12/04/16 1339   11/27/16 0515  piperacillin-tazobactam (ZOSYN) IVPB 3.375 g  Status:  Discontinued     3.375 g 100 mL/hr over 30 Minutes Intravenous   Once 11/27/16 0510 11/27/16 0518   11/27/16 0030  piperacillin-tazobactam (ZOSYN) IVPB 3.375 g     3.375 g 100 mL/hr over 30 Minutes Intravenous  Once 11/27/16 0016 11/27/16 0755   11/27/16 0030  vancomycin (VANCOCIN) IVPB 1000 mg/200 mL premix     1,000 mg 200 mL/hr over 60 Minutes Intravenous  Once 11/27/16 0016 11/27/16 0756        Objective:  Vitals:   01/03/17 0328 01/03/17 0400 01/03/17 0500 01/03/17 0743  BP:  (!) 103/57 (!) 115/59 99/61  Pulse:  65 64   Resp:  Marland Kitchen)  22 (!) 26   Temp: 97.9 F (36.6 C)   98.7 F (37.1 C)  TempSrc: Oral   Axillary  SpO2:  94% 98%   Weight:      Height:        Intake/Output Summary (Last 24 hours) at 01/03/17 1010 Last data filed at 01/02/17 2203  Gross per 24 hour  Intake                0 ml  Output              500 ml  Net             -500 ml   Filed Weights   12/29/16 0300 12/30/16 0400 01/02/17 0349  Weight: 82.5 kg (181 lb 14.1 oz) 84.3 kg (185 lb 13.6 oz) 84.6 kg (186 lb 8.2 oz)    Examination:  Awake Alert, not oriented to time but to person and place,  No new F.N deficits, Normal affect [place].AT,PERRAL Supple Neck,No JVD, No cervical lymphadenopathy appriciated.  Symmetrical Chest wall movement, decrease at bilateral basis, no rales ,no wheezing RRR,No Gallops,Rubs or new Murmurs, No Parasternal Heave +ve B.Sounds,  Abd bandage/binder in place, liquid brown stool through fistula Bilateral arm edema, no lower extremity edema,  No new Rash or bruise      Data Reviewed: I have personally reviewed following labs and imaging studies  CBC:  Recent Labs Lab 12/28/16 0534 12/30/16 0634 01/01/17 0831 01/02/17 0358  WBC 6.9 5.7 4.2 3.9*  NEUTROABS  --   --   --  2.3  HGB 9.0* 9.3* 10.2* 9.4*  HCT 30.2* 30.2* 31.6* 30.4*  MCV 107.1* 108.2* 105.3* 105.2*  PLT 343 304 242 242   Basic Metabolic Panel:  Recent Labs Lab 12/28/16 0534 12/29/16 0908 12/30/16 0352 01/01/17 0831 01/03/17 0408  NA 147* 148* 146* 137  140  K 3.1* 3.7 4.4 3.6 3.1*  CL 116* 118* 118* 108 111  CO2 23 25 19* 21* 22  GLUCOSE 110* 78 60* 76 79  BUN 22* 25* 24* 10 9  CREATININE 0.82 0.85 1.00 0.75 0.63  CALCIUM 7.7* 7.8* 8.0* 7.7* 8.0*  MG  --   --   --   --  1.8   GFR: Estimated Creatinine Clearance: 92.9 mL/min (by C-G formula based on SCr of 0.63 mg/dL). Liver Function Tests:  Recent Labs Lab 12/28/16 0534  AST 39  ALT 49  ALKPHOS 92  BILITOT 0.6  PROT 5.7*  ALBUMIN 2.0*   No results for input(s): LIPASE, AMYLASE in the last 168 hours. No results for input(s): AMMONIA in the last 168 hours. Coagulation Profile: No results for input(s): INR, PROTIME in the last 168 hours. Cardiac Enzymes:  Recent Labs Lab 01/01/17 0831  CKTOTAL 49   BNP (last 3 results) No results for input(s): PROBNP in the last 8760 hours. HbA1C: No results for input(s): HGBA1C in the last 72 hours. CBG:  Recent Labs Lab 12/30/16 1807 12/30/16 1910 01/01/17 0756 01/01/17 1635 01/01/17 1730  GLUCAP 122* 124* 73 64* 97   Lipid Profile: No results for input(s): CHOL, HDL, LDLCALC, TRIG, CHOLHDL, LDLDIRECT in the last 72 hours. Thyroid Function Tests: No results for input(s): TSH, T4TOTAL, FREET4, T3FREE, THYROIDAB in the last 72 hours. Anemia Panel: No results for input(s): VITAMINB12, FOLATE, FERRITIN, TIBC, IRON, RETICCTPCT in the last 72 hours. Sepsis Labs: No results for input(s): PROCALCITON, LATICACIDVEN in the last 168 hours.  Recent Results (from the past 240 hour(s))  Body fluid culture     Status: Abnormal   Collection Time: 12/24/16 12:13 PM  Result Value Ref Range Status   Specimen Description FLUID  Final   Special Requests ASCITIES  Final   Gram Stain   Final    RARE WBC PRESENT,BOTH PMN AND MONONUCLEAR NO ORGANISMS SEEN    Culture (A)  Final    MULTIPLE ORGANISMS PRESENT, NONE PREDOMINANT CRITICAL RESULT CALLED TO, READ BACK BY AND VERIFIED WITH: A. DAVIS RN, AT 1429 12/25/16 BY D. VANHOOK REGARDING  CULTURE GROWTH    Report Status 12/26/2016 FINAL  Final  Culture, blood (routine x 2)     Status: None   Collection Time: 12/25/16  2:21 AM  Result Value Ref Range Status   Specimen Description BLOOD LEFT HAND  Final   Special Requests IN PEDIATRIC BOTTLE Blood Culture adequate volume  Final   Culture NO GROWTH 5 DAYS  Final   Report Status 12/30/2016 FINAL  Final  Culture, blood (routine x 2)     Status: Abnormal   Collection Time: 12/25/16  2:35 AM  Result Value Ref Range Status   Specimen Description BLOOD LEFT WRIST  Final   Special Requests IN PEDIATRIC BOTTLE Blood Culture adequate volume  Final   Culture  Setup Time   Final    GRAM POSITIVE COCCI IN CLUSTERS IN PEDIATRIC BOTTLE CRITICAL RESULT CALLED TO, READ BACK BY AND VERIFIED WITH: V.BRYK PHARMD 12/30/16 0342 L.CHAMPION    Culture (A)  Final    STAPHYLOCOCCUS SPECIES (COAGULASE NEGATIVE) THE SIGNIFICANCE OF ISOLATING THIS ORGANISM FROM A SINGLE SET OF BLOOD CULTURES WHEN MULTIPLE SETS ARE DRAWN IS UNCERTAIN. PLEASE NOTIFY THE MICROBIOLOGY DEPARTMENT WITHIN ONE WEEK IF SPECIATION AND SENSITIVITIES ARE REQUIRED.    Report Status 12/31/2016 FINAL  Final  Blood Culture ID Panel (Reflexed)     Status: Abnormal   Collection Time: 12/25/16  2:35 AM  Result Value Ref Range Status   Enterococcus species NOT DETECTED NOT DETECTED Final   Vancomycin resistance NOT DETECTED NOT DETECTED Final   Listeria monocytogenes NOT DETECTED NOT DETECTED Final   Staphylococcus species DETECTED (A) NOT DETECTED Final    Comment: CRITICAL RESULT CALLED TO, READ BACK BY AND VERIFIED WITH: V.BRYK PHARMD 12/30/16 0342 L.CHAMPION    Staphylococcus aureus NOT DETECTED NOT DETECTED Final   Methicillin resistance DETECTED (A) NOT DETECTED Final    Comment: CRITICAL RESULT CALLED TO, READ BACK BY AND VERIFIED WITH: V.BRYK PHARMD 12/30/16 0342 L.CHAMPION    Streptococcus species NOT DETECTED NOT DETECTED Final   Streptococcus agalactiae NOT DETECTED  NOT DETECTED Final   Streptococcus pneumoniae NOT DETECTED NOT DETECTED Final   Streptococcus pyogenes NOT DETECTED NOT DETECTED Final   Acinetobacter baumannii NOT DETECTED NOT DETECTED Final   Enterobacteriaceae species NOT DETECTED NOT DETECTED Final   Enterobacter cloacae complex NOT DETECTED NOT DETECTED Final   Escherichia coli NOT DETECTED NOT DETECTED Final   Klebsiella oxytoca NOT DETECTED NOT DETECTED Final   Klebsiella pneumoniae NOT DETECTED NOT DETECTED Final   Proteus species NOT DETECTED NOT DETECTED Final   Serratia marcescens NOT DETECTED NOT DETECTED Final   Carbapenem resistance NOT DETECTED NOT DETECTED Final   Haemophilus influenzae NOT DETECTED NOT DETECTED Final   Neisseria meningitidis NOT DETECTED NOT DETECTED Final   Pseudomonas aeruginosa NOT DETECTED NOT DETECTED Final   Candida albicans NOT DETECTED NOT DETECTED Final   Candida glabrata NOT DETECTED NOT DETECTED Final   Candida krusei NOT DETECTED NOT DETECTED Final  Candida parapsilosis NOT DETECTED NOT DETECTED Final   Candida tropicalis NOT DETECTED NOT DETECTED Final  Culture, blood (Routine X 2) w Reflex to ID Panel     Status: None (Preliminary result)   Collection Time: 01/01/17 12:45 PM  Result Value Ref Range Status   Specimen Description BLOOD LEFT ARM  Final   Special Requests IN PEDIATRIC BOTTLE Blood Culture adequate volume  Final   Culture NO GROWTH 2 DAYS  Final   Report Status PENDING  Incomplete     Radiology Studies: No results found.   Scheduled Meds: . apixaban  5 mg Oral Q12H  . aspirin  81 mg Per Tube Daily  . chlorhexidine gluconate (MEDLINE KIT)  15 mL Mouth Rinse BID  . collagenase   Topical Daily  . doxycycline  100 mg Oral Q12H  . lidocaine (PF)  5 mL Intradermal Once  . mouth rinse  15 mL Mouth Rinse QID  . midodrine  2.5 mg Oral TID WC  . pantoprazole  40 mg Oral BID  . potassium chloride  20 mEq Oral BID  . sodium chloride flush  5 mL Intravenous Q8H    Continuous Infusions:    LOS: 42 days   Signature  Stephanieann Popescu M.D PhD on 01/03/2017 at 10:10 AM  Between 7am to 7pm - Pager - [phone] ( page via [email or web address], text pages only, please mention full 10 digit call back number).  After 7pm go to [email or web address] - password [email or web address]

## 2017-01-03 NOTE — Progress Notes (Signed)
Patient ID: Jacob Pittman, male   DOB: 05/29/1945, 71 y.o.   MRN: [id number]  [facility] Progress Note  34 Days Post-Op  Subjective: CC- Colocutaneous fistula No complaints this morning. Denies any abdominal pain. Tolerating diet. Denies n/v. Working towards SNF.  Objective: Vital signs in last 24 hours: Temp:  [97.4 F (36.3 C)-98.7 F (37.1 C)] 98.7 F (37.1 C) (09/16 0743) Pulse Rate:  [36-87] 64 (09/16 0500) Resp:  [18-32] 26 (09/16 0500) BP: (95-132)/(42-78) 99/61 (09/16 0743) SpO2:  [91 %-100 %] 98 % (09/16 0500) Last BM Date: 01/02/17  Intake/Output from previous day: 09/15 0701 - 09/16 0700 In: -  Out: 500 [Urine:500] Intake/Output this shift: No intake/output data recorded.  PE: Gen: Alert, NAD, cooperative, pleasant HEENT: EOM's intact, pupils equal and round Pulm: CTAB, no W/R/R, effort normal Abd: Soft, mild distension, +BS, open midline incision with Eakins pouch in place and small amount liquid brown drainage in bag, no abdominal tenderness Ext: edema noted to BUE   Lab Results:   Recent Labs  01/01/17 0831 01/02/17 0358  WBC 4.2 3.9*  HGB 10.2* 9.4*  HCT 31.6* 30.4*  PLT 242 287   BMET  Recent Labs  01/01/17 0831 01/03/17 0408  NA 137 140  K 3.6 3.1*  CL 108 111  CO2 21* 22  GLUCOSE 76 79  BUN 10 9  CREATININE 0.75 0.63  CALCIUM 7.7* 8.0*   PT/INR No results for input(s): LABPROT, INR in the last 72 hours. CMP     Component Value Date/Time   NA 140 01/03/2017 0408   K 3.1 (L) 01/03/2017 0408   CL 111 01/03/2017 0408   CO2 22 01/03/2017 0408   GLUCOSE 79 01/03/2017 0408   BUN 9 01/03/2017 0408   CREATININE 0.63 01/03/2017 0408   CREATININE 0.91 03/04/2015 1618   CALCIUM 8.0 (L) 01/03/2017 0408   PROT 5.7 (L) 12/28/2016 0534   ALBUMIN 2.0 (L) 12/28/2016 0534   AST 39 12/28/2016 0534   ALT 49 12/28/2016 0534   ALKPHOS 92 12/28/2016 0534   BILITOT 0.6 12/28/2016 0534   GFRNONAA >60 01/03/2017 0408   GFRAA  >60 01/03/2017 0408   Lipase     Component Value Date/Time   LIPASE 20 12/01/2016 0304       Studies/Results: No results found.  Anti-infectives: Anti-infectives    Start     Dose/Rate Route Frequency Ordered Stop   01/01/17 1200  doxycycline (VIBRA-TABS) tablet 100 mg     100 mg Oral Every 12 hours 01/01/17 1157     12/30/16 1230  fluconazole (DIFLUCAN) IVPB 200 mg  Status:  Discontinued     200 mg 100 mL/hr over 60 Minutes Intravenous Every 24 hours 12/30/16 1127 01/01/17 1203   12/25/16 1600  DAPTOmycin (CUBICIN) 500 mg in sodium chloride 0.9 % IVPB  Status:  Discontinued     500 mg 220 mL/hr over 30 Minutes Intravenous Every 24 hours 12/25/16 1002 01/01/17 1157   12/25/16 1300  DAPTOmycin (CUBICIN) 500 mg in sodium chloride 0.9 % IVPB  Status:  Discontinued     500 mg 220 mL/hr over 30 Minutes Intravenous Every 24 hours 12/25/16 0956 12/25/16 1002   12/23/16 1800  vancomycin (VANCOCIN) IVPB 750 mg/150 ml premix  Status:  Discontinued     750 mg 150 mL/hr over 60 Minutes Intravenous Every 12 hours 12/23/16 0310 12/25/16 0941   12/23/16 0145  vancomycin (VANCOCIN) 1,750 mg in sodium chloride 0.9 % 500 mL IVPB  1,750 mg 250 mL/hr over 120 Minutes Intravenous  Once 12/23/16 0138 12/23/16 0353   12/21/16 1500  piperacillin-tazobactam (ZOSYN) IVPB 3.375 g  Status:  Discontinued     3.375 g 12.5 mL/hr over 240 Minutes Intravenous Every 8 hours 12/21/16 1456 01/01/17 1203   12/13/16 1000  amoxicillin (AMOXIL) 250 MG/5ML suspension 1,000 mg     1,000 mg Per Tube Every 12 hours 12/13/16 0935 12/16/16 1833   12/12/16 1900  piperacillin-tazobactam (ZOSYN) IVPB 3.375 g  Status:  Discontinued     3.375 g 12.5 mL/hr over 240 Minutes Intravenous Every 8 hours 12/12/16 1759 12/13/16 0911   12/12/16 1400  Ampicillin-Sulbactam (UNASYN) 3 g in sodium chloride 0.9 % 100 mL IVPB  Status:  Discontinued     3 g 200 mL/hr over 30 Minutes Intravenous Every 6 hours 12/12/16 1215 12/12/16 1753    12/10/16 0600  amoxicillin (AMOXIL) 250 MG/5ML suspension 1,000 mg  Status:  Discontinued     1,000 mg Per Tube Every 12 hours 12/09/16 1701 12/12/16 1206   12/09/16 1130  amoxicillin (AMOXIL) 250 MG/5ML suspension 1,000 mg  Status:  Discontinued     1,000 mg Oral Every 12 hours 12/09/16 1117 12/09/16 1118   12/09/16 1130  amoxicillin (AMOXIL) 250 MG/5ML suspension 1,000 mg  Status:  Discontinued     1,000 mg Per Tube Every 12 hours 12/09/16 1118 12/09/16 1701   12/04/16 2200  clarithromycin (BIAXIN) tablet 500 mg     500 mg Per Tube Every 12 hours 12/04/16 1107 12/16/16 2158   12/04/16 2000  piperacillin-tazobactam (ZOSYN) IVPB 3.375 g     3.375 g 12.5 mL/hr over 240 Minutes Intravenous Every 8 hours 12/04/16 1339 12/08/16 2359   12/04/16 1600  metroNIDAZOLE (FLAGYL) tablet 500 mg  Status:  Discontinued     500 mg Per Tube Every 8 hours 12/04/16 1107 12/09/16 1117   12/02/16 1100  clarithromycin (BIAXIN) 250 MG/5ML suspension 500 mg  Status:  Discontinued     500 mg Per Tube Every 12 hours 12/02/16 1012 12/04/16 1107   12/02/16 1045  metroNIDAZOLE (FLAGYL) 50 mg/ml oral suspension 500 mg  Status:  Discontinued     500 mg Per Tube 3 times daily 12/02/16 1036 12/04/16 1107   12/02/16 1015  metroNIDAZOLE (FLAGYL) 50 mg/ml oral suspension 250 mg  Status:  Discontinued     250 mg Per Tube 4 times daily 12/02/16 1011 12/02/16 1036   11/28/16 0800  fluconazole (DIFLUCAN) IVPB 200 mg  Status:  Discontinued     200 mg 100 mL/hr over 60 Minutes Intravenous Every 24 hours 11/27/16 0624 12/02/16 1018   11/27/16 0630  fluconazole (DIFLUCAN) IVPB 400 mg     400 mg 100 mL/hr over 120 Minutes Intravenous  Once 11/27/16 0622 11/27/16 0841   11/27/16 0600  piperacillin-tazobactam (ZOSYN) IVPB 3.375 g  Status:  Discontinued     3.375 g 12.5 mL/hr over 240 Minutes Intravenous Every 8 hours 11/27/16 0534 12/04/16 1339   11/27/16 0515  piperacillin-tazobactam (ZOSYN) IVPB 3.375 g  Status:  Discontinued      3.375 g 100 mL/hr over 30 Minutes Intravenous  Once 11/27/16 0510 11/27/16 0518   11/27/16 0030  piperacillin-tazobactam (ZOSYN) IVPB 3.375 g     3.375 g 100 mL/hr over 30 Minutes Intravenous  Once 11/27/16 0016 11/27/16 0755   11/27/16 0030  vancomycin (VANCOCIN) IVPB 1000 mg/200 mL premix     1,000 mg 200 mL/hr over 60 Minutes Intravenous  Once 11/27/16  0016 11/27/16 0756       Assessment/Plan Perforated pyloric ulcer  S/P EXPLORATORY LAPAROTOMY, GRAHAM PATCH REPAIR8/10/18 Dr. 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 Darrick Penna CVA - on Eliquis Malnutrition - prealbumin 8.2 (9/7) Hypernatremia/hyperchloremia - resolved Dysphagia DM Acute renal failure - improved Code status DNR  FEN: dyphagia 3 diet ID:  zosyn 8/9- 12/08/16, Diflucan 8/9-8/15/18, clarithromycin 8/17 - 8/29,Amoxicillin 8/23-29/18 =>>day 15 of antibiotics  Vancomycin restarted 9/5>>9/7,Zosyn restarted 9/3>>9/14,Daptomycin   9/7>>9/14, Diflucan restarted 9/12>>9/14,  Doxycycline 9/14>>day#3 (indefinite for possible endocarditis/infxn of his ICD) DVT: Eliquis  Plan: Continue Eakins pouch and diet. Replace potassium. Working towards SNF.   LOS: 37 days    Franne Forts , [facility] 01/03/2017, 7:46 AM Pager: [phone] Consults: [phone] Mon-Fri 7:00 am-4:30 pm Sat-Sun 7:00 am-11:30 am

## 2017-01-04 LAB — BASIC METABOLIC PANEL
ANION GAP: 7 (ref 5–15)
BUN: 11 mg/dL (ref 6–20)
CALCIUM: 8.2 mg/dL — AB (ref 8.9–10.3)
CO2: 25 mmol/L (ref 22–32)
Chloride: 110 mmol/L (ref 101–111)
Creatinine, Ser: 0.73 mg/dL (ref 0.61–1.24)
GFR calc Af Amer: >60 mL/min (ref >60)
GLUCOSE: 81 mg/dL (ref 65–99)
Potassium: 3.9 mmol/L (ref 3.5–5.1)
SODIUM: 142 mmol/L (ref 135–145)

## 2017-01-04 LAB — CBC
HCT: 30 % — ABNORMAL LOW (ref 39.0–52.0)
Hemoglobin: 9.3 g/dL — ABNORMAL LOW (ref 13.0–17.0)
MCH: 32.5 pg (ref 26.0–34.0)
MCHC: 31 g/dL (ref 30.0–36.0)
MCV: 104.9 fL — AB (ref 78.0–100.0)
PLATELETS: 284 10*3/uL (ref 150–400)
RBC: 2.86 MIL/uL — AB (ref 4.22–5.81)
RDW: 16.5 % — AB (ref 11.5–15.5)
WBC: 4.1 10*3/uL (ref 4.0–10.5)

## 2017-01-04 LAB — GLUCOSE, CAPILLARY
GLUCOSE-CAPILLARY: 75 mg/dL (ref 65–99)
Glucose-Capillary: 85 mg/dL (ref 65–99)
Glucose-Capillary: 81 mg/dL (ref 65–99)
Glucose-Capillary: 69 mg/dL (ref 65–99)

## 2017-01-04 LAB — MAGNESIUM: MAGNESIUM: 1.8 mg/dL (ref 1.7–2.4)

## 2017-01-04 MED ORDER — ENSURE ENLIVE PO LIQD
237.0000 mL | Freq: Two times a day (BID) | ORAL | Status: DC
  Administered 2017-01-04 – 2017-01-13 (×16): 237 mL via ORAL

## 2017-01-04 NOTE — Consult Note (Signed)
WOC by to assess fistula pouch, working well. Will plan 1x wk pouch change.  WOC will assess sacral wound this week with pouch change.   Joshlynn Alfonzo [facility], CNS, [employer] [phone]

## 2017-01-04 NOTE — Progress Notes (Signed)
PROGRESS NOTE    Jacob Pittman  LGJ:[id number] DOB: 13-Jun-1945 DOA: 11/26/2016 PCP: Jacob Levins, MD   Brief Narrative:  Jacob Pittman is a 71 y.o. 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 Was found to have a perforated pyloric channel ulcer and pneumoperitonitis s/p omental patch repair subsequently on epinephrine and Levophed.  8/12-  TTE LV moderately dilated with EF 15% &diffuse hypokinesis. There is akinesis of the inferolateral and inferior myocardium.  E coli UTI noted 8/13-  right brachial A- line placed and then developed ischemic right arm- vascular consult and transfer from [facility] to [facility]- thrombectomy of brachial artery- heparin Paroxysmal A-fib also noted 8/14- Decreased attenuation involving the right dentate nucleus in the right cerebellum and immediately adjacent cerebellar parenchyma, concerning for recent and potentially acute infarct in this area.  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- anticoagulation recommended to be stopped for 10-14 days - H pyloli + started on triple therapy - Transaminitis thought to be du to Diflucan hepatotoxicity 8/17 - febrile again and vasopressors resumed 8/23- extubated 8/25 A-fib with HR up to 160s, fever 101.6 at 4 AM- care transferred to [facility] 9/3 - stool noted to be coming out of wound   Subjective:   Overnight events noted, recurrent urinary retention and had to have Foley catheter placed back. Tolerating diet without nausea or vomiting. Mild intermittent abdominal pain. As per RN, no acute issues reported.   Assessment & Plan:   Acute urinary retention on 9/15-9/16, ua on 9/15 with rbc TNTC, hgb stable Insert foley, hold asa, continue elliquis. Will need to discharge on Foley catheter until outpatient evaluation by urology in approximately 2 weeks.  Hypokalemia: Replaced.  Perforated duodenal bulb ulcer,  Peritonitis, H. Pylori ulcer, Septic shock with Abdominal Abscess and colocutaneous fistula.  Patient has had a prolonged hospital course secondary to above problems. Patient has received multiple antibiotic therapies. He had evidence of a abscess tracking along sigmoid colon mesentery/omentum with drainage through the wound, evidence of wound necrosis. S/p drain placement with cultures significant for multiple organisms. Drain not outputting much, IR removed the drain on 12/30/2016, general surgery following closely along with ID.   Previous hospitalist discussed the case with both general surgery and ID on 12/31/2016, fistula will be left open and will be covered with Eikin pouch by wound care, continue oral diet speech to clear. Patient wants to continue medical treatment but wants to be DO NOT RESUSCITATE, does not wish any further heroics, if medical treatment does not make him better he will consider hospice. He has had adequate antibiotic treatment for over 2-3 weeks for his abdominal issues. Zosyn and fluconazole , last dose on 01/01/2017 and monitor clinically. As per ID follow-up 9/17: Culture was polymicrobial and recommend switching to Augmentin at discharge for 2 more weeks. However upon chart review, patient has not been on IV daptomycin since 9/13 and IV Zosyn since 9/14. We will review recommendation regarding Augmentin with ID again on 9/18.   Methicillin resistant staph species bacteremia  - Seen on 9/3 blood cultures x2. IJ removed 12/24/16, however repeat blood cultures 1 out of 2 again positive, discussed with ID physician Jacob Pittman in detail on 01/01/2017, at this time there is suspicion that patient's AICD might be infected however removal of AICD will undertake a major surgery, patient only wishes medical treatment does not want headache procedures, ID physician also  had detailed discussion with patient's daughter. At this time plan is to switch him to oral doxycycline indefinitely  for chronic suppressive treatment, no further cultures, if clinically declines and infection becomes worse comfort measures.  Tachypnea - due to atelectasis along with oral secretions and possible Pulmonary edema on imaging, resolved he is at baseline oxygen demand exam stable.  Acute on chronic systolic and diastolic heart failure - Repeat echo significant for improved EF of 25-30% from 15% with grade 2 diastolic dysfunction. Significant peripheral/mostly upper extremity edema likely due to CHF and nutritional. May consider low-dose Lasix.  H. pylori infection. Treated with triple antibiotic treatment.   Paroxysmal atrial fibrillation  Mali vasc 2 score  of at least 5. continue metoprolol and for now on Eliquis  Bilateral basilar atelectasis  -continue to encourage incentive spirometry  Hypernatremia - resolved   CVA Likely embolic from atrial fibrillation, currently Eliquis, asa discontinued on 9/16 due to rbc TNTC in urine.   Arterial thrombosis  Right brachial artery. Secondary to A-line - provoked, S/p thrombectomy on 11-30-16, on Anticoagulation with Eliquis now.  Bilateral arm edema No DVT on ultrasound, diuresis as possible.   Fatty liver - supportive care  Pacer firing -  Read as asystole which does not appear to be the case. No arrhythmia noted. Seen by cardiology for device interrogation.  Cognitive impairment - mild. No delirium at this time.    Diabetes mellitus, type 2 A1c of 5.8%, - continue SSI  CBG (last 3)   Recent Labs  01/04/17 0740 01/04/17 1223 01/04/17 1614  GLUCAP 75 81 69   Adult failure to thrive Multifactorial due to multiple severe significant comorbidities. Palliative care input pending.   DVT prophylaxis: Eliquis Code Status: DNR Family Communication:  None at bedside today. Disposition Plan: difficult placement due to large abdominal wound. Plan to d/c to SNF with doxycycline indefinitely, with palliative care following. family will  consider comfort measures if patient deteriorate.   Consultants:   General surgery  Interventional radiology  Palliative care medicine  ID  Procedures:   8/10- ex lap- patch repair of pyloric ucler  8/13- brachial embolectomy  8/12- EF 15%, diffuse hypokinesis and akinesis of inferolateral/inferioir myocardium  9/6- Percutaneous drain  9/7- Echocardiogram: EF of 91-63%, grade 2 diastolic dysfunction. No evidence of vegetations  Antimicrobials Currently only on oral doxycycline.     Objective:  Vitals:   01/04/17 0300 01/04/17 0741 01/04/17 1225 01/04/17 1615  BP: 112/66 94/73 (!) 94/59 (!) 88/60  Pulse: 80     Resp: (!) 29     Temp: 98.5 F (36.9 C) (!) 97.3 F (36.3 C) (!) 97.4 F (36.3 C) 97.8 F (36.6 C)  TempSrc: Oral Oral Oral Oral  SpO2: 98%     Weight:      Height:        Intake/Output Summary (Last 24 hours) at 01/04/17 1854 Last data filed at 01/04/17 1800  Gross per 24 hour  Intake             1020 ml  Output              600 ml  Net              420 ml   Filed Weights   12/29/16 0300 12/30/16 0400 01/02/17 0349  Weight: 82.5 kg (181 lb 14.1 oz) 84.3 kg (185 lb 13.6 oz) 84.6 kg (186 lb 8.2 oz)    Examination:  Gen. exam: Pleasant elderly male, moderately  built and nourished, lying comfortably propped up in bed. Respiratory system: Diminished breath sounds in the bases but no crackles. Rest of lung fields clear to auscultation. No increased work of breathing. Cardiovascular system: S1 and S2 heard, irregularly irregular. No JVD or murmurs. 1+ pitting bilateral leg edema and 2+ pitting bilateral upper extremity edema. Telemetry: Paroxysmal A. fib, BBB morphology. Abdominal exam: Eakin's pouch covering patient's abdominal wound draining liquidy yellow stools without bleeding. Abdominal binder in place. Abdomen nondistended, soft and nontender. Normal bowel sounds heard. Central nervous system: Alert and oriented to person and place. No focal  neurological deficits. Extremities: Significant extremity edema, per >lower. Psychiatry: Affect: Flat.     Data Reviewed: I have personally reviewed following labs and imaging studies  CBC:  Recent Labs Lab 12/30/16 0634 01/01/17 0831 01/02/17 0358 01/04/17 0400  WBC 5.7 4.2 3.9* 4.1  NEUTROABS  --   --  2.3  --   HGB 9.3* 10.2* 9.4* 9.3*  HCT 30.2* 31.6* 30.4* 30.0*  MCV 108.2* 105.3* 105.2* 104.9*  PLT 304 242 287 124   Basic Metabolic Panel:  Recent Labs Lab 12/29/16 0908 12/30/16 0352 01/01/17 0831 01/03/17 0408 01/04/17 0400  NA 148* 146* 137 140 142  K 3.7 4.4 3.6 3.1* 3.9  CL 118* 118* 108 111 110  CO2 25 19* 21* 22 25  GLUCOSE 78 60* 76 79 81  BUN 25* 24* '10 9 11  '$ CREATININE 0.85 1.00 0.75 0.63 0.73  CALCIUM 7.8* 8.0* 7.7* 8.0* 8.2*  MG  --   --   --  1.8 1.8   Cardiac Enzymes:  Recent Labs Lab 01/01/17 0831  CKTOTAL 49   CBG:  Recent Labs Lab 01/03/17 2347 01/04/17 0349 01/04/17 0740 01/04/17 1223 01/04/17 1614  GLUCAP 80 85 75 81 69     Recent Results (from the past 240 hour(s))  Culture, blood (Routine X 2) w Reflex to ID Panel     Status: None (Preliminary result)   Collection Time: 01/01/17 12:45 PM  Result Value Ref Range Status   Specimen Description BLOOD LEFT ARM  Final   Special Requests IN PEDIATRIC BOTTLE Blood Culture adequate volume  Final   Culture NO GROWTH 3 DAYS  Final   Report Status PENDING  Incomplete     Radiology Studies: No results found.   Scheduled Meds: . apixaban  5 mg Oral Q12H  . chlorhexidine gluconate (MEDLINE KIT)  15 mL Mouth Rinse BID  . collagenase   Topical Daily  . doxycycline  100 mg Oral Q12H  . feeding supplement (ENSURE ENLIVE)  237 mL Oral BID BM  . mouth rinse  15 mL Mouth Rinse QID  . midodrine  2.5 mg Oral TID WC  . pantoprazole  40 mg Oral BID  . sodium chloride flush  5 mL Intravenous Q8H   Continuous Infusions:    LOS: 57 days   Yula Crotwell, MD, FACP, FHM. [facility] Pager [phone]  If 7PM-7AM, please contact night-coverage [email or web address] Password [email or web address] 01/04/2017, 7:07 PM

## 2017-01-04 NOTE — Progress Notes (Signed)
CSW still following and attempting to find SNF option for patient- abdominal wound continuing to be a barrier  Burna Sis, LCSW Clinical Social Worker [phone]

## 2017-01-04 NOTE — Care Management Important Message (Signed)
Important Message  Patient Details  Name: Jacob Pittman MRN: [id number] Date of Birth: Nov 21, 1945   Medicare Important Message Given:  Yes    Kyla Balzarine 01/04/2017, 10:37 AM

## 2017-01-04 NOTE — Progress Notes (Signed)
  Speech Language Pathology Treatment: Dysphagia  Patient Details Name: Jacob Pittman MRN: [id number] DOB: 05/30/1945 Today's Date: 01/04/2017 Time: 4784-1282 SLP Time Calculation (min) (ACUTE ONLY): 8 min  Assessment / Plan / Recommendation Clinical Impression  Pt continues to have immediate throat clearing that follows sips of water, concerning for possible airway penetration or aspiration. However, his vocal quality and lung sounds remain unchanged, he's afebrile, and he remains on room air. I discussed again with patient the option of further testing, but he reiterates his wishes to be able to eat/drink what he wants and how he wants it. His intake with me is limited to water only, as he politely declines any solid trials. His intake per the chart has been limited to 0-40% at meals. Would continue diet order per pt wishes, knowing the potential risk of aspiration.   HPI HPI: Pt is a 71 year old man with ischemic cardiomyopathy, DM, HTN, PAD, OSA who was admitted 8/9 with gastric ulcer perforation and peritonitis. He underwent emergent laparotomy with omental patch repair, postoperatively remained in shock requiring epinephrine and Levophed drip. Course c/b acute ischemia of R arm requiring tx from [facility] to [facility] and urgent OR embolectomy, also c/b slow vent wean. He was intubated 8/10-8/23. MBS completed 8/26 showed a severe oropharyngeal dysphagia with 100% of the bolus remaining in the vallecula resulting in delayed aspiration. Last SLP visit 8/31 due to medical issues preventing therapy. Per palliative care meeting 9/12, comfort is his primary goal. He wanted to d/c cortrak, begin PO diet, and avoid TPN now and in the future. Pt was started on a soft diet and thin liquids; SLP ordered to assess oropharyngeal function.      SLP Plan  Continue with current plan of care       Recommendations  Diet recommendations: Dysphagia 3 (mechanical soft);Thin liquid (per pt wishes) Liquids provided via:  Cup;Straw Medication Administration: Crushed with puree Supervision: Staff to assist with self feeding;Full supervision/cueing for compensatory strategies Compensations: Slow rate;Small sips/bites;Multiple dry swallows after each bite/sip Postural Changes and/or Swallow Maneuvers: Seated upright 90 degrees;Upright 30-60 min after meal                Oral Care Recommendations: Oral care QID Follow up Recommendations: Skilled Nursing facility SLP Visit Diagnosis: Dysphagia, oropharyngeal phase (R13.12) Plan: Continue with current plan of care       GO                Jacob Pittman 01/04/2017, 11:06 AM  Jacob Pittman, M.A. CCC-SLP [phone]

## 2017-01-04 NOTE — Progress Notes (Signed)
Patient ID: Jacob Pittman, male   DOB: June 18, 1945, 71 y.o.   MRN: [id number]  [facility] Surgery Progress Note  35 Days Post-Op  Subjective: CC- Colocutaneous fistula Doing well this morning. No complaints. Slept well. Tolerating diet. Eakins pouch with no issues. Working towards SNF, although difficult to place.  Objective: Vital signs in last 24 hours: Temp:  [97.3 F (36.3 C)-98.5 F (36.9 C)] 97.3 F (36.3 C) (09/17 0741) Pulse Rate:  [55-80] 80 (09/17 0300) Resp:  [17-29] 29 (09/17 0300) BP: (90-112)/(56-73) 94/73 (09/17 0741) SpO2:  [98 %-100 %] 98 % (09/17 0300) Last BM Date: 01/03/17  Intake/Output from previous day: 09/16 0701 - 09/17 0700 In: 714 [P.O.:714] Out: 1075 [Urine:550; Stool:525] Intake/Output this shift: No intake/output data recorded.  PE: Gen: Alert, NAD, cooperative, pleasant HEENT: EOM's intact, pupils equal and round Pulm: effort normal Abd: Soft, nondistended, +BS, open midline incision with Eakins pouch in place/good seal and small amount liquid brown stool and air in bag, no abdominal tenderness Ext: edema noted to BUE   Lab Results:   Recent Labs  01/02/17 0358 01/04/17 0400  WBC 3.9* 4.1  HGB 9.4* 9.3*  HCT 30.4* 30.0*  PLT 287 284   BMET  Recent Labs  01/03/17 0408 01/04/17 0400  NA 140 142  K 3.1* 3.9  CL 111 110  CO2 22 25  GLUCOSE 79 81  BUN 9 11  CREATININE 0.63 0.73  CALCIUM 8.0* 8.2*   PT/INR No results for input(s): LABPROT, INR in the last 72 hours. CMP     Component Value Date/Time   NA 142 01/04/2017 0400   K 3.9 01/04/2017 0400   CL 110 01/04/2017 0400   CO2 25 01/04/2017 0400   GLUCOSE 81 01/04/2017 0400   BUN 11 01/04/2017 0400   CREATININE 0.73 01/04/2017 0400   CREATININE 0.91 03/04/2015 1618   CALCIUM 8.2 (L) 01/04/2017 0400   PROT 5.7 (L) 12/28/2016 0534   ALBUMIN 2.0 (L) 12/28/2016 0534   AST 39 12/28/2016 0534   ALT 49 12/28/2016 0534   ALKPHOS 92 12/28/2016 0534   BILITOT 0.6  12/28/2016 0534   GFRNONAA >60 01/04/2017 0400   GFRAA >60 01/04/2017 0400   Lipase     Component Value Date/Time   LIPASE 20 12/01/2016 0304       Studies/Results: No results found.  Anti-infectives: Anti-infectives    Start     Dose/Rate Route Frequency Ordered Stop   01/01/17 1200  doxycycline (VIBRA-TABS) tablet 100 mg     100 mg Oral Every 12 hours 01/01/17 1157     12/30/16 1230  fluconazole (DIFLUCAN) IVPB 200 mg  Status:  Discontinued     200 mg 100 mL/hr over 60 Minutes Intravenous Every 24 hours 12/30/16 1127 01/01/17 1203   12/25/16 1600  DAPTOmycin (CUBICIN) 500 mg in sodium chloride 0.9 % IVPB  Status:  Discontinued     500 mg 220 mL/hr over 30 Minutes Intravenous Every 24 hours 12/25/16 1002 01/01/17 1157   12/25/16 1300  DAPTOmycin (CUBICIN) 500 mg in sodium chloride 0.9 % IVPB  Status:  Discontinued     500 mg 220 mL/hr over 30 Minutes Intravenous Every 24 hours 12/25/16 0956 12/25/16 1002   12/23/16 1800  vancomycin (VANCOCIN) IVPB 750 mg/150 ml premix  Status:  Discontinued     750 mg 150 mL/hr over 60 Minutes Intravenous Every 12 hours 12/23/16 0310 12/25/16 0941   12/23/16 0145  vancomycin (VANCOCIN) 1,750 mg in sodium chloride  0.9 % 500 mL IVPB     1,750 mg 250 mL/hr over 120 Minutes Intravenous  Once 12/23/16 0138 12/23/16 0353   12/21/16 1500  piperacillin-tazobactam (ZOSYN) IVPB 3.375 g  Status:  Discontinued     3.375 g 12.5 mL/hr over 240 Minutes Intravenous Every 8 hours 12/21/16 1456 01/01/17 1203   12/13/16 1000  amoxicillin (AMOXIL) 250 MG/5ML suspension 1,000 mg     1,000 mg Per Tube Every 12 hours 12/13/16 0935 12/16/16 1833   12/12/16 1900  piperacillin-tazobactam (ZOSYN) IVPB 3.375 g  Status:  Discontinued     3.375 g 12.5 mL/hr over 240 Minutes Intravenous Every 8 hours 12/12/16 1759 12/13/16 0911   12/12/16 1400  Ampicillin-Sulbactam (UNASYN) 3 g in sodium chloride 0.9 % 100 mL IVPB  Status:  Discontinued     3 g 200 mL/hr over 30  Minutes Intravenous Every 6 hours 12/12/16 1215 12/12/16 1753   12/10/16 0600  amoxicillin (AMOXIL) 250 MG/5ML suspension 1,000 mg  Status:  Discontinued     1,000 mg Per Tube Every 12 hours 12/09/16 1701 12/12/16 1206   12/09/16 1130  amoxicillin (AMOXIL) 250 MG/5ML suspension 1,000 mg  Status:  Discontinued     1,000 mg Oral Every 12 hours 12/09/16 1117 12/09/16 1118   12/09/16 1130  amoxicillin (AMOXIL) 250 MG/5ML suspension 1,000 mg  Status:  Discontinued     1,000 mg Per Tube Every 12 hours 12/09/16 1118 12/09/16 1701   12/04/16 2200  clarithromycin (BIAXIN) tablet 500 mg     500 mg Per Tube Every 12 hours 12/04/16 1107 12/16/16 2158   12/04/16 2000  piperacillin-tazobactam (ZOSYN) IVPB 3.375 g     3.375 g 12.5 mL/hr over 240 Minutes Intravenous Every 8 hours 12/04/16 1339 12/08/16 2359   12/04/16 1600  metroNIDAZOLE (FLAGYL) tablet 500 mg  Status:  Discontinued     500 mg Per Tube Every 8 hours 12/04/16 1107 12/09/16 1117   12/02/16 1100  clarithromycin (BIAXIN) 250 MG/5ML suspension 500 mg  Status:  Discontinued     500 mg Per Tube Every 12 hours 12/02/16 1012 12/04/16 1107   12/02/16 1045  metroNIDAZOLE (FLAGYL) 50 mg/ml oral suspension 500 mg  Status:  Discontinued     500 mg Per Tube 3 times daily 12/02/16 1036 12/04/16 1107   12/02/16 1015  metroNIDAZOLE (FLAGYL) 50 mg/ml oral suspension 250 mg  Status:  Discontinued     250 mg Per Tube 4 times daily 12/02/16 1011 12/02/16 1036   11/28/16 0800  fluconazole (DIFLUCAN) IVPB 200 mg  Status:  Discontinued     200 mg 100 mL/hr over 60 Minutes Intravenous Every 24 hours 11/27/16 0624 12/02/16 1018   11/27/16 0630  fluconazole (DIFLUCAN) IVPB 400 mg     400 mg 100 mL/hr over 120 Minutes Intravenous  Once 11/27/16 0622 11/27/16 0841   11/27/16 0600  piperacillin-tazobactam (ZOSYN) IVPB 3.375 g  Status:  Discontinued     3.375 g 12.5 mL/hr over 240 Minutes Intravenous Every 8 hours 11/27/16 0534 12/04/16 1339   11/27/16 0515   piperacillin-tazobactam (ZOSYN) IVPB 3.375 g  Status:  Discontinued     3.375 g 100 mL/hr over 30 Minutes Intravenous  Once 11/27/16 0510 11/27/16 0518   11/27/16 0030  piperacillin-tazobactam (ZOSYN) IVPB 3.375 g     3.375 g 100 mL/hr over 30 Minutes Intravenous  Once 11/27/16 0016 11/27/16 0755   11/27/16 0030  vancomycin (VANCOCIN) IVPB 1000 mg/200 mL premix     1,000 mg  200 mL/hr over 60 Minutes Intravenous  Once 11/27/16 0016 11/27/16 0756       Assessment/Plan Perforated pyloric ulcer  S/P EXPLORATORY LAPAROTOMY, GRAHAM PATCH REPAIR8/10/18 Dr. 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 Darrick Penna CVA - on Eliquis Malnutrition - prealbumin 8.2 (9/7) Hypernatremia/hyperchloremia - resolved Dysphagia DM Acute renal failure - improved Code status DNR  FEN: dyphagia 3 diet ID:  zosyn 8/9- 12/08/16, Diflucan 8/9-8/15/18, clarithromycin 8/17 - 8/29,Amoxicillin 8/23-29/18 =>>day 15 of antibiotics  Vancomycin restarted 9/5>>9/7,Zosyn restarted 9/3>>9/14,Daptomycin 9/7>>9/14, Diflucan restarted 9/12>>9/14, Doxycycline 9/14>>day#4 (indefinite for possible endocarditis/infxn of his ICD) DVT: Eliquis  Plan: Eakins pouch working well. Continue diet. Working towards SNF.   LOS: 38 days    Franne Forts , [facility] Surgery 01/04/2017, 8:01 AM Pager: [phone] Consults: [phone] Mon-Fri 7:00 am-4:30 pm Sat-Sun 7:00 am-11:30 am

## 2017-01-04 NOTE — Progress Notes (Signed)
Pt. cbg 68. Given cup of orange juice. Checked 15 mins later. cbg 70 pt. Asked for an other cup of orange juice and had saltines and peanut butter.

## 2017-01-04 NOTE — Progress Notes (Signed)
INFECTIOUS DISEASE PROGRESS NOTE  ID: Jacob Pittman is a 71 y.o. 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   Abtx:  Anti-infectives    Start     Dose/Rate Route Frequency Ordered Stop   01/01/17 1200  doxycycline (VIBRA-TABS) tablet 100 mg     100 mg Oral Every 12 hours 01/01/17 1157     12/30/16 1230  fluconazole (DIFLUCAN) IVPB 200 mg  Status:  Discontinued     200 mg 100 mL/hr over 60 Minutes Intravenous Every 24 hours 12/30/16 1127 01/01/17 1203   12/25/16 1600  DAPTOmycin (CUBICIN) 500 mg in sodium chloride 0.9 % IVPB  Status:  Discontinued     500 mg 220 mL/hr over 30 Minutes Intravenous Every 24 hours 12/25/16 1002 01/01/17 1157   12/25/16 1300  DAPTOmycin (CUBICIN) 500 mg in sodium chloride 0.9 % IVPB  Status:  Discontinued     500 mg 220 mL/hr over 30 Minutes Intravenous Every 24 hours 12/25/16 0956 12/25/16 1002   12/23/16 1800  vancomycin (VANCOCIN) IVPB 750 mg/150 ml premix  Status:  Discontinued     750 mg 150 mL/hr over 60 Minutes Intravenous Every 12 hours 12/23/16 0310 12/25/16 0941   12/23/16 0145  vancomycin (VANCOCIN) 1,750 mg in sodium chloride 0.9 % 500 mL IVPB     1,750 mg 250 mL/hr over 120 Minutes Intravenous  Once 12/23/16 0138 12/23/16 0353   12/21/16 1500  piperacillin-tazobactam  (ZOSYN) IVPB 3.375 g  Status:  Discontinued     3.375 g 12.5 mL/hr over 240 Minutes Intravenous Every 8 hours 12/21/16 1456 01/01/17 1203   12/13/16 1000  amoxicillin (AMOXIL) 250 MG/5ML suspension 1,000 mg     1,000 mg Per Tube Every 12 hours 12/13/16 0935 12/16/16 1833   12/12/16 1900  piperacillin-tazobactam (ZOSYN) IVPB 3.375 g  Status:  Discontinued     3.375 g 12.5 mL/hr over 240 Minutes Intravenous Every 8 hours 12/12/16 1759 12/13/16 0911   12/12/16 1400  Ampicillin-Sulbactam (UNASYN) 3 g in sodium chloride 0.9 % 100 mL IVPB  Status:  Discontinued     3 g 200 mL/hr over 30 Minutes Intravenous Every 6 hours 12/12/16 1215 12/12/16 1753   12/10/16 0600  amoxicillin (AMOXIL) 250 MG/5ML suspension 1,000 mg  Status:  Discontinued     1,000 mg Per Tube Every 12 hours 12/09/16 1701 12/12/16 1206   12/09/16 1130  amoxicillin (AMOXIL) 250 MG/5ML suspension 1,000 mg  Status:  Discontinued     1,000 mg Oral Every 12 hours 12/09/16 1117 12/09/16 1118   12/09/16 1130  amoxicillin (AMOXIL) 250 MG/5ML suspension 1,000 mg  Status:  Discontinued     1,000 mg Per Tube Every 12 hours 12/09/16 1118 12/09/16 1701   12/04/16 2200  clarithromycin (BIAXIN) tablet 500 mg     500  mg Per Tube Every 12 hours 12/04/16 1107 12/16/16 2158   12/04/16 2000  piperacillin-tazobactam (ZOSYN) IVPB 3.375 g     3.375 g 12.5 mL/hr over 240 Minutes Intravenous Every 8 hours 12/04/16 1339 12/08/16 2359   12/04/16 1600  metroNIDAZOLE (FLAGYL) tablet 500 mg  Status:  Discontinued     500 mg Per Tube Every 8 hours 12/04/16 1107 12/09/16 1117   12/02/16 1100  clarithromycin (BIAXIN) 250 MG/5ML suspension 500 mg  Status:  Discontinued     500 mg Per Tube Every 12 hours 12/02/16 1012 12/04/16 1107   12/02/16 1045  metroNIDAZOLE (FLAGYL) 50 mg/ml oral suspension 500 mg  Status:  Discontinued     500 mg Per Tube 3 times daily 12/02/16 1036 12/04/16 1107   12/02/16 1015  metroNIDAZOLE (FLAGYL) 50 mg/ml oral suspension 250 mg   Status:  Discontinued     250 mg Per Tube 4 times daily 12/02/16 1011 12/02/16 1036   11/28/16 0800  fluconazole (DIFLUCAN) IVPB 200 mg  Status:  Discontinued     200 mg 100 mL/hr over 60 Minutes Intravenous Every 24 hours 11/27/16 0624 12/02/16 1018   11/27/16 0630  fluconazole (DIFLUCAN) IVPB 400 mg     400 mg 100 mL/hr over 120 Minutes Intravenous  Once 11/27/16 0622 11/27/16 0841   11/27/16 0600  piperacillin-tazobactam (ZOSYN) IVPB 3.375 g  Status:  Discontinued     3.375 g 12.5 mL/hr over 240 Minutes Intravenous Every 8 hours 11/27/16 0534 12/04/16 1339   11/27/16 0515  piperacillin-tazobactam (ZOSYN) IVPB 3.375 g  Status:  Discontinued     3.375 g 100 mL/hr over 30 Minutes Intravenous  Once 11/27/16 0510 11/27/16 0518   11/27/16 0030  piperacillin-tazobactam (ZOSYN) IVPB 3.375 g     3.375 g 100 mL/hr over 30 Minutes Intravenous  Once 11/27/16 0016 11/27/16 0755   11/27/16 0030  vancomycin (VANCOCIN) IVPB 1000 mg/200 mL premix     1,000 mg 200 mL/hr over 60 Minutes Intravenous  Once 11/27/16 0016 11/27/16 0756      Medications:  Scheduled: . apixaban  5 mg Oral Q12H  . chlorhexidine gluconate (MEDLINE KIT)  15 mL Mouth Rinse BID  . collagenase   Topical Daily  . doxycycline  100 mg Oral Q12H  . mouth rinse  15 mL Mouth Rinse QID  . midodrine  2.5 mg Oral TID WC  . pantoprazole  40 mg Oral BID  . sodium chloride flush  5 mL Intravenous Q8H    Objective: Vital signs in last 24 hours: Temp:  [97.3 F (36.3 C)-98.5 F (36.9 C)] 97.4 F (36.3 C) (09/17 1225) Pulse Rate:  [55-80] 80 (09/17 0300) Resp:  [17-29] 29 (09/17 0300) BP: (90-112)/(56-73) 94/59 (09/17 1225) SpO2:  [98 %-100 %] 98 % (09/17 0300)   General appearance: alert and no distress Resp: clear to auscultation bilaterally Cardio: regular rate and rhythm GI: normal findings: bowel sounds normal, soft, non-tender and wound dressed.   Lab Results  Recent Labs  01/02/17 0358 01/03/17 0408  01/04/17 0400  WBC 3.9*  --  4.1  HGB 9.4*  --  9.3*  HCT 30.4*  --  30.0*  NA  --  140 142  K  --  3.1* 3.9  CL  --  111 110  CO2  --  22 25  BUN  --  9 11  CREATININE  --  0.63 0.73   Liver Panel No results for input(s): PROT, ALBUMIN, AST, ALT, ALKPHOS, BILITOT,  BILIDIR, IBILI in the last 72 hours. Sedimentation Rate No results for input(s): ESRSEDRATE in the last 72 hours. C-Reactive Protein No results for input(s): CRP in the last 72 hours.  Microbiology: Recent Results (from the past 240 hour(s))  Culture, blood (Routine X 2) w Reflex to ID Panel     Status: None (Preliminary result)   Collection Time: 01/01/17 12:45 PM  Result Value Ref Range Status   Specimen Description BLOOD LEFT ARM  Final   Special Requests IN PEDIATRIC BOTTLE Blood Culture adequate volume  Final   Culture NO GROWTH 2 DAYS  Final   Report Status PENDING  Incomplete    Studies/Results: No results found.   Assessment/Plan: MRSE bacteremia MIC elevated (4) CK fine Central line out 9-6 Check TTE does not specifically mention endocarditis. Again, given his overall co-morbidities,  would not check TEE.  Repeat BCx sent 9-7 1/2 MRSE Day 13 of anti-MRSE anbx. Needs repeat bcx (sent) His repeat BCx suggest (but do not confirm) endocarditis, infection of his ICD.  His last BCx is ngtd (9-14) Would suggest indefinite doxy '100mg'$  bid  Perforated gastric ulcer Intra-abdominal abscess Cx- poly-microbial. Would rec he is switched to augmentin at d/c, for 2 more weeks.  Could consider repeat CT abd, not clear of goals of care once d/c to SNF.  ICD  Total days of antibiotics:  dapto 9-7 Zosyn 9-3     Available as needed.       Bobby Rumpf Infectious Diseases (pager) [phone] [email or web address] 01/04/2017, 1:58 PM  LOS: 38 days

## 2017-01-04 NOTE — Progress Notes (Signed)
Nutrition Consult/Brief Note  RD consulted for nutrition assessment/requirements. Clinical Nutrition team following since hospital admission 11/26/16. Cortrak feeding tube and TF discontinued 9/12. Nutrition goal of care is comfort feeds. Will order Ensure Enlive BID. Please re-consult as needed.  Maureen Chatters, RD, LDN Pager #: [phone] After-Hours Pager #: [phone]

## 2017-01-05 LAB — GLUCOSE, CAPILLARY
GLUCOSE-CAPILLARY: 76 mg/dL (ref 65–99)
GLUCOSE-CAPILLARY: 81 mg/dL (ref 65–99)
GLUCOSE-CAPILLARY: 83 mg/dL (ref 65–99)
GLUCOSE-CAPILLARY: 84 mg/dL (ref 65–99)
GLUCOSE-CAPILLARY: 95 mg/dL (ref 65–99)
Glucose-Capillary: 72 mg/dL (ref 65–99)
Glucose-Capillary: 61 mg/dL — ABNORMAL LOW (ref 65–99)
Glucose-Capillary: 70 mg/dL (ref 65–99)

## 2017-01-05 MED ORDER — GLUCOSE 40 % PO GEL
ORAL | Status: AC
  Administered 2017-01-05: 37.5 g
  Filled 2017-01-05: qty 1

## 2017-01-05 NOTE — Consult Note (Signed)
WOC Nurse wound follow up Wound type:  Abdominal surgical wound with EC fistula at the distal edge of wound Unstageable pressure injury: sacrum;HAPI Unstageable pressure injury: left heel; HAPI Unstageable pressure injury: right ischium; HAPI  Measurement:  Abdominal wound: 12cm x 4cm x 2.0cm at distal end, did not probe deeply due to presence of the fistula; feculent material from distal edges of wound Sacrum: 5cm x 7cm x 0.1cm; 75% adherent grey eschar/25% pink; minimal serousanginous Right  ischium: 2cm x 1cm x 0.1cm; 80% pink/20% black; no drainage Left heel: 1.5cm x 1.0cm x 0.1cm; 100% yellow; no drainage  Wound bed: Abdominal: 90% yellow slough/10% pink with thick green/yellow effluent oozing from distal edge of the wound See other above Drainage (amount, consistency, odor) see above Periwound:intact Today I used surgical clippers to clip abdominal hair around wound for patient comfort with Eakin removal.  Dressing procedure/placement/frequency: 1. Prevalon boots and silicone foam to the left heel pressure injury; Prevalon boot to the right for vunerable heel 2. Clip abdominal hair weekly after Eakin pouch removal, Use pattern to cut new large Eakin pouch. Use Eakin barrier that you cut away from the center of the barrier to line distal edge of the wound with extra barrier 3. Place new large Eakin pouch, changing weekly inpatient.  May be able to change less often in outpatient setting 4. Continue enzymatic debridement to the sacral wound daily 5. Silicone foam to the right ischial wound 6. Add mattress when transfers out of IICU.   WOC Nurse team will follow along with you for weekly wound assessments and fistula management.  Please notify me of any acute changes in the wounds or any new areas of concerns [facility] MSN, RN,CWOCN, CNS, CWON-AP [phone]

## 2017-01-05 NOTE — Care Management Note (Addendum)
Case Management Note  Patient Details  Name: Shabaz Crouse MRN: [id number] Date of Birth: 1945-05-20  Subjective/Objective:    to SDU from 3E, patient with perforated pyloric ulcer, exploratoary lap, graham patch repair 8/10, fascial dehiscence, also Ltach was denied by insurance previously.  There is a palliative meeting tomorrow at 2 pm.  IR has been consulted for  Intra abd abscess drain placement.   9/13 1517 Letha Cape RN, BSN- Peforated gastric ulcer, peritonitis, intra abd abscess and colonic fistula , hypotension, palliative meeting yesterday- going to comfort care, drain removed, tube feeds dc'd, eating as tolerated.  Cont with iv abx to see if makes a difference in 48 hrs if not will decide on hospice options.   9/14 1531 Letha Cape RN, BSN - plan is for SNF per MD, CSW aware.  9/18 1210 Letha Cape RN, BSN - patient for dc today to SNF.  CSW unable to get patient SNF due to fistula with stool and Eiken Pouch.  Per CSW Dr. Phillips Odor contacted [facility] to see if they could have a RN go to a SNF to due dressing change, they could not dot this , Dr. Phillips Odor contacted [facility] rep , Lourey, to see if they could do LTACH, Dr. Phillips Odor spoke with family member regarding this, medical director aware.  Awaiting auth for [facility].              Action/Plan:   Expected Discharge Date:   (UNKNOWN)               Expected Discharge Plan:  Skilled Nursing Facility  In-House Referral:  Clinical Social Work  Discharge planning Services  CM Consult  Post Acute Care Choice:    Choice offered to:     DME Arranged:    DME Agency:     HH Arranged:    HH Agency:     Status of Service:  Completed, signed off  If discussed at [employer], dates discussed:    Additional Comments:  Leone Haven, RN 01/05/2017, 12:09 PM

## 2017-01-05 NOTE — Progress Notes (Signed)
PROGRESS NOTE   Jacob Pittman  IRW:[id number]    DOB: 09-07-1945    DOA: 11/26/2016  PCP: Biagio Borg, MD   I have briefly reviewed patients previous medical records in [facility].  Brief Narrative:  71 year old male with EF 15% (ischemic cardiomyopathy) status post AICD, HTN, DM, PAD, OSA, and overall poor health, admitted on 8/9 due to abdominal pain and found to have perforated pyloric channel ulcer and pneumoperitonitis. He underwent emergent laparotomy with omental patch repair, postoperatively remained in shock requiring epinephrine and levophed drips. Course complicated by acute ischemia of right arm requiring transfer from [facility] to [facility] and urgent OR embolectomy and also complicated by slow vent weaning. He was transferred from [facility] service to the hospitalist service on 12/12/16. General surgery continue to see.  Events:  8/12-  TTE LV moderately dilated with EF 15% &diffuse hypokinesis. There is akinesis of the inferolateral and inferior myocardium.  E coli UTI noted 8/13-  right brachial A- line placed and then developed ischemic right arm- vascular consult and transfer from [facility] to [facility]- thrombectomy of brachial artery- heparin Paroxysmal A-fib also noted 8/14- Decreased attenuation involving the right dentate nucleus in the right cerebellum and immediately adjacent cerebellar parenchyma, concerning for recent and potentially acute infarct in this area.  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- anticoagulation recommended to be stopped for 10-14 days - H pyloli + started on triple therapy - Transaminitis thought to be du to Diflucan hepatotoxicity 8/17 - febrile again and vasopressors resumed 8/23- extubated 8/25 A-fib with HR up to 160s, fever 101.6 at 4 AM- care transferred to [facility]  Assessment & Plan:   Principal Problem:   Perforated duodenal bulb ulcer ([place]) Active Problems:   Acute on chronic combined  systolic and diastolic CHF (congestive heart failure) ([place])   Nonischemic cardiomyopathy (HCC)   Pneumoperitoneum   Acute respiratory failure with hypoxia (HCC)   Paroxysmal A-fib ([place])   CVA (cerebral vascular accident) ([place])   Arterial thrombosis ([place])   H pylori ulcer   Fatty infiltration of liver   DM (diabetes mellitus), type 2 (HCC)   Pressure sore on heel, left, unstageable (HCC)   Poor venous access   Palliative care encounter   Goals of care, counseling/discussion   Bowel perforation ([place])   Sepsis ([place])   Coag negative Staphylococcus bacteremia   Perforated pyloric ulcer/H. pylori infection Complicated hospital course. Status post exploratory laparotomy, [place] patch repair 11/27/16 by Dr. Lucia Gaskins. Postoperatively developed abscess (16.7 x 5.6 x 7 cm), s/p IR drain 9/6 which was removed 9/12. Cultures growing multiple organisms and nontender dominant. Patient completed a course of triple antibiotic treatment for H. pylori infection  Intra-abdominal abscess As stated above, culture was polymicrobial. Patient has been on several IV antibiotics in the hospital. His last dose of IV antibiotics was daptomycin on 9/13 and Zosyn on 9/14. As per ID follow-up 9/17, ID had recommended additional 2 weeks course of oral Augmentin but I discussed with Dr. Johnnye Sima, ID today who recommended no further antibiotics and did not recommend outpatient follow-up with ID. Monitor.  Sigmoid colocutaneous fistula As per general surgery follow-up, fistula will be left open, continue Eakin's pouch as managed by wound care, oral diet as tolerated and no further surgery is planned at this time. Patient wishes to continue medical treatment but does not wish for any further heroics, if medical treatment does not make him better he will consider hospice.  MRSE bacteremia  Infectious disease was consulted. Central line out 12/24/16. TTE does not specifically mention endocarditis but given his overall  comorbidities, would not check TEE. One of 2 surveillance blood cultures from 9/7 showed MRSE. His repeat blood culture suggests (but does not confirm) endocarditis, infection of his ICD. Even though it is suspected that patient's AICD might be infected, removal of AICD will undertake a major surgery which patient may not tolerate and patient only wishes medical treatment and does not want major surgeries at this time. Repeat surveillance blood culture 9/14: Negative to date. ID recommends indefinite doxycycline 100 MG PO BID.  Septic shock Resolved.  Acute on chronic systolic and diastolic CHF: Repeat echo significant for improved EF of 25-30 percent from 15% with grade 2 diastolic dysfunction. Patient has significant peripheral/mostly upper extremity edema likely multifactorial from hypoalbuminemia/nutritional and CHF. May consider low-dose Lasix as BP and renal functions tolerate.  Paroxysmal atrial fibrillation [place] vasc 2 score  of at least 5. Continue Apixaban. Currently in sinus rhythm. Metoprolol discontinued due to hypotension.  Bibasilar atelectasis Incentive spirometry and follow chest x-ray periodically.  Hyponatremia Resolved  CVA Likely embolic from atrial fibrillation. Continue Eliquis. Aspirin discontinued 9/16.  Arterial thrombus right brachial artery Secondary to arterial line, provoked. Status post thrombectomy on 11/30/16. Currently on anticoagulation as indicated above.  Bilateral upper extremity edema No DVT on ultrasound. Diuresis as indicated above.  Fatty liver Supportive care.  Pacer firing Was read as asystole which did not appear to be the case. Was seen by cardiology for device interrogation.  Cognitive impairment Stable.  Type II DM A1c 5.8. Continue SSI.  Adult failure to thrive Multifactorial due to multiple severe significant comorbidities. Palliative care did see patient on 9/12. Paged to discuss.  Dysphagia Continue modified diet as per  speech therapy.  Acute urinary retention Noted on 9/15-9/16. Foley's catheter reinserted. May need to discharge on Foley catheter until outpatient evaluation by urology.  Hypokalemia:  Replaced. Magnesium normal.  Anemia May be multifactorial due to critical illness and chronic disease. Stable.  Acute respiratory failure with hypoxia Intubated and successfully extubated. Resolved.     DVT prophylaxis: Eliquis Code Status: DO NOT RESUSCITATE Family Communication: I discussed in detail with patient's daughter via phone. Updated care and answered questions. Disposition: DC to SNF possibly 9/19. Transfer from step down to medical bed on 9/18.   Consultants:  General surgery PCCM Interventional radiology Palliative care medicine Infectious disease  Cardiology Vascular surgery Neurology  Procedures:   8/10- ex lap- patch repair of pyloric ucler  8/13- brachial embolectomy  8/12- EF 15%, diffuse hypokinesis and akinesis of inferolateral/inferioir myocardium  9/6- Percutaneous drain  9/7- Echocardiogram: EF of 09-62%, grade 2 diastolic dysfunction. No evidence of vegetations  Antimicrobials:  Multiple since admission Currently on oral doxycycline   Subjective: Seen this morning along with wound care nurse who was doing dressing changes at bedside and patient's RN. Denied complaints. Denied pain. Tolerating diet. Having BM through fistula. No chest pain or dyspnea reported.   ROS: Denies  Objective:  Vitals:   01/04/17 1225 01/04/17 1615 01/05/17 0812 01/05/17 1214  BP: (!) 94/59 (!) _0  Pulse:   72 83  Resp:   (!) 23 (!) 26  Temp: (!) 97.4 F (36.3 C) 97.8 F (36.6 C) 98.6 F (37 C) 98.6 F (37 C)  TempSrc: Oral Oral Oral Oral  SpO2:   100% 100%  Weight:      Height:  Examination:  Gen. exam: Pleasant elderly male, moderately built and nourished, lying comfortably propped up in bed. Does not appear in any distress. Respiratory  system: Diminished breath sounds in the bases but no crackles. Rest of lung fields clear to auscultation. No increased work of breathing. Stable. Cardiovascular system: S1 and S2 heard, RRR. No JVD or murmurs. 1+ pitting bilateral leg edema and 2+ pitting bilateral upper extremity edema. Telemetry: Sinus rhythm with BBB morphology. Occasional episodes of 3-4 beat PVC/? NSVT. Abdominal exam: Eakin's pouch covering patient's abdominal wound draining liquidy yellow stools without bleeding. Removed the pouch and examined abdominal wound which looks clean without acute findings. Please see picture below taken on 9/18. Abdominal binder in place. Abdomen nondistended, soft and nontender. Normal bowel sounds heard. Central nervous system: Alert and oriented to person and place. No focal neurological deficits. Extremities: Significant extremity edema, per >lower. Psychiatry: Affect: Flat.        Data Reviewed: I have personally reviewed following labs and imaging studies  CBC:  Recent Labs Lab 12/30/16 0634 01/01/17 0831 01/02/17 0358 01/04/17 0400  WBC 5.7 4.2 3.9* 4.1  NEUTROABS  --   --  2.3  --   HGB 9.3* 10.2* 9.4* 9.3*  HCT 30.2* 31.6* 30.4* 30.0*  MCV 108.2* 105.3* 105.2* 104.9*  PLT 304 242 287 884   Basic Metabolic Panel:  Recent Labs Lab 12/30/16 0352 01/01/17 0831 01/03/17 0408 01/04/17 0400  NA 146* 137 140 142  K 4.4 3.6 3.1* 3.9  CL 118* 108 111 110  CO2 19* 21* 22 25  GLUCOSE 60* 76 79 81  BUN 24* _0 CREATININE 1.00 0.75 0.63 0.73  CALCIUM 8.0* 7.7* 8.0* 8.2*  MG  --   --  1.8 1.8   Liver Function Tests: No results for input(s): AST, ALT, ALKPHOS, BILITOT, PROT, ALBUMIN in the last 168 hours. Coagulation Profile: No results for input(s): INR, PROTIME in the last 168 hours. Cardiac Enzymes:  Recent Labs Lab 01/01/17 0831  CKTOTAL 49   HbA1C: No results for input(s): HGBA1C in the last 72 hours. CBG:  Recent Labs Lab 01/04/17 1223  01/04/17 1614 01/05/17 0001 01/05/17 0346 01/05/17 0808  GLUCAP 81 69 83 81 72    Recent Results (from the past 240 hour(s))  Culture, blood (Routine X 2) w Reflex to ID Panel     Status: None (Preliminary result)   Collection Time: 01/01/17 12:45 PM  Result Value Ref Range Status   Specimen Description BLOOD LEFT ARM  Final   Special Requests IN PEDIATRIC BOTTLE Blood Culture adequate volume  Final   Culture NO GROWTH 3 DAYS  Final   Report Status PENDING  Incomplete         Radiology Studies: No results found.      Scheduled Meds: . apixaban  5 mg Oral Q12H  . chlorhexidine gluconate (MEDLINE KIT)  15 mL Mouth Rinse BID  . collagenase   Topical Daily  . doxycycline  100 mg Oral Q12H  . feeding supplement (ENSURE ENLIVE)  237 mL Oral BID BM  . mouth rinse  15 mL Mouth Rinse QID  . midodrine  2.5 mg Oral TID WC  . pantoprazole  40 mg Oral BID  . sodium chloride flush  5 mL Intravenous Q8H   Continuous Infusions:   LOS: 6 days     HONGALGI,ANAND, MD, FACP, FHM. [facility] Pager [phone]  If 7PM-7AM, please contact night-coverage [email or web address] Password [email or web address] 01/05/2017, 1:02 PM

## 2017-01-05 NOTE — Progress Notes (Signed)
CSW working on placement for this patient- working along side palliative to try and obtain facility that can manage patient wound and give patient best chance at rehab  [place] [facility] [place] reviewing patient and palliative exploring if eakin pouch can be managed by [facility] since this has been the biggest barrier to DC  CSW will continue to follow- [place] to give determination tomorrow  Burna Sis, LCSW Clinical Social Worker [phone]

## 2017-01-05 NOTE — Progress Notes (Signed)
Physical Therapy Treatment Patient Details Name: Jacob Pittman MRN: [id number] DOB: [birth year] Today's Date: 01/05/2017    History of Present Illness Patient is a 71 yo male admitted 11/26/16 with perforated duodenal bulb ulcer, now s/p exploratory lap and repair on 11/27/16. Postoperatively remained in shock requiring epinephrine and Levophed drip. Also complicated by ischemic RUE and is s/p embolectomy.  Patient was slow to wean from vent, extubated on 12/10/16.  Patient with confusion.     PMH:   ICM, DM, HTN, PAD, OSA, CHF, EF 15%, ICD, PAF, OA    PT Comments    Pt making good progress. Motivated to work toward more independence.   Follow Up Recommendations  SNF;Supervision/Assistance - 24 hour     Equipment Recommendations  Other (comment) (TBA)    Recommendations for Other Services       Precautions / Restrictions Precautions Precautions: Fall Precaution Comments: NG tube; feeding tube; Jp drain Restrictions Weight Bearing Restrictions: No    Mobility  Bed Mobility Overal bed mobility: Needs Assistance Bed Mobility: Supine to Sit     Supine to sit: +2 for physical assistance;Mod assist     General bed mobility comments: Assist to bring legs off bed, elevate trunk into sitting, and bring hips to EOB  Transfers Overall transfer level: Needs assistance Equipment used: Ambulation equipment used Transfers: Sit to/from [employer] Transfers Sit to Stand: +2 physical assistance;Mod assist Stand pivot transfers:  (Used Stedy)       General transfer comment: Assist to bring hips and trunk up. On intial stand with stedy pt with some flexion of hips and trunk. On second stand pt able to stand more erect.  Ambulation/Gait             General Gait Details: Unable   Information systems manager [place] (Stroke Patients Only)       Balance Overall balance assessment: Needs assistance Sitting-balance support: Bilateral upper  extremity supported;Feet supported Sitting balance-Leahy Scale: Poor Sitting balance - Comments: UE assist Postural control: Posterior lean Standing balance support: Bilateral upper extremity supported Standing balance-Leahy Scale: Poor Standing balance comment: Stood with stedy x 2 for 10-20 sec with 2 mod assist.                            Cognition Arousal/Alertness: Awake/alert Behavior During Therapy: WFL for tasks assessed/performed Overall Cognitive Status: No family/caregiver present to determine baseline cognitive functioning                                        Exercises      General Comments        Pertinent Vitals/Pain Pain Assessment: Faces Faces Pain Scale: Hurts little more Pain Location: abdomen with movement Pain Descriptors / Indicators: Grimacing;Guarding Pain Intervention(s): Monitored during session;Repositioned    Home Living                      Prior Function            PT Goals (current goals can now be found in the care plan section) Progress towards PT goals: Progressing toward goals    Frequency    Min 2X/week      PT Plan Current plan remains appropriate    Co-evaluation  AM-PAC PT "6 Clicks" Daily Activity  Outcome Measure  Difficulty turning over in bed (including adjusting bedclothes, sheets and blankets)?: Unable Difficulty moving from lying on back to sitting on the side of the bed? : Unable Difficulty sitting down on and standing up from a chair with arms (e.g., wheelchair, bedside commode, etc,.)?: Unable Help needed moving to and from a bed to chair (including a wheelchair)?: Total Help needed walking in hospital room?: Total Help needed climbing 3-5 steps with a railing? : Total 6 Click Score: 6    End of Session Equipment Utilized During Treatment: Oxygen Activity Tolerance: Patient tolerated treatment well Patient left: with call bell/phone within reach;in  chair Nurse Communication: Mobility status;Need for lift equipment PT Visit Diagnosis: Muscle weakness (generalized) (M62.81);Difficulty in walking, not elsewhere classified (R26.2)     Time: 7121-9758 PT Time Calculation (min) (ACUTE ONLY): 19 min  Charges:  $Therapeutic Activity: 8-22 mins                    G Codes:       [facility] PT [phone]    Angelina Ok [facility] 01/05/2017, 1:00 PM

## 2017-01-05 NOTE — Progress Notes (Signed)
Patient ID: Jacob Pittman, male   DOB: 1945-11-27, 71 y.o.   MRN: [id number]  [facility] Surgery Progress Note  36 Days Post-Op  Subjective: CC- Colocutaneous fistula Resting comfortably. Denies any pain. Tolerating diet well. Denies n/v. Eakins pouch changed yesterday.  Objective: Vital signs in last 24 hours: Temp:  [97.3 F (36.3 C)-97.8 F (36.6 C)] 97.8 F (36.6 C) (09/17 1615) BP: (88-94)/(59-73) 88/60 (09/17 1615) Last BM Date: 01/04/17  Intake/Output from previous day: 09/17 0701 - 09/18 0700 In: 790 [P.O.:780; I.V.:10] Out: 75 [Stool:75] Intake/Output this shift: No intake/output data recorded.  PE: Gen: Alert, NAD, cooperative, pleasant HEENT: EOM's intact, pupils equal and round Pulm: effort normal Abd: Soft, nondistended, +BS, open midline incision with Eakins pouch in place/good seal and small amount liquid brown stool and air in bag, no abdominal tenderness Ext: edema noted to BUE  Lab Results:   Recent Labs  01/04/17 0400  WBC 4.1  HGB 9.3*  HCT 30.0*  PLT 284   BMET  Recent Labs  01/03/17 0408 01/04/17 0400  NA 140 142  K 3.1* 3.9  CL 111 110  CO2 22 25  GLUCOSE 79 81  BUN 9 11  CREATININE 0.63 0.73  CALCIUM 8.0* 8.2*   PT/INR No results for input(s): LABPROT, INR in the last 72 hours. CMP     Component Value Date/Time   NA 142 01/04/2017 0400   K 3.9 01/04/2017 0400   CL 110 01/04/2017 0400   CO2 25 01/04/2017 0400   GLUCOSE 81 01/04/2017 0400   BUN 11 01/04/2017 0400   CREATININE 0.73 01/04/2017 0400   CREATININE 0.91 03/04/2015 1618   CALCIUM 8.2 (L) 01/04/2017 0400   PROT 5.7 (L) 12/28/2016 0534   ALBUMIN 2.0 (L) 12/28/2016 0534   AST 39 12/28/2016 0534   ALT 49 12/28/2016 0534   ALKPHOS 92 12/28/2016 0534   BILITOT 0.6 12/28/2016 0534   GFRNONAA >60 01/04/2017 0400   GFRAA >60 01/04/2017 0400   Lipase     Component Value Date/Time   LIPASE 20 12/01/2016 0304       Studies/Results: No results  found.  Anti-infectives: Anti-infectives    Start     Dose/Rate Route Frequency Ordered Stop   01/01/17 1200  doxycycline (VIBRA-TABS) tablet 100 mg     100 mg Oral Every 12 hours 01/01/17 1157     12/30/16 1230  fluconazole (DIFLUCAN) IVPB 200 mg  Status:  Discontinued     200 mg 100 mL/hr over 60 Minutes Intravenous Every 24 hours 12/30/16 1127 01/01/17 1203   12/25/16 1600  DAPTOmycin (CUBICIN) 500 mg in sodium chloride 0.9 % IVPB  Status:  Discontinued     500 mg 220 mL/hr over 30 Minutes Intravenous Every 24 hours 12/25/16 1002 01/01/17 1157   12/25/16 1300  DAPTOmycin (CUBICIN) 500 mg in sodium chloride 0.9 % IVPB  Status:  Discontinued     500 mg 220 mL/hr over 30 Minutes Intravenous Every 24 hours 12/25/16 0956 12/25/16 1002   12/23/16 1800  vancomycin (VANCOCIN) IVPB 750 mg/150 ml premix  Status:  Discontinued     750 mg 150 mL/hr over 60 Minutes Intravenous Every 12 hours 12/23/16 0310 12/25/16 0941   12/23/16 0145  vancomycin (VANCOCIN) 1,750 mg in sodium chloride 0.9 % 500 mL IVPB     1,750 mg 250 mL/hr over 120 Minutes Intravenous  Once 12/23/16 0138 12/23/16 0353   12/21/16 1500  piperacillin-tazobactam (ZOSYN) IVPB 3.375 g  Status:  Discontinued  3.375 g 12.5 mL/hr over 240 Minutes Intravenous Every 8 hours 12/21/16 1456 01/01/17 1203   12/13/16 1000  amoxicillin (AMOXIL) 250 MG/5ML suspension 1,000 mg     1,000 mg Per Tube Every 12 hours 12/13/16 0935 12/16/16 1833   12/12/16 1900  piperacillin-tazobactam (ZOSYN) IVPB 3.375 g  Status:  Discontinued     3.375 g 12.5 mL/hr over 240 Minutes Intravenous Every 8 hours 12/12/16 1759 12/13/16 0911   12/12/16 1400  Ampicillin-Sulbactam (UNASYN) 3 g in sodium chloride 0.9 % 100 mL IVPB  Status:  Discontinued     3 g 200 mL/hr over 30 Minutes Intravenous Every 6 hours 12/12/16 1215 12/12/16 1753   12/10/16 0600  amoxicillin (AMOXIL) 250 MG/5ML suspension 1,000 mg  Status:  Discontinued     1,000 mg Per Tube Every 12 hours  12/09/16 1701 12/12/16 1206   12/09/16 1130  amoxicillin (AMOXIL) 250 MG/5ML suspension 1,000 mg  Status:  Discontinued     1,000 mg Oral Every 12 hours 12/09/16 1117 12/09/16 1118   12/09/16 1130  amoxicillin (AMOXIL) 250 MG/5ML suspension 1,000 mg  Status:  Discontinued     1,000 mg Per Tube Every 12 hours 12/09/16 1118 12/09/16 1701   12/04/16 2200  clarithromycin (BIAXIN) tablet 500 mg     500 mg Per Tube Every 12 hours 12/04/16 1107 12/16/16 2158   12/04/16 2000  piperacillin-tazobactam (ZOSYN) IVPB 3.375 g     3.375 g 12.5 mL/hr over 240 Minutes Intravenous Every 8 hours 12/04/16 1339 12/08/16 2359   12/04/16 1600  metroNIDAZOLE (FLAGYL) tablet 500 mg  Status:  Discontinued     500 mg Per Tube Every 8 hours 12/04/16 1107 12/09/16 1117   12/02/16 1100  clarithromycin (BIAXIN) 250 MG/5ML suspension 500 mg  Status:  Discontinued     500 mg Per Tube Every 12 hours 12/02/16 1012 12/04/16 1107   12/02/16 1045  metroNIDAZOLE (FLAGYL) 50 mg/ml oral suspension 500 mg  Status:  Discontinued     500 mg Per Tube 3 times daily 12/02/16 1036 12/04/16 1107   12/02/16 1015  metroNIDAZOLE (FLAGYL) 50 mg/ml oral suspension 250 mg  Status:  Discontinued     250 mg Per Tube 4 times daily 12/02/16 1011 12/02/16 1036   11/28/16 0800  fluconazole (DIFLUCAN) IVPB 200 mg  Status:  Discontinued     200 mg 100 mL/hr over 60 Minutes Intravenous Every 24 hours 11/27/16 0624 12/02/16 1018   11/27/16 0630  fluconazole (DIFLUCAN) IVPB 400 mg     400 mg 100 mL/hr over 120 Minutes Intravenous  Once 11/27/16 0622 11/27/16 0841   11/27/16 0600  piperacillin-tazobactam (ZOSYN) IVPB 3.375 g  Status:  Discontinued     3.375 g 12.5 mL/hr over 240 Minutes Intravenous Every 8 hours 11/27/16 0534 12/04/16 1339   11/27/16 0515  piperacillin-tazobactam (ZOSYN) IVPB 3.375 g  Status:  Discontinued     3.375 g 100 mL/hr over 30 Minutes Intravenous  Once 11/27/16 0510 11/27/16 0518   11/27/16 0030  piperacillin-tazobactam  (ZOSYN) IVPB 3.375 g     3.375 g 100 mL/hr over 30 Minutes Intravenous  Once 11/27/16 0016 11/27/16 0755   11/27/16 0030  vancomycin (VANCOCIN) IVPB 1000 mg/200 mL premix     1,000 mg 200 mL/hr over 60 Minutes Intravenous  Once 11/27/16 0016 11/27/16 0756       Assessment/Plan Perforated pyloric ulcer  S/P EXPLORATORY LAPAROTOMY, GRAHAM PATCH REPAIR8/10/18 Dr. Ovidio Kin 16.7 x 5.6 x 7.0 cm abscess - s/p  IR drain 9/6, removed 9/12 - culture growing multiple organisms none predominant Sigmoid colocutaneous fistula - Eakins pouch in place  H Pylori Positive Shock - resolved Acute encephalopathy Biventricular heart failure EF 15%/AICD PAF Right hand ischemia - right brachial/ulnar/radial artery embolectomies, 11/30/16, Dr. Darrick Penna CVA - on Eliquis Malnutrition - prealbumin 8.2 (9/7) Hypernatremia/hyperchloremia - resolved Dysphagia DM Acute renal failure - improved Code status DNR  FEN: dyphagia 3 diet, Ensure Enlive BID ID:  [email or web address] 8/9- 12/08/16, Diflucan 8/9-8/15/18, clarithromycin 8/17 - 8/29,Amoxicillin 8/23-29/18  Vancomycin restarted 9/5>>9/7,Zosyn restarted 9/3>>9/14,Daptomycin 9/7>>9/14, Diflucan restarted 9/12>>9/14, Doxycycline 9/14>>day#5(indefinite for possible endocarditis/infxn of his ICD) DVT: Eliquis  Plan: Continue Eakins pouch. Continue dysphagia 3 diet, working with SLP.  Ready for discharge to SNF from surgical perspective.   LOS: 39 days    Franne Forts , [facility] 01/05/2017, 7:36 AM Pager: [phone] Consults: [phone] Mon-Fri 7:00 am-4:30 pm Sat-Sun 7:00 am-11:30 am

## 2017-01-06 ENCOUNTER — Telehealth: Payer: Self-pay | Admitting: Cardiology

## 2017-01-06 DIAGNOSIS — R600 Localized edema: Secondary | ICD-10-CM

## 2017-01-06 LAB — CULTURE, BLOOD (ROUTINE X 2)
Culture: NO GROWTH
Special Requests: ADEQUATE

## 2017-01-06 LAB — GLUCOSE, CAPILLARY
GLUCOSE-CAPILLARY: 73 mg/dL (ref 65–99)
GLUCOSE-CAPILLARY: 98 mg/dL (ref 65–99)
GLUCOSE-CAPILLARY: 87 mg/dL (ref 65–99)
GLUCOSE-CAPILLARY: 91 mg/dL (ref 65–99)
Glucose-Capillary: 65 mg/dL (ref 65–99)

## 2017-01-06 MED ORDER — FUROSEMIDE 20 MG PO TABS
10.0000 mg | ORAL_TABLET | Freq: Once | ORAL | Status: AC
  Administered 2017-01-06: 10 mg via ORAL
  Filled 2017-01-06: qty 1

## 2017-01-06 NOTE — Progress Notes (Signed)
Pt transferred to 6C12.  All pt belongings taken with pt. VS stable. Pts. Daughter Vikki Ports notified by phone of pt transfer and informed of new room number.

## 2017-01-06 NOTE — NC FL2 (Signed)
[place] LEVEL OF CARE SCREENING TOOL     IDENTIFICATION  Patient Name: Jacob Pittman Birthdate: [birth year] Sex: male Admission Date (Current Location): 11/26/2016  [facility] and [place] Number:  Herbalist and Address:  [facility] [place] [facility] [place] [address], [place], [place] [address]      Provider Number: [id number]  Attending Physician Name and Address:  Modena Jansky, MD  Relative Name and Phone Number:       Current Level of Care: Hospital Recommended Level of Care: [place] Prior Approval Number:    Date Approved/Denied:   PASRR Number: [id number]  Discharge Plan: SNF    Current Diagnoses: Patient Active Problem List   Diagnosis Date Noted  . Coag negative Staphylococcus bacteremia   . Bowel perforation ([place])   . Sepsis ([place])   . Palliative care encounter   . Goals of care, counseling/discussion   . Poor venous access   . Pressure sore on heel, left, unstageable ([place]) 12/16/2016  . Paroxysmal A-fib ([place]) 12/12/2016  . CVA (cerebral vascular accident) ([place]) 12/12/2016  . 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 H pylori ulcer 12/12/2016  . Fatty infiltration of liver 12/12/2016  . DM (diabetes mellitus), type 2 ([place]) 12/12/2016  . Acute respiratory failure with hypoxia ([place])   . Pneumoperitoneum 11/27/2016  . Perforated duodenal bulb ulcer ([place]) 11/27/2016  . Elevated PSA 02/07/2016  . Contact dermatitis 07/23/2015  . History of colonic polyps 07/15/2015  . Venous stasis ulcer of left lower extremity ([place]) 01/21/2015  . Left arm swelling 01/03/2015  . Dyspnea 01/03/2015  . Irregular heart beats 01/03/2015  . Wheezing 07/19/2014  . Peripheral arterial disease ([place]) 10/31/2013  . Conjunctivitis 09/22/2013  . Allergic rhinitis 09/22/2013  . ICD (implantable cardioverter-defibrillator), single, in situ 07/12/2013  . Chronic combined systolic and diastolic heart failure, NYHA class 3 ([place]) 04/10/2013   . Nonischemic cardiomyopathy ([place])   . Acute on chronic combined systolic and diastolic CHF (congestive heart failure) ([place]) 09/30/2012  . History of alcohol use 09/30/2012  . Back pain 10/08/2011  . 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 Preventative health care 08/17/2010  . HIP PAIN, RIGHT 11/06/2009  . ANXIETY 02/04/2009  . Diabetes ([place]) 11/07/2008  . Hyperlipidemia 11/07/2008  . 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 Essential hypertension 10/08/2008  . OSTEOARTHRITIS, KNEE, RIGHT 10/08/2008  . FATIGUE 10/08/2008    Orientation RESPIRATION BLADDER Height & Weight     Self, Place, Situation  Normal External catheter Weight: 186 lb 8.2 oz (84.6 kg) Height:  _0  (185.4 cm)  BEHAVIORAL SYMPTOMS/MOOD NEUROLOGICAL BOWEL NUTRITION STATUS      Continent Diet (see DC summary)  AMBULATORY STATUS COMMUNICATION OF NEEDS Skin   Extensive Assist Verbally Surgical wounds (Wound type: surgical wound with EC fistula at the distal end;Drainage (amount, consistency, odor) green effluent with stool odor at distal end, fistula stomatized - eakin pouch)- patient will be sent with stencil for the eakin pouch- size large Unstageable Pressure ulcer- located on coccyx- foam dressing change daily Unstageable pressure ulcer located on ischial tuberosity requiring foam dressing changes                       Personal Care Assistance Level of Assistance  Bathing, Dressing Bathing Assistance: Maximum assistance Feeding assistance: Limited assistance Dressing Assistance: Maximum assistance     Functional Limitations Info  Sight, Hearing, Speech Sight Info: Adequate Hearing Info: Adequate Speech Info: Adequate  SPECIAL CARE FACTORS FREQUENCY  PT (By licensed PT), OT (By licensed OT)     PT Frequency: 3/wk OT Frequency: 3/wk            Contractures Contractures Info: Not present    Additional Factors Info  Code Status, Allergies Code Status Info: DNR Allergies Info: NKA            Current Medications (01/06/2017):  This is the current hospital active medication list Current Facility-Administered Medications  Medication Dose Route Frequency Provider Last Rate Last Dose  . acetaminophen (TYLENOL) solution 650 mg  650 mg Oral Q4H PRN Florencia Reasons, MD   650 mg at 01/02/17 1329  . apixaban (ELIQUIS) tablet 5 mg  5 mg Oral Q12H Blount, Xenia T, NP   5 mg at 01/06/17 0745  . artificial tears (LACRILUBE) ophthalmic ointment   Both Eyes Q3H PRN Hammonds, Sharyn Blitz, MD      . chlorhexidine gluconate (MEDLINE KIT) (PERIDEX) 0.12 % solution 15 mL  15 mL Mouth Rinse BID Mannam, Praveen, MD   15 mL at 01/06/17 0752  . collagenase (SANTYL) ointment   Topical Daily Thurnell Lose, MD      . doxycycline (VIBRA-TABS) tablet 100 mg  100 mg Oral Q12H Thurnell Lose, MD   100 mg at 01/06/17 0933  . feeding supplement (ENSURE ENLIVE) (ENSURE ENLIVE) liquid 237 mL  237 mL Oral BID BM Modena Jansky, MD   237 mL at 01/06/17 0933  . fentaNYL (SUBLIMAZE) injection 12.5-25 mcg  12.5-25 mcg Intravenous Q1H PRN Meuth, Brooke A, PA-C      . MEDLINE mouth rinse  15 mL Mouth Rinse QID Mannam, Praveen, MD   15 mL at 01/05/17 1624  . midodrine (PROAMATINE) tablet 2.5 mg  2.5 mg Oral TID WC Mariel Aloe, MD   2.5 mg at 01/06/17 0746  . ondansetron (ZOFRAN-ODT) disintegrating tablet 4 mg  4 mg Oral Q6H PRN Alphonsa Overall, MD       Or  . ondansetron [facility]) injection 4 mg  4 mg Intravenous Q6H PRN Alphonsa Overall, MD      . pantoprazole (PROTONIX) EC tablet 40 mg  40 mg Oral BID Thurnell Lose, MD   40 mg at 01/06/17 0933  . sodium chloride flush (NS) 0.9 % injection 5 mL  5 mL Intravenous Q8H Jacqulynn Cadet, MD   5 mL at 01/05/17 0503     Discharge Medications: Please see discharge summary for a list of discharge medications.  Relevant Imaging Results:  Relevant Lab Results:   Additional Information SSN: [id number]  Jorge Ny, [place]

## 2017-01-06 NOTE — Progress Notes (Addendum)
Inpatient Diabetes Program Recommendations  AACE/ADA: New Consensus Statement on Inpatient Glycemic Control (2015)  Target Ranges:  Prepandial:   less than 140 mg/dL      Peak postprandial:   less than 180 mg/dL (1-2 hours)      Critically ill patients:  140 - 180 mg/dL   Results for ALESANDER, YADAV (MRN [id number]) as of 01/06/2017 12:47  Ref. Range 01/05/2017 08:08 01/05/2017 16:14 01/05/2017 16:47 01/05/2017 18:27 01/05/2017 19:25 01/05/2017 23:02 01/06/2017 03:10 01/06/2017 07:46  Glucose-Capillary Latest Ref Range: 65 - 99 mg/dL 72 61 (L) 70 76 95 84 65 73  Results for GURVINDER, AGOSTO (MRN [id number]) as of 01/06/2017 12:47  Ref. Range 11/27/2016 07:40  Hemoglobin A1C Latest Ref Range: 4.8 - 5.6 % 5.8 (H)   Review of Glycemic Control  Diabetes history: DM2 Outpatient Diabetes medications: Metformin XR 1000 mg QAM Current orders for Inpatient glycemic control: CBG monitoring; no DM meds inpatient  Inpatient Diabetes Program Recommendations: HgbA1C: A1C 5.8% on 11/27/16 indicating an average glucose of 120 mg/dl over the past 2-3 months.  Outpatient DM medications: May want to consider re-evaluating if outpatient DM medications needed at time of discharge.  NOTE:  Glucose has ranged form 60-95 mg/dl over the past 5 days without any DM medications and A1C 5.8% on 11/27/16 indicating very good DM control.  Thanks, Orlando Penner, RN, MSN, CDE Diabetes Coordinator Inpatient Diabetes Program [phone] (Team Pager from 8am to 5pm)

## 2017-01-06 NOTE — Consult Note (Addendum)
   [facility] Inpatient Consult   01/06/2017  Jacob Pittman 1945/04/24 [id number]    Received call from Dr.Golding to inquire whether [facility] Care Management can assist with management of Jacob Pittman. Discussed that [facility] Care Management case managers do not operate as home health nurses for wound care and skilled nursing care.   Discussed that writer will make Jacob Pittman Care Management leadership aware of Dr Beatrice Lecher' concerns and request. Will follow up with Dr. Phillips Odor.   Jacob Noble, MSN-Ed, RN,BSN [facility] Liaison [phone]

## 2017-01-06 NOTE — Progress Notes (Signed)
Patient ID: Jacob Pittman, male   DOB: Sep 06, 1945, 71 y.o.   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 Family/social work looking for SNF capable of Retail banker. Patient has no complaints today. Denies n/v. Tolerating diet. Denies abdominal pain.  Objective: Vital signs in last 24 hours: Temp:  [97.3 F (36.3 C)-98.6 F (37 C)] 98.3 F (36.8 C) (09/19 0741) Pulse Rate:  [42-83] 77 (09/19 0741) Resp:  [23-31] 28 (09/19 0741) BP: (95-105)/(48-71) 98/56 (09/19 0741) SpO2:  [84 %-100 %] 84 % (09/19 0741) Last BM Date: 01/05/17  Intake/Output from previous day: 09/18 0701 - 09/19 0700 In: 1040 [P.O.:1040] Out: 600 [Urine:600] Intake/Output this shift: Total I/O In: 240 [P.O.:240] Out: -   PE: Gen: Alert, NAD, cooperative, pleasant HEENT: EOM's intact, pupils equal and round Pulm: effort normal Abd: Soft, nondistended, +BS, open midline incision with Eakins pouch in place/good [place] small amount liquid brown stool and air in bag, no abdominal tenderness Ext: edema noted to BUE   Lab Results:   Recent Labs  01/04/17 0400  WBC 4.1  HGB 9.3*  HCT 30.0*  PLT 284   BMET  Recent Labs  01/04/17 0400  NA 142  K 3.9  CL 110  CO2 25  GLUCOSE 81  BUN 11  CREATININE 0.73  CALCIUM 8.2*   PT/INR No results for input(s): LABPROT, INR in the last 72 hours. CMP     Component Value Date/Time   NA 142 01/04/2017 0400   K 3.9 01/04/2017 0400   CL 110 01/04/2017 0400   CO2 25 01/04/2017 0400   GLUCOSE 81 01/04/2017 0400   BUN 11 01/04/2017 0400   CREATININE 0.73 01/04/2017 0400   CREATININE 0.91 03/04/2015 1618   CALCIUM 8.2 (L) 01/04/2017 0400   PROT 5.7 (L) 12/28/2016 0534   ALBUMIN 2.0 (L) 12/28/2016 0534   AST 39 12/28/2016 0534   ALT 49 12/28/2016 0534   ALKPHOS 92 12/28/2016 0534   BILITOT 0.6 12/28/2016 0534   GFRNONAA >60 01/04/2017 0400   GFRAA >60  01/04/2017 0400   Lipase     Component Value Date/Time   LIPASE 20 12/01/2016 0304       Studies/Results: No results found.  Anti-infectives: Anti-infectives    Start     Dose/Rate Route Frequency Ordered Stop   01/01/17 1200  doxycycline (VIBRA-TABS) tablet 100 mg     100 mg Oral Every 12 hours 01/01/17 1157     12/30/16 1230  fluconazole (DIFLUCAN) IVPB 200 mg  Status:  Discontinued     200 mg 100 mL/hr over 60 Minutes Intravenous Every 24 hours 12/30/16 1127 01/01/17 1203   12/25/16 1600  DAPTOmycin (CUBICIN) 500 mg in sodium chloride 0.9 % IVPB  Status:  Discontinued     500 mg 220 mL/hr over 30 Minutes Intravenous Every 24 hours 12/25/16 1002 01/01/17 1157   12/25/16 1300  DAPTOmycin (CUBICIN) 500 mg in sodium chloride 0.9 % IVPB  Status:  Discontinued     500 mg 220 mL/hr over 30 Minutes Intravenous Every 24 hours 12/25/16 0956 12/25/16 1002   12/23/16 1800  vancomycin (VANCOCIN) IVPB 750 mg/150 ml premix  Status:  Discontinued     750 mg 150 mL/hr over 60 Minutes Intravenous Every 12 hours 12/23/16 0310 12/25/16 0941   12/23/16 0145  vancomycin (VANCOCIN) 1,750 mg in sodium chloride 0.9 % 500 mL IVPB     1,750 mg 250  mL/hr over 120 Minutes Intravenous  Once 12/23/16 0138 12/23/16 0353   12/21/16 1500  piperacillin-tazobactam (ZOSYN) IVPB 3.375 g  Status:  Discontinued     3.375 g 12.5 mL/hr over 240 Minutes Intravenous Every 8 hours 12/21/16 1456 01/01/17 1203   12/13/16 1000  amoxicillin (AMOXIL) 250 MG/5ML suspension 1,000 mg     1,000 mg Per Tube Every 12 hours 12/13/16 0935 12/16/16 1833   12/12/16 1900  piperacillin-tazobactam (ZOSYN) IVPB 3.375 g  Status:  Discontinued     3.375 g 12.5 mL/hr over 240 Minutes Intravenous Every 8 hours 12/12/16 1759 12/13/16 0911   12/12/16 1400  Ampicillin-Sulbactam (UNASYN) 3 g in sodium chloride 0.9 % 100 mL IVPB  Status:  Discontinued     3 g 200 mL/hr over 30 Minutes Intravenous Every 6 hours 12/12/16 1215 12/12/16 1753    12/10/16 0600  amoxicillin (AMOXIL) 250 MG/5ML suspension 1,000 mg  Status:  Discontinued     1,000 mg Per Tube Every 12 hours 12/09/16 1701 12/12/16 1206   12/09/16 1130  amoxicillin (AMOXIL) 250 MG/5ML suspension 1,000 mg  Status:  Discontinued     1,000 mg Oral Every 12 hours 12/09/16 1117 12/09/16 1118   12/09/16 1130  amoxicillin (AMOXIL) 250 MG/5ML suspension 1,000 mg  Status:  Discontinued     1,000 mg Per Tube Every 12 hours 12/09/16 1118 12/09/16 1701   12/04/16 2200  clarithromycin (BIAXIN) tablet 500 mg     500 mg Per Tube Every 12 hours 12/04/16 1107 12/16/16 2158   12/04/16 2000  piperacillin-tazobactam (ZOSYN) IVPB 3.375 g     3.375 g 12.5 mL/hr over 240 Minutes Intravenous Every 8 hours 12/04/16 1339 12/08/16 2359   12/04/16 1600  metroNIDAZOLE (FLAGYL) tablet 500 mg  Status:  Discontinued     500 mg Per Tube Every 8 hours 12/04/16 1107 12/09/16 1117   12/02/16 1100  clarithromycin (BIAXIN) 250 MG/5ML suspension 500 mg  Status:  Discontinued     500 mg Per Tube Every 12 hours 12/02/16 1012 12/04/16 1107   12/02/16 1045  metroNIDAZOLE (FLAGYL) 50 mg/ml oral suspension 500 mg  Status:  Discontinued     500 mg Per Tube 3 times daily 12/02/16 1036 12/04/16 1107   12/02/16 1015  metroNIDAZOLE (FLAGYL) 50 mg/ml oral suspension 250 mg  Status:  Discontinued     250 mg Per Tube 4 times daily 12/02/16 1011 12/02/16 1036   11/28/16 0800  fluconazole (DIFLUCAN) IVPB 200 mg  Status:  Discontinued     200 mg 100 mL/hr over 60 Minutes Intravenous Every 24 hours 11/27/16 0624 12/02/16 1018   11/27/16 0630  fluconazole (DIFLUCAN) IVPB 400 mg     400 mg 100 mL/hr over 120 Minutes Intravenous  Once 11/27/16 0622 11/27/16 0841   11/27/16 0600  piperacillin-tazobactam (ZOSYN) IVPB 3.375 g  Status:  Discontinued     3.375 g 12.5 mL/hr over 240 Minutes Intravenous Every 8 hours 11/27/16 0534 12/04/16 1339   11/27/16 0515  piperacillin-tazobactam (ZOSYN) IVPB 3.375 g  Status:  Discontinued      3.375 g 100 mL/hr over 30 Minutes Intravenous  Once 11/27/16 0510 11/27/16 0518   11/27/16 0030  piperacillin-tazobactam (ZOSYN) IVPB 3.375 g     3.375 g 100 mL/hr over 30 Minutes Intravenous  Once 11/27/16 0016 11/27/16 0755   11/27/16 0030  vancomycin (VANCOCIN) IVPB 1000 mg/200 mL premix     1,000 mg 200 mL/hr over 60 Minutes Intravenous  Once 11/27/16 0016 11/27/16 0756  Assessment/Plan Perforated pyloric ulcer  S/P EXPLORATORY LAPAROTOMY, GRAHAM PATCH REPAIR8/10/18 Dr. 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 Darrick Penna CVA - on Eliquis Malnutrition - prealbumin 8.2 (9/7) Hypernatremia/hyperchloremia - resolved Dysphagia DM Acute renal failure - improved Code status DNR  FEN: dyphagia 3 diet, Ensure Enlive BID ID:  zosyn 8/9- 12/08/16, Diflucan 8/9-8/15/18, clarithromycin 8/17 - 8/29,Amoxicillin 8/23-29/18  Vancomycin restarted 9/5>>9/7,Zosyn restarted 9/3>>9/14,Daptomycin 9/7>>9/14, Diflucan restarted 9/12>>9/14, Doxycycline 9/14>>day#6(indefinite for possible endocarditis/infxn of his ICD) DVT: Eliquis  Plan: Continue Eakins pouch. Continue dysphagia 3 die.  Ready for discharge to SNF from surgical perspective. Patient will follow up with Dr. Ezzard Standing in 2 weeks.   LOS: 40 days    Franne Forts , [facility] Surgery 01/06/2017, 9:11 AM Pager: [phone] Consults: [phone] Mon-Fri 7:00 am-4:30 pm Sat-Sun 7:00 am-11:30 am

## 2017-01-06 NOTE — Progress Notes (Signed)
Spoke at length with his daughter Corrie Dandy are upset that there is not a facility locally that can handle his level of care. I am going to contact [facility] CM and see if we can work something out with a preferred facility. Main issue is nursing care and management of Eakins Pouch.  Anderson Malta, DO Palliative Medicine

## 2017-01-06 NOTE — Progress Notes (Signed)
PROGRESS NOTE   Jacob Pittman  CBJ:[id number]    DOB: [birth year]    DOA: 11/26/2016  PCP: Biagio Borg, MD   I have briefly reviewed patients previous medical records in [facility].  Brief Narrative:  71 year old male with EF 15% (ischemic cardiomyopathy) status post AICD, HTN, DM, PAD, OSA, and overall poor health, admitted on 8/9 due to abdominal pain and found to have perforated pyloric channel ulcer and pneumoperitonitis. He underwent emergent laparotomy with omental patch repair, postoperatively remained in shock requiring epinephrine and levophed drips. Course complicated by acute ischemia of right arm requiring transfer from [facility] to [facility] and urgent OR embolectomy and also complicated by slow vent weaning. He was transferred from [facility] service to the hospitalist service on 12/12/16. General surgery continue to see.  Events:  8/12-  TTE LV moderately dilated with EF 15% &diffuse hypokinesis. There is akinesis of the inferolateral and inferior myocardium.  E coli UTI noted 8/13-  right brachial A- line placed and then developed ischemic right arm- vascular consult and transfer from [facility] to [facility]- thrombectomy of brachial artery- heparin Paroxysmal A-fib also noted 8/14- Decreased attenuation involving the right dentate nucleus in the right cerebellum and immediately adjacent cerebellar parenchyma, concerning for recent and potentially acute infarct in this area.  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- anticoagulation recommended to be stopped for 10-14 days - H pyloli + started on triple therapy - Transaminitis thought to be du to Diflucan hepatotoxicity 8/17 - febrile again and vasopressors resumed 8/23- extubated 8/25 A-fib with HR up to 160s, fever 101.6 at 4 AM- care transferred to [facility]  Assessment & Plan:   Principal Problem:   Perforated duodenal bulb ulcer ([place]) Active Problems:   Acute on chronic combined  systolic and diastolic CHF (congestive heart failure) ([place])   Nonischemic cardiomyopathy (HCC)   Pneumoperitoneum   Acute respiratory failure with hypoxia (HCC)   Paroxysmal A-fib ([place])   CVA (cerebral vascular accident) ([place])   Arterial thrombosis ([place])   H pylori ulcer   Fatty infiltration of liver   DM (diabetes mellitus), type 2 (HCC)   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 pylori infection Complicated hospital course. Status post exploratory laparotomy, [place] patch repair 11/27/16 by Dr. Lucia Gaskins. Postoperatively developed abscess (16.7 x 5.6 x 7 cm), s/p IR drain 9/6 which was removed 9/12. Cultures growing multiple organisms and non pre dominant. Patient completed a course of triple antibiotic treatment for H. pylori infection  Intra-abdominal abscess As stated above, culture was polymicrobial. Patient has been on several IV antibiotics in the hospital. His last dose of IV antibiotics was daptomycin on 9/13 and Zosyn on 9/14.  I discussed with Dr. Johnnye Sima, ID on 9/18 who recommended no further antibiotics and did not recommend outpatient follow-up with ID. Monitor.  Sigmoid colocutaneous fistula As per general surgery follow-up, fistula will be left open, continue Eakin's pouch as managed by wound care, oral diet as tolerated and no further surgery is planned at this time. Patient wishes to continue medical treatment but does not wish for any further heroics, if medical treatment does not make him better he will consider hospice. Outpatient follow-up with Dr. Lucia Gaskins in 2 weeks.  MRSE bacteremia Infectious disease was consulted. Central  line out 12/24/16. TTE does not specifically mention endocarditis but given his overall comorbidities, would not check TEE. One of 2  surveillance blood cultures from 9/7 showed MRSE. His repeat blood culture suggests (but does not confirm) endocarditis, infection of his ICD. Even though it is suspected that patient's AICD might be infected, removal of AICD will undertake a major surgery which patient may not tolerate and patient only wishes medical treatment and does not want major surgeries at this time. Repeat surveillance blood culture 9/14: Negative, final report. ID recommends indefinite doxycycline 100 MG PO BID.  Septic shock Resolved.  Acute on chronic systolic and diastolic CHF: Repeat echo significant for improved EF of 25-30 percent from 15% with grade 2 diastolic dysfunction. Patient has significant peripheral/mostly upper extremity edema likely multifactorial from hypoalbuminemia/nutritional and CHF. May consider low-dose Lasix as BP and renal functions tolerate.  Paroxysmal atrial fibrillation [place] vasc 2 score  of at least 5. Continue Apixaban. Currently in sinus rhythm. Metoprolol discontinued due to hypotension.  Bibasilar atelectasis Incentive spirometry and follow chest x-ray periodically.  Hyponatremia Resolved  CVA Likely embolic from atrial fibrillation. Continue Eliquis. Aspirin discontinued 9/16.  Arterial thrombus right brachial artery Secondary to arterial line, provoked. Status post thrombectomy on 11/30/16. Currently on anticoagulation as indicated above.  Bilateral upper extremity edema Venous Doppler 8/23: No evidence of DVT or SVT involving the left upper extremity and right subclavian vein. Diuresis as indicated above. Trial of Lasix 10 mg oral 1 dose today.  Fatty liver Supportive care.  Pacer firing Was read as asystole which did not appear to be the case. Was seen by cardiology for device interrogation.  Cognitive impairment Stable.  Type II DM A1c 5.8. CBGs tightly controlled in the hospital. DC SSI and will likely not require any hypoglycemics at discharge.  Adult failure  to thrive Multifactorial due to multiple severe significant comorbidities. I discussed with Dr. Hilma Favors, palliative care team who in turn discussed with patient's 2 daughters at length and they are very reluctant to have patient discharged to SNF in [place] that is far away from home. Dr. Hilma Favors was unable to find a SNF locally and is exploring LTAC option.  Dysphagia As per speech therapy, continue dysphagia 3 diet and thin liquids.  Acute urinary retention Noted on 9/15-9/16. Foley's catheter reinserted. May need to discharge on Foley catheter until outpatient evaluation by urology.  Hypokalemia:  Replaced. Magnesium normal.  Anemia May be multifactorial due to critical illness and chronic disease. Stable.  Acute respiratory failure with hypoxia Intubated and successfully extubated. Resolved.     DVT prophylaxis: Eliquis Code Status: DO NOT RESUSCITATE Family Communication: I discussed in detail with patient's daughter at bedside and another doctor via phone. Surgical APP and patient's RN where at bedside. Discussed in detail regarding ongoing care and plans.   Disposition: Transfer from step down to medical bed on 9/19. Final discharge plans undetermined at this time.   Consultants:  General surgery PCCM Interventional radiology Palliative care medicine Infectious disease  Cardiology Vascular surgery Neurology  Procedures:   8/10- ex lap- patch repair of pyloric ucler  8/13- brachial embolectomy  8/12- EF 15%, diffuse hypokinesis and akinesis of inferolateral/inferioir myocardium  9/6- Percutaneous drain  9/7- Echocardiogram: EF of 00-93%, grade 2 diastolic dysfunction. No evidence of vegetations  Antimicrobials:  Multiple since admission Currently on oral doxycycline   Subjective: Patient denied complaints. No abdominal pain reported. Reportedly eating very little but drinks orange juice. Requested dietitian consultation. No dyspnea or  chest pain  reported.  ROS: Denies  Objective:  Vitals:   01/06/17 1211 01/06/17 1256 01/06/17 1600 01/06/17 1601  BP: 106/74  (!) 94/59   Pulse: 75  74   Resp: (!) 34  20   Temp: (!) 97.5 F (36.4 C) 98.1 F (36.7 C)    TempSrc: Oral Oral  Oral  SpO2: 100%  100%   Weight:      Height:        Examination:  Gen. exam: Pleasant elderly male, moderately built and nourished, lying comfortably propped up in bed. Does not appear in any distress. Respiratory system: Diminished breath sounds in the bases but no crackles. Rest of lung fields clear to auscultation. No increased work of breathing. Stable. Cardiovascular system: S1 and S2 heard, RRR. No JVD or murmurs. 1+ pitting bilateral leg edema and 2+ pitting bilateral upper extremity edema. Telemetry: Sinus rhythm with BBB morphology. Frequent PVCs, occasional nonsustained SVT. Abdominal exam: Eakin's pouch covering patient's abdominal wound draining liquidy yellow stools without bleeding. Removed the pouch and examined abdominal wound along with the surgical team, which looks clean without acute findings. Please see picture below taken on 9/18. Abdominal binder in place. Abdomen nondistended, soft and nontender. Normal bowel sounds heard. Central nervous system: Alert and oriented to person and place. No focal neurological deficits. Extremities: Significant extremity edema, per >lower. Psychiatry: Affect: Flat.   Picture taken on 01/05/17     Data Reviewed: I have personally reviewed following labs and imaging studies  CBC:  Recent Labs Lab 01/01/17 0831 01/02/17 0358 01/04/17 0400  WBC 4.2 3.9* 4.1  NEUTROABS  --  2.3  --   HGB 10.2* 9.4* 9.3*  HCT 31.6* 30.4* 30.0*  MCV 105.3* 105.2* 104.9*  PLT 242 287 754   Basic Metabolic Panel:  Recent Labs Lab 01/01/17 0831 01/03/17 0408 01/04/17 0400  NA 137 140 142  K 3.6 3.1* 3.9  CL 108 111 110  CO2 21* 22 25  GLUCOSE 76 79 81  BUN _0 CREATININE 0.75 0.63 0.73  CALCIUM  7.7* 8.0* 8.2*  MG  --  1.8 1.8   Liver Function Tests: No results for input(s): AST, ALT, ALKPHOS, BILITOT, PROT, ALBUMIN in the last 168 hours. Coagulation Profile: No results for input(s): INR, PROTIME in the last 168 hours. Cardiac Enzymes:  Recent Labs Lab 01/01/17 0831  CKTOTAL 49   HbA1C: No results for input(s): HGBA1C in the last 72 hours. CBG:  Recent Labs Lab 01/05/17 2302 01/06/17 0310 01/06/17 0746 01/06/17 1214 01/06/17 1604  GLUCAP 84 65 73 98 87    Recent Results (from the past 240 hour(s))  Culture, blood (Routine X 2) w Reflex to ID Panel     Status: None   Collection Time: 01/01/17 12:45 PM  Result Value Ref Range Status   Specimen Description BLOOD LEFT ARM  Final   Special Requests IN PEDIATRIC BOTTLE Blood Culture adequate volume  Final   Culture NO GROWTH 5 DAYS  Final   Report Status 01/06/2017 FINAL  Final         Radiology Studies: No results found.      Scheduled Meds: . apixaban  5 mg Oral Q12H  . chlorhexidine gluconate (MEDLINE KIT)  15 mL Mouth Rinse BID  . collagenase   Topical Daily  . doxycycline  100 mg Oral Q12H  . feeding supplement (ENSURE ENLIVE)  237 mL Oral BID BM  . mouth rinse  15 mL Mouth Rinse  QID  . midodrine  2.5 mg Oral TID WC  . pantoprazole  40 mg Oral BID  . sodium chloride flush  5 mL Intravenous Q8H   Continuous Infusions:   LOS: 40 days     Agron Swiney, MD, FACP, FHM. [facility] Pager [phone]  If 7PM-7AM, please contact night-coverage [email or web address] Password [facility] 01/06/2017, 5:39 PM

## 2017-01-06 NOTE — Telephone Encounter (Signed)
LMOVM for pt to return call in regard to his home monitor.

## 2017-01-06 NOTE — Progress Notes (Signed)
  Speech Language Pathology Treatment: Dysphagia  Patient Details Name: Jacob Pittman MRN: [id number] DOB: 1945-07-27 Today's Date: 01/06/2017 Time: 2505-3976 SLP Time Calculation (min) (ACUTE ONLY): 9 min  Assessment / Plan / Recommendation Clinical Impression  Pt's clinical presentation and personal wishes are similar to previous SLP visits. Signs of potential aspiration include immediate throat clearing and delayed cough while drinking Ensure, although he still remains afebrile. He politely declines solid foods again, but denies having any difficulty with them. He wants to continue eating despite the risks. Education has been completed with patient - SLP will sign off. Please reorder [place] if we can be of any further assistance.   HPI HPI: Pt is a 71 year old man with ischemic cardiomyopathy, DM, HTN, PAD, OSA who was admitted 8/9 with gastric ulcer perforation and peritonitis. He underwent emergent laparotomy with omental patch repair, postoperatively remained in shock requiring epinephrine and Levophed drip. Course c/b acute ischemia of R arm requiring tx from [facility] to [facility] and urgent OR embolectomy, also c/b slow vent wean. He was intubated 8/10-8/23. MBS completed 8/26 showed a severe oropharyngeal dysphagia with 100% of the bolus remaining in the vallecula resulting in delayed aspiration. Last SLP visit 8/31 due to medical issues preventing therapy. Per palliative care meeting 9/12, comfort is his primary goal. He wanted to d/c cortrak, begin PO diet, and avoid TPN now and in the future. Pt was started on a soft diet and thin liquids; SLP ordered to assess oropharyngeal function.      SLP Plan  All goals met       Recommendations  Diet recommendations: Dysphagia 3 (mechanical soft);Thin liquid Liquids provided via: Cup;Straw Medication Administration: Crushed with puree Supervision: Staff to assist with self feeding;Full supervision/cueing for compensatory strategies Compensations: Slow  rate;Small sips/bites;Multiple dry swallows after each bite/sip Postural Changes and/or Swallow Maneuvers: Seated upright 90 degrees;Upright 30-60 min after meal                Oral Care Recommendations: Oral care QID Follow up Recommendations: Skilled Nursing facility SLP Visit Diagnosis: Dysphagia, oropharyngeal phase (R13.12) Plan: All goals met       GO                Germain Osgood 01/06/2017, 11:47 AM  Germain Osgood, M.A. CCC-SLP [phone]

## 2017-01-06 NOTE — Consult Note (Signed)
   [facility] Inpatient Consult   01/06/2017  Jacob Pittman [birth year] [id number]    Dr. Phillips Odor and [facility] Care Management Director discussed case.  [facility] Care Management unable to provide nursing level of care for [facility]) wound care at [facility] or for 24 hr coverage at [facility].  Dr. Phillips Odor made aware.  Discussed that LTAC referral has already been made.  Appreciative of outreach by Dr. Phillips Odor however.  Will touch base with inpatient LCSW about above.   Raiford Noble, MSN-Ed, RN,BSN [facility] Liaison [phone]

## 2017-01-07 LAB — BASIC METABOLIC PANEL
ANION GAP: 8 (ref 5–15)
BUN: 11 mg/dL (ref 6–20)
CHLORIDE: 104 mmol/L (ref 101–111)
CO2: 24 mmol/L (ref 22–32)
Calcium: 8.1 mg/dL — ABNORMAL LOW (ref 8.9–10.3)
Creatinine, Ser: 0.73 mg/dL (ref 0.61–1.24)
GFR calc Af Amer: >60 mL/min (ref >60)
Glucose, Bld: 73 mg/dL (ref 65–99)
POTASSIUM: 4.4 mmol/L (ref 3.5–5.1)
SODIUM: 136 mmol/L (ref 135–145)

## 2017-01-07 MED ORDER — FUROSEMIDE 20 MG PO TABS
20.0000 mg | ORAL_TABLET | Freq: Every day | ORAL | Status: DC
  Administered 2017-01-07 – 2017-01-12 (×6): 20 mg via ORAL
  Filled 2017-01-07 (×6): qty 1

## 2017-01-07 NOTE — Telephone Encounter (Signed)
2nd attempt  LMOVM for pt to return call.  

## 2017-01-07 NOTE — Progress Notes (Signed)
PROGRESS NOTE   Jacob Pittman  CBJ:[id number]    DOB: [birth year]    DOA: 11/26/2016  PCP: Biagio Borg, MD   I have briefly reviewed patients previous medical records in [facility].  Brief Narrative:  71 year old male with EF 15% (ischemic cardiomyopathy) status post AICD, HTN, DM, PAD, OSA, and overall poor health, admitted on 8/9 due to abdominal pain and found to have perforated pyloric channel ulcer and pneumoperitonitis. He underwent emergent laparotomy with omental patch repair, postoperatively remained in shock requiring epinephrine and levophed drips. Course complicated by acute ischemia of right arm requiring transfer from [facility] to [facility] and urgent OR embolectomy and also complicated by slow vent weaning. He was transferred from [facility] service to the hospitalist service on 12/12/16. General surgery continue to see.  Events:  8/12-  TTE LV moderately dilated with EF 15% &diffuse hypokinesis. There is akinesis of the inferolateral and inferior myocardium.  E coli UTI noted 8/13-  right brachial A- line placed and then developed ischemic right arm- vascular consult and transfer from [facility] to [facility]- thrombectomy of brachial artery- heparin Paroxysmal A-fib also noted 8/14- Decreased attenuation involving the right dentate nucleus in the right cerebellum and immediately adjacent cerebellar parenchyma, concerning for recent and potentially acute infarct in this area.  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- anticoagulation recommended to be stopped for 10-14 days - H pyloli + started on triple therapy - Transaminitis thought to be du to Diflucan hepatotoxicity 8/17 - febrile again and vasopressors resumed 8/23- extubated 8/25 A-fib with HR up to 160s, fever 101.6 at 4 AM- care transferred to [facility]  Assessment & Plan:   Principal Problem:   Perforated duodenal bulb ulcer ([place]) Active Problems:   Acute on chronic combined  systolic and diastolic CHF (congestive heart failure) ([place])   Nonischemic cardiomyopathy (HCC)   Pneumoperitoneum   Acute respiratory failure with hypoxia (HCC)   Paroxysmal A-fib ([place])   CVA (cerebral vascular accident) ([place])   Arterial thrombosis ([place])   H pylori ulcer   Fatty infiltration of liver   DM (diabetes mellitus), type 2 (HCC)   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 pylori infection Complicated hospital course. Status post exploratory laparotomy, Graham patch repair 11/27/16 by Dr. Lucia Gaskins. Postoperatively developed abscess (16.7 x 5.6 x 7 cm), s/p IR drain 9/6 which was removed 9/12. Cultures growing multiple organisms and non pre dominant. Patient completed a course of triple antibiotic treatment for H. pylori infection  Intra-abdominal abscess As stated above, culture was polymicrobial. Patient has been on several IV antibiotics in the hospital. His last dose of IV antibiotics was daptomycin on 9/13 and Zosyn on 9/14.  I discussed with Dr. Johnnye Sima, ID on 9/18 who recommended no further antibiotics and did not recommend outpatient follow-up with ID. Monitor.  Sigmoid colocutaneous fistula As per general surgery follow-up, fistula will be left open, continue Eakin's pouch as managed by wound care, oral diet as tolerated and no further surgery is planned at this time. Patient wishes to continue medical treatment but does not wish for any further heroics, if medical treatment does not make him better he will consider hospice. Outpatient follow-up with Dr. Lucia Gaskins in 2 weeks.  MRSE bacteremia Infectious disease was consulted. Central  line out 12/24/16. TTE does not specifically mention endocarditis but given his overall comorbidities, would not check TEE. One of 2  surveillance blood cultures from 9/7 showed MRSE. His repeat blood culture suggests (but does not confirm) endocarditis, infection of his ICD. Even though it is suspected that patient's AICD might be infected, removal of AICD will undertake a major surgery which patient may not tolerate and patient only wishes medical treatment and does not want major surgeries at this time. Repeat surveillance blood culture 9/14: Negative, final report. ID recommends indefinite doxycycline 100 MG PO BID.  Septic shock Resolved.  Acute on chronic systolic and diastolic CHF: Repeat echo significant for improved EF of 25-30 percent from 15% with grade 2 diastolic dysfunction. Patient has significant peripheral/mostly upper extremity edema likely multifactorial from hypoalbuminemia/nutritional and CHF.  Tolerated low-dose oral Lasix last night and put out approximately 1 L urine. Started Lasix 20 mg daily. Monitor closely. Strict intake output. Periodically follow BMP.  Paroxysmal atrial fibrillation [place] vasc 2 score  of at least 5. Continue Apixaban. Currently in sinus rhythm. Metoprolol discontinued due to hypotension.  Bibasilar atelectasis Incentive spirometry and follow chest x-ray periodically.  Hyponatremia Resolved  CVA Likely embolic from atrial fibrillation. Continue Eliquis. Aspirin discontinued 9/16.  Arterial thrombus right brachial artery Secondary to arterial line, provoked. Status post thrombectomy on 11/30/16. Currently on anticoagulation as indicated above.  Bilateral upper extremity edema Venous Doppler 8/23: No evidence of DVT or SVT involving the left upper extremity and right subclavian vein. Diuresis as indicated above. Trial of Lasix 10 mg oral 1 dose today, good effect, edema slightly better. Continue PO Lasix.  Fatty liver Supportive care.  Pacer firing Was read as asystole which did not appear to be the case. Was seen by cardiology for device interrogation.  Cognitive  impairment Stable.  Type II DM A1c 5.8. CBGs tightly controlled in the hospital. DC SSI and will likely not require any hypoglycemics at discharge.  Adult failure to thrive Multifactorial due to multiple severe significant comorbidities. I discussed with Dr. Hilma Favors, palliative care team who in turn discussed with patient's 2 daughters at length and they are very reluctant to have patient discharged to SNF in [place] that is far away from home. Dr. Hilma Favors was unable to find a SNF locally and is exploring LTAC option.  Dysphagia As per speech therapy, continue dysphagia 3 diet and thin liquids.  Acute urinary retention Noted on 9/15-9/16. Foley's catheter reinserted. May need to discharge on Foley catheter until outpatient evaluation by urology.  Hypokalemia:  Replaced. Magnesium normal.  Anemia May be multifactorial due to critical illness and chronic disease. Stable.  Acute respiratory failure with hypoxia Intubated and successfully extubated. Resolved.     DVT prophylaxis: Eliquis Code Status: DO NOT RESUSCITATE Family Communication: None at bedside today..   Disposition: Transfer from step down to medical bed on 9/19. LTAC placement currently being pursued.   Consultants:  General surgery PCCM Interventional radiology Palliative care medicine Infectious disease  Cardiology Vascular surgery Neurology  Procedures:   8/10- ex lap- patch repair of pyloric ucler  8/13- brachial embolectomy  8/12- EF 15%, diffuse hypokinesis and akinesis of inferolateral/inferioir myocardium  9/6- Percutaneous drain  9/7- Echocardiogram: EF of 46-96%, grade 2 diastolic dysfunction. No evidence of vegetations  Antimicrobials:  Multiple since admission Currently on oral doxycycline   Subjective: Patient states that she is feeling better. Reports decreased swelling in his upper extremities. Sitting up at edge of bed and getting ready to  transfer to chair with assistance. No  abdominal pain, nausea or vomiting reported.  ROS: Denies  Objective:  Vitals:   01/06/17 1914 01/06/17 2114 01/07/17 1035 01/07/17 1533  BP:  (!) 116/59 (!) 103/53 (!) 102/43  Pulse:  86 85 81  Resp:  19  19  Temp: 98.3 F (36.8 C) 98.1 F (36.7 C)  97.8 F (36.6 C)  TempSrc: Oral Oral  Oral  SpO2:  98% 96% 98%  Weight:      Height:        Examination:  Gen. exam: Pleasant elderly male, moderately built and nourished, Sitting up at edge of bed and getting ready to transfer to chair with assistance. Respiratory system: clear to auscultation. No increased work of breathing. Stable. Cardiovascular system: S1 and S2 heard, RRR. No JVD or murmurs. 1+ pitting bilateral leg edema and 2+ pitting bilateral upper extremity edema, slightly better than yesterday.  Abdominal exam: Eakin's pouch covering patient's abdominal wound draining liquidy yellow stools without bleeding. Removed the pouch and examined abdominal wound along with the surgical team, which looks clean without acute findings. Please see picture below taken on 9/18. Abdominal binder in place. Abdomen nondistended, soft and nontender. Normal bowel sounds heard. Central nervous system: Alert and oriented to person and place. No focal neurological deficits. Extremities: Significant extremity edema, per >lower. Psychiatry: Affect: Pleasant and interactive. Seems to be in good spirits.   Picture taken on 01/05/17     Data Reviewed: I have personally reviewed following labs and imaging studies  CBC:  Recent Labs Lab 01/01/17 0831 01/02/17 0358 01/04/17 0400  WBC 4.2 3.9* 4.1  NEUTROABS  --  2.3  --   HGB 10.2* 9.4* 9.3*  HCT 31.6* 30.4* 30.0*  MCV 105.3* 105.2* 104.9*  PLT 242 287 034   Basic Metabolic Panel:  Recent Labs Lab 01/01/17 0831 01/03/17 0408 01/04/17 0400 01/07/17 0457  NA 137 140 142 136  K 3.6 3.1* 3.9 4.4  CL 108 111 110 104  CO2 21* _0 GLUCOSE 76 79 81 73  BUN _1 CREATININE 0.75 0.63 0.73 0.73  CALCIUM 7.7* 8.0* 8.2* 8.1*  MG  --  1.8 1.8  --    Liver Function Tests: No results for input(s): AST, ALT, ALKPHOS, BILITOT, PROT, ALBUMIN in the last 168 hours. Coagulation Profile: No results for input(s): INR, PROTIME in the last 168 hours. Cardiac Enzymes:  Recent Labs Lab 01/01/17 0831  CKTOTAL 49   HbA1C: No results for input(s): HGBA1C in the last 72 hours. CBG:  Recent Labs Lab 01/06/17 0310 01/06/17 0746 01/06/17 1214 01/06/17 1604 01/06/17 1929  GLUCAP 65 73 98 87 91    Recent Results (from the past 240 hour(s))  Culture, blood (Routine X 2) w Reflex to ID Panel     Status: None   Collection Time: 01/01/17 12:45 PM  Result Value Ref Range Status   Specimen Description BLOOD LEFT ARM  Final   Special Requests IN PEDIATRIC BOTTLE Blood Culture adequate volume  Final   Culture NO GROWTH 5 DAYS  Final   Report Status 01/06/2017 FINAL  Final         Radiology Studies: No results found.      Scheduled Meds: . apixaban  5 mg Oral Q12H  . chlorhexidine gluconate (MEDLINE KIT)  15 mL Mouth Rinse BID  . collagenase   Topical Daily  . doxycycline  100 mg Oral Q12H  . feeding  supplement (ENSURE ENLIVE)  237 mL Oral BID BM  . furosemide  20 mg Oral Daily  . mouth rinse  15 mL Mouth Rinse QID  . midodrine  2.5 mg Oral TID WC  . pantoprazole  40 mg Oral BID  . sodium chloride flush  5 mL Intravenous Q8H   Continuous Infusions:   LOS: 85 days     Maronda Caison, MD, FACP, FHM. [facility] Pager [phone]  If 7PM-7AM, please contact night-coverage [email or web address] Password [email or web address] 01/07/2017, 4:45 PM

## 2017-01-07 NOTE — Progress Notes (Signed)
Pt sat in chair. Wound care was completed.

## 2017-01-07 NOTE — Care Management Note (Signed)
Awaiting insurance authorization for [facility]. Family would like [facility] and than transfer to [facility]  Case Management Note  Patient Details  Name: Jacob Pittman MRN: [id number] Date of Birth: [birth year]  Subjective/Objective:                    Action/Plan:   Expected Discharge Date:   (UNKNOWN)               Expected Discharge Plan:  Long Term Acute Care (LTAC)  In-House Referral:  Clinical Social Work  Discharge planning Services  CM Consult  Post Acute Care Choice:    Choice offered to:     DME Arranged:    DME Agency:     HH Arranged:    HH Agency:     Status of Service:  In process, will continue to follow  If discussed at Long Length of Stay Meetings, dates discussed:    Additional Comments:  Kingsley Plan, RN 01/07/2017, 3:29 PM

## 2017-01-08 DIAGNOSIS — Z9889 Other specified postprocedural states: Secondary | ICD-10-CM

## 2017-01-08 LAB — BASIC METABOLIC PANEL
ANION GAP: 5 (ref 5–15)
BUN: 9 mg/dL (ref 6–20)
CALCIUM: 8.2 mg/dL — AB (ref 8.9–10.3)
CO2: 26 mmol/L (ref 22–32)
CREATININE: 0.75 mg/dL (ref 0.61–1.24)
Chloride: 104 mmol/L (ref 101–111)
GLUCOSE: 72 mg/dL (ref 65–99)
Potassium: 4.1 mmol/L (ref 3.5–5.1)
Sodium: 135 mmol/L (ref 135–145)

## 2017-01-08 MED ORDER — DOCUSATE SODIUM 100 MG PO CAPS
100.0000 mg | ORAL_CAPSULE | Freq: Every day | ORAL | Status: DC | PRN
  Administered 2017-01-08: 100 mg via ORAL
  Filled 2017-01-08: qty 1

## 2017-01-08 NOTE — Progress Notes (Signed)
Physical Therapy Treatment Patient Details Name: Jacob Pittman MRN: [id number] DOB: [birth year] Today's Date: 01/08/2017    History of Present Illness Patient is a 71 yo male admitted 11/26/16 with perforated duodenal bulb ulcer, now s/p exploratory lap and repair on 11/27/16. Postoperatively remained in shock requiring epinephrine and Levophed drip. Also complicated by ischemic RUE and is s/p embolectomy.  Patient was slow to wean from vent, extubated on 12/10/16.  Patient with confusion.     PMH:   ICM, DM, HTN, PAD, OSA, CHF, EF 15%, ICD, PAF, OA    PT Comments    Pt very pleasant and initially not moving bil UE . With assist performed RUE ROM and was then able to reach his face and was feeding himself end of session. Pt educated for transfers and HEP and is moving much better today then he has for some time with ability to step and pivot with use of RW. Will continue to follow to maximize function.    Follow Up Recommendations  SNF;Supervision/Assistance - 24 hour     Equipment Recommendations       Recommendations for Other Services       Precautions / Restrictions Precautions Precautions: Fall Precaution Comments: ostomy, abdominal binder    Mobility  Bed Mobility Overal bed mobility: Needs Assistance Bed Mobility: Supine to Sit     Supine to sit: Mod assist;+2 for physical assistance     General bed mobility comments: cues for sequence with assist to fully clear legs off of bed and elevate trunk. Semi rolling to left with semi sidely to sit with use of rail   Transfers Overall transfer level: Needs assistance   Transfers: Sit to/from Stand;Stand Pivot Transfers Sit to Stand: Mod assist;+2 physical assistance;From elevated surface Stand pivot transfers: Min assist;+2 physical assistance       General transfer comment: pt with assist for anterior translation and rise from surface with increased time to complete transition and cues for hip and trunk extension in  standing. Pt stood 45 sec with bil UE support by P.T. and tech, sat then stood again with use of RW to take pivotal steps to chair with assist to direct RW and cues for sequence  Ambulation/Gait             General Gait Details: pivotal steps to chair   Stairs            Wheelchair Mobility    Modified Rankin (Stroke Patients Only)       Balance Overall balance assessment: Needs assistance   Sitting balance-Leahy Scale: Fair       Standing balance-Leahy Scale: Poor                              Cognition Arousal/Alertness: Awake/alert Behavior During Therapy: WFL for tasks assessed/performed Overall Cognitive Status: No family/caregiver present to determine baseline cognitive functioning Area of Impairment: Orientation;Memory                 Orientation Level: Disoriented to;Time Current Attention Level: Sustained   Following Commands: Follows one step commands with increased time     Problem Solving: Slow processing;Decreased initiation;Difficulty sequencing;Requires verbal cues;Requires tactile cues General Comments: pt disoriented to day, increased time for command following      Exercises General Exercises - Lower Extremity Long Arc Quad: AROM;Both;Seated;10 reps Hip ABduction/ADduction: AROM;Both;Seated;10 reps Hip Flexion/Marching: AROM;Both;Seated;10 reps    General Comments  Pertinent Vitals/Pain Pain Score: 4  Pain Location: RUE with movement Pain Descriptors / Indicators: Sore Pain Intervention(s): Limited activity within patient's tolerance;Repositioned    Home Living                      Prior Function            PT Goals (current goals can now be found in the care plan section) Acute Rehab PT Goals Time For Goal Achievement: 01/22/17 Progress towards PT goals: Progressing toward goals;Goals downgraded-see care plan    Frequency    Min 2X/week      PT Plan Current plan remains  appropriate    Co-evaluation              AM-PAC PT "6 Clicks" Daily Activity  Outcome Measure  Difficulty turning over in bed (including adjusting bedclothes, sheets and blankets)?: Unable Difficulty moving from lying on back to sitting on the side of the bed? : Unable Difficulty sitting down on and standing up from a chair with arms (e.g., wheelchair, bedside commode, etc,.)?: Unable Help needed moving to and from a bed to chair (including a wheelchair)?: A Lot Help needed walking in hospital room?: A Lot Help needed climbing 3-5 steps with a railing? : Total 6 Click Score: 8    End of Session Equipment Utilized During Treatment: Gait belt Activity Tolerance: Patient tolerated treatment well Patient left: in chair;with chair alarm set;with call bell/phone within reach Nurse Communication: Mobility status;Precautions       Time: 7473-4037 PT Time Calculation (min) (ACUTE ONLY): 32 min  Charges:  $Therapeutic Exercise: 8-22 mins $Therapeutic Activity: 8-22 mins                    G Codes:       [name], PT [phone]   [name] 01/08/2017, 10:09 AM

## 2017-01-08 NOTE — Progress Notes (Signed)
PROGRESS NOTE    Jacob Pittman  KGU:[id number] DOB: [birth year] DOA: 11/26/2016 PCP: Biagio Borg, MD    Brief Narrative:  71 yo male presented with abdominal pain. Patient has significant medical history for systolic heart failure, HTN, DM, and osteoarthritis. Presented with worsening abdominal pain for 3 days, associated with nausea and vomiting. His abdominal CT showed pneumoperitoneum and was taken emergently to the operation room. On the initial physical examination his blood pressure 123/73, heart rate 84, rr 14 with oxygen saturation 92%. He was sedated on invasive mechanical ventilation, lungs were clear to auscultation, heart s1 and s2 present, no gallops, rubs or murmurs, abdomen distended, large mid surgical wound with dressing in place. No lower extremity edema.   Patient was admitted to the ICU with working diagnosis of perforated pyloric channel ulcer, sp repair. Patient had prolonged hospital stay, complicated with acute ischemic event on right upper extremity, sp embolectomy.   Assessment & Plan:   Principal Problem:   Perforated duodenal bulb ulcer ([place]) Active Problems:   Acute on chronic combined systolic and diastolic CHF (congestive heart failure) (HCC)   Nonischemic cardiomyopathy (HCC)   Pneumoperitoneum   Acute respiratory failure with hypoxia (HCC)   Paroxysmal A-fib (HCC)   CVA (cerebral vascular accident) ([place])   Arterial thrombosis (HCC)   H pylori ulcer   Fatty infiltration of liver   DM (diabetes mellitus), type 2 (HCC)   Pressure sore on heel, left, unstageable (HCC)   Poor venous access   Palliative care encounter   Goals of care, counseling/discussion   Bowel perforation ([place])   Sepsis ([place])   Coag negative Staphylococcus bacteremia   1. Perforated pyloric ulcer. Patient tolerating po well, no nausea or vomiting. Continue antiacid therapy with pantoprazole.   2. Heart failure systolic, acute on chronic. Positive edema on the right upper  extremity, likely due to third spacing, otherwise euvolemic, no jvd or dyspnea, will continue furosemide 20 mg daily to keep negative fluid balance. Continue midodrine for now.  3. MRSA bacteremia. Patient on doxycycline po plant to continue indefinite for now, questionable infected aicd device, no further IV antibiotics.  4. Paroxysmal atrial fibrillation. Remains rate controlled, will continue anticoagulation with apixaban.   5. Hx of CVA. Stable, patient very deconditioned, will need SNF at discharge.   6. T2DM. Fasting glucose 72, patient tolerating po well, no nausea or vomiting.   7. Urinary retention. Patient with foley catheter in place.   8. Arterial thrombus on right brachial artery. Recovering well from thrombectomy, no pain, will continue medical management, including anticoagulation.     DVT prophylaxis: scd  Code Status: DNR Family Communication:  Disposition Plan:    Consultants:  Surgery    Procedures:    Procedure(s): EXPLORATORY LAPAROTOMY, Oversew pyloric channel ulcer, graham omental patch repair perforated ulcer. 08/9.   Antimicrobials:       Subjective: Patient feeling well, no nausea or vomiting, tolerating po well, no dyspnea or chest pain, no abdominal pain, positive weakness and difficulty ambulating.   Objective: Vitals:   01/07/17 1035 01/07/17 1533 01/07/17 2136 01/08/17 0609  BP: (!) 103/53 (!) 102/43 (!) 96/51 (!) 91/57  Pulse: 85 81 76 72  Resp:  _0 Temp:  97.8 F (36.6 C) 98.2 F (36.8 C) 97.7 F (36.5 C)  TempSrc:  Oral Oral Oral  SpO2: 96% 98% 99% 96%  Weight:      Height:        Intake/Output Summary (Last 24 hours)  at 01/08/17 1401 Last data filed at 01/08/17 0612  Gross per 24 hour  Intake              240 ml  Output             1870 ml  Net            -1630 ml   Filed Weights   12/29/16 0300 12/30/16 0400 01/02/17 0349  Weight: 82.5 kg (181 lb 14.1 oz) 84.3 kg (185 lb 13.6 oz) 84.6 kg (186 lb 8.2 oz)     Examination:  General: deconditioned, not in pain or dyspnea Neurology: Awake and alert, non focal  E ENT: mild pallor, no icterus, oral mucosa moist Cardiovascular: No JVD. S1-S2 present, rhythmic, no gallops, rubs, or murmurs. +/++ pitting lower extremity edema. Pulmonary: vesicular breath sounds bilaterally, adequate air movement, no wheezing, rhonchi or rales. Gastrointestinal. Abdomen distended, no organomegaly, non tender, no rebound or guarding Skin. No rashes Musculoskeletal: right upper extremity with pitting edema +++/      Data Reviewed: I have personally reviewed following labs and imaging studies  CBC:  Recent Labs Lab 01/02/17 0358 01/04/17 0400  WBC 3.9* 4.1  NEUTROABS 2.3  --   HGB 9.4* 9.3*  HCT 30.4* 30.0*  MCV 105.2* 104.9*  PLT 287 619   Basic Metabolic Panel:  Recent Labs Lab 01/03/17 0408 01/04/17 0400 01/07/17 0457 01/08/17 0520  NA 140 142 136 135  K 3.1* 3.9 4.4 4.1  CL 111 110 104 104  CO2 _0 GLUCOSE 79 81 73 72  BUN _1 CREATININE 0.63 0.73 0.73 0.75  CALCIUM 8.0* 8.2* 8.1* 8.2*  MG 1.8 1.8  --   --    GFR: Estimated Creatinine Clearance: 92.9 mL/min (by C-G formula based on SCr of 0.75 mg/dL). Liver Function Tests: No results for input(s): AST, ALT, ALKPHOS, BILITOT, PROT, ALBUMIN in the last 168 hours. No results for input(s): LIPASE, AMYLASE in the last 168 hours. No results for input(s): AMMONIA in the last 168 hours. Coagulation Profile: No results for input(s): INR, PROTIME in the last 168 hours. Cardiac Enzymes: No results for input(s): CKTOTAL, CKMB, CKMBINDEX, TROPONINI in the last 168 hours. BNP (last 3 results) No results for input(s): PROBNP in the last 8760 hours. HbA1C: No results for input(s): HGBA1C in the last 72 hours. CBG:  Recent Labs Lab 01/06/17 0310 01/06/17 0746 01/06/17 1214 01/06/17 1604 01/06/17 1929  GLUCAP 65 73 98 87 91   Lipid Profile: No results for input(s):  CHOL, HDL, LDLCALC, TRIG, CHOLHDL, LDLDIRECT in the last 72 hours. Thyroid Function Tests: No results for input(s): TSH, T4TOTAL, FREET4, T3FREE, THYROIDAB in the last 72 hours. Anemia Panel: No results for input(s): VITAMINB12, FOLATE, FERRITIN, TIBC, IRON, RETICCTPCT in the last 72 hours.    Radiology Studies: I have reviewed all of the imaging during this hospital visit personally     Scheduled Meds: . apixaban  5 mg Oral Q12H  . chlorhexidine gluconate (MEDLINE KIT)  15 mL Mouth Rinse BID  . collagenase   Topical Daily  . doxycycline  100 mg Oral Q12H  . feeding supplement (ENSURE ENLIVE)  237 mL Oral BID BM  . furosemide  20 mg Oral Daily  . mouth rinse  15 mL Mouth Rinse QID  . midodrine  2.5 mg Oral TID WC  . pantoprazole  40 mg Oral BID  . sodium chloride flush  5 mL Intravenous Q8H  Continuous Infusions:   LOS: 42 days        Jaleigh Mccroskey Gerome Apley, MD [facility] Pager [phone]

## 2017-01-08 NOTE — Care Management Note (Signed)
Case Management Note  Patient Details  Name: Jacob Pittman MRN: [id number] Date of Birth: 08-17-1945  Subjective/Objective:                    Action/Plan:  Insurance has denied LTAC. Malachy Chamber asking for peer to peer . Paged attending , awaiting call back. Loury reached out to DR Phillips Odor she will do peer to peer. Expected Discharge Date:   (UNKNOWN)               Expected Discharge Plan:  Long Term Acute Care (LTAC)  In-House Referral:  Clinical Social Work  Discharge planning Services  CM Consult  Post Acute Care Choice:    Choice offered to:     DME Arranged:    DME Agency:     HH Arranged:    HH Agency:     Status of Service:  In process, will continue to follow  If discussed at Long Length of Stay Meetings, dates discussed:    Additional Comments:  Kingsley Plan, RN 01/08/2017, 12:32 PM

## 2017-01-09 LAB — BASIC METABOLIC PANEL
Anion gap: 7 (ref 5–15)
BUN: 10 mg/dL (ref 6–20)
CALCIUM: 8.2 mg/dL — AB (ref 8.9–10.3)
CO2: 25 mmol/L (ref 22–32)
Chloride: 104 mmol/L (ref 101–111)
Creatinine, Ser: 0.9 mg/dL (ref 0.61–1.24)
GFR calc Af Amer: >60 mL/min (ref >60)
Glucose, Bld: 78 mg/dL (ref 65–99)
POTASSIUM: 4.5 mmol/L (ref 3.5–5.1)
SODIUM: 136 mmol/L (ref 135–145)

## 2017-01-09 NOTE — Progress Notes (Signed)
Palliative care following- patient has complexity of care most appropriate for LTACH. Working with CM, [facility] and patients insurance to determine coverage.  Anderson Malta, DO Palliative Medicine

## 2017-01-09 NOTE — Progress Notes (Addendum)
Patient ID: Jacob Pittman, male   DOB: 31-May-1945, 71 y.o.   MRN: [id number]  PROGRESS NOTE    Jacob Pittman  WUJ:[id number] DOB: [birth year] DOA: 11/26/2016  PCP: [name], MD   Brief Narrative:   Per brief narrative 32/10 "71 year old male with EF 15% (ischemic cardiomyopathy) status post AICD, HTN, DM, PAD, OSA, and overall poor health, admitted on 8/9 due to abdominal pain and found to have perforated pyloric channel ulcer and pneumoperitonitis. He underwent emergent laparotomy with omental patch repair, postoperatively remained in shock requiring epinephrine and levophed drips. Course complicated by acute ischemia of right arm requiring transfer from [facility] to [facility] and urgent OR embolectomy and also complicated by slow vent weaning. He was transferred from [facility] service to the hospitalist service on 12/12/16."  Significant events: 8/12- TTELV moderately dilated with EF 15% &diffuse hypokinesis. There is akinesis of the inferolateral and inferior myocardium. E coli UTI noted 8/13- right brachial A- line placed and then developed ischemic right arm- vascular consult and transfer [facility]- thrombectomy of brachial artery- heparin Paroxysmal A-fib also noted 8/14-Decreased attenuation involving the right dentate nucleus in the right cerebellum and immediately adjacent cerebellar parenchyma, concerning for recent and potentially acute infarct in this area.  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- anticoagulation recommended to be stopped for 10-14 days - H pyloli + started on triple therapy - Transaminitis thought to be du to Diflucan hepatotoxicity 8/17- febrile again and vasopressors resumed 8/23- extubated 8/25A-fib with HR up to 160s, fever 101.6 at 4 AM-care transferred to [facility]   Assessment & Plan:   Perforated pyloric ulcer/H. pylori infection - Complicated hospital course - S/P exploratory laparotomy,  graham patch repair 11/27/16 by Dr. Ezzard Standing - Developed postoperative abscess (16.7 x 5.6 x 7 cm) - S/P IR drain 9/6 which was removed 9/12 - Cx grew multiple organisms but none predominant - Completed course of triple abx for H.Pylori infection  Intra-abdominal abscess - Cx polymicrobial - Completed abx  Sigmoid colocutaneous fistula - As per surgery, fistula will be left open - Continue Eakin's pouch as managed by wound care - Outpt follow up with surgery   MRSE bacteremia - ID consulted - Central line out 9/6 - TTE does not specifically mention endocarditis but given overall comorbiditeis TEE was not done - One of 2 cx from 9/7 grew MRSE - Repeat blood cx suggestive but not confirming endocarditis, possible infection of AICD. 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 - Continue apixaban  Arterial thrombus right brachial artery - Secondary to A-line, provoked - S/P thrombectomy 11/30/16 - On apixaban at this time  Bilateral upper extremity edema - No DVT on UE doppler - Has gotten lasix 10 mg oral dose  Fatty liver - Continue supportive care   Pacer firing - Seen by cardio for device interrogation   Cognitive impairment - Stable  Type II DM without complications  -  A1c 5.8 - CBG's controlled   Adult failure to thrive - Exploring LTAC placement   Dysphagia - Dysphagia 2 diet  Acute urinary retention - Noted on 9/15, foley inserted    Hypokalemia - Due to acute illness - Supplemented   Anemia of chronic illness - Hgb stable  Acute respiratory failure with hypoxia - Intubated and successfully extubated - Resolved    DVT prophylaxis: On Apixaban  Code Status: DNR/DNI Family Communication: no family at the bedside Disposition Plan: to LTAC depending on insurance approval    Consultants:   General surgery  PCCM  Interventional radiology  Palliative care medicine  Infectious disease   Cardiology  Vascular surgery  Neurology  Procedures:   8/10- ex lap- patch repair of pyloric ucler  8/13- brachial embolectomy  8/12- EF 15%, diffuse hypokinesis and akinesis of inferolateral/inferioir myocardium  9/6- Percutaneous drain  9/7- Echocardiogram: EF of 25-30%, grade 2 diastolic dysfunction. No evidence of vegetations  Antimicrobials:   Doxycycline --> indefinitely    Subjective: No overnight events.  Objective: Vitals:   01/08/17 0609 01/08/17 1403 01/08/17 2021 01/09/17 0500  BP: (!) 91/57 100/84 (!) 94/52 (!) 91/57  Pulse: 72 60 67 78  Resp: Temp: 97.7 F (36.5 C) 98.6 F (37 C) 98.2 F (36.8 C) 98.1 F (36.7 C)  TempSrc: Oral Oral Oral Oral  SpO2: 96% 100% 97% 96%  Weight:      Height:        Intake/Output Summary (Last 24 hours) at 01/09/17 1055 Last data filed at 01/09/17 0500  Gross per 24 hour  Intake              360 ml  Output             2120 ml  Net            -1760 ml   Filed Weights   12/29/16 0300 12/30/16 0400 01/02/17 0349  Weight: 82.5 kg (181 lb 14.1 oz) 84.3 kg (185 lb 13.6 oz) 84.6 kg (186 lb 8.2 oz)    Examination:  General exam: Appears calm and comfortable  Respiratory system: Clear to auscultation. Respiratory effort normal. Cardiovascular system: S1 & S2 heard, RRR Gastrointestinal system: (+) BS, has abd binder  Central nervous system: Alert and oriented. No focal neurological deficits. Extremities: Symmetric 5 x 5  power. Skin: No rashes, lesions or ulcers Psychiatry: Judgement and insight appear normal. Mood & affect appropriate.   Data Reviewed: I have personally reviewed following labs and imaging studies  CBC:  Recent Labs Lab 01/04/17 0400  WBC 4.1  HGB 9.3*  HCT 30.0*  MCV 104.9*  PLT 284   Basic Metabolic Panel:  Recent Labs Lab 01/03/17 0408 01/04/17 0400 01/07/17 0457 01/08/17 0520 01/09/17 0703  NA 140 142 136 135 136  K 3.1* 3.9 4.4 4.1 4.5  CL 111 110 104 104 104  CO2 GLUCOSE 79 81 73 72 78  BUN CREATININE 0.63 0.73 0.73 0.75 0.90  CALCIUM 8.0* 8.2* 8.1* 8.2* 8.2*  MG 1.8 1.8  --   --   --    GFR: Estimated Creatinine Clearance: 82.6 mL/min (by C-G formula based on SCr of 0.9 mg/dL). Liver Function Tests: No results for input(s): AST, ALT, ALKPHOS, BILITOT, PROT, ALBUMIN in the last 168 hours. No results for input(s): LIPASE, AMYLASE in the last 168 hours. No results for input(s): AMMONIA in  the last 168 hours. Coagulation Profile: No results for input(s): INR, PROTIME in the last 168 hours. Cardiac Enzymes: No results for input(s): CKTOTAL, CKMB, CKMBINDEX, TROPONINI in the last 168 hours. BNP (last 3 results) No results for input(s): PROBNP in the last 8760 hours. HbA1C: No results for input(s): HGBA1C in the last 72 hours. CBG:  Recent Labs Lab 01/06/17 0310 01/06/17 0746 01/06/17 1214 01/06/17 1604 01/06/17 1929  GLUCAP 65 73 98 87 91   Lipid Profile: No results for input(s): CHOL, HDL, LDLCALC, TRIG, CHOLHDL, LDLDIRECT in the last 72 hours. Thyroid Function Tests: No results for input(s): TSH, T4TOTAL, FREET4, T3FREE, THYROIDAB in the last 72 hours. Anemia Panel: No results for input(s): VITAMINB12, FOLATE, FERRITIN, TIBC, IRON, RETICCTPCT in the last 72 hours. Urine analysis:    Component Value Date/Time   COLORURINE AMBER (A) 01/02/2017 2214   APPEARANCEUR HAZY (A) 01/02/2017 2214   LABSPEC 1.023 01/02/2017  2214   PHURINE 5.0 01/02/2017 2214   GLUCOSEU NEGATIVE 01/02/2017 2214   GLUCOSEU NEGATIVE 09/10/2016 1002   HGBUR MODERATE (A) 01/02/2017 2214   BILIRUBINUR NEGATIVE 01/02/2017 2214   KETONESUR NEGATIVE 01/02/2017 2214   PROTEINUR 30 (A) 01/02/2017 2214   UROBILINOGEN >=8.0 (A) 09/10/2016 1002   NITRITE NEGATIVE 01/02/2017 2214   LEUKOCYTESUR NEGATIVE 01/02/2017 2214   Sepsis Labs: @LABRCNTIP (procalcitonin:4,lacticidven:4)   Recent Results (from the past 240 hour(s))  Culture, blood (Routine X 2) w Reflex to ID Panel     Status: None   Collection Time: 01/01/17 12:45 PM  Result Value Ref Range Status   Specimen Description BLOOD LEFT ARM  Final   Special Requests IN PEDIATRIC BOTTLE Blood Culture adequate volume  Final   Culture NO GROWTH 5 DAYS  Final   Report Status 01/06/2017 FINAL  Final      Radiology Studies: No results found.  Scheduled Meds: . apixaban  5 mg Oral Q12H  . collagenase   Topical Daily  . doxycycline  100 mg Oral Q12H  . feeding supplement  237 mL Oral BID BM  . furosemide  20 mg Oral Daily  . midodrine  2.5 mg Oral TID WC  . pantoprazole  40 mg Oral BID   Continuous Infusions:   LOS: 43 days    Time spent: 25 minutes  Greater than 50% of the time spent on counseling and coordinating the care.   Manson Passey, MD [facility] Pager [phone]  If 7PM-7AM, please contact night-coverage [email or web address] Password [email or web address] 01/09/2017, 10:55 AM

## 2017-01-10 LAB — CBC
HEMATOCRIT: 31.1 % — AB (ref 39.0–52.0)
Hemoglobin: 9.5 g/dL — ABNORMAL LOW (ref 13.0–17.0)
MCH: 32 pg (ref 26.0–34.0)
MCHC: 30.5 g/dL (ref 30.0–36.0)
MCV: 104.7 fL — ABNORMAL HIGH (ref 78.0–100.0)
Platelets: 254 10*3/uL (ref 150–400)
RBC: 2.97 MIL/uL — ABNORMAL LOW (ref 4.22–5.81)
RDW: 15.8 % — AB (ref 11.5–15.5)
WBC: 5 10*3/uL (ref 4.0–10.5)

## 2017-01-10 NOTE — Progress Notes (Addendum)
Patient ID: Jacob Pittman, male   DOB: July 24, 1945, 71 y.o.   MRN: [id number]  PROGRESS NOTE    Jacob Pittman  YBF:[id number] DOB: 1945/11/20 DOA: 11/26/2016  PCP: [name], MD   Brief Narrative:   71 year old male with ischemic cardiomyopathy status post AICD, hypertension, diabetes, PAD, obstructive sleep apnea, overall poor health who presented to [facility] 11/26/2016 with abdominal pain and was found to have perforated pyloric channel ulcer and pneumoperitonitis. He underwent emergent laparotomy with omental patch repair, postoperatively remained in shock requiring epinephrine and Levothroid drips. His hospital course was complicated by acute ischemia of right arm requiring transfer from [facility] to [facility] and urgent operation, embolectomy. He was intubated and had slow vent weaning. He was transferred from critical care medicine service to the hospitalist service 12/12/2016.  Significant events: 8/12- TTELV moderately dilated with EF 15% &diffuse hypokinesis. There is akinesis of the inferolateral and inferior myocardium. E coli UTI noted 8/13- right brachial A- line placed and then developed ischemic right arm- vascular consult and [facility]- thrombectomy of brachial artery- heparin Paroxysmal A-fib also noted 8/14-Decreased attenuation involving the right dentate nucleus in the right cerebellum and immediately adjacent cerebellar parenchyma, concerning for recent and potentially acute infarct in this area.  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- anticoagulation recommended to be stopped for 10-14 days - H pyloli + started on triple therapy - Transaminitis thought to be du to Diflucan hepatotoxicity 8/17- febrile again and vasopressors resumed 8/23- extubated 8/25A-fib with HR up to 160s, fever 101.6 at 4 AM-care transferred to [facility]   Assessment & Plan:   Perforated pyloric ulcer/H. pylori infection - As  already noted, complicated hospital course - Patient is status post exploratory laparotomy, Graham patch repair 11/27/2016 by Dr. Ezzard Standing - Has developed postoperative abscess - Status post IR drain 12/24/2016 which was subsequently removed 12/30/2016 - Cultures grew multiple organisms, non-predominant - Patient completed course of antibiotics for H. pylori infection  Intra-abdominal abscess - As already noted, polymicrobial cultures, completed antibiotics  Sigmoid colocutaneous fistula - As per surgery, fistula will be left open - Continue Eakin's pouch as managed by wound care - Patient will follow-up with surgery on outpatient basis  MRSE bacteremia - Infectious disease consulted - Central line was out 12/24/2016 - 2-D echo does not mention endocarditis - TEE not done due to patient's multiple comorbidities - Repeat blood cultures were suggestive off but not confirming endocarditis, possible infection of AICD however it was thought that that AICD removal would be a major surgery which patient would probably not tolerate so this was not done - Surveillance cultures on 01/01/2017 were negative - Infectious disease recommended indefinite doxycycline  Septic shock - resolved  Acute on chronic systolic and diastolic CHF - Repeat ECHO showed improved EF 25-30% from 15% with grade 2 DD - No respiratory distress  Paroxysmal atrial fibrillation - CHA2DS2-VASc Score 2  - Patient is in sinus rhythm - Continue apixaban for AC  Bibasilar atelectasis - IS while awake every hour  Hyponatremia - Improved   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 - Continue apixaban   Arterial thrombus right brachial artery - Secondary to A-line, provoked - S/P thrombectomy 11/30/16 - Continue apixaban   Bilateral upper extremity edema - No DVT on UE doppler - Has gotten lasix 10 mg oral dose  - Continue to monitor, it is improving   Fatty liver - Supportive care   Pacer firing - Seen  by cardio for device interrogation  - Stable so far   Cognitive impairment - Stable  Type II DM without complications  - A1c 5.8 - CBG's controlled   Adult failure to thrive - Possible LTACH placement   Dysphagia - Dysphagia 2 diet   Acute urinary retention - Noted on 9/15, foley inserted   Hypokalemia - Due to acute illness - Supplemented   Anemia of chronic illness - Hgb stable   Acute respiratory failure with hypoxia - Intubated and successfully extubated - Stable resp status    DVT prophylaxis: Apixaban  Code Status: DNR/DNI Family Communication: no family at the bedside  Disposition Plan: to [facility] pending insurance approval    Consultants:   General surgery  PCCM  Interventional radiology  Palliative care medicine  Infectious disease   Cardiology  Vascular surgery  Neurology  Procedures:   8/10- ex lap- patch repair of pyloric ucler  8/13- brachial embolectomy  8/12- EF 15%, diffuse hypokinesis and akinesis of inferolateral/inferioir myocardium  9/6- Percutaneous drain  9/7- Echocardiogram: EF of 25-30%, grade 2 diastolic dysfunction. No evidence of vegetations  Antimicrobials:   Doxycycline --> indefinitely    Subjective: No overnight events.  Objective: Vitals:   01/09/17 0500 01/09/17 1550 01/09/17 2025 01/10/17 0512  BP: (!) 91/57 (!) 93/54 (!) 99/52 (!) 90/48  Pulse: 78 76 81 64  Resp: Temp: 98.1 F (36.7 C) 98.3 F (36.8 C) 98.8 F (37.1 C) (!) 97.5 F (36.4 C)  TempSrc: Oral Oral Oral Oral  SpO2: 96% 100% 99% 99%  Weight:      Height:        Intake/Output Summary (Last 24 hours) at 01/10/17 0742 Last data filed at 01/10/17 0600  Gross per 24 hour  Intake              170 ml  Output             1700 ml  Net            -1530 ml   Filed Weights   12/29/16 0300 12/30/16 0400 01/02/17 0349  Weight: 82.5 kg (181 lb 14.1 oz) 84.3 kg (185 lb 13.6 oz) 84.6 kg (186 lb 8.2 oz)    Physical Exam   Constitutional: Appears well-developed and well-nourished. No distress.  CVS: RRR, S1/S2 (+) Pulmonary: Effort and breath sounds normal, no stridor, rhonchi, wheezes, rales.  Abdominal: Soft. BS +,  no distension, tenderness, rebound or guarding.  Musculoskeletal: Normal range of motion. No edema and no tenderness.  Lymphadenopathy: No lymphadenopathy noted, cervical, inguinal. Neuro: Alert. No cranial nerve deficit. Skin: Skin is warm and dry.  Psychiatric: Normal mood and affect. Behavior, judgment, thought content normal.     Data Reviewed: I have personally reviewed following labs and imaging studies  CBC:  Recent Labs Lab 01/04/17 0400 01/10/17 0426  WBC 4.1 5.0  HGB 9.3* 9.5*  HCT 30.0* 31.1*  MCV 104.9* 104.7*  PLT 284 254   Basic Metabolic Panel:  Recent Labs Lab 01/04/17 0400 01/07/17 0457 01/08/17 0520 01/09/17 0703  NA 142 136 135 136  K 3.9 4.4 4.1 4.5  CL 110 104 104 104  CO2 GLUCOSE 81 73 72 78  BUN CREATININE 0.73 0.73 0.75 0.90  CALCIUM 8.2* 8.1* 8.2* 8.2*  MG 1.8  --   --   --    GFR: Estimated Creatinine Clearance: 82.6 mL/min (by C-G formula  based on SCr of 0.9 mg/dL). Liver Function Tests: No results for input(s): AST, ALT, ALKPHOS, BILITOT, PROT, ALBUMIN in the last 168 hours. No results for input(s): LIPASE, AMYLASE in the last 168 hours. No results for input(s): AMMONIA in the last 168 hours. Coagulation Profile: No results for input(s): INR, PROTIME in the last 168 hours. Cardiac Enzymes: No results for input(s): CKTOTAL, CKMB, CKMBINDEX, TROPONINI in the last 168 hours. BNP (last 3 results) No results for input(s): PROBNP in the last 8760 hours. HbA1C: No results for input(s): HGBA1C in the last 72 hours. CBG:  Recent Labs Lab 01/06/17 0310 01/06/17 0746 01/06/17 1214 01/06/17 1604 01/06/17 1929  GLUCAP 65 73 98 87 91   Lipid Profile: No results for input(s): CHOL, HDL, LDLCALC, TRIG, CHOLHDL,  LDLDIRECT in the last 72 hours. Thyroid Function Tests: No results for input(s): TSH, T4TOTAL, FREET4, T3FREE, THYROIDAB in the last 72 hours. Anemia Panel: No results for input(s): VITAMINB12, FOLATE, FERRITIN, TIBC, IRON, RETICCTPCT in the last 72 hours. Urine analysis:    Component Value Date/Time   COLORURINE AMBER (A) 01/02/2017 2214   APPEARANCEUR HAZY (A) 01/02/2017 2214   LABSPEC 1.023 01/02/2017 2214   PHURINE 5.0 01/02/2017 2214   GLUCOSEU NEGATIVE 01/02/2017 2214   GLUCOSEU NEGATIVE 09/10/2016 1002   HGBUR MODERATE (A) 01/02/2017 2214   BILIRUBINUR NEGATIVE 01/02/2017 2214   KETONESUR NEGATIVE 01/02/2017 2214   PROTEINUR 30 (A) 01/02/2017 2214   UROBILINOGEN >=8.0 (A) 09/10/2016 1002   NITRITE NEGATIVE 01/02/2017 2214   LEUKOCYTESUR NEGATIVE 01/02/2017 2214   Sepsis Labs: (procalcitonin:4,lacticidven:4)   Recent Results (from the past 240 hour(s))  Culture, blood (Routine X 2) w Reflex to ID Panel     Status: None   Collection Time: 01/01/17 12:45 PM  Result Value Ref Range Status   Specimen Description BLOOD LEFT ARM  Final   Special Requests IN PEDIATRIC BOTTLE Blood Culture adequate volume  Final   Culture NO GROWTH 5 DAYS  Final   Report Status 01/06/2017 FINAL  Final      Radiology Studies: No results found.  Scheduled Meds: . apixaban  5 mg Oral Q12H  . collagenase   Topical Daily  . doxycycline  100 mg Oral Q12H  . feeding supplement  237 mL Oral BID BM  . furosemide  20 mg Oral Daily  . midodrine  2.5 mg Oral TID WC  . pantoprazole  40 mg Oral BID   Continuous Infusions:   LOS: 44 days    Time spent: 25 minutes  Greater than 50% of the time spent on counseling and coordinating the care.   Manson Passey, MD [facility] Pager [phone]  If 7PM-7AM, please contact night-coverage [email or web address] Password [email or web address] 01/10/2017, 7:42 AM

## 2017-01-11 LAB — CBC
HEMATOCRIT: 31.1 % — AB (ref 39.0–52.0)
Hemoglobin: 9.8 g/dL — ABNORMAL LOW (ref 13.0–17.0)
MCH: 33 pg (ref 26.0–34.0)
MCHC: 31.5 g/dL (ref 30.0–36.0)
MCV: 104.7 fL — ABNORMAL HIGH (ref 78.0–100.0)
Platelets: 248 10*3/uL (ref 150–400)
RBC: 2.97 MIL/uL — ABNORMAL LOW (ref 4.22–5.81)
RDW: 16.2 % — AB (ref 11.5–15.5)
WBC: 4.7 10*3/uL (ref 4.0–10.5)

## 2017-01-11 LAB — BASIC METABOLIC PANEL
Anion gap: 8 (ref 5–15)
BUN: 10 mg/dL (ref 6–20)
CALCIUM: 8.1 mg/dL — AB (ref 8.9–10.3)
CO2: 24 mmol/L (ref 22–32)
CREATININE: 0.8 mg/dL (ref 0.61–1.24)
Chloride: 104 mmol/L (ref 101–111)
GFR calc non Af Amer: >60 mL/min (ref >60)
GLUCOSE: 72 mg/dL (ref 65–99)
Potassium: 3.9 mmol/L (ref 3.5–5.1)
Sodium: 136 mmol/L (ref 135–145)

## 2017-01-11 MED ORDER — LACTATED RINGERS IV BOLUS (SEPSIS)
500.0000 mL | Freq: Once | INTRAVENOUS | Status: AC
  Administered 2017-01-11: 500 mL via INTRAVENOUS

## 2017-01-11 MED ORDER — DICLOFENAC SODIUM 1 % TD GEL
2.0000 g | Freq: Four times a day (QID) | TRANSDERMAL | Status: DC
  Administered 2017-01-11 – 2017-01-13 (×4): 2 g via TOPICAL
  Filled 2017-01-11: qty 100

## 2017-01-11 MED ORDER — ACETAMINOPHEN 500 MG PO TABS
1000.0000 mg | ORAL_TABLET | Freq: Three times a day (TID) | ORAL | Status: DC
  Administered 2017-01-11 – 2017-01-13 (×5): 1000 mg via ORAL
  Filled 2017-01-11 (×5): qty 2

## 2017-01-11 MED ORDER — PRO-STAT SUGAR FREE PO LIQD
30.0000 mL | Freq: Two times a day (BID) | ORAL | Status: DC
  Administered 2017-01-11 – 2017-01-13 (×3): 30 mL via ORAL
  Filled 2017-01-11 (×4): qty 30

## 2017-01-11 MED ORDER — ADULT MULTIVITAMIN W/MINERALS CH
1.0000 | ORAL_TABLET | Freq: Every day | ORAL | Status: DC
  Administered 2017-01-11 – 2017-01-13 (×3): 1 via ORAL
  Filled 2017-01-11 (×3): qty 1

## 2017-01-11 NOTE — Progress Notes (Signed)
Wound care was performed, pt was repositioned.

## 2017-01-11 NOTE — Progress Notes (Signed)
Nutrition Follow-up  DOCUMENTATION CODES:   Obesity unspecified  INTERVENTION:   -Continue Ensure Enlive po BID, each supplement provides 350 kcal and 20 grams of protein -30 ml Prostat BID, each supplement provides 100 kcals and 15 grams of protein -MVI daily  NUTRITION DIAGNOSIS:   Increased nutrient needs related to wound healing as evidenced by estimated needs.  Ongoing  GOAL:   Patient will meet greater than or equal to 90% of their needs  Progressing   MONITOR:   PO intake, Supplement acceptance, Labs, Weight trends, Skin, I & O's  REASON FOR ASSESSMENT:   Consult New TPN/TNA  ASSESSMENT:   71 y.o.M with CHF (EF 15%), HTN, DM, PAD, and OA. Pt admitted with peritonitis and septic shock secondary to perforated ulcer s/p exp lap oversew pyloric channel ulcer, graham omental patch repair for perforated ulcer. Pt transferred to [place] for brachial embolectomy 8/13.  Spoke with pt, who reports fair appetite. He shares he ate about 25% of his breakfast tray- consuming 100% of one food item. Pt shares that his family also being a variety of foods from home that he enjoys eating- noted a bottle of lemonade on countertop. Pt is consuming Ensure supplements- noted he consumed about 25% of AM supplement. He estimates he drinks about 2 Ensures daily.   Reviewed wt hx, which reveals a 14.4% wt loss over the past month. However, wt has been stable over the past 3 weeks, so suspect wt loss may be related to fluid changes from acute illness.   Nutrition-Focused physical exam completed. Findings are no fat depletion, moderate to severe muscle depletion, and mild edema.   Discussed with pt importance of consuming food and supplements to optimize nutrition to support wound healing. Per [facility] notes, pt has made significant progress and would be best served in an [facility] setting given complexities with wound healing.   Labs reviewed.  Diet Order:  DIET DYS 3 Room service  appropriate? Yes; Fluid consistency: Thin  Skin:  Wound (see comment) (non-pressure abdomen, UN rt/lt heel, coccyx)  Last BM:  9/24 (10 ml output via colostomy)  Height:   Ht Readings from Last 1 Encounters:  12/21/16 6\' 1"  (1.854 m)    Weight:   Wt Readings from Last 1 Encounters:  01/02/17 186 lb 8.2 oz (84.6 kg)    Ideal Body Weight:  70 kg  BMI:  Body mass index is 24.61 kg/m.  Estimated Nutritional Needs:   Kcal:  2250-2450  Protein:  120-135 grams  Fluid:  > 2.2 L  EDUCATION NEEDS:   Education needs addressed  Lucile Didonato A. Mayford Knife, RD, LDN, CDE Pager: [phone] After hours Pager: [phone]

## 2017-01-11 NOTE — Progress Notes (Addendum)
Patient ID: Jacob Pittman, male   DOB: January 18, 1946, 71 y.o.   MRN: [id number]  PROGRESS NOTE    Jacob Pittman  VTX:[id number] DOB: [birth year] DOA: 11/26/2016  PCP: Jacob Levins, MD   Brief Narrative:  71 year old male with ischemic cardiomyopathy status post AICD, hypertension, diabetes, PAD, obstructive sleep apnea, overall poor health who presented to [facility] 11/26/2016 with abdominal pain and was found to have perforated pyloric channel ulcer and pneumoperitonitis. He underwent emergent laparotomy with omental patch repair, postoperatively remained in shock requiring epinephrine and Levothroid drips. His hospital course was complicated by acute ischemia of right arm requiring transfer from [facility] to [facility] and urgent operation, embolectomy. He was intubated and had slow vent weaning. He was transferred from critical care medicine service to the hospitalist service 12/12/2016.  Significant events: 8/12- TTELV moderately dilated with EF 15% &diffuse hypokinesis. There is akinesis of the inferolateral and inferior myocardium. E coli UTI noted 8/13- right brachial A- line placed and then developed ischemic right arm- vascular consult and transfer from [facility] to [facility]- thrombectomy of brachial artery- heparin Paroxysmal A-fib also noted 8/14-Decreased attenuation involving the right dentate nucleus in the right cerebellum and immediately adjacent cerebellar parenchyma, concerning for recent and potentially acute infarct in this area.  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- anticoagulation recommended to be stopped for 10-14 days - H pyloli + started on triple therapy - Transaminitis thought to be du to Diflucan hepatotoxicity 8/17- febrile again and vasopressors resumed 8/23- extubated 8/25A-fib with HR up to 160s, fever 101.6 at 4 AM-care transferred to [facility]  Assessment & Plan:   Perforated pyloric ulcer/H. pylori infection /  Intra-abdominal abscess  - Complicated hospital course - S/P exploratory laparotomy, graham patch repair 11/27/2016 by Dr. Ezzard Standing - Has developed postoperative abscess - S/P IR drain 12/24/2016 which was removed 12/30/2016 - Cultures grew multiple organisms  - Completed course of abx for H/Pylori infection   Sigmoid colocutaneous fistula - As per surgery, fistula will be left open - Continue Eakin's pouch as managed by wound care - Outpatient follow-up with surgery  MRSE bacteremia - Central line discontinued 12/24/2016 - TTE does not mention endocarditis - TEE not done due to patient's multiple comorbidities - Patient's blood cultures were suggestive of but not confirming endocarditis, possible infection of AICD but he was thought that removal of a SCD is a major surgery which patient would probably not tolerate so this was not done - Surveillance cultures on 01/01/2017 were negative - Per infectious disease, recommended indefinite doxycycline  Septic shock - Resolved   Acute on chronic systolic and diastolic CHF - Repeat ECHO showed improved EF 25-30% from 15% with grade 2 DD - No respiratory distress   Paroxysmal atrial fibrillation - CHA2DS2-VASc Score 2  - In sinus rhythm - Continue apixaban for anticoagulation   Bibasilar atelectasis - IS while awake as needed  Hyponatremia - Improved   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 - Continue apixaban  Arterial thrombus right brachial artery - Secondary to A-line, provoked - S/P thrombectomy 11/30/16 - Continue apixaban  Bilateral upper extremity edema - No DVT on UE doppler - Has gotten lasix 10 mg oral dose  - Improving   Fatty liver - Supportive care   Pacer firing - Seen by cardio for device interrogation  - Stable   Cognitive impairment - Stable   Type II DM without complications  - A1c 5.8 - CBG's controlled   Adult failure to  thrive - In the context of prolonged illness   Dysphagia -  Dysphagia 2 diet  Acute urinary retention - Noted on 9/15, foley inserted   Hypokalemia - Due to acute illness - Supplemented and WNL  Anemia of chronic illness - Hgb stable   Acute respiratory failure with hypoxia - Intubated and successfully extubated - Stable respiratory status    DVT prophylaxis: Apixaban Code Status: DNR/DNI Family Communication: no family at the bedside; called pt daughter, gave update  Disposition Plan: LTACH pending insurance approval    Consultants:   General surgery  PCCM  Interventional radiology  Palliative care medicine  Infectious disease   Cardiology  Vascular surgery  Neurology  Procedures:   8/10- ex lap- patch repair of pyloric ucler  8/13- brachial embolectomy  8/12- EF 15%, diffuse hypokinesis and akinesis of inferolateral/inferioir myocardium  9/6- Percutaneous drain  9/7- Echocardiogram: EF of 25-30%, grade 2 diastolic dysfunction. No evidence of vegetations  Antimicrobials:   Doxycycline -->   Subjective: No overnight events.  Objective: Vitals:   01/10/17 0512 01/10/17 1300 01/10/17 2038 01/11/17 0500  BP: (!) 90/48 (!) 89/60 (!) 92/48 (!) 103/55  Pulse: 64 76 76 74  Resp: Temp: (!) 97.5 F (36.4 C) (!) 97.5 F (36.4 C) 98.1 F (36.7 C) 98.1 F (36.7 C)  TempSrc: Oral Oral Oral Oral  SpO2: 99% 98% 100% 100%  Weight:      Height:        Intake/Output Summary (Last 24 hours) at 01/11/17 0829 Last data filed at 01/11/17 1191  Gross per 24 hour  Intake                0 ml  Output             1810 ml  Net            -1810 ml   Filed Weights   12/29/16 0300 12/30/16 0400 01/02/17 0349  Weight: 82.5 kg (181 lb 14.1 oz) 84.3 kg (185 lb 13.6 oz) 84.6 kg (186 lb 8.2 oz)    Examination:  General exam: Appears calm and comfortable  Respiratory system: Clear to auscultation. Respiratory effort normal. Cardiovascular system: S1 & S2 heard, rate controlled    Gastrointestinal  system: Has abd binder, non tender abdomen  Central nervous system: Alert and oriented. No focal neurological deficits. Extremities: Symmetric 5 x 5 power. Skin: No rashes, lesions or ulcers Psychiatry: Judgement and insight appear normal. Mood & affect appropriate.   Data Reviewed: I have personally reviewed following labs and imaging studies  CBC:  Recent Labs Lab 01/10/17 0426 01/11/17 0533  WBC 5.0 4.7  HGB 9.5* 9.8*  HCT 31.1* 31.1*  MCV 104.7* 104.7*  PLT 254 248   Basic Metabolic Panel:  Recent Labs Lab 01/07/17 0457 01/08/17 0520 01/09/17 0703 01/11/17 0533  NA 136 135 136 136  K 4.4 4.1 4.5 3.9  CL 104 104 104 104  CO2 GLUCOSE 73 72 78 72  BUN CREATININE 0.73 0.75 0.90 0.80  CALCIUM 8.1* 8.2* 8.2* 8.1*   GFR: Estimated Creatinine Clearance: 92.9 mL/min (by C-G formula based on SCr of 0.8 mg/dL). Liver Function Tests: No results for input(s): AST, ALT, ALKPHOS, BILITOT, PROT, ALBUMIN in the last 168 hours. No results for input(s): LIPASE, AMYLASE in the last 168 hours. No results for input(s): AMMONIA in the last 168 hours. Coagulation Profile: No results for  input(s): INR, PROTIME in the last 168 hours. Cardiac Enzymes: No results for input(s): CKTOTAL, CKMB, CKMBINDEX, TROPONINI in the last 168 hours. BNP (last 3 results) No results for input(s): PROBNP in the last 8760 hours. HbA1C: No results for input(s): HGBA1C in the last 72 hours. CBG:  Recent Labs Lab 01/06/17 0310 01/06/17 0746 01/06/17 1214 01/06/17 1604 01/06/17 1929  GLUCAP 65 73 98 87 91   Lipid Profile: No results for input(s): CHOL, HDL, LDLCALC, TRIG, CHOLHDL, LDLDIRECT in the last 72 hours. Thyroid Function Tests: No results for input(s): TSH, T4TOTAL, FREET4, T3FREE, THYROIDAB in the last 72 hours. Anemia Panel: No results for input(s): VITAMINB12, FOLATE, FERRITIN, TIBC, IRON, RETICCTPCT in the last 72 hours. Urine analysis:    Component Value  Date/Time   COLORURINE AMBER (A) 01/02/2017 2214   APPEARANCEUR HAZY (A) 01/02/2017 2214   LABSPEC 1.023 01/02/2017 2214   PHURINE 5.0 01/02/2017 2214   GLUCOSEU NEGATIVE 01/02/2017 2214   GLUCOSEU NEGATIVE 09/10/2016 1002   HGBUR MODERATE (A) 01/02/2017 2214   BILIRUBINUR NEGATIVE 01/02/2017 2214   KETONESUR NEGATIVE 01/02/2017 2214   PROTEINUR 30 (A) 01/02/2017 2214   UROBILINOGEN >=8.0 (A) 09/10/2016 1002   NITRITE NEGATIVE 01/02/2017 2214   LEUKOCYTESUR NEGATIVE 01/02/2017 2214   Sepsis Labs: (procalcitonin:4,lacticidven:4)    Recent Results (from the past 240 hour(s))  Culture, blood (Routine X 2) w Reflex to ID Panel     Status: None   Collection Time: 01/01/17 12:45 PM  Result Value Ref Range Status   Specimen Description BLOOD LEFT ARM  Final   Special Requests IN PEDIATRIC BOTTLE Blood Culture adequate volume  Final   Culture NO GROWTH 5 DAYS  Final   Report Status 01/06/2017 FINAL  Final      Radiology Studies: No results found.    Scheduled Meds: . apixaban  5 mg Oral Q12H  . collagenase   Topical Daily  . doxycycline  100 mg Oral Q12H  . feeding supplement   237 mL Oral BID BM  . furosemide  20 mg Oral Daily  . midodrine  2.5 mg Oral TID WC  . pantoprazole  40 mg Oral BID   Continuous Infusions:   LOS: 45 days    Time spent: 25 minutes  Greater than 50% of the time spent on counseling and coordinating the care.   Manson Passey, MD [facility] Pager [phone]  If 7PM-7AM, please contact night-coverage [email or web address] Password [facility] 01/11/2017, 8:29 AM

## 2017-01-11 NOTE — Consult Note (Signed)
WOC Nurse wound follow up Wound type:  Abdominal surgical wound with EC fistula at the distal edge of wound Unstageable pressure injury: sacrum;HAPI Unstageable pressure injury: left heel; HAPI Unstageable pressure injury: right ischium; HAPI Deep tissue injury: right great toe metatarsal head medial; HAPI  Measurement:  Abdominal wound: 10cm x 3cm; feculent material from distal edges of wound noted in Eakin pouch (see documentation of amounts of output) Sacrum: 5cm x 4cm x 0.1cm; 75% adherent yellow/black soft eschar/25% pink at wound edges; minimal serousanginous Right  ischium: 1.5cm x 1cm x 0.1cm; 50% pink/50 % yellow softening with use of enzyme; no drainage Left heel: 1.0cm x 1.0cm x 0.1cm; dry, resolving; no drainage Right great toe: 0.2cm x 0.2cm x 0 cm; dark purple intact skin  Wound bed: Abdominal Eakin to be changed at visit 01/12/17 See other above Drainage (amount, consistency, odor) see above Periwound:intact  Dressing procedure/placement/frequency: 1. Prevalon boots and silicone foam to the left heel pressure injury; Prevalon boot to the right for vunerable heel 2. Continue enzymatic debridement to the sacral wound daily, if necrotic tissue loosens will add hydrotherapy to clean wound 3. Silicone foam to the right ischial wound 4. Low air loss mattress for pressure redistribution and moisture management. 5. Moisture barrier cream to buttocks and scrotum, patient is incontinent of stool 6. Indwelling urinary catheter to manage urinary incontinence, could transition to condom cath unless urinary retention present 7. Maximize nutrition for wound healing, will add RD for wound healing supplementation, since patient is eating and fistula has not been treated with bowel rest.   WOC Nurse team will follow along with you for weekly wound assessments and fistula management.  Please notify me of any acute changes in the wounds or any new areas of concerns Esli Jernigan [facility] MSN,  RN,CWOCN, CNS, CWON-AP [phone]

## 2017-01-11 NOTE — Progress Notes (Signed)
Awaiting call from [facility] Physician/Director. This patient is most appropriate for LTACH level care based on the complexity of his abdominal wound and sacral/heel decubitus ulcers that need specialized care. Based on the location of the colonic-cutaneous fistula stool likely to contaminate wound bed and he remains a high risk for sepsis until additional healing occurs and will require careful daily wound management including care for the Eakins pouch. Jacob Pittman has made dramatic improvement and is very motivated towards recovery- he was independent prior to this acute perforation and his goals are to receive full scope medical treatment and rehabilitate back to a meaningful QOL- while palliative care saw him during his serious illness- we helped to establish goals and are supporting his recovery if this is possible- he also has been clear about his desire to not be put back on a ventilator or suffer unecessarily if he declines again or he does not continue to improve. His best chance for recovery is LTACH with transition into post-acute rehab based on his complex wound care needs and high risk for sepsis.  Anderson Malta, DO Palliative Medicine [phone]

## 2017-01-12 DIAGNOSIS — G6281 Critical illness polyneuropathy: Secondary | ICD-10-CM

## 2017-01-12 DIAGNOSIS — R7881 Bacteremia: Secondary | ICD-10-CM

## 2017-01-12 DIAGNOSIS — R509 Fever, unspecified: Secondary | ICD-10-CM

## 2017-01-12 DIAGNOSIS — G9340 Encephalopathy, unspecified: Secondary | ICD-10-CM

## 2017-01-12 DIAGNOSIS — K651 Peritoneal abscess: Secondary | ICD-10-CM

## 2017-01-12 DIAGNOSIS — F22 Delusional disorders: Secondary | ICD-10-CM

## 2017-01-12 DIAGNOSIS — T07XXXA Unspecified multiple injuries, initial encounter: Secondary | ICD-10-CM

## 2017-01-12 NOTE — Progress Notes (Addendum)
Daily Progress Note   Patient Name: Jacob Pittman       Date: 01/12/2017 DOB: 29-Jun-1945  Age: 71 y.o. MRN#: [id number] Attending Physician: Jacob Lis, MD Primary Care Physician: Jacob Borg, MD Admit Date: 11/26/2016  Reason for Consultation/Follow-up: Disposition, Non pain symptom management, Pain control, Psychosocial/spiritual support and Other  Subjective: [name] tells me he is "still getting better". His daughter is at bedside this evening she is concerned about his mental status. He is interactive, but endorses "seeing things", mild paranoia -has "dreams that scare him" and thinks someone is in the room when no one is there. This is new. He is still alert and oriented and his affect is not depressed. He complains of new pain in his right wrist- painful even to very light touch-slightly swollen and red. He tells me he is tired of lab draws and "being stuck". He want"get out of this place". His goal is to go home eventually.  Length of Stay: 46  Current Medications: Scheduled Meds:  . acetaminophen  1,000 mg Oral TID  . apixaban  5 mg Oral Q12H  . chlorhexidine gluconate (MEDLINE KIT)  15 mL Mouth Rinse BID  . collagenase   Topical Daily  . diclofenac sodium  2 g Topical QID  . doxycycline  100 mg Oral Q12H  . feeding supplement (ENSURE ENLIVE)  237 mL Oral BID BM  . feeding supplement (PRO-STAT SUGAR FREE 64)  30 mL Oral BID  . furosemide  20 mg Oral Daily  . midodrine  2.5 mg Oral TID WC  . multivitamin with minerals  1 tablet Oral Daily  . pantoprazole  40 mg Oral BID  . sodium chloride flush  5 mL Intravenous Q8H    Continuous Infusions:   PRN Meds: acetaminophen (TYLENOL) oral liquid 160 mg/5 mL, artificial tears, docusate sodium, ondansetron **OR**  ondansetron (ZOFRAN) IV  Physical Exam          Vital Signs: BP (!) 101/52 (BP Location: Left Arm)   Pulse 70   Temp 98.4 F (36.9 C) (Oral)   Resp 18   Ht '6\' 1"'$  (1.854 m)   Wt 84.6 kg (186 lb 8.2 oz)   SpO2 100%   BMI 24.61 kg/m  SpO2: SpO2: 100 % O2 Device: O2 Device: Not Delivered O2 Flow Rate:  O2 Flow Rate (L/min): 2 L/min  Intake/output summary:  Intake/Output Summary (Last 24 hours) at 01/12/17 0645 Last data filed at 01/12/17 7004  Gross per 24 hour  Intake              600 ml  Output             1200 ml  Net             -600 ml   LBM: Last BM Date: 01/09/17 Baseline Weight: Weight: 90.7 kg (200 lb) Most recent weight: Weight: 84.6 kg (186 lb 8.2 oz)       Patient Active Problem List   Diagnosis Date Noted  . Coag negative Staphylococcus bacteremia   . Bowel perforation (HCC)   . Sepsis (HCC)   . Palliative care encounter   . Goals of care, counseling/discussion   . Poor venous access   . Pressure sore on heel, left, unstageable (HCC) 12/16/2016  . Paroxysmal A-fib (HCC) 12/12/2016  . CVA (cerebral vascular accident) (HCC) 12/12/2016  . 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 H pylori ulcer 12/12/2016  . Fatty infiltration of liver 12/12/2016  . DM (diabetes mellitus), type 2 (HCC) 12/12/2016  . Acute respiratory failure with hypoxia (HCC)   . Pneumoperitoneum 11/27/2016  . Perforated duodenal bulb ulcer (HCC) 11/27/2016  . Elevated PSA 02/07/2016  . Contact dermatitis 07/23/2015  . History of colonic polyps 07/15/2015  . Venous stasis ulcer of left lower extremity (HCC) 01/21/2015  . Left arm swelling 01/03/2015  . Dyspnea 01/03/2015  . Irregular heart beats 01/03/2015  . Wheezing 07/19/2014  . Peripheral arterial disease (HCC) 10/31/2013  . Conjunctivitis 09/22/2013  . Allergic rhinitis 09/22/2013  . ICD (implantable cardioverter-defibrillator), single, in situ 07/12/2013  . Chronic combined systolic and diastolic heart failure, NYHA class 3 (HCC)  04/10/2013  . Nonischemic cardiomyopathy (HCC)   . Acute on chronic combined systolic and diastolic CHF (congestive heart failure) (HCC) 09/30/2012  . History of alcohol use 09/30/2012  . Back pain 10/08/2011  . 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 Preventative health care 08/17/2010  . HIP PAIN, RIGHT 11/06/2009  . ANXIETY 02/04/2009  . Diabetes (HCC) 11/07/2008  . Hyperlipidemia 11/07/2008  . 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 Essential hypertension 10/08/2008  . OSTEOARTHRITIS, KNEE, RIGHT 10/08/2008  . FATIGUE 10/08/2008    Palliative Care Assessment & Plan   Patient Profile: 71 yo man with prolonged illness and complicated hospitalization related to a perforated gastric ulcer. While he has made stead improvement he has significant challenges ahead - I confirmed this evening that his goals are to continue to push forward. We discussed his complex wounds and need for ongoing skilled care and rehabilitation.  Assessment:  1. Complex Wounds: Abdominal wound/Colonic-cutaneous fistula:   See WOC note, currently covered with an Eakin's pouch that is connected to a bag to gravity- is thick green drainage and discharge in the bag.  HE REMAINS HIGH RISK FOR SEPSIS given fecal contents draining near wound bed. Needs output monitoring.  Per nursing flowsheet documentation dehisced abdominal wound is requiring TID dressing changes just above the Eakin's pouch, due to copious amounts of drainage.  Possible options for ostomy formation if wound heals and his overall condition improves-he is too high risk for complicated surgery or other aggressive interventions. Sacral decubitus and Bilateral Heel decubitus:  Requiring daily frequent wound care- enzymatic debridement- followed by hydrotherapy planned  Rotating bed required - pressure redistribution and moisture management  Bilateral prevalon boots  2. Nutrition and Volume Status- HE APPEARS DEHYDRATED  Patient is on  midodrine TID, but remains hypotensive- SCr is normal  On oral Lasix, diuresis ongoing since hospitalized but now appears volume depleted clinically  Net negative over 3L in last 24 hours and urine output dropped to only 647m for last 24 hours and is very concentrated.  Suspect he cannot keep up with his oral fluids-he needs assistance with this  Since he is having signs of delirium and mental status changes- along with significant hypotension I will do a fluid bolus challenge and see if this helps him- 5016mLR IV Bolus now. Hold Lasix for now. Strict I&O.  Full assist with all meals- offer fluids q2 while awake.  Patient will need attention to his I/O in setting of known heart failure- he is pre-load dependent and hypotensive.  Has been having issues with hypoglycemia related to intake. Monitoring.  3. Pain/Immobility:  Possibly early joint contractures- painful left wrist - inflammatory pain-will need to monitor  Schedule Tylenol TID and since his renal function is WNL will use topical Voltaren gel with low systemic absorption to avoid GI side effects.  If his right wrist joint remains red/swollen and tender this may need to be evaluated  He is 2 person assist OOB, has tried to take a step or two with PT according to him- very motivated.  4. Indwelling Foley:  Discussed leaving this in place while sacral wound is being treated-he has history of elevated PSA-he is high risk for r tension in setting of prolonged foley catheter. Will need bladder training eventually. Previously had acute urinary retension.   Care discussed with hospitalist, RN and RNCM.  Working on LTNIKElacement- appeal in progress. Acuity too high for SNF-but has good potential for improvement. Main needs are extensive wound management, FEN monitoring and continued rehabilitation-he is severely deconditioned.   His acute medical issues including Right Arm pain swelling inflamed joint and issues with thrombus  earlier in hospital stay, CHF Volume Management, and Complex Wounds are interfering with his ability to rehab- will have PT continue to see, slow progression seen. Doubt he could tolerate 3 hours of therapy daily, but will have formal PT and OT eval.  Extensive wound care with potential for additional or future surgeries needed.  Ongoing Dysphagia management- doing well- difficulty feeding himself and taking fluid independently due to UE stiffness- he needs full supervision.   Thank you for allowing the Palliative Medicine Team to assist in the care of this patient.   Time: 35 min    Greater than 50%  of this time was spent counseling and coordinating care related to the above assessment and plan.  GOLDING,ELIZABETH, DO  Please contact Palliative Medicine Team phone at [phone] questions and concerns.

## 2017-01-12 NOTE — Consult Note (Signed)
Physical Medicine and Rehabilitation Consult   Reason for Consult: Debility due to multiple medical issues Referring Physician:  Dr. Hilma Favors.    HPI: Jacob Pittman is a 71 y.o. male with history of NICM s/p ICD, T2DM who was admitted on 11/26/2016 perforated duodenal ulcer with peritonitis requiring exploratory lap with repair of perforated ulcer on 8/10 by Dr. Lucia Gaskins.  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  Has poor po intake with multiple areas of breakdown, issues with paranoia, cognitive deficits affecting tasks, as well as severe debility. MD recommending CIR due to deficit in mobility and ability to carry out ADL tasks.    Review of Systems  Constitutional: Negative for chills and fever.  HENT: Negative for hearing loss and tinnitus.   Eyes: Negative for blurred vision and double vision.  Respiratory: Negative for cough and shortness of breath.   Gastrointestinal: Negative for abdominal pain, heartburn and nausea.  Genitourinary: Negative for dysuria.  Musculoskeletal: Positive for joint pain. Negative for myalgias.  Skin: Negative for rash.  Neurological: Positive for sensory change, focal weakness (BUE) and weakness.  Psychiatric/Behavioral: Positive for memory loss.  All other systems reviewed and are negative.   Past Medical History:  Diagnosis Date  . ANXIETY 02/04/2009  . CHF (congestive heart failure) ([place])   . Chronic systolic CHF (congestive heart failure) ([place])   . DIABETES MELLITUS, TYPE II 11/07/2008  . 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 HIP PAIN, RIGHT 11/06/2009  . HYPERLIPIDEMIA 11/07/2008  . HYPERTENSION 10/08/2008  . Nonischemic cardiomyopathy [facility])    s/p ICD  implantation 03-2013 by Dr Lovena Le  . OSTEOARTHRITIS, KNEE, RIGHT 10/08/2008  . Peripheral arterial disease [facility])     Past Surgical History:  Procedure Laterality Date  . CARDIAC DEFIBRILLATOR PLACEMENT Left 04/10/13   BTK single chamber ICD implanted by Dr Lovena Le for primary prevention  . EMBOLECTOMY Right 11/30/2016   Procedure: BRACHIAL EMBOLECTOMY WITH VEIN PATCH ANGIOPLASTY;  Surgeon: Elam Dutch, MD;  Location: [facility];  Service: Vascular;  Laterality: Right;  . IMPLANTABLE CARDIOVERTER DEFIBRILLATOR IMPLANT N/A 04/10/2013   Procedure: IMPLANTABLE CARDIOVERTER DEFIBRILLATOR IMPLANT;  Surgeon: Evans Lance, MD;  Location: [facility] CATH LAB;  Service: Cardiovascular;  Laterality: N/A;  . LAPAROTOMY N/A 11/27/2016   Procedure: EXPLORATORY LAPAROTOMY abdominal exploration oversew pyloric channel ulcer gram patch repair perforated ulcer;  Surgeon: Alphonsa Overall, MD;  Location: [facility] ORS;  Service: General;  Laterality: N/A;  . s/p left broken arm with pin     1980's    Family History  Problem Relation Age of Onset  . Diabetes Mother   . Hypertension Mother     Social History:  Married but separated "on and off". Wife on dialysis. Daughter helps out patient and wife with home management and meals? Per reports that he has quit smoking. He has never used smokeless tobacco. He reports that he drinks alcohol--> a pint daily.  He reports that he does not use drugs.    Allergies: No Known Allergies    Medications Prior to Admission  Medication Sig Dispense Refill  . aspirin 81 MG EC tablet Take 81 mg by mouth daily.      . carvedilol (COREG) 12.5 MG tablet TAKE ONE TABLET BY MOUTH TWICE DAILY 60  tablet 2  . fluticasone (FLONASE) 50 MCG/ACT nasal spray Place 2 sprays into both nostrils daily. 16 g 2  . furosemide (LASIX) 40 MG tablet Take 40 mg by mouth daily.    . metFORMIN (GLUCOPHAGE-XR) 500 MG 24 hr tablet TAKE TWO TABLETS BY MOUTH ONCE DAILY IN THE MORNING 180 tablet 0  . metolazone  (ZAROXOLYN) 2.5 MG tablet TAKE ONE TABLET BY MOUTH EVERY OTHER DAY 15 tablet 11  . rosuvastatin (CRESTOR) 20 MG tablet Take 1 tablet (20 mg total) by mouth daily. 90 tablet 3  . albuterol (PROVENTIL HFA;VENTOLIN HFA) 108 (90 BASE) MCG/ACT inhaler Inhale 2 puffs into the lungs every 6 (six) hours as needed for wheezing or shortness of breath. 1 Inhaler 5  . Blood Glucose Monitoring Suppl (ONE TOUCH ULTRA SYSTEM KIT) w/Device KIT Use as directed daily 1 each 0  . cetirizine (ZYRTEC) 10 MG tablet Take 1 tablet (10 mg total) by mouth daily. (Patient not taking: Reported on 11/26/2016) 30 tablet 11  . furosemide (LASIX) 40 MG tablet Take 1 tablet (40 mg total) by mouth daily. (Patient not taking: Reported on 11/26/2016) 30 tablet 6  . glucose blood test strip Use as instructed 100 each 12  . Lancets MISC Use as directed once daily 100 each 12  . sildenafil (REVATIO) 20 MG tablet Take 1 tablet (20 mg total) by mouth as directed. Take 2-4 tablets by mouth as needed (Patient taking differently: Take 40 mg by mouth daily as needed (ed). ) 40 tablet 1  . silver sulfADIAZINE (SILVADENE) 1 % cream Apply 1 application topically daily. (Patient not taking: Reported on 11/26/2016) 50 g 0  . spironolactone (ALDACTONE) 25 MG tablet Take 0.5 tablets (12.5 mg total) by mouth daily. (Patient not taking: Reported on 11/26/2016) 30 tablet 0  . Tadalafil 2.5 MG TABS Take 1 tablet (2.5 mg total) by mouth daily as needed. (Patient taking differently: Take 2.5 mg by mouth daily as needed (erectile dysfunction). ) 20 each 0    Home: Home Living Family/patient expects to be discharged to:: Private residence Living Arrangements: Alone Available Help at Discharge: Family (Per chart, daughter will be with patient - ? hours) Additional Comments: Patient unable to provide information.  Functional History: Prior Function Level of Independence: Independent Comments: Unsure of reliability of information Functional Status:   Mobility: Bed Mobility Overal bed mobility: Needs Assistance Bed Mobility: Supine to Sit Rolling: Max assist, +2 for physical assistance Sidelying to sit: Max assist, +2 for physical assistance, HOB elevated Supine to sit: Mod assist Sit to supine: Max assist, +2 for physical assistance Sit to sidelying: Max assist, +2 for physical assistance, HOB elevated General bed mobility comments: mod assist to elevate trunk from surface and fully clear legs off of bed with assist to scoot fully to EOB Transfers Overall transfer level: Needs assistance Equipment used: Ambulation equipment used Transfer via Lift Equipment: Stedy Transfers: Sit to/from Stand, [employer] Transfers Sit to Stand: Mod assist, +2 physical assistance, From elevated surface Stand pivot transfers: Min assist, +2 physical assistance  Lateral/Scoot Transfers: +2 physical assistance, Max assist General transfer comment: pt able to stand from bed x 2 and from recliner x 1 with increased ability from chair with armrests then from bed. Cues for scooting, foot and hand positioning with assist for anterior translation and rise from surface. Pt pivoted bed to chair with RW Ambulation/Gait Ambulation/Gait assistance: +2 physical assistance, +2 safety/equipment, Mod assist Ambulation Distance (Feet): 5 Feet Assistive device: Rolling walker (2  wheeled) Gait Pattern/deviations: Shuffle, Trunk flexed General Gait Details: cues for posture, position in RW, looking up with assist for trunk position and RW advancement with chair following closely Gait velocity interpretation: Below normal speed for age/gender    ADL:    Cognition: Cognition Overall Cognitive Status: No family/caregiver present to determine baseline cognitive functioning Orientation Level: Oriented X4 Cognition Arousal/Alertness: Awake/alert Behavior During Therapy: WFL for tasks assessed/performed Overall Cognitive Status: No family/caregiver present to determine  baseline cognitive functioning Area of Impairment: Orientation, Memory Orientation Level: Disoriented to, Time Current Attention Level: Selective Memory: Decreased short-term memory Following Commands: Follows one step commands consistently Safety/Judgement: Decreased awareness of deficits Awareness: Intellectual Problem Solving: Slow processing General Comments: pt disoriented to day, increased time for command following  Blood pressure (!) 101/52, pulse 70, temperature 98.4 F (36.9 C), temperature source Oral, resp. rate 18, height '6\' 1"'$  (1.854 m), weight 84.6 kg (186 lb 8.2 oz), SpO2 100 %. Physical Exam  Nursing note and vitals reviewed. Constitutional: He appears well-developed. No distress.  Pleasant and appropriate. Up in recliner and reports NAD. Obese  HENT:  Head: Normocephalic and atraumatic.  Eyes: Pupils are equal, round, and reactive to light. Conjunctivae and EOM are normal.  Neck: Normal range of motion. Neck supple.  Cardiovascular: Normal rate and regular rhythm.   Respiratory: Effort normal and breath sounds normal.  GI: Soft. Bowel sounds are normal. He exhibits no distension. There is no tenderness.  Eakins pouch in place on midline abdominal wound as well as drain in RLQ.   Genitourinary:  Genitourinary Comments: Foley in place  Musculoskeletal: He exhibits edema and tenderness.  1+ edema right forearm--unable to extend or range at shoulder due to prior fracture?.   Neurological: He is alert.  Speech clear.  Disoriented.  He was able to state name, place and year but thought he was here due to problems with right hand and month as Jan/Feb.  Able to follow simple motor commands.  BUE with limited ROM, motor strength 3+-4-/5 throughout within available ROM.  Dysesthesias right hand.    Skin: Skin is warm and dry. He is not diaphoretic.  Psychiatric: He has a normal mood and affect. His behavior is normal.    No results found for this or any previous visit  (from the past 24 hour(s)). No results found.  Assessment/Plan: Diagnosis: CIP Labs independently reviewed.  Records reviewed and summated above.  1. Does the need for close, 24 hr/day medical supervision in concert with the patient's rehab needs make it unreasonable for this patient to be served in a less intensive setting? Yes 2. 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 Due to bladder management, safety, skin/wound care, disease management, medication administration and patient education, does the patient require 24 hr/day rehab nursing? Yes 4. Does the patient require coordinated care of a physician, rehab nurse, PT (1-2 hrs/day, 5 days/week), OT (1-2 hrs/day, 5 days/week) and SLP (1-2 hrs/day, 5 days/week) to address physical and functional deficits  in the context of the above medical diagnosis(es)? Yes Addressing deficits in the following areas: balance, endurance, locomotion, strength, transferring, bowel/bladder control, bathing, dressing, feeding, toileting, cognition, swallowing and psychosocial support 5. Can the patient actively participate in an intensive therapy program of at least 3 hrs of therapy per day at least 5 days per week? Potentially 6. The potential for patient to make measurable gains while on inpatient rehab is good and fair 7. Anticipated  functional outcomes upon discharge from inpatient rehab are min assist  with PT, min assist with OT, modified independent and supervision with SLP. 8. Estimated rehab length of stay to reach the above functional goals is: 16-20 days. 9. Anticipated D/C setting: Home 10. Anticipated post D/C treatments: HH therapy and Home excercise program 11. Overall Rehab/Functional Prognosis: good  RECOMMENDATIONS: This patient's condition is appropriate for continued rehabilitative care in the following setting: CIR if patient able to tolerate 3 hours of therapy/day. Patient has agreed to participate in recommended program. Potentially Note that insurance prior authorization may be required for reimbursement for recommended care.  Comment: Rehab Admissions Coordinator to follow up.  Delice Lesch, MD, ABPMR Bary Leriche, [place] 01/12/2017

## 2017-01-12 NOTE — Progress Notes (Signed)
Patient ID: Jacob Pittman, male   DOB: 1945/08/06, 71 y.o.   MRN: [id number]  PROGRESS NOTE    Jacob Pittman  QTM:[id number] DOB: [birth year] DOA: 11/26/2016  PCP: [name], MD   Brief Narrative:  71 year old male with ischemic cardiomyopathy status post AICD, hypertension, diabetes, PAD, obstructive sleep apnea, overall poor health who presented to [facility] 11/26/2016 with abdominal pain and was found to have perforated pyloric channel ulcer and pneumoperitonitis. He underwent emergent laparotomy with omental patch repair, postoperatively remained in shock requiring epinephrine and Levothroid drips. His hospital course was complicated by acute ischemia of right arm requiring transfer from [facility] to [facility] and urgent operation, embolectomy. He was intubated and had slow vent weaning. He was transferred from critical care medicine service to the hospitalist service 12/12/2016.  Significant events: 8/12- TTELV moderately dilated with EF 15% &diffuse hypokinesis. There is akinesis of the inferolateral and inferior myocardium. E coli UTI noted 8/13- right brachial A- line placed and then developed ischemic right arm- vascular consult and transfer from [facility]- thrombectomy of brachial artery- heparin Paroxysmal A-fib also noted 8/14-Decreased attenuation involving the right dentate nucleus in the right cerebellum and immediately adjacent cerebellar parenchyma, concerning for recent and potentially acute infarct in this area.  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- anticoagulation recommended to be stopped for 10-14 days - H pyloli + started on triple therapy - Transaminitis thought to be du to Diflucan hepatotoxicity 8/17- febrile again and vasopressors resumed 8/23- extubated 8/25A-fib with HR up to 160s, fever 101.6 at 4 AM-care transferred to [facility]  Assessment & Plan:   Perforated pyloric ulcer/H. pylori infection /  Intra-abdominal abscess  - Complicated hospital course - S/P exploratory laparotomy, graham patch repair 11/27/2016 by Dr. Ezzard Standing - Has developed postoperative abscess - S/P IR drain 12/24/2016 which was removed 12/30/2016 - Completed course of abx for H/Pylori infection   Sigmoid colocutaneous fistula - As per surgery, fistula will be left open - Continue Eakin's pouch as managed by wound care -disposition as per general surgery as an outpatient and will defer wound care as well as ability to base to them we will ask them these questions before patient is discharged  MRSE bacteremia - Central line discontinued 12/24/2016 - TTE does not mention endocarditis - TEE not done due to patient's multiple comorbidities - Patient's blood cultures were suggestive of but not confirming endocarditis, possible infection of AICD but he was thought that removal of a SCD is a major surgery which patient would probably not tolerate so this was not done - Surveillance cultures on 01/01/2017 were negative - Per infectious disease, recommended indefinite doxycyclinelifelong  Septic shock - Resolved   Acute on chronic systolic and diastolic CHF - Repeat ECHO showed improved EF 25-30% from 15% with grade 2 DD - No respiratory distress   Paroxysmal atrial fibrillation - CHA2DS2-VASc Score 2  - In sinus rhythm - Continue apixaban for anticoagulation   Bibasilar atelectasis - IS while awake as needed - patient does not seem to be in any respiratory distress and is breathing comfortably  Hyponatremia - Improved   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 - Continue apixaban  Arterial thrombus right brachial artery - Secondary to A-line, provoked - S/P thrombectomy 11/30/16 - Continue apixaban  Bilateral upper extremity edema - No DVT on UE doppler - Has gotten lasix 10 mg oral dose  - Improving   Fatty liver - Supportive care   Pacer firing -  Seen by cardio for device interrogation  -  Stable   Cognitive impairment was probably post infectious delirium and is completely resolved  Type II DM without complications  - A1c 5.8  Adult failure to thrive - failure to thrive is now reversed and patient is doing well  Dysphagia - Dysphagia 2 diet  Acute urinary retention - Noted on 9/15, foley inserted   Hypokalemia - Due to acute illness - Supplemented and WNL  Anemia of chronic illness - Hgb stable   Acute respiratory failure with hypoxia - Intubated and successfully extubated - Stable respiratory status    DVT prophylaxis: Apixaban Code Status: DNR/DNI Family Communication: no family at the bedside; called pt daughter, gave update  Disposition Plan: LTACH pending insurance approval    Consultants:   General surgery  PCCM  Interventional radiology  Palliative care medicine  Infectious disease   Cardiology  Vascular surgery  Neurology  Procedures:   8/10- ex lap- patch repair of pyloric ucler  8/13- brachial embolectomy  8/12- EF 15%, diffuse hypokinesis and akinesis of inferolateral/inferioir myocardium  9/6- Percutaneous drain  9/7- Echocardiogram: EF of 25-30%, grade 2 diastolic dysfunction. No evidence of vegetations  Antimicrobials:   Doxycycline -->   Subjective: No overnight events.  Objective: Vitals:   01/11/17 1343 01/11/17 2135 01/12/17 0540 01/12/17 1407  BP: (!) 94/51 (!) 100/52 (!) 101/52 (!) 106/45  Pulse: 76 84 70 77  Resp: Temp: 98.2 F (36.8 C) 98 F (36.7 C) 98.4 F (36.9 C) 98.6 F (37 C)  TempSrc: Axillary Oral Oral Oral  SpO2: 100% 100% 100% 100%  Weight:      Height:        Intake/Output Summary (Last 24 hours) at 01/12/17 1850 Last data filed at 01/12/17 1700  Gross per 24 hour  Intake              600 ml  Output              800 ml  Net             -200 ml   Filed Weights   12/29/16 0300 12/30/16 0400 01/02/17 0349  Weight: 82.5 kg (181 lb 14.1 oz) 84.3 kg (185 lb  13.6 oz) 84.6 kg (186 lb 8.2 oz)    Examination:  , comfortable laying in bed without any distress Needs to be seated up and is asking me if I can help him with the same Conversant and orientedNo icterus no pallor No JVD S1-S2 holosystolic murmur Abdomen soft patient does have a large ostomy bag overopen abdominal wound which appears to be cleanand healing by secondary intent Lower extremities and soft nontender no rebound no guarding Neurologically intact moving all 4 limbs  Data Reviewed: I have personally reviewed following labs and imaging studies  CBC:  Recent Labs Lab 01/10/17 0426 01/11/17 0533  WBC 5.0 4.7  HGB 9.5* 9.8*  HCT 31.1* 31.1*  MCV 104.7* 104.7*  PLT 254 248   Basic Metabolic Panel:  Recent Labs Lab 01/07/17 0457 01/08/17 0520 01/09/17 0703 01/11/17 0533  NA 136 135 136 136  K 4.4 4.1 4.5 3.9  CL 104 104 104 104  CO2 GLUCOSE 73 72 78 72  BUN CREATININE 0.73 0.75 0.90 0.80  CALCIUM 8.1* 8.2* 8.2* 8.1*   GFR: Estimated Creatinine Clearance: 92.9 mL/min (by C-G formula based on SCr of 0.8 mg/dL). Liver Function  Tests: No results for input(s): AST, ALT, ALKPHOS, BILITOT, PROT, ALBUMIN in the last 168 hours. No results for input(s): LIPASE, AMYLASE in the last 168 hours. No results for input(s): AMMONIA in the last 168 hours. Coagulation Profile: No results for input(s): INR, PROTIME in the last 168 hours. Cardiac Enzymes: No results for input(s): CKTOTAL, CKMB, CKMBINDEX, TROPONINI in the last 168 hours. BNP (last 3 results) No results for input(s): PROBNP in the last 8760 hours. HbA1C: No results for input(s): HGBA1C in the last 72 hours. CBG:  Recent Labs Lab 01/06/17 0310 01/06/17 0746 01/06/17 1214 01/06/17 1604 01/06/17 1929  GLUCAP 65 73 98 87 91   Lipid Profile: No results for input(s): CHOL, HDL, LDLCALC, TRIG, CHOLHDL, LDLDIRECT in the last 72 hours. Thyroid Function Tests: No results for  input(s): TSH, T4TOTAL, FREET4, T3FREE, THYROIDAB in the last 72 hours. Anemia Panel: No results for input(s): VITAMINB12, FOLATE, FERRITIN, TIBC, IRON, RETICCTPCT in the last 72 hours. Urine analysis:    Component Value Date/Time   COLORURINE AMBER (A) 01/02/2017 2214   APPEARANCEUR HAZY (A) 01/02/2017 2214   LABSPEC 1.023 01/02/2017 2214   PHURINE 5.0 01/02/2017 2214   GLUCOSEU NEGATIVE 01/02/2017 2214   GLUCOSEU NEGATIVE 09/10/2016 1002   HGBUR MODERATE (A) 01/02/2017 2214   BILIRUBINUR NEGATIVE 01/02/2017 2214   KETONESUR NEGATIVE 01/02/2017 2214   PROTEINUR 30 (A) 01/02/2017 2214   UROBILINOGEN >=8.0 (A) 09/10/2016 1002   NITRITE NEGATIVE 01/02/2017 2214   LEUKOCYTESUR NEGATIVE 01/02/2017 2214   Sepsis Labs: (procalcitonin:4,lacticidven:4)    No results found for this or any previous visit (from the past 240 hour(s)).    Radiology Studies: No results found.    Scheduled Meds: . apixaban  5 mg Oral Q12H  . collagenase   Topical Daily  . doxycycline  100 mg Oral Q12H  . feeding supplement   237 mL Oral BID BM  . furosemide  20 mg Oral Daily  . midodrine  2.5 mg Oral TID WC  . pantoprazole  40 mg Oral BID   Continuous Infusions:   LOS: 46 days    Time spent: 25 minutes  Greater than 50% of the time spent on counseling and coordinating the care.   Pleas Koch, MD [phone]   If 7PM-7AM, please contact night-coverage [email or web address] Password [email or web address] 01/12/2017, 6:50 PM

## 2017-01-12 NOTE — Progress Notes (Signed)
Tech offered Pt food and drink. Pt drank some water and stated that his daughter is bringing him some lunch, he will pass on his lunch tray.

## 2017-01-12 NOTE — Progress Notes (Addendum)
Given the complexity of his care needs and his high motivation for rehab/recovery I am going to request CIR do a formal evaluation- this was recommended as a consideration in my peer-to-peer discussion with the medical director at [facility] doing the chart review. Mr. Zornes has a strong support system-his daughter has a PT background and is willing to care for him when he is more stable medically and able to mobilize.  Anderson Malta, DO Palliative Medicine [phone]

## 2017-01-12 NOTE — Progress Notes (Addendum)
Physical Therapy Treatment Patient Details Name: Jacob Pittman MRN: [id number] DOB: [birth year] Today's Date: 01/12/2017    History of Present Illness Patient is a 71 yo male admitted 11/26/16 with perforated duodenal bulb ulcer, now s/p exploratory lap and repair on 11/27/16. Postoperatively remained in shock requiring epinephrine and Levophed drip. Also complicated by ischemic RUE and is s/p embolectomy.  Patient was slow to wean from vent, extubated on 12/10/16.  Patient with confusion.     PMH:   ICM, DM, HTN, PAD, OSA, CHF, EF 15%, ICD, PAF, OA    PT Comments    Pt moving well with increased ability for all transfers and actually able to ambulate short distance today. Pt fatigued end of session. Pt able to reach his face with wash cloth today but requires encouragement to maximize function and do as much for himself as possible. Pt encouraged to continue HEP throughout the day as well as mobilize with nursing. Will continue to follow.    Follow Up Recommendations  SNF;Supervision/Assistance - 24 hour     Equipment Recommendations       Recommendations for Other Services       Precautions / Restrictions Precautions Precautions: Fall Precaution Comments: ostomy, abdominal binder Restrictions Weight Bearing Restrictions: No    Mobility  Bed Mobility Overal bed mobility: Needs Assistance Bed Mobility: Supine to Sit     Supine to sit: Mod assist     General bed mobility comments: mod assist to elevate trunk from surface and fully clear legs off of bed with assist to scoot fully to EOB  Transfers Overall transfer level: Needs assistance   Transfers: Sit to/from Stand;Stand Pivot Transfers Sit to Stand: Mod assist;+2 physical assistance;From elevated surface Stand pivot transfers: Min assist;+2 physical assistance       General transfer comment: pt able to stand from bed x 2 and from recliner x 1 with increased ability from chair with armrests then from bed. Cues for  scooting, foot and hand positioning with assist for anterior translation and rise from surface. Pt pivoted bed to chair with RW  Ambulation/Gait Ambulation/Gait assistance: +2 physical assistance;+2 safety/equipment;Mod assist Ambulation Distance (Feet): 5 Feet Assistive device: Rolling walker (2 wheeled) Gait Pattern/deviations: Shuffle;Trunk flexed   Gait velocity interpretation: Below normal speed for age/gender General Gait Details: cues for posture, position in RW, looking up with assist for trunk position and RW advancement with chair following closely   Stairs            Wheelchair Mobility    Modified Rankin (Stroke Patients Only)       Balance Overall balance assessment: Needs assistance   Sitting balance-Leahy Scale: Fair       Standing balance-Leahy Scale: Poor                              Cognition Arousal/Alertness: Awake/alert Behavior During Therapy: WFL for tasks assessed/performed Overall Cognitive Status: No family/caregiver present to determine baseline cognitive functioning                   Orientation Level: Disoriented to;Time Current Attention Level: Selective Memory: Decreased short-term memory Following Commands: Follows one step commands consistently     Problem Solving: Slow processing        Exercises General Exercises - Lower Extremity Long Arc Quad: AROM;Both;Seated;10 reps Hip ABduction/ADduction: AROM;Both;Seated;10 reps Hip Flexion/Marching: AROM;Both;Seated;10 reps    General Comments        Pertinent  Vitals/Pain Pain Assessment: 0-10 Pain Score: 4  Pain Location: RUE with movement Pain Descriptors / Indicators: Sore Pain Intervention(s): Limited activity within patient's tolerance;Repositioned    Home Living                      Prior Function            PT Goals (current goals can now be found in the care plan section) Progress towards PT goals: Progressing toward goals     Frequency    Min 2X/week      PT Plan Current plan remains appropriate    Co-evaluation              AM-PAC PT "6 Clicks" Daily Activity  Outcome Measure  Difficulty turning over in bed (including adjusting bedclothes, sheets and blankets)?: A Lot Difficulty moving from lying on back to sitting on the side of the bed? : Unable Difficulty sitting down on and standing up from a chair with arms (e.g., wheelchair, bedside commode, etc,.)?: Unable Help needed moving to and from a bed to chair (including a wheelchair)?: A Lot Help needed walking in hospital room?: A Lot Help needed climbing 3-5 steps with a railing? : Total 6 Click Score: 9    End of Session Equipment Utilized During Treatment: Gait belt Activity Tolerance: Patient tolerated treatment well Patient left: in chair;with chair alarm set;with call bell/phone within reach Nurse Communication: Mobility status PT Visit Diagnosis: Muscle weakness (generalized) (M62.81);Difficulty in walking, not elsewhere classified (R26.2)     Time: 6010-9323 PT Time Calculation (min) (ACUTE ONLY): 24 min  Charges:  $Therapeutic Exercise: 8-22 mins $Therapeutic Activity: 8-22 mins                    G Codes:       [name], PT [phone]   Ritaj Dullea B Jaleena Viviani 01/12/2017, 10:19 AM

## 2017-01-13 MED ORDER — DOCUSATE SODIUM 100 MG PO CAPS
100.0000 mg | ORAL_CAPSULE | Freq: Every day | ORAL | 0 refills | Status: AC | PRN
Start: 2017-01-13 — End: ?

## 2017-01-13 MED ORDER — COLLAGENASE 250 UNIT/GM EX OINT
TOPICAL_OINTMENT | Freq: Every day | CUTANEOUS | 0 refills | Status: AC
Start: 2017-01-14 — End: ?

## 2017-01-13 MED ORDER — APIXABAN 5 MG PO TABS: 5.0000 mg | ORAL_TABLET | Freq: Two times a day (BID) | ORAL | 0 refills | Status: AC

## 2017-01-13 MED ORDER — ONDANSETRON 4 MG PO TBDP: 4.0000 mg | ORAL_TABLET | Freq: Four times a day (QID) | ORAL | 0 refills | Status: AC | PRN

## 2017-01-13 MED ORDER — ACETAMINOPHEN 500 MG PO TABS: 1000.0000 mg | ORAL_TABLET | Freq: Three times a day (TID) | ORAL | 0 refills | Status: AC

## 2017-01-13 MED ORDER — PANTOPRAZOLE SODIUM 40 MG PO TBEC: 40.0000 mg | DELAYED_RELEASE_TABLET | Freq: Two times a day (BID) | ORAL | 0 refills | Status: DC

## 2017-01-13 MED ORDER — MIDODRINE HCL 2.5 MG PO TABS: 2.5000 mg | ORAL_TABLET | Freq: Three times a day (TID) | ORAL | 0 refills | Status: DC

## 2017-01-13 MED ORDER — ENSURE ENLIVE PO LIQD: 237.0000 mL | Freq: Two times a day (BID) | ORAL | 12 refills | Status: DC

## 2017-01-13 MED ORDER — MIDODRINE HCL 5 MG PO TABS
2.5000 mg | ORAL_TABLET | Freq: Three times a day (TID) | ORAL | Status: DC
  Administered 2017-01-13: 2.5 mg via ORAL
  Filled 2017-01-13: qty 1

## 2017-01-13 MED ORDER — DOXYCYCLINE HYCLATE 100 MG PO TABS: 100.0000 mg | ORAL_TABLET | Freq: Two times a day (BID) | ORAL | 0 refills | Status: AC

## 2017-01-13 NOTE — Progress Notes (Signed)
Called [facility] for transport 

## 2017-01-13 NOTE — Discharge Summary (Signed)
Physician Discharge Summary  Jacob Pittman NOB:[id number] DOB: [birth year] DOA: 11/26/2016  PCP: Biagio Borg, MD  Admit date: 11/26/2016 Discharge date: 01/13/2017  Admitted From: Home  Disposition:  [facility]AC  Recommendations for Outpatient Follow-up:  1. Follow up with provider in [place] at earliest convenience 2. Follow-up with Dr. Lucia Gaskins as scheduled 3. Abdominal wound care as per general surgery recommendations 4. Patient might need palliative care evaluation and follow-up at [place] 5. Follow-up with cardiology in 2-3 weeks  Home Health: No  Equipment/Devices: Foley catheter  Discharge Condition: Poor CODE STATUS: DO NOT RESUSCITATE  Diet recommendation: Heart Healthy / Carb Modified/dysphagia  Brief/Interim Summary: Brief Narrative:  71 year old male with ischemic cardiomyopathy status post AICD, hypertension, diabetes, PAD, obstructive sleep apnea, overall poor health who presented to [facility] 11/26/2016 with abdominal pain and was found to have perforated pyloric channel ulcer and pneumoperitonitis. He underwent emergent laparotomy with omental patch repair, postoperatively remained in shock requiring epinephrine and Levophed drips. His hospital course was complicated by acute ischemia of right arm requiring transfer from [facility] to [facility] and urgent operation, embolectomy. He was intubated and had slow vent weaning. He was transferred from critical care medicine service to the hospitalist service 12/12/2016.  Significant events: 8/12- TTELV moderately dilated with EF 15% &diffuse hypokinesis. There is akinesis of the inferolateral and inferior myocardium. E coli UTI noted 8/13- right brachial A- line placed and then developed ischemic right arm- vascular consult and transfer from [facility] to [facility]- thrombectomy of brachial artery- heparin Paroxysmal A-fib also noted 8/14-CT head: Decreased attenuation involving the right dentate nucleus in the right cerebellum and  immediately adjacent cerebellar parenchyma, concerning for recent and potentially acute infarct in this area.  8/15-neuro eval- suspected cardioembolic CVA from A-fib- anticoagulation recommended to be stopped for 10-14 days - H pylori + started on triple therapy - Transaminitis thought to be due to Diflucan hepatotoxicity 8/17- febrile again and vasopressors resumed 8/23- extubated 8/25A-fib with HR up to 160s, fever 101.6 at 4 AM-care transferred to [facility]  Palliative care has evaluated the patient and is currently following. Patient's condition is overall guarded to poor. 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 Patient completed IV antibiotics treatment as per ID recommendations for intra-abdominal abscess for which patient had IR drain on 12/24/2016 which was removed on 12/30/2016.   If patient's condition worsens, patient would probably benefit from hospice/comfort measures.  Discharge Diagnoses:  Principal Problem:   Perforated duodenal bulb ulcer ([place]) Active Problems:   Acute on chronic combined systolic and diastolic CHF (congestive heart failure) (HCC)   Nonischemic cardiomyopathy (HCC)   Pneumoperitoneum   Acute respiratory failure with hypoxia (HCC)   Paroxysmal A-fib (HCC)   CVA (cerebral vascular accident) ([place])   Arterial thrombosis (HCC)   H pylori ulcer   Fatty infiltration of liver   DM (diabetes mellitus), type 2 (HCC)   Pressure sore on heel, left, unstageable (HCC)   Poor venous access   Palliative care encounter   Goals of care, counseling/discussion   Bowel perforation (HCC)   Sepsis ([place])   Coag negative Staphylococcus bacteremia   Bacteremia   Fever   Intra-abdominal abscess (HCC)   Encephalopathy   Multiple wounds   Paranoia ([place])   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 pylori infection / Intra-abdominal abscess  -  Complicated hospital course - S/P exploratory laparotomy, graham patch repair 11/27/2016 by Dr. Lucia Gaskins -  developed postoperative abscess S/P IR drain 12/24/2016 which was removed 12/30/2016 - Completed course of abx for H Pylori infection and completed antibiotic treatment for intra-abdominal abscesses as per ID recommendations - Wound care as per general surgery. Outpatient follow-up with general surgery  Sigmoid colocutaneous fistula - As per surgery, fistula will be left open - Continue Eakin's pouch as managed by wound care -Recommend outpatient follow-up with Wound Care as well  MRSE bacteremia - Central line discontinued 12/24/2016 - TTE does not mention endocarditis - TEE not done due to patient's multiple comorbidities - Patient's blood cultures were suggestive of but not confirming endocarditis, possible infection of AICD but it was thought that removal of a AICD is a major surgery which patient would probably not tolerate so this was not done - Surveillance cultures on 01/01/2017 were negative - Per infectious disease, recommended indefinite doxycycline lifelong  Septic shock - Resolved   Acute on chronic systolic and diastolic CHF - Repeat ECHO showed improved EF 25-30% from 15% with grade 2 DD - No respiratory distress  - Currently not on beta blockers or diuretics because of relatively low blood pressure.  Paroxysmal atrial fibrillation - CHA2DS2-VASc Score 2  - In sinus rhythm - Continue apixaban for anticoagulation  - If heart rate increases, patient can be put back on either metoprolol or Coreg  Bibasilar atelectasis - Incentive spirometry while awake as needed - patient does not seem to be in any respiratory distress and is breathing comfortably  Hyponatremia - Improved   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 - Continue apixaban  Arterial thrombus right brachial artery - Secondary to A-line, provoked - S/P thrombectomy 11/30/16 - Continue  apixaban  Bilateral upper extremity edema - No DVT on UE doppler - Improving   Fatty liver - Supportive care   Pacer firing - Seen by cardio for device interrogation  - Stable  - Outpatient follow-up with cardiology  Cognitive impairment was probably post infectious delirium and is completely resolved  Type II DM without complications  - Z0C 5.8 - Outpatient follow-up  Adult failure to thrive - failure to thrive is now reversed and patient is doing well  Dysphagia - Dysphagia 2 diet  Acute urinary retention - Noted on 9/15, foley inserted  - Outpatient follow-up  Hypokalemia - Due to acute illness - Supplemented and WNL  Anemia of chronic illness - Hgb stable   Acute respiratory failure with hypoxia - Intubated and successfully extubated - Stable respiratory status   Discharge Instructions  Discharge Instructions    Call MD for:  difficulty breathing, headache or visual disturbances    Complete by:  As directed    Call MD for:  extreme fatigue    Complete by:  As directed    Call MD for:  hives    Complete by:  As directed    Call MD for:  persistant dizziness or light-headedness    Complete by:  As directed    Call MD for:  persistant nausea and vomiting    Complete by:  As directed    Call MD for:  redness, tenderness, or signs of infection (pain, swelling, redness, odor or green/yellow discharge around incision site)    Complete by:  As directed    Call MD for:  severe uncontrolled pain    Complete by:  As directed    Call MD for:  temperature >100.4    Complete by:  As directed    Diet - low sodium heart  healthy    Complete by:  As directed    Diet Carb Modified    Complete by:  As directed    Discharge instructions    Complete by:  As directed    Fall precautions   Increase activity slowly    Complete by:  As directed      Allergies as of 01/13/2017   No Known Allergies     Medication List    STOP taking these medications    carvedilol 12.5 MG tablet Commonly known as:  COREG   cetirizine 10 MG tablet Commonly known as:  ZYRTEC   furosemide 40 MG tablet Commonly known as:  LASIX   metFORMIN 500 MG 24 hr tablet Commonly known as:  GLUCOPHAGE-XR   metolazone 2.5 MG tablet Commonly known as:  ZAROXOLYN   sildenafil 20 MG tablet Commonly known as:  REVATIO   silver sulfADIAZINE 1 % cream Commonly known as:  SILVADENE   spironolactone 25 MG tablet Commonly known as:  ALDACTONE   Tadalafil 2.5 MG Tabs     TAKE these medications   acetaminophen 500 MG tablet Commonly known as:  TYLENOL Take 2 tablets (1,000 mg total) by mouth 3 (three) times daily.   albuterol 108 (90 Base) MCG/ACT inhaler Commonly known as:  PROVENTIL HFA;VENTOLIN HFA Inhale 2 puffs into the lungs every 6 (six) hours as needed for wheezing or shortness of breath.   apixaban 5 MG Tabs tablet Commonly known as:  ELIQUIS Take 1 tablet (5 mg total) by mouth every 12 (twelve) hours.   aspirin 81 MG EC tablet Take 81 mg by mouth daily.   collagenase ointment Commonly known as:  SANTYL Apply topically daily.   docusate sodium 100 MG capsule Commonly known as:  COLACE Take 1 capsule (100 mg total) by mouth daily as needed for mild constipation.   doxycycline 100 MG tablet Commonly known as:  VIBRA-TABS Take 1 tablet (100 mg total) by mouth every 12 (twelve) hours.   feeding supplement (ENSURE ENLIVE) Liqd Take 237 mLs by mouth 2 (two) times daily between meals.   fluticasone 50 MCG/ACT nasal spray Commonly known as:  FLONASE Place 2 sprays into both nostrils daily.   glucose blood test strip Use as instructed   Lancets Misc Use as directed once daily   midodrine 2.5 MG tablet Commonly known as:  PROAMATINE Take 1 tablet (2.5 mg total) by mouth 3 (three) times daily with meals.   ondansetron 4 MG disintegrating tablet Commonly known as:  ZOFRAN-ODT Take 1 tablet (4 mg total) by mouth every 6 (six) hours as  needed for nausea.   ONE TOUCH ULTRA SYSTEM KIT w/Device Kit Use as directed daily   pantoprazole 40 MG tablet Commonly known as:  PROTONIX Take 1 tablet (40 mg total) by mouth 2 (two) times daily before a meal.   rosuvastatin 20 MG tablet Commonly known as:  CRESTOR Take 1 tablet (20 mg total) by mouth daily.            Discharge Care Instructions        Start     Ordered   01/14/17 0000  collagenase (SANTYL) ointment  Daily     01/13/17 1403   01/13/17 0000  apixaban (ELIQUIS) 5 MG TABS tablet  Every 12 hours     01/13/17 1403   01/13/17 0000  acetaminophen (TYLENOL) 500 MG tablet  3 times daily     01/13/17 1403   01/13/17 0000  docusate sodium (COLACE)  100 MG capsule  Daily PRN     01/13/17 1403   01/13/17 0000  doxycycline (VIBRA-TABS) 100 MG tablet  Every 12 hours     01/13/17 1403   01/13/17 0000  feeding supplement, ENSURE ENLIVE, (ENSURE ENLIVE) LIQD  2 times daily between meals     01/13/17 1403   01/13/17 0000  midodrine (PROAMATINE) 2.5 MG tablet  3 times daily with meals     01/13/17 1403   01/13/17 0000  ondansetron (ZOFRAN-ODT) 4 MG disintegrating tablet  Every 6 hours PRN     01/13/17 1403   01/13/17 0000  pantoprazole (PROTONIX) 40 MG tablet  2 times daily before meals     01/13/17 1403   01/13/17 0000  Increase activity slowly     01/13/17 1403   01/13/17 0000  Diet - low sodium heart healthy     01/13/17 1403   01/13/17 0000  Discharge instructions    Comments:  Fall precautions   01/13/17 1403   01/13/17 0000  Diet Carb Modified     01/13/17 1403   01/13/17 0000  Call MD for:  temperature >100.4     01/13/17 1403   01/13/17 0000  Call MD for:  persistant nausea and vomiting     01/13/17 1403   01/13/17 0000  Call MD for:  severe uncontrolled pain     01/13/17 1403   01/13/17 0000  Call MD for:  redness, tenderness, or signs of infection (pain, swelling, redness, odor or green/yellow discharge around incision site)     01/13/17 1403    01/13/17 0000  Call MD for:  difficulty breathing, headache or visual disturbances     01/13/17 1403   01/13/17 0000  Call MD for:  hives     01/13/17 1403   01/13/17 0000  Call MD for:  persistant dizziness or light-headedness     01/13/17 1403   01/13/17 0000  Call MD for:  extreme fatigue     01/13/17 1403     Follow-up Information    Alphonsa Overall, MD. Call in 2 week(s).   Specialty:  General Surgery Why:  We are working on your appointment, please call to confirm. Contact information: [place] [address] [phone]          No Known Allergies  Consultations:  General surgery  PCCM  Interventional radiology  Palliative care medicine  Infectious disease   Cardiology  Vascular surgery  Neurology  Procedures/Studies: Ct Abdomen Pelvis W Contrast  Result Date: 12/29/2016 CLINICAL DATA:  Abdominal infection including peritonitis. Repair of perforated pyloric channel ulcer, 11/27/2016. EXAM: CT ABDOMEN AND PELVIS WITH CONTRAST TECHNIQUE: Multidetector CT imaging of the abdomen and pelvis was performed using the standard protocol following bolus administration of intravenous contrast. CONTRAST:  125m ISOVUE-300 IOPAMIDOL (ISOVUE-300) INJECTION 61% COMPARISON:  12/24/2016, 12/21/2016, 12/13/2016 CT FINDINGS: Lower chest: Stable cardiomegaly without pericardial effusion. ICD lead noted in the right ventricle. Moderate right and small left pleural effusions are present with adjacent compressive atelectasis, slightly increased on the left since 12/21/2016. Hepatobiliary: 1 cm subcapsular hypodense lesion in the right hepatic lobe, series 3, image 22, stable in appearance, more commonly associated with a cyst or hemangioma. No biliary dilatation. Physiologic distention of the gallbladder without calculus or secondary signs of acute cholecystitis. Pancreas: Unremarkable Spleen: Unremarkable Adrenals/Urinary Tract: Stable bilateral renal cysts. No  obstructive uropathy. Normal bilateral adrenal glands. No hydroureteronephrosis. The urinary bladder is physiologically distended. Stomach/Bowel: A feeding  tube is seen within the distal duodenum terminating near the ligament of Treitz. Stomach is physiologically distended with enteric contrast. No bowel obstruction or inflammation. Again noted are a few dots of gas along the gastrohepatic ligament without significant worsening. Enteric contrast reaches the rectum. 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 Interval drainage via percutaneous pigtail catheter of the fluid collection within the lower abdomen. The tip of the pigtail catheter seen in the midline of the upper pelvis with small residual volume of fluid/abscess seen measuring approximately 4.9 x 2.5 x 2.5 cm. Vascular/Lymphatic: Moderate aortoiliac and branch vessel atherosclerosis. No aneurysm. No lymphadenopathy. Reproductive: Unremarkable Other: Percutaneous drainage pigtail catheter is noted knee left lower quadrant approach. Mild third spacing of fluid in the subcutaneous and mesenteric soft tissues. Musculoskeletal: Thoracolumbar spondylosis. Lumbosacral transitional vertebral anatomy. IMPRESSION: 1. Interval near complete decompression of previously noted abscess cavity status post percutaneous drainage catheter placement via left lower quadrant approach. A small amount of residual fluid persist measuring 4.9 x 2.5 x 2.5 cm to the right of the catheter. 2. 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 3. Slight increase small left effusion. 4. Several small loculations of gas are again seen along the gastrohepatic ligament without significant change. 5. 1 cm  fluid attenuating structure along the posterior right hepatic lobe margin, nonspecific but stable in appearance. 6. Moderate cardiomegaly with bilateral right greater than left pleural effusions with compressive atelectasis. 7. Feeding tube noted within the distal duodenum. 8. Mild third spacing of fluid. Electronically Signed   By: Ashley Royalty M.D.   On: 12/29/2016 15:49   Ct Abdomen Pelvis W Contrast  Addendum Date: 12/21/2016   ADDENDUM REPORT: 12/21/2016 20:15 CONTRAST EXTRAVASATION CONSULTATION: Type of contrast:  Isovue 300 Site of extravasation: Proximal left upper extremity peripheral IV site. Estimated volume of extravasation: 80 ml Area of extravasation scanned with CT? no PATIENT'S SIGNS AND SYMPTOMS Skin blistering/ulceration: no Decrease capillary refill: no Change in skin color: no Decreased motor function or severe tightness: no Decreased pulses distal to site of extravasation: no Altered sensation: no Increasing pain or signs of increased swelling during observation: no TREATMENT Observation period at site: Immediately after the extravasation event by Dr. Polly Cobia. Patient subsequently returned to inpatient floor. Limb elevation and ice / heat pack application recommended. Plastic surgery consulted? no DOCUMENTATION AND FOLLOW-UP Was additional follow up assigned to PA's? no Patient's questions answered? yes Patient is an inpatient and by report the CT technologist informed the nursing staff taking care of the patient about the IV contrast extravasation event. Electronically Signed   By: Ilona Sorrel M.D.   On: 12/21/2016 20:15   Result Date: 12/21/2016 CLINICAL DATA:  Perforated duodenal ulcer, status post Phillip Heal patch on 11/27/2016. 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 Abdominal pain. EXAM: CT ABDOMEN AND PELVIS WITH CONTRAST TECHNIQUE: Multidetector CT imaging of the abdomen and pelvis was performed using the standard protocol following bolus administration of intravenous contrast. CONTRAST:  <See  Chart> ISOVUE-300 IOPAMIDOL (ISOVUE-300) INJECTION 61% COMPARISON:  12/13/2016 FINDINGS: The patient was unable to raise is all arms, causing streak artifact which reduces diagnostic sensitivity and specificity. Lower chest: Moderate cardiomegaly. AICD lead noted. Moderate right and small left pleural effusions with passive atelectasis. Hepatobiliary: Hypodense lesion posteriorly in the right  hepatic lobe is obscured by the streak artifact but appears similar to 12/13/16. Pancreas: Unremarkable Spleen: Unremarkable Adrenals/Urinary Tract: Several small renal cysts appear stable. Adrenal glands normal. Stomach/Bowel: A feeding tube extends through the stomach and duodenum to terminate at about the level of the ligament of Treitz. Again observed are loculations of abnormal gas density along the gastrohepatic ligament, not appreciably changed in position and with marginal walls. There is prominent abnormal wall thickening in the sigmoid colon along a distended loop. Within the adjacent sigmoid colon mesentery and adjacent omentum, there is a 16.7 by 5.6 by 7.0 cm (volume = 340 cm^3) abscess which appears to drain through the omentum to the wound about at image 62/3, and also shown on image 69/8. Moreover, there is a faintly seen potential connection of the proximal sigmoid colon to this abscess cavity on images 17-18 of series 7, with there is a small linear gas collection tracking from the sigmoid to the collection. This smaller connection to the sigmoid colon is not absolutely certain but is suspected. Orally administered contrast extends through to the rectum. I do not see well-defined extension of oral contrast into the dominant abscess cavity. Vascular/Lymphatic: Aortoiliac atherosclerotic vascular disease. Reproductive: Unremarkable Other: Low-grade mesenteric, omental, and subcutaneous edema. Mildly asymmetric edema along the right flank. Musculoskeletal: Bridging spurring of both sacroiliac joints. Lumbar  spondylosis and degenerative disc disease with impingement at L3-4 and L4-5. IMPRESSION: 1. There is an approximately 340 cc abscess tracking along the sigmoid colon mesentery and omentum and draining out through the wound. Questionable connection of this abscess to the thickened and abnormal sigmoid colon. However, we do not demonstrate definite oral contrast extends in from the sigmoid colon into this abscess, and the fistulous connection between the sigmoid colon and the abscess is questionable. 2. Several stable loculations of gas along the gastrohepatic ligament, similar to 12/13/2016. No significant fluid component to these presume small abscesses which have a similar configuration to the prior exam. 3. Stable small fluid density structure along the posterior right hepatic lobe margin, partially obscured by streak artifact. 4. Third spacing of fluid with mesenteric, omental, and subcutaneous edema. 5. Feeding tube extends to the ligament of Treitz. 6. Moderate cardiomegaly. Moderate right and small left pleural effusions with passive atelectasis. Electronically Signed: By: Van Clines M.D. On: 12/21/2016 14:13   Dg Chest Port 1 View  Result Date: 12/27/2016 CLINICAL DATA:  Pleural effusion. EXAM: PORTABLE CHEST 1 VIEW COMPARISON:  Most recent chest radiograph yesterday. FINDINGS: Enteric tube in place, tip below the diaphragm. Left-sided pacemaker remains in place. Stable cardiomegaly and mediastinal contours. Hazy opacity at the lung bases consistent with pleural effusions and likely underlying atelectasis, worsening prior exam. Worsening vascular congestion. No pneumothorax. IMPRESSION: Worsening hazy opacity at the lung bases consistent with increasing pleural effusions/atelectasis. Worsening vascular congestion. Electronically Signed   By: Jeb Levering M.D.   On: 12/27/2016 17:18   Dg Chest Port 1 View  Result Date: 12/23/2016 CLINICAL DATA:  Shortness of breath; chronic CHF,  cardiomyopathy, peripheral vascular disease EXAM: PORTABLE CHEST 1 VIEW COMPARISON:  Portable chest x-ray of December 22, 2016 FINDINGS: The lungs are reasonably well inflated. Bibasilar densities are less conspicuous today. There are small bilateral pleural effusions. The cardiac silhouette remains enlarged. The pulmonary vascularity is less engorged. The ICD is in stable position. The feeding tube tip projects below the inferior margin of the image. A left internal jugular venous catheter tip projects in the midportion of the SVC. There is  calcification in the wall of the aortic arch. IMPRESSION: Decreased pulmonary interstitial edema. Decreased bibasilar atelectasis or pneumonia. Persistent mild CHF with small effusions. Electronically Signed   By: David  Martinique M.D.   On: 12/23/2016 08:27   Dg Chest Port 1 View  Result Date: 12/22/2016 CLINICAL DATA:  Shortness of Breath EXAM: PORTABLE CHEST 1 VIEW COMPARISON:  12/21/2016 FINDINGS: Left AICD remains in place, unchanged. Cardiomegaly with bilateral airspace opacities, right greater than left. Small bilateral effusions. Overall air perforation and the right lung worsened since prior study. IMPRESSION: Worsening bilateral airspace disease, right greater than left which could represent asymmetric edema or infection. Small effusions. Electronically Signed   By: Rolm Baptise M.D.   On: 12/22/2016 08:46   Dg Chest Port 1 View  Result Date: 12/21/2016 CLINICAL DATA:  Central line placement EXAM: PORTABLE CHEST 1 VIEW COMPARISON:  12/21/2016 at 6:12 FINDINGS: There is a new left jugular central line, extending to the low SVC. No pneumothorax. Unchanged cardiomegaly. Grossly intact transvenous cardiac leads. Mild improvement, with partial clearance of central/basilar ground-glass opacities. IMPRESSION: 1. New left jugular central line appears satisfactorily positioned. No pneumothorax. 2. Unchanged cardiomegaly. Partial clearance of central and basilar airspace  opacities. Electronically Signed   By: Andreas Newport M.D.   On: 12/21/2016 21:37   Dg Chest Port 1 View  Result Date: 12/21/2016 CLINICAL DATA:  Fever.  Follow-up pulmonary edema. EXAM: PORTABLE CHEST 1 VIEW COMPARISON:  12/19/2016, 12/16/2016 and earlier, including CT chest 11/27/2016. FINDINGS: Cardiac silhouette markedly enlarged, unchanged. Worsening interstitial pulmonary edema since the examination 2 days ago. New consolidation at the right lung base. Bilateral pleural effusions suspected. Feeding tube courses below the diaphragm into the stomach. Left subclavian single lead transvenous pacemaker unchanged and appears intact. IMPRESSION: 1. Worsening CHF and/or fluid overload, with increasing interstitial pulmonary edema and small bilateral pleural effusions. 2. New atelectasis versus pneumonia at the right lung base. Pneumonia is favored, query aspiration. Electronically Signed   By: Evangeline Dakin M.D.   On: 12/21/2016 08:03   Dg Chest Port 1 View  Result Date: 12/19/2016 CLINICAL DATA:  Atelectasis EXAM: PORTABLE CHEST 1 VIEW COMPARISON:  S8 01/07/2017 mean FINDINGS: NG tube has been exchanged for a feeding tube which extends below the hemidiaphragms into the stomach. Single lead left subclavian pacemaker/ defibrillator. Similar cardiomegaly with mild interstitial edema pattern and basilar atelectasis. No enlarging effusion or pneumothorax. No new focal collapse or consolidation. IMPRESSION: Stable cardiomegaly with mild interstitial edema pattern No significant interval change. Electronically Signed   By: Jerilynn Mages.  Shick M.D.   On: 12/19/2016 13:53   Dg Chest Port 1 View  Result Date: 12/16/2016 CLINICAL DATA:  Fever and cough. EXAM: PORTABLE CHEST 1 VIEW COMPARISON:  12/13/2016 . FINDINGS: NG tube noted with its tip coiled and projecting over the stomach. Cardiac pacer with lead tip over the right ventricle. Cardiomegaly with normal pulmonary vascularity. Bibasilar atelectasis. IMPRESSION: 1. NG  tube noted with its tip coiled and projecting over the stomach. 2. Cardiac pacer with lead tip over the right ventricle. Cardiomegaly with normal pulmonary vascular. 3.  Basilar atelectasis. Electronically Signed   By: Marcello Moores  Register   On: 12/16/2016 10:45   Dg Abd Portable 1v  Result Date: 12/14/2016 CLINICAL DATA:  NG tube placement. EXAM: PORTABLE ABDOMEN - 1 VIEW COMPARISON:  CT abdomen pelvis dated December 13, 2016. FINDINGS: NG tube seen with the tip and distal side port in the gastric body. Unchanged mildly dilated loop of air-filled sigmoid colon.  Enteric contrast is noted in the right and left colon. IMPRESSION: 1. Enteric tube in good position in the gastric body. 2. Unchanged focal dilatation of the sigmoid colon. Electronically Signed   By: Titus Dubin M.D.   On: 12/14/2016 16:42   Ct Image Guided Drainage By Percutaneous Catheter  Result Date: 12/24/2016 INDICATION: 71 year old male with postoperative intra-abdominal fluid collection and spontaneous drainage from his midline wound. EXAM: CT GUIDED DRAINAGE OF  ABSCESS MEDICATIONS: The patient is currently admitted to the hospital and receiving intravenous antibiotics. The antibiotics were administered within an appropriate time frame prior to the initiation of the procedure. ANESTHESIA/SEDATION: 1 mg IV Versed 50 mcg IV Fentanyl Moderate Sedation Time:  15 minutes The patient was continuously monitored during the procedure by the interventional radiology nurse under my direct supervision. COMPLICATIONS: None immediate. TECHNIQUE: Informed written consent was obtained from the patient after a thorough discussion of the procedural risks, benefits and alternatives. All questions were addressed. A timeout was performed prior to the initiation of the procedure. PROCEDURE: A planning axial CT scan was performed. The intra-abdominal fluid collection is significantly decreased in size compared to 12/21/2016 likely due to continued drainage through the  midline abdominal wound. There is still sufficient fluid to warrant drain placement. Therefore, the overlying skin was marked and sterilely prepped and draped in standard fashion using chlorhexidine skin prep. Local anesthesia was attained by infiltration with 1% lidocaine. Under intermittent CT guidance, a 18 gauge trocar needle was advanced into the fluid collection. A 0.035 wire was then advanced the needle and coiled within the fluid collection. The skin tract was dilated to 10 [place] and a Cook 10.2 [place] all-purpose drainage catheter was advanced over the wire and positioned in the fluid collection. Aspiration yields approximately 10 mL of serosanguineous fluid. No purulence or foul odor. The catheter was secured to the skin with 0 Prolene suture and connected to JP bulb suction. FINDINGS: Serosanguineous fluid. IMPRESSION: Successful placement of a 10 French drainage catheter. Aspiration yields serosanguineous fluid. A sample was sent for Gram stain and culture. PLAN: Maintain tube to JP bulb suction and flush every shift. Recommend repeat CT scan of the abdomen and pelvis prior to drain removal. Signed, Criselda Peaches, MD [facility] Electronically Signed   By: Jacqulynn Cadet M.D.   On: 12/24/2016 17:31     8/10- ex lap- patch repair of pyloric ucler  8/13- brachial embolectomy  8/12- EF 15%, diffuse hypokinesis and akinesis of inferolateral/inferioir myocardium  9/6- Percutaneous drain  9/7- Echocardiogram: EF of 66-44%, grade 2 diastolic dysfunction. No evidence of vegetations    Subjective: Patient seen and examined at bedside. He is awake and denies any overnight fever, nausea or vomiting.  Discharge Exam: Vitals:   01/12/17 2205 01/13/17 0451  BP: (!) 104/49 (!) 98/52  Pulse: 67 61  Resp: 17 16  Temp: 98.3 F (36.8 C) 97.7 F (36.5 C)  SpO2: 100% 95%   Vitals:   01/12/17 0540 01/12/17 1407 01/12/17 2205  01/13/17 0451  BP: (!) 101/52 (!) 106/45 (!) 104/49 (!) 98/52  Pulse: 70 77 67 61  Resp: _0 Temp: 98.4 F (36.9 C) 98.6 F (37 C) 98.3 F (36.8 C) 97.7 F (36.5 C)  TempSrc: Oral Oral Oral Axillary  SpO2: 100% 100% 100% 95%  Weight:      Height:        General: Pt is alert, awake, not in acute distress. Looks  ill Cardiovascular: Rate controlled, S1/S2 + Respiratory: Bilateral decreased breath sounds at bases with some scattered crackles Abdominal: Soft, NT, midline dressing present with abdominal wound Extremities: Trace edema, no cyanosis    The results of significant diagnostics from this hospitalization (including imaging, microbiology, ancillary and laboratory) are listed below for reference.     Microbiology: No results found for this or any previous visit (from the past 240 hour(s)).   Labs: BNP (last 3 results) No results for input(s): BNP in the last 8760 hours. Basic Metabolic Panel:  Recent Labs Lab 01/07/17 0457 01/08/17 0520 01/09/17 0703 01/11/17 0533  NA 136 135 136 136  K 4.4 4.1 4.5 3.9  CL 104 104 104 104  CO2 _0 GLUCOSE 73 72 78 72  BUN _1 CREATININE 0.73 0.75 0.90 0.80  CALCIUM 8.1* 8.2* 8.2* 8.1*   Liver Function Tests: No results for input(s): AST, ALT, ALKPHOS, BILITOT, PROT, ALBUMIN in the last 168 hours. No results for input(s): LIPASE, AMYLASE in the last 168 hours. No results for input(s): AMMONIA in the last 168 hours. CBC:  Recent Labs Lab 01/10/17 0426 01/11/17 0533  WBC 5.0 4.7  HGB 9.5* 9.8*  HCT 31.1* 31.1*  MCV 104.7* 104.7*  PLT 254 248   Cardiac Enzymes: No results for input(s): CKTOTAL, CKMB, CKMBINDEX, TROPONINI in the last 168 hours. BNP: Invalid input(s): POCBNP CBG:  Recent Labs Lab 01/06/17 1604 01/06/17 1929  GLUCAP 87 91   D-Dimer No results for input(s): DDIMER in the last 72 hours. Hgb A1c No results for input(s): HGBA1C in the last 72 hours. Lipid Profile No  results for input(s): CHOL, HDL, LDLCALC, TRIG, CHOLHDL, LDLDIRECT in the last 72 hours. Thyroid function studies No results for input(s): TSH, T4TOTAL, T3FREE, THYROIDAB in the last 72 hours.  Invalid input(s): FREET3 Anemia work up No results for input(s): VITAMINB12, FOLATE, FERRITIN, TIBC, IRON, RETICCTPCT in the last 72 hours. Urinalysis    Component Value Date/Time   COLORURINE AMBER (A) 01/02/2017 2214   APPEARANCEUR HAZY (A) 01/02/2017 2214   LABSPEC 1.023 01/02/2017 2214   PHURINE 5.0 01/02/2017 2214   GLUCOSEU NEGATIVE 01/02/2017 2214   GLUCOSEU NEGATIVE 09/10/2016 1002   HGBUR MODERATE (A) 01/02/2017 2214   BILIRUBINUR NEGATIVE 01/02/2017 2214   [place] 01/02/2017 2214   PROTEINUR 30 (A) 01/02/2017 2214   UROBILINOGEN >=8.0 (A) 09/10/2016 1002   NITRITE NEGATIVE 01/02/2017 2214   LEUKOCYTESUR NEGATIVE 01/02/2017 2214   Sepsis Labs Invalid input(s): PROCALCITONIN,  WBC,  LACTICIDVEN Microbiology No results found for this or any previous visit (from the past 240 hour(s)).   Time coordinating discharge: 55 minutes  SIGNED:   Aline August, MD  [facility] 01/13/2017, 2:03 PM Pager: [phone]  If 7PM-7AM, please contact night-coverage [email or web address] Password [email or web address]

## 2017-01-13 NOTE — Progress Notes (Signed)
CSW consulted with RNCM. Patient being discharged to [facility]. CSW signing off as no SNF placement needed.  Abigail Butts, LCSWA [phone]

## 2017-01-13 NOTE — Progress Notes (Signed)
Report given to [facility], LPN at [facility].

## 2017-01-13 NOTE — Care Management Note (Signed)
Case Management Note  Patient Details  Name: Jacob Pittman MRN: [id number] Date of Birth: 08/01/1945  Subjective/Objective:                    Action/Plan: Received approval for [facility]. Patient and daughter Vikki Ports in agreement. [facility] can receive patient today , however will be " a couple of hours before know MD name and room number".   [facility] contact [employer] [phone].   Attn MD also aware.  Expected Discharge Date:   (UNKNOWN)               Expected Discharge Plan:  Long Term Acute Care (LTAC)  In-House Referral:  Clinical Social Work  Discharge planning Services  CM Consult  Post Acute Care Choice:    Choice offered to:  Patient, Adult Children  DME Arranged:    DME Agency:     HH Arranged:    HH Agency:     Status of Service:  Completed, signed off  If discussed at [employer], dates discussed:    Additional Comments:  Kingsley Plan, RN 01/13/2017, 12:53 PM

## 2017-01-14 ENCOUNTER — Encounter: Payer: Medicare PPO | Admitting: *Deleted

## 2017-01-14 NOTE — Telephone Encounter (Signed)
3rd attempt  Attempted to call pt in regards to disconnected monitor. No answer and unable to leave a message. Will send a letter.

## 2017-01-15 ENCOUNTER — Encounter: Payer: Self-pay | Admitting: Cardiology

## 2017-01-19 ENCOUNTER — Telehealth: Payer: Self-pay | Admitting: Cardiology

## 2017-01-19 NOTE — Telephone Encounter (Signed)
Daughter says pt is in [facility] room 323. She wants to know if Dr Swaziland or one of his partner please make rounds on him at [facility] please.

## 2017-01-19 NOTE — Telephone Encounter (Signed)
Pt of Dr. Swaziland  Spoke to daughter. Pt is now at [facility] after a ~60 day stay at [facility].  Daughter reports concern that he has a defibrillator which will need to be monitored. Cites concern of "bacteria growing on the wires" of this device.  I have made her aware that we don't have a rounding group at this hospital, but I would communicate her concerns to Dr. Swaziland and follow up with any recommendations. I did make suggestion to communicate concerns to hospital staff. Routed to MD

## 2017-01-20 NOTE — Telephone Encounter (Signed)
Dr. Elvis Coil recommendations relayed to caller, who verbalized understanding and thanks.

## 2017-01-20 NOTE — Telephone Encounter (Signed)
Looks like he was seen by EP team in hospital - Dr. Johney Frame. Defibrillator assessed then. Also seen by infectious disease and no concern for infection of defibrillator at that time. He does not need cardiology follow up at [facility].  Felicidad Sugarman Sw[place] MD, [facility]

## 2017-02-19 ENCOUNTER — Encounter (HOSPITAL_COMMUNITY): Payer: Self-pay | Admitting: Internal Medicine

## 2017-02-19 ENCOUNTER — Inpatient Hospital Stay (HOSPITAL_COMMUNITY)
Admission: AD | Admit: 2017-02-19 | Discharge: 2017-02-26 | DRG: 919 | Disposition: A | Discharge status: Hospice | Payer: Medicare PPO | Source: Other Acute Inpatient Hospital | Attending: Internal Medicine | Admitting: Internal Medicine

## 2017-02-19 DIAGNOSIS — I739 Peripheral vascular disease, unspecified: Secondary | ICD-10-CM | POA: Diagnosis present

## 2017-02-19 DIAGNOSIS — L89622 Pressure ulcer of left heel, stage 2: Secondary | ICD-10-CM | POA: Diagnosis present

## 2017-02-19 DIAGNOSIS — Z8711 Personal history of peptic ulcer disease: Secondary | ICD-10-CM | POA: Diagnosis not present

## 2017-02-19 DIAGNOSIS — E785 Hyperlipidemia, unspecified: Secondary | ICD-10-CM | POA: Diagnosis present

## 2017-02-19 DIAGNOSIS — R1310 Dysphagia, unspecified: Secondary | ICD-10-CM | POA: Diagnosis not present

## 2017-02-19 DIAGNOSIS — N320 Bladder-neck obstruction: Secondary | ICD-10-CM | POA: Diagnosis present

## 2017-02-19 DIAGNOSIS — I742 Embolism and thrombosis of arteries of the upper extremities: Secondary | ICD-10-CM | POA: Diagnosis not present

## 2017-02-19 DIAGNOSIS — J9690 Respiratory failure, unspecified, unspecified whether with hypoxia or hypercapnia: Secondary | ICD-10-CM | POA: Diagnosis present

## 2017-02-19 DIAGNOSIS — Y838 Other surgical procedures as the cause of abnormal reaction of the patient, or of later complication, without mention of misadventure at the time of the procedure: Secondary | ICD-10-CM | POA: Diagnosis present

## 2017-02-19 DIAGNOSIS — K265 Chronic or unspecified duodenal ulcer with perforation: Secondary | ICD-10-CM | POA: Diagnosis present

## 2017-02-19 DIAGNOSIS — Z9689 Presence of other specified functional implants: Secondary | ICD-10-CM

## 2017-02-19 DIAGNOSIS — J90 Pleural effusion, not elsewhere classified: Secondary | ICD-10-CM | POA: Diagnosis not present

## 2017-02-19 DIAGNOSIS — L89154 Pressure ulcer of sacral region, stage 4: Secondary | ICD-10-CM | POA: Diagnosis present

## 2017-02-19 DIAGNOSIS — Z8614 Personal history of Methicillin resistant Staphylococcus aureus infection: Secondary | ICD-10-CM

## 2017-02-19 DIAGNOSIS — B370 Candidal stomatitis: Secondary | ICD-10-CM | POA: Diagnosis not present

## 2017-02-19 DIAGNOSIS — Z7189 Other specified counseling: Secondary | ICD-10-CM

## 2017-02-19 DIAGNOSIS — M1711 Unilateral primary osteoarthritis, right knee: Secondary | ICD-10-CM | POA: Diagnosis present

## 2017-02-19 DIAGNOSIS — I110 Hypertensive heart disease with heart failure: Secondary | ICD-10-CM | POA: Diagnosis not present

## 2017-02-19 DIAGNOSIS — F419 Anxiety disorder, unspecified: Secondary | ICD-10-CM | POA: Diagnosis not present

## 2017-02-19 DIAGNOSIS — I5043 Acute on chronic combined systolic (congestive) and diastolic (congestive) heart failure: Secondary | ICD-10-CM | POA: Diagnosis present

## 2017-02-19 DIAGNOSIS — T148XXA Other injury of unspecified body region, initial encounter: Secondary | ICD-10-CM

## 2017-02-19 DIAGNOSIS — Z436 Encounter for attention to other artificial openings of urinary tract: Secondary | ICD-10-CM | POA: Diagnosis not present

## 2017-02-19 DIAGNOSIS — Z66 Do not resuscitate: Secondary | ICD-10-CM | POA: Diagnosis present

## 2017-02-19 DIAGNOSIS — I878 Other specified disorders of veins: Secondary | ICD-10-CM | POA: Diagnosis present

## 2017-02-19 DIAGNOSIS — E43 Unspecified severe protein-calorie malnutrition: Secondary | ICD-10-CM | POA: Diagnosis not present

## 2017-02-19 DIAGNOSIS — Z6824 Body mass index (BMI) 24.0-24.9, adult: Secondary | ICD-10-CM

## 2017-02-19 DIAGNOSIS — R627 Adult failure to thrive: Secondary | ICD-10-CM | POA: Diagnosis present

## 2017-02-19 DIAGNOSIS — T859XXD Unspecified complication of internal prosthetic device, implant and graft, subsequent encounter: Secondary | ICD-10-CM | POA: Diagnosis not present

## 2017-02-19 DIAGNOSIS — I480 Paroxysmal atrial fibrillation: Secondary | ICD-10-CM | POA: Diagnosis present

## 2017-02-19 DIAGNOSIS — K632 Fistula of intestine: Secondary | ICD-10-CM | POA: Diagnosis present

## 2017-02-19 DIAGNOSIS — Y828 Other medical devices associated with adverse incidents: Secondary | ICD-10-CM | POA: Diagnosis present

## 2017-02-19 DIAGNOSIS — E1151 Type 2 diabetes mellitus with diabetic peripheral angiopathy without gangrene: Secondary | ICD-10-CM | POA: Diagnosis present

## 2017-02-19 DIAGNOSIS — E46 Unspecified protein-calorie malnutrition: Secondary | ICD-10-CM | POA: Diagnosis present

## 2017-02-19 DIAGNOSIS — Z515 Encounter for palliative care: Secondary | ICD-10-CM | POA: Diagnosis not present

## 2017-02-19 DIAGNOSIS — Z79899 Other long term (current) drug therapy: Secondary | ICD-10-CM

## 2017-02-19 DIAGNOSIS — R5381 Other malaise: Secondary | ICD-10-CM | POA: Diagnosis not present

## 2017-02-19 DIAGNOSIS — R0989 Other specified symptoms and signs involving the circulatory and respiratory systems: Secondary | ICD-10-CM

## 2017-02-19 DIAGNOSIS — E1142 Type 2 diabetes mellitus with diabetic polyneuropathy: Secondary | ICD-10-CM | POA: Diagnosis present

## 2017-02-19 DIAGNOSIS — I428 Other cardiomyopathies: Secondary | ICD-10-CM | POA: Diagnosis present

## 2017-02-19 DIAGNOSIS — Z7901 Long term (current) use of anticoagulants: Secondary | ICD-10-CM

## 2017-02-19 DIAGNOSIS — L89620 Pressure ulcer of left heel, unstageable: Secondary | ICD-10-CM | POA: Diagnosis present

## 2017-02-19 DIAGNOSIS — R0600 Dyspnea, unspecified: Secondary | ICD-10-CM | POA: Diagnosis present

## 2017-02-19 DIAGNOSIS — T859XXA Unspecified complication of internal prosthetic device, implant and graft, initial encounter: Principal | ICD-10-CM | POA: Diagnosis present

## 2017-02-19 DIAGNOSIS — I5042 Chronic combined systolic (congestive) and diastolic (congestive) heart failure: Secondary | ICD-10-CM | POA: Diagnosis present

## 2017-02-19 DIAGNOSIS — G6281 Critical illness polyneuropathy: Secondary | ICD-10-CM | POA: Diagnosis present

## 2017-02-19 DIAGNOSIS — I82621 Acute embolism and thrombosis of deep veins of right upper extremity: Secondary | ICD-10-CM | POA: Diagnosis not present

## 2017-02-19 DIAGNOSIS — L89150 Pressure ulcer of sacral region, unstageable: Secondary | ICD-10-CM | POA: Diagnosis not present

## 2017-02-19 DIAGNOSIS — L089 Local infection of the skin and subcutaneous tissue, unspecified: Secondary | ICD-10-CM | POA: Diagnosis not present

## 2017-02-19 DIAGNOSIS — K269 Duodenal ulcer, unspecified as acute or chronic, without hemorrhage or perforation: Secondary | ICD-10-CM | POA: Diagnosis present

## 2017-02-19 DIAGNOSIS — Z95810 Presence of automatic (implantable) cardiac defibrillator: Secondary | ICD-10-CM

## 2017-02-19 DIAGNOSIS — Z4801 Encounter for change or removal of surgical wound dressing: Secondary | ICD-10-CM | POA: Diagnosis not present

## 2017-02-19 DIAGNOSIS — Z87891 Personal history of nicotine dependence: Secondary | ICD-10-CM

## 2017-02-19 DIAGNOSIS — Z9889 Other specified postprocedural states: Secondary | ICD-10-CM

## 2017-02-19 HISTORY — DX: Unspecified multiple injuries, initial encounter: T07.XXXA

## 2017-02-19 MED ORDER — SODIUM CHLORIDE 0.9% FLUSH: 10.0000 mL | INTRAVENOUS | Status: DC | PRN

## 2017-02-19 MED ORDER — HYDROMORPHONE BOLUS VIA INFUSION
0.2000 mg | INTRAVENOUS | Status: DC | PRN
  Filled 2017-02-19: qty 1

## 2017-02-19 MED ORDER — HYDROMORPHONE HCL 1 MG/ML IJ SOLN
0.2500 mg | INTRAMUSCULAR | Status: DC | PRN
  Administered 2017-02-20 – 2017-02-26 (×8): 0.25 mg via INTRAVENOUS
  Filled 2017-02-19 (×8): qty 1

## 2017-02-19 MED ORDER — FUROSEMIDE 10 MG/ML IJ SOLN
40.0000 mg | Freq: Every day | INTRAMUSCULAR | Status: DC
  Administered 2017-02-19 – 2017-02-20 (×2): 40 mg via INTRAVENOUS
  Filled 2017-02-19 (×2): qty 4

## 2017-02-19 MED ORDER — BIOTENE DRY MOUTH MT LIQD: 15.0000 mL | OROMUCOSAL | Status: DC | PRN

## 2017-02-19 MED ORDER — ONDANSETRON HCL 4 MG/2ML IJ SOLN: 4.0000 mg | Freq: Four times a day (QID) | INTRAMUSCULAR | Status: DC | PRN

## 2017-02-19 MED ORDER — SODIUM CHLORIDE 0.9 % IV SOLN
0.2000 mg/h | INTRAVENOUS | Status: DC
  Administered 2017-02-19: 0.2 mg/h via INTRAVENOUS
  Filled 2017-02-19: qty 5

## 2017-02-19 MED ORDER — IPRATROPIUM-ALBUTEROL 0.5-2.5 (3) MG/3ML IN SOLN
3.0000 mL | Freq: Four times a day (QID) | RESPIRATORY_TRACT | Status: DC | PRN
Start: 2017-02-19 — End: 2017-02-26

## 2017-02-19 MED ORDER — MIRTAZAPINE 15 MG PO TBDP
15.0000 mg | ORAL_TABLET | Freq: Every day | ORAL | Status: DC
  Administered 2017-02-19 – 2017-02-25 (×7): 15 mg via ORAL
  Filled 2017-02-19 (×7): qty 1

## 2017-02-19 MED ORDER — LORAZEPAM 2 MG/ML IJ SOLN: 0.5000 mg | INTRAMUSCULAR | Status: DC | PRN

## 2017-02-19 MED ORDER — COLLAGENASE 250 UNIT/GM EX OINT
TOPICAL_OINTMENT | Freq: Every day | CUTANEOUS | Status: DC
  Administered 2017-02-20 – 2017-02-22 (×2): via TOPICAL
  Administered 2017-02-23: 1 via TOPICAL
  Administered 2017-02-24 – 2017-02-25 (×2): via TOPICAL
  Filled 2017-02-19: qty 90

## 2017-02-19 MED ORDER — APIXABAN 5 MG PO TABS
5.0000 mg | ORAL_TABLET | Freq: Two times a day (BID) | ORAL | Status: DC
  Administered 2017-02-19 – 2017-02-26 (×14): 5 mg via ORAL
  Filled 2017-02-19 (×14): qty 1

## 2017-02-19 MED ORDER — LORAZEPAM 2 MG/ML IJ SOLN: 1.0000 mg | INTRAMUSCULAR | Status: DC | PRN

## 2017-02-19 MED ORDER — ONDANSETRON 4 MG PO TBDP: 4.0000 mg | ORAL_TABLET | Freq: Four times a day (QID) | ORAL | Status: DC | PRN

## 2017-02-19 MED ORDER — ACETAMINOPHEN 500 MG PO TABS
1000.0000 mg | ORAL_TABLET | Freq: Three times a day (TID) | ORAL | Status: DC
  Administered 2017-02-19 – 2017-02-26 (×20): 1000 mg via ORAL
  Filled 2017-02-19 (×21): qty 2

## 2017-02-19 MED ORDER — DOXYCYCLINE HYCLATE 100 MG PO TABS
100.0000 mg | ORAL_TABLET | Freq: Two times a day (BID) | ORAL | Status: DC
  Administered 2017-02-19 – 2017-02-26 (×14): 100 mg via ORAL
  Filled 2017-02-19 (×14): qty 1

## 2017-02-19 MED ORDER — SENNOSIDES-DOCUSATE SODIUM 8.6-50 MG PO TABS
1.0000 | ORAL_TABLET | Freq: Every day | ORAL | Status: DC
  Administered 2017-02-19 – 2017-02-25 (×7): 1 via ORAL
  Filled 2017-02-19 (×7): qty 1

## 2017-02-19 MED ORDER — SODIUM CHLORIDE 0.9 % IV SOLN
INTRAVENOUS | Status: DC
  Administered 2017-02-19: 20 mL via INTRAVENOUS
  Administered 2017-02-21 (×2): via INTRAVENOUS

## 2017-02-19 MED ORDER — SODIUM CHLORIDE 0.9% FLUSH
10.0000 mL | Freq: Two times a day (BID) | INTRAVENOUS | Status: DC
  Administered 2017-02-19: 20 mL
  Administered 2017-02-20 – 2017-02-23 (×7): 10 mL
  Administered 2017-02-24: 20 mL
  Administered 2017-02-25: 30 mL
  Administered 2017-02-25: 20 mL
  Administered 2017-02-26: 30 mL

## 2017-02-19 NOTE — H&P (Signed)
Palliative Care Admission  Narrative and Interval Hx: Jacob Pittman is an 71 y.o. male with multipme chronic medical problems and a recent prolonged hospitalization 8/9- 9/26 resulting from complications from a perforated gastric ulcer. He had a prolonged critical illness including acute post operative respiratory failure, MRSA bacteremia with previously implanted AICD and required mechanical ventilation, worsening heart failure from volume overload, ICU delirium and difficulty with swallowing and PO intake. At the time of initial palliative consultation Jacob Pittman was felt to be so critically ill that he would not survive- but he steadily improved despite his odds and overall poor prognosis. I met with the patient and his family on several occasions and established a good rapport and relationship with them- it was indeed difficult to predict his course because prior to this admission he was functional and independent, but he was severely deconditioned and faced huge challenges if recovery were going to be possible.  Pressure to discharge from [facility] along with his medical complexity and uncertain prognosis/ illness course was challenging. The complexity of his needs was too much for our local SNFs and he was refused by all of our preferred facilities - I did not feel SNF was the appropriate discharge for him - I actually appealed to his insurance provider to get [facility] approved because he had shown great progress and their goals were not to transition to complete comfort care when he was discharge from hospital. I made a clear plan with them that if he got worse or declined that based on his previously stated wishes that a shift to comfort and hospice would be appropriate.   I received a call from his daughter today stating that she [facility] a discussion about hospice that he "couldnt take anymore" and she wanted his wounds evaluated and his chest tube managed and removed. [facility]  requested a transfer to [facility] on 10/31 that was declined by [facility]. She reported that her dad was much worse and she was desperate for help and guidance. I agreed to accept him in transfer and be his attending to facilitate a hospice transition- palliative had not seen her at [facility] and she didn't know what her options were. I coordinated the transfer and spoke with their physician Dr. Ahmed Prima and communicated with the [facility] CEO   HPI: When Jacob Pittman arrived he was visibly weaker and thinner but was still mentally very sharp and alert- he rarely complained of pain on his last admission and was usually very stoic. He is had multiple new drains, a wound vac and also a left side chest tube that causes him significant pain but only when he moves- he also reports severe shortness of breath and inability to bear weight on his right leg. When I walked in the room he said I know Im not getting better- When I asked him if he had "had enough" he said well they haven't done "enough" to help me-"none of this is helping me"- he wants to be able to put weight on his legs and he has no appetite -he also tells me he doesn't want to go on like this if it cant get better. I spoke with him directly and candidly about this and that I doubt recovery is possible-especially wound healing. He clearly wishes he could get better and is dealing with his current state which is extremely fragile and  terminally ill. 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  Past Medical History:  Diagnosis Date  . ANXIETY 02/04/2009  . CHF (congestive heart failure) ([place])   . Chronic systolic CHF (congestive heart failure) ([place])   . DIABETES MELLITUS, TYPE II 11/07/2008  . 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 HIP PAIN, RIGHT 11/06/2009  . HYPERLIPIDEMIA 11/07/2008  . HYPERTENSION 10/08/2008  . Multiple wounds   . Nonischemic cardiomyopathy [facility])    s/p ICD  implantation 03-2013 by Dr Lovena Le  . OSTEOARTHRITIS, KNEE, RIGHT 10/08/2008  . Peripheral arterial disease [facility])     Past Surgical History:  Procedure Laterality Date  . CARDIAC DEFIBRILLATOR PLACEMENT Left 04/10/13   BTK single chamber ICD implanted by Dr Lovena Le for primary prevention  . EMBOLECTOMY Right 11/30/2016   Procedure: BRACHIAL EMBOLECTOMY WITH VEIN PATCH ANGIOPLASTY;  Surgeon: Elam Dutch, MD;  Location: [place];  Service: Vascular;  Laterality: Right;  . IMPLANTABLE CARDIOVERTER DEFIBRILLATOR IMPLANT N/A 04/10/2013   Procedure: IMPLANTABLE CARDIOVERTER DEFIBRILLATOR IMPLANT;  Surgeon: Evans Lance, MD;  Location: [facility] CATH LAB;  Service: Cardiovascular;  Laterality: N/A;  . LAPAROTOMY N/A 11/27/2016   Procedure: EXPLORATORY LAPAROTOMY abdominal exploration oversew pyloric channel ulcer gram patch repair perforated ulcer;  Surgeon: Alphonsa Overall, MD;  Location: [facility];  Service: General;  Laterality: N/A;  . s/p left broken arm with pin     1980's    Family History  Problem Relation Age of Onset  . Diabetes Mother   . Hypertension Mother    Social History:  reports that he has quit smoking. He has never used smokeless tobacco. He reports that he drinks alcohol. He reports that he does not use drugs.  Allergies: No Known Allergies  Medications Prior to Admission  Medication Sig Dispense Refill  . acetaminophen (TYLENOL) 500 MG tablet Take 2 tablets (1,000 mg total) by mouth 3 (three) times daily. 30 tablet 0  . albuterol (PROVENTIL HFA;VENTOLIN HFA) 108 (90 BASE) MCG/ACT inhaler Inhale 2 puffs into the lungs every 6 (six) hours as needed for wheezing or shortness of breath. 1 Inhaler 5  . apixaban (ELIQUIS) 5 MG TABS tablet Take 1 tablet (5 mg total) by mouth every 12 (twelve) hours. 60 tablet 0  . collagenase (SANTYL) ointment Apply topically daily. 15 g 0  . docusate sodium (COLACE) 100 MG capsule Take 1 capsule (100 mg total) by mouth daily as needed for mild  constipation. 10 capsule 0  . doxycycline (VIBRA-TABS) 100 MG tablet Take 1 tablet (100 mg total) by mouth every 12 (twelve) hours. 60 tablet 0  . feeding supplement, ENSURE ENLIVE, (ENSURE ENLIVE) LIQD Take 237 mLs by mouth 2 (two) times daily between meals. 237 mL 12  . fluticasone (FLONASE) 50 MCG/ACT nasal spray Place 2 sprays into both nostrils daily. 16 g 2  . furosemide (LASIX) 20 MG tablet Take 10 mg by mouth daily.    . midodrine (PROAMATINE) 2.5 MG tablet Take 1 tablet (2.5 mg total) by mouth 3 (three) times daily with meals. 90 tablet 0  . ondansetron (ZOFRAN-ODT) 4 MG disintegrating tablet Take 1 tablet (4 mg total) by mouth every 6 (six) hours as needed for nausea. 20 tablet 0  . OxyCODONE HCl, Abuse Deter, (OXAYDO) 5 MG TABA Take 5 mg by mouth every 4 (four) hours as needed (PAIN).    Marland Kitchen pantoprazole (PROTONIX) 40 MG tablet Take 1 tablet (40 mg total) by mouth 2 (two) times daily before a meal. 60 tablet 0  . rosuvastatin (  CRESTOR) 20 MG tablet Take 1 tablet (20 mg total) by mouth daily. 90 tablet 3  . Blood Glucose Monitoring Suppl (ONE TOUCH ULTRA SYSTEM KIT) w/Device KIT Use as directed daily 1 each 0  . glucose blood test strip Use as instructed 100 each 12  . Lancets MISC Use as directed once daily 100 each 12    No results found for this or any previous visit (from the past 48 hour(s)). No results found.  Review of Systems  Constitutional: Positive for malaise/fatigue and weight loss.  Cardiovascular: Positive for orthopnea, claudication and leg swelling.  Gastrointestinal: Positive for nausea.  Genitourinary: Positive for hematuria.  Musculoskeletal: Positive for joint pain.  Neurological: Positive for sensory change and weakness.  Psychiatric/Behavioral: Positive for depression and memory loss.    Blood pressure 105/74, pulse 81, temperature 97.6 F (36.4 C), temperature source Oral, resp. rate 16, SpO2 98 %. Physical Exam  Constitutional: He is oriented to person,  place, and time.  Mildly distressed with moderate to severe tachypnea  HENT:  Head: Normocephalic.  Eyes: Pupils are equal, round, and reactive to light. Left eye exhibits no discharge. No scleral icterus.  Neck: Normal range of motion. JVD present.  Cardiovascular:  Irregular, rate is normal, +SEM, very weak difficult to palpate pulses but has warm feet and refill  Respiratory: He is in respiratory distress.  Tachypneic, minimal exertion, has chest tube inserted into his left chest wall - pleur-evac has significant drainage.  GI:  Ostomy pouch over his wound, wound vac- dressings in place  Genitourinary:  Genitourinary Comments: Foley catheter  Musculoskeletal:  Pre-sacral edema  Neurological: He is alert and oriented to person, place, and time.  Skin: There is pallor.  Psychiatric: He has a normal mood and affect. His behavior is normal. Judgment and thought content normal.     Assessment/Plan  Plan is to do a complete assessment of his condition and develop a plan for transition to a more palliative approach- he will likely qualify for admission to a hospice facility.  1. 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  Requested WOC consult- would appreciate palliative wound care recommendations and also a comment on wound trajectory and any potential or non-potential for healing. Ex. Would this require surgery- he would never survive surgery now or in the future.My goal is to give an honest assessment of his poor prognosis in terms of wound healing and plan for a better QOL.  Oral Doxycycline for MRSA suppression and chronic wound infection.  Mattress overlay ordered  2. Respiratory Failure, Combined Acute systolic and diastolic heart failure. Prior EF 25% He has known A-Fib and is on Eloquis. He was diagnosed with large left chest pleural effusion. Chest tube was placed at [facility]- high output but appearance improved  [facility].   Will diurese with Lasix for his comfort  Keep Chest tube to gravity/WS on Pleur-evac  Will repeat a CXR in AM  Will consult Pulmonary to advise on chest tube and remove if possible- if not will have to see if hospice would take a Pleur-evac  I reviewed Chest CT from [facility]- concerning process in left lower lung base.  Will treat dyspnea with PRN opioids for comfort  Nebs as needed  3. AICD/Docycycline indefinitely/unclear status patient has DNR order  Will discuss with patient and family and if still on will have AICD part turned off   4. Anticogulation/ VTE in his right arm  Continue Eloquis- VTE and A Fib  5. Protein Calorie Malnutrition and Weight Loss  Minimal PO intake not sustaining his nutrition  No artificial feeding  Increased Mirtazapine to 15 qhs  KNown dysphagia -requires full assistance with meals  6. Symptoms: Pain, Dyspnea and Generalized misery, existential suffering with "dying"   PRN hydromorphone for dyspnea and pain  Chaplain consult, provided supportive conversation at bedside.  7. Bowel regimen and palliative prophylaxis ordered.  8. DNR on chart   Disposition: Once we deal with chest tubes and AICD and assess his wounds- I am going to recommend a hospice facility for his care. His prognosis could be < 2 weeks and he is very fragile.  Lane Hacker, DO Palliative Medicine Cell: [phone]       [facility], DO 02/19/2017, 8:00 PM

## 2017-02-19 NOTE — Progress Notes (Signed)
Patient's O2 Sat in the 60s-70s,  oxygen liters per minute increased to 4. O2 Sat 100%. Dr. Phillips Odor called and notified.

## 2017-02-20 ENCOUNTER — Inpatient Hospital Stay (HOSPITAL_COMMUNITY): Payer: Medicare PPO

## 2017-02-20 NOTE — Progress Notes (Signed)
This RN Wasted 85 mL = 42.5 mg dilaudid in sink, witnessed by Sharol Roussel.

## 2017-02-20 NOTE — Progress Notes (Signed)
Went to apply chest tube draining systems to pleurex drains to waterseal. The left side pleurex was disconnected from pleurex drain container. Told Fred to notify MD in regards to getting a chest x-ray.

## 2017-02-20 NOTE — Progress Notes (Signed)
Pt was lying in bed, awake and alert when [facility] arrived. He was soft spoken in his voice. He said he was doing alright. He mentioned that he has a daughter, but did not talk about other family members. Pt said he would be doing better if he could get up and walk. Pt said he has not eaten much here even thought they bring it. He said he ate at the other place, to which he referred to as the old [facility]. Pt was grateful for visit but denied any need for a visit at this point. Please page if assistance is needed for pt or his family. Chaplain Marjory Lies, MDiv   02/20/17 1400  Clinical Encounter Type  Visited With Patient

## 2017-02-20 NOTE — Progress Notes (Signed)
Notified MD McQuaid in regards to both chest tubes to be placed to water seal. MD McQuaid verified that both need to be placed to water seal.

## 2017-02-20 NOTE — Progress Notes (Signed)
[facility]  Discussed situation with Dr. Phillips Odor.  Left sided pleural chest drain/tube in place, undetermined reason. Goals of care are comfort.   Plan: chest tube to water seal, if no fluid output overnight and no airleak will remove tomorrow  Heber Empire, MD [place] [facility] Pager: [phone] Cell: [phone] After 3pm or if no response, call [phone]

## 2017-02-21 DIAGNOSIS — J90 Pleural effusion, not elsewhere classified: Secondary | ICD-10-CM

## 2017-02-21 DIAGNOSIS — T859XXA Unspecified complication of internal prosthetic device, implant and graft, initial encounter: Principal | ICD-10-CM

## 2017-02-21 LAB — COMPREHENSIVE METABOLIC PANEL
ALT: 19 U/L (ref 17–63)
ANION GAP: 9 (ref 5–15)
AST: 21 U/L (ref 15–41)
Albumin: 2.5 g/dL — ABNORMAL LOW (ref 3.5–5.0)
Alkaline Phosphatase: 110 U/L (ref 38–126)
BUN: 35 mg/dL — ABNORMAL HIGH (ref 6–20)
CHLORIDE: 101 mmol/L (ref 101–111)
CO2: 28 mmol/L (ref 22–32)
Calcium: 8.3 mg/dL — ABNORMAL LOW (ref 8.9–10.3)
Creatinine, Ser: 1.14 mg/dL (ref 0.61–1.24)
Glucose, Bld: 94 mg/dL (ref 65–99)
POTASSIUM: 3.5 mmol/L (ref 3.5–5.1)
Sodium: 138 mmol/L (ref 135–145)
Total Bilirubin: 0.8 mg/dL (ref 0.3–1.2)
Total Protein: 5.2 g/dL — ABNORMAL LOW (ref 6.5–8.1)

## 2017-02-21 LAB — CBC
HEMATOCRIT: 32.4 % — AB (ref 39.0–52.0)
HEMOGLOBIN: 10.4 g/dL — AB (ref 13.0–17.0)
MCH: 33.3 pg (ref 26.0–34.0)
MCHC: 32.1 g/dL (ref 30.0–36.0)
MCV: 103.8 fL — AB (ref 78.0–100.0)
Platelets: 253 10*3/uL (ref 150–400)
RBC: 3.12 MIL/uL — AB (ref 4.22–5.81)
RDW: 16.9 % — ABNORMAL HIGH (ref 11.5–15.5)
WBC: 5.1 10*3/uL (ref 4.0–10.5)

## 2017-02-21 LAB — MAGNESIUM: Magnesium: 2.1 mg/dL (ref 1.7–2.4)

## 2017-02-21 MED ORDER — FUROSEMIDE 20 MG PO TABS
20.0000 mg | ORAL_TABLET | Freq: Every day | ORAL | Status: DC
  Administered 2017-02-21 – 2017-02-22 (×2): 20 mg via ORAL
  Filled 2017-02-21 (×2): qty 1

## 2017-02-21 NOTE — Progress Notes (Signed)
MD McQuaid ordered for right chest tube to be removed. Removed right chest tube at 1130. Patient had no distress or change in respiratory status. Patient had some serous fluid drain from site. Vaseline gauze dressing applied over site. Notified Oralia Rud to assess frequently.

## 2017-02-21 NOTE — Consult Note (Signed)
PULMONARY / CRITICAL CARE MEDICINE   Name: Jacob Pittman MRN: [id number] DOB: 1946-03-16    ADMISSION DATE:  02/19/2017 CONSULTATION DATE:  02/21/2017  REFERRING MD:  Hilma Favors  CHIEF COMPLAINT:  Chest tube management  HISTORY OF PRESENT ILLNESS:   71 y/o male had a prolonged hospitalization in 11/2016 through 12/2016 at [facility] from a perforated gastric ulcer. He had many compilations including respiratory failure, MRSA bacteremia, delirium, and heart failure.  He was eventually discharged to an LTAC for further management.  Dr. Hilma Favors had been involved in his care and set a plan in place for palliation if his condition worsened.  At the [facility] he became weak, and had multiple tubes and lines placed including bilateral pleural drainage catheters.  PCCM consulted for evaluation of the same.   He reports some pain when he lay on the pleural catheters, but otherwise they don't bother him.  No dyspnea today.    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  PAST SURGICAL HISTORY: He  has a past surgical history that includes s/p left broken arm with pin; Cardiac defibrillator placement (Left, 04/10/13); BRACHIAL EMBOLECTOMY WITH VEIN PATCH ANGIOPLASTY (Right, 11/30/2016); EXPLORATORY LAPAROTOMY abdominal exploration oversew pyloric channel ulcer gram patch repair perforated ulcer (N/A, 11/27/2016); and IMPLANTABLE CARDIOVERTER DEFIBRILLATOR IMPLANT (N/A, 04/10/2013).  No Known Allergies  No current facility-administered medications on file prior to encounter.    Current Outpatient  Medications on File Prior to Encounter  Medication Sig  . acetaminophen (TYLENOL) 500 MG tablet Take 2 tablets (1,000 mg total) by mouth 3 (three) times daily.  Marland Kitchen albuterol (PROVENTIL HFA;VENTOLIN HFA) 108 (90 BASE) MCG/ACT inhaler Inhale 2 puffs into the lungs every 6 (six) hours as needed for wheezing or shortness of breath.  Marland Kitchen apixaban (ELIQUIS) 5 MG TABS tablet Take 1 tablet (5 mg total) by mouth every 12 (twelve) hours.  . collagenase (SANTYL) ointment Apply topically daily.  Marland Kitchen docusate sodium (COLACE) 100 MG capsule Take 1 capsule (100 mg total) by mouth daily as needed for mild constipation.  Marland Kitchen doxycycline (VIBRA-TABS) 100 MG tablet Take 1 tablet (100 mg total) by mouth every 12 (twelve) hours.  . feeding supplement, ENSURE ENLIVE, (ENSURE ENLIVE) LIQD Take 237 mLs by mouth 2 (two) times daily between meals.  . fluticasone (FLONASE) 50 MCG/ACT nasal spray Place 2 sprays into both nostrils daily.  . furosemide (LASIX) 20 MG tablet Take 10 mg by mouth daily.  . midodrine (PROAMATINE) 2.5 MG tablet Take 1 tablet (2.5 mg total) by mouth 3 (three) times daily with meals.  . ondansetron (ZOFRAN-ODT) 4 MG disintegrating tablet Take 1 tablet (4 mg total) by mouth every 6 (six) hours as needed for nausea.  . OxyCODONE HCl, Abuse Deter, (OXAYDO) 5 MG TABA Take 5 mg by mouth every 4 (four) hours as needed (PAIN).  Marland Kitchen pantoprazole (PROTONIX) 40 MG tablet Take 1 tablet (40 mg total) by mouth 2 (two) times daily before a meal.  . rosuvastatin (CRESTOR) 20 MG tablet Take 1 tablet (20 mg total) by mouth daily.  . Blood Glucose Monitoring Suppl (ONE TOUCH ULTRA SYSTEM KIT) w/Device KIT Use as directed daily  . glucose blood test strip Use as instructed  . Lancets  MISC Use as directed once daily    FAMILY HISTORY:  His indicated that his mother is deceased. He indicated that his father is deceased. He indicated that his sister is alive. He indicated that his brother is alive. He indicated that his maternal  grandmother is deceased. He indicated that his maternal grandfather is deceased. He indicated that his paternal grandmother is deceased. He indicated that his paternal grandfather is deceased.   SOCIAL HISTORY: He  reports that he has quit smoking. he has never used smokeless tobacco. He reports that he drinks alcohol. He reports that he does not use drugs.  REVIEW OF SYSTEMS:   Gen: Denies fever, chills, weight change, + fatigue, night sweats HEENT: Denies blurred vision, double vision, hearing loss, tinnitus, sinus congestion, rhinorrhea, sore throat, neck stiffness, dysphagia PULM: per HPI CV: Denies chest pain, edema, orthopnea, paroxysmal nocturnal dyspnea, palpitations GI: Denies abdominal pain, nausea, vomiting, diarrhea, hematochezia, melena, constipation, change in bowel habits GU: Denies dysuria, hematuria, polyuria, oliguria, urethral discharge Endocrine: Denies hot or cold intolerance, polyuria, polyphagia or appetite change Derm: Denies rash, dry skin, scaling or peeling skin change Heme: Denies easy bruising, bleeding, bleeding gums Neuro: Denies headache, numbness, +++ weakness, slurred speech, loss of memory or consciousness   SUBJECTIVE:  As above  VITAL SIGNS: BP 100/71 (BP Location: Left Arm)   Pulse (!) 55   Temp 97.7 F (36.5 C) (Oral)   Resp 18   Ht 6' 1.2" (1.859 m)   Wt 83.5 kg (184 lb)   SpO2 100%   BMI 24.14 kg/m   HEMODYNAMICS:    VENTILATOR SETTINGS:    INTAKE / OUTPUT: I/O last 3 completed shifts: In: 471.7 [P.O.:360; I.V.:111.7] Out: 1700 [Urine:1000; Chest Tube:700]  PHYSICAL EXAMINATION:  General:  Chronically ill appearing and weak, resting comfortably in bed HENT: NCAT OP clear PULM: CTA B, normal effort CV: RRR, no mgr GI: BS+, soft, nontender MSK: normal bulk and tone Neuro: awake, alert, no distress, weak   LABS:  BMET Recent Labs  Lab 02/21/17 0734  NA 138  K 3.5  CL 101  CO2 28  BUN 35*  CREATININE 1.14   GLUCOSE 94    Electrolytes Recent Labs  Lab 02/21/17 0734  CALCIUM 8.3*  MG 2.1    CBC Recent Labs  Lab 02/21/17 0734  WBC 5.1  HGB 10.4*  HCT 32.4*  PLT 253    Coag's No results for input(s): APTT, INR in the last 168 hours.  Sepsis Markers No results for input(s): LATICACIDVEN, PROCALCITON, O2SATVEN in the last 168 hours.  ABG No results for input(s): PHART, PCO2ART, PO2ART in the last 168 hours.  Liver Enzymes Recent Labs  Lab 02/21/17 0734  AST 21  ALT 19  ALKPHOS 110  BILITOT 0.8  ALBUMIN 2.5*    Cardiac Enzymes No results for input(s): TROPONINI, PROBNP in the last 168 hours.  Glucose No results for input(s): GLUCAP in the last 168 hours.  Imaging Dg Chest Port 1v Same Day  Result Date: 02/20/2017 CLINICAL DATA:  Chest tube on Left became disconnecter, concerning for L pneumothorax EXAM: PORTABLE CHEST 1 VIEW COMPARISON:  02/20/2017 at 4:39 a.m. FINDINGS: No pneumothorax.  Left chest tube is stable in position. Cardiac silhouette is mildly enlarged. Mild hazy opacity noted in the lung bases likely combination of small effusions and atelectasis. No evidence of pulmonary edema. Right internal jugular central venous line is stable as is the left anterior chest wall pacemaker. IMPRESSION: 1. No change from  the earlier study. 2. No pneumothorax.  Stable position of the left chest tube. Electronically Signed   By: Lajean Manes M.D.   On: 02/20/2017 14:36     LINES/TUBES: R IJ Power PICC date? R pleural drainage catheter date? L Pleural drainage catheter date?   DISCUSSION: 71 y/o male with multiple medical problems admitted for management of decline in functional status, failure to thrive after a prolonged hospitalization after a bowel perforation back in 11/2016.  He has bilateral chest drains.  The R chest tube isn't draining anything so it should be removed.  The left chest tube was unfortunately clamped overnight.  I have unclamped it and it is now  draining pleural fluid.  If drainage is minimal overnight we can remove it 11/5.  ASSESSMENT / PLAN:  PULMONARY A: Bilateral chest tube P:   Remove right chest tube Unclamp left chest tube If drainage of left chest tube < 100cc by 11/5 can remove  Roselie Awkward, MD [place] [facility] Pager: [phone] Cell: [phone] After 3pm or if no response, call [phone]  02/21/2017, 10:26 AM

## 2017-02-21 NOTE — Evaluation (Signed)
Physical Therapy Evaluation Patient Details Name: Jacob Pittman MRN: [id number] DOB: [birth year] Today's Date: 02/21/2017   History of Present Illness  71 y/o male had a prolonged hospitalization in 11/2016 through 12/2016 at [facility] from a perforated gastric ulcer. He had many compilations including respiratory failure, MRSA bacteremia, delirium, and heart failure.  He was eventually discharged to an LTAC for further management.  Dr. Phillips Odor had been involved in his care and set a plan in place for palliation if his condition worsened.  PMH: ICM, DM, HTN, PAD, OSA, CHF, EF 15%, OA, ICD, PAF  Clinical Impression  Pt admitted with above diagnosis. Pt currently with functional limitations due to the deficits listed below (see PT Problem List).  Pt will benefit from skilled PT to increase their independence and safety with mobility to allow discharge to the venue listed below.  Pt may be going to hospice, but pt would like to be as strong as possible.  He was able to sit EOB today, but limited by leakage from chest tube removal site on the R.  WIll follow acutely.     Follow Up Recommendations Other (comment)(palliative/hospice?)    Equipment Recommendations  Other (comment)(to be determined)    Recommendations for Other Services       Precautions / Restrictions Precautions Precautions: Fall Precaution Comments: L chest tube at time of PT eval      Mobility  Bed Mobility Overal bed mobility: Needs Assistance Bed Mobility: Rolling;Sidelying to Sit;Sit to Supine Rolling: Mod assist Sidelying to sit: +2 for physical assistance;Min assist   Sit to supine: Mod assist   General bed mobility comments: Pt came to EOB with A of 2 with pt giving forth good effort.  Pt's R bandage where chest tube had been noted to be leaking while in sitting. RN called to room.  pt returned supine and rolling performed for bandage and pad, and gown change.  HR elevated into 120's  Transfers                 General transfer comment: deferred due to leaking at chest tube removal site.  Ambulation/Gait                Stairs            Wheelchair Mobility    Modified Rankin (Stroke Patients Only)       Balance Overall balance assessment: Needs assistance   Sitting balance-Leahy Scale: Poor Sitting balance - Comments: pt required B UE support with close min/guard                                     Pertinent Vitals/Pain Pain Assessment: Faces Faces Pain Scale: Hurts even more Pain Location: buttocks Pain Descriptors / Indicators: Grimacing Pain Intervention(s): Limited activity within patient's tolerance;Repositioned;Monitored during session    Home Living Family/patient expects to be discharged to:: Private residence Living Arrangements: Children Available Help at Discharge: Family;Available 24 hours/day Type of Home: House Home Access: Level entry     Home Layout: One level        Prior Function Level of Independence: Needs assistance         Comments: Pt reports he has been in LTAC last month.  Has not been ambulating recently.     Hand Dominance        Extremity/Trunk Assessment   Upper Extremity Assessment Upper Extremity Assessment: Generalized weakness  Lower Extremity Assessment Lower Extremity Assessment: Generalized weakness       Communication      Cognition Arousal/Alertness: Awake/alert Behavior During Therapy: WFL for tasks assessed/performed Overall Cognitive Status: Within Functional Limits for tasks assessed                                 General Comments: Pt very quiet      General Comments General comments (skin integrity, edema, etc.): Pt with sacral wound and on air mattress    Exercises     Assessment/Plan    PT Assessment Patient needs continued PT services  PT Problem List Decreased strength;Decreased activity tolerance;Decreased balance;Decreased mobility;Cardiopulmonary  status limiting activity       PT Treatment Interventions DME instruction;Gait training;Functional mobility training;Balance training;Therapeutic exercise;Therapeutic activities;Patient/family education    PT Goals (Current goals can be found in the Care Plan section)  Acute Rehab PT Goals Patient Stated Goal: to walk again PT Goal Formulation: With patient Time For Goal Achievement: 03/07/17 Potential to Achieve Goals: Fair    Frequency Min 2X/week   Barriers to discharge        Co-evaluation               AM-PAC PT "6 Clicks" Daily Activity  Outcome Measure Difficulty turning over in bed (including adjusting bedclothes, sheets and blankets)?: Unable Difficulty moving from lying on back to sitting on the side of the bed? : Unable Difficulty sitting down on and standing up from a chair with arms (e.g., wheelchair, bedside commode, etc,.)?: Unable Help needed moving to and from a bed to chair (including a wheelchair)?: Total Help needed walking in hospital room?: Total Help needed climbing 3-5 steps with a railing? : Total 6 Click Score: 6    End of Session Equipment Utilized During Treatment: Oxygen Activity Tolerance: Treatment limited secondary to medical complications (Comment) Patient left: in bed Nurse Communication: Mobility status PT Visit Diagnosis: Muscle weakness (generalized) (M62.81);Other abnormalities of gait and mobility (R26.89)    Time: 1355-1425 PT Time Calculation (min) (ACUTE ONLY): 30 min   Charges:   PT Evaluation $PT Eval Moderate Complexity: 1 Mod PT Treatments $Therapeutic Activity: 8-22 mins   PT G Codes:        Jelicia Nantz L. Katrinka BlazingSmith, [place]PT Pager [phone] 02/21/2017   Enzo MontgomeryKaren L Shaliah Wann 02/21/2017, 3:42 PM

## 2017-02-21 NOTE — Progress Notes (Addendum)
Palliative Care Progress Note  Jacob Pittman is more reserved today.  "A lot on his mind". SOB improving. Pain with moving- in general does not complain.  Vitals:   02/20/17 2120 02/21/17 0547  BP: 106/89 100/71  Pulse: 79 (!) 55  Resp: 16 18  Temp: 97.8 F (36.6 C) 97.7 F (36.5 C)  SpO2: 97% 100%   Awake, Alert, Pale Bilateral chest tubes, multiple other drains IJ central line Feet are warm, no edema, no contractures Decreased breath sounds, +crackles Wounds with clean dry dressings  Present on Admission: . Complicated wound infection . Bladder neck obstruction . Acute on chronic combined systolic and diastolic CHF (congestive heart failure) (HCC) . Chronic combined systolic and diastolic heart failure, NYHA class 3 (HCC) . ICD (implantable cardioverter-defibrillator), single, in situ . Peripheral arterial disease (HCC) . Dyspnea . Perforated duodenal bulb ulcer (HCC) . Paroxysmal A-fib (HCC) . Pressure sore on heel, left, unstageable (HCC) . Poor venous access . Critical illness polyneuropathy (HCC) . Protein-energy malnutrition (HCC) . Decubitus ulcer of sacral region, stage 4 (HCC) . Complication of chest tube   Assessment/Plan:  1. 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   Awaiting WOC recommendations  Premedicate with PRN pain Medications prior to drg change  11/2  Requested WOC consult- would appreciate palliative wound care recommendations and also a comment on wound trajectory and any potential or non-potential for healing. Ex. Would this require surgery- he would never survive surgery now or in the future.My goal is to give an honest assessment of his poor prognosis in terms of wound healing and plan for a better QOL.  Oral Doxycycline for MRSA suppression and chronic wound infection.  Mattress overlay ordered  2. Respiratory Failure, Combined Acute systolic and diastolic heart  failure. Prior EF 25% He has known A-Fib and is on Eloquis. He was diagnosed with large left chest pleural effusion. Chest tube was placed at [facility]- high output but appearance improved onimaging from [facility].   Transition Lasix to PO today, diuresed last PM  BILATERAL CHEST TUBE MANAGEMENT  Replaced drainage systems  Checked XR  Hope to be able to convert to pleurx which can be managed by hospice  11/2  Will diurese with Lasix for his comfort  Keep Chest tube to gravity/WS on Pleur-evac  Will repeat a CXR in AM  Will consult Pulmonary to advise on chest tube and remove if possible- if not will have to see if hospice would take a Pleur-evac  I reviewed Chest CT from [facility]- concerning process in left lower lung base.  Will treat dyspnea with PRN opioids for comfort  Nebs as needed  3. AICD/Docycycline indefinitely/unclear status patient has DNR order  Will discuss with patient and family and if still on will have AICD part turned off   4. Anticogulation/ VTE in his right arm  Continue Eloquis- VTE and A Fib  5. Protein Calorie Malnutrition and Weight Loss  Minimal PO intake not sustaining his nutrition  No artificial feeding  Increased Mirtazapine to 15 qhs  KNown dysphagia -requires full assistance with meals  6. Symptoms: Pain, Dyspnea and Generalized misery, existential suffering with "dying"   PRN hydromorphone for dyspnea and pain  Chaplain consult, provided supportive conversation at bedside.  7. Bowel regimen and palliative prophylaxis ordered.  8. DNR on chart  9. He wants PT to help assess his ability to walk.   Anderson Malta, DO Palliative Medicine

## 2017-02-22 ENCOUNTER — Other Ambulatory Visit: Payer: Self-pay

## 2017-02-22 ENCOUNTER — Inpatient Hospital Stay (HOSPITAL_COMMUNITY): Payer: Medicare PPO

## 2017-02-22 DIAGNOSIS — T859XXD Unspecified complication of internal prosthetic device, implant and graft, subsequent encounter: Secondary | ICD-10-CM

## 2017-02-22 DIAGNOSIS — Z9689 Presence of other specified functional implants: Secondary | ICD-10-CM

## 2017-02-22 DIAGNOSIS — I5042 Chronic combined systolic (congestive) and diastolic (congestive) heart failure: Secondary | ICD-10-CM

## 2017-02-22 DIAGNOSIS — T859XXS Unspecified complication of internal prosthetic device, implant and graft, sequela: Secondary | ICD-10-CM

## 2017-02-22 NOTE — Progress Notes (Signed)
Notified dr Phillips Odor that patients bp has been steadily dropping. bp running in 80's over 60's

## 2017-02-22 NOTE — Progress Notes (Signed)
PULMONARY / CRITICAL CARE MEDICINE   Name: Jacob Pittman MRN: [id number] DOB: 07-23-1945    ADMISSION DATE:  02/19/2017 CONSULTATION DATE:  02/21/2017  REFERRING MD:  Phillips Odor  CHIEF COMPLAINT:  Chest tube management  HISTORY OF PRESENT ILLNESS:   71 y/o male had a prolonged hospitalization in 11/2016 through 12/2016 at [facility] from a perforated gastric ulcer. He had many compilations including respiratory failure, MRSA bacteremia, delirium, and heart failure.  He was eventually discharged to an LTAC for further management.  Dr. Phillips Odor had been involved in his care and set a plan in place for palliation if his condition worsened.  At the [facility] he became weak, and had multiple tubes and lines placed including bilateral pleural drainage catheters.  PCCM consulted for evaluation of the same.   SUBJECTIVE:  Pt denies acute complaints.  Minimal drainage in chest tube chamber.    VITAL SIGNS: BP (!) 86/66 (BP Location: Left Arm)   Pulse (!) 105   Temp (!) 97.5 F (36.4 C) (Oral)   Resp 16   Ht 6' 1.2" (1.859 m)   Wt 184 lb (83.5 kg)   SpO2 91%   BMI 24.14 kg/m   HEMODYNAMICS:    VENTILATOR SETTINGS:    INTAKE / OUTPUT: I/O last 3 completed shifts: In: 360 [P.O.:360] Out: 400 [Urine:400]  PHYSICAL EXAMINATION: General: chronically ill appearing in NAD HEENT: MM pink/moist, no jvd Neuro: awake/alert, MAE CV: s1s2 rrr, no m/r/g PULM: even/non-labored, lungs bilaterally clear, L chest tube with <30 ml in chamber total, none in tubing.   HT:XHFS, non-tender, bsx4 active  Extremities: warm/dry, no edema  Skin: no rashes or lesions    LABS:  BMET Recent Labs  Lab 02/21/17 0734  NA 138  K 3.5  CL 101  CO2 28  BUN 35*  CREATININE 1.14  GLUCOSE 94    Electrolytes Recent Labs  Lab 02/21/17 0734  CALCIUM 8.3*  MG 2.1    CBC Recent Labs  Lab 02/21/17 0734  WBC 5.1  HGB 10.4*  HCT 32.4*  PLT 253   Liver Enzymes Recent Labs  Lab 02/21/17 0734  AST 21  ALT  19  ALKPHOS 110  BILITOT 0.8  ALBUMIN 2.5*   Imaging No results found.   LINES/TUBES: R IJ Power PICC date? R pleural drainage catheter date? L Pleural drainage catheter date?   DISCUSSION: 71 y/o male with multiple medical problems admitted for management of decline in functional status, failure to thrive after a prolonged hospitalization after a bowel perforation back in 11/2016.  He has bilateral chest drains.  The R chest tube isn't draining and was removed 11/4.  The left chest tube was unfortunately clamped overnight. This was unclamped and drained <50 ml.    ASSESSMENT / PLAN:  PULMONARY A: Bilateral chest tube P:   Discontinue left chest tube  Cover site with occlusive dressing / Vaseline gauze Monitor respiratory status  Follow up CXR in 6 hours  Canary Brim, NP-C [place] [facility] Pgr: [phone] or if no answer [phone] 02/22/2017, 1:22 PM

## 2017-02-22 NOTE — Progress Notes (Signed)
Addendum to prior note.  Will hold follow up CXR post removal.  Monitor for new or worsening symptoms.   Canary Brim, NP-C [place]  [facility] Pgr: [phone] or if no answer [phone] 02/22/2017, 3:26 PM

## 2017-02-22 NOTE — Progress Notes (Signed)
Palliative Care Team at [place] Note   SUBJECTIVE: Right Chest tube has been removed. He remains very weak, poor appetite. PT sat him up on the side of the bed. Pain with positioning and movement- reports that his legs buckle- he tells me he just wants to be able to use a bedside commode. Dyspnea has improved.  Interval Events: Admitted from [place] 11/2 for definitive management of bilateral chest tubes, complex and complicated wounds and planned transition to hospice care.  Met with patient and daughter on 11/2 discussed goals of care.  OBJECTIVE: Vital Signs: BP 98/67 (BP Location: Left Arm)   Pulse 67   Temp 98.4 F (36.9 C) (Oral)   Resp 16   Ht 6' 1.2" (1.859 m)   Wt 83.5 kg (184 lb)   SpO2 99%   BMI 24.14 kg/m    Intake and Output: 11/04 0701 - 11/05 0700 In: 360 [P.O.:360] Out: 400 [Urine:400]  Physical Exam: General: Vital signs reviewed and noted. Well-developed, well-nourished, in no acute distress; alert, appropriate and cooperative throughout examination.  Head: Normocephalic, atraumatic.  Lungs:  Normal respiratory effort. Clear to auscultation BL without crackles or wheezes.  Heart: RRR. S1 and S2 normal without gallop,  or rubs. (+) murmur  Abdomen:  BS normoactive. Soft, Nondistended, non-tender.  No masses or organomegaly.  Extremities: No pretibial edema.    No Known Allergies  Medications: Scheduled Meds:  . acetaminophen  1,000 mg Oral TID  . apixaban  5 mg Oral Q12H  . collagenase   Topical Daily  . doxycycline  100 mg Oral Q12H  . furosemide  20 mg Oral Daily  . mirtazapine  15 mg Oral QHS  . senna-docusate  1 tablet Oral QHS  . sodium chloride flush  10-40 mL Intracatheter Q12H    Continuous Infusions: . sodium chloride 20 mL/hr at 02/21/17 2248    PRN Meds: antiseptic oral rinse, HYDROmorphone (DILAUDID) injection, ipratropium-albuterol, LORazepam, ondansetron **OR** ondansetron (ZOFRAN) IV, sodium chloride  flush  Stool Softner: YES  Palliative Performance Scale: 30 %   Pain Present?: Patient complains minimally, but shows non-verbal signs of pain received PRN last PM. +Dyspnea  Labs: CBC    Component Value Date/Time   WBC 5.1 02/21/2017 0734   RBC 3.12 (L) 02/21/2017 0734   HGB 10.4 (L) 02/21/2017 0734   HCT 32.4 (L) 02/21/2017 0734   PLT 253 02/21/2017 0734   MCV 103.8 (H) 02/21/2017 0734   MCH 33.3 02/21/2017 0734   MCHC 32.1 02/21/2017 0734   RDW 16.9 (H) 02/21/2017 0734   LYMPHSABS 1.2 01/02/2017 0358   MONOABS 0.4 01/02/2017 0358   EOSABS 0.0 01/02/2017 0358   BASOSABS 0.0 01/02/2017 0358    CMET     Component Value Date/Time   NA 138 02/21/2017 0734   K 3.5 02/21/2017 0734   CL 101 02/21/2017 0734   CO2 28 02/21/2017 0734   GLUCOSE 94 02/21/2017 0734   BUN 35 (H) 02/21/2017 0734   CREATININE 1.14 02/21/2017 0734   CREATININE 0.91 03/04/2015 1618   CALCIUM 8.3 (L) 02/21/2017 0734   PROT 5.2 (L) 02/21/2017 0734   ALBUMIN 2.5 (L) 02/21/2017 0734   AST 21 02/21/2017 0734   ALT 19 02/21/2017 0734   ALKPHOS 110 02/21/2017 0734   BILITOT 0.8 02/21/2017 0734   GFRNONAA >60 02/21/2017 0734   GFRAA >60 02/21/2017 0734     Imaging: Chest Xray Reviewed/ Impressions: Right Effusion resolved. Left moderate effusion, bilateral atelectasis  ASSESSMENT/ PLAN: Mr. Michelle has responded remarkably well to de-escalation of his aggressive medical interventions. He continues to be severely malnourished and deconditioned and his prognosis remains fragile and guarded. He has a deep desire to "get better" this for him means being able to do at least some self care like get to a [facility] with assistance and to be free of pain and shortness of breath. His PT session today was limited by his chest tube leaking.  His main issues currently are as follows:  1. Protein Calorie Malnutrition: Poor appetite, on mirtazapine, multifactorial, may in fact just be his body shutting down and the  totality of his critical, now chronic illness state.Albumin is 2.5. Additionally his primary event was a perforated gastric pylorus from an ulcer, H. 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  2. 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   3. Bilateral Chest Tubes, placed at [facility] for bilateral pleural effusions- probably related to to malnutrition and heart failure. Right Tube was removed today. Left continues to have output and show minimal change on CXR, will reassess this chest tube tomorrow- appreciate pulmonary assistance and ICU nurses help in managing these tubes. Goal is to remove the tubes completely- if left is an issue we can consider a pleurx which can be managed outpatient if effusion is symptomatic and persists or reoccurs. His dyspnea is being managed with low dose opioids, nebs and O2.  4. Congestive Heart Failure EF 25%- Volume status has required attention- gave him Lasix 40 X two with good UOP, he has pre-existing bladder outlet obstruction and is very prone to urinary retension which has caused him delirium and infection in the past- at this time leaving a foley in place will be best plan unless it becomes painful for him or a source of infection.  5. Hx MRSA Bacteremia with implanted ICD- he is on daily suppression with oral Doxycycline indefinitely- with his current nutritional state and high risk for procedures he is not a candidate for removal and replacement.   6. 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  Prognosis: He remains very fragile, high risk for sepsis or decompensated heart  failure. He has a combination of terminal problems - he could have sudden death at anytime  Greater than 50%  of this time was spent counseling and coordinating care related to the above assessment and plan.   Acquanetta Chain, DO  02/22/2017, 11:48 PM  Please contact Palliative Medicine Team phone at [phone] for questions and concerns.

## 2017-02-22 NOTE — Consult Note (Addendum)
WOC Nurse wound consult note Reason for Consult: Consult requested for sacrum, left heel, and abd. Pt is familiar to [facility] team from previous recent admission; refer to consult note on 9/24.  Wound type: Abd full thickness wound with previously noted fistula.  8X1.2X.3cm, beefy red, small amt tan drainage, appears to be decreased amt of drainage from fistula site at the lower wound area at this time. Left heel with previous deep tissue injury has evolved intro 50% stage 2 pink and dry , 50% deep tissue injury; darker colored skin.  .3X.3X.1cm. Sacrum with unstageable pressure injury; 8X6X.8cm with .8 cm undermining to wound edge.  70% red, 30% yellow slough, mod amt tan drainage, no odor. Pressure Injury POA: Yes Dressing procedure/placement/frequency: Requested to assess for possible Vac therapy.  [employer] literature states wounds should have less than 20% nonviable tissue for a Vac to be appropriate; this wound remains with too much slough at this time.  Continue present plan of care with Santyl for enzymatic debridement of nonviable tissue to sacrum. It will be difficult to promote healing to the location related to many systemic factors which can impair healing.  Pt will need a large amt protein intake. Float heels to reduce pressure. Air mattress in place to reduce pressure. Foam dressing to protect and promote healing to left heel.  Gauze dressing to abd wound to absorb drainage. Topical treatment orders are in alignment with comfort care goals, if this is the desired plan of care. No family members present to discuss plan of care. Please re-consult if further assistance is needed.  Thank-you,  Cammie Mcgee MSN, RN, CWOCN, [place], CNS [phone]

## 2017-02-23 LAB — MRSA PCR SCREENING: MRSA by PCR: NEGATIVE

## 2017-02-23 MED ORDER — CHLORHEXIDINE GLUCONATE 0.12 % MT SOLN
15.0000 mL | Freq: Four times a day (QID) | OROMUCOSAL | Status: DC
  Administered 2017-02-23 – 2017-02-26 (×7): 15 mL via OROMUCOSAL
  Filled 2017-02-23 (×7): qty 15

## 2017-02-23 MED ORDER — FLUCONAZOLE 100MG IVPB
100.0000 mg | INTRAVENOUS | Status: DC
Start: 2017-02-23 — End: 2017-02-26
  Administered 2017-02-23 – 2017-02-25 (×3): 100 mg via INTRAVENOUS
  Filled 2017-02-23 (×4): qty 50

## 2017-02-23 MED ORDER — PRO-STAT SUGAR FREE PO LIQD
30.0000 mL | Freq: Two times a day (BID) | ORAL | Status: DC
  Administered 2017-02-23 – 2017-02-26 (×7): 30 mL via ORAL
  Filled 2017-02-23 (×7): qty 30

## 2017-02-23 MED ORDER — FAMOTIDINE 20 MG PO TABS
40.0000 mg | ORAL_TABLET | Freq: Every day | ORAL | Status: DC
  Administered 2017-02-23 – 2017-02-26 (×4): 40 mg via ORAL
  Filled 2017-02-23 (×4): qty 2

## 2017-02-23 NOTE — Progress Notes (Signed)
Patient transferred to a regular bed, per patient request. Patient told his MD that he was not comfortable and wanted a regular mattress. MD agreed and put in an order that he is not tolerating air mattress or over lay. Fall mats requested for extra safety measure.

## 2017-02-23 NOTE — Progress Notes (Signed)
Palliative Care Team at [place] Note   SUBJECTIVE: Jacob Pittman remains very weak, deconditioned, and with poor appetite. Jacob Pittman tells me Jacob Pittman thinks we are making him better. Jacob Pittman is grateful to have the chest tubes removed- Jacob Pittman reports feeling hopeful today. Jacob Pittman wants a goal to be getting up to the [facility].    Interval Events:  Admitted from [place] 11/2 for definitive management of bilateral chest tubes, complex and complicated wounds and planned transition to hospice care.  Met with patient and daughter on 11/2 discussed goals of care.  Right Chest tube removed 11/4.  [place] Consult 11/5  Left Chest Tube to be removed (today) 11/5.  OBJECTIVE: Vital Signs: BP 98/67 (BP Location: Left Arm)   Pulse 67   Temp 98.4 F (36.9 C) (Oral)   Resp 16   Ht 6' 1.2" (1.859 m)   Wt 83.5 kg (184 lb)   SpO2 99%   BMI 24.14 kg/m    Intake and Output: 11/05 0701 - 11/06 0700 In: 55 [P.O.:720] Out: 400 [Urine:400]  Physical Exam: General: Vital signs reviewed and noted. Frail, chronically ill appearing in no acute distress; alert, appropriate and cooperative throughout examination. Urine appears very dark in Foley bag.  Head: Normocephalic, atraumatic.  Lungs:  O2 Cannula not in his nose but doing fine without it for an unknown period of time. Right chest tube site covered. Left tube with minimal drainage.  Heart: Irregular rate normal (+) murmur  Abdomen:  Midline wound and ostomy covered  Extremities: No LE edema, +presacral edema. Pulses palpable in DP, cool feet but brisk cap refill    No Known Allergies  Medications: Scheduled Meds:  . acetaminophen  1,000 mg Oral TID  . apixaban  5 mg Oral Q12H  . collagenase   Topical Daily  . doxycycline  100 mg Oral Q12H  . mirtazapine  15 mg Oral QHS  . senna-docusate  1 tablet Oral QHS  . sodium chloride flush  10-40 mL Intracatheter Q12H    Continuous Infusions: . sodium chloride 20 mL/hr at 02/21/17 2248    PRN  Meds: antiseptic oral rinse, HYDROmorphone (DILAUDID) injection, ipratropium-albuterol, LORazepam, ondansetron **OR** ondansetron (ZOFRAN) IV, sodium chloride flush  Stool Softner: YES  Palliative Performance Scale: 30 %   Pain Present?: Patient complains minimally, but shows non-verbal signs of pain received PRN last PM. +Dyspnea  Labs: CBC    Component Value Date/Time   WBC 5.1 02/21/2017 0734   RBC 3.12 (L) 02/21/2017 0734   HGB 10.4 (L) 02/21/2017 0734   HCT 32.4 (L) 02/21/2017 0734   PLT 253 02/21/2017 0734   MCV 103.8 (H) 02/21/2017 0734   MCH 33.3 02/21/2017 0734   MCHC 32.1 02/21/2017 0734   RDW 16.9 (H) 02/21/2017 0734   LYMPHSABS 1.2 01/02/2017 0358   MONOABS 0.4 01/02/2017 0358   EOSABS 0.0 01/02/2017 0358   BASOSABS 0.0 01/02/2017 0358    CMET     Component Value Date/Time   NA 138 02/21/2017 0734   K 3.5 02/21/2017 0734   CL 101 02/21/2017 0734   CO2 28 02/21/2017 0734   GLUCOSE 94 02/21/2017 0734   BUN 35 (H) 02/21/2017 0734   CREATININE 1.14 02/21/2017 0734   CREATININE 0.91 03/04/2015 1618   CALCIUM 8.3 (L) 02/21/2017 0734   PROT 5.2 (L) 02/21/2017 0734   ALBUMIN 2.5 (L) 02/21/2017 0734   AST 21 02/21/2017 0734   ALT 19 02/21/2017 0734   ALKPHOS 110 02/21/2017 0734  BILITOT 0.8 02/21/2017 0734   GFRNONAA >60 02/21/2017 0734   GFRAA >60 02/21/2017 0734     Imaging: Chest Xray Reviewed/ Impressions: Right Effusion resolved. Left moderate effusion, bilateral atelectasis  ASSESSMENT/ PLAN: Jacob Pittman has responded remarkably well to de-escalation of his aggressive medical interventions tolerated right chest tube removal yesterday and plan is to remove the left tube today. His wounds are serious but require basic wound care and pressure reduction - treating with Santyl and gauze drg. Less output from the fistula/ostomy siight near the abdominal wound. Jacob Pittman has been running more hypotensive- I stopped his midodrine on admission but Jacob Pittman doesn't need  peripheral vasoconstriction at this point and Im not convinced it was helping him or making a significant difference- His hypotension may be his baseline and his renal function is not impaired and Jacob Pittman is not symptomatic- Jacob Pittman has a very dilated enlarged heart on his last Echo.  Jacob Pittman continues to be severely malnourished and deconditioned and his prognosis remains fragile and guarded. Jacob Pittman has a deep desire to "get better" this for him means being able to do at least some self care like get to a [facility] with assistance and to be free of pain and shortness of breath. Our goals now are to remove the tubes and drains and physically restrictive and uncomfortable interventions. Ideally would like to get him to a point where Jacob Pittman could be managed by hospice either at home or in a SNF- if continues to not take in adequate nutrition then Jacob Pittman may be a candidate for a hospice facility.   Given his serious condition Jacob Pittman remains mentally alert, oriented, pleasant and cooperative.  His main issues currently are as follows:  1. Protein Calorie Malnutrition: Poor appetite, on mirtazapine, multifactorial, may in fact just be his body shutting down and the totality of his critical, now chronic illness state.Albumin is 2.5. Additionally his primary event was a perforated gastric pylorus from an ulcer, H. 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  Will start him on a H2 Blocker and a possibly add a PPI- I question if perhaps Jacob Pittman was symptomatic with his ulcer months before the perforation and has anticipatory avoidance behaviors with food intake.  2. Complex and complicated wounds: 3 significant wounds.  WOC Note:  Abd full thickness wound with previously noted fistula.  8X1.2X.3cm, beefy red, small amt tan drainage, appears to be decreased amt of drainage from fistula site at the lower wound area at this time.  Left heel with previous  deep tissue injury has evolved intro 50% stage 2 pink and dry , 50% deep tissue injury; darker colored skin.  .3X.3X.1cm.  Sacrum with unstageable pressure injury; 8X6X.8cm with .8 cm undermining to wound edge.  70% red, 30% yellow slough, mod amt tan drainage, no odor.  Recs from Wound Care: Santyl for enzymatic debridement of nonviable tissue to sacrum. It will be difficult to promote healing to the location related to many systemic factors which can impair healing.  Pt will need a large amt protein intake. Float heels to reduce pressure. Air mattress in place to reduce pressure. Foam dressing to protect and promote healing to left heel.  Gauze dressing to abd wound to absorb drainage. Topical treatment orders are in alignment with comfort care goals, if this is the desired plan of care.   3. Bilateral Chest Tubes, placed at [facility] for bilateral pleural effusions- probably related to to malnutrition and  heart failure. Left tube to be removed today- appreciate pulmonary assistance and ICU nurses help in managing these tubes. Goal is to remove the tubes completely- if left is an issue we can consider placement of a pleurx which can be managed outpatient if effusion is symptomatic and persists or reoccurs. Check CXR in AM. His dyspnea is being managed with low dose opioids, nebs and O2. Can turn O2 down to 2 L currently on 4L.  4. Congestive Heart Failure EF 25%- Volume status has required attention- gave him Lasix 40 X two with good UOP, Jacob Pittman has pre-existing bladder outlet obstruction and is very prone to urinary retension which has caused him delirium and infection in the past- at this time leaving a foley in place will be best plan unless it becomes painful for him or a source of infection. Urine is dark and BUN rising- will change lasix to QOD and hold for now. Encourage PO intake and hydration. Will request ICD be assessed- I cannot find documentation that it was turned off and family does not know. The  ICD was placed in 2014 for primary prevention.  5. Hx MRSA Bacteremia with implanted ICD- Jacob Pittman is on daily suppression with oral Doxycycline indefinitely- with his current nutritional state and high risk for procedures Jacob Pittman is not a candidate for removal and replacement.   6. 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  Prognosis: Jacob Pittman remains very fragile, high risk for sepsis or decompensated heart failure. Jacob Pittman has a combination of terminal problems - Jacob Pittman could have sudden death at anytime or Jacob Pittman could continue to make very slow improvement or even make an attempt at rehab and recovery with success until an event occurs- very difficult to predict his trajectory- factors I am looking at prognostically mostly have to do with his PO intake and meeting his basic nutritional requirements and if or how fast his pleural effusion reoccur. His wounds need daily care and attention and Jacob Pittman needs to be OOB if any healing is going to happen.  Disposition: I discussed with Unit CSW, plan to meet with patient and family tomorrow and make a plan for next steps.   Possibilities for Discharge: 1. Hospice Facility, Jacob Pittman would be declining in need of frequent symptom management and have limited prognosis of 2-4 weeks. This may be true in the near future. Goals would need to be full comfort.  2. Home with Hospice, I need to explore his daughters commitments and his home situation-this would be difficult but not impossible with hospice approach- only concern is that Jacob Pittman has requested PT and it would be difficult to meet that need aggressively in home hospice care.  3. Inpatient Rehab- this may be a good option for him if Jacob Pittman continues to improve and demonstrates high motivation. PC can follow on CIR unit. Will consider a referral and evaluation.  4. SNF for Rehab- PC can see him, but Jacob Pittman may be limited to non-preferred facilities depending on  availability- would be reasonable to fax his information out.  5. SNF for LTC with Hospice- ?if Jacob Pittman is eligible for medicaid- would only work if Jacob Pittman is dually covered- limited personal financial resources.  Greater than 50%  of this time was spent counseling and coordinating care related to the above assessment and plan.   Acquanetta Chain, DO  02/23/2017, 12:43 AM  Please contact Palliative Medicine Team phone at [phone] for questions and  concerns.

## 2017-02-23 NOTE — Care Management Important Message (Signed)
Important Message  Patient Details  Name: Jamarrius Offield MRN: [id number] Date of Birth: Feb 14, 1946   Medicare Important Message Given:  Yes    Caren Macadam 02/23/2017, 11:37 AMImportant Message  Patient Details  Name: Ean Clemans MRN: [id number] Date of Birth: Jul 15, 1945   Medicare Important Message Given:  Yes    Caren Macadam 02/23/2017, 11:37 AM

## 2017-02-23 NOTE — Clinical Social Work Note (Signed)
Clinical Social Work Assessment  Patient Details  Name: Jacob Pittman MRN: [id number] Date of Birth: 04/26/1945  Date of referral:  02/23/17               Reason for consult:  Discharge Planning                Permission sought to share information with:  Family Supports Permission granted to share information::  Yes, Verbal Permission Granted  Name::     Daughter [name]  Agency::     Relationship::     Contact Information:     Housing/Transportation Living arrangements for the past 2 months:  Post-Acute Facility([facility]) Source of Information:  Medical Team, Adult Children Patient Interpreter Needed:  None Criminal Activity/Legal Involvement Pertinent to Current Situation/Hospitalization:  No - Comment as needed Significant Relationships:  Adult Children, Other Family Members Lives with:  Self Do you feel safe going back to the place where you live?  No Need for family participation in patient care:  Yes (Comment)(daughter primary decision maker)  Care giving concerns:  Pt admitted from [facility]. Was placed there from [facility] in September 2018 after prolonged hospitalization and per chart significant barriers to identifying appropriate DC plan at that time. Daughter reports "it went terribly." CSW inquired as to issues and daughter reported she had been under impression that palliative care was going to continue to be involved in community but is unclear about whether this transpired- pt had chest tube put in and daughter states she felt this did not align with their goals. Reports family did not know if pt would survive hospitalization at [facility] in August-September and are now wondering if hospice or other facility (?SNF) with hospice is appropriate for pt.    Social Worker assessment / plan:  CSW consulted to assist with disposition planning.  Met with pt at bedside- calm and pleasant to CSW but drowsy and advises his daughter is primary Media planner. CSW spoke with  daughter via phone. She reports family is interested in pursuing hospice but are unsure if he qualifies and what level of care he needs otherwise. Does mention SNF however no payor source as goal is not for rehab (has [facility] which requires preauthorization). Daughter states she was under impression that "[facility] may have applied for Medicaid for him" and is following up with facility and DSS re: status. CSW explained if pt has Medicaid pending, this may open up very limited SNF options for him IF SNF deemed appropriate level of care at DC. Daughter very undecided about next steps- feel support at home is limited but have not declined considering taking pt home with hospice either. Are open to residential hospice  Daughter states she has had discussions with palliative team re: pt's options and is awaiting further input re: pt's prognosis.   Plan: CSW will follow and assist with disposition planning once more specific needs known.   Employment status:  Retired Nurse, adult PT Recommendations:  (other- palliative care) Information / Referral to community resources:     Patient/Family's Response to care:  Family appreciative of hospital staff's care of pt. Pt states, "they move me too much"  Patient/Family's Understanding of and Emotional Response to Diagnosis, Current Treatment, and Prognosis:  Pt is drowsy and does not express understanding of current status, however he is oriented to person/place and enough to direct CSW to his daughter for discussions. Daughter demonstrates adequate understanding but admits she feels she does not know  enough about pt's medical prognosis or about his social support resources available at DC in order to move forward with planning. Does express understanding of potential barriers to plans. Is emotionally appropriate to situation- expressed thanks for staff's care of pt  Emotional Assessment Appearance:  Appears stated  age Attitude/Demeanor/Rapport:  (drowsy) Affect (typically observed):  Calm Orientation:  Oriented to Self, Oriented to Place Alcohol / Substance use:  Not Applicable Psych involvement (Current and /or in the community):  No (Comment)  Discharge Needs  Concerns to be addressed:  Discharge Planning Concerns Readmission within the last 30 days:  No Current discharge risk:  (still assessing) Barriers to Discharge:  Continued Medical Work up   [employer], LCSW 02/23/2017, 2:08 PM  [phone]

## 2017-02-24 DIAGNOSIS — Z95810 Presence of automatic (implantable) cardiac defibrillator: Secondary | ICD-10-CM

## 2017-02-24 NOTE — Progress Notes (Signed)
Pt's previous chest tube site draining large amount of serous fluid. Dressing changed and reinforced, Dr. Phillips Odor paged.

## 2017-02-24 NOTE — Progress Notes (Signed)
Physical Therapy Treatment Patient Details Name: Jacob Pittman MRN: [id number] DOB: [birth year] Today's Date: 02/24/2017    History of Present Illness 71 y/o male had a prolonged hospitalization in 11/2016 through 12/2016 at [facility] from a perforated gastric ulcer. He had many compilations including respiratory failure, MRSA bacteremia, delirium, and heart failure.  He was eventually discharged to an LTAC for further management.  Dr. Phillips Odor had been involved in his care and set a plan in place for palliation if his condition worsened.  PMH: ICM, DM, HTN, PAD, OSA, CHF, EF 15%, OA, ICD, PAF    PT Comments    Patient progressing to sit to stand this session, but still unable to get OOB due to significant serous drainage from R chest wound.  RN redressed prior to mobilization, then after standing draining through dressing onto floor.  Repositioned in R sidelying at end of session with linens changed, etc.  Feel will need SNF versus LTACH level of care at d/c.    Follow Up Recommendations  SNF;LTACH     Equipment Recommendations  None recommended by PT    Recommendations for Other Services       Precautions / Restrictions Precautions Precautions: Fall Precaution Comments: R chest wound drainage    Mobility  Bed Mobility Overal bed mobility: Needs Assistance Bed Mobility: Rolling;Sidelying to Sit;Sit to Supine Rolling: Mod assist Sidelying to sit: +2 for physical assistance;Mod assist   Sit to supine: Mod assist;+2 for physical assistance   General bed mobility comments: pt assist to flex knees and use of pad to help scoot and roll in the bed, able to come up to sit with assist for legs off bed and to lift trunk, to supine help for trunk and legs  Transfers Overall transfer level: Needs assistance Equipment used: Rolling walker (2 wheeled) Transfers: Sit to/from Stand Sit to Stand: Mod assist;+2 physical assistance         General transfer comment: able to stand with lifting  help and walker, knees remained flexed and chest tube site draining heavily serous fluid. so returned to supine and RN aware  Ambulation/Gait                 Stairs            Wheelchair Mobility    Modified Rankin (Stroke Patients Only)       Balance Overall balance assessment: Needs assistance   Sitting balance-Leahy Scale: Poor Sitting balance - Comments: pt required B UE support with close min/guard   Standing balance support: Bilateral upper extremity supported Standing balance-Leahy Scale: Poor Standing balance comment: mod A and walker for standing                            Cognition Arousal/Alertness: Awake/alert Behavior During Therapy: WFL for tasks assessed/performed Overall Cognitive Status: Within Functional Limits for tasks assessed                                        Exercises General Exercises - Lower Extremity Ankle Circles/Pumps: 5 reps;Both;Supine;AROM Short Arc Quad: AROM;Both;Supine;5 reps Heel Slides: 5 reps;AROM;Both;Supine    General Comments General comments (skin integrity, edema, etc.): asked RN for Prevlon boots, but noted pt requested back to regular bed due to did not prefer air mattress      Pertinent Vitals/Pain Pain Assessment: Faces Faces Pain Scale:  Hurts little more Pain Location: generalized Pain Descriptors / Indicators: Grimacing Pain Intervention(s): Monitored during session;Repositioned    Home Living                      Prior Function            PT Goals (current goals can now be found in the care plan section) Progress towards PT goals: Progressing toward goals    Frequency    Min 2X/week      PT Plan Discharge plan needs to be updated    Co-evaluation              AM-PAC PT "6 Clicks" Daily Activity  Outcome Measure  Difficulty turning over in bed (including adjusting bedclothes, sheets and blankets)?: Unable Difficulty moving from lying on  back to sitting on the side of the bed? : Unable Difficulty sitting down on and standing up from a chair with arms (e.g., wheelchair, bedside commode, etc,.)?: Unable Help needed moving to and from a bed to chair (including a wheelchair)?: Total Help needed walking in hospital room?: Total Help needed climbing 3-5 steps with a railing? : Total 6 Click Score: 6    End of Session Equipment Utilized During Treatment: Gait belt Activity Tolerance: Treatment limited secondary to medical complications (Comment) Patient left: in bed Nurse Communication: Mobility status;Other (comment)(and chest tube site draining) PT Visit Diagnosis: Muscle weakness (generalized) (M62.81);Other abnormalities of gait and mobility (R26.89)     Time: [id number] PT Time Calculation (min) (ACUTE ONLY): 47 min  Charges:  $Therapeutic Exercise: 8-22 mins $Therapeutic Activity: 23-37 mins                    G CodesSheran Pittman:       Jacob Pittman, [place]PT [phone] 02/24/2017    Jacob Pittman 02/24/2017, 12:16 PM

## 2017-02-25 ENCOUNTER — Encounter (HOSPITAL_COMMUNITY): Payer: Self-pay | Admitting: Internal Medicine

## 2017-02-25 MED ORDER — HYDROMORPHONE HCL 4 MG PO TABS
2.0000 mg | ORAL_TABLET | Freq: Every day | ORAL | Status: DC
  Administered 2017-02-25: 2 mg via ORAL
  Filled 2017-02-25: qty 1

## 2017-02-25 MED ORDER — FUROSEMIDE 20 MG PO TABS
20.0000 mg | ORAL_TABLET | Freq: Every day | ORAL | Status: DC
  Administered 2017-02-25 – 2017-02-26 (×2): 20 mg via ORAL
  Filled 2017-02-25 (×2): qty 1

## 2017-02-25 NOTE — Progress Notes (Signed)
Nurse has called [facility] , [facility], and [facility] per numbers provided on the physicians orders. There is no record on file for this patient at either of these facilities. MD to be notified

## 2017-02-25 NOTE — Progress Notes (Signed)
Palliative Care Team at [facility]   SUBJECTIVE: He remains very weak, deconditioned, and with poor appetite. His right chest tube site is draining significantly requiring frequent drg changes.  Interval Events:  Admitted from [place] 11/2 for definitive management of bilateral chest tubes, complex and complicated wounds and planned transition to hospice care.  Met with patient and daughter on 11/2 discussed goals of care.  Right Chest tube removed 11/4, Left chest tube removed 11/5  [place] 11/50- his abdominal wound is much improved,no Eakins pouch  Tx for thrush started 11/6   OBJECTIVE: Vital Signs: BP (!) 101/58 (BP Location: Left Arm)   Pulse 67   Temp 98 F (36.7 C) (Oral)   Resp 20   Ht 6' 1.2" (1.859 m)   Wt 83.5 kg (184 lb)   SpO2 98%   BMI 24.14 kg/m    Intake and Output: 11/07 0701 - 11/08 0700 In: 2216.7 [P.O.:120; I.V.:2096.7] Out: 425 [Urine:425]  Physical Exam: General: Vital signs reviewed and noted. Frail, chronically ill appearing in no acute distress; alert, appropriate and cooperative throughout examination. Urine appears very dark in Foley bag.  Head: Normocephalic, atraumatic.  Lungs:  O2 Cannula not in his nose but doing fine without it for an unknown period of time. Right chest tube site covered. Left tube with minimal drainage.  Heart: Irregular rate normal (+) murmur  Abdomen:  Midline wound and ostomy covered  Extremities: No LE edema, +presacral edema. Pulses palpable in DP, cool feet but brisk cap refill, Left arm is swollen, not red    No Known Allergies  Medications: Scheduled Meds:  . acetaminophen  1,000 mg Oral TID  . apixaban  5 mg Oral Q12H  . chlorhexidine  15 mL Mouth/Throat QID  . collagenase   Topical Daily  . doxycycline  100 mg Oral Q12H  . famotidine  40 mg Oral Daily  . feeding supplement (PRO-STAT SUGAR FREE 64)  30 mL Oral BID  . mirtazapine  15 mg Oral QHS  . senna-docusate  1 tablet  Oral QHS  . sodium chloride flush  10-40 mL Intracatheter Q12H    Continuous Infusions: . sodium chloride 20 mL/hr at 02/21/17 2248  . fluconazole (DIFLUCAN) IV Stopped (02/24/17 1745)    PRN Meds: antiseptic oral rinse, HYDROmorphone (DILAUDID) injection, ipratropium-albuterol, LORazepam, ondansetron **OR** ondansetron (ZOFRAN) IV, sodium chloride flush  Stool Softner: YES  Palliative Performance Scale: 30 %   Pain Present?: Patient complains minimally, but shows non-verbal signs of pain received PRN last PM. +Dyspnea  Labs: CBC    Component Value Date/Time   WBC 5.1 02/21/2017 0734   RBC 3.12 (L) 02/21/2017 0734   HGB 10.4 (L) 02/21/2017 0734   HCT 32.4 (L) 02/21/2017 0734   PLT 253 02/21/2017 0734   MCV 103.8 (H) 02/21/2017 0734   MCH 33.3 02/21/2017 0734   MCHC 32.1 02/21/2017 0734   RDW 16.9 (H) 02/21/2017 0734   LYMPHSABS 1.2 01/02/2017 0358   MONOABS 0.4 01/02/2017 0358   EOSABS 0.0 01/02/2017 0358   BASOSABS 0.0 01/02/2017 0358    CMET     Component Value Date/Time   NA 138 02/21/2017 0734   K 3.5 02/21/2017 0734   CL 101 02/21/2017 0734   CO2 28 02/21/2017 0734   GLUCOSE 94 02/21/2017 0734   BUN 35 (H) 02/21/2017 0734   CREATININE 1.14 02/21/2017 0734   CREATININE 0.91 03/04/2015 1618   CALCIUM 8.3 (L) 02/21/2017 0734   PROT  5.2 (L) 02/21/2017 0734   ALBUMIN 2.5 (L) 02/21/2017 0734   AST 21 02/21/2017 0734   ALT 19 02/21/2017 0734   ALKPHOS 110 02/21/2017 0734   BILITOT 0.8 02/21/2017 0734   GFRNONAA >60 02/21/2017 0734   GFRAA >60 02/21/2017 0734     Imaging: Chest Xray Reviewed/ Impressions: Right Effusion resolved. Left moderate effusion, bilateral atelectasis  ASSESSMENT/ PLAN: Mr. Sherk has responded remarkably well to de-escalation of his aggressive medical interventions - both chest tave been removed but given how long they had been in the removal sites continue to drain- I asked pulm to see today.  His wounds are serious but  require basic wound care and pressure reduction turning off loading- treating with Santyl and gauze drg. Less output from the fistula/ostomy siight near the abdominal wound- it appears the fistula has closed completely. He has been running more hypotensive- I stopped his midodrine on admission but he doesn't need peripheral vasoconstriction at this point and Im not convinced it was helping him or making a significant difference- . Our goals now are to remove the tubes and drains and physically restrictive and uncomfortable interventions. Ideally would like to get him to a point where he could be managed by hospice either at home or in a SNF- if continues to not take in adequate nutrition then he may be a candidate for a hospice facility.   Family Meeting today to discuss next steps in his care. Documentation separate note.  Given his serious condition he remains mentally alert, oriented, pleasant and cooperative.  His main issues currently are as follows:  1. Protein Calorie Malnutrition: Poor appetite, on mirtazapine, multifactorial, may in fact just be his body shutting down and the totality of his critical, now chronic illness state.Albumin is 2.5. Additionally his primary event was a perforated gastric pylorus from an ulcer, H. 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  Will start him on a H2 Blocker and a possibly add a PPI- I question if perhaps he was symptomatic with his ulcer months before the perforation and has anticipatory avoidance behaviors with food intake.  2. Complex and complicated wounds: 3 significant wounds.  WOC Note:  Abd full thickness wound with previously noted fistula.  8X1.2X.3cm, beefy red, small amt tan drainage, appears to be decreased amt of drainage from fistula site at the lower wound area at this time.  Left heel with previous deep tissue injury has evolved intro  50% stage 2 pink and dry , 50% deep tissue injury; darker colored skin.  .3X.3X.1cm.  Sacrum with unstageable pressure injury; 8X6X.8cm with .8 cm undermining to wound edge.  70% red, 30% yellow slough, mod amt tan drainage, no odor.  Recs from Wound Care: Santyl for enzymatic debridement of nonviable tissue to sacrum. It will be difficult to promote healing to the location related to many systemic factors which can impair healing.  Pt will need a large amt protein intake. Float heels to reduce pressure. Air mattress in place to reduce pressure. Foam dressing to protect and promote healing to left heel.  Gauze dressing to abd wound to absorb drainage. Topical treatment orders are in alignment with comfort care goals, which is desired plan of care.   3. Bilateral Chest Tubes, placed at [facility] for bilateral pleural effusions- probably related to to malnutrition and heart failure. Left tube to be removed today- appreciate pulmonary assistance and ICU nurses help in managing these  tubes. His dyspnea is being managed with low dose opioids, nebs and O2. Can turn O2 down to 2 L currently on 4L.  4. Congestive Heart Failure EF 25%- Volume status has required attention- gave him Lasix 40 X two with good UOP, he has pre-existing bladder outlet obstruction and is very prone to urinary retension which has caused him delirium and infection in the past- at this time leaving a foley in place will be best plan unless it becomes painful for him or a source of infection. Urine is dark and BUN rising- will change lasix to QOD and hold for now. Encourage PO intake and hydration.  Will have ICD turned to off.  5. Hx MRSA Bacteremia with implanted ICD- he is on daily suppression with oral Doxycycline indefinitely- with his current nutritional state and high risk for procedures he is not a candidate for removal and replacement.   6. 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 Would continue as lon is able to take pills.  Prognosis: He remains very fragile, high risk for sepsis or decompensated heart failure. He has a combination of terminal problems - he could have sudden death at anytime or he could continue to make very slow minor improvement which doesn't actually reach his recovery goal. Prognostically his decline will mostly have to do with his PO intake and meeting his basic nutritional requirements and if or how fast his pleural effusion reoccur. His wounds need daily care and attention and he needs to be OOB or turned every 2 hours if any healing is going to happen- or at to them from getting worse.  Disposition: See Family  Note to follow.  35 min Greater than 50%  of this time was spent counseling and coordinating care related to the above assessment and plan.   Acquanetta Chain, DO  02/25/2017, 8:11 AM  Please contact Palliative Medicine Team phone at [phone] for questions and concerns.

## 2017-02-25 NOTE — Progress Notes (Signed)
Palliative Medicine RN Note: Rec'd call from pt's RN. They have called [facility], [facility], and [facility]; none of them have the patient in their care. I called the cardiologist office (Dr Johney Frame); pt has Biotron[employer] device [phone]). Called their office and requested call back from their rep to have device deactivated.  Margret Chance Frantz Quattrone, RN, BSN, [facility] 02/25/2017 1:34 PM Cell [phone] 8:00-4:00 Monday-Friday Office [phone]

## 2017-02-25 NOTE — Patient Care Conference (Addendum)
Family Meeting Goals of Care/Care Planning Re: Disposition  I met with Jacob Pittman, his daughters Jacob Pittman and Jacob Pittman and his spouse (separated not formally divorced but they clearly care for each other but have not lived together for years)  CSW present for most of our discussion.  Jacob Pittman is actively grieving the loss of his function. He is struggling to accept that he will mostly likely not have a complete recovery and that even with the  Improvement he has made since his acute bowel perforation over 2 months ago he has had many setbacks and this is a QOL that he would not want to maintain. He does not want to live in a LTC facility in this condition. He tells me he is ready when it is his time, he became tearful and said "Im not 14 Im not going to bounce back I just need to accept that, but I dont want to give up either". He tells me he feels like he is in a "box". His pain is mostly in his sacral area and it is sharp and "nagging". He has generalized discomfort, any exertion makes him severely SOB.  Over the time he has been in the hospital his family and nurses call him Jacob Pittman" because even when he is suffering (showing a grimace) he will not ask for pain medication because he doesn't want to bother anyone. He asked to pain medication last PM and reported he slept "so good". We discussed his pain and symptom management meds- he needs to medicated prior to wound care.  Meeting Summary:  1. Goal is comfort and QOL above all else and to be cared for with dignity for the time he has left. 2. I discussed all options including a hospice facility- he needs specialized attention to his comfort for drg changes and also has draining pleural fluid from where chest tubes have been removed- may need a colostomy bag for this drainage.Will need pre-medication and symptoms monitored closely. 3. Minimal PO, CHF, Wounds are all prognostically significant could have days to a couple of weeks at  best. 4. Based on our discussion- they are most strongly considering a hospice facility- his daughter is moving into a new house soon and if he stabilizes shewill consider being his 24/7 caregiver with hospice support. 5. We discussed his AICD status and DNR- will be turned offprior to discharge.  Family gave [place] permission to pursue Hospice Facilities for disposition plan.  Lane Hacker, DO Palliative Medicine [phone]  Total Time: 50 minutes Greater than 50%  of this time was spent counseling and coordinating care related to an advance care plan.

## 2017-02-25 NOTE — Consult Note (Signed)
[facility]: Received referral for pt from [employer].  Received information on pt and called daughter to set up meeting time for today. However she stated that the pt's wife would like to be involved in the decision and that she is in dialysis today until this evening. She prefers that we meet in am at 1000 am. I have let Selena Batten also know that family can not meet me today but will meet me in am. Thank you for the referral and the opportunity to work with our community. Norm Parcel RN   [phone]

## 2017-02-25 NOTE — Progress Notes (Signed)
CSW followed up with patient regarding discharge planning and interest in residential hospice. Patient reported that he is interested in residential hospice and deferred to his daughter Miko Lovos) to discuss further. CSW contacted patient's daughter and discussed discharge plans. Patient's daughter expressed interest in [facility] for residential hospice placement . CSW [place] [facility] [place] Liaison Cheri and made referral. She reported no beds available, but patient could still be evaluated and there possibly may be a bed available tomorrow. CSW will continue to follow and assist with discharge planning.  Celso Sickle, [place] Clinical Social Worker [facility] Cell#: [phone]

## 2017-02-25 NOTE — Progress Notes (Signed)
[facility] Hospital Liaison:  RN   Received referral from [place], CSW, but she called back to say no referral at this time.   Thank you.  Adele Barthel, RN, BSN [facility] Hospital Liaison [phone]  All hospital liaisons are now on AMION.

## 2017-02-26 MED ORDER — MIRTAZAPINE 15 MG PO TBDP: 15.0000 mg | ORAL_TABLET | Freq: Every day | ORAL | Status: AC

## 2017-02-26 MED ORDER — SENNOSIDES-DOCUSATE SODIUM 8.6-50 MG PO TABS
1.0000 | ORAL_TABLET | Freq: Every day | ORAL | Status: AC
Start: 2017-02-26 — End: ?

## 2017-02-26 MED ORDER — HYDROMORPHONE HCL 1 MG/ML IJ SOLN: 0.2500 mg | INTRAMUSCULAR | 0 refills | Status: AC | PRN

## 2017-02-26 MED ORDER — FAMOTIDINE 40 MG PO TABS: 40.0000 mg | ORAL_TABLET | Freq: Every day | ORAL | Status: AC

## 2017-02-26 MED ORDER — HYDROMORPHONE HCL 2 MG PO TABS: 2.0000 mg | ORAL_TABLET | Freq: Every day | ORAL | 0 refills | Status: AC

## 2017-02-26 MED ORDER — LORAZEPAM 2 MG/ML IJ SOLN: 0.5000 mg | INTRAMUSCULAR | 0 refills | Status: AC | PRN

## 2017-02-26 NOTE — Discharge Summary (Signed)
Physician Discharge Summary  Patient ID: Jacob Pittman MRN: [id number] DOB/AGE: 1946-01-10 71 y.o.  Admit date: 02/19/2017 Discharge date: 02/26/2017  Discharge Diagnoses:  Principal Problem:   Complication of chest tube Active Problems:   Bladder neck obstruction   Acute on chronic combined systolic and diastolic CHF (congestive heart failure) (HCC)   Nonischemic cardiomyopathy (HCC)   Chronic combined systolic and diastolic heart failure, NYHA class 3 ([place])   ICD (implantable cardioverter-defibrillator), single, in situ   Peripheral arterial disease (HCC)   Dyspnea   Perforated duodenal bulb ulcer (HCC)   Paroxysmal A-fib (HCC)   Pressure sore on heel, left, unstageable (HCC)   Poor venous access   Palliative care encounter   Goals of care, counseling/discussion   Critical illness polyneuropathy ([place])   Complicated wound infection   Pleural effusion, left   Protein-energy malnutrition ([place])   Ostomy nurse consultation   Decubitus ulcer of sacral region, stage 4 ([place])   Chest tube in place   Hospital Course:   71 y/o male had a prolonged hospitalization in 11/2016 through 12/2016 at [facility] from a perforated gastric ulcer. He had many compilations including respiratory failure, MRSA bacteremia, delirium, and heart failure.He was discharged to [facility] for wound care and continued medical management. He was admitted back to [facility] on 11/2 for definitive management of bilateral chest tubes, complex and complicated wounds and planned transition to hospice care.Met with patient and daughter on 11/2 discussed goals of care. Right Chest tube removed 11/4, Left chest tube removed 11/5. [place] Consult 11/5- his abdominal wound is much improved,no Eakins pouch. Tx for thrush started 11/6. Chest tube sites continued to drain large amounts of fluid, pulm came to evaluate on 11/7 and placed occlusive drg. He continued to have very poor appetite and stated his goals of care to be comfort so a  referral to a hospice facility was made. His Lasix dose was reduced due to poor PO intake and hypotension. He has been receiving PRN and PM scheduled hydromorphone for pain mostly related to his stage 4 sacral decubitus. He requires turning every 2 hours for comfort. His AICD was deactivated on 11/8.   I discussed the plan of care in detail with the patient and his two daughter [name] and [name]. All are in agreement. His prognosis is in general very poor, I anticipate a fairly rapid decline.   Consults:  Pulmonology for Chest Tube Management. Wound and Ostomy Care Nurse  Significant Diagnostic Studies:  Chest X-Rays for pleural effusions monitoring/management  Treatments:  Removal of Bilateral Chest Tubes Wound Care and Evaluation  Discharge Exam: Blood pressure 105/60, pulse 70, temperature 98.9 F (37.2 C), temperature source Oral, resp. rate 20, height 6' 1.2" (1.859 m), weight 83.5 kg (184 lb), SpO2 100 %. Alert, oriented, appears weak No Distress Decreased breath sounds, bilateral chest tubes dressings, ABD required and vaseline Gauze Irregular HR Dressing on abdomen intact, +BS +anasarca-left arm is swollen Bilateral Prevalon boots Foley Catheter in place   Disposition: Hospice Home [place]  Discharge Instructions    Activity as tolerated - No restrictions   Complete by:  As directed    Diet general   Complete by:  As directed      Allergies as of 02/26/2017   No Known Allergies     Medication List    STOP taking these medications   feeding supplement (ENSURE ENLIVE) Liqd   fluticasone 50 MCG/ACT nasal spray Commonly known as:  FLONASE   glucose blood test  strip   Lancets Misc   midodrine 2.5 MG tablet Commonly known as:  PROAMATINE   ONE TOUCH ULTRA SYSTEM KIT w/Device Kit   OxyCODONE HCl (Abuse Deter) 5 MG Taba Commonly known as:  OXAYDO   pantoprazole 40 MG tablet Commonly known as:  PROTONIX   rosuvastatin 20 MG tablet Commonly known as:   CRESTOR     TAKE these medications   acetaminophen 500 MG tablet Commonly known as:  TYLENOL Take 2 tablets (1,000 mg total) by mouth 3 (three) times daily.   albuterol 108 (90 Base) MCG/ACT inhaler Commonly known as:  PROVENTIL HFA;VENTOLIN HFA Inhale 2 puffs into the lungs every 6 (six) hours as needed for wheezing or shortness of breath.   apixaban 5 MG Tabs tablet Commonly known as:  ELIQUIS Take 1 tablet (5 mg total) by mouth every 12 (twelve) hours.   collagenase ointment Commonly known as:  SANTYL Apply topically daily.   docusate sodium 100 MG capsule Commonly known as:  COLACE Take 1 capsule (100 mg total) by mouth daily as needed for mild constipation.   doxycycline 100 MG tablet Commonly known as:  VIBRA-TABS Take 1 tablet (100 mg total) by mouth every 12 (twelve) hours.   famotidine 40 MG tablet Commonly known as:  PEPCID Take 1 tablet (40 mg total) daily by mouth. Start taking on:  02/27/2017   furosemide 20 MG tablet Commonly known as:  LASIX Take 10 mg by mouth daily.   HYDROmorphone 1 MG/ML injection Commonly known as:  DILAUDID Inject 0.25 mLs (0.25 mg total) every 4 (four) hours as needed into the vein for moderate pain (Dyspnea-respirations >25).   HYDROmorphone 2 MG tablet Commonly known as:  DILAUDID Take 1 tablet (2 mg total) at bedtime by mouth.   LORazepam 2 MG/ML injection Commonly known as:  ATIVAN Inject 0.25 mLs (0.5 mg total) every 4 (four) hours as needed into the vein for anxiety.   mirtazapine 15 MG disintegrating tablet Commonly known as:  REMERON SOL-TAB Take 1 tablet (15 mg total) at bedtime by mouth.   ondansetron 4 MG disintegrating tablet Commonly known as:  ZOFRAN-ODT Take 1 tablet (4 mg total) by mouth every 6 (six) hours as needed for nausea.   senna-docusate 8.6-50 MG tablet Commonly known as:  Senokot-S Take 1 tablet at bedtime by mouth.      35 minutes Greater than 50%  of this time was spent counseling and  coordinating care related to the above assessment and plan.  SignedLane Hacker 02/26/2017, 1:21 PM

## 2017-02-26 NOTE — Progress Notes (Signed)
Report called to [facility] . All questions and concerns addressed. Patient transported via [facility].

## 2017-02-26 NOTE — Progress Notes (Signed)
PT Cancellation Note  Patient Details Name: Rayshard Abbatiello MRN: [id number] DOB: 06-14-1945   Cancelled Treatment:    Reason Eval/Treat Not Completed: Other  Pt will sign off at this time, noted goal is for comfort care per Palliative  Care Team Drucilla Chalet, [place] Pager: [phone] 02/26/2017    [facility] 02/26/2017, 9:09 AM

## 2017-02-26 NOTE — Consult Note (Signed)
[facility]: Discussion with pt and his daughter Vikki Ports. The pt clear ly understands where he is in his disease progression and is receptive to a full comfort approach at [facility]. Discussed the hospice philosophy and the Hospice home service agreement with th ept and Vikki Ports. The pt request Vikki Ports to sign the documents for him. Joni Reining made aware of the bed offer and that family and pt accepted it. He will transfer to Hospice home in [facility] when discharge completed by ambulance. Norm Parcel RN [phone]

## 2017-02-26 NOTE — Progress Notes (Addendum)
Patient will discharge to [facility] today. CSW completed Med. Nes. Form  DNR placed on chart to be completed by physician.  1:52PM [facility] scheduled for transport.  Liaison provided nurse w/number for report.   Vivi Barrack, Theresia Majors, MSW Clinical Social Worker  [phone] 02/26/2017  12:03 PM

## 2017-03-17 ENCOUNTER — Ambulatory Visit: Payer: Medicare PPO | Admitting: Internal Medicine

## 2017-03-20 DEATH — deceased

## 2017-04-01 ENCOUNTER — Telehealth: Payer: Self-pay | Admitting: Internal Medicine

## 2017-04-01 NOTE — Telephone Encounter (Signed)
Copied from CRM [id number]. Topic: General - Deceased Patient >> 04/22/2017 10:55 AM Waymon Amato wrote: Reason for CRM: pt daughter is calling to let us know that patient has passed so she will not receive mail to his address  Route to department's PEC Pool.

## 2017-10-07 ENCOUNTER — Telehealth: Payer: Self-pay | Admitting: Cardiology

## 2017-10-07 NOTE — Telephone Encounter (Signed)
LMOVM for pt to return call 

## 2018-03-28 IMAGING — DX DG CHEST 1V PORT
1 series · 1 of 1 positions shown · non-contrast
Comparison: 12/19/2016, 12/16/2016 and earlier, including CT chest
11/27/2016.

CLINICAL DATA: Fever.  Follow-up pulmonary edema.

EXAM:
PORTABLE CHEST 1 VIEW

[chest ap]
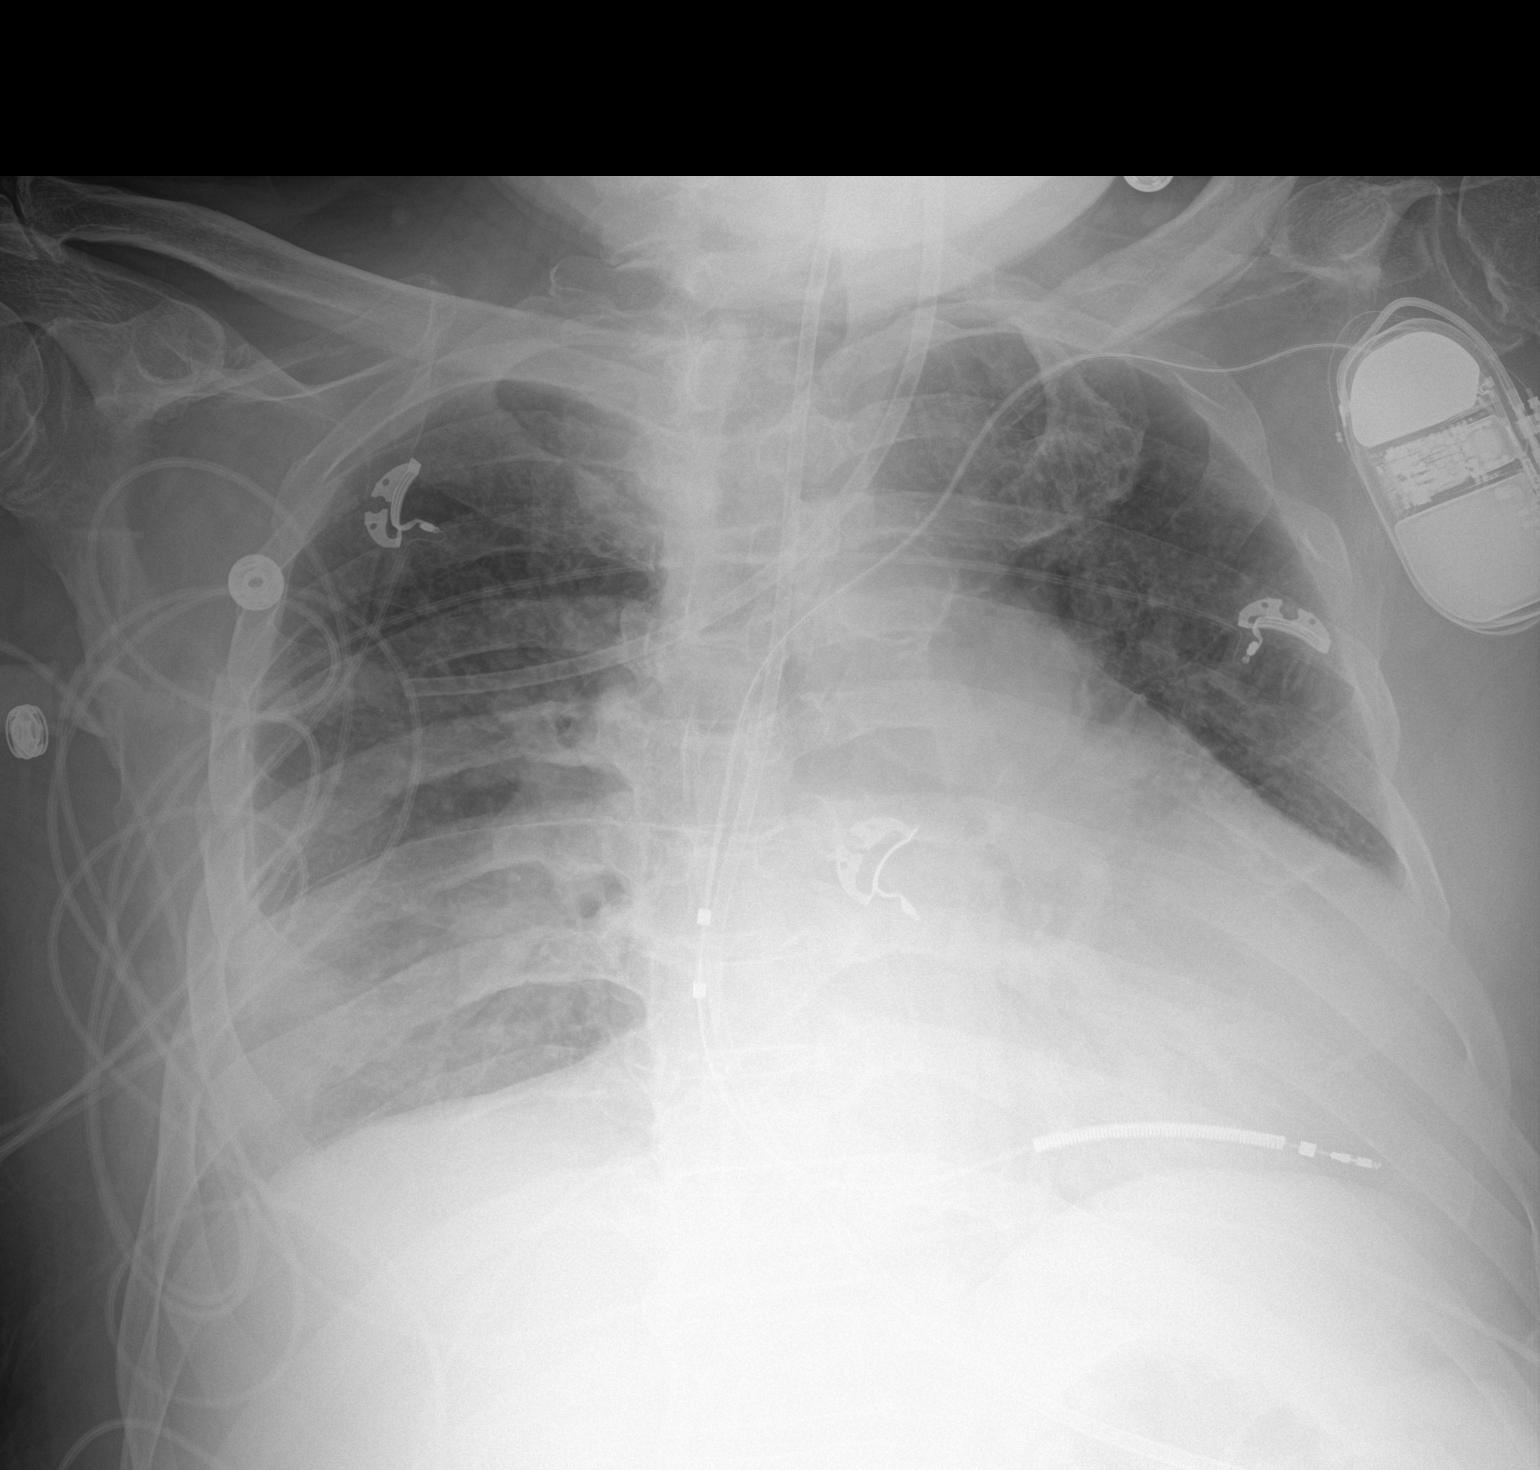

[1 of 1 positions shown; findings below may reference images not displayed]

FINDINGS: Cardiac silhouette markedly enlarged, unchanged. Worsening
interstitial pulmonary edema since the examination 2 days ago. New
consolidation at the right lung base. Bilateral pleural effusions
suspected.

Feeding tube courses below the diaphragm into the stomach. Left
subclavian single lead transvenous pacemaker unchanged and appears
intact.
IMPRESSION: 1. Worsening CHF and/or fluid overload, with increasing interstitial
pulmonary edema and small bilateral pleural effusions.
2. New atelectasis versus pneumonia at the right lung base.
Pneumonia is favored, query aspiration.

## 2018-03-29 IMAGING — DX DG CHEST 1V PORT
1 series · 1 of 1 positions shown · non-contrast
Comparison: 12/21/2016

CLINICAL DATA: Shortness of Breath

EXAM:
PORTABLE CHEST 1 VIEW

[chest ap]
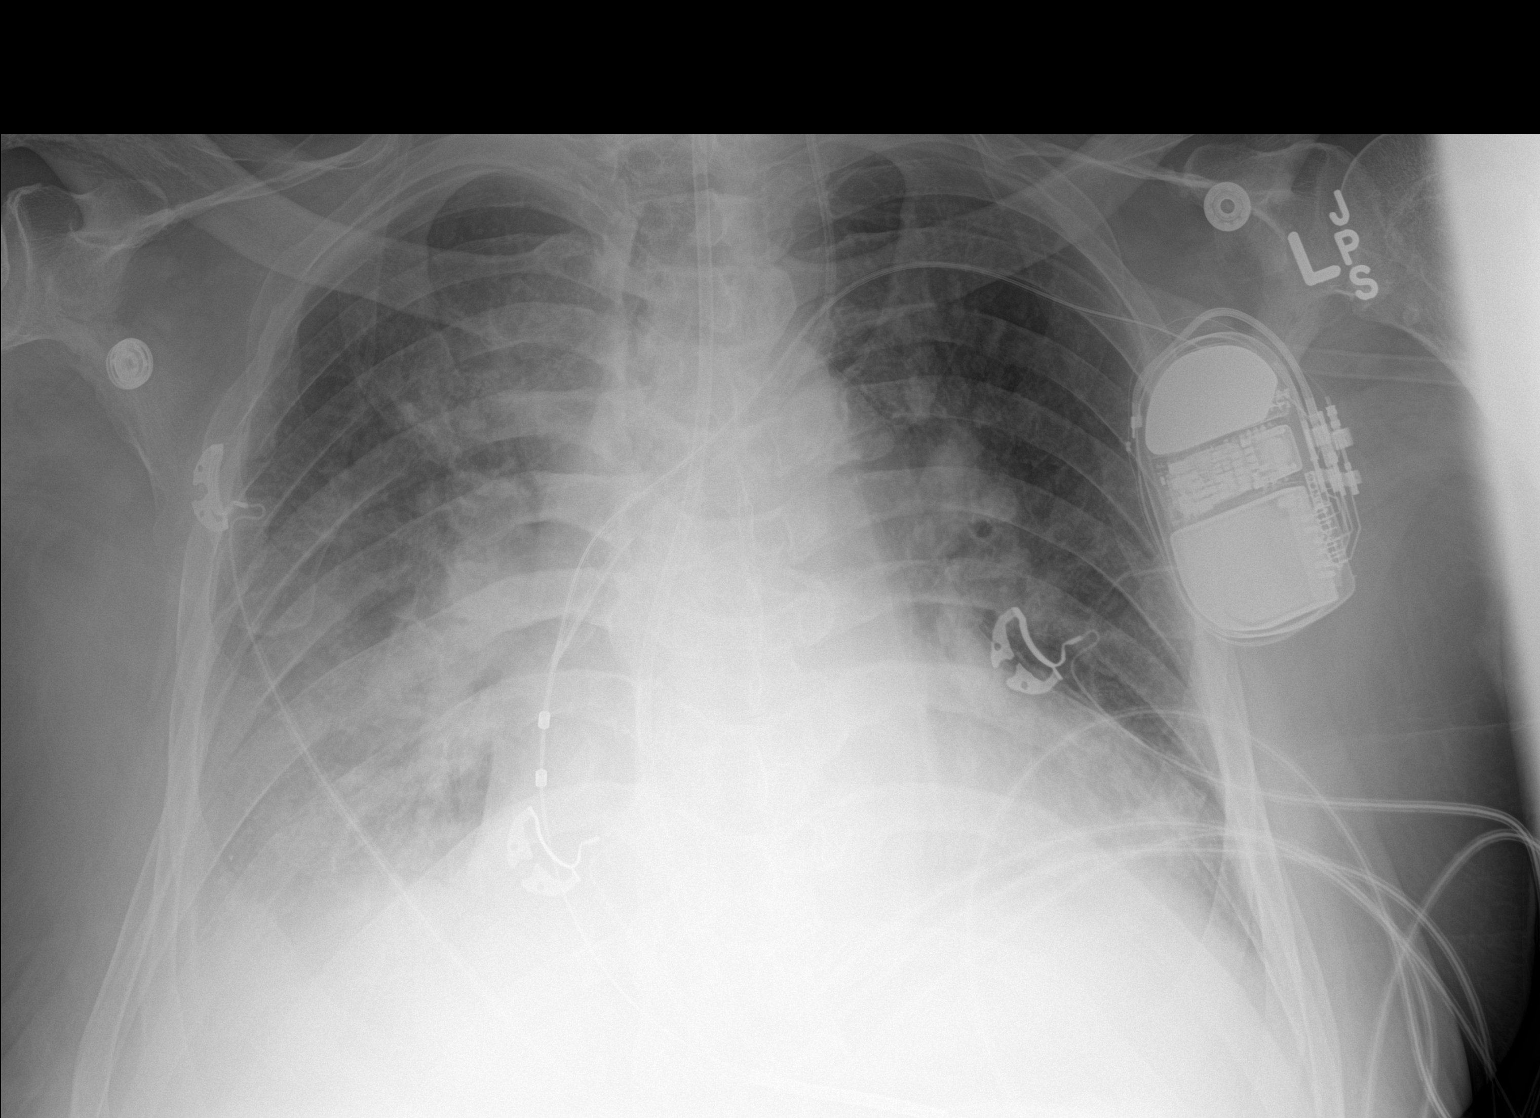

[1 of 1 positions shown; findings below may reference images not displayed]

FINDINGS: Left AICD remains in place, unchanged. Cardiomegaly with bilateral
airspace opacities, right greater than left. Small bilateral
effusions. Overall air perforation and the right lung worsened since
prior study.
IMPRESSION: Worsening bilateral airspace disease, right greater than left which
could represent asymmetric edema or infection.

Small effusions.

## 2018-03-31 IMAGING — CT CT IMAGE GUIDED DRAINAGE BY PERCUTANEOUS CATHETER
1 of 3 series · 12 of 32 positions shown, 18 images · non-contrast
Comparison: none

INDICATION: 71-year-old male with postoperative intra-abdominal fluid collection
and spontaneous drainage from his midline wound.
TECHNIQUE: Informed written consent was obtained from the patient after a
thorough discussion of the procedural risks, benefits and
alternatives. All questions were addressed. A timeout was performed
prior to the initiation of the procedure.

[Series 6: i-spiral 5.0 b40f · axial · 0.96mm/px · z∈[+1117,+1257]mm · 12 of 48 slices shown, 18 images]
[im 4/48  soft-tissue]
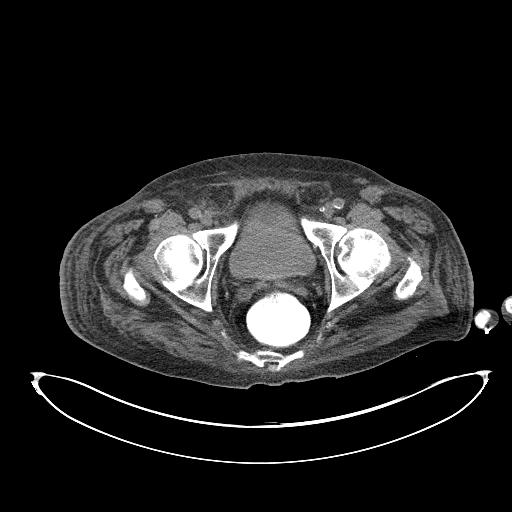
[im 4/48  bone]
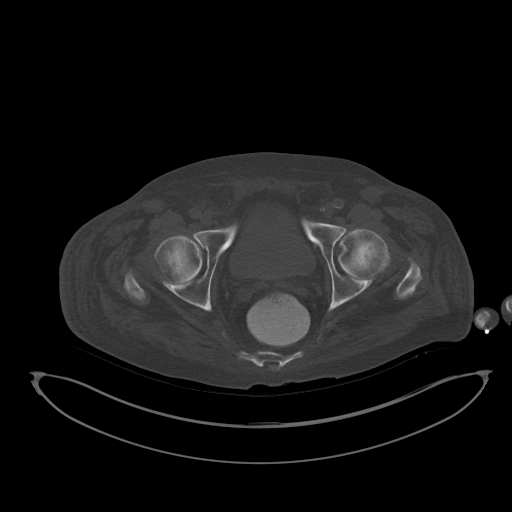
[im 7/48  soft-tissue]
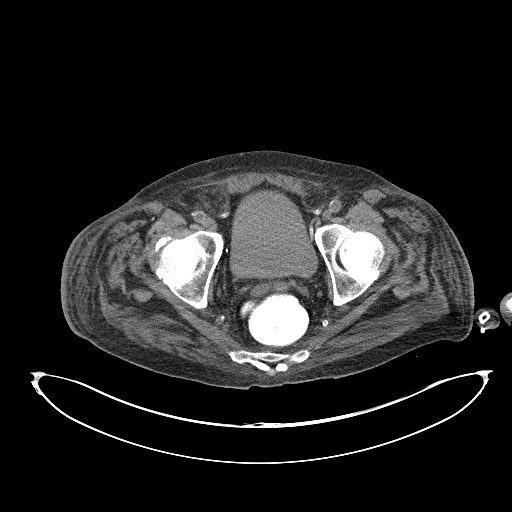
[im 11/48  soft-tissue]
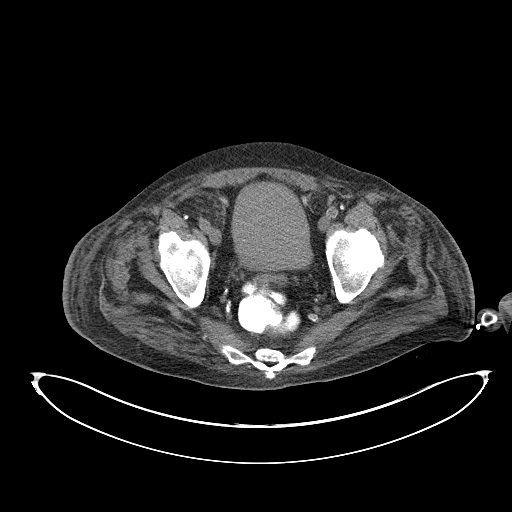
[im 14/48  soft-tissue]
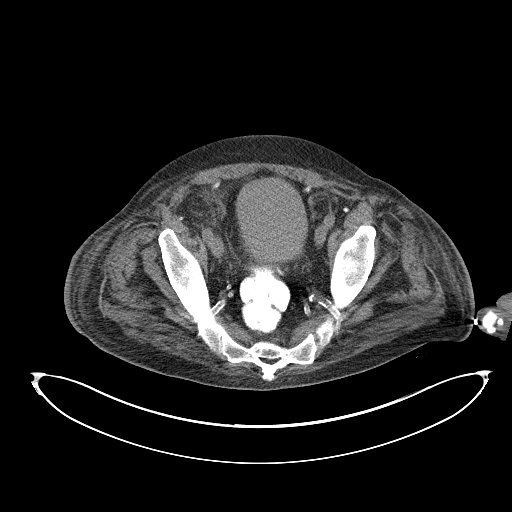
[im 17/48  soft-tissue]
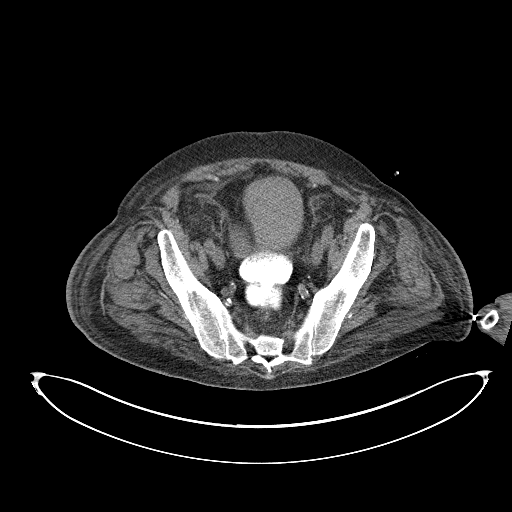
[im 21/48  soft-tissue]
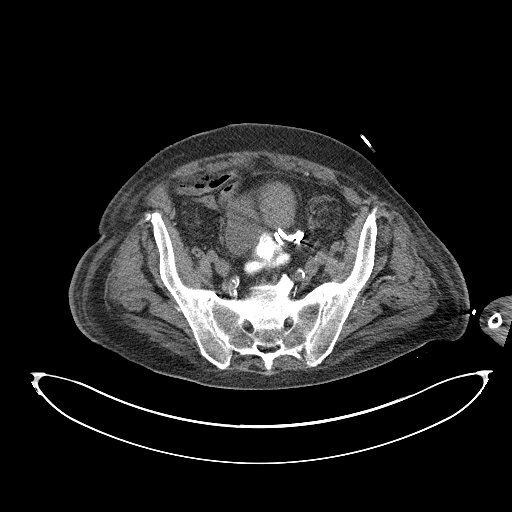
[im 27/48  soft-tissue]
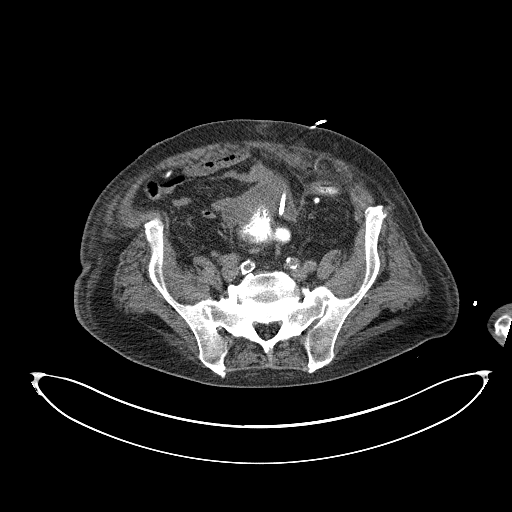
[im 31/48  soft-tissue]
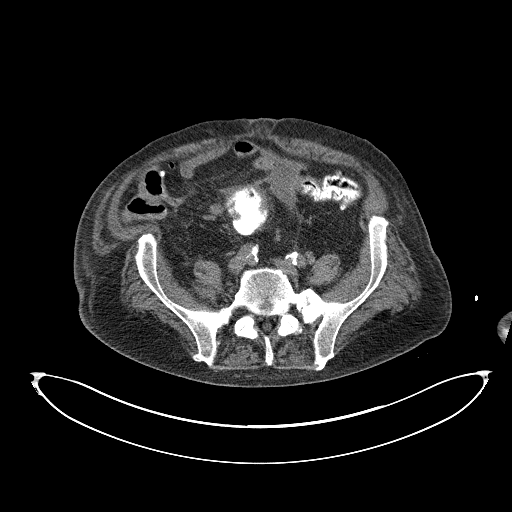
[im 34/48  soft-tissue]
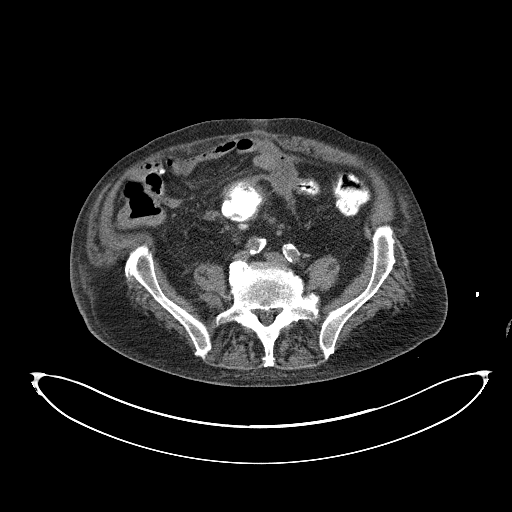
[im 34/48  lung]
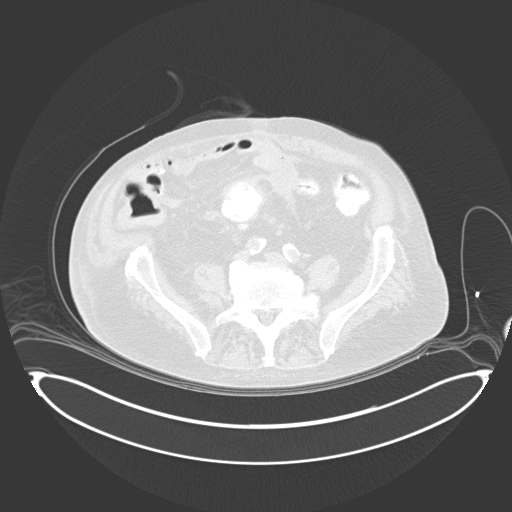
[im 34/48  bone]
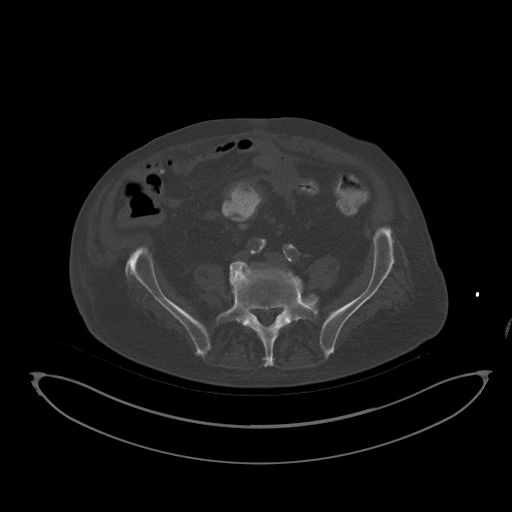
[im 37/48  soft-tissue]
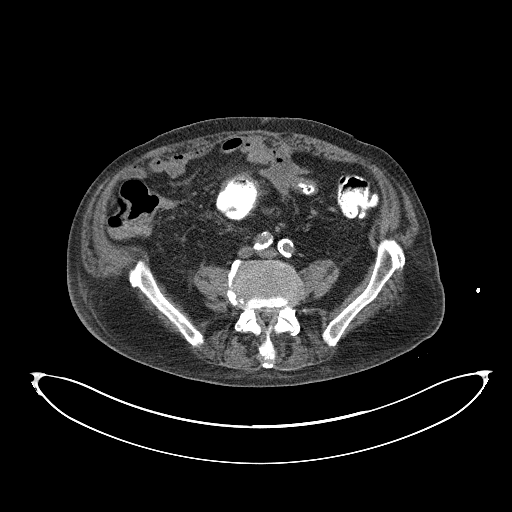
[im 37/48  lung]
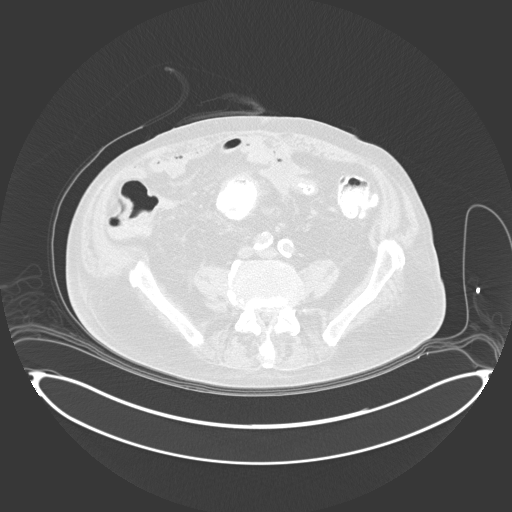
[im 41/48  soft-tissue]
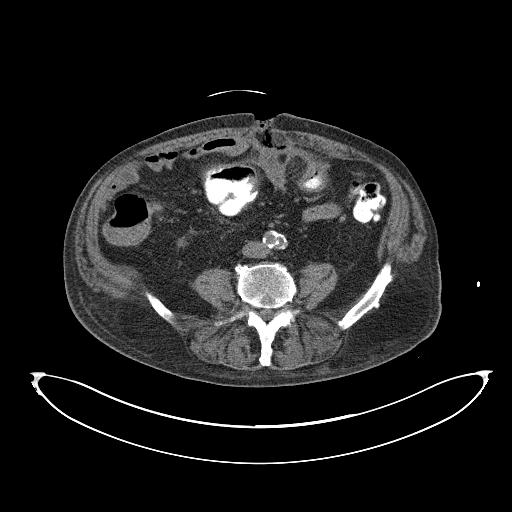
[im 41/48  lung]
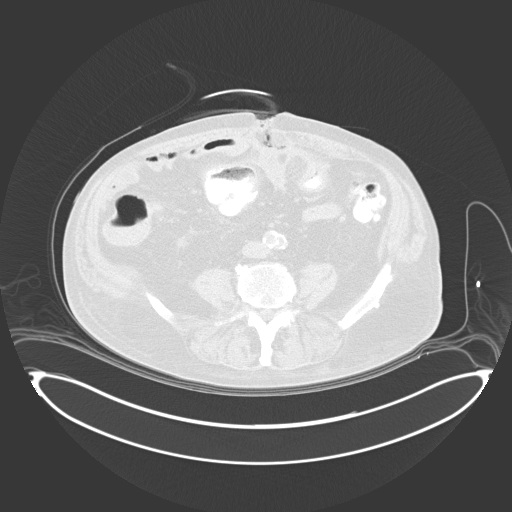
[im 44/48  soft-tissue]
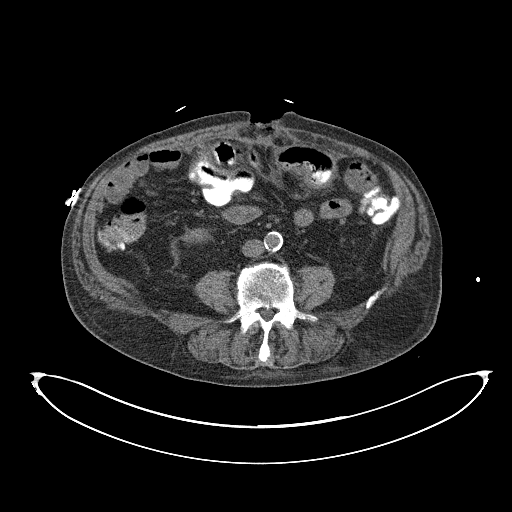
[im 44/48  lung]
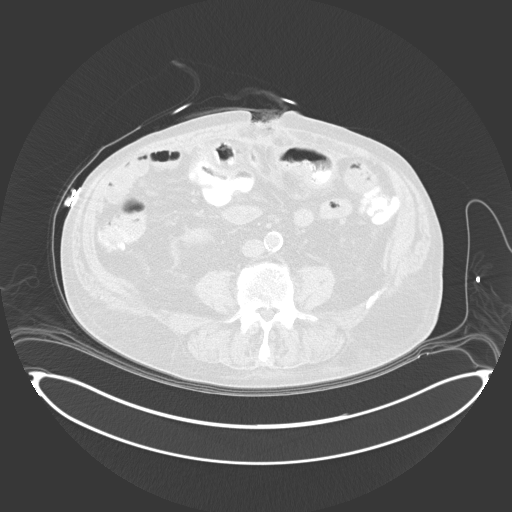

[12 of 32 positions shown; findings below may reference images not displayed]

EXAM:
CT GUIDED DRAINAGE OF  ABSCESS

MEDICATIONS:
The patient is currently admitted to the hospital and receiving
intravenous antibiotics. The antibiotics were administered within an
appropriate time frame prior to the initiation of the procedure.

ANESTHESIA/SEDATION:
1 mg IV Versed 50 mcg IV Fentanyl

Moderate Sedation Time:  15 minutes

The patient was continuously monitored during the procedure by the
interventional radiology nurse under my direct supervision.

COMPLICATIONS:
None immediate.
PROCEDURE:
A planning axial CT scan was performed. The intra-abdominal fluid
collection is significantly decreased in size compared to 12/21/2016
likely due to continued drainage through the midline abdominal
wound. There is still sufficient fluid to warrant drain placement.
Therefore, the overlying skin was marked and sterilely prepped and
draped in standard fashion using chlorhexidine skin prep. Local
anesthesia was attained by infiltration with 1% lidocaine. Under
intermittent CT guidance, a 18 gauge trocar needle was advanced into
the fluid collection. A 0.035 wire was then advanced the needle and
coiled within the fluid collection. The skin tract was dilated to 10
French and See Castello 10.2 French all-purpose drainage catheter was
advanced over the wire and positioned in the fluid collection.
Aspiration yields approximately 10 mL of serosanguineous fluid. No
purulence or foul odor. The catheter was secured to the skin with 0
Prolene suture and connected to JP bulb suction.
FINDINGS: Serosanguineous fluid.
IMPRESSION: Successful placement of a 10 French drainage catheter. Aspiration
yields serosanguineous fluid. A sample was sent for Gram stain and
culture.

PLAN:
Maintain tube to JP bulb suction and flush every shift. Recommend
repeat CT scan of the abdomen and pelvis prior to drain removal.
